# Patient Record
Sex: Female | Born: 1935
Health system: Southern US, Community
[De-identification: ages and names within clinical notes are randomized; demographics above are authoritative.]

## PROBLEM LIST (undated history)

## (undated) DIAGNOSIS — J189 Pneumonia, unspecified organism: Secondary | ICD-10-CM

## (undated) DIAGNOSIS — I4510 Unspecified right bundle-branch block: Secondary | ICD-10-CM

## (undated) DIAGNOSIS — Z9889 Other specified postprocedural states: Secondary | ICD-10-CM

## (undated) DIAGNOSIS — R112 Nausea with vomiting, unspecified: Secondary | ICD-10-CM

## (undated) DIAGNOSIS — M1990 Unspecified osteoarthritis, unspecified site: Secondary | ICD-10-CM

## (undated) DIAGNOSIS — Z923 Personal history of irradiation: Secondary | ICD-10-CM

## (undated) DIAGNOSIS — I4891 Unspecified atrial fibrillation: Secondary | ICD-10-CM

## (undated) DIAGNOSIS — I209 Angina pectoris, unspecified: Secondary | ICD-10-CM

## (undated) DIAGNOSIS — C801 Malignant (primary) neoplasm, unspecified: Secondary | ICD-10-CM

## (undated) DIAGNOSIS — Z9289 Personal history of other medical treatment: Secondary | ICD-10-CM

## (undated) DIAGNOSIS — T4145XA Adverse effect of unspecified anesthetic, initial encounter: Secondary | ICD-10-CM

## (undated) DIAGNOSIS — T148XXA Other injury of unspecified body region, initial encounter: Secondary | ICD-10-CM

## (undated) DIAGNOSIS — K660 Peritoneal adhesions (postprocedural) (postinfection): Secondary | ICD-10-CM

## (undated) DIAGNOSIS — E7800 Pure hypercholesterolemia, unspecified: Secondary | ICD-10-CM

## (undated) DIAGNOSIS — N63 Unspecified lump in unspecified breast: Secondary | ICD-10-CM

## (undated) DIAGNOSIS — C50919 Malignant neoplasm of unspecified site of unspecified female breast: Secondary | ICD-10-CM

## (undated) DIAGNOSIS — T8859XA Other complications of anesthesia, initial encounter: Secondary | ICD-10-CM

## (undated) DIAGNOSIS — R0600 Dyspnea, unspecified: Secondary | ICD-10-CM

## (undated) DIAGNOSIS — Z90710 Acquired absence of both cervix and uterus: Secondary | ICD-10-CM

## (undated) DIAGNOSIS — R141 Gas pain: Secondary | ICD-10-CM

## (undated) DIAGNOSIS — D649 Anemia, unspecified: Secondary | ICD-10-CM

## (undated) DIAGNOSIS — I340 Nonrheumatic mitral (valve) insufficiency: Secondary | ICD-10-CM

## (undated) DIAGNOSIS — E039 Hypothyroidism, unspecified: Secondary | ICD-10-CM

## (undated) DIAGNOSIS — M779 Enthesopathy, unspecified: Secondary | ICD-10-CM

## (undated) DIAGNOSIS — K219 Gastro-esophageal reflux disease without esophagitis: Secondary | ICD-10-CM

## (undated) DIAGNOSIS — E119 Type 2 diabetes mellitus without complications: Secondary | ICD-10-CM

## (undated) DIAGNOSIS — R1013 Epigastric pain: Secondary | ICD-10-CM

## (undated) HISTORY — PX: LIPOMA EXCISION: SHX5283

## (undated) HISTORY — DX: Hypothyroidism, unspecified: E03.9

## (undated) HISTORY — DX: Acquired absence of both cervix and uterus: Z90.710

## (undated) HISTORY — PX: BLADDER REPAIR: SHX76

## (undated) HISTORY — DX: Enthesopathy, unspecified: M77.9

## (undated) HISTORY — PX: ABDOMINAL HYSTERECTOMY: SHX81

## (undated) HISTORY — DX: Unspecified lump in unspecified breast: N63.0

## (undated) HISTORY — DX: Pure hypercholesterolemia, unspecified: E78.00

## (undated) HISTORY — PX: BREAST REDUCTION SURGERY: SHX8

## (undated) HISTORY — DX: Type 2 diabetes mellitus without complications: E11.9

## (undated) HISTORY — DX: Gastro-esophageal reflux disease without esophagitis: K21.9

## (undated) HISTORY — DX: Epigastric pain: R10.13

## (undated) HISTORY — PX: SALPINGOOPHORECTOMY: SHX82

## (undated) HISTORY — DX: Malignant (primary) neoplasm, unspecified: C80.1

## (undated) HISTORY — PX: RETINAL DETACHMENT SURGERY: SHX105

## (undated) HISTORY — DX: Gas pain: R14.1

---
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
## 1976-07-06 DIAGNOSIS — Z90710 Acquired absence of both cervix and uterus: Secondary | ICD-10-CM

## 1976-07-06 HISTORY — PX: APPENDECTOMY: SHX54

## 1976-07-06 HISTORY — DX: Acquired absence of both cervix and uterus: Z90.710

## 1996-07-06 HISTORY — PX: REDUCTION MAMMAPLASTY: SUR839

## 1997-08-30 ENCOUNTER — Ambulatory Visit (HOSPITAL_COMMUNITY): Admission: RE | Admit: 1997-08-30 | Discharge: 1997-08-30 | Payer: Self-pay | Admitting: Plastic Surgery

## 1997-10-12 ENCOUNTER — Emergency Department (HOSPITAL_COMMUNITY): Admission: EM | Admit: 1997-10-12 | Discharge: 1997-10-12 | Payer: Self-pay | Admitting: Emergency Medicine

## 1997-11-01 ENCOUNTER — Other Ambulatory Visit: Admission: RE | Admit: 1997-11-01 | Discharge: 1997-11-01 | Payer: Self-pay | Admitting: Cardiology

## 1998-01-25 ENCOUNTER — Other Ambulatory Visit: Admission: RE | Admit: 1998-01-25 | Discharge: 1998-01-25 | Payer: Self-pay | Admitting: Cardiology

## 1998-08-29 ENCOUNTER — Encounter: Payer: Self-pay | Admitting: Obstetrics & Gynecology

## 1998-08-29 ENCOUNTER — Ambulatory Visit (HOSPITAL_COMMUNITY): Admission: RE | Admit: 1998-08-29 | Discharge: 1998-08-29 | Payer: Self-pay | Admitting: Obstetrics & Gynecology

## 1999-04-07 ENCOUNTER — Other Ambulatory Visit: Admission: RE | Admit: 1999-04-07 | Discharge: 1999-04-07 | Payer: Self-pay | Admitting: Obstetrics & Gynecology

## 1999-09-02 ENCOUNTER — Encounter: Payer: Self-pay | Admitting: Obstetrics & Gynecology

## 1999-09-02 ENCOUNTER — Ambulatory Visit (HOSPITAL_COMMUNITY): Admission: RE | Admit: 1999-09-02 | Discharge: 1999-09-02 | Payer: Self-pay | Admitting: Obstetrics & Gynecology

## 2000-05-14 ENCOUNTER — Other Ambulatory Visit: Admission: RE | Admit: 2000-05-14 | Discharge: 2000-05-14 | Payer: Self-pay | Admitting: Obstetrics & Gynecology

## 2000-09-07 ENCOUNTER — Encounter: Payer: Self-pay | Admitting: Obstetrics & Gynecology

## 2000-09-07 ENCOUNTER — Ambulatory Visit (HOSPITAL_COMMUNITY): Admission: RE | Admit: 2000-09-07 | Discharge: 2000-09-07 | Payer: Self-pay | Admitting: Obstetrics & Gynecology

## 2001-04-07 ENCOUNTER — Ambulatory Visit (HOSPITAL_COMMUNITY): Admission: RE | Admit: 2001-04-07 | Discharge: 2001-04-07 | Payer: Self-pay | Admitting: *Deleted

## 2001-06-22 ENCOUNTER — Other Ambulatory Visit: Admission: RE | Admit: 2001-06-22 | Discharge: 2001-06-22 | Payer: Self-pay | Admitting: Obstetrics & Gynecology

## 2001-09-12 ENCOUNTER — Ambulatory Visit (HOSPITAL_COMMUNITY): Admission: RE | Admit: 2001-09-12 | Discharge: 2001-09-12 | Payer: Self-pay | Admitting: Obstetrics & Gynecology

## 2001-09-12 ENCOUNTER — Encounter: Payer: Self-pay | Admitting: Obstetrics & Gynecology

## 2003-07-30 ENCOUNTER — Other Ambulatory Visit: Admission: RE | Admit: 2003-07-30 | Discharge: 2003-07-30 | Payer: Self-pay | Admitting: Obstetrics & Gynecology

## 2003-08-29 ENCOUNTER — Encounter: Admission: RE | Admit: 2003-08-29 | Discharge: 2003-08-29 | Payer: Self-pay | Admitting: Neurology

## 2003-08-31 ENCOUNTER — Encounter: Admission: RE | Admit: 2003-08-31 | Discharge: 2003-08-31 | Payer: Self-pay | Admitting: Neurology

## 2004-07-31 ENCOUNTER — Encounter: Admission: RE | Admit: 2004-07-31 | Discharge: 2004-07-31 | Payer: Self-pay | Admitting: Obstetrics & Gynecology

## 2004-09-15 ENCOUNTER — Encounter: Admission: RE | Admit: 2004-09-15 | Discharge: 2004-09-15 | Payer: Self-pay | Admitting: Obstetrics & Gynecology

## 2005-03-18 ENCOUNTER — Encounter: Admission: RE | Admit: 2005-03-18 | Discharge: 2005-03-18 | Payer: Self-pay | Admitting: Obstetrics & Gynecology

## 2005-08-13 ENCOUNTER — Other Ambulatory Visit: Admission: RE | Admit: 2005-08-13 | Discharge: 2005-08-13 | Payer: Self-pay | Admitting: Obstetrics & Gynecology

## 2005-09-17 ENCOUNTER — Encounter: Admission: RE | Admit: 2005-09-17 | Discharge: 2005-09-17 | Payer: Self-pay | Admitting: Obstetrics & Gynecology

## 2006-06-03 ENCOUNTER — Encounter: Admission: RE | Admit: 2006-06-03 | Discharge: 2006-06-03 | Payer: Self-pay | Admitting: Cardiology

## 2006-07-13 ENCOUNTER — Ambulatory Visit (HOSPITAL_COMMUNITY): Admission: RE | Admit: 2006-07-13 | Discharge: 2006-07-13 | Payer: Self-pay | Admitting: Gastroenterology

## 2006-10-11 ENCOUNTER — Encounter: Admission: RE | Admit: 2006-10-11 | Discharge: 2006-10-11 | Payer: Self-pay | Admitting: Obstetrics & Gynecology

## 2006-10-19 ENCOUNTER — Encounter: Admission: RE | Admit: 2006-10-19 | Discharge: 2006-10-19 | Payer: Self-pay | Admitting: Obstetrics & Gynecology

## 2007-06-28 ENCOUNTER — Ambulatory Visit (HOSPITAL_COMMUNITY): Admission: RE | Admit: 2007-06-28 | Discharge: 2007-06-28 | Payer: Self-pay | Admitting: Obstetrics & Gynecology

## 2007-10-28 ENCOUNTER — Encounter: Admission: RE | Admit: 2007-10-28 | Discharge: 2007-10-28 | Payer: Self-pay | Admitting: Cardiology

## 2007-10-31 ENCOUNTER — Ambulatory Visit (HOSPITAL_COMMUNITY): Admission: RE | Admit: 2007-10-31 | Discharge: 2007-10-31 | Payer: Self-pay | Admitting: Obstetrics & Gynecology

## 2007-12-20 ENCOUNTER — Encounter: Admission: RE | Admit: 2007-12-20 | Discharge: 2007-12-20 | Payer: Self-pay | Admitting: Cardiology

## 2009-09-06 ENCOUNTER — Encounter: Admission: RE | Admit: 2009-09-06 | Discharge: 2009-09-06 | Payer: Self-pay | Admitting: Cardiology

## 2009-10-25 ENCOUNTER — Emergency Department (HOSPITAL_COMMUNITY): Admission: EM | Admit: 2009-10-25 | Discharge: 2009-10-25 | Payer: Self-pay | Admitting: Emergency Medicine

## 2010-05-09 ENCOUNTER — Ambulatory Visit: Payer: Self-pay | Admitting: Cardiology

## 2010-05-12 ENCOUNTER — Ambulatory Visit: Payer: Self-pay | Admitting: Cardiology

## 2010-07-26 ENCOUNTER — Encounter: Payer: Self-pay | Admitting: Neurology

## 2010-09-09 ENCOUNTER — Emergency Department (INDEPENDENT_AMBULATORY_CARE_PROVIDER_SITE_OTHER): Payer: MEDICARE

## 2010-09-09 ENCOUNTER — Emergency Department (HOSPITAL_BASED_OUTPATIENT_CLINIC_OR_DEPARTMENT_OTHER)
Admission: EM | Admit: 2010-09-09 | Discharge: 2010-09-09 | Disposition: A | Discharge status: Home / Self Care | Payer: MEDICARE | Attending: Emergency Medicine | Admitting: Emergency Medicine

## 2010-09-09 DIAGNOSIS — R109 Unspecified abdominal pain: Secondary | ICD-10-CM

## 2010-09-09 DIAGNOSIS — R1031 Right lower quadrant pain: Secondary | ICD-10-CM | POA: Insufficient documentation

## 2010-09-09 DIAGNOSIS — R319 Hematuria, unspecified: Secondary | ICD-10-CM | POA: Insufficient documentation

## 2010-09-09 DIAGNOSIS — K5730 Diverticulosis of large intestine without perforation or abscess without bleeding: Secondary | ICD-10-CM | POA: Insufficient documentation

## 2010-09-09 DIAGNOSIS — Z79899 Other long term (current) drug therapy: Secondary | ICD-10-CM | POA: Insufficient documentation

## 2010-09-09 DIAGNOSIS — I2510 Atherosclerotic heart disease of native coronary artery without angina pectoris: Secondary | ICD-10-CM | POA: Insufficient documentation

## 2010-09-09 DIAGNOSIS — E7800 Pure hypercholesterolemia, unspecified: Secondary | ICD-10-CM | POA: Insufficient documentation

## 2010-09-09 DIAGNOSIS — R112 Nausea with vomiting, unspecified: Secondary | ICD-10-CM

## 2010-09-09 DIAGNOSIS — N390 Urinary tract infection, site not specified: Secondary | ICD-10-CM | POA: Insufficient documentation

## 2010-09-09 DIAGNOSIS — K449 Diaphragmatic hernia without obstruction or gangrene: Secondary | ICD-10-CM | POA: Insufficient documentation

## 2010-09-09 LAB — CBC
HCT: 38.2 % (ref 36.0–46.0)
MCHC: 35.6 g/dL (ref 30.0–36.0)
Platelets: 133 10*3/uL — ABNORMAL LOW (ref 150–400)
RDW: 12.4 % (ref 11.5–15.5)

## 2010-09-09 LAB — URINE MICROSCOPIC-ADD ON

## 2010-09-09 LAB — COMPREHENSIVE METABOLIC PANEL
BUN: 13 mg/dL (ref 6–23)
Calcium: 9.2 mg/dL (ref 8.4–10.5)
Glucose, Bld: 116 mg/dL — ABNORMAL HIGH (ref 70–99)
Sodium: 143 mEq/L (ref 135–145)
Total Protein: 6.7 g/dL (ref 6.0–8.3)

## 2010-09-09 LAB — DIFFERENTIAL
Basophils Absolute: 0 10*3/uL (ref 0.0–0.1)
Eosinophils Absolute: 0.2 10*3/uL (ref 0.0–0.7)
Eosinophils Relative: 2 % (ref 0–5)
Lymphocytes Relative: 27 % (ref 12–46)
Monocytes Absolute: 0.6 10*3/uL (ref 0.1–1.0)

## 2010-09-09 LAB — URINALYSIS, ROUTINE W REFLEX MICROSCOPIC
Glucose, UA: NEGATIVE mg/dL
Ketones, ur: NEGATIVE mg/dL
Leukocytes, UA: NEGATIVE
Nitrite: NEGATIVE
Specific Gravity, Urine: 1.005 (ref 1.005–1.030)
pH: 6.5 (ref 5.0–8.0)

## 2010-09-09 LAB — LIPASE, BLOOD: Lipase: 96 U/L (ref 23–300)

## 2010-09-09 MED ORDER — IOHEXOL 300 MG/ML  SOLN
100.0000 mL | Freq: Once | INTRAMUSCULAR | Status: AC | PRN
  Administered 2010-09-09: 100 mL via INTRAVENOUS

## 2010-09-23 LAB — CBC
Hemoglobin: 14.4 g/dL (ref 12.0–15.0)
RBC: 4.42 MIL/uL (ref 3.87–5.11)
RDW: 12.7 % (ref 11.5–15.5)

## 2010-09-23 LAB — BASIC METABOLIC PANEL
Calcium: 9.5 mg/dL (ref 8.4–10.5)
GFR calc Af Amer: >60 mL/min (ref >60)
GFR calc non Af Amer: >60 mL/min (ref >60)
Glucose, Bld: 107 mg/dL — ABNORMAL HIGH (ref 70–99)
Sodium: 141 mEq/L (ref 135–145)

## 2010-09-23 LAB — DIFFERENTIAL
Basophils Absolute: 0.1 10*3/uL (ref 0.0–0.1)
Basophils Relative: 1 % (ref 0–1)
Eosinophils Relative: 1 % (ref 0–5)
Lymphocytes Relative: 18 % (ref 12–46)

## 2010-09-23 LAB — CK TOTAL AND CKMB (NOT AT ARMC)
CK, MB: 7 ng/mL (ref 0.3–4.0)
Relative Index: 2.6 — ABNORMAL HIGH (ref 0.0–2.5)

## 2010-09-23 LAB — TROPONIN I: Troponin I: 0.01 ng/mL (ref 0.00–0.06)

## 2010-11-04 ENCOUNTER — Other Ambulatory Visit (INDEPENDENT_AMBULATORY_CARE_PROVIDER_SITE_OTHER): Payer: MEDICARE | Admitting: *Deleted

## 2010-11-04 DIAGNOSIS — Z79899 Other long term (current) drug therapy: Secondary | ICD-10-CM

## 2010-11-04 DIAGNOSIS — E7800 Pure hypercholesterolemia, unspecified: Secondary | ICD-10-CM

## 2010-11-04 LAB — CBC WITH DIFFERENTIAL/PLATELET
Basophils Absolute: 0 10*3/uL (ref 0.0–0.1)
Hemoglobin: 14.1 g/dL (ref 12.0–15.0)
Lymphocytes Relative: 26.4 % (ref 12.0–46.0)
Monocytes Relative: 7.9 % (ref 3.0–12.0)
Neutro Abs: 4.2 10*3/uL (ref 1.4–7.7)
Neutrophils Relative %: 64.4 % (ref 43.0–77.0)
Platelets: 160 10*3/uL (ref 150.0–400.0)
RDW: 13.4 % (ref 11.5–14.6)

## 2010-11-04 LAB — HEPATIC FUNCTION PANEL
ALT: 14 U/L (ref 0–35)
AST: 19 U/L (ref 0–37)
Alkaline Phosphatase: 52 U/L (ref 39–117)
Bilirubin, Direct: 0.2 mg/dL (ref 0.0–0.3)
Total Bilirubin: 0.8 mg/dL (ref 0.3–1.2)
Total Protein: 6.3 g/dL (ref 6.0–8.3)

## 2010-11-04 LAB — BASIC METABOLIC PANEL
CO2: 27 mEq/L (ref 19–32)
Chloride: 109 mEq/L (ref 96–112)
Potassium: 4.8 mEq/L (ref 3.5–5.1)
Sodium: 142 mEq/L (ref 135–145)

## 2010-11-04 LAB — TSH: TSH: 0.05 u[IU]/mL — ABNORMAL LOW (ref 0.35–5.50)

## 2010-11-04 LAB — LIPID PANEL
LDL Cholesterol: 67 mg/dL (ref 0–99)
Total CHOL/HDL Ratio: 3

## 2010-11-05 ENCOUNTER — Other Ambulatory Visit: Payer: MEDICARE | Admitting: *Deleted

## 2010-11-07 ENCOUNTER — Ambulatory Visit: Payer: MEDICARE | Admitting: Cardiology

## 2010-11-27 ENCOUNTER — Encounter: Payer: Self-pay | Admitting: *Deleted

## 2010-11-28 ENCOUNTER — Ambulatory Visit (INDEPENDENT_AMBULATORY_CARE_PROVIDER_SITE_OTHER): Payer: Medicare Other | Admitting: Cardiology

## 2010-11-28 ENCOUNTER — Encounter: Payer: Self-pay | Admitting: Cardiology

## 2010-11-28 VITALS — BP 132/80 | HR 70 | Wt 162.0 lb

## 2010-11-28 DIAGNOSIS — Z780 Asymptomatic menopausal state: Secondary | ICD-10-CM

## 2010-11-28 DIAGNOSIS — E039 Hypothyroidism, unspecified: Secondary | ICD-10-CM | POA: Insufficient documentation

## 2010-11-28 DIAGNOSIS — E7800 Pure hypercholesterolemia, unspecified: Secondary | ICD-10-CM

## 2010-11-28 DIAGNOSIS — R1013 Epigastric pain: Secondary | ICD-10-CM

## 2010-11-28 DIAGNOSIS — M1990 Unspecified osteoarthritis, unspecified site: Secondary | ICD-10-CM | POA: Insufficient documentation

## 2010-11-28 DIAGNOSIS — Z87898 Personal history of other specified conditions: Secondary | ICD-10-CM

## 2010-11-28 NOTE — Progress Notes (Signed)
Julie Norton Date of Birth:  09-Nov-1929 [facility] [address]   [address] [place], [place]  [address] [phone]           Fax   [phone]  History of Present Illness: This pleasant 77 year old woman is seen for a scheduled six-month followup visit.  She has a past history of hypercholesterolemia hypothyroidism and dyspepsia.  She does not have any history of known ischemic heart disease.  She has had a history of atypical chest pain and she had a normal treadmill Cardiolite stress test on 05/06/06.  She had an echocardiogram 09/29/04 which showed normal left ventricular systolic function and normal diastolic function and mild aortic sclerosis with mild mitral regurgitation.  She had a CT scan of the abdomen 06/03/06 which showed a splenorenal shunt that study was done to rule out cause of right chest upper abdominal pain.  As we last saw her she went to the emergency room with right-sided pain and was told that she may have passed a kidney stone and was told that her gallbladder was negative.  Current Outpatient Prescriptions  Medication Sig Dispense Refill  . aspirin 81 MG tablet Take 81 mg by mouth daily.        Marland Kitchen atorvastatin (LIPITOR) 10 MG tablet Take 5 mg by mouth every other day.        . Calcium Carbonate-Vitamin D (CALCIUM 600+D) 600-200 MG-UNIT TABS Take 1 tablet by mouth daily.        . Estropipate (OGEN 0.625 PO) Take 0.625 mg by mouth daily.        Marland Kitchen ezetimibe (ZETIA) 10 MG tablet Take 10 mg by mouth daily.        Marland Kitchen glucosamine-chondroitin 500-400 MG tablet Take 1 tablet by mouth daily.        Marland Kitchen levothyroxine (SYNTHROID, LEVOTHROID) 100 MCG tablet Take 100 mcg by mouth daily.        . nitroGLYCERIN (NITROSTAT) 0.4 MG SL tablet Place 0.4 mg under the tongue every 5 (five) minutes as needed.        Marland Kitchen omeprazole (PRILOSEC) 20 MG capsule Take 20 mg by mouth 2 (two) times daily. Taking only as needed      . DISCONTD: furosemide (LASIX) 20 MG  tablet Take 20 mg by mouth as directed.          Allergies  Allergen Reactions  . Bactrim   . Macrodantin     Patient Active Problem List  Diagnoses  . Hypercholesterolemia  . Hypothyroidism  . Postmenopausal state  . Osteoarthritis  . Dyspepsia    History  Smoking status  . Never Smoker   Smokeless tobacco  . Never Used    History  Alcohol Use     Family History  Problem Relation Age of Onset  . Pneumonia Father   . Heart attack Mother   . Hypertension Mother   . Stroke Mother   . Diabetes Mother   . Aortic aneurysm Mother     abdominal aortic aneurysm  . Aortic aneurysm Sister     abdominal aortic aneurysm    Review of Systems: Constitutional: no fever chills diaphoresis or fatigue or change in weight.  Head and neck: no hearing loss, no epistaxis, no photophobia or visual disturbance. Respiratory: No cough, shortness of breath or wheezing. Cardiovascular: No chest pain peripheral edema, palpitations. Gastrointestinal: No abdominal distention, no abdominal pain, no change in bowel habits hematochezia or melena. Genitourinary: No dysuria, no frequency,  no urgency, no nocturia. Musculoskeletal:No arthralgias, no back pain, no gait disturbance or myalgias. Neurological: No dizziness, no headaches, no numbness, no seizures, no syncope, no weakness, no tremors. Hematologic: No lymphadenopathy, no easy bruising. Psychiatric: No confusion, no hallucinations, no sleep disturbance.    Physical Exam: Filed Vitals:   11/28/10 1405  BP: 132/80  Pulse: 70  The general appearance reveals a well-developed well-nourished woman in no distress.Pupils equal and reactive.   Extraocular Movements are full.  There is no scleral icterus.  The mouth and pharynx are normal.  The neck is supple.  The carotids reveal no bruits.  The jugular venous pressure is normal.  The thyroid is not enlarged.  There is no lymphadenopathy.The chest is clear to percussion and auscultation. There  are no rales or rhonchi. Expansion of the chest is symmetrical.The precordium is quiet.  The first heart sound is normal.  The second heart sound is physiologically split.  There is no murmur gallop rub or click.  There is no abnormal lift or heave.The abdomen is soft and nontender. Bowel sounds are normal. The liver and spleen are not enlarged. There Are no abdominal masses. There are no bruits.The pedal pulses are good.  There is no phlebitis or edema.  There is no cyanosis or clubbing.Strength is normal and symmetrical in all extremities.  There is no lateralizing weakness.  There are no sensory deficits.The skin is warm and dry.  There is no rash.  EKG shows normal sinus rhythm and is within normal limits.  Assessment / Plan: The patient is to continue same medication.  We reviewed her labs and EKG which are normal her return in 6 months for followup visit and fasting lab work

## 2010-11-28 NOTE — Assessment & Plan Note (Signed)
The patient has a history of hypothyroidism.  She presently takes 100 mg of Levothroid every day except one.  Clinically she has remained euthyroid .  Her weight is stable and she's not having any palpitations.

## 2010-11-28 NOTE — Assessment & Plan Note (Addendum)
The patient has a history of hypercholesterolemia.  She is unable to tolerate large doses of statins but he is getting by with a half of a 10 mg LipitorEvery other day and one Zetia every day.  Her blood work is satisfactory and was reviewed with her today.  The patient has not been experiencing any symptoms of ischemic heart disease such as angina pectoris or congestive heart failure.

## 2011-01-02 ENCOUNTER — Other Ambulatory Visit: Payer: Self-pay | Admitting: Obstetrics & Gynecology

## 2011-01-02 DIAGNOSIS — N63 Unspecified lump in unspecified breast: Secondary | ICD-10-CM

## 2011-01-06 ENCOUNTER — Ambulatory Visit
Admission: RE | Admit: 2011-01-06 | Discharge: 2011-01-06 | Disposition: A | Discharge status: Home / Self Care | Payer: Medicare Other | Source: Ambulatory Visit | Attending: Obstetrics & Gynecology | Admitting: Obstetrics & Gynecology

## 2011-01-06 DIAGNOSIS — N63 Unspecified lump in unspecified breast: Secondary | ICD-10-CM

## 2011-01-12 ENCOUNTER — Encounter (INDEPENDENT_AMBULATORY_CARE_PROVIDER_SITE_OTHER): Payer: Self-pay | Admitting: Surgery

## 2011-01-12 ENCOUNTER — Ambulatory Visit (INDEPENDENT_AMBULATORY_CARE_PROVIDER_SITE_OTHER): Payer: Medicare Other | Admitting: Surgery

## 2011-01-12 VITALS — BP 126/82 | HR 72 | Temp 97.5°F | Ht 62.0 in | Wt 161.8 lb

## 2011-01-12 DIAGNOSIS — D0512 Intraductal carcinoma in situ of left breast: Secondary | ICD-10-CM | POA: Insufficient documentation

## 2011-01-12 DIAGNOSIS — N63 Unspecified lump in unspecified breast: Secondary | ICD-10-CM

## 2011-01-12 NOTE — Patient Instructions (Signed)
We will arrange to schedule surgery. Call if any questions or concerns

## 2011-01-13 NOTE — Progress Notes (Signed)
Subjective:     Patient ID: Julie Norton, female   DOB: May 03, 1930, 77 y.o.   MRN: [id number]    BP 126/82  Pulse 72  Temp(Src) 97.5 F (36.4 C) (Temporal)  Ht 5\' 2"  (1.575 m)  Wt 161 lb 12.8 oz (73.392 kg)  BMI 29.59 kg/m2    HPIThe patient presents with a small left breast mass. She was able to feel it. It is not symptomatic. She had a mammogram and ultrasound done. It does not appear definitely malignant. It is too superficial for a needle core biopsy. She came here to discuss excisional biopsy. She has had a prior breast reduction surgery done many years ago.  Review of Systems  Constitutional: Negative.   HENT: Negative.   Respiratory: Negative.   Cardiovascular: Negative.   Gastrointestinal: Negative.   Genitourinary: Negative.   Musculoskeletal: Negative.   Skin: Negative.   Neurological: Negative.   Hematological: Negative.   Psychiatric/Behavioral: Negative.    Allergies  Allergen Reactions  . Bactrim   . Macrodantin    Past Medical History  Diagnosis Date  . H/O: hysterectomy 1978  . Hypercholesteremia   . Hypothyroidism   . Dyspepsia   . Chest pain     intermittent right upper quadrant discomfort radiating around her back      . Tendinitis     of the right arm and shoulder  . Abdominal gas pain     suspected -- may be related to intermittent right upper quadrant discomfort radiating around her back      . Breast mass     left breast   Past Surgical History  Procedure Date  . Tonsillectomy and adenoidectomy 1950    at 77 years of age  . Appendectomy 1978  . Breast reduction surgery   . Bladder repair   . Breast surgery   . Abdominal hysterectomy    History  Substance Use Topics  . Smoking status: Never Smoker   . Smokeless tobacco: Never Used  . Alcohol Use:    Current Outpatient Prescriptions  Medication Sig Dispense Refill  . aspirin 81 MG tablet Take 81 mg by mouth daily.        Marland Kitchen atorvastatin (LIPITOR) 10 MG tablet Take 5 mg by  mouth every other day.        . Calcium Carbonate-Vitamin D (CALCIUM 600+D) 600-200 MG-UNIT TABS Take 1 tablet by mouth daily.        Marland Kitchen ezetimibe (ZETIA) 10 MG tablet Take 10 mg by mouth daily.        Marland Kitchen glucosamine-chondroitin 500-400 MG tablet Take 1 tablet by mouth daily.        Marland Kitchen levothyroxine (SYNTHROID, LEVOTHROID) 100 MCG tablet Take 100 mcg by mouth daily.        Marland Kitchen omeprazole (PRILOSEC) 20 MG capsule Take 20 mg by mouth 2 (two) times daily. Taking only as needed      . pantoprazole (PROTONIX) 40 MG tablet Take 40 mg by mouth daily.        . simvastatin (ZOCOR) 5 MG tablet Take 5 mg by mouth every other day.        . estradiol (ESTRACE) 1 MG tablet       . Estropipate (OGEN 0.625 PO) Take 0.625 mg by mouth daily.        . nitroGLYCERIN (NITROSTAT) 0.4 MG SL tablet Place 0.4 mg under the tongue every 5 (five) minutes as needed.  Objective:   Physical Exam  Constitutional: She is oriented to person, place, and time. She appears well-developed and well-nourished.  HENT:  Head: Normocephalic.  Neck: Normal range of motion. Neck supple. No tracheal deviation present. No thyromegaly present.  Cardiovascular: Normal rate, regular rhythm and normal heart sounds.   Pulmonary/Chest: Effort normal and breath sounds normal. No respiratory distress. She has no wheezes. She has no rales.    Abdominal: Soft. She exhibits no distension and no mass. There is no tenderness. There is no rebound.  Musculoskeletal: Normal range of motion.  Neurological: She is alert and oriented to person, place, and time.  Skin: Skin is warm and dry.  Psychiatric: She has a normal mood and affect. Her behavior is normal. Judgment and thought content normal.       Assessment:     I think this is a likely benign mass. However I think excisional biopsy is appropriate.    Plan:     Will schedule excisional biopsy

## 2011-01-27 ENCOUNTER — Ambulatory Visit
Admission: RE | Admit: 2011-01-27 | Discharge: 2011-01-27 | Disposition: A | Discharge status: Home / Self Care | Payer: Medicare Other | Source: Ambulatory Visit | Attending: Surgery | Admitting: Surgery

## 2011-01-27 ENCOUNTER — Other Ambulatory Visit (INDEPENDENT_AMBULATORY_CARE_PROVIDER_SITE_OTHER): Payer: Self-pay | Admitting: Surgery

## 2011-01-27 ENCOUNTER — Encounter (HOSPITAL_BASED_OUTPATIENT_CLINIC_OR_DEPARTMENT_OTHER)
Admission: RE | Admit: 2011-01-27 | Discharge: 2011-01-27 | Disposition: A | Discharge status: Home / Self Care | Payer: Medicare Other | Source: Ambulatory Visit | Attending: Surgery | Admitting: Surgery

## 2011-01-27 DIAGNOSIS — Z01811 Encounter for preprocedural respiratory examination: Secondary | ICD-10-CM

## 2011-02-01 ENCOUNTER — Other Ambulatory Visit: Payer: Self-pay | Admitting: Cardiology

## 2011-02-02 ENCOUNTER — Ambulatory Visit (HOSPITAL_BASED_OUTPATIENT_CLINIC_OR_DEPARTMENT_OTHER)
Admission: RE | Admit: 2011-02-02 | Discharge: 2011-02-02 | Disposition: A | Discharge status: Home / Self Care | Payer: Medicare Other | Source: Ambulatory Visit | Attending: Surgery | Admitting: Surgery

## 2011-02-02 ENCOUNTER — Other Ambulatory Visit (INDEPENDENT_AMBULATORY_CARE_PROVIDER_SITE_OTHER): Payer: Self-pay | Admitting: Surgery

## 2011-02-02 DIAGNOSIS — K219 Gastro-esophageal reflux disease without esophagitis: Secondary | ICD-10-CM | POA: Insufficient documentation

## 2011-02-02 DIAGNOSIS — D0590 Unspecified type of carcinoma in situ of unspecified breast: Secondary | ICD-10-CM | POA: Insufficient documentation

## 2011-02-02 DIAGNOSIS — Z01812 Encounter for preprocedural laboratory examination: Secondary | ICD-10-CM | POA: Insufficient documentation

## 2011-02-02 LAB — POCT HEMOGLOBIN-HEMACUE: Hemoglobin: 14.9 g/dL (ref 12.0–15.0)

## 2011-02-02 NOTE — Telephone Encounter (Signed)
escribe medication per fax request  

## 2011-02-03 NOTE — Op Note (Signed)
  NAMEKAILIN, PRINCIPATO             ACCOUNT NO.:  [id number]  MEDICAL RECORD NO.:  [id number]  LOCATION:                                 FACILITY:  PHYSICIAN:  Currie Paris, M.D.DATE OF BIRTH:  1929/10/22  DATE OF PROCEDURE:  02/02/2011 DATE OF DISCHARGE:                              OPERATIVE REPORT   PREOPERATIVE DIAGNOSIS:  Left breast mass.  POSTOPERATIVE DIAGNOSIS:  Left breast mass.  PROCEDURE:  Excision of left breast mass.  SURGEON:  Currie Paris, MD  ANESTHESIA:  General (LMA).  CLINICAL HISTORY:  This is a 77 year old lady who presented with a small left breast mass that was subareolar, partially cystic and thought benign, but had some suspicious areas and excision was recommended.  It was too superficial for needle biopsy.  DESCRIPTION OF PROCEDURE:  I saw the patient in the holding area and confirmed the procedure as noted above and marked the left breast.  The patient was taken to the operating room and after satisfactory LMA anesthesia had been obtained the breast was prepped and draped.  The mass was readily palpable at about the 6-5:30 position on the left, about 1 cm from the nipple.  After the time-out was done, I made a curvilinear incision at the areolar margin, elevated the skin, and excised the mass.  It collapsed partially and it was what looked like bloody fluid in it.  I got it out completely intact with a little bit of fatty tissue around it, but it was very superficial and basically I think attached to the one of the ducts of the nipple, but was disconnected from that.  After this was removed, I made sure everything was dry.  I palpated it to make sure there were no additional abnormalities.  I put about 20 mL of 0.25% plain Marcaine in.  I closed it with 3-0 Vicryl, 4-0 Monocryl subcuticular, and Dermabond.  The patient tolerated the procedure well and there were no complications.  All counts were correct.     Currie Paris, M.D.     CJS/MEDQ  D:  02/02/2011  T:  02/02/2011  Job:  [id number]  cc:   Cassell Clement, M.D. [facility]  Electronically Signed by Cyndia Bent M.D. on 02/03/2011 30:86:57 PM

## 2011-02-04 ENCOUNTER — Telehealth (INDEPENDENT_AMBULATORY_CARE_PROVIDER_SITE_OTHER): Payer: Self-pay | Admitting: Surgery

## 2011-02-04 NOTE — Telephone Encounter (Signed)
I spoke with her today about her pathology report. Unfortunately she has DCIS arising in a small intraductal papilloma. The margin was involved.  I discussed the pathology and told her I thought she will need to have a little further excision which which I think is going to involve removing of the nipple-areolar complex.  I will get her in either next week or early the week after to discuss this in more detail.

## 2011-02-17 ENCOUNTER — Ambulatory Visit (INDEPENDENT_AMBULATORY_CARE_PROVIDER_SITE_OTHER): Payer: Medicare Other | Admitting: Surgery

## 2011-02-17 ENCOUNTER — Encounter (INDEPENDENT_AMBULATORY_CARE_PROVIDER_SITE_OTHER): Payer: Self-pay | Admitting: Surgery

## 2011-02-17 VITALS — BP 144/88 | HR 72 | Temp 97.3°F

## 2011-02-17 DIAGNOSIS — D0590 Unspecified type of carcinoma in situ of unspecified breast: Secondary | ICD-10-CM

## 2011-02-17 DIAGNOSIS — D0510 Intraductal carcinoma in situ of unspecified breast: Secondary | ICD-10-CM

## 2011-02-17 NOTE — Progress Notes (Signed)
The patient returns for her first postoperative visit. She is doing well with no particular problems. She's had minimal pain since surgery.  Exam: Breasts: The incision is healing nicely. There is no evidence of infection or problem. Data reviewed: Pathology report shows DCIS. The margin is positive then I think because this was barely subdermal.  Impression: Clinical stage 0 DCIS left breast central.  Plan: I had a discussion with the patient about her diagnosis. This is DCIS arising in a papillomatous lesion. She will need a central partial mastectomy including excision of the nipple-areolar complex.  I went over that with the patient and I discussed the pathology report. She understands that she may need radiation therapy afterwards. She also understands that she may need an estrogen therapy. I've told her to stop taking her estradiol

## 2011-02-17 NOTE — Patient Instructions (Addendum)
We will schedule your surgery a "lumpectomy" to be done as an outpatient - just as the previous surgery Stop taking your estradiol

## 2011-02-24 ENCOUNTER — Ambulatory Visit (HOSPITAL_BASED_OUTPATIENT_CLINIC_OR_DEPARTMENT_OTHER)
Admission: RE | Admit: 2011-02-24 | Discharge: 2011-02-24 | Disposition: A | Discharge status: Home / Self Care | Payer: Medicare Other | Source: Ambulatory Visit | Attending: Surgery | Admitting: Surgery

## 2011-02-24 ENCOUNTER — Other Ambulatory Visit (INDEPENDENT_AMBULATORY_CARE_PROVIDER_SITE_OTHER): Payer: Self-pay | Admitting: Surgery

## 2011-02-24 DIAGNOSIS — D0590 Unspecified type of carcinoma in situ of unspecified breast: Secondary | ICD-10-CM | POA: Insufficient documentation

## 2011-02-24 DIAGNOSIS — Z01812 Encounter for preprocedural laboratory examination: Secondary | ICD-10-CM | POA: Insufficient documentation

## 2011-02-24 DIAGNOSIS — K219 Gastro-esophageal reflux disease without esophagitis: Secondary | ICD-10-CM | POA: Insufficient documentation

## 2011-02-24 DIAGNOSIS — E039 Hypothyroidism, unspecified: Secondary | ICD-10-CM | POA: Insufficient documentation

## 2011-02-24 HISTORY — PX: BREAST LUMPECTOMY: SHX2

## 2011-02-24 LAB — POCT HEMOGLOBIN-HEMACUE: Hemoglobin: 14.5 g/dL (ref 12.0–15.0)

## 2011-02-25 ENCOUNTER — Encounter (INDEPENDENT_AMBULATORY_CARE_PROVIDER_SITE_OTHER): Payer: Medicare Other | Admitting: Surgery

## 2011-02-26 ENCOUNTER — Telehealth (INDEPENDENT_AMBULATORY_CARE_PROVIDER_SITE_OTHER): Payer: Self-pay | Admitting: General Surgery

## 2011-02-26 NOTE — Op Note (Signed)
  [name], Julie Norton             ACCOUNT NO.:  [id number]  MEDICAL RECORD NO.:  [id number]  LOCATION:                                 FACILITY:  PHYSICIAN:  Currie Paris, M.D.DATE OF BIRTH:  11-21-1929  DATE OF PROCEDURE:  02/24/2011 DATE OF DISCHARGE:                              OPERATIVE REPORT   PREOPERATIVE DIAGNOSIS:  Ductal carcinoma in situ, left breast subareolar.  POSTOPERATIVE DIAGNOSIS:  Ductal carcinoma in situ, left breast subareolar.  PROCEDURE:  Left central partial mastectomy.  SURGEON:  Currie Paris, MD  ANESTHESIA:  General.  CLINICAL HISTORY:  This is a 77 year old lady who had an excisional biopsy of a small subareolar mass that was not amenable to a needle biopsy.  It appeared to be a cystic lesion containing blood consistent with a papilloma, but pathology showed DCIS with margin involved since this was basically attached to the nipple.  After discussion with the patient, we elected to proceed to a central partial mastectomy.  DESCRIPTION OF PROCEDURE:  I saw the patient in the holding area and she had no further questions.  I marked the left breast as the operative side.  The patient was taken to the operating room and after satisfactory general endotracheal anesthesia had been obtained the breast was prepped and draped and the time-out was done.  I outlined an elliptical incision taking a minimal amount of skin and going several centimeters medial and lateral to the areolar margin.  I divided about 2.5-3 cm deep into the breast tissue along the entire incision length and then came underneath this.  I did not go all the way of the chest wall because this initial biopsy had been very superficial and really appeared to be DCIS arising in a papilloma that was confined to the subareolar area.  Once everything was out, I put the clips and marked the margins.  I put 20 mL of 0.25% Marcaine in to help with postop pain relief.  I  closed the incision in several layers with 3-0 Vicryl, 4-0 Monocryl subcuticular plus Dermabond.  The patient tolerated the procedure well.  There were no operative complications.  All counts were correct.     Currie Paris, M.D.     CJS/MEDQ  D:  02/24/2011  T:  02/24/2011  Job:  [id number]  Electronically Signed by Cyndia Bent M.D. on 02/26/2011 07:20:54 AM

## 2011-02-26 NOTE — Telephone Encounter (Signed)
Spoke with patient. Made her aware that her margins are clear. Patient will follow up on 03/13/11. I explained that 2 1/2 weeks is normal for a follow up, since I received a message from her daughter yesterday wanting to move sooner. She will call with any questions prior to follow up.

## 2011-02-26 NOTE — Telephone Encounter (Signed)
Message copied by Liliana Cline on Thu Feb 26, 2011  9:43 AM ------      Message from: Currie Paris      Created: Wed Feb 25, 2011  6:28 PM       Tell the patient that her margins are OK . I will discuss in detail in the office.

## 2011-03-12 ENCOUNTER — Encounter (INDEPENDENT_AMBULATORY_CARE_PROVIDER_SITE_OTHER): Payer: Self-pay | Admitting: General Surgery

## 2011-03-13 ENCOUNTER — Ambulatory Visit (INDEPENDENT_AMBULATORY_CARE_PROVIDER_SITE_OTHER): Payer: Medicare Other | Admitting: Surgery

## 2011-03-13 ENCOUNTER — Encounter (INDEPENDENT_AMBULATORY_CARE_PROVIDER_SITE_OTHER): Payer: Self-pay | Admitting: Surgery

## 2011-03-13 VITALS — BP 125/85 | HR 71

## 2011-03-13 DIAGNOSIS — C50919 Malignant neoplasm of unspecified site of unspecified female breast: Secondary | ICD-10-CM

## 2011-03-13 DIAGNOSIS — Z9889 Other specified postprocedural states: Secondary | ICD-10-CM

## 2011-03-13 NOTE — Progress Notes (Signed)
NAME: Julie Norton                                            DOB: 09/01/1929 DATE: 03/13/2011                                                  MRN: [id number]  CC: Post op lumpectomy  HPI: This patient comes in for post op follow-up.Sheunderwent Left central partial mastectomy on 8/21. She feels that she is doing well.  PE: General: The patient appears to be healthy, NAD  DATA REVIEWED: No new data   IMPRESSION: The patient is doing well S/P partial mastectomy for DCIS .    PLAN: I will see her in three months. For some reason oncology appointments have not been made so will do so now.

## 2011-03-13 NOTE — Progress Notes (Signed)
Addended by: Wilder Glade on: 03/13/2011 09:05 AM   Modules accepted: Orders

## 2011-03-24 ENCOUNTER — Encounter (HOSPITAL_BASED_OUTPATIENT_CLINIC_OR_DEPARTMENT_OTHER): Payer: Medicare Other | Admitting: Oncology

## 2011-03-24 ENCOUNTER — Other Ambulatory Visit: Payer: Self-pay | Admitting: Oncology

## 2011-03-24 DIAGNOSIS — D0590 Unspecified type of carcinoma in situ of unspecified breast: Secondary | ICD-10-CM

## 2011-03-24 DIAGNOSIS — Z170 Estrogen receptor positive status [ER+]: Secondary | ICD-10-CM

## 2011-03-24 LAB — CBC WITH DIFFERENTIAL/PLATELET
Basophils Absolute: 0 10*3/uL (ref 0.0–0.1)
EOS%: 2.4 % (ref 0.0–7.0)
HGB: 13.8 g/dL (ref 11.6–15.9)
LYMPH%: 28.8 % (ref 14.0–49.7)
MCH: 31.1 pg (ref 25.1–34.0)
MCV: 87.9 fL (ref 79.5–101.0)
MONO%: 8.4 % (ref 0.0–14.0)
Platelets: 149 10*3/uL (ref 145–400)
RDW: 13.3 % (ref 11.2–14.5)

## 2011-03-24 LAB — COMPREHENSIVE METABOLIC PANEL
AST: 17 U/L (ref 0–37)
Alkaline Phosphatase: 56 U/L (ref 39–117)
BUN: 13 mg/dL (ref 6–23)
Creatinine, Ser: 0.8 mg/dL (ref 0.50–1.10)
Glucose, Bld: 187 mg/dL — ABNORMAL HIGH (ref 70–99)
Potassium: 4.1 mEq/L (ref 3.5–5.3)
Total Bilirubin: 0.6 mg/dL (ref 0.3–1.2)

## 2011-03-30 ENCOUNTER — Ambulatory Visit
Admission: RE | Admit: 2011-03-30 | Discharge: 2011-03-30 | Disposition: A | Discharge status: Home / Self Care | Payer: Medicare Other | Source: Ambulatory Visit | Attending: Radiation Oncology | Admitting: Radiation Oncology

## 2011-03-30 DIAGNOSIS — Z510 Encounter for antineoplastic radiation therapy: Secondary | ICD-10-CM | POA: Insufficient documentation

## 2011-03-30 DIAGNOSIS — E039 Hypothyroidism, unspecified: Secondary | ICD-10-CM | POA: Insufficient documentation

## 2011-03-30 DIAGNOSIS — L539 Erythematous condition, unspecified: Secondary | ICD-10-CM | POA: Insufficient documentation

## 2011-03-30 DIAGNOSIS — Z7982 Long term (current) use of aspirin: Secondary | ICD-10-CM | POA: Insufficient documentation

## 2011-03-30 DIAGNOSIS — Z79899 Other long term (current) drug therapy: Secondary | ICD-10-CM | POA: Insufficient documentation

## 2011-03-30 DIAGNOSIS — D0590 Unspecified type of carcinoma in situ of unspecified breast: Secondary | ICD-10-CM | POA: Insufficient documentation

## 2011-03-30 DIAGNOSIS — K219 Gastro-esophageal reflux disease without esophagitis: Secondary | ICD-10-CM | POA: Insufficient documentation

## 2011-03-30 DIAGNOSIS — Z90710 Acquired absence of both cervix and uterus: Secondary | ICD-10-CM | POA: Insufficient documentation

## 2011-03-30 DIAGNOSIS — Z9079 Acquired absence of other genital organ(s): Secondary | ICD-10-CM | POA: Insufficient documentation

## 2011-03-30 DIAGNOSIS — E785 Hyperlipidemia, unspecified: Secondary | ICD-10-CM | POA: Insufficient documentation

## 2011-04-13 ENCOUNTER — Telehealth: Payer: Self-pay | Admitting: Cardiology

## 2011-04-13 DIAGNOSIS — E785 Hyperlipidemia, unspecified: Secondary | ICD-10-CM

## 2011-04-13 DIAGNOSIS — E039 Hypothyroidism, unspecified: Secondary | ICD-10-CM

## 2011-04-13 NOTE — Telephone Encounter (Signed)
Please call pt about appt she needs to make

## 2011-04-14 NOTE — Telephone Encounter (Signed)
Scheduled appointment with labs for patient

## 2011-04-24 ENCOUNTER — Encounter: Payer: Medicare Other | Admitting: Oncology

## 2011-04-24 ENCOUNTER — Encounter (HOSPITAL_BASED_OUTPATIENT_CLINIC_OR_DEPARTMENT_OTHER): Payer: Medicare Other | Admitting: Oncology

## 2011-04-24 DIAGNOSIS — C50119 Malignant neoplasm of central portion of unspecified female breast: Secondary | ICD-10-CM

## 2011-04-24 DIAGNOSIS — D0590 Unspecified type of carcinoma in situ of unspecified breast: Secondary | ICD-10-CM

## 2011-05-01 ENCOUNTER — Other Ambulatory Visit (INDEPENDENT_AMBULATORY_CARE_PROVIDER_SITE_OTHER): Payer: Medicare Other | Admitting: *Deleted

## 2011-05-01 DIAGNOSIS — E785 Hyperlipidemia, unspecified: Secondary | ICD-10-CM

## 2011-05-01 DIAGNOSIS — E039 Hypothyroidism, unspecified: Secondary | ICD-10-CM

## 2011-05-01 LAB — LIPID PANEL
Cholesterol: 144 mg/dL (ref 0–200)
HDL: 50.6 mg/dL
LDL Cholesterol: 73 mg/dL (ref 0–99)
Total CHOL/HDL Ratio: 3
Triglycerides: 104 mg/dL (ref 0.0–149.0)
VLDL: 20.8 mg/dL (ref 0.0–40.0)

## 2011-05-01 LAB — HEPATIC FUNCTION PANEL
ALT: 17 U/L (ref 0–35)
AST: 25 U/L (ref 0–37)
Alkaline Phosphatase: 57 U/L (ref 39–117)
Bilirubin, Direct: 0.2 mg/dL (ref 0.0–0.3)
Total Bilirubin: 1.1 mg/dL (ref 0.3–1.2)
Total Protein: 6.8 g/dL (ref 6.0–8.3)

## 2011-05-01 LAB — BASIC METABOLIC PANEL
CO2: 25 mEq/L (ref 19–32)
Chloride: 110 mEq/L (ref 96–112)
Potassium: 4.2 mEq/L (ref 3.5–5.1)

## 2011-05-01 LAB — T4, FREE: Free T4: 1.15 ng/dL (ref 0.60–1.60)

## 2011-05-01 LAB — TSH: TSH: 0.06 u[IU]/mL — ABNORMAL LOW (ref 0.35–5.50)

## 2011-05-05 ENCOUNTER — Encounter: Payer: Self-pay | Admitting: Cardiology

## 2011-05-05 ENCOUNTER — Ambulatory Visit (INDEPENDENT_AMBULATORY_CARE_PROVIDER_SITE_OTHER): Payer: Medicare Other | Admitting: Cardiology

## 2011-05-05 VITALS — BP 136/78 | HR 78 | Ht 62.0 in | Wt 160.1 lb

## 2011-05-05 DIAGNOSIS — K3189 Other diseases of stomach and duodenum: Secondary | ICD-10-CM

## 2011-05-05 DIAGNOSIS — R1013 Epigastric pain: Secondary | ICD-10-CM

## 2011-05-05 DIAGNOSIS — E7800 Pure hypercholesterolemia, unspecified: Secondary | ICD-10-CM

## 2011-05-05 DIAGNOSIS — C50919 Malignant neoplasm of unspecified site of unspecified female breast: Secondary | ICD-10-CM

## 2011-05-05 MED ORDER — ATORVASTATIN CALCIUM 10 MG PO TABS: 5.0000 mg | ORAL_TABLET | ORAL | Status: DC

## 2011-05-05 NOTE — Assessment & Plan Note (Signed)
The patient has not been expressing any recent symptoms of dyspepsia.

## 2011-05-05 NOTE — Patient Instructions (Signed)
Called [facility] and changed your Lipitor to generic  Your physician recommends that you continue on your current medications as directed. Please refer to the Current Medication list given to you today.  Your physician wants you to follow-up in: 6 months with fasting labs You will receive a reminder letter in the mail two months in advance. If you don't receive a letter, please call our office to schedule the follow-up appointment.

## 2011-05-05 NOTE — Assessment & Plan Note (Signed)
Patient has a history of hypercholesterolemia.  She is on atorvastatin and ezetimibe.  She's not had any side effects from the statin therapy.  Her recent lipids are satisfactory.  She has not been as careful a diet recently because of going through radiotherapy and has been advised by her physicians to ensure proper nutrition and adequate calories.

## 2011-05-05 NOTE — Progress Notes (Signed)
Julie Norton Date of Birth:  [birth year] [facility] [address]   [address] [place], [place]  [address] [phone]           Fax   [phone]  History of Present Illness: This pleasant 77 year old woman is seen for a scheduled six-month followup office visit.  Since we last saw her.  She was found to have breast cancer of the left breast.  She had actually felt something in her breast in October of 2011, but did not think it was significant.  When she saw her gynecologist in June 2012.  He sent her for a mammogram.  This led to a biopsy followed by a larger excisional biopsy and she is now completing a course of radiation therapy.  She has been advised to take antiestrogen therapy for the next 5 years.  The radiation therapy has caused her to feel fatigued.  She has not been experiencing any new cardiac symptoms.  She has a past history of hypercholesterolemia, hypothyroidism, and dyspepsia.  She does not have any history of known ischemic heart disease.  She had a episode of atypical chest pain, followed by a normal treadmill Cardiolite stress test in 2007.  She had an echocardiogram in 2006 showing normal systolic and normal diastolic function and mild aortic sclerosis and mild mitral regurgitation.  She had a CT scan of the abdomen in 2007, which showed a spleno renal shunt.  That study was done in an evaluation of right upper abdominal discomfort.  Current Outpatient Prescriptions  Medication Sig Dispense Refill  . atorvastatin (LIPITOR) 10 MG tablet Take 0.5 tablets (5 mg total) by mouth every other day.  90 tablet  3  . Calcium Carbonate-Vitamin D (CALCIUM 600+D) 600-200 MG-UNIT TABS Take 1 tablet by mouth daily.        . Cholecalciferol (VITAMIN D PO) Take by mouth.        . ezetimibe (ZETIA) 10 MG tablet Take 10 mg by mouth daily.        Marland Kitchen glucosamine-chondroitin 500-400 MG tablet Take 1 tablet by mouth daily.        Marland Kitchen levothyroxine (SYNTHROID,  LEVOTHROID) 100 MCG tablet TAKE 1 TABLET BY MOUTH EVERY DAY 6 OF 7 DAYS  30 tablet  11  . omeprazole (PRILOSEC) 20 MG capsule Take 20 mg by mouth 2 (two) times daily. Taking only as needed        Allergies  Allergen Reactions  . Bactrim   . Macrodantin     Patient Active Problem List  Diagnoses  . Hypercholesterolemia  . Hypothyroidism  . Postmenopausal state  . Osteoarthritis  . Dyspepsia  . DCIS, left, central, receptor +    History  Smoking status  . Never Smoker   Smokeless tobacco  . Never Used    History  Alcohol Use No    Family History  Problem Relation Age of Onset  . Pneumonia Father   . Heart attack Mother   . Hypertension Mother   . Stroke Mother   . Diabetes Mother   . Aortic aneurysm Mother     abdominal aortic aneurysm  . Aortic aneurysm Sister     abdominal aortic aneurysm    Review of Systems: Constitutional: no fever chills diaphoresis or fatigue or change in weight.  Head and neck: no hearing loss, no epistaxis, no photophobia or visual disturbance. Respiratory: No cough, shortness of breath or wheezing. Cardiovascular: No chest pain peripheral edema, palpitations. Gastrointestinal: No  abdominal distention, no abdominal pain, no change in bowel habits hematochezia or melena. Genitourinary: No dysuria, no frequency, no urgency, no nocturia. Musculoskeletal:No arthralgias, no back pain, no gait disturbance or myalgias. Neurological: No dizziness, no headaches, no numbness, no seizures, no syncope, no weakness, no tremors. Hematologic: No lymphadenopathy, no easy bruising. Psychiatric: No confusion, no hallucinations, no sleep disturbance.    Physical Exam: Filed Vitals:   05/05/11 0912  BP: 136/78  Pulse: 78   Gen. appearance reveals a well-developed, well-nourished woman in no distress.Pupils equal and reactive.   Extraocular Movements are full.  There is no scleral icterus.  The mouth and pharynx are normal.  The neck is supple.  The  carotids reveal no bruits.  The jugular venous pressure is normal.  The thyroid is not enlarged.  There is no lymphadenopathy.  The chest is clear to percussion and auscultation. There are no rales or rhonchi. Expansion of the chest is symmetrical.  The precordium is quiet.  The first heart sound is normal.  The second heart sound is physiologically split.  There is no murmur gallop rub or click.  There is no abnormal lift or heave.  The breasts were not examinedThe abdomen is soft and nontender. Bowel sounds are normal. The liver and spleen are not enlarged. There Are no abdominal masses. There are no bruits.  The pedal pulses are good.  There is no phlebitis or edema.  There is no cyanosis or clubbing. Strength is normal and symmetrical in all extremities.  There is no lateralizing weakness.  There are no sensory deficits.  The skin is warm and dry.  There is no rash.   Assessment / Plan:  Continue same medication.  We are changing Lipitor to generic.  She will be having cataract surgery by Dr. Vonna Kotyk in the near future.  We'll see her in 6 months for office visit and fasting lab.

## 2011-05-05 NOTE — Assessment & Plan Note (Signed)
The patient is under the care of Dr. Welton Flakes , for oncology.  Dr. Jamey Ripa was her surgeon.  So far, the patient has tolerated the radiotherapy with only mild degrees of malaise, and fatigue.

## 2011-06-10 ENCOUNTER — Encounter: Payer: Self-pay | Admitting: Radiation Oncology

## 2011-06-12 ENCOUNTER — Ambulatory Visit
Admission: RE | Admit: 2011-06-12 | Discharge: 2011-06-12 | Disposition: A | Discharge status: Home / Self Care | Payer: Medicare Other | Source: Ambulatory Visit | Attending: Radiation Oncology | Admitting: Radiation Oncology

## 2011-06-12 ENCOUNTER — Encounter: Payer: Self-pay | Admitting: Radiation Oncology

## 2011-06-12 DIAGNOSIS — C50919 Malignant neoplasm of unspecified site of unspecified female breast: Secondary | ICD-10-CM

## 2011-06-12 NOTE — Progress Notes (Signed)
Patient presents to the clinic today accompanied by her husband for a follow up appointment with Dr. Mitzi Hansen s/p left breast xrt. Patient is alert and oriented to person, place, and time. No distress noted. Steady gait noted. Pleasant affect noted. Patient denies pain at this time. Patient reports last Friday she had cataract surgery on her left and is scheduled to have her right eye done 12/17. Patient complains of feeling bloated and intermittent episodes of constipation. Patient had numerous questions related to late side effects of radiation. All questions answered. Reported all findings to Dr. Mitzi Hansen.

## 2011-06-12 NOTE — Progress Notes (Signed)
CC:   Julie Norton, M.D. Julie Norton, M.D.  DIAGNOSIS:  Ductal carcinoma in situ of the left breast.  INTERVAL HISTORY:  Julie Norton returns to clinic today for followup after having completed her course of adjuvant radiotherapy to the left breast on 05/08/2011.  The patient indicates that she has done quite well since she finished treatment.  She is pleased with how her skin has done and her energy level has been improving.  PHYSICAL EXAM:  The skin in the treatment area looks excellent without any significant erythema or ongoing desquamation.  Only slight hyperpigmentation present.  IMPRESSION AND PLAN:  Julie Norton is doing very well from her radiation treatment with no ongoing issues.  We will have her return to our clinic on a p.r.n. basis.    ______________________________ Radene Gunning, M.D., Ph.D. JSM/MEDQ  D:  06/12/2011  T:  06/12/2011  Job:  [id number]

## 2011-06-15 ENCOUNTER — Other Ambulatory Visit: Payer: Self-pay | Admitting: Oncology

## 2011-06-15 ENCOUNTER — Other Ambulatory Visit (HOSPITAL_BASED_OUTPATIENT_CLINIC_OR_DEPARTMENT_OTHER): Payer: Medicare Other | Admitting: Lab

## 2011-06-15 ENCOUNTER — Ambulatory Visit (HOSPITAL_BASED_OUTPATIENT_CLINIC_OR_DEPARTMENT_OTHER): Payer: Medicare Other | Admitting: Oncology

## 2011-06-15 ENCOUNTER — Encounter: Payer: Self-pay | Admitting: Oncology

## 2011-06-15 ENCOUNTER — Telehealth: Payer: Self-pay | Admitting: Oncology

## 2011-06-15 VITALS — BP 121/72 | HR 69 | Temp 97.9°F | Ht 61.5 in | Wt 160.9 lb

## 2011-06-15 DIAGNOSIS — D0510 Intraductal carcinoma in situ of unspecified breast: Secondary | ICD-10-CM

## 2011-06-15 DIAGNOSIS — C50119 Malignant neoplasm of central portion of unspecified female breast: Secondary | ICD-10-CM

## 2011-06-15 DIAGNOSIS — D0590 Unspecified type of carcinoma in situ of unspecified breast: Secondary | ICD-10-CM

## 2011-06-15 DIAGNOSIS — Z170 Estrogen receptor positive status [ER+]: Secondary | ICD-10-CM

## 2011-06-15 LAB — COMPREHENSIVE METABOLIC PANEL
ALT: 14 U/L (ref 0–35)
CO2: 27 mEq/L (ref 19–32)
Calcium: 9.4 mg/dL (ref 8.4–10.5)
Chloride: 108 mEq/L (ref 96–112)
Creatinine, Ser: 0.75 mg/dL (ref 0.50–1.10)
Glucose, Bld: 149 mg/dL — ABNORMAL HIGH (ref 70–99)
Total Bilirubin: 0.9 mg/dL (ref 0.3–1.2)
Total Protein: 5.9 g/dL — ABNORMAL LOW (ref 6.0–8.3)

## 2011-06-15 LAB — CBC WITH DIFFERENTIAL/PLATELET
BASO%: 0.4 % (ref 0.0–2.0)
Basophils Absolute: 0 10*3/uL (ref 0.0–0.1)
Eosinophils Absolute: 0.1 10*3/uL (ref 0.0–0.5)
HCT: 38.1 % (ref 34.8–46.6)
HGB: 13.6 g/dL (ref 11.6–15.9)
LYMPH%: 24.2 % (ref 14.0–49.7)
MCHC: 35.7 g/dL (ref 31.5–36.0)
MONO#: 0.5 10*3/uL (ref 0.1–0.9)
NEUT#: 3 10*3/uL (ref 1.5–6.5)
NEUT%: 62.7 % (ref 38.4–76.8)
Platelets: 145 10*3/uL (ref 145–400)
WBC: 4.8 10*3/uL (ref 3.9–10.3)
lymph#: 1.2 10*3/uL (ref 0.9–3.3)

## 2011-06-15 MED ORDER — EXEMESTANE 25 MG PO TABS: 25.0000 mg | ORAL_TABLET | Freq: Every day | ORAL | Status: AC

## 2011-06-15 NOTE — Telephone Encounter (Signed)
Gv pt appt for march2013 °

## 2011-06-15 NOTE — Patient Instructions (Signed)
Exemestane tablets What is this medicine? EXEMESTANE (ex e MES tane) blocks the production of the hormone estrogen. Some types of breast cancer depend on estrogen to grow, and this medicine can stop tumor growth by blocking estrogen production. This medicine is for the treatment of breast cancer in postmenopausal women only. This medicine may be used for other purposes; ask your health care provider or pharmacist if you have questions. What should I tell my health care provider before I take this medicine? They need to know if you have any of these conditions: -an unusual or allergic reaction to exemestane, other medicines, foods, dyes, or preservatives -pregnant or trying to get pregnant -breast-feeding How should I use this medicine? Take this medicine by mouth with a glass of water. Follow the directions on the prescription label. Take your doses at regular intervals after a meal. Do not take your medicine more often than directed. Do not stop taking except on the advice of your doctor or health care professional. Contact your pediatrician regarding the use of this medicine in children. Special care may be needed. Overdosage: If you think you have taken too much of this medicine contact a poison control center or emergency room at once. NOTE: This medicine is only for you. Do not share this medicine with others. What if I miss a dose? If you miss a dose, take the next dose as usual. Do not try to make up the missed dose. Do not take double or extra doses. What may interact with this medicine? Do not take this medicine with any of the following medications: -female hormones, like estrogens and birth control pills This medicine may also interact with the following medications: -androstenedione -phenytoin -rifabutin, rifampin, or rifapentine -St. John's Wort This list may not describe all possible interactions. Give your health care provider a list of all the medicines, herbs,  non-prescription drugs, or dietary supplements you use. Also tell them if you smoke, drink alcohol, or use illegal drugs. Some items may interact with your medicine. What should I watch for while using this medicine? Visit your doctor or health care professional for regular checks on your progress. If you experience hot flashes or sweating while taking this medicine, avoid alcohol, smoking and drinks with caffeine. This may help to decrease these side effects. What side effects may I notice from receiving this medicine? Side effects that you should report to your doctor or health care professional as soon as possible: -any new or unusual symptoms -changes in vision -fever -leg or arm swelling -pain in bones, joints, or muscles -pain in hips, back, ribs, arms, shoulders, or legs Side effects that usually do not require medical attention (report to your doctor or health care professional if they continue or are bothersome): -difficulty sleeping -headache -hot flashes -sweating -unusually weak or tired This list may not describe all possible side effects. Call your doctor for medical advice about side effects. You may report side effects to FDA at 1-800-FDA-1088. Where should I keep my medicine? Keep out of the reach of children. Store at room temperature between 15 and 30 degrees C (59 and 86 degrees F). Throw away any unused medicine after the expiration date. NOTE: This sheet is a summary. It may not cover all possible information. If you have questions about this medicine, talk to your doctor, pharmacist, or health care provider.  2012, Elsevier/Gold Standard. (10/25/2007 11:48:29 AM)  Lab Results  Component Value Date   WBC 4.8 06/15/2011   HGB 13.6 06/15/2011  HCT 38.1 06/15/2011   MCV 89.6 06/15/2011   PLT 145 06/15/2011

## 2011-06-15 NOTE — Progress Notes (Signed)
OFFICE PROGRESS NOTE  CC Dr. Cyndia Bent  Dr. Varney Baas  Cassell Clement, MD, MD [address] [place] [address]  DIAGNOSIS: 77 year old female with ductal carcinoma in situ of the left breast status post central lumpectomy on 02/24/2011  PRIOR THERAPY:  #1 patient underwent a central lumpectomy with removal of the left nipple areola complex on 02/24/2011. The final pathology revealed intermediate to high-grade ductal carcinoma in situ all final margins were negative. Tumor was as she receptor +99% progesterone receptor +100%.  #2 she was then seen by Dr. Dorothy Puffer and underwent radiation therapy on 04/14/2011 and completed on 05/08/2011.  #3 patient has had cataract extraction of the right eye and she will have her second eye done on June 22, 2011  CURRENT THERAPY: Patient to begin Aromasin 25 mg daily after she has her cataract extraction completed.  INTERVAL HISTORY: AINO HECKERT 77 y.o. female returns for followup visit today. Overall she is doing well she tolerated the cataract extraction very well without any significant complaints. She has healed well from the surgical scar on the left breast as well as the radiation therapy. She today he is denying any fevers chills night sweats headaches shortness of breath chest pains palpitations she has no myalgias or arthralgias she has no hot flashes she has no pain in her breasts. No hematuria hematochezia melena hemoptysis hematemesis. Remainder of the 10 point review of systems is negative  MEDICAL HISTORY: Past Medical History  Diagnosis Date  . H/O: hysterectomy 1978  . Hypercholesteremia   . Hypothyroidism   . Dyspepsia   . Chest pain     intermittent right upper quadrant discomfort radiating around her back      . Tendinitis     of the right arm and shoulder  . Abdominal gas pain     suspected -- may be related to intermittent right upper quadrant discomfort  radiating around her back      . Breast mass     left breast  . GERD (gastroesophageal reflux disease)     ALLERGIES:  is allergic to bactrim; hydrocodone; and macrodantin.  MEDICATIONS:  Current Outpatient Prescriptions  Medication Sig Dispense Refill  . atorvastatin (LIPITOR) 10 MG tablet Take 0.5 tablets (5 mg total) by mouth every other day.  90 tablet  3  . Calcium Carbonate-Vitamin D (CALCIUM 600+D) 600-200 MG-UNIT TABS Take 2 tablets by mouth daily.       . Cholecalciferol (VITAMIN D PO) Take by mouth.        Marland Kitchen exemestane (AROMASIN) 25 MG tablet Take 1 tablet (25 mg total) by mouth daily after breakfast.  30 tablet  12  . ezetimibe (ZETIA) 10 MG tablet Take 10 mg by mouth daily.        Marland Kitchen glucosamine-chondroitin 500-400 MG tablet Take 2 tablets by mouth daily.       Marland Kitchen levothyroxine (SYNTHROID, LEVOTHROID) 100 MCG tablet TAKE 1 TABLET BY MOUTH EVERY DAY 6 OF 7 DAYS  30 tablet  11  . omeprazole (PRILOSEC) 20 MG capsule Take 20 mg by mouth 2 (two) times daily. Taking only as needed        SURGICAL HISTORY:  Past Surgical History  Procedure Date  . Tonsillectomy and adenoidectomy 1950    at 77 years of age  . Appendectomy 1978  . Breast reduction surgery   . Bladder repair   . Abdominal hysterectomy   . Lipoma excision   .  Breast lumpectomy 02/24/2011    central partial mastectomy - dr Jamey Ripa    REVIEW OF SYSTEMS:  Pertinent items are noted in HPI.   PHYSICAL EXAMINATION: General appearance: alert, cooperative and appears stated age Neck: no adenopathy, no carotid bruit, no JVD, supple, symmetrical, trachea midline and thyroid not enlarged, symmetric, no tenderness/mass/nodules Lymph nodes: Cervical, supraclavicular, and axillary nodes normal. Resp: clear to auscultation bilaterally and normal percussion bilaterally Back: symmetric, no curvature. ROM normal. No CVA tenderness. Cardio: regular rate and rhythm, S1, S2 normal, no murmur, click, rub or gallop and normal apical  impulse GI: soft, non-tender; bowel sounds normal; no masses,  no organomegaly Extremities: extremities normal, atraumatic, no cyanosis or edema Neurologic: Grossly normal Bilateral breasts are examined right breast no masses nipple discharge no skin changes. Left breast reveals a central lumpectomy scar there is no masses no skin changes.  ECOG PERFORMANCE STATUS: 1 - Symptomatic but completely ambulatory  Blood pressure 121/72, pulse 69, temperature 97.9 F (36.6 C), temperature source Oral, height 5' 1.5" (1.562 m), weight 160 lb 14.4 oz (72.984 kg).  LABORATORY DATA: Lab Results  Component Value Date   WBC 4.8 06/15/2011   HGB 13.6 06/15/2011   HCT 38.1 06/15/2011   MCV 89.6 06/15/2011   PLT 145 06/15/2011      Chemistry      Component Value Date/Time   NA 143 05/01/2011 0935   K 4.2 05/01/2011 0935   CL 110 05/01/2011 0935   CO2 25 05/01/2011 0935   BUN 14 05/01/2011 0935   CREATININE 0.8 05/01/2011 0935      Component Value Date/Time   CALCIUM 9.1 05/01/2011 0935   ALKPHOS 57 05/01/2011 0935   AST 25 05/01/2011 0935   ALT 17 05/01/2011 0935   BILITOT 1.1 05/01/2011 0935       RADIOGRAPHIC STUDIES:  No results found.  ASSESSMENT: 77 year old female with ductal carcinoma in situ of the left breast status post central lumpectomy 02/24/2011 followed by radiation therapy which he completed on 05/08/2011. Overall she has tolerated her breast surgery and radiation very well   PLAN: Patient and I discussed antiestrogen therapy as a chemopreventive for development of another breast cancer. She realizes that she has a stage 0 malignancy currently. She had is free of disease. And all any strategies that we half from here on forward our prevention. Risks and benefits of aromasin are discussed with her literature was given to her. And she was sent a prescription to her pharmacy.  Patient will be seen back in 3 months time for followup however if she starts having trouble  from the aromasin then she is to call us sooner so we can get her in sooner.  All questions were answered. The patient knows to call the clinic with any problems, questions or concerns. We can certainly see the patient much sooner if necessary.  I spent 20 minutes counseling the patient face to face. The total time spent in the appointment was 30 minutes.    Drue Second, MD Medical/Oncology [facility] [phone] (beeper) [phone] (Office)  06/15/2011, 11:36 AM

## 2011-06-16 ENCOUNTER — Encounter (INDEPENDENT_AMBULATORY_CARE_PROVIDER_SITE_OTHER): Payer: Self-pay | Admitting: Surgery

## 2011-06-16 ENCOUNTER — Ambulatory Visit (INDEPENDENT_AMBULATORY_CARE_PROVIDER_SITE_OTHER): Payer: Medicare Other | Admitting: Surgery

## 2011-06-16 DIAGNOSIS — D0590 Unspecified type of carcinoma in situ of unspecified breast: Secondary | ICD-10-CM

## 2011-06-16 DIAGNOSIS — D0510 Intraductal carcinoma in situ of unspecified breast: Secondary | ICD-10-CM

## 2011-06-16 NOTE — Patient Instructions (Signed)
See me again after your mammogram in June.

## 2011-06-16 NOTE — Progress Notes (Signed)
NAME: Julie Norton       DOB: 10-Dec-1929           DATE: 06/16/2011       MRN: [id number]   Julie Norton is a 77 y.o.Marland Kitchenfemale who presents for routine followup of her DCIS left breast diagnosed in 2012 and treated with central partial mastectomy and radiation. She has no problems or concerns on either side.She does not want to do adjuvant hormonal therapy  PFSH: She has had no significant changes since the last visit here.  ROS: There have been no significant changes since the last visit here  EXAM: General: The patient is alert, oriented, generally healty appearing, NAD. Mood and affect are normal.  Breasts:  Right breast is normal. The left shows good healing of the incision with some ridge type effect along the lateral part  Lymphatics: She has no axillary or supraclavicular adenopathy on either side.  Extremities: Full ROM of the surgical side with no lymphedema noted.  Data Reviewed: No new  Impression: Doing well, with no evidence of recurrent cancer or new cancer  Plan: Will continue to follow up on an annual basis here, next visit in June or July after her mammogram.  Encouraged her to consider hormonal therapy

## 2011-06-24 ENCOUNTER — Other Ambulatory Visit: Payer: Self-pay | Admitting: *Deleted

## 2011-06-24 DIAGNOSIS — D0510 Intraductal carcinoma in situ of unspecified breast: Secondary | ICD-10-CM

## 2011-06-24 MED ORDER — TAMOXIFEN CITRATE 20 MG PO TABS: 20.0000 mg | ORAL_TABLET | Freq: Every day | ORAL | Status: DC

## 2011-07-13 ENCOUNTER — Telehealth: Payer: Self-pay | Admitting: *Deleted

## 2011-07-13 NOTE — Telephone Encounter (Signed)
Pt called stating she is scheduled for a massage today, 1 pm and needs medical release. Informed her Dr Mitzi Hansen is in [place] but will call him and request a release be faxed to this office for pt to pick up today. Pt states she does not want to reschedule if at all possible. Informed pt this RN will call her back @ [phone]. Spoke w/Karen RN, [facility] office who states she will inform Dr Mitzi Hansen and call back.  9am Dr Mitzi Hansen called stating he will write release and fax to this office.  Called pt and informed her release will be available for her to pick up in Rad Onc nursing dept.; verbalized understanding.

## 2011-07-27 ENCOUNTER — Encounter: Payer: Self-pay | Admitting: Family Medicine

## 2011-07-27 ENCOUNTER — Ambulatory Visit (INDEPENDENT_AMBULATORY_CARE_PROVIDER_SITE_OTHER): Payer: Medicare Other | Admitting: Family Medicine

## 2011-07-27 VITALS — BP 120/70 | HR 80 | Temp 98.1°F | Resp 12 | Ht 61.75 in | Wt 163.0 lb

## 2011-07-27 DIAGNOSIS — R1013 Epigastric pain: Secondary | ICD-10-CM

## 2011-07-27 DIAGNOSIS — R0989 Other specified symptoms and signs involving the circulatory and respiratory systems: Secondary | ICD-10-CM

## 2011-07-27 DIAGNOSIS — M1990 Unspecified osteoarthritis, unspecified site: Secondary | ICD-10-CM

## 2011-07-27 DIAGNOSIS — R0600 Dyspnea, unspecified: Secondary | ICD-10-CM

## 2011-07-27 DIAGNOSIS — K3189 Other diseases of stomach and duodenum: Secondary | ICD-10-CM

## 2011-07-27 DIAGNOSIS — E039 Hypothyroidism, unspecified: Secondary | ICD-10-CM

## 2011-07-27 DIAGNOSIS — Z23 Encounter for immunization: Secondary | ICD-10-CM

## 2011-07-27 DIAGNOSIS — E7800 Pure hypercholesterolemia, unspecified: Secondary | ICD-10-CM

## 2011-07-27 NOTE — Progress Notes (Signed)
Subjective:    Patient ID: Julie Norton, female    DOB: 1930-03-13, 78 y.o.   MRN: [id number]  HPI  New to establish care. Patient has history of hyperlipidemia, hypothyroidism, postmenopausal, osteoarthritis, and recent diagnosis of ductal carcinoma in situ left breast. Followed closely by oncology. She had lumpectomy followed by radiation and also partial mastectomy last year. She has not yet started tamoxifen but is considering. She had some reluctance to start this medication. Her medications are reviewed and she is otherwise compliant. He has been getting regular labs per cardiologist. No cardiac history.  Some mild dyspnea with exertion since last fall but she's been very inactive since her breast cancer treatment. She denies chest pain. No orthopnea. Some chronic mild lower extremity edema unchanged. No cough. No pleuritic pain. No hemoptysis.  Past Medical History  Diagnosis Date  . H/O: hysterectomy 1978  . Hypercholesteremia   . Hypothyroidism   . Dyspepsia   . Chest pain     intermittent right upper quadrant discomfort radiating around her back      . Tendinitis     of the right arm and shoulder  . Abdominal gas pain     suspected -- may be related to intermittent right upper quadrant discomfort radiating around her back      . Breast mass     left breast  . GERD (gastroesophageal reflux disease)    Past Surgical History  Procedure Date  . Tonsillectomy and adenoidectomy 1950    at 78 years of age  . Breast reduction surgery   . Bladder repair   . Lipoma excision   . Appendectomy 1978  . Breast lumpectomy 02/24/2011    central partial mastectomy - dr Jamey Ripa  . Abdominal hysterectomy     reports that she has never smoked. She has never used smokeless tobacco. She reports that she does not drink alcohol or use illicit drugs. family history includes Aortic aneurysm in her mother and sister; Cancer in her mother; Diabetes in her mother; Heart attack in her mother;  Hypertension in her mother; Pneumonia in her father; and Stroke in her mother. Allergies  Allergen Reactions  . Bactrim Nausea And Vomiting  . Hydrocodone Nausea And Vomiting  . Macrodantin Nausea And Vomiting      Review of Systems  Constitutional: Negative for fever, chills, appetite change, fatigue and unexpected weight change.  Respiratory: Positive for shortness of breath. Negative for cough and wheezing.   Cardiovascular: Positive for leg swelling. Negative for chest pain and palpitations.  Gastrointestinal: Negative for abdominal pain.  Genitourinary: Negative for dysuria.  Neurological: Negative for dizziness and syncope.  Hematological: Negative for adenopathy.  Psychiatric/Behavioral: Negative for confusion and dysphoric mood.       Objective:   Physical Exam  Constitutional: She is oriented to person, place, and time. She appears well-developed and well-nourished.  HENT:  Mouth/Throat: Oropharynx is clear and moist.  Neck: Neck supple. No thyromegaly present.  Cardiovascular: Normal rate and regular rhythm.   Pulmonary/Chest: Effort normal and breath sounds normal. No respiratory distress. She has no wheezes. She has no rales.  Musculoskeletal:       Trace edema lower legs bilaterally which she states is chronic  Neurological: She is alert and oriented to person, place, and time.          Assessment & Plan:  #1 history of left breast cancer- continue close followup with oncology #2 hypothyroidism continue current medication. #3 hyperlipidemia lipids already ordered per cardiology. #  4 osteoarthritis Avoid NSAIDS as much as possible. #5 Mild dyspnea.  Probably secondary to deconditioning. Follow up promptly with cardiology for any chest pain.

## 2011-09-01 ENCOUNTER — Other Ambulatory Visit: Payer: Self-pay | Admitting: *Deleted

## 2011-09-01 MED ORDER — EZETIMIBE 10 MG PO TABS: 10.0000 mg | ORAL_TABLET | Freq: Every day | ORAL | Status: DC

## 2011-09-07 ENCOUNTER — Ambulatory Visit (HOSPITAL_BASED_OUTPATIENT_CLINIC_OR_DEPARTMENT_OTHER): Payer: Medicare Other | Admitting: Family

## 2011-09-07 ENCOUNTER — Telehealth: Payer: Self-pay | Admitting: Oncology

## 2011-09-07 ENCOUNTER — Other Ambulatory Visit (HOSPITAL_BASED_OUTPATIENT_CLINIC_OR_DEPARTMENT_OTHER): Payer: Medicare Other | Admitting: Lab

## 2011-09-07 VITALS — BP 126/71 | HR 78 | Temp 97.9°F | Ht 61.5 in | Wt 164.8 lb

## 2011-09-07 DIAGNOSIS — D0590 Unspecified type of carcinoma in situ of unspecified breast: Secondary | ICD-10-CM

## 2011-09-07 DIAGNOSIS — D0510 Intraductal carcinoma in situ of unspecified breast: Secondary | ICD-10-CM

## 2011-09-07 LAB — CBC WITH DIFFERENTIAL/PLATELET
BASO%: 0.3 % (ref 0.0–2.0)
Eosinophils Absolute: 0.1 10*3/uL (ref 0.0–0.5)
HCT: 38 % (ref 34.8–46.6)
MCHC: 35.2 g/dL (ref 31.5–36.0)
MONO#: 0.5 10*3/uL (ref 0.1–0.9)
NEUT#: 3.3 10*3/uL (ref 1.5–6.5)
RBC: 4.25 10*6/uL (ref 3.70–5.45)
WBC: 5 10*3/uL (ref 3.9–10.3)
lymph#: 1 10*3/uL (ref 0.9–3.3)

## 2011-09-07 NOTE — Telephone Encounter (Signed)
lmonvm of the pt regarding her f/u appt in sept

## 2011-09-08 ENCOUNTER — Encounter: Payer: Self-pay | Admitting: Family

## 2011-09-08 LAB — COMPREHENSIVE METABOLIC PANEL WITH GFR
ALT: 15 U/L (ref 0–35)
AST: 20 U/L (ref 0–37)
Albumin: 3.7 g/dL (ref 3.5–5.2)
Alkaline Phosphatase: 65 U/L (ref 39–117)
BUN: 13 mg/dL (ref 6–23)
CO2: 23 meq/L (ref 19–32)
Calcium: 9.2 mg/dL (ref 8.4–10.5)
Chloride: 107 meq/L (ref 96–112)
Creatinine, Ser: 0.8 mg/dL (ref 0.50–1.10)
Glucose, Bld: 243 mg/dL — ABNORMAL HIGH (ref 70–99)
Potassium: 4.2 meq/L (ref 3.5–5.3)
Sodium: 140 meq/L (ref 135–145)
Total Bilirubin: 0.7 mg/dL (ref 0.3–1.2)
Total Protein: 6.1 g/dL (ref 6.0–8.3)

## 2011-09-08 NOTE — Patient Instructions (Signed)
Return in 6 months

## 2011-09-08 NOTE — Progress Notes (Signed)
OFFICE PROGRESS NOTE  CC Dr. Cyndia Bent  Dr. Varney Baas  Kristian Covey, MD, MD [address] [place] [address]  DIAGNOSIS: Ductal carcinoma in situ, left breast, August, 2012.  PRIOR THERAPY: 1. Central lumpectomy with removal of left nipple areola complex 02/24/2011. Final pathology revealed intermediate to high-grade ductal carcinoma in situ, margins negative. ER+ 99% PR+100%. 2.Radiation therapy 04/14/2011 - 05/08/2011.  CURRENT THERAPY: Surveillance.  INTERVAL HISTORY: Overall, doing well. Admits she filled the prescription for Tamoxifen, but never took it. States she reviewed side effects, spoke with the Pharmacist, and decided not to take it. Her husband is in agreement with the plan. She admits to being nervous about admitting she is not on antiestrogen therapy.   No fevers, chills, night sweats, headaches, blurred vision, shortness of breath, cough, chest pains, or palpitations. No myalgias, arthralgias, or hot flashes. No mastalgia or self-detected problems with the breasts. No hematuria, hematochezia, melena, hemoptysis, or hematemesis. Remainder of the 10 point review of systems is negative  MEDICAL HISTORY: Past Medical History  Diagnosis Date  . H/O: hysterectomy 1978  . Hypercholesteremia   . Hypothyroidism   . Dyspepsia   . Tendinitis     of the right arm and shoulder  . Abdominal gas pain     suspected -- may be related to intermittent right upper quadrant discomfort radiating around her back      . Breast mass     left breast  . GERD (gastroesophageal reflux disease)     ALLERGIES: bactrim; hydrocodone; and macrodantin.  MEDICATIONS:  Current Outpatient Prescriptions  Medication Sig Dispense Refill  . atorvastatin (LIPITOR) 10 MG tablet Take 0.5 tablets (5 mg total) by mouth every other day.  90 tablet  3  . Calcium Carbonate-Vitamin D (CALCIUM 600+D) 600-200 MG-UNIT TABS Take 2 tablets by mouth daily.       . Cholecalciferol  (VITAMIN D PO) Take by mouth.        . ezetimibe (ZETIA) 10 MG tablet Take 1 tablet (10 mg total) by mouth daily.  30 tablet  6  . glucosamine-chondroitin 500-400 MG tablet Take 2 tablets by mouth daily.       Marland Kitchen levothyroxine (SYNTHROID, LEVOTHROID) 100 MCG tablet TAKE 1 TABLET BY MOUTH EVERY DAY 6 OF 7 DAYS  30 tablet  11  . omeprazole (PRILOSEC) 20 MG capsule Take 20 mg by mouth 2 (two) times daily. Taking only as needed        SURGICAL HISTORY:  Past Surgical History  Procedure Date  . Tonsillectomy and adenoidectomy 1950    at 78 years of age  . Breast reduction surgery   . Bladder repair   . Lipoma excision   . Appendectomy 1978  . Breast lumpectomy 02/24/2011    central partial mastectomy - dr Jamey Ripa  . Abdominal hysterectomy     REVIEW OF SYSTEMS:  Pertinent items are noted in HPI.   PHYSICAL EXAM:  General: Well developed, well nourished, in no acute distress.  EENT: No ocular or oral lesions. No stomatitis.  Respiratory: Lungs are clear to auscultation bilaterally with normal respiratory movement and no accessory muscle use. Cardiac: No murmur, rub or tachycardia. No upper or lower extremity edema.  GI: Abdomen is soft, no palpable hepatosplenomegaly. No fluid wave. No tenderness. Musculoskeletal: No kyphosis, no tenderness over the spine, ribs or hips. Lymph: No cervical, infraclavicular, axillary or inguinal adenopathy. Neuro: No focal neurological deficits. Psych: Alert and oriented X 3, appropriate mood and affect.  BREAST EXAM: In the supine position, with the right arm over the head, the right nipple is everted. No periareolar edema or nipple discharge. No mass in any quadrant or subareolar region. No redness of the skin. No right axillary adenopathy. With the left arm over the head, evidence of central lumpectomy with removal of nipple and areola complex. Remote well healed incision with nodularity along the lateral aspect. No mass in any quadrant or subareolar region.  No redness of the skin. No left axillary adenopathy.  Blood pressure 126/71, pulse 78, temperature 97.9 F (36.6 C), temperature source Oral, height 5' 1.5" (1.562 m), weight 164 lb 12.8 oz (74.753 kg).  LABORATORY DATA: Lab Results  Component Value Date   WBC 5.0 09/07/2011   HGB 13.4 09/07/2011   HCT 38.0 09/07/2011   MCV 89.4 09/07/2011   PLT 145 09/07/2011      Chemistry      Component Value Date/Time   NA 140 09/07/2011 0913   K 4.2 09/07/2011 0913   CL 107 09/07/2011 0913   CO2 23 09/07/2011 0913   BUN 13 09/07/2011 0913   CREATININE 0.80 09/07/2011 0913      Component Value Date/Time   CALCIUM 9.2 09/07/2011 0913   ALKPHOS 65 09/07/2011 0913   AST 20 09/07/2011 0913   ALT 15 09/07/2011 0913   BILITOT 0.7 09/07/2011 0913      ASSESSMENT: 78 year old female with : 1. History ductal carcinoma in situ, left breast, no evidence of disease.   2. Declined antiestrogen therapy. Is well informed of the risks/benefits and is able to elucidate them to me today. She admits her age of 89 years entered into the decision-making. Her husband is in agreement.   PLAN:  1. Following NCCN guidelines for DCIS, we will see her back in 6 months. She continues to see surgery in follow-up, also.  2. Bilateral diagnostic mammogram in June, 2013.   All questions were answered. The patient knows to call the clinic with any problems, questions or concerns. We can certainly see the patient much sooner if necessary.  Marland Kitchen

## 2011-10-22 ENCOUNTER — Telehealth: Payer: Self-pay | Admitting: Cardiology

## 2011-10-22 DIAGNOSIS — E039 Hypothyroidism, unspecified: Secondary | ICD-10-CM

## 2011-10-22 NOTE — Telephone Encounter (Signed)
New msg Pt wants to talk about thyroid test she needs to get done when shehas appt in may

## 2011-10-29 ENCOUNTER — Telehealth (INDEPENDENT_AMBULATORY_CARE_PROVIDER_SITE_OTHER): Payer: Self-pay

## 2011-10-29 NOTE — Telephone Encounter (Signed)
LMOM returning pt's call about when she needed an appt with Dr Jamey Ripa either now or after her mgm. I looked at her last o.v. In December Dr Jamey Ripa advised pt to be seen after her mgm in July so I told her to call once she gets her mgm scheduled to call our office.

## 2011-11-04 ENCOUNTER — Other Ambulatory Visit (INDEPENDENT_AMBULATORY_CARE_PROVIDER_SITE_OTHER): Payer: Medicare Other

## 2011-11-04 DIAGNOSIS — E7800 Pure hypercholesterolemia, unspecified: Secondary | ICD-10-CM

## 2011-11-04 DIAGNOSIS — E039 Hypothyroidism, unspecified: Secondary | ICD-10-CM

## 2011-11-04 LAB — LIPID PANEL: VLDL: 19.8 mg/dL (ref 0.0–40.0)

## 2011-11-04 LAB — HEPATIC FUNCTION PANEL
AST: 20 U/L (ref 0–37)
Albumin: 4 g/dL (ref 3.5–5.2)
Alkaline Phosphatase: 71 U/L (ref 39–117)
Total Protein: 6.6 g/dL (ref 6.0–8.3)

## 2011-11-04 LAB — BASIC METABOLIC PANEL
CO2: 27 mEq/L (ref 19–32)
Chloride: 107 mEq/L (ref 96–112)
Glucose, Bld: 199 mg/dL — ABNORMAL HIGH (ref 70–99)
Potassium: 4.2 mEq/L (ref 3.5–5.1)
Sodium: 141 mEq/L (ref 135–145)

## 2011-11-04 NOTE — Progress Notes (Signed)
Quick Note:  Please make copy of labs for patient visit. ______ 

## 2011-11-11 ENCOUNTER — Encounter: Payer: Self-pay | Admitting: Cardiology

## 2011-11-11 ENCOUNTER — Ambulatory Visit (INDEPENDENT_AMBULATORY_CARE_PROVIDER_SITE_OTHER): Payer: Medicare Other | Admitting: Cardiology

## 2011-11-11 DIAGNOSIS — E7800 Pure hypercholesterolemia, unspecified: Secondary | ICD-10-CM

## 2011-11-11 DIAGNOSIS — E039 Hypothyroidism, unspecified: Secondary | ICD-10-CM

## 2011-11-11 DIAGNOSIS — E119 Type 2 diabetes mellitus without complications: Secondary | ICD-10-CM

## 2011-11-11 DIAGNOSIS — R109 Unspecified abdominal pain: Secondary | ICD-10-CM | POA: Insufficient documentation

## 2011-11-11 DIAGNOSIS — E1140 Type 2 diabetes mellitus with diabetic neuropathy, unspecified: Secondary | ICD-10-CM | POA: Insufficient documentation

## 2011-11-11 MED ORDER — METFORMIN HCL 500 MG PO TABS: 500.0000 mg | ORAL_TABLET | Freq: Every day | ORAL | Status: DC

## 2011-11-11 NOTE — Assessment & Plan Note (Signed)
Patient has a history of hypercholesterolemia and is on ezetimibe and atorvastatin.  Lipids are satisfactory on current therapy.  She has not been able to exercise because of fatigue and her weight is up 2 pounds since last visit

## 2011-11-11 NOTE — Assessment & Plan Note (Signed)
The patient had a moderately severe right upper quadrant tenderness and pain which radiated around to the back on the right side.  This lasted about 3 days.  It started after she ate in a hot dog.  We will get a abdominal ultrasound to look for gallstones.

## 2011-11-11 NOTE — Assessment & Plan Note (Signed)
The patient is not previously known to be a diabetic.  Her blood sugars are now running in the 200s.  Her mother and her sister are diabetic.  We will instruct her in a low carbohydrate diabetic diet and we are adding metformin 500 mg one daily to her regimen

## 2011-11-11 NOTE — Patient Instructions (Signed)
Add Metformin 500 mg daily  Your physician recommends that you schedule a follow-up appointment in: 2 months for fasting labs only  (bmet/a1c)  Your physician wants you to follow-up in: 6 months You will receive a reminder letter in the mail two months in advance. If you don't receive a letter, please call our office to schedule the follow-up appointment.   Call [facility] to schedule your ABDOMINAL ULTRASOUND  [phone] (there computers were down)  Diabetes Meal Planning Guide The diabetes meal planning guide is a tool to help you plan your meals and snacks. It is important for people with diabetes to manage their blood glucose (sugar) levels. Choosing the right foods and the right amounts throughout your day will help control your blood glucose. Eating right can even help you improve your blood pressure and reach or maintain a healthy weight. CARBOHYDRATE COUNTING MADE EASY When you eat carbohydrates, they turn to sugar. This raises your blood glucose level. Counting carbohydrates can help you control this level so you feel better. When you plan your meals by counting carbohydrates, you can have more flexibility in what you eat and balance your medicine with your food intake. Carbohydrate counting simply means adding up the total amount of carbohydrate grams in your meals and snacks. Try to eat about the same amount at each meal. Foods with carbohydrates are listed below. Each portion below is 1 carbohydrate serving or 15 grams of carbohydrates. Ask your dietician how many grams of carbohydrates you should eat at each meal or snack. Grains and Starches  1 slice bread.    English muffin or hotdog/hamburger bun.    cup cold cereal (unsweetened).   ? cup cooked pasta or rice.    cup starchy vegetables (corn, potatoes, peas, beans, winter squash).   1 tortilla (6 inches).    bagel.   1 waffle or pancake (size of a CD).    cup cooked cereal.   4 to 6 small crackers.  *Whole  grain is recommended. Fruit  1 cup fresh unsweetened berries, melon, papaya, pineapple.   1 small fresh fruit.    banana or mango.    cup fruit juice (4 oz unsweetened).    cup canned fruit in natural juice or water.   2 tbs dried fruit.   12 to 15 grapes or cherries.  Milk and Yogurt  1 cup fat-free or 1% milk.   1 cup soy milk.   6 oz light yogurt with sugar-free sweetener.   6 oz low-fat soy yogurt.   6 oz plain yogurt.  Vegetables  1 cup raw or  cup cooked is counted as 0 carbohydrates or a "free" food.   If you eat 3 or more servings at 1 meal, count them as 1 carbohydrate serving.  Other Carbohydrates   oz chips or pretzels.    cup ice cream or frozen yogurt.    cup sherbet or sorbet.   2 inch square cake, no frosting.   1 tbs honey, sugar, jam, jelly, or syrup.   2 small cookies.   3 squares of graham crackers.   3 cups popcorn.   6 crackers.   1 cup broth-based soup.   Count 1 cup casserole or other mixed foods as 2 carbohydrate servings.   Foods with less than 20 calories in a serving may be counted as 0 carbohydrates or a "free" food.  You may want to purchase a book or computer software that lists the carbohydrate gram counts of different  foods. In addition, the nutrition facts panel on the labels of the foods you eat are a good source of this information. The label will tell you how big the serving size is and the total number of carbohydrate grams you will be eating per serving. Divide this number by 15 to obtain the number of carbohydrate servings in a portion. Remember, 1 carbohydrate serving equals 15 grams of carbohydrate. SERVING SIZES Measuring foods and serving sizes helps you make sure you are getting the right amount of food. The list below tells how big or small some common serving sizes are.  1 oz.........4 stacked dice.   3 oz........Marland KitchenDeck of cards.   1 tsp.......Marland KitchenTip of little finger.   1 tbs......Marland KitchenMarland KitchenThumb.   2  tbs.......Marland KitchenGolf ball.    cup......Marland KitchenHalf of a fist.   1 cup.......Marland KitchenA fist.  SAMPLE DIABETES MEAL PLAN Below is a sample meal plan that includes foods from the grain and starches, dairy, vegetable, fruit, and meat groups. A dietician can individualize a meal plan to fit your calorie needs and tell you the number of servings needed from each food group. However, controlling the total amount of carbohydrates in your meal or snack is more important than making sure you include all of the food groups at every meal. You may interchange carbohydrate containing foods (dairy, starches, and fruits). The meal plan below is an example of a 2000 calorie diet using carbohydrate counting. This meal plan has 17 carbohydrate servings. Breakfast  1 cup oatmeal (2 carb servings).    cup light yogurt (1 carb serving).   1 cup blueberries (1 carb serving).    cup almonds.  Snack  1 large apple (2 carb servings).   1 low-fat string cheese stick.  Lunch  Chicken breast salad.   1 cup spinach.    cup chopped tomatoes.   2 oz chicken breast, sliced.   2 tbs low-fat [place] dressing.   12 whole-wheat crackers (2 carb servings).   12 to 15 grapes (1 carb serving).   1 cup low-fat milk (1 carb serving).  Snack  1 cup carrots.    cup hummus (1 carb serving).  Dinner  3 oz broiled salmon.   1 cup brown rice (3 carb servings).  Snack  1  cups steamed broccoli (1 carb serving) drizzled with 1 tsp olive oil and lemon juice.   1 cup light pudding (2 carb servings).  DIABETES MEAL PLANNING WORKSHEET Your dietician can use this worksheet to help you decide how many servings of foods and what types of foods are right for you.  BREAKFAST Food Group and Servings / Carb Servings Grain/Starches __________________________________ Dairy __________________________________________ Vegetable ______________________________________ Fruit ___________________________________________ Meat  __________________________________________ Fat ____________________________________________ LUNCH Food Group and Servings / Carb Servings Grain/Starches ___________________________________ Dairy ___________________________________________ Fruit ____________________________________________ Meat ___________________________________________ Fat _____________________________________________ Laural Golden Food Group and Servings / Carb Servings Grain/Starches ___________________________________ Dairy ___________________________________________ Fruit ____________________________________________ Meat ___________________________________________ Fat _____________________________________________ SNACKS Food Group and Servings / Carb Servings Grain/Starches ___________________________________ Dairy ___________________________________________ Vegetable _______________________________________ Fruit ____________________________________________ Meat ___________________________________________ Fat _____________________________________________ DAILY TOTALS Starches _________________________ Vegetable ________________________ Fruit ____________________________ Dairy ____________________________ Meat ____________________________ Fat ______________________________ Document Released: 03/19/2005 Document Revised: 06/11/2011 Document Reviewed: 01/28/2009 ExitCare Patient Information 2012 [place], [place].

## 2011-11-11 NOTE — Progress Notes (Signed)
Julie Norton Date of Birth:  1930/05/18 [facility] [address] [place], [place]  [address] [phone]         Fax   [phone]  History of Present Illness: This pleasant 78 year old woman is seen for a scheduled followup office visit.  She has a past history of hypercholesterolemia.  She also has a history of breast cancer.  He finished radiation therapy in November 2012.  Her surgeon is Dr. Jamey Ripa.  The patient elected not to go on tamoxifen. The patient has been complaining of malaise and fatigue.  She wonders if it is a residual side effects from the radiation therapy.  The patient has also had some right upper quadrant pain radiating through to the back.  She has a mother and a sister who have had cholelithiasis history.  Current Outpatient Prescriptions  Medication Sig Dispense Refill  . atorvastatin (LIPITOR) 10 MG tablet Take 0.5 tablets (5 mg total) by mouth every other day.  90 tablet  3  . Calcium Carbonate-Vitamin D (CALCIUM 600+D) 600-200 MG-UNIT TABS Take 2 tablets by mouth daily.       . Cholecalciferol (VITAMIN D PO) Take by mouth.        . ezetimibe (ZETIA) 10 MG tablet Take 1 tablet (10 mg total) by mouth daily.  30 tablet  6  . glucosamine-chondroitin 500-400 MG tablet Take 2 tablets by mouth daily.       Marland Kitchen levothyroxine (SYNTHROID, LEVOTHROID) 100 MCG tablet TAKE 1 TABLET BY MOUTH EVERY DAY 6 OF 7 DAYS  30 tablet  11  . omeprazole (PRILOSEC) 20 MG capsule Take 20 mg by mouth 2 (two) times daily. Taking only as needed      . metFORMIN (GLUCOPHAGE) 500 MG tablet Take 1 tablet (500 mg total) by mouth daily with breakfast.  30 tablet  5    Allergies  Allergen Reactions  . Bactrim Nausea And Vomiting  . Hydrocodone Nausea And Vomiting  . Macrodantin Nausea And Vomiting    Patient Active Problem List  Diagnoses  . Hypercholesterolemia  . Hypothyroidism  . Postmenopausal state  . Osteoarthritis  . Dyspepsia  . DCIS, left, central,  receptor +  . Abdominal pain  . Diabetes mellitus    History  Smoking status  . Never Smoker   Smokeless tobacco  . Never Used    History  Alcohol Use No    Family History  Problem Relation Age of Onset  . Pneumonia Father   . Heart attack Mother   . Hypertension Mother   . Stroke Mother   . Diabetes Mother   . Aortic aneurysm Mother     abdominal aortic aneurysm  . Cancer Mother     bladder  . Aortic aneurysm Sister     abdominal aortic aneurysm    Review of Systems: Constitutional: no fever chills diaphoresis or fatigue or change in weight.  Head and neck: no hearing loss, no epistaxis, no photophobia or visual disturbance. Respiratory: No cough, shortness of breath or wheezing. Cardiovascular: No chest pain peripheral edema, palpitations. Gastrointestinal: No abdominal distention, no abdominal pain, no change in bowel habits hematochezia or melena. Genitourinary: No dysuria, no frequency, no urgency, no nocturia. Musculoskeletal:No arthralgias, no back pain, no gait disturbance or myalgias. Neurological: No dizziness, no headaches, no numbness, no seizures, no syncope, no weakness, no tremors. Hematologic: No lymphadenopathy, no easy bruising. Psychiatric: No confusion, no hallucinations, no sleep disturbance.    Physical Exam: Filed Vitals:  11/11/11 1147  BP: 128/80  Pulse: 76   the general appearance reveals a well-developed well-nourished woman in no distress.Pupils equal and reactive.   Extraocular Movements are full.  There is no scleral icterus.  The mouth and pharynx are normal.  The neck is supple.  The carotids reveal no bruits.  The jugular venous pressure is normal.  The thyroid is not enlarged.  There is no lymphadenopathy.  The chest is clear to percussion and auscultation. There are no rales or rhonchi. Expansion of the chest is symmetrical.  The precordium is quiet.  The first heart sound is normal.  The second heart sound is physiologically  split.  There is no murmur gallop rub or click.  There is no abnormal lift or heave.  The abdomen is soft and nontender. Bowel sounds are normal. The liver and spleen are not enlarged. There Are no abdominal masses. There are no bruits.  The pedal pulses are good.  There is no phlebitis or edema.  There is no cyanosis or clubbing. Strength is normal and symmetrical in all extremities.  There is no lateralizing weakness.  There are no sensory deficits.     Assessment / Plan: Continue present medication and add metformin 500 mg one daily with breakfast.  We will obtain ultrasound of her gallbladder and pancreas.  She return in 2 months for a basal metabolic panel and a hemoglobin A1c.  Recheck in 6 months for followup office visit and fasting lab work and A1c

## 2011-11-11 NOTE — Assessment & Plan Note (Signed)
The patient wondered if her hypothyroidism was the cause of her malaise and fatigue.  However we checked her thyroid function studies and they are normal on her current replacement therapy with Synthroid.

## 2011-11-16 ENCOUNTER — Ambulatory Visit
Admission: RE | Admit: 2011-11-16 | Discharge: 2011-11-16 | Disposition: A | Discharge status: Home / Self Care | Payer: Medicare Other | Source: Ambulatory Visit | Attending: Cardiology | Admitting: Cardiology

## 2011-11-16 DIAGNOSIS — E119 Type 2 diabetes mellitus without complications: Secondary | ICD-10-CM

## 2011-11-19 ENCOUNTER — Telehealth: Payer: Self-pay | Admitting: Cardiology

## 2011-11-19 NOTE — Telephone Encounter (Signed)
Pt notified of abd [place] results.

## 2011-11-19 NOTE — Telephone Encounter (Signed)
New msg Pt wants to talk about test results

## 2011-11-23 ENCOUNTER — Telehealth: Payer: Self-pay | Admitting: *Deleted

## 2011-11-23 NOTE — Telephone Encounter (Signed)
Message copied by Burnell Blanks on Mon Nov 23, 2011  2:43 PM ------      Message from: Cassell Clement      Created: Mon Nov 16, 2011  8:39 PM       Please report. No gallstones. No cause for the pain found.

## 2011-11-23 NOTE — Telephone Encounter (Signed)
Advised of labs 

## 2011-11-27 ENCOUNTER — Ambulatory Visit (INDEPENDENT_AMBULATORY_CARE_PROVIDER_SITE_OTHER): Payer: Medicare Other | Admitting: Family Medicine

## 2011-11-27 ENCOUNTER — Telehealth: Payer: Self-pay | Admitting: Cardiology

## 2011-11-27 VITALS — BP 155/85 | HR 70 | Temp 97.7°F | Resp 18 | Ht 63.0 in | Wt 158.0 lb

## 2011-11-27 DIAGNOSIS — R109 Unspecified abdominal pain: Secondary | ICD-10-CM

## 2011-11-27 DIAGNOSIS — R1084 Generalized abdominal pain: Secondary | ICD-10-CM

## 2011-11-27 DIAGNOSIS — Z87898 Personal history of other specified conditions: Secondary | ICD-10-CM

## 2011-11-27 LAB — POCT URINALYSIS DIPSTICK
Bilirubin, UA: NEGATIVE
Glucose, UA: NEGATIVE
Ketones, UA: NEGATIVE
Leukocytes, UA: NEGATIVE
Nitrite, UA: NEGATIVE
Protein, UA: NEGATIVE
Spec Grav, UA: 1.025
Urobilinogen, UA: 1
pH, UA: 6.5

## 2011-11-27 LAB — POCT UA - MICROSCOPIC ONLY
Casts, Ur, LPF, POC: NEGATIVE
Crystals, Ur, HPF, POC: NEGATIVE
Mucus, UA: NEGATIVE
Yeast, UA: NEGATIVE

## 2011-11-27 LAB — POCT CBC
Granulocyte percent: 63.7 %G (ref 37–80)
HCT, POC: 43.1 % (ref 37.7–47.9)
Hemoglobin: 14.6 g/dL (ref 12.2–16.2)
Lymph, poc: 1.7 (ref 0.6–3.4)
MCH, POC: 30.3 pg (ref 27–31.2)
MCHC: 33.9 g/dL (ref 31.8–35.4)
MCV: 89.5 fL (ref 80–97)
MID (cbc): 0.5 (ref 0–0.9)
MPV: 8.8 fL (ref 0–99.8)
POC Granulocyte: 3.9 (ref 2–6.9)
POC LYMPH PERCENT: 28 %L (ref 10–50)
POC MID %: 8.3 %M (ref 0–12)
Platelet Count, POC: 171 10*3/uL (ref 142–424)
RBC: 4.82 M/uL (ref 4.04–5.48)
RDW, POC: 12.9 %
WBC: 6.2 10*3/uL (ref 4.6–10.2)

## 2011-11-27 MED ORDER — TRAMADOL HCL 50 MG PO TABS: 50.0000 mg | ORAL_TABLET | Freq: Three times a day (TID) | ORAL | Status: AC | PRN

## 2011-11-27 NOTE — Progress Notes (Signed)
This is a 78 year old woman with breast cancer history previously diagnosed July 2012; surgery and radiation treatments), history of adhesions, and history of diverticulitis who comes in this evening at the direction of her doctor's office (Dr. Patty Sermons). She developed right flank pain which was occurring intermittently for the last week. Today the pain became worse and radiates down into her right groin. The symptoms are associated with loose stools (no blood) and epigastric discomfort which radiates up substernally.  Last week she was evaluated for epigastric problems with an abdominal ultrasound which she reports was normal. She was found to have an elevated blood sugar last week and she was started on metformin about this time.  He has had no vomiting, chills, fever, dysuria, polyuria, rash shortness of breath, history of heart disease. Does have a family history of heart disease.   Objective: Elderly woman in no acute distress. She's holding her right flank. She is seen with her husband present.  HEENT: Unremarkable  Neck: Supple no adenopathy or thyromegaly  Chest: Clear to auscultation, nontender  Heart: Regular, no murmur or gallop  Abdomen: Diffusely tender left and right size, soft, without masses. She has no CVA tenderness or tenderness over her lumbar spine.  Skin: No rash, no ecchymosis.  extremities: Good pedal pulses, no edema.  Results for orders placed in visit on 11/27/11  POCT UA - MICROSCOPIC ONLY      Component Value Range   WBC, Ur, HPF, POC 0-2     RBC, urine, microscopic 4-5     Bacteria, U Microscopic moderate     Mucus, UA negative     Epithelial cells, urine per micros 0-3     Crystals, Ur, HPF, POC negative     Casts, Ur, LPF, POC negative     Yeast, UA negative    POCT URINALYSIS DIPSTICK      Component Value Range   Color, UA yellow     Clarity, UA slightly cloudy     Glucose, UA negative     Bilirubin, UA negative     Ketones, UA negative     Spec Grav, UA 1.025     Blood, UA trace     pH, UA 6.5     Protein, UA negative     Urobilinogen, UA 1.0     Nitrite, UA negative     Leukocytes, UA Negative       Assessment:  Patient seems to have 2 different problems that have been worsening over the last week: Flank pain with tenderness over the posterior superior iliac crest, and diffuse abdominal pain with epigastric discomfort as well. These new symptoms correspond to her starting the metformin and so this may be causing some of the epigastric discomfort. I suspect she does have an irritated nerve because of the radiating into her of the pain from her right back into her right hip and thigh. It's also possible that she has some degree of pancreatitis or dysfunctional bowel activity secondary to adhesions. At any rate, with the white count being below 10,000, I feel we can wait for the other tests to come back and treat symptomatically for now.  Plan: Ultram tonight, amylase pending, at best patient to stay on a liquid diet until he no further details are showing. Tomorrow if the symptoms are continuing we'll have to do further imaging.

## 2011-11-27 NOTE — Telephone Encounter (Signed)
Pt  Is having pain in her lower back and it goes down to her groin and she has had heartburn or angina and she wants to know what to do with the holiday weekend coming up

## 2011-11-27 NOTE — Telephone Encounter (Signed)
Spoke with patient and she states she has been in bed all day and just not feeling well.  After speaking with patient she had multiple issues and  Dr. Patty Sermons not in the office.   Did advise for her to go to [facility] that she needed to be seen.  Stated she would

## 2011-11-28 LAB — COMPREHENSIVE METABOLIC PANEL
ALT: 15 U/L (ref 0–35)
AST: 20 U/L (ref 0–37)
Albumin: 4.4 g/dL (ref 3.5–5.2)
Alkaline Phosphatase: 76 U/L (ref 39–117)
BUN: 14 mg/dL (ref 6–23)
CO2: 28 mEq/L (ref 19–32)
Calcium: 9.7 mg/dL (ref 8.4–10.5)
Chloride: 107 mEq/L (ref 96–112)
Creat: 0.68 mg/dL (ref 0.50–1.10)
Glucose, Bld: 94 mg/dL (ref 70–99)
Potassium: 4.4 mEq/L (ref 3.5–5.3)
Sodium: 143 mEq/L (ref 135–145)
Total Bilirubin: 0.7 mg/dL (ref 0.3–1.2)
Total Protein: 6.9 g/dL (ref 6.0–8.3)

## 2011-11-28 LAB — LIPASE: Lipase: 29 U/L (ref 0–75)

## 2011-11-28 LAB — AMYLASE: Amylase: 38 U/L (ref 0–105)

## 2011-11-29 ENCOUNTER — Telehealth: Payer: Self-pay

## 2011-11-29 NOTE — Telephone Encounter (Signed)
Pt states that she is returning our phone call.

## 2011-11-30 NOTE — Telephone Encounter (Signed)
Pt CB and I gave her normal results of labs. Pt wanted to ask Dr L if she should continue to hold the metformin or if she is supposed to restart it? She would like to stay off of it if possible. Also pt is still having intermittent stomach pain/discomfort (none on Sat, some Sun, none today). Pt asked if Dr L thinks she should have the test he was talking about to test for the stomach pain and, if so, would he order it?

## 2011-11-30 NOTE — Telephone Encounter (Signed)
Stop metformin.  Recheck sugar qam and return if BS over 150.  Recommend she get a H.Pylori test.  She can come here or go to her PCP

## 2011-12-01 NOTE — Telephone Encounter (Signed)
Gave pt info from Dr L. Pt agreed to check her BS Qam and wrote name of test down.

## 2011-12-02 ENCOUNTER — Encounter: Payer: Self-pay | Admitting: *Deleted

## 2011-12-02 ENCOUNTER — Telehealth: Payer: Self-pay | Admitting: Cardiology

## 2011-12-02 NOTE — Telephone Encounter (Signed)
Left message to call back  

## 2011-12-02 NOTE — Telephone Encounter (Signed)
Pt went to urgent care, pt wants to talk to [facility] re that visit, dr Milus Glazier wants her to check blood sugar each morning, but needs the stuff to do that,  pls call [phone]

## 2011-12-03 NOTE — Telephone Encounter (Signed)
Patient returning nurse call at [phone].

## 2011-12-04 ENCOUNTER — Other Ambulatory Visit: Payer: Self-pay | Admitting: Obstetrics & Gynecology

## 2011-12-04 DIAGNOSIS — Z9889 Other specified postprocedural states: Secondary | ICD-10-CM

## 2011-12-04 DIAGNOSIS — Z853 Personal history of malignant neoplasm of breast: Secondary | ICD-10-CM

## 2011-12-04 DIAGNOSIS — Z9010 Acquired absence of unspecified breast and nipple: Secondary | ICD-10-CM

## 2011-12-04 NOTE — Telephone Encounter (Signed)
Left message done.

## 2011-12-04 NOTE — Telephone Encounter (Signed)
Fu msg Pt called back about rx for needles and test strips, meter. [facility] uses [facility] [address]

## 2011-12-08 ENCOUNTER — Other Ambulatory Visit: Payer: Self-pay | Admitting: Family Medicine

## 2011-12-08 DIAGNOSIS — R109 Unspecified abdominal pain: Secondary | ICD-10-CM

## 2011-12-08 NOTE — Telephone Encounter (Signed)
Pt CB and stated that her PCP would rather have pt just come back here for H Pylori test. Dr L, do you want to order that lab as future order so that pt can come in for test w/out having to be seen again?

## 2011-12-09 ENCOUNTER — Ambulatory Visit (INDEPENDENT_AMBULATORY_CARE_PROVIDER_SITE_OTHER): Payer: Medicare Other

## 2011-12-09 DIAGNOSIS — R109 Unspecified abdominal pain: Secondary | ICD-10-CM

## 2011-12-09 NOTE — Telephone Encounter (Signed)
LMOM for pt that order has been put in for lab and she can come by any time for Lab Only

## 2011-12-10 LAB — H. PYLORI ANTIBODY, IGG: H Pylori IgG: 1.67 {ISR} — ABNORMAL HIGH

## 2011-12-10 NOTE — Progress Notes (Signed)
  Subjective:    Patient ID: Julie Norton, female    DOB: 1929-11-16, 78 y.o.   MRN: [id number]  HPI  Do not remember  Review of Systems     Objective:   Physical Exam   Do not remember     Assessment & Plan:  Do not remember

## 2011-12-11 ENCOUNTER — Other Ambulatory Visit: Payer: Self-pay | Admitting: Family Medicine

## 2011-12-13 ENCOUNTER — Other Ambulatory Visit: Payer: Self-pay | Admitting: Family Medicine

## 2011-12-13 MED ORDER — CLARITHROMYCIN ER 500 MG PO TB24: 1000.0000 mg | ORAL_TABLET | Freq: Two times a day (BID) | ORAL | Status: AC

## 2011-12-13 MED ORDER — OMEPRAZOLE 20 MG PO CPDR: 20.0000 mg | DELAYED_RELEASE_CAPSULE | Freq: Two times a day (BID) | ORAL | Status: DC

## 2011-12-13 MED ORDER — AMOXICILLIN 500 MG PO CAPS: 1000.0000 mg | ORAL_CAPSULE | Freq: Two times a day (BID) | ORAL | Status: AC

## 2011-12-14 ENCOUNTER — Encounter (INDEPENDENT_AMBULATORY_CARE_PROVIDER_SITE_OTHER): Payer: Medicare Other | Admitting: Surgery

## 2011-12-16 ENCOUNTER — Telehealth: Payer: Self-pay

## 2011-12-16 NOTE — Telephone Encounter (Signed)
[facility] (fax [phone]) faxed Prior Auth notice that ins will not cover a Prevpac. Can you Rx the medications separately - I believe that is what the insurance companies usually cover instead.

## 2011-12-17 ENCOUNTER — Telehealth: Payer: Self-pay | Admitting: Cardiology

## 2011-12-17 NOTE — Telephone Encounter (Signed)
Please return call to patient at [phone] regarding medication possibly making her sick.

## 2011-12-17 NOTE — Telephone Encounter (Signed)
Pt went to [facility] last week for the treatment of stomach pain.  She was started on amoxicillin 1,000 mg two tablets twice a day, clarithromycin 1,000 mg two tablets twice a day and omeprazole 20 mg twice a day for h. pylori.  This is causing her to have a decrease in energy, a metallic taste in her mouth, nausea and stomach pain.  She states it is so bad that she had to go home from work.  She would like Dr Yevonne Pax opinion on if she really needs this medication and if she does need an antibiotic is there something else she can take.  Pt aware Dr Patty Sermons and Juliette Alcide both are not in the office today.  Informed pt that I will forward information to them for review.  Pt is in agreement.

## 2011-12-17 NOTE — Telephone Encounter (Signed)
The clarithromycin is probably the culprit for her GI symptoms.  Would stop it and finish out the amoxicillin and the omeprazole.  If sx recur she would need to see a GI specialist to see what other options are (the urgent care is apparently treating her for H. Pylori

## 2011-12-18 NOTE — Telephone Encounter (Signed)
Advised patient

## 2011-12-30 NOTE — Telephone Encounter (Signed)
Pt is calling in and not sure if she was to call back about some medication she was taking, please contact her she would like for someone to call her cell number [phone]

## 2011-12-30 NOTE — Telephone Encounter (Signed)
D/W pt her OV w/ Dr Patty Sermons and his instr's for her to DC one of the Abxs d/t SEs (see OV notes in EPIC). instr'd pt to follow-up w/her PCP if any Sxs continue, or that we will be happy to see her again also. Pt agreed.

## 2012-01-11 ENCOUNTER — Ambulatory Visit
Admission: RE | Admit: 2012-01-11 | Discharge: 2012-01-11 | Disposition: A | Discharge status: Home / Self Care | Payer: Medicare Other | Source: Ambulatory Visit | Attending: Obstetrics & Gynecology | Admitting: Obstetrics & Gynecology

## 2012-01-11 DIAGNOSIS — Z853 Personal history of malignant neoplasm of breast: Secondary | ICD-10-CM

## 2012-01-11 DIAGNOSIS — Z9010 Acquired absence of unspecified breast and nipple: Secondary | ICD-10-CM

## 2012-01-11 DIAGNOSIS — Z9889 Other specified postprocedural states: Secondary | ICD-10-CM

## 2012-01-13 ENCOUNTER — Other Ambulatory Visit: Payer: Self-pay | Admitting: Cardiology

## 2012-01-13 ENCOUNTER — Other Ambulatory Visit (INDEPENDENT_AMBULATORY_CARE_PROVIDER_SITE_OTHER): Payer: Medicare Other

## 2012-01-13 DIAGNOSIS — R109 Unspecified abdominal pain: Secondary | ICD-10-CM

## 2012-01-13 DIAGNOSIS — E119 Type 2 diabetes mellitus without complications: Secondary | ICD-10-CM

## 2012-01-13 DIAGNOSIS — E7800 Pure hypercholesterolemia, unspecified: Secondary | ICD-10-CM

## 2012-01-13 LAB — BASIC METABOLIC PANEL
BUN: 14 mg/dL (ref 6–23)
CO2: 24 mEq/L (ref 19–32)
Chloride: 109 mEq/L (ref 96–112)
Glucose, Bld: 124 mg/dL — ABNORMAL HIGH (ref 70–99)
Potassium: 3.8 mEq/L (ref 3.5–5.1)
Sodium: 140 mEq/L (ref 135–145)

## 2012-01-13 NOTE — Telephone Encounter (Signed)
Refilled omeprazole and test strips

## 2012-01-14 ENCOUNTER — Encounter (INDEPENDENT_AMBULATORY_CARE_PROVIDER_SITE_OTHER): Payer: Self-pay | Admitting: Surgery

## 2012-01-14 ENCOUNTER — Ambulatory Visit (INDEPENDENT_AMBULATORY_CARE_PROVIDER_SITE_OTHER): Payer: Medicare Other | Admitting: Surgery

## 2012-01-14 VITALS — BP 126/68 | HR 76 | Temp 98.8°F | Resp 16 | Ht 62.0 in | Wt 157.2 lb

## 2012-01-14 DIAGNOSIS — Z853 Personal history of malignant neoplasm of breast: Secondary | ICD-10-CM

## 2012-01-14 NOTE — Patient Instructions (Signed)
Continue to have annual mammograms We will see you again on an as needed basis. Please call the office at [phone] if you have any questions or concerns. Thank you for allowing us to take care of you.  

## 2012-01-14 NOTE — Progress Notes (Signed)
NAME: Julie Norton       DOB: 1930-01-12           DATE: 01/14/2012       MRN: [id number]   Julie Norton is a 78 y.o.Marland Kitchenfemale who presents for routine followup of her DCIS left breast diagnosed in 2012 and treated with central partial mastectomy and radiation. She has no problems or concerns on either side.She decided not to do adjuvant hormonal therapy.  PFSH: She has had no significant changes since the last visit here.  ROS: There have been no significant changes since the last visit here Exception of the development of some fatigue issues which she relates to her radiation therapy.  EXAM: General: The patient is alert, oriented, generally healty appearing, NAD. Mood and affect are normal.  Breasts:  Right breast is normal, Status post reduction surgery.. The left shows good healing of the incision with some ridge type effect along the lateral part  Lymphatics: She has no axillary or supraclavicular adenopathy on either side.  Extremities: Full ROM of the surgical side with no lymphedema noted.  Data Reviewed: Mammogram recently was negative  Impression: Doing well, with no evidence of recurrent cancer or new cancer  Plan: She is well followed by her oncologist in OB/GYN and we will see her again here when necessary.

## 2012-01-15 ENCOUNTER — Other Ambulatory Visit: Payer: Self-pay | Admitting: Cardiology

## 2012-01-15 ENCOUNTER — Telehealth: Payer: Self-pay | Admitting: *Deleted

## 2012-01-15 NOTE — Telephone Encounter (Signed)
Advised patient of lab results  

## 2012-01-15 NOTE — Telephone Encounter (Signed)
Message copied by Burnell Blanks on Fri Jan 15, 2012  3:08 PM ------      Message from: Cassell Clement      Created: Thu Jan 14, 2012  1:11 PM       The hemoglobin A1c is elevated at 6.9 and the blood sugar is 124.  Continue same medication and be careful with carbohydrates and to try to get regular exercise.

## 2012-02-25 ENCOUNTER — Other Ambulatory Visit: Payer: Self-pay | Admitting: Cardiology

## 2012-03-10 ENCOUNTER — Telehealth: Payer: Self-pay | Admitting: *Deleted

## 2012-03-10 ENCOUNTER — Ambulatory Visit (HOSPITAL_BASED_OUTPATIENT_CLINIC_OR_DEPARTMENT_OTHER): Payer: Medicare Other | Admitting: Oncology

## 2012-03-10 ENCOUNTER — Encounter: Payer: Self-pay | Admitting: Oncology

## 2012-03-10 VITALS — BP 133/75 | HR 69 | Temp 98.4°F | Resp 20 | Ht 62.0 in | Wt 157.2 lb

## 2012-03-10 DIAGNOSIS — D0590 Unspecified type of carcinoma in situ of unspecified breast: Secondary | ICD-10-CM

## 2012-03-10 DIAGNOSIS — Z170 Estrogen receptor positive status [ER+]: Secondary | ICD-10-CM

## 2012-03-10 DIAGNOSIS — D0510 Intraductal carcinoma in situ of unspecified breast: Secondary | ICD-10-CM

## 2012-03-10 NOTE — Progress Notes (Signed)
OFFICE PROGRESS NOTE  CC  Julie Covey, MD [address] [place] [address] Dr. Varney Baas Dr. Cassell Clement  DIAGNOSIS: 78 year old female with ductal carcinoma in situ of the left breast status post central lumpectomy on 02/24/2011.  PRIOR THERAPY:  #1 patient underwent a central lumpectomy with removal of the left nipple area LOC complex on 02/24/2011. The final pathology revealed intermediate to high-grade ductal carcinoma in situ. Tumor was ER +99% PR +100%.  #2 patient then received radiation therapy from 04/14/2011 2 05/08/2011  #3 patient declined antiestrogen therapy..  CURRENT THERAPY:Observation  INTERVAL HISTORY: Julie Norton 78 y.o. female returns for Followup visit without any significant complaints except for a little bit of nausea and some tingling in her toes. She has no myalgias or arthralgias she has not noticed any problems with her breasts. Remainder of the review of systems is negative.  MEDICAL HISTORY: Past Medical History  Diagnosis Date  . H/O: hysterectomy 1978  . Hypercholesteremia   . Hypothyroidism   . Dyspepsia   . Chest pain     intermittent right upper quadrant discomfort radiating around her back      . Tendinitis     of the right arm and shoulder  . Abdominal gas pain     suspected -- may be related to intermittent right upper quadrant discomfort radiating around her back      . Breast mass     left breast  . GERD (gastroesophageal reflux disease)   . Cancer     left breast    ALLERGIES:  is allergic to bactrim; dilaudid; hydrocodone; and macrodantin.  MEDICATIONS:  Current Outpatient Prescriptions  Medication Sig Dispense Refill  . aspirin 325 MG EC tablet Take 325 mg by mouth daily.      Marland Kitchen atorvastatin (LIPITOR) 10 MG tablet Take 0.5 tablets (5 mg total) by mouth every other day.  90 tablet  3  . Blood Glucose Monitoring Suppl (ONE TOUCH ULTRA 2) W/DEVICE KIT       . Calcium Carbonate-Vitamin D  (CALCIUM 600+D) 600-200 MG-UNIT TABS Take 2 tablets by mouth daily.       . Cholecalciferol (VITAMIN D PO) Take by mouth.        . ezetimibe (ZETIA) 10 MG tablet Take 1 tablet (10 mg total) by mouth daily.  30 tablet  6  . glucosamine-chondroitin 500-400 MG tablet Take 2 tablets by mouth daily.       Marland Kitchen levothyroxine (SYNTHROID, LEVOTHROID) 100 MCG tablet TAKE 1 TABLET BY MOUTH EVERY DAY 6 OF 7 DAYS  30 tablet  10  . metFORMIN (GLUCOPHAGE) 500 MG tablet Take 1 tablet (500 mg total) by mouth daily with breakfast.  30 tablet  5  . omeprazole (PRILOSEC) 20 MG capsule Take 20 mg by mouth 2 (two) times daily. Taking only as needed      . omeprazole (PRILOSEC) 20 MG capsule Take 1 capsule (20 mg total) by mouth 2 (two) times daily.  28 capsule  0  . omeprazole (PRILOSEC) 20 MG capsule TAKE 1 CAPSULE TWICE DAILY  60 capsule  11  . ONE TOUCH ULTRA TEST test strip CHECK ONCE A DAY  50 each  11  . ONETOUCH DELICA LANCETS MISC         SURGICAL HISTORY:  Past Surgical History  Procedure Date  . Tonsillectomy and adenoidectomy 1950    at 78 years of age  . Breast reduction surgery   . Bladder repair   .  Lipoma excision   . Appendectomy 1978  . Breast lumpectomy 02/24/2011    central partial mastectomy - dr Jamey Ripa  . Abdominal hysterectomy     REVIEW OF SYSTEMS:  Pertinent items are noted in HPI.   PHYSICAL EXAMINATION: General appearance: alert, cooperative and appears stated age Resp: clear to auscultation bilaterally Cardio: regular rate and rhythm GI: soft, non-tender; bowel sounds normal; no masses,  no organomegaly Extremities: extremities normal, atraumatic, no cyanosis or edema Neurologic: Grossly normal Bilateral breast examination left breast reveals well-healed surgical lumpectomy scar without any nodularity or evidence of local recurrence. Right breast no masses or nipple discharge.  ECOG PERFORMANCE STATUS: 1 - Symptomatic but completely ambulatory  Blood pressure 133/75, pulse  69, temperature 98.4 F (36.9 C), temperature source Oral, resp. rate 20, height 5\' 2"  (1.575 m), weight 157 lb 3.2 oz (71.305 kg).  LABORATORY DATA: Lab Results  Component Value Date   WBC 6.2 11/27/2011   HGB 14.6 11/27/2011   HCT 43.1 11/27/2011   MCV 89.5 11/27/2011   PLT 145 09/07/2011      Chemistry      Component Value Date/Time   NA 140 01/13/2012 0829   K 3.8 01/13/2012 0829   CL 109 01/13/2012 0829   CO2 24 01/13/2012 0829   BUN 14 01/13/2012 0829   CREATININE 0.8 01/13/2012 0829   CREATININE 0.68 11/27/2011 1856      Component Value Date/Time   CALCIUM 9.3 01/13/2012 0829   ALKPHOS 76 11/27/2011 1856   AST 20 11/27/2011 1856   ALT 15 11/27/2011 1856   BILITOT 0.7 11/27/2011 1856       RADIOGRAPHIC STUDIES:  No results found.  ASSESSMENT: 78 year old female with  #1 DCIS of the left breast status post lumpectomy followed by radiation therapy all completed on 05/08/2011. Thereafter she declined any chemoprevention with tamoxifen. Overall she is doing well and has no evidence of recurrent disease.   PLAN: Patient will continue to see me on a yearly basis.   All questions were answered. The patient knows to call the clinic with any problems, questions or concerns. We can certainly see the patient much sooner if necessary.  I spent 15 minutes counseling the patient face to face. The total time spent in the appointment was 30 minutes.    Drue Second, MD Medical/Oncology [facility] [phone] (beeper) [phone] (Office)  03/10/2012, 11:56 AM

## 2012-03-10 NOTE — Patient Instructions (Addendum)
I will see you back in September 2014

## 2012-03-10 NOTE — Telephone Encounter (Signed)
Gave patient appointment 03-10-2013 starting at 11:00am

## 2012-04-02 ENCOUNTER — Other Ambulatory Visit: Payer: Self-pay | Admitting: Cardiology

## 2012-04-04 ENCOUNTER — Other Ambulatory Visit: Payer: Self-pay | Admitting: Cardiovascular Disease

## 2012-04-04 MED ORDER — GLUCOSE BLOOD VI STRP: ORAL_STRIP | Status: DC

## 2012-04-11 ENCOUNTER — Other Ambulatory Visit: Payer: Self-pay | Admitting: Cardiology

## 2012-05-19 ENCOUNTER — Other Ambulatory Visit (INDEPENDENT_AMBULATORY_CARE_PROVIDER_SITE_OTHER): Payer: Medicare Other

## 2012-05-19 DIAGNOSIS — E119 Type 2 diabetes mellitus without complications: Secondary | ICD-10-CM

## 2012-05-19 DIAGNOSIS — R109 Unspecified abdominal pain: Secondary | ICD-10-CM

## 2012-05-19 DIAGNOSIS — E7800 Pure hypercholesterolemia, unspecified: Secondary | ICD-10-CM

## 2012-05-19 LAB — HEPATIC FUNCTION PANEL
Alkaline Phosphatase: 65 U/L (ref 39–117)
Bilirubin, Direct: 0.2 mg/dL (ref 0.0–0.3)
Total Bilirubin: 0.8 mg/dL (ref 0.3–1.2)

## 2012-05-19 LAB — BASIC METABOLIC PANEL
BUN: 17 mg/dL (ref 6–23)
CO2: 27 mEq/L (ref 19–32)
Chloride: 106 mEq/L (ref 96–112)
Creatinine, Ser: 0.7 mg/dL (ref 0.4–1.2)
Glucose, Bld: 146 mg/dL — ABNORMAL HIGH (ref 70–99)

## 2012-05-19 LAB — LIPID PANEL
Cholesterol: 153 mg/dL (ref 0–200)
LDL Cholesterol: 83 mg/dL (ref 0–99)
Total CHOL/HDL Ratio: 3

## 2012-05-19 NOTE — Progress Notes (Signed)
Quick Note:  Please make copy of labs for patient visit. ______ 

## 2012-05-26 ENCOUNTER — Encounter: Payer: Self-pay | Admitting: Cardiology

## 2012-05-26 ENCOUNTER — Ambulatory Visit (INDEPENDENT_AMBULATORY_CARE_PROVIDER_SITE_OTHER): Payer: Medicare Other | Admitting: Cardiology

## 2012-05-26 VITALS — BP 130/70 | HR 68 | Resp 18 | Ht 62.0 in | Wt 156.0 lb

## 2012-05-26 DIAGNOSIS — E7800 Pure hypercholesterolemia, unspecified: Secondary | ICD-10-CM

## 2012-05-26 DIAGNOSIS — M1990 Unspecified osteoarthritis, unspecified site: Secondary | ICD-10-CM

## 2012-05-26 DIAGNOSIS — E119 Type 2 diabetes mellitus without complications: Secondary | ICD-10-CM

## 2012-05-26 NOTE — Assessment & Plan Note (Signed)
Patient continues to have widespread osteoarthritis.  She has a prominent cyst overlying the distal portion of her right index finger and she will call Dr. Teressa Senter about this.

## 2012-05-26 NOTE — Assessment & Plan Note (Signed)
Patient has a history of hypercholesterolemia.  Her blood work is not as good this time.  She has been taking her medication only intermittently.  Her weight however is down 6 pounds since last visit.  She has not had any side effects from the Lipitor or ezetimibe

## 2012-05-26 NOTE — Progress Notes (Signed)
Julie Norton Date of Birth:  1930-07-05 [place] [address] [place], [place]  [address] [phone]         Fax   [phone]  History of Present Illness: This pleasant 78 year old woman is seen for a scheduled followup office visit. She has a past history of hypercholesterolemia. She also has a history of breast cancer.  She finished radiation therapy in November 2012. Her surgeon is Dr. Jamey Ripa. The patient elected not to go on tamoxifen.  When we last saw her she was complaining of some right upper quadrant discomfort.  However we went ahead and did an gallbladder ultrasound on 11/16/11 which was negative for cholelithiasis.  Symptoms have improved.    Current Outpatient Prescriptions  Medication Sig Dispense Refill  . aspirin 325 MG EC tablet Take 325 mg by mouth daily.      Marland Kitchen atorvastatin (LIPITOR) 10 MG tablet Take 0.5 tablets (5 mg total) by mouth every other day.  90 tablet  3  . Blood Glucose Monitoring Suppl (ONE TOUCH ULTRA 2) W/DEVICE KIT       . Calcium Carbonate-Vitamin D (CALCIUM 600+D) 600-200 MG-UNIT TABS Take 2 tablets by mouth daily.       . Cholecalciferol (VITAMIN D PO) Take by mouth.        . ezetimibe (ZETIA) 10 MG tablet Take 1 tablet (10 mg total) by mouth daily.  30 tablet  6  . glucosamine-chondroitin 500-400 MG tablet Take 2 tablets by mouth daily.       Marland Kitchen glucose blood (ONE TOUCH ULTRA TEST) test strip Use as instructed  100 each  11  . levothyroxine (SYNTHROID, LEVOTHROID) 100 MCG tablet TAKE 1 TABLET BY MOUTH EVERY DAY 6 OF 7 DAYS  30 tablet  10  . metFORMIN (GLUCOPHAGE) 500 MG tablet Take 1 tablet (500 mg total) by mouth daily with breakfast.  30 tablet  5  . omeprazole (PRILOSEC) 20 MG capsule Take 20 mg by mouth 2 (two) times daily. Taking only as needed      . omeprazole (PRILOSEC) 20 MG capsule Take 1 capsule (20 mg total) by mouth 2 (two) times daily.  28 capsule  0  . omeprazole (PRILOSEC) 20 MG capsule TAKE 1 CAPSULE  TWICE DAILY  60 capsule  11  . ONETOUCH DELICA LANCETS MISC CHECK ONCE A DAY  100 each  0    Allergies  Allergen Reactions  . Bactrim Nausea And Vomiting  . Dilaudid (Hydromorphone Hcl)   . Hydrocodone Nausea And Vomiting  . Macrodantin Nausea And Vomiting    Patient Active Problem List  Diagnosis  . Hypercholesterolemia  . Hypothyroidism  . Postmenopausal state  . Osteoarthritis  . Dyspepsia  . DCIS, left, central, receptor +  . Abdominal pain  . Diabetes mellitus    History  Smoking status  . Never Smoker   Smokeless tobacco  . Never Used    History  Alcohol Use No    Family History  Problem Relation Age of Onset  . Pneumonia Father   . Heart attack Mother   . Hypertension Mother   . Stroke Mother   . Diabetes Mother   . Aortic aneurysm Mother     abdominal aortic aneurysm  . Cancer Mother     bladder  . Aortic aneurysm Sister     abdominal aortic aneurysm    Review of Systems: Constitutional: no fever chills diaphoresis or fatigue or change in weight.  Head and neck: no  hearing loss, no epistaxis, no photophobia or visual disturbance. Respiratory: No cough, shortness of breath or wheezing. Cardiovascular: No chest pain peripheral edema, palpitations. Gastrointestinal: No abdominal distention, no abdominal pain, no change in bowel habits hematochezia or melena. Genitourinary: No dysuria, no frequency, no urgency, no nocturia. Musculoskeletal:No arthralgias, no back pain, no gait disturbance or myalgias. Neurological: No dizziness, no headaches, no numbness, no seizures, no syncope, no weakness, no tremors. Hematologic: No lymphadenopathy, no easy bruising. Psychiatric: No confusion, no hallucinations, no sleep disturbance.    Physical Exam: Filed Vitals:   05/26/12 1533  BP: 130/70  Pulse: 68  Resp: 18   the general appearance reveals a well-developed well-nourished woman in no distress.The head and neck exam reveals pupils equal and reactive.   Extraocular movements are full.  There is no scleral icterus.  The mouth and pharynx are normal.  The neck is supple.  The carotids reveal no bruits.  The jugular venous pressure is normal.  The  thyroid is not enlarged.  There is no lymphadenopathy.  The chest is clear to percussion and auscultation.  There are no rales or rhonchi.  Expansion of the chest is symmetrical.  The precordium is quiet.  The first heart sound is normal.  The second heart sound is physiologically split.  There is no murmur gallop rub or click.  There is no abnormal lift or heave.  The abdomen is soft and nontender.  The bowel sounds are normal.  The liver and spleen are not enlarged.  There are no abdominal masses.  There are no abdominal bruits.  Extremities reveal good pedal pulses.  There is no phlebitis or edema.  There is no cyanosis or clubbing.  Strength is normal and symmetrical in all extremities.  There is no lateralizing weakness.  There are no sensory deficits.  The skin is warm and dry.  There is no rash.     Assessment / Plan: Continue same medication.  Recheck in 6 months for followup office visit EKG CBC TSH lipid panel hepatic function panel and basal metabolic panel.  Work harder on diet and weight loss in regard to her cholesterol and her blood sugar.

## 2012-05-26 NOTE — Patient Instructions (Addendum)
Your physician recommends that you continue on your current medications as directed. Please refer to the Current Medication list given to you today.  Your physician wants you to follow-up in: 6 months. You will receive a reminder letter in the mail two months in advance. If you don't receive a letter, please call our office to schedule the follow-up appointment.  

## 2012-05-26 NOTE — Assessment & Plan Note (Addendum)
Patient has adult onset diabetes mellitus.  She's not having any hypoglycemic episodes.  She is on low-dose metformin.  Her hemoglobin A1c is down to 6.7

## 2012-06-27 ENCOUNTER — Other Ambulatory Visit: Payer: Self-pay

## 2012-06-27 DIAGNOSIS — E119 Type 2 diabetes mellitus without complications: Secondary | ICD-10-CM

## 2012-06-27 MED ORDER — METFORMIN HCL 500 MG PO TABS: 500.0000 mg | ORAL_TABLET | Freq: Every day | ORAL | Status: DC

## 2012-07-01 ENCOUNTER — Ambulatory Visit (INDEPENDENT_AMBULATORY_CARE_PROVIDER_SITE_OTHER): Payer: Medicare Other | Admitting: Family Medicine

## 2012-07-01 VITALS — BP 120/80 | HR 72 | Temp 98.3°F | Resp 16 | Ht 62.0 in | Wt 161.0 lb

## 2012-07-01 DIAGNOSIS — B029 Zoster without complications: Secondary | ICD-10-CM

## 2012-07-01 MED ORDER — VALACYCLOVIR HCL 1 G PO TABS: 1000.0000 mg | ORAL_TABLET | Freq: Three times a day (TID) | ORAL | Status: DC

## 2012-07-01 NOTE — Progress Notes (Signed)
  Subjective:    Patient ID: Julie Norton, female    DOB: 03-23-1930, 78 y.o.   MRN: [id number]  HPI Patient seen with minimally painful rash about 3 days duration. Location is lower cervical region and radiating toward the neck. Vesicular. 3/10 intensity. Burning quality. She had shingles vaccine back in 2007. No fever or any other difficulties.   Review of Systems  Constitutional: Negative for fever and chills.  Neurological: Negative for headaches.       Objective:   Physical Exam  Constitutional: She appears well-developed and well-nourished.  Cardiovascular: Normal rate and regular rhythm.   Pulmonary/Chest: Effort normal and breath sounds normal. No respiratory distress. She has no wheezes. She has no rales.  Skin: Rash noted.       Patient has scattered areas of rash and patch distribution with erythematous base and vesicular surface with first patch being midline lower cervical region and she is a couple patches lower neck radiating around right side          Assessment & Plan:  Shingles. Valtrex 1 g 3 times a day for 7 days. At this point minimal pain so try over-the-counter analgesics as needed

## 2012-07-01 NOTE — Patient Instructions (Signed)

## 2012-08-20 ENCOUNTER — Other Ambulatory Visit: Payer: Self-pay

## 2012-09-02 ENCOUNTER — Telehealth: Payer: Self-pay | Admitting: *Deleted

## 2012-09-02 MED ORDER — EZETIMIBE 10 MG PO TABS: 10.0000 mg | ORAL_TABLET | Freq: Every day | ORAL | Status: DC

## 2012-09-02 NOTE — Telephone Encounter (Signed)
Refilled med

## 2012-09-07 ENCOUNTER — Other Ambulatory Visit: Payer: Self-pay | Admitting: *Deleted

## 2012-11-04 ENCOUNTER — Telehealth: Payer: Self-pay | Admitting: Cardiology

## 2012-11-04 NOTE — Telephone Encounter (Signed)
Bulging disk, may need surgery. Wanted to let us know, made  Dr. Patty Sermons aware

## 2012-11-04 NOTE — Telephone Encounter (Signed)
New Problem:    Patient called in wanting you to call her back.  Please call back.

## 2012-11-29 ENCOUNTER — Other Ambulatory Visit (INDEPENDENT_AMBULATORY_CARE_PROVIDER_SITE_OTHER): Payer: Medicare Other

## 2012-11-29 DIAGNOSIS — E7800 Pure hypercholesterolemia, unspecified: Secondary | ICD-10-CM

## 2012-11-29 LAB — HEPATIC FUNCTION PANEL
ALT: 19 U/L (ref 0–35)
AST: 19 U/L (ref 0–37)
Bilirubin, Direct: 0.1 mg/dL (ref 0.0–0.3)
Total Bilirubin: 1 mg/dL (ref 0.3–1.2)

## 2012-11-29 LAB — BASIC METABOLIC PANEL
BUN: 11 mg/dL (ref 6–23)
Calcium: 9.1 mg/dL (ref 8.4–10.5)
GFR: 100.14 mL/min (ref >60.00)
Glucose, Bld: 227 mg/dL — ABNORMAL HIGH (ref 70–99)

## 2012-11-29 LAB — LIPID PANEL
Cholesterol: 185 mg/dL (ref 0–200)
HDL: 53 mg/dL (ref >39.00)

## 2012-11-29 LAB — CBC WITH DIFFERENTIAL/PLATELET
Basophils Absolute: 0 10*3/uL (ref 0.0–0.1)
Hemoglobin: 14.1 g/dL (ref 12.0–15.0)
Lymphocytes Relative: 25.3 % (ref 12.0–46.0)
Monocytes Relative: 9.5 % (ref 3.0–12.0)
Platelets: 135 10*3/uL — ABNORMAL LOW (ref 150.0–400.0)
RDW: 14.3 % (ref 11.5–14.6)

## 2012-11-29 NOTE — Progress Notes (Signed)
Quick Note:  Please make copy of labs for patient visit. ______ 

## 2012-12-01 ENCOUNTER — Encounter: Payer: Self-pay | Admitting: Cardiology

## 2012-12-01 ENCOUNTER — Ambulatory Visit (INDEPENDENT_AMBULATORY_CARE_PROVIDER_SITE_OTHER): Payer: Medicare Other | Admitting: Cardiology

## 2012-12-01 VITALS — BP 126/78 | HR 69 | Ht 62.0 in | Wt 154.4 lb

## 2012-12-01 DIAGNOSIS — E7800 Pure hypercholesterolemia, unspecified: Secondary | ICD-10-CM

## 2012-12-01 DIAGNOSIS — E039 Hypothyroidism, unspecified: Secondary | ICD-10-CM

## 2012-12-01 DIAGNOSIS — E0590 Thyrotoxicosis, unspecified without thyrotoxic crisis or storm: Secondary | ICD-10-CM

## 2012-12-01 DIAGNOSIS — E119 Type 2 diabetes mellitus without complications: Secondary | ICD-10-CM

## 2012-12-01 NOTE — Progress Notes (Signed)
Julie Norton Date of Birth:  01-29-1930 [facility] [address] [place], [place]  [address] [phone]         Fax   [phone]  History of Present Illness: This pleasant 79 year old woman is seen for a scheduled followup office visit. She has a past history of hypercholesterolemia. She also has a history of breast cancer. She finished radiation therapy in November 2012. Her surgeon is Dr. Jamey Ripa. The patient elected not to go on tamoxifen. When we last saw her she was complaining of some right upper quadrant discomfort. However we went ahead and did an gallbladder ultrasound on 11/16/11 which was negative for cholelithiasis. Symptoms have improved.  Since last visit the patient has had more problems with leg and back pain.  She is seeing [facility] orth[facility] and has had several steroid shots which have partially helped her symptoms although they have caused her to have problems with hyperglycemia and sleep disturbance.  Current Outpatient Prescriptions  Medication Sig Dispense Refill  . aspirin 325 MG EC tablet Take 325 mg by mouth daily.      Marland Kitchen atorvastatin (LIPITOR) 10 MG tablet Take 0.5 tablets (5 mg total) by mouth every other day.  90 tablet  3  . Blood Glucose Monitoring Suppl (ONE TOUCH ULTRA 2) W/DEVICE KIT       . Calcium Carbonate-Vitamin D (CALCIUM 600+D) 600-200 MG-UNIT TABS Take 2 tablets by mouth daily.       Marland Kitchen ezetimibe (ZETIA) 10 MG tablet Take 1 tablet (10 mg total) by mouth daily.  30 tablet  3  . glucosamine-chondroitin 500-400 MG tablet Take 2 tablets by mouth daily.       Marland Kitchen glucose blood (ONE TOUCH ULTRA TEST) test strip Use as instructed  100 each  11  . levothyroxine (SYNTHROID, LEVOTHROID) 100 MCG tablet TAKE 1 TABLET BY MOUTH EVERY DAY 6 OF 7 DAYS  30 tablet  10  . metFORMIN (GLUCOPHAGE) 500 MG tablet Take 1 tablet (500 mg total) by mouth daily with breakfast.  30 tablet  6  . omeprazole (PRILOSEC) 20 MG capsule Take 20 mg by  mouth as needed.       Letta Pate DELICA LANCETS MISC CHECK ONCE A DAY  100 each  0  . traMADol (ULTRAM) 50 MG tablet Take 50 mg by mouth as needed for pain.      . Cholecalciferol (VITAMIN D PO) Take by mouth.         No current facility-administered medications for this visit.    Allergies  Allergen Reactions  . Bactrim Nausea And Vomiting  . Dilaudid (Hydromorphone Hcl)   . Hydrocodone Nausea And Vomiting  . Macrodantin Nausea And Vomiting    Patient Active Problem List   Diagnosis Date Noted  . Hypercholesterolemia 11/28/2010    Priority: High  . Hypothyroidism 11/28/2010    Priority: Medium  . Abdominal pain 11/11/2011  . Diabetes mellitus 11/11/2011  . DCIS, left, central, receptor + 01/12/2011  . Postmenopausal state 11/28/2010  . Osteoarthritis 11/28/2010  . Dyspepsia 11/28/2010    History  Smoking status  . Never Smoker   Smokeless tobacco  . Never Used    History  Alcohol Use No    Family History  Problem Relation Age of Onset  . Pneumonia Father   . Heart attack Mother   . Hypertension Mother   . Stroke Mother   . Diabetes Mother   . Aortic aneurysm Mother     abdominal  aortic aneurysm  . Cancer Mother     bladder  . Aortic aneurysm Sister     abdominal aortic aneurysm    Review of Systems: Constitutional: no fever chills diaphoresis or fatigue or change in weight.  Head and neck: no hearing loss, no epistaxis, no photophobia or visual disturbance. Respiratory: No cough, shortness of breath or wheezing. Cardiovascular: No chest pain peripheral edema, palpitations. Gastrointestinal: No abdominal distention, no abdominal pain, no change in bowel habits hematochezia or melena. Genitourinary: No dysuria, no frequency, no urgency, no nocturia. Musculoskeletal:No arthralgias, no back pain, no gait disturbance or myalgias. Neurological: No dizziness, no headaches, no numbness, no seizures, no syncope, no weakness, no tremors. Hematologic: No  lymphadenopathy, no easy bruising. Psychiatric: No confusion, no hallucinations, no sleep disturbance.    Physical Exam: Filed Vitals:   12/01/12 1519  BP: 126/78  Pulse: 69   the general appearance reveals a well-developed elderly middle-aged woman in no acute distress.The head and neck exam reveals pupils equal and reactive.  Extraocular movements are full.  There is no scleral icterus.  The mouth and pharynx are normal.  The neck is supple.  The carotids reveal no bruits.  The jugular venous pressure is normal.  The  thyroid is not enlarged.  There is no lymphadenopathy.  The chest is clear to percussion and auscultation.  There are no rales or rhonchi.  Expansion of the chest is symmetrical.  The precordium is quiet.  The first heart sound is normal.  The second heart sound is physiologically split.  There is no murmur gallop rub or click.  There is no abnormal lift or heave.  The abdomen is soft and nontender.  The bowel sounds are normal.  The liver and spleen are not enlarged.  There are no abdominal masses.  There are no abdominal bruits.  Extremities reveal good pedal pulses.  There is no phlebitis or edema.  There is no cyanosis or clubbing.  Strength is normal and symmetrical in all extremities.  There is no lateralizing weakness.  There are no sensory deficits.  The skin is warm and dry.  There is no rash.  EKG shows normal sinus rhythm with minor nonspecific ST abnormality   Assessment / Plan: Continue on same medication but work harder on diet and I encouraged her to get some regular exercise daily.  She has a home treadmill which she will try to use it again. Recheck in 6 months for office visit lipid panel hepatic function panel basal metabolic panel TSH and free T4.

## 2012-12-01 NOTE — Patient Instructions (Addendum)
Your physician recommends that you continue on your current medications as directed. Please refer to the Current Medication list given to you today.  Your physician wants you to follow-up in: 6 months with fasting labs (lp/bmet/hfp/tsh/ft4) You will receive a reminder letter in the mail two months in advance. If you don't receive a letter, please call our office to schedule the follow-up appointment.  

## 2012-12-01 NOTE — Assessment & Plan Note (Signed)
The patient has had problems with lack of energy.  We did check thyroid function today and she is chemically euthyroid.  She is also clinically euthyroid.

## 2012-12-01 NOTE — Assessment & Plan Note (Signed)
Her blood sugars have been running higher since being on the intermittent steroid injections

## 2012-12-01 NOTE — Assessment & Plan Note (Signed)
The patient remains on Lipitor and ezetimibe.  We reviewed the patient's results today.  Unfortunately despite weight loss her lipids have not improved.  She admits to not speaking with a careful diet because she has felt so bad in her back.

## 2012-12-12 ENCOUNTER — Other Ambulatory Visit (INDEPENDENT_AMBULATORY_CARE_PROVIDER_SITE_OTHER): Payer: Self-pay | Admitting: Surgery

## 2012-12-12 DIAGNOSIS — Z853 Personal history of malignant neoplasm of breast: Secondary | ICD-10-CM

## 2012-12-13 ENCOUNTER — Telehealth: Payer: Self-pay | Admitting: Oncology

## 2013-01-11 ENCOUNTER — Ambulatory Visit
Admission: RE | Admit: 2013-01-11 | Discharge: 2013-01-11 | Disposition: A | Discharge status: Home / Self Care | Payer: Medicare Other | Source: Ambulatory Visit | Attending: Surgery | Admitting: Surgery

## 2013-01-11 DIAGNOSIS — Z853 Personal history of malignant neoplasm of breast: Secondary | ICD-10-CM

## 2013-02-08 ENCOUNTER — Other Ambulatory Visit: Payer: Self-pay

## 2013-02-14 ENCOUNTER — Other Ambulatory Visit: Payer: Self-pay | Admitting: Physical Medicine and Rehabilitation

## 2013-02-14 DIAGNOSIS — R0989 Other specified symptoms and signs involving the circulatory and respiratory systems: Secondary | ICD-10-CM

## 2013-02-17 ENCOUNTER — Other Ambulatory Visit: Payer: Self-pay | Admitting: Physical Medicine and Rehabilitation

## 2013-02-17 ENCOUNTER — Ambulatory Visit
Admission: RE | Admit: 2013-02-17 | Discharge: 2013-02-17 | Disposition: A | Discharge status: Home / Self Care | Payer: Medicare Other | Source: Ambulatory Visit | Attending: Physical Medicine and Rehabilitation | Admitting: Physical Medicine and Rehabilitation

## 2013-02-17 DIAGNOSIS — R0989 Other specified symptoms and signs involving the circulatory and respiratory systems: Secondary | ICD-10-CM

## 2013-02-21 ENCOUNTER — Other Ambulatory Visit: Payer: Self-pay | Admitting: Cardiology

## 2013-03-04 ENCOUNTER — Other Ambulatory Visit: Payer: Self-pay | Admitting: Cardiology

## 2013-03-07 ENCOUNTER — Other Ambulatory Visit: Payer: Self-pay | Admitting: Family Medicine

## 2013-03-15 ENCOUNTER — Ambulatory Visit: Payer: Medicare Other | Admitting: Oncology

## 2013-03-15 ENCOUNTER — Other Ambulatory Visit: Payer: Medicare Other | Admitting: Lab

## 2013-03-16 ENCOUNTER — Other Ambulatory Visit: Payer: Medicare Other | Admitting: Lab

## 2013-03-17 ENCOUNTER — Telehealth: Payer: Self-pay | Admitting: Cardiology

## 2013-03-17 NOTE — Telephone Encounter (Signed)
New message:  [place] the beach and was having some problems.  Pt had lab work done this am and was told to call us for results.  Call this number to get the results after 1:00 to get results and call patient regarding restarting Metformin.  Lab number [phone]   Pt number [phone]

## 2013-03-17 NOTE — Telephone Encounter (Signed)
Renal function is normal so okay to restart metformin

## 2013-03-17 NOTE — Telephone Encounter (Signed)
Left message to call back  

## 2013-03-17 NOTE — Telephone Encounter (Signed)
Had to stop Metformin secondary to having CT scan, was having nausea. Labs received BUN 8 and Cr 0.7 advised ok to resume Metformin.

## 2013-03-20 ENCOUNTER — Telehealth: Payer: Self-pay | Admitting: Oncology

## 2013-03-20 ENCOUNTER — Ambulatory Visit (HOSPITAL_BASED_OUTPATIENT_CLINIC_OR_DEPARTMENT_OTHER): Payer: Medicare Other | Admitting: Oncology

## 2013-03-20 ENCOUNTER — Other Ambulatory Visit (HOSPITAL_BASED_OUTPATIENT_CLINIC_OR_DEPARTMENT_OTHER): Payer: Medicare Other | Admitting: Lab

## 2013-03-20 ENCOUNTER — Encounter: Payer: Self-pay | Admitting: Oncology

## 2013-03-20 ENCOUNTER — Other Ambulatory Visit: Payer: Self-pay | Admitting: Cardiology

## 2013-03-20 VITALS — BP 122/73 | HR 73 | Temp 97.7°F | Resp 20 | Ht 62.0 in | Wt 147.7 lb

## 2013-03-20 DIAGNOSIS — D0590 Unspecified type of carcinoma in situ of unspecified breast: Secondary | ICD-10-CM

## 2013-03-20 DIAGNOSIS — D0510 Intraductal carcinoma in situ of unspecified breast: Secondary | ICD-10-CM

## 2013-03-20 DIAGNOSIS — D0512 Intraductal carcinoma in situ of left breast: Secondary | ICD-10-CM

## 2013-03-20 LAB — COMPREHENSIVE METABOLIC PANEL (CC13)
AST: 22 U/L (ref 5–34)
Albumin: 3.5 g/dL (ref 3.5–5.0)
Alkaline Phosphatase: 71 U/L (ref 40–150)
Potassium: 4.6 mEq/L (ref 3.5–5.1)
Sodium: 140 mEq/L (ref 136–145)
Total Protein: 6.4 g/dL (ref 6.4–8.3)

## 2013-03-20 LAB — CBC WITH DIFFERENTIAL/PLATELET
EOS%: 2.3 % (ref 0.0–7.0)
MCH: 31 pg (ref 25.1–34.0)
MCHC: 35.7 g/dL (ref 31.5–36.0)
MCV: 86.8 fL (ref 79.5–101.0)
MONO%: 9.4 % (ref 0.0–14.0)
RBC: 4.58 10*6/uL (ref 3.70–5.45)
RDW: 14.2 % (ref 11.2–14.5)

## 2013-03-20 NOTE — Telephone Encounter (Signed)
, °

## 2013-03-20 NOTE — Patient Instructions (Addendum)
Doing well, no clinical evidence of cancer  Keep follow up mammogram in 6 months from July  I will see you back in 1 year

## 2013-03-24 ENCOUNTER — Other Ambulatory Visit: Payer: Self-pay | Admitting: Cardiology

## 2013-03-26 NOTE — Progress Notes (Signed)
OFFICE PROGRESS NOTE  CC  Julie Covey, MD [address] [place] [address] Dr. Varney Baas Dr. Cassell Clement  DIAGNOSIS: 79 year old female with ductal carcinoma in situ of the left breast status post central lumpectomy on 02/24/2011.  PRIOR THERAPY:  #1 patient underwent a central lumpectomy with removal of the left nipple area LOC complex on 02/24/2011. The final pathology revealed intermediate to high-grade ductal carcinoma in situ. Tumor was ER +99% PR +100%.  #2 patient then received radiation therapy from 04/14/2011 2 05/08/2011  #3 patient declined antiestrogen therapy..  CURRENT THERAPY:Observation  INTERVAL HISTORY: CHE BELOW 79 y.o. female returns for Followup visit without any significant complaints except for a little bit of nausea and some tingling in her toes. She has no myalgias or arthralgias she has not noticed any problems with her breasts. Remainder of the review of systems is negative.  MEDICAL HISTORY: Past Medical History  Diagnosis Date  . H/O: hysterectomy 1978  . Hypercholesteremia   . Hypothyroidism   . Dyspepsia   . Chest pain     intermittent right upper quadrant discomfort radiating around her back      . Tendinitis     of the right arm and shoulder  . Abdominal gas pain     suspected -- may be related to intermittent right upper quadrant discomfort radiating around her back      . Breast mass     left breast  . GERD (gastroesophageal reflux disease)   . Cancer     left breast    ALLERGIES:  is allergic to bactrim; dilaudid; hydrocodone; and macrodantin.  MEDICATIONS:  Current Outpatient Prescriptions  Medication Sig Dispense Refill  . aspirin 325 MG EC tablet Take 325 mg by mouth daily.      Marland Kitchen atorvastatin (LIPITOR) 10 MG tablet TAKE 1/2 TABLET EVERY OTHER DAY OR AS DIRECTED  90 tablet  1  . Blood Glucose Monitoring Suppl (ONE TOUCH ULTRA 2) W/DEVICE KIT       . Calcium Carbonate-Vitamin D (CALCIUM 600+D)  600-200 MG-UNIT TABS Take 2 tablets by mouth daily.       . Cholecalciferol (VITAMIN D PO) Take by mouth.        . ezetimibe (ZETIA) 10 MG tablet Take 1 tablet (10 mg total) by mouth daily.  30 tablet  3  . glucosamine-chondroitin 500-400 MG tablet Take 2 tablets by mouth daily.       Marland Kitchen glucose blood (ONE TOUCH ULTRA TEST) test strip Use as instructed  100 each  11  . metFORMIN (GLUCOPHAGE) 500 MG tablet TAKE 1 TABLET (500 MG TOTAL) BY MOUTH DAILY WITH BREAKFAST.  30 tablet  11  . omeprazole (PRILOSEC) 20 MG capsule Take 20 mg by mouth as needed.       . ONE TOUCH ULTRA TEST test strip CHECK ONCE A DAY  50 each  2  . ONETOUCH DELICA LANCETS MISC CHECK ONCE A DAY  100 each  0  . traMADol (ULTRAM) 50 MG tablet Take 50 mg by mouth as needed for pain.      Marland Kitchen levothyroxine (SYNTHROID, LEVOTHROID) 100 MCG tablet TAKE 1 TABLET BY MOUTH EVERY DAY 6 OF 7 DAYS  30 tablet  11   No current facility-administered medications for this visit.    SURGICAL HISTORY:  Past Surgical History  Procedure Laterality Date  . Tonsillectomy and adenoidectomy  1950    at 79 years of age  . Breast reduction surgery    .  Bladder repair    . Lipoma excision    . Appendectomy  1978  . Breast lumpectomy  02/24/2011    central partial mastectomy - dr Jamey Ripa  . Abdominal hysterectomy      REVIEW OF SYSTEMS:  Pertinent items are noted in HPI.   PHYSICAL EXAMINATION: General appearance: alert, cooperative and appears stated age Resp: clear to auscultation bilaterally Cardio: regular rate and rhythm GI: soft, non-tender; bowel sounds normal; no masses,  no organomegaly Extremities: extremities normal, atraumatic, no cyanosis or edema Neurologic: Grossly normal Bilateral breast examination left breast reveals well-healed surgical lumpectomy scar without any nodularity or evidence of local recurrence. Right breast no masses or nipple discharge.  ECOG PERFORMANCE STATUS: 1 - Symptomatic but completely  ambulatory  Blood pressure 122/73, pulse 73, temperature 97.7 F (36.5 C), temperature source Oral, resp. rate 20, height 5\' 2"  (1.575 m), weight 147 lb 11.2 oz (66.996 kg).  LABORATORY DATA: Lab Results  Component Value Date   WBC 6.3 03/20/2013   HGB 14.2 03/20/2013   HCT 39.8 03/20/2013   MCV 86.8 03/20/2013   PLT 143* 03/20/2013      Chemistry      Component Value Date/Time   NA 140 03/20/2013 0910   NA 137 11/29/2012 0808   K 4.6 03/20/2013 0910   K 3.9 11/29/2012 0808   CL 105 11/29/2012 0808   CO2 28 03/20/2013 0910   CO2 24 11/29/2012 0808   BUN 10.7 03/20/2013 0910   BUN 11 11/29/2012 0808   CREATININE 0.8 03/20/2013 0910   CREATININE 0.6 11/29/2012 0808   CREATININE 0.68 11/27/2011 1856      Component Value Date/Time   CALCIUM 9.7 03/20/2013 0910   CALCIUM 9.1 11/29/2012 0808   ALKPHOS 71 03/20/2013 0910   ALKPHOS 61 11/29/2012 0808   AST 22 03/20/2013 0910   AST 19 11/29/2012 0808   ALT 18 03/20/2013 0910   ALT 19 11/29/2012 0808   BILITOT 1.19 03/20/2013 0910   BILITOT 1.0 11/29/2012 0808       RADIOGRAPHIC STUDIES:  No results found.  ASSESSMENT: 79 year old female with  #1 DCIS of the left breast status post lumpectomy followed by radiation therapy all completed on 05/08/2011. Thereafter she declined any chemoprevention with tamoxifen. Overall she is doing well and has no evidence of recurrent disease.   PLAN: Patient will continue to see me on a yearly basis.   All questions were answered. The patient knows to call the clinic with any problems, questions or concerns. We can certainly see the patient much sooner if necessary.  I spent 15 minutes counseling the patient face to face. The total time spent in the appointment was 30 minutes.    Drue Second, MD Medical/Oncology [facility] [phone] (beeper) [phone] (Office)

## 2013-03-29 NOTE — Telephone Encounter (Signed)
New Problem  Pt states she wants to know if the appt from 12/01/2012 was a yearly physical wit Dr. Patty Sermons. She is not certain.Marland Kitchen Please call back and advise.

## 2013-04-03 ENCOUNTER — Encounter: Payer: Self-pay | Admitting: Cardiology

## 2013-04-04 ENCOUNTER — Telehealth: Payer: Self-pay | Admitting: Family Medicine

## 2013-04-04 NOTE — Telephone Encounter (Signed)
Done

## 2013-04-04 NOTE — Telephone Encounter (Signed)
[place] the beach and [place] UTI. They adviised pt to fu w/ pcp to make sure it was cleared up. Do you want a 30 min? If so, there are none except SD. pls advise.

## 2013-04-04 NOTE — Telephone Encounter (Signed)
This can a be acute visit

## 2013-04-06 ENCOUNTER — Ambulatory Visit (INDEPENDENT_AMBULATORY_CARE_PROVIDER_SITE_OTHER): Payer: Medicare Other | Admitting: Family Medicine

## 2013-04-06 ENCOUNTER — Encounter: Payer: Self-pay | Admitting: Family Medicine

## 2013-04-06 VITALS — BP 112/68 | HR 88 | Temp 98.1°F | Wt 149.0 lb

## 2013-04-06 DIAGNOSIS — E119 Type 2 diabetes mellitus without complications: Secondary | ICD-10-CM

## 2013-04-06 DIAGNOSIS — N390 Urinary tract infection, site not specified: Secondary | ICD-10-CM

## 2013-04-06 LAB — POCT URINALYSIS DIPSTICK
Bilirubin, UA: NEGATIVE
Ketones, UA: NEGATIVE
Leukocytes, UA: NEGATIVE
Nitrite, UA: NEGATIVE
pH, UA: 6

## 2013-04-06 NOTE — Progress Notes (Signed)
  Subjective:    Patient ID: Julie Norton, female    DOB: 05-10-1930, 79 y.o.   MRN: [id number]  HPI Patient seen for a recent ER followup. She was down at the beach  Around mid September 2014 developed some nausea and malaise. Apparently her workup indicated UTI. She was started on Omnicef 300 mg twice a day for 7 days. Symptoms have mostly resolved at this time. She still some very mild nausea but no vomiting. No diarrhea. No abdominal pain. No dysuria this time. No abdominal or flank pain.  Past Medical History  Diagnosis Date  . H/O: hysterectomy 1978  . Hypercholesteremia   . Hypothyroidism   . Dyspepsia   . Chest pain     intermittent right upper quadrant discomfort radiating around her back      . Tendinitis     of the right arm and shoulder  . Abdominal gas pain     suspected -- may be related to intermittent right upper quadrant discomfort radiating around her back      . Breast mass     left breast  . GERD (gastroesophageal reflux disease)   . Cancer     left breast   Past Surgical History  Procedure Laterality Date  . Tonsillectomy and adenoidectomy  1950    at 79 years of age  . Breast reduction surgery    . Bladder repair    . Lipoma excision    . Appendectomy  1978  . Breast lumpectomy  02/24/2011    central partial mastectomy - dr Jamey Ripa  . Abdominal hysterectomy      reports that she has never smoked. She has never used smokeless tobacco. She reports that she does not drink alcohol or use illicit drugs. family history includes Aortic aneurysm in her mother and sister; Cancer in her mother; Diabetes in her mother; Heart attack in her mother; Hypertension in her mother; Pneumonia in her father; Stroke in her mother. Allergies  Allergen Reactions  . Bactrim Nausea And Vomiting  . Dilaudid [Hydromorphone Hcl]   . Hydrocodone Nausea And Vomiting  . Macrodantin Nausea And Vomiting      Review of Systems  Constitutional: Negative for fever, chills and  appetite change.  Respiratory: Negative for cough and shortness of breath.   Cardiovascular: Negative for chest pain.       Objective:   Physical Exam  Constitutional: She appears well-developed and well-nourished.  HENT:  Mouth/Throat: Oropharynx is clear and moist.  Cardiovascular: Normal rate and regular rhythm.   Pulmonary/Chest: Effort normal and breath sounds normal. No respiratory distress. She has no wheezes. She has no rales.  Musculoskeletal: She exhibits no edema.          Assessment & Plan:  Followup recent acute illness. Presumably UTI. We have not had a chance to review the full records at this time-they are on a disc and unable to open files.. Repeat urinalysis today reveals no significant abnormalities. No evidence for persistent infection.

## 2013-04-07 ENCOUNTER — Other Ambulatory Visit (INDEPENDENT_AMBULATORY_CARE_PROVIDER_SITE_OTHER): Payer: Medicare Other

## 2013-04-07 DIAGNOSIS — E119 Type 2 diabetes mellitus without complications: Secondary | ICD-10-CM

## 2013-04-07 DIAGNOSIS — Z0000 Encounter for general adult medical examination without abnormal findings: Secondary | ICD-10-CM

## 2013-04-07 LAB — HEPATIC FUNCTION PANEL
ALT: 16 U/L (ref 0–35)
AST: 21 U/L (ref 0–37)
Albumin: 3.8 g/dL (ref 3.5–5.2)
Alkaline Phosphatase: 62 U/L (ref 39–117)
Bilirubin, Direct: 0.1 mg/dL (ref 0.0–0.3)
Total Bilirubin: 1.2 mg/dL (ref 0.3–1.2)
Total Protein: 6.4 g/dL (ref 6.0–8.3)

## 2013-04-07 LAB — CBC WITH DIFFERENTIAL/PLATELET
Basophils Absolute: 0 10*3/uL (ref 0.0–0.1)
Basophils Relative: 0.3 % (ref 0.0–3.0)
Eosinophils Absolute: 0.1 10*3/uL (ref 0.0–0.7)
Eosinophils Relative: 1.8 % (ref 0.0–5.0)
HCT: 41.1 % (ref 36.0–46.0)
Hemoglobin: 13.9 g/dL (ref 12.0–15.0)
Lymphocytes Relative: 22.2 % (ref 12.0–46.0)
Lymphs Abs: 1.3 10*3/uL (ref 0.7–4.0)
MCHC: 33.7 g/dL (ref 30.0–36.0)
MCV: 90.6 fl (ref 78.0–100.0)
Monocytes Absolute: 0.5 10*3/uL (ref 0.1–1.0)
Monocytes Relative: 8.1 % (ref 3.0–12.0)
Neutro Abs: 4 10*3/uL (ref 1.4–7.7)
Neutrophils Relative %: 67.6 % (ref 43.0–77.0)
Platelets: 150 10*3/uL (ref 150.0–400.0)
RBC: 4.54 Mil/uL (ref 3.87–5.11)
RDW: 14 % (ref 11.5–14.6)
WBC: 6 10*3/uL (ref 4.5–10.5)

## 2013-04-07 LAB — LIPID PANEL
Cholesterol: 165 mg/dL (ref 0–200)
HDL: 51 mg/dL (ref >39.00)
LDL Cholesterol: 89 mg/dL (ref 0–99)
Total CHOL/HDL Ratio: 3
Triglycerides: 127 mg/dL (ref 0.0–149.0)
VLDL: 25.4 mg/dL (ref 0.0–40.0)

## 2013-04-07 LAB — BASIC METABOLIC PANEL
BUN: 11 mg/dL (ref 6–23)
CO2: 28 mEq/L (ref 19–32)
Calcium: 9.4 mg/dL (ref 8.4–10.5)
Chloride: 110 mEq/L (ref 96–112)
Creatinine, Ser: 0.7 mg/dL (ref 0.4–1.2)
GFR: 92.98 mL/min (ref >60.00)
Glucose, Bld: 147 mg/dL — ABNORMAL HIGH (ref 70–99)
Potassium: 4.4 mEq/L (ref 3.5–5.1)
Sodium: 143 mEq/L (ref 135–145)

## 2013-04-07 LAB — HEMOGLOBIN A1C: Hgb A1c MFr Bld: 9.2 % — ABNORMAL HIGH (ref 4.6–6.5)

## 2013-04-07 LAB — POCT URINALYSIS DIPSTICK
Blood, UA: NEGATIVE
Ketones, UA: NEGATIVE
Protein, UA: NEGATIVE
Spec Grav, UA: 1.02
Urobilinogen, UA: 0.2
pH, UA: 6

## 2013-04-07 LAB — MICROALBUMIN / CREATININE URINE RATIO
Creatinine,U: 126.7 mg/dL
Microalb, Ur: 0.9 mg/dL (ref 0.0–1.9)

## 2013-04-07 LAB — TSH: TSH: 0.11 u[IU]/mL — ABNORMAL LOW (ref 0.35–5.50)

## 2013-04-21 ENCOUNTER — Ambulatory Visit (INDEPENDENT_AMBULATORY_CARE_PROVIDER_SITE_OTHER): Payer: Medicare Other | Admitting: Family Medicine

## 2013-04-21 ENCOUNTER — Encounter: Payer: Self-pay | Admitting: Family Medicine

## 2013-04-21 VITALS — BP 122/68 | HR 74 | Temp 98.0°F | Wt 147.0 lb

## 2013-04-21 DIAGNOSIS — Z23 Encounter for immunization: Secondary | ICD-10-CM

## 2013-04-21 DIAGNOSIS — E039 Hypothyroidism, unspecified: Secondary | ICD-10-CM

## 2013-04-21 DIAGNOSIS — E119 Type 2 diabetes mellitus without complications: Secondary | ICD-10-CM

## 2013-04-21 DIAGNOSIS — Z0000 Encounter for general adult medical examination without abnormal findings: Secondary | ICD-10-CM

## 2013-04-21 MED ORDER — LEVOTHYROXINE SODIUM 88 MCG PO TABS: 88.0000 ug | ORAL_TABLET | Freq: Every day | ORAL | Status: DC

## 2013-04-21 NOTE — Progress Notes (Signed)
Subjective:    Patient ID: Julie Norton, female    DOB: Apr 26, 1930, 79 y.o.   MRN: [id number]  HPI Patient seen for Medicare wellness exam and medical followup Medical problems include history of type 2 diabetes, history of breast cancer, hyperlipidemia, hypothyroidism, and osteoarthritis. She still sees gynecologist and has still been getting Pap smears but has had previous hysterectomy. She is followed by oncologist and had recent mammogram in June.  She's had some recent back difficulties and has had a couple of epidurals as well as some intramuscular steroids. For that reason, her blood sugars been somewhat elevated. Previous to this, she's had excellent control. No polydipsia or polyuria. Her lipids are treated with Zetia and Lipitor  Past Medical History  Diagnosis Date  . H/O: hysterectomy 1978  . Hypercholesteremia   . Hypothyroidism   . Dyspepsia   . Chest pain     intermittent right upper quadrant discomfort radiating around her back      . Tendinitis     of the right arm and shoulder  . Abdominal gas pain     suspected -- may be related to intermittent right upper quadrant discomfort radiating around her back      . Breast mass     left breast  . GERD (gastroesophageal reflux disease)   . Cancer     left breast   Past Surgical History  Procedure Laterality Date  . Tonsillectomy and adenoidectomy  1950    at 79 years of age  . Breast reduction surgery    . Bladder repair    . Lipoma excision    . Appendectomy  1978  . Breast lumpectomy  02/24/2011    central partial mastectomy - dr Jamey Ripa  . Abdominal hysterectomy      reports that she has never smoked. She has never used smokeless tobacco. She reports that she does not drink alcohol or use illicit drugs. family history includes Aortic aneurysm in her mother and sister; Cancer in her mother; Diabetes in her mother; Heart attack in her mother; Hypertension in her mother; Pneumonia in her father; Stroke in her  mother. Allergies  Allergen Reactions  . Bactrim Nausea And Vomiting  . Dilaudid [Hydromorphone Hcl]   . Hydrocodone Nausea And Vomiting  . Macrodantin Nausea And Vomiting   1.  Risk factors based on Past Medical , Social, and Family history reviewed and as indicated above. 2.  Limitations in physical activities stays very active. No recent falls 3.  Depression/mood no depression or anxiety issues 4.  Hearing no hearing deficits 5.  ADLs independent in all  6.  Cognitive function (orientation to time and place, language, writing, speech,memory) no memory deficits. Language and judgment intact 7.  Home Safety no issues 8.  Height, weight, and visual acuity. All stable 9.  Counseling discussed weightbearing exercise as well as adequate calcium and vitamin D consumption which she has not taken consistently 10. Recommendation of preventive services. Confirm date of last tetanus and date of last colonoscopy  11. Labs based on risk factors labs already obtained and reviewed 12. Care Plan as above    Review of Systems  Constitutional: Negative for fever, activity change, appetite change, fatigue and unexpected weight change.  HENT: Negative for ear pain, hearing loss, sore throat and trouble swallowing.   Eyes: Negative for visual disturbance.  Respiratory: Negative for cough and shortness of breath.   Cardiovascular: Negative for chest pain and palpitations.  Gastrointestinal: Negative for abdominal pain, diarrhea,  constipation and blood in stool.  Genitourinary: Negative for dysuria and hematuria.  Musculoskeletal: Negative for arthralgias, back pain and myalgias.  Skin: Negative for rash.  Neurological: Negative for dizziness, syncope and headaches.  Hematological: Negative for adenopathy.  Psychiatric/Behavioral: Negative for confusion and dysphoric mood.       Objective:   Physical Exam  Constitutional: She is oriented to person, place, and time. She appears well-developed and  well-nourished.  HENT:  Head: Normocephalic and atraumatic.  Right Ear: External ear normal.  Left Ear: External ear normal.  Eyes: EOM are normal. Pupils are equal, round, and reactive to light.  Neck: Normal range of motion. Neck supple. No thyromegaly present.  Cardiovascular: Normal rate, regular rhythm and normal heart sounds.   No murmur heard. Pulmonary/Chest: Breath sounds normal. No respiratory distress. She has no wheezes. She has no rales.  Abdominal: Soft. Bowel sounds are normal. She exhibits no distension and no mass. There is no tenderness. There is no rebound and no guarding.  Genitourinary:  Deferred per GYN  Musculoskeletal: Normal range of motion. She exhibits no edema.  Lymphadenopathy:    She has no cervical adenopathy.  Neurological: She is alert and oriented to person, place, and time. She displays normal reflexes. No cranial nerve deficit.  Skin: No rash noted.  Psychiatric: She has a normal mood and affect. Her behavior is normal. Judgment and thought content normal.          Assessment & Plan:  #1 health maintenance. Flu vaccine given. Confirm date of last tetanus and colonoscopy #2 hypothyroidism over replaced. Reduce Synthroid 88 mcg once daily and recheck TSH 3 months #3 type 2 diabetes with recent poor control A1c 9.2% probably exacerbated by steroids. Recheck in 3 months. Titrate medications then if not to goal #4 hyperlipidemia adequate control by recent labs

## 2013-04-24 ENCOUNTER — Telehealth: Payer: Self-pay | Admitting: Family Medicine

## 2013-04-24 NOTE — Telephone Encounter (Signed)
I would go ahead with new dose and repeat TSH in 3 months.

## 2013-04-24 NOTE — Telephone Encounter (Signed)
Caller: Julie Norton/Patient; Phone: [phone] Ext:[phone]; Reason for Call: Pt f/u regarding OV w/ Dr Caryl Never on 10-17 for Thyroid check up.  Pt failed to inform MD she was only taking Synthroid 6 out of 7 days.  MD changed her Synthroid to 7 days a week.  Pt would like to ask MD if RX change is still needed since she was only taking 6 days instead of 7.  Please review w/ MD and f/u w/ Pt.

## 2013-04-25 NOTE — Telephone Encounter (Signed)
Pt informed

## 2013-06-05 ENCOUNTER — Encounter: Payer: Self-pay | Admitting: Family Medicine

## 2013-06-06 ENCOUNTER — Other Ambulatory Visit (INDEPENDENT_AMBULATORY_CARE_PROVIDER_SITE_OTHER): Payer: Self-pay | Admitting: Surgery

## 2013-06-06 DIAGNOSIS — R921 Mammographic calcification found on diagnostic imaging of breast: Secondary | ICD-10-CM

## 2013-06-15 ENCOUNTER — Other Ambulatory Visit (INDEPENDENT_AMBULATORY_CARE_PROVIDER_SITE_OTHER): Payer: Medicare Other | Admitting: Family Medicine

## 2013-06-15 ENCOUNTER — Telehealth: Payer: Self-pay | Admitting: Family Medicine

## 2013-06-15 ENCOUNTER — Ambulatory Visit (INDEPENDENT_AMBULATORY_CARE_PROVIDER_SITE_OTHER): Payer: Medicare Other | Admitting: Family Medicine

## 2013-06-15 DIAGNOSIS — R300 Dysuria: Secondary | ICD-10-CM

## 2013-06-15 LAB — POCT URINALYSIS DIPSTICK
Ketones, UA: NEGATIVE
Spec Grav, UA: >=1.03
pH, UA: 6.5

## 2013-06-15 MED ORDER — CIPROFLOXACIN HCL 500 MG PO TABS: 500.0000 mg | ORAL_TABLET | Freq: Two times a day (BID) | ORAL | Status: DC

## 2013-06-15 NOTE — Telephone Encounter (Signed)
Left message on home and cell phones for patient to return call . Per dr.Burchette pt can come in for a urine test

## 2013-06-15 NOTE — Telephone Encounter (Signed)
Pt thinks she has another bladder infection and would like to have a urine test done today. Can I sch urine test only.

## 2013-06-15 NOTE — Telephone Encounter (Signed)
Pt came in the office gave a urine sample. Pt was giving Cipro per Dr. Caryl Never.

## 2013-06-16 NOTE — Progress Notes (Signed)
Pt presented with one day of urine frequency and mild burning. No fever, back pain, nausea, or fever. We instructed her to come in for urine only  Urine shows leukocytes and RBCs-on dip. Urine cx sent  A:  Probable UTI P:  Urine cx and start Cipro 500 mg po bid for 7 days

## 2013-06-19 LAB — URINE CULTURE: Colony Count: >=100000

## 2013-07-05 ENCOUNTER — Other Ambulatory Visit: Payer: Self-pay | Admitting: Cardiology

## 2013-07-05 ENCOUNTER — Telehealth: Payer: Self-pay | Admitting: Family Medicine

## 2013-07-05 ENCOUNTER — Other Ambulatory Visit: Payer: Self-pay

## 2013-07-05 MED ORDER — EZETIMIBE 10 MG PO TABS: 10.0000 mg | ORAL_TABLET | Freq: Every day | ORAL | Status: DC

## 2013-07-05 NOTE — Telephone Encounter (Signed)
RX sent to pharmacy  

## 2013-07-05 NOTE — Telephone Encounter (Signed)
Pt was instructed by [facility] to call and inform us that she really needs her ezetimibe (ZETIA) 10 MG tablet refilled today, it is being sent over by Dr. Patty Sermons.  If pt gets it refilled today it will cost $45, if not filled by today it will cost over $200.  Please advise.

## 2013-07-11 ENCOUNTER — Ambulatory Visit
Admission: RE | Admit: 2013-07-11 | Discharge: 2013-07-11 | Disposition: A | Discharge status: Home / Self Care | Payer: Medicare Other | Source: Ambulatory Visit | Attending: Surgery | Admitting: Surgery

## 2013-07-11 DIAGNOSIS — R921 Mammographic calcification found on diagnostic imaging of breast: Secondary | ICD-10-CM

## 2013-07-20 ENCOUNTER — Other Ambulatory Visit (INDEPENDENT_AMBULATORY_CARE_PROVIDER_SITE_OTHER): Payer: Medicare Other

## 2013-07-20 DIAGNOSIS — E039 Hypothyroidism, unspecified: Secondary | ICD-10-CM

## 2013-07-20 DIAGNOSIS — E119 Type 2 diabetes mellitus without complications: Secondary | ICD-10-CM

## 2013-07-20 LAB — HEMOGLOBIN A1C: Hgb A1c MFr Bld: 8.1 % — ABNORMAL HIGH (ref 4.6–6.5)

## 2013-07-20 LAB — TSH: TSH: 0.07 u[IU]/mL — ABNORMAL LOW (ref 0.35–5.50)

## 2013-07-21 ENCOUNTER — Other Ambulatory Visit: Payer: Self-pay

## 2013-07-21 ENCOUNTER — Other Ambulatory Visit: Payer: Self-pay | Admitting: Family Medicine

## 2013-07-21 DIAGNOSIS — E119 Type 2 diabetes mellitus without complications: Secondary | ICD-10-CM

## 2013-07-21 DIAGNOSIS — E039 Hypothyroidism, unspecified: Secondary | ICD-10-CM

## 2013-07-21 MED ORDER — LEVOTHYROXINE SODIUM 75 MCG PO TABS: 75.0000 ug | ORAL_TABLET | Freq: Every day | ORAL | Status: DC

## 2013-07-21 MED ORDER — METFORMIN HCL 500 MG PO TABS: 500.0000 mg | ORAL_TABLET | Freq: Two times a day (BID) | ORAL | Status: DC

## 2013-08-18 ENCOUNTER — Encounter: Payer: Self-pay | Admitting: Cardiology

## 2013-09-21 ENCOUNTER — Ambulatory Visit (HOSPITAL_COMMUNITY)
Admission: RE | Admit: 2013-09-21 | Discharge: 2013-09-21 | Disposition: A | Discharge status: Home / Self Care | Payer: Medicare Other | Source: Ambulatory Visit | Attending: Cardiovascular Disease | Admitting: Cardiovascular Disease

## 2013-09-21 ENCOUNTER — Other Ambulatory Visit (HOSPITAL_COMMUNITY): Payer: Self-pay | Admitting: Physical Medicine and Rehabilitation

## 2013-09-21 DIAGNOSIS — M7989 Other specified soft tissue disorders: Secondary | ICD-10-CM

## 2013-09-21 NOTE — Progress Notes (Signed)
Right Lower Extremity Venous Duplex Completed. Negative for DVT and SVT. °Brianna L Mazza,RVT °

## 2013-10-17 ENCOUNTER — Telehealth: Payer: Self-pay | Admitting: Cardiology

## 2013-10-17 NOTE — Telephone Encounter (Signed)
New message    Patient need to discuss future appointment for her & husband.

## 2013-10-17 NOTE — Telephone Encounter (Signed)
Spoke with patient and she and her husband have been seeing PCP regularly. They are both doing ok cardiac wise. She is going to check and see when her PCP wants to see her back and call back for an appointment with  Dr. Mare Ferrari so she does not have to have appointments close together.

## 2013-11-01 ENCOUNTER — Telehealth: Payer: Self-pay | Admitting: Family Medicine

## 2013-11-01 NOTE — Telephone Encounter (Signed)
Spoke with patient.

## 2013-11-01 NOTE — Telephone Encounter (Signed)
Pt is requesting to talk with montrice concerning another md ?pt would not elaborate

## 2013-11-16 ENCOUNTER — Encounter: Payer: Self-pay | Admitting: Family Medicine

## 2013-11-16 ENCOUNTER — Ambulatory Visit (INDEPENDENT_AMBULATORY_CARE_PROVIDER_SITE_OTHER): Payer: Medicare Other | Admitting: Family Medicine

## 2013-11-16 VITALS — BP 124/72 | HR 75 | Temp 97.9°F | Wt 152.0 lb

## 2013-11-16 DIAGNOSIS — M79609 Pain in unspecified limb: Secondary | ICD-10-CM

## 2013-11-16 DIAGNOSIS — M79604 Pain in right leg: Secondary | ICD-10-CM

## 2013-11-16 DIAGNOSIS — M79605 Pain in left leg: Principal | ICD-10-CM

## 2013-11-16 DIAGNOSIS — I809 Phlebitis and thrombophlebitis of unspecified site: Secondary | ICD-10-CM

## 2013-11-16 MED ORDER — PRAMIPEXOLE DIHYDROCHLORIDE 0.125 MG PO TABS: ORAL_TABLET | ORAL | Status: DC

## 2013-11-16 NOTE — Patient Instructions (Signed)
Restless Legs Syndrome Restless legs syndrome is a movement disorder. It may also be called a sensori-motor disorder.  CAUSES  No one knows what specifically causes restless legs syndrome, but it tends to run in families. It is also more common in people with low iron, in pregnancy, in people who need dialysis, and those with nerve damage (neuropathy).Some medications may make restless legs syndrome worse.Those medications include drugs to treat high blood pressure, some heart conditions, nausea, colds, allergies, and depression. SYMPTOMS Symptoms include uncomfortable sensations in the legs. These leg sensations are worse during periods of inactivity or rest. They are also worse while sitting or lying down. Individuals that have the disorder describe sensations in the legs that feel like:  Pulling.  Drawing.  Crawling.  Worming.  Boring.  Tingling.  Pins and needles.  Prickling.  Pain. The sensations are usually accompanied by an overwhelming urge to move the legs. Sudden muscle jerks may also occur. Movement provides temporary relief from the discomfort. In rare cases, the arms may also be affected. Symptoms may interfere with going to sleep (sleep onset insomnia). Restless legs syndrome may also be related to periodic limb movement disorder (PLMD). PLMD is another more common motor disorder. It also causes interrupted sleep. The symptoms from PLMD usually occur most often when you are awake. TREATMENT  Treatment for restless legs syndrome is symptomatic. This means that the symptoms are treated.   Massage and cold compresses may provide temporary relief.  Walk, stretch, or take a cold or hot bath.  Get regular exercise and a good night's sleep.  Avoid caffeine, alcohol, nicotine, and medications that can make it worse.  Do activities that provide mental stimulation like discussions, needlework, and video games. These may be helpful if you are not able to walk or  stretch. Some medications are effective in relieving the symptoms. However, many of these medications have side effects. Ask your caregiver about medications that may help your symptoms. Correcting iron deficiency may improve symptoms for some patients. Document Released: 06/12/2002 Document Revised: 09/14/2011 Document Reviewed: 09/18/2010 [facility] Patient Information 2014 [place].  Start with Mirapex 0.125 mg one at night and may increase to 2 at night after 3-4 days if no relief.

## 2013-11-16 NOTE — Progress Notes (Signed)
Subjective:    Patient ID: Julie Norton, female    DOB: 03-Jul-1930, 80 y.o.   MRN: [id number]  Leg Pain  Pertinent negatives include no numbness.   Patient seen with bilateral leg pain off and on for months. Her symptoms are almost exclusively at night. She states she has history of restless leg syndrome. She's not sure the symptoms are the same.  Does have aching pain and sensation of wanting to move them.  She does sometimes get relief with walking. She had venous Dopplers back in March of this year which showed no DVT. She had ankle-brachial index with exercise back in August of 2014 which was normal. She is not reporting any claudication-type symptoms. She denies any numbness. No weakness. She has some chronic back difficulties in the past but is not describing any lumbar stenosis issues. She takes tramadol without much relief. No loss of urine or stool incontinence  Past Medical History  Diagnosis Date  . H/O: hysterectomy 1978  . Hypercholesteremia   . Hypothyroidism   . Dyspepsia   . Chest pain     intermittent right upper quadrant discomfort radiating around her back      . Tendinitis     of the right arm and shoulder  . Abdominal gas pain     suspected -- may be related to intermittent right upper quadrant discomfort radiating around her back      . Breast mass     left breast  . GERD (gastroesophageal reflux disease)   . Cancer     left breast   Past Surgical History  Procedure Laterality Date  . Tonsillectomy and adenoidectomy  1950    at 80 years of age  . Breast reduction surgery    . Bladder repair    . Lipoma excision    . Appendectomy  1978  . Breast lumpectomy  02/24/2011    central partial mastectomy - dr Margot Chimes  . Abdominal hysterectomy      reports that she has never smoked. She has never used smokeless tobacco. She reports that she does not drink alcohol or use illicit drugs. family history includes Aortic aneurysm in her mother and sister; Cancer in  her mother; Diabetes in her mother; Heart attack in her mother; Hypertension in her mother; Pneumonia in her father; Stroke in her mother. Allergies  Allergen Reactions  . Bactrim Nausea And Vomiting  . Dilaudid [Hydromorphone Hcl]   . Hydrocodone Nausea And Vomiting  . Macrodantin Nausea And Vomiting      Review of Systems  Constitutional: Negative for fever, chills, appetite change and unexpected weight change.  Respiratory: Negative for shortness of breath.   Cardiovascular: Negative for chest pain.  Gastrointestinal: Negative for abdominal pain.  Musculoskeletal: Positive for back pain. Negative for gait problem and joint swelling.  Neurological: Negative for weakness and numbness.  Hematological: Negative for adenopathy.       Objective:   Physical Exam  Constitutional: She appears well-developed and well-nourished.  Cardiovascular: Normal rate and regular rhythm.   Pulmonary/Chest: Effort normal and breath sounds normal. No respiratory distress. She has no wheezes. She has no rales.  Musculoskeletal: She exhibits no edema.  2+ dorsalis pedis pulses bilaterally. Both feet are warm to touch with good capillary refill  Neurological:  Full-strength lower extremities. 2+ reflexes knee and ankle bilaterally. Normal sensory to touch.  Skin:  She has some very minimal erythema right anterior calf region with somewhat prominent indurated vein very superficial  Assessment & Plan:  #1 bilateral leg pain. Symptoms are worse at light. Normal ABIs recently. She's not describing claudication symptoms. Question restless leg syndrome vs neuropathic. Avoid caffeine at night. Trial of Mirapex 0.125 mg one to 2 at night and reassess 2 weeks.  Consider trial of Gabapentin if not responding to above. #2 probable superficial phlebitis right leg. Try heating pad and continue aspirin. Recent Doppler negative for DVT

## 2013-11-16 NOTE — Progress Notes (Signed)
Pre visit review using our clinic review tool, if applicable. No additional management support is needed unless otherwise documented below in the visit note. 

## 2013-11-16 NOTE — Progress Notes (Signed)
   Subjective:    Patient ID: Julie Norton, female    DOB: Oct 20, 1929, 80 y.o.   MRN: [id number]  HPI    Review of Systems     Objective:   Physical Exam        Assessment & Plan:

## 2013-11-30 ENCOUNTER — Ambulatory Visit: Payer: Medicare Other | Admitting: Family Medicine

## 2013-12-08 ENCOUNTER — Other Ambulatory Visit: Payer: Self-pay | Admitting: Adult Health

## 2013-12-08 DIAGNOSIS — R921 Mammographic calcification found on diagnostic imaging of breast: Secondary | ICD-10-CM

## 2013-12-13 ENCOUNTER — Telehealth: Payer: Self-pay | Admitting: Cardiology

## 2013-12-13 NOTE — Telephone Encounter (Signed)
New message     Returning Melinda's call from 3wks ago

## 2013-12-13 NOTE — Telephone Encounter (Signed)
Scheduled appointment for patient.

## 2013-12-19 ENCOUNTER — Ambulatory Visit (INDEPENDENT_AMBULATORY_CARE_PROVIDER_SITE_OTHER): Payer: Medicare Other | Admitting: Family Medicine

## 2013-12-19 ENCOUNTER — Encounter: Payer: Self-pay | Admitting: Family Medicine

## 2013-12-19 VITALS — BP 124/76 | HR 74 | Temp 98.0°F | Wt 152.0 lb

## 2013-12-19 DIAGNOSIS — E039 Hypothyroidism, unspecified: Secondary | ICD-10-CM

## 2013-12-19 DIAGNOSIS — E7800 Pure hypercholesterolemia, unspecified: Secondary | ICD-10-CM

## 2013-12-19 DIAGNOSIS — E119 Type 2 diabetes mellitus without complications: Secondary | ICD-10-CM

## 2013-12-19 DIAGNOSIS — R109 Unspecified abdominal pain: Secondary | ICD-10-CM

## 2013-12-19 LAB — CBC WITH DIFFERENTIAL/PLATELET
Basophils Absolute: 0 10*3/uL (ref 0.0–0.1)
Basophils Relative: 0.4 % (ref 0.0–3.0)
EOS ABS: 0.2 10*3/uL (ref 0.0–0.7)
Eosinophils Relative: 2.5 % (ref 0.0–5.0)
HCT: 39 % (ref 36.0–46.0)
HEMOGLOBIN: 13.2 g/dL (ref 12.0–15.0)
LYMPHS PCT: 25.5 % (ref 12.0–46.0)
Lymphs Abs: 1.6 10*3/uL (ref 0.7–4.0)
MCHC: 33.8 g/dL (ref 30.0–36.0)
MCV: 87.3 fl (ref 78.0–100.0)
Monocytes Absolute: 0.5 10*3/uL (ref 0.1–1.0)
Monocytes Relative: 8.5 % (ref 3.0–12.0)
NEUTROS ABS: 3.9 10*3/uL (ref 1.4–7.7)
NEUTROS PCT: 63.1 % (ref 43.0–77.0)
PLATELETS: 164 10*3/uL (ref 150.0–400.0)
RBC: 4.47 Mil/uL (ref 3.87–5.11)
RDW: 14.4 % (ref 11.5–15.5)
WBC: 6.2 10*3/uL (ref 4.0–10.5)

## 2013-12-19 LAB — BASIC METABOLIC PANEL
BUN: 12 mg/dL (ref 6–23)
CALCIUM: 10 mg/dL (ref 8.4–10.5)
CO2: 26 mEq/L (ref 19–32)
CREATININE: 0.6 mg/dL (ref 0.4–1.2)
Chloride: 106 mEq/L (ref 96–112)
GFR: 94.49 mL/min (ref >60.00)
GLUCOSE: 118 mg/dL — AB (ref 70–99)
Potassium: 3.9 mEq/L (ref 3.5–5.1)
Sodium: 139 mEq/L (ref 135–145)

## 2013-12-19 LAB — HEMOGLOBIN A1C: Hgb A1c MFr Bld: 8.3 % — ABNORMAL HIGH (ref 4.6–6.5)

## 2013-12-19 LAB — TSH: TSH: 0.08 u[IU]/mL — AB (ref 0.35–4.50)

## 2013-12-19 NOTE — Progress Notes (Signed)
Pre visit review using our clinic review tool, if applicable. No additional management support is needed unless otherwise documented below in the visit note. 

## 2013-12-19 NOTE — Progress Notes (Signed)
Subjective:    Patient ID: Julie Norton, female    DOB: 1929/11/13, 80 y.o.   MRN: [id number]  Abdominal Pain Associated symptoms include constipation and diarrhea. Pertinent negatives include no dysuria, fever, nausea or vomiting.   Patient seen with history of intermittent abdominal cramping. She's actually had these symptoms off and on for several years. She relates about once per week sometimes less she has episodes which is last about 10-15 minutes of crampy abdominal pain. Symptoms are usually bilateral. She's not any bloody stools. She frequently has some transient diarrhea following episodes. Occasional constipation. She's had some symptoms off and on for many years like this. Denies any recent appetite or weight changes. No associated nausea vomiting. No clear triggers. No alleviating factors. No history of lactose or gluten intolerance. She had colonoscopy age 80 which is normal.  Has never had associated fever or chills. She's had previous hysterectomy and bilateral oophorectomy. She relates prior history of possible adhesions but no history of known small bowel obstruction. Questionable history of diverticulitis but the symptoms were different. Symptoms are usually very transient only lasting 15 or 20 minutes. Patient states she was treated for Helicobacter pylori positive antibody years ago and did see some improvement.  Past Medical History  Diagnosis Date  . H/O: hysterectomy 1978  . Hypercholesteremia   . Hypothyroidism   . Dyspepsia   . Chest pain     intermittent right upper quadrant discomfort radiating around her back      . Tendinitis     of the right arm and shoulder  . Abdominal gas pain     suspected -- may be related to intermittent right upper quadrant discomfort radiating around her back      . Breast mass     left breast  . GERD (gastroesophageal reflux disease)   . Cancer     left breast   Past Surgical History  Procedure Laterality Date  .  Tonsillectomy and adenoidectomy  1950    at 80 years of age  . Breast reduction surgery    . Bladder repair    . Lipoma excision    . Appendectomy  1978  . Breast lumpectomy  02/24/2011    central partial mastectomy - dr Margot Chimes  . Abdominal hysterectomy      reports that she has never smoked. She has never used smokeless tobacco. She reports that she does not drink alcohol or use illicit drugs. family history includes Aortic aneurysm in her mother and sister; Cancer in her mother; Diabetes in her mother; Heart attack in her mother; Hypertension in her mother; Pneumonia in her father; Stroke in her mother. Allergies  Allergen Reactions  . Bactrim Nausea And Vomiting  . Dilaudid [Hydromorphone Hcl]   . Hydrocodone Nausea And Vomiting  . Macrodantin Nausea And Vomiting      Review of Systems  Constitutional: Negative for fever, chills, appetite change and unexpected weight change.  Respiratory: Negative for shortness of breath.   Cardiovascular: Negative for chest pain.  Gastrointestinal: Positive for abdominal pain, diarrhea and constipation. Negative for nausea, vomiting, blood in stool and abdominal distention.  Genitourinary: Negative for dysuria.       Objective:   Physical Exam  Constitutional: She appears well-developed and well-nourished.  Neck: Neck supple. No thyromegaly present.  Cardiovascular: Normal rate and regular rhythm.   Pulmonary/Chest: Effort normal and breath sounds normal. No respiratory distress. She has no wheezes. She has no rales.  Abdominal: Soft. Bowel sounds are  normal. She exhibits no distension and no mass. There is no tenderness. There is no rebound and no guarding.  Musculoskeletal: She exhibits no edema.          Assessment & Plan:  Transient and intermittent/chronic abdominal pain. She is describing bilateral abdominal cramps as above which are very intermittent and dull chronically for several years. She does not have any red flags such  as appetite or weight changes or any fever or bloody stools. Question functional or motility disorder. Question IBS. She's not describing symptoms likely to suggest small bowel obstruction or diverticulitis. Obtain labs with CBC, chemistries, tissue transglutaminase. Consider trial of Bentyl for symptomatic relief  Type 2 diabetes. History of suboptimal control. Recheck A1c  Hypothyroidism. Recently over replaced back in January. We modified her dosage of levothyroxine. Recheck TSH

## 2013-12-20 LAB — TISSUE TRANSGLUTAMINASE, IGA: Tissue Transglutaminase Ab, IgA: 4.3 U/mL (ref <20)

## 2013-12-22 ENCOUNTER — Telehealth: Payer: Self-pay | Admitting: Family Medicine

## 2013-12-22 MED ORDER — GLIMEPIRIDE 2 MG PO TABS: 2.0000 mg | ORAL_TABLET | Freq: Every day | ORAL | Status: DC

## 2013-12-22 MED ORDER — LEVOTHYROXINE SODIUM 50 MCG PO TABS: 50.0000 ug | ORAL_TABLET | Freq: Every day | ORAL | Status: DC

## 2013-12-22 MED ORDER — METFORMIN HCL ER 500 MG PO TB24: 500.0000 mg | ORAL_TABLET | Freq: Every day | ORAL | Status: DC

## 2013-12-22 NOTE — Addendum Note (Signed)
Addended by: Townsend Roger D on: 12/22/2013 10:06 AM   Modules accepted: Orders, Medications

## 2013-12-22 NOTE — Telephone Encounter (Signed)
See result note.  

## 2013-12-22 NOTE — Telephone Encounter (Signed)
Pt would like to know if her thyroid med is going to be changed based on her lab results

## 2014-01-12 ENCOUNTER — Ambulatory Visit
Admission: RE | Admit: 2014-01-12 | Discharge: 2014-01-12 | Disposition: A | Discharge status: Home / Self Care | Payer: Medicare Other | Source: Ambulatory Visit | Attending: Adult Health | Admitting: Adult Health

## 2014-01-12 DIAGNOSIS — R921 Mammographic calcification found on diagnostic imaging of breast: Secondary | ICD-10-CM

## 2014-01-17 ENCOUNTER — Telehealth: Payer: Self-pay | Admitting: Family Medicine

## 2014-01-17 ENCOUNTER — Telehealth: Payer: Self-pay | Admitting: Cardiology

## 2014-01-17 DIAGNOSIS — Z79899 Other long term (current) drug therapy: Secondary | ICD-10-CM

## 2014-01-17 DIAGNOSIS — E7800 Pure hypercholesterolemia, unspecified: Secondary | ICD-10-CM

## 2014-01-17 NOTE — Telephone Encounter (Signed)
Labs were order for Lipid panel and LFT were placed in [facility] for  Friday 01/19/14. I spoke with Landscape architect at [employer] office, she states she will ask Dr. Royetta Crochet  to see if he okay for pt to came for labs  there . Seth Bake will call pt to let her know.  Pt asked if her husband  can have blood work there also. Pt states that [facility] Dr. Mare Ferrari nurse may have order the labs already. Pt is aware that the message will be send to nurse for reviewing.

## 2014-01-17 NOTE — Telephone Encounter (Signed)
New message     Talk to a nurse regarding recent labs at her pcp's office (he is in epic).  Will Dr Mare Ferrari want to do blood work also?

## 2014-01-17 NOTE — Telephone Encounter (Signed)
OK 

## 2014-01-17 NOTE — Telephone Encounter (Signed)
Pt has an appt with Dr. Mare Ferrari on Monday and would like to have labwork done at our facility due to distance.  Please advise if ok.

## 2014-01-17 NOTE — Telephone Encounter (Signed)
Yes, she will need a fasting lipid panel and a hepatic function panel

## 2014-01-17 NOTE — Telephone Encounter (Signed)
Pt called she would like to know when she can go for blood work to [facility]. Pt states had blood work in her PCP's office on 12/19/13. Her AIC was high and TSH was low, so she got medications for that. Pt states she know she needs to be NPO for the Lipid panel, so she would like to be call early Thursday morning to her cell phone, she would like to go the same day for labs at [facility] office. Pt has an appointment with Dr. Gardiner Coins this coming Monday 01/22/14 . She would like for MD to have the lab results then.

## 2014-01-18 NOTE — Telephone Encounter (Signed)
Pt aware, appt scheduled. °

## 2014-01-19 ENCOUNTER — Other Ambulatory Visit (INDEPENDENT_AMBULATORY_CARE_PROVIDER_SITE_OTHER): Payer: Medicare Other

## 2014-01-19 DIAGNOSIS — E7800 Pure hypercholesterolemia, unspecified: Secondary | ICD-10-CM

## 2014-01-19 DIAGNOSIS — E119 Type 2 diabetes mellitus without complications: Secondary | ICD-10-CM

## 2014-01-19 DIAGNOSIS — E039 Hypothyroidism, unspecified: Secondary | ICD-10-CM

## 2014-01-19 DIAGNOSIS — R300 Dysuria: Secondary | ICD-10-CM

## 2014-01-19 DIAGNOSIS — Z79899 Other long term (current) drug therapy: Secondary | ICD-10-CM

## 2014-01-19 LAB — LIPID PANEL
Cholesterol: 169 mg/dL (ref 0–200)
HDL: 46.4 mg/dL (ref >39.00)
LDL Cholesterol: 102 mg/dL — ABNORMAL HIGH (ref 0–99)
NonHDL: 122.6
Total CHOL/HDL Ratio: 4
Triglycerides: 103 mg/dL (ref 0.0–149.0)
VLDL: 20.6 mg/dL (ref 0.0–40.0)

## 2014-01-19 LAB — HEPATIC FUNCTION PANEL
ALT: 15 U/L (ref 0–35)
AST: 21 U/L (ref 0–37)
Albumin: 3.9 g/dL (ref 3.5–5.2)
Alkaline Phosphatase: 62 U/L (ref 39–117)
BILIRUBIN DIRECT: 0.1 mg/dL (ref 0.0–0.3)
BILIRUBIN TOTAL: 0.8 mg/dL (ref 0.2–1.2)
Total Protein: 6.7 g/dL (ref 6.0–8.3)

## 2014-01-19 LAB — TSH: TSH: 1.74 u[IU]/mL (ref 0.35–4.50)

## 2014-01-19 LAB — HEMOGLOBIN A1C: Hgb A1c MFr Bld: 8.2 % — ABNORMAL HIGH (ref 4.6–6.5)

## 2014-01-19 NOTE — Progress Notes (Signed)
Quick Note:  Please make copy of labs for patient visit. ______ 

## 2014-01-22 ENCOUNTER — Other Ambulatory Visit: Payer: Self-pay | Admitting: Family Medicine

## 2014-01-22 ENCOUNTER — Encounter: Payer: Self-pay | Admitting: Cardiology

## 2014-01-22 ENCOUNTER — Ambulatory Visit (INDEPENDENT_AMBULATORY_CARE_PROVIDER_SITE_OTHER): Payer: Medicare Other | Admitting: Cardiology

## 2014-01-22 VITALS — BP 114/74 | HR 69 | Ht 62.0 in | Wt 153.1 lb

## 2014-01-22 DIAGNOSIS — E1165 Type 2 diabetes mellitus with hyperglycemia: Secondary | ICD-10-CM

## 2014-01-22 DIAGNOSIS — E038 Other specified hypothyroidism: Secondary | ICD-10-CM

## 2014-01-22 DIAGNOSIS — R011 Cardiac murmur, unspecified: Secondary | ICD-10-CM | POA: Insufficient documentation

## 2014-01-22 DIAGNOSIS — K3189 Other diseases of stomach and duodenum: Secondary | ICD-10-CM

## 2014-01-22 DIAGNOSIS — R1013 Epigastric pain: Secondary | ICD-10-CM

## 2014-01-22 DIAGNOSIS — E7800 Pure hypercholesterolemia, unspecified: Secondary | ICD-10-CM

## 2014-01-22 DIAGNOSIS — E138 Other specified diabetes mellitus with unspecified complications: Secondary | ICD-10-CM

## 2014-01-22 DIAGNOSIS — IMO0001 Reserved for inherently not codable concepts without codable children: Secondary | ICD-10-CM

## 2014-01-22 NOTE — Patient Instructions (Signed)
Your physician has requested that you have an echocardiogram. Echocardiography is a painless test that uses sound waves to create images of your heart. It provides your doctor with information about the size and shape of your heart and how well your heart's chambers and valves are working. This procedure takes approximately one hour. There are no restrictions for this procedure.  Your physician recommends that you continue on your current medications as directed. Please refer to the Current Medication list given to you today.  Your physician wants you to follow-up in: 8 months with fasting labs (lp/bmet/hfp)  You will receive a reminder letter in the mail two months in advance. If you don't receive a letter, please call our office to schedule the follow-up appointment.

## 2014-01-22 NOTE — Assessment & Plan Note (Signed)
The patient has a systolic ejection murmur at the base.  We will obtain an echocardiogram to evaluate further.  This may be related to her symptoms of lack of energy and malaise and fatigue.

## 2014-01-22 NOTE — Progress Notes (Signed)
Julie Norton Date of Birth:  June 04, 1930 [place] [address] [place], [place]  [address] [phone]        Fax   [phone]   History of Present Illness: This pleasant 80 year old woman is seen for a scheduled followup office visit. She has a past history of hypercholesterolemia. She also has a history of left breast cancer. She finished radiation therapy in November 2012. Her surgeon is Dr. Margot Chimes. The patient elected not to go on tamoxifen. When we last saw her she was complaining of some right upper quadrant discomfort. However we went ahead and did an gallbladder ultrasound on 11/16/11 which was negative for cholelithiasis. Symptoms have improved.  Since last visit the patient has had problems with leg pain and has been evaluated by orthopedics.  She has been complaining of lack of energy.  She has had some shortness of breath and easy fatigue.  She does have hypothyroidism managed by Dr. Elease Hashimoto.   Current Outpatient Prescriptions  Medication Sig Dispense Refill  . aspirin 325 MG EC tablet Take 325 mg by mouth daily.      Marland Kitchen atorvastatin (LIPITOR) 10 MG tablet TAKE 1/2 TABLET EVERY OTHER DAY OR AS DIRECTED  90 tablet  1  . Calcium Carbonate-Vitamin D (CALCIUM 600+D) 600-200 MG-UNIT TABS Take 2 tablets by mouth daily.       . Cholecalciferol (VITAMIN D PO) Take by mouth.        . ezetimibe (ZETIA) 10 MG tablet Take 1 tablet (10 mg total) by mouth daily.  30 tablet  5  . glimepiride (AMARYL) 2 MG tablet Take 1 tablet (2 mg total) by mouth daily before breakfast.  90 tablet  1  . glucosamine-chondroitin 500-400 MG tablet Take 2 tablets by mouth daily.       Marland Kitchen glucose blood (ONE TOUCH ULTRA TEST) test strip Use as instructed  100 each  11  . levothyroxine (SYNTHROID, LEVOTHROID) 50 MCG tablet Take 75 mcg by mouth daily.      . metFORMIN (GLUCOPHAGE-XR) 500 MG 24 hr tablet Take 1 tablet (500 mg total) by mouth daily with breakfast.  90 tablet  1  .  omeprazole (PRILOSEC) 20 MG capsule Take 20 mg by mouth as needed.       . pramipexole (MIRAPEX) 0.125 MG tablet 1-2 at night as needed  60 tablet  5  . traMADol (ULTRAM) 50 MG tablet Take 50 mg by mouth as needed for pain.       No current facility-administered medications for this visit.    Allergies  Allergen Reactions  . Bactrim Nausea And Vomiting  . Dilaudid [Hydromorphone Hcl]   . Hydrocodone Nausea And Vomiting  . Macrodantin Nausea And Vomiting    Patient Active Problem List   Diagnosis Date Noted  . Hypercholesterolemia 11/28/2010    Priority: High  . Hypothyroidism 11/28/2010    Priority: Medium  . Heart murmur previously undiagnosed 01/22/2014  . Abdominal pain 11/11/2011  . Diabetes mellitus 11/11/2011  . DCIS, left, central, receptor + 01/12/2011  . Postmenopausal state 11/28/2010  . Osteoarthritis 11/28/2010  . Dyspepsia 11/28/2010    History  Smoking status  . Never Smoker   Smokeless tobacco  . Never Used    History  Alcohol Use No    Family History  Problem Relation Age of Onset  . Pneumonia Father   . Heart attack Mother   . Hypertension Mother   . Stroke Mother   .  Diabetes Mother   . Aortic aneurysm Mother     abdominal aortic aneurysm  . Cancer Mother     bladder  . Aortic aneurysm Sister     abdominal aortic aneurysm    Review of Systems: Constitutional: no fever chills diaphoresis or fatigue or change in weight.  Head and neck: no hearing loss, no epistaxis, no photophobia or visual disturbance. Respiratory: No cough, shortness of breath or wheezing. Cardiovascular: No chest pain peripheral edema, palpitations. Gastrointestinal: No abdominal distention, no abdominal pain, no change in bowel habits hematochezia or melena. Genitourinary: No dysuria, no frequency, no urgency, no nocturia. Musculoskeletal:No arthralgias, no back pain, no gait disturbance or myalgias. Neurological: No dizziness, no headaches, no numbness, no seizures,  no syncope, no weakness, no tremors. Hematologic: No lymphadenopathy, no easy bruising. Psychiatric: No confusion, no hallucinations, no sleep disturbance.    Physical Exam: Filed Vitals:   01/22/14 1439  BP: 114/74  Pulse: 69   the general appearance reveals a well-developed well-nourished woman in no distress.The head and neck exam reveals pupils equal and reactive.  Extraocular movements are full.  There is no scleral icterus.  The mouth and pharynx are normal.  The neck is supple.  The carotids reveal no bruits.  The jugular venous pressure is normal.  The  thyroid is not enlarged.  There is no lymphadenopathy.  The chest is clear to percussion and auscultation.  There are no rales or rhonchi.  Expansion of the chest is symmetrical.  The precordium is quiet.  The first heart sound is normal.  The second heart sound is physiologically split.  There is grade 2/6 systolic ejection murmur at the base.  No diastolic murmur.  There is no abnormal lift or heave.  The abdomen is soft and nontender.  The bowel sounds are normal.  The liver and spleen are not enlarged.  There are no abdominal masses.  There are no abdominal bruits.  Extremities reveal good pedal pulses.  There is no phlebitis or edema.  There is no cyanosis or clubbing.  Strength is normal and symmetrical in all extremities.  There is no lateralizing weakness.  There are no sensory deficits.  The skin is warm and dry.  There is no rash.   EKG shows normal sinus rhythm with first degree AV block and nonspecific ST changes and is unchanged since 12/01/12  Assessment / Plan: 1. heart murmur, previously undiagnosed.  Possible aortic stenosis.  We will get echocardiogram. 2. Hypercholesterolemia 3. past history of breast cancer of left breast treated with lumpectomy and radiation therapy 4. diabetes mellitus 5. malaise and fatigue.  Problems with insomnia.Marland Kitchen she will discuss with her PCP.  Consider obstructive sleep apnea.  Her husband  states that she snores occasionally but not consistently.  Disposition continue same medication.  Return for echocardiogram.  Recheck in 8 months for office visit lipid panel hepatic function panel and basal metabolic panel.

## 2014-01-22 NOTE — Assessment & Plan Note (Signed)
Her dyspepsia has remained stable on current therapy

## 2014-01-22 NOTE — Assessment & Plan Note (Addendum)
The patient has a history of hypercholesterolemia.  We reviewed her lab work.  It is satisfactory.  She takes a small dose of Lipitor on alternate day and also takes ezetimibe but does not take either one on a consistent basis.  The ezetimibe has become much more expensive for her since last visit.

## 2014-01-23 LAB — URINE CULTURE: Colony Count: >=100000

## 2014-01-24 ENCOUNTER — Other Ambulatory Visit: Payer: Self-pay

## 2014-01-24 MED ORDER — CEPHALEXIN 500 MG PO CAPS: 500.0000 mg | ORAL_CAPSULE | Freq: Three times a day (TID) | ORAL | Status: DC

## 2014-02-01 ENCOUNTER — Ambulatory Visit (HOSPITAL_COMMUNITY): Payer: Medicare Other | Attending: Cardiology | Admitting: Radiology

## 2014-02-01 DIAGNOSIS — R011 Cardiac murmur, unspecified: Secondary | ICD-10-CM | POA: Insufficient documentation

## 2014-02-01 NOTE — Progress Notes (Signed)
Echocardiogram performed.  

## 2014-02-15 ENCOUNTER — Other Ambulatory Visit: Payer: Self-pay | Admitting: Cardiology

## 2014-02-16 NOTE — Telephone Encounter (Signed)
Is this ok to refill? If so, should it be bid as requested by the pharmacy, or prn like is on the chart? Please advise. Thanks, MI

## 2014-02-24 ENCOUNTER — Other Ambulatory Visit: Payer: Self-pay | Admitting: Family Medicine

## 2014-03-07 ENCOUNTER — Encounter: Payer: Self-pay | Admitting: Family Medicine

## 2014-03-07 ENCOUNTER — Ambulatory Visit (INDEPENDENT_AMBULATORY_CARE_PROVIDER_SITE_OTHER): Payer: Medicare Other | Admitting: Family Medicine

## 2014-03-07 VITALS — BP 124/68 | HR 79 | Temp 98.0°F | Wt 154.0 lb

## 2014-03-07 DIAGNOSIS — R300 Dysuria: Secondary | ICD-10-CM

## 2014-03-07 LAB — POCT URINALYSIS DIPSTICK
BILIRUBIN UA: NEGATIVE
GLUCOSE UA: NEGATIVE
Ketones, UA: NEGATIVE
LEUKOCYTES UA: NEGATIVE
NITRITE UA: NEGATIVE
Protein, UA: NEGATIVE
RBC UA: NEGATIVE
Spec Grav, UA: 1.015
UROBILINOGEN UA: 2
pH, UA: 6

## 2014-03-07 MED ORDER — CIPROFLOXACIN HCL 500 MG PO TABS
500.0000 mg | ORAL_TABLET | Freq: Two times a day (BID) | ORAL | Status: DC
Start: 2014-03-07 — End: 2014-04-03

## 2014-03-07 NOTE — Progress Notes (Signed)
   Subjective:    Patient ID: Julie Norton, female    DOB: Dec 24, 1929, 80 y.o.   MRN: [id number]  Back Pain Associated symptoms include dysuria. Pertinent negatives include no fever.   Patient complains of some vague right lumbar back pain and also some pressure with urinating and slight frequency past several days. She had some mild nausea earlier but no vomiting. Very similar symptoms with UTI the past. No gross hematuria.  Type 2 diabetes. We switched her from regular metformin to metformin extended release and added low-dose glyburide. Her abdominal cramping symptoms have essentially resolved with change of metformin. Her blood sugars have improved in she's had occasional hypoglycemic episode. She is to follow up in October or November for diabetes recheck.  Past Medical History  Diagnosis Date  . H/O: hysterectomy 1978  . Hypercholesteremia   . Hypothyroidism   . Dyspepsia   . Chest pain     intermittent right upper quadrant discomfort radiating around her back      . Tendinitis     of the right arm and shoulder  . Abdominal gas pain     suspected -- may be related to intermittent right upper quadrant discomfort radiating around her back      . Breast mass     left breast  . GERD (gastroesophageal reflux disease)   . Cancer     left breast   Past Surgical History  Procedure Laterality Date  . Tonsillectomy and adenoidectomy  1950    at 80 years of age  . Breast reduction surgery    . Bladder repair    . Lipoma excision    . Appendectomy  1978  . Breast lumpectomy  02/24/2011    central partial mastectomy - dr Margot Chimes  . Abdominal hysterectomy      reports that she has never smoked. She has never used smokeless tobacco. She reports that she does not drink alcohol or use illicit drugs. family history includes Aortic aneurysm in her mother and sister; Cancer in her mother; Diabetes in her mother; Heart attack in her mother; Hypertension in her mother; Pneumonia in her  father; Stroke in her mother. Allergies  Allergen Reactions  . Bactrim Nausea And Vomiting  . Dilaudid [Hydromorphone Hcl]   . Hydrocodone Nausea And Vomiting  . Macrodantin Nausea And Vomiting      Review of Systems  Constitutional: Negative for fever and chills.  Genitourinary: Positive for dysuria and frequency. Negative for hematuria and difficulty urinating.  Musculoskeletal: Positive for back pain.       Objective:   Physical Exam  Constitutional: She appears well-developed and well-nourished. No distress.  Cardiovascular: Normal rate and regular rhythm.   Pulmonary/Chest: Effort normal and breath sounds normal. No respiratory distress. She has no wheezes. She has no rales.          Assessment & Plan:  Dysuria. Urine dipstick is basically normal which would not suggest likely UTI. However, in the past, she's had a couple episodes of relatively normal urinalysis with dipstick but positive culture. Urine culture sent. Cipro 500 mg twice a day pending culture results- if she has any fever or worsening symptoms. She'll followup in 2 months regarding her diabetes

## 2014-03-07 NOTE — Progress Notes (Signed)
Pre visit review using our clinic review tool, if applicable. No additional management support is needed unless otherwise documented below in the visit note. 

## 2014-03-10 LAB — URINE CULTURE

## 2014-03-15 ENCOUNTER — Telehealth: Payer: Self-pay | Admitting: Hematology and Oncology

## 2014-03-19 ENCOUNTER — Ambulatory Visit: Payer: Medicare Other | Admitting: Oncology

## 2014-03-19 ENCOUNTER — Other Ambulatory Visit: Payer: Medicare Other

## 2014-04-02 ENCOUNTER — Other Ambulatory Visit: Payer: Self-pay

## 2014-04-02 DIAGNOSIS — D0510 Intraductal carcinoma in situ of unspecified breast: Secondary | ICD-10-CM

## 2014-04-03 ENCOUNTER — Encounter: Payer: Self-pay | Admitting: Hematology and Oncology

## 2014-04-03 ENCOUNTER — Ambulatory Visit (HOSPITAL_BASED_OUTPATIENT_CLINIC_OR_DEPARTMENT_OTHER): Payer: Medicare Other | Admitting: Hematology and Oncology

## 2014-04-03 ENCOUNTER — Other Ambulatory Visit (HOSPITAL_BASED_OUTPATIENT_CLINIC_OR_DEPARTMENT_OTHER): Payer: Medicare Other

## 2014-04-03 ENCOUNTER — Telehealth: Payer: Self-pay | Admitting: Hematology and Oncology

## 2014-04-03 VITALS — BP 154/81 | HR 74 | Temp 98.0°F | Resp 18 | Ht 62.0 in | Wt 155.2 lb

## 2014-04-03 DIAGNOSIS — Z853 Personal history of malignant neoplasm of breast: Secondary | ICD-10-CM

## 2014-04-03 DIAGNOSIS — D0590 Unspecified type of carcinoma in situ of unspecified breast: Secondary | ICD-10-CM

## 2014-04-03 DIAGNOSIS — D0510 Intraductal carcinoma in situ of unspecified breast: Secondary | ICD-10-CM

## 2014-04-03 DIAGNOSIS — D0512 Intraductal carcinoma in situ of left breast: Secondary | ICD-10-CM

## 2014-04-03 LAB — CBC WITH DIFFERENTIAL/PLATELET
BASO%: 0.2 % (ref 0.0–2.0)
BASOS ABS: 0 10*3/uL (ref 0.0–0.1)
EOS%: 3 % (ref 0.0–7.0)
Eosinophils Absolute: 0.2 10*3/uL (ref 0.0–0.5)
HEMATOCRIT: 37.4 % (ref 34.8–46.6)
HGB: 12.8 g/dL (ref 11.6–15.9)
LYMPH%: 28.5 % (ref 14.0–49.7)
MCH: 29.2 pg (ref 25.1–34.0)
MCHC: 34.2 g/dL (ref 31.5–36.0)
MCV: 85.2 fL (ref 79.5–101.0)
MONO#: 0.6 10*3/uL (ref 0.1–0.9)
MONO%: 10.1 % (ref 0.0–14.0)
NEUT#: 3.6 10*3/uL (ref 1.5–6.5)
NEUT%: 58.2 % (ref 38.4–76.8)
Platelets: 141 10*3/uL — ABNORMAL LOW (ref 145–400)
RBC: 4.39 10*6/uL (ref 3.70–5.45)
RDW: 13.2 % (ref 11.2–14.5)
WBC: 6.2 10*3/uL (ref 3.9–10.3)
lymph#: 1.8 10*3/uL (ref 0.9–3.3)

## 2014-04-03 LAB — COMPREHENSIVE METABOLIC PANEL (CC13)
ALK PHOS: 75 U/L (ref 40–150)
ALT: 15 U/L (ref 0–55)
AST: 20 U/L (ref 5–34)
Albumin: 3.7 g/dL (ref 3.5–5.0)
Anion Gap: 5 mEq/L (ref 3–11)
BILIRUBIN TOTAL: 0.76 mg/dL (ref 0.20–1.20)
BUN: 11.8 mg/dL (ref 7.0–26.0)
CALCIUM: 9.7 mg/dL (ref 8.4–10.4)
CO2: 27 mEq/L (ref 22–29)
Chloride: 111 mEq/L — ABNORMAL HIGH (ref 98–109)
Creatinine: 0.8 mg/dL (ref 0.6–1.1)
Glucose: 148 mg/dl — ABNORMAL HIGH (ref 70–140)
Potassium: 4.1 mEq/L (ref 3.5–5.1)
Sodium: 142 mEq/L (ref 136–145)
Total Protein: 6.7 g/dL (ref 6.4–8.3)

## 2014-04-03 NOTE — Telephone Encounter (Signed)
per pof to sch pt appt-gave pt copy of sch °

## 2014-04-03 NOTE — Progress Notes (Signed)
Patient Care Team: Eulas Post, MD as PCP - General (Family Medicine) Haywood Lasso, MD as Surgeon (General Surgery) Maisie Fus, MD (Obstetrics and Gynecology)  DIAGNOSIS: ductal carcinoma in situ of the left breast status post central lumpectomy on 02/24/2011.   PRIOR THERAPY:  #1 patient underwent a central lumpectomy with removal of the left nipple area LOC complex on 02/24/2011. The final pathology revealed intermediate to high-grade ductal carcinoma in situ. Tumor was ER +99% PR +100%.  #2 patient then received radiation therapy from 04/14/2011 2 05/08/2011  #3 patient declined antiestrogen therapy.  CURRENT THERAPY:Observation  CHIEF COMPLIANT: Followup of DCIS  INTERVAL HISTORY: Julie Norton is a 80 year old Caucasian with above-mentioned history of DCIS of the left breast treated with lumpectomy and radiation followed by observation. She was offered antiestrogen therapy but she declined. She reports that over the past several months she's noticed mild epigastric discomfort feels like a chest pressure and she is also having fatigue issues in the morning. She had seen a primary care physician who obtained an extensive workup including EKG and echocardiogram. She reports and all of those tests came back normal. She does not have any chest pain to exertion. She denies any diaphoresis. She denies any new lumps or nodules in the breast  REVIEW OF SYSTEMS:   Constitutional: Denies fevers, chills or abnormal weight loss Eyes: Denies blurriness of vision Ears, nose, mouth, throat, and face: Denies mucositis or sore throat Respiratory: Denies cough, dyspnea or wheezes Cardiovascular: Denies palpitation, chest discomfort or lower extremity swelling Gastrointestinal:  Acid reflux Skin: Denies abnormal skin rashes Lymphatics: Denies new lymphadenopathy or easy bruising Neurological:Denies numbness, tingling or new weaknesses Behavioral/Psych: Mood is stable, no new changes   Breast:  denies any pain or lumps or nodules in either breasts All other systems were reviewed with the patient and are negative.  I have reviewed the past medical history, past surgical history, social history and family history with the patient and they are unchanged from previous note.  ALLERGIES:  is allergic to bactrim; dilaudid; hydrocodone; and macrodantin.  MEDICATIONS:  Current Outpatient Prescriptions  Medication Sig Dispense Refill  . aspirin 325 MG EC tablet Take 325 mg by mouth daily.      Marland Kitchen atorvastatin (LIPITOR) 10 MG tablet TAKE 1/2 TABLET EVERY OTHER DAY OR AS DIRECTED  90 tablet  1  . Calcium Carbonate-Vitamin D (CALCIUM 600+D) 600-200 MG-UNIT TABS Take 2 tablets by mouth daily.       . Cholecalciferol (VITAMIN D PO) Take by mouth.        . ezetimibe (ZETIA) 10 MG tablet Take 1 tablet (10 mg total) by mouth daily.  30 tablet  5  . glimepiride (AMARYL) 2 MG tablet Take 1 tablet (2 mg total) by mouth daily before breakfast.  90 tablet  1  . glucosamine-chondroitin 500-400 MG tablet Take 2 tablets by mouth daily.       Marland Kitchen glucose blood (ONE TOUCH ULTRA TEST) test strip Use as instructed  100 each  11  . levothyroxine (SYNTHROID, LEVOTHROID) 50 MCG tablet Take 75 mcg by mouth daily.      . metFORMIN (GLUCOPHAGE-XR) 500 MG 24 hr tablet Take 1 tablet (500 mg total) by mouth daily with breakfast.  90 tablet  1  . omeprazole (PRILOSEC) 20 MG capsule TAKE 1 CAPSULE TWICE DAILY  60 capsule  3  . ONE TOUCH ULTRA TEST test strip CHECK ONCE A DAY  50 each  3  .  traMADol (ULTRAM) 50 MG tablet Take 50 mg by mouth as needed for pain.       No current facility-administered medications for this visit.    PHYSICAL EXAMINATION: ECOG PERFORMANCE STATUS: 1 - Symptomatic but completely ambulatory  Filed Vitals:   04/03/14 1103  BP: 154/81  Pulse: 74  Temp: 98 F (36.7 C)  Resp: 18   Filed Weights   04/03/14 1103  Weight: 155 lb 3.2 oz (70.398 kg)    GENERAL:alert, no distress  and comfortable SKIN: skin color, texture, turgor are normal, no rashes or significant lesions EYES: normal, Conjunctiva are pink and non-injected, sclera clear OROPHARYNX:no exudate, no erythema and lips, buccal mucosa, and tongue normal  NECK: supple, thyroid normal size, non-tender, without nodularity LYMPH:  no palpable lymphadenopathy in the cervical, axillary or inguinal LUNGS: clear to auscultation and percussion with normal breathing effort HEART: regular rate & rhythm and no murmurs and no lower extremity edema ABDOMEN:abdomen soft, non-tender and normal bowel sounds Musculoskeletal:no cyanosis of digits and no clubbing  NEURO: alert & oriented x 3 with fluent speech, no focal motor/sensory deficits BREAST: No palpable masses or nodules in either right or left breasts. No palpable axillary supraclavicular or infraclavicular adenopathy no breast tenderness or nipple discharge.   LABORATORY DATA:  I have reviewed the data as listed   Chemistry      Component Value Date/Time   NA 142 04/03/2014 1026   NA 139 12/19/2013 1248   K 4.1 04/03/2014 1026   K 3.9 12/19/2013 1248   CL 106 12/19/2013 1248   CO2 27 04/03/2014 1026   CO2 26 12/19/2013 1248   BUN 11.8 04/03/2014 1026   BUN 12 12/19/2013 1248   CREATININE 0.8 04/03/2014 1026   CREATININE 0.6 12/19/2013 1248   CREATININE 0.68 11/27/2011 1856      Component Value Date/Time   CALCIUM 9.7 04/03/2014 1026   CALCIUM 10.0 12/19/2013 1248   ALKPHOS 75 04/03/2014 1026   ALKPHOS 62 01/19/2014 0836   AST 20 04/03/2014 1026   AST 21 01/19/2014 0836   ALT 15 04/03/2014 1026   ALT 15 01/19/2014 0836   BILITOT 0.76 04/03/2014 1026   BILITOT 0.8 01/19/2014 0836       Lab Results  Component Value Date   WBC 6.2 04/03/2014   HGB 12.8 04/03/2014   HCT 37.4 04/03/2014   MCV 85.2 04/03/2014   PLT 141* 04/03/2014   NEUTROABS 3.6 04/03/2014     RADIOGRAPHIC STUDIES: I have personally reviewed the radiology reports and agreed with their findings. No  results found.   ASSESSMENT & PLAN:  DCIS, left, central, receptor + 1. High grade DCIS on February 02, 2011. She underwent left central partial mastectomy on February 24, 2011. She had intermediate to high grade DCIS with negative surgical margins. ER was positive at 99%, PR positive at 100%. Patient refused tamoxifen therapy. She is currently on surveillance. Mammograms in June 2015 the reviewed. Today's breast exam did not reveal any lumps or nodules. I recommended that we continue to watch and monitor her once a year with physical exams in the breast.  2. Intermittent epigastric discomfort/ tightness: She had cardiac workup including EKGs and echocardiograms which did not reveal any abnormalities with her primary care physician. I suspect it may be related to acid reflux versus radiation effect on the skin and subcutaneous tissues causing some tightening in that area. I discussed with her that if she develops left-sided chest pain radiating down the left  arm or if she gets diaphoretic she needs to go to the emergency room.    No orders of the defined types were placed in this encounter.   The patient has a good understanding of the overall plan. she agrees with it. She will call with any problems that may develop before her next visit here.  I spent 20 minutes counseling the patient face to face. The total time spent in the appointment was 25 minutes and more than 50% was on counseling and review of test results    Rulon Eisenmenger, MD 04/03/2014 11:25 AM

## 2014-04-03 NOTE — Assessment & Plan Note (Signed)
1. High grade DCIS on February 02, 2011. She underwent left central partial mastectomy on February 24, 2011. She had intermediate to high grade DCIS with negative surgical margins. ER was positive at 99%, PR positive at 100%. Patient refused tamoxifen therapy. She is currently on surveillance. Mammograms in June 2015 the reviewed. Today's breast exam did not reveal any lumps or nodules. I recommended that we continue to watch and monitor her once a year with physical exams in the breast.  2. Intermittent epigastric discomfort/ tightness: She had cardiac workup including EKGs and echocardiograms which did not reveal any abnormalities with her primary care physician. I suspect it may be related to acid reflux versus radiation effect on the skin and subcutaneous tissues causing some tightening in that area. I discussed with her that if she develops left-sided chest pain radiating down the left arm or if she gets diaphoretic she needs to go to the emergency room.

## 2014-04-05 NOTE — Addendum Note (Signed)
Addended by: Prentiss Bells on: 04/05/2014 02:01 PM   Modules accepted: Orders

## 2014-04-06 ENCOUNTER — Encounter: Payer: Self-pay | Admitting: Cardiology

## 2014-04-06 NOTE — Telephone Encounter (Signed)
New problem    Pt want you to call to about referring her to an audiologist. Please call pt.

## 2014-04-06 NOTE — Telephone Encounter (Signed)
This encounter was created in error - please disregard.

## 2014-04-20 ENCOUNTER — Other Ambulatory Visit: Payer: Self-pay

## 2014-04-22 ENCOUNTER — Other Ambulatory Visit: Payer: Self-pay | Admitting: Family Medicine

## 2014-04-24 ENCOUNTER — Telehealth: Payer: Self-pay | Admitting: Cardiology

## 2014-04-24 NOTE — Telephone Encounter (Signed)
New message      Pt has pain in armpit and down her rt arm.  It started 1wk ago.  Last night it hurt for 1-2hrs.  Last night the pain went all the way down to her elbow---it had been only hurting in her armpit area.  Pt is a diabetic.  No other symptoms.

## 2014-04-24 NOTE — Telephone Encounter (Signed)
Agree with advice given

## 2014-04-24 NOTE — Telephone Encounter (Signed)
Called stating she called the nurse line from insurance this AM because she had severe pain under (R) arm pit last night radiating to elbow.  States she had the pain about 1 week ago and again during the day last week but pain didn't radiate to elbow.  Pain lasted only few seconds until last pm which lasted about an hour off and on.  States she took an Aleve and Tramadol and pain was relieved.  Today she states that her arm feels a little weak but is able to move without problem. No weakness.  Denies SOB or CP. No pain at this time. States she has not lifted anything heavy that she can remember. She did reach for something in new cabinets about a week ago but didn't lift anything heavy.  Advised to call Dr. Erick Blinks office to see if they feel like could be muscle strain. She feels comfortable with calling Dr. Erick Blinks office.  Advised will forward to Dr. Mare Ferrari for any recommendations.

## 2014-05-07 ENCOUNTER — Other Ambulatory Visit (INDEPENDENT_AMBULATORY_CARE_PROVIDER_SITE_OTHER): Payer: Medicare Other

## 2014-05-07 ENCOUNTER — Encounter: Payer: Self-pay | Admitting: Family Medicine

## 2014-05-07 ENCOUNTER — Ambulatory Visit: Payer: Medicare Other | Admitting: Family Medicine

## 2014-05-07 ENCOUNTER — Ambulatory Visit (INDEPENDENT_AMBULATORY_CARE_PROVIDER_SITE_OTHER): Payer: Medicare Other

## 2014-05-07 ENCOUNTER — Ambulatory Visit (INDEPENDENT_AMBULATORY_CARE_PROVIDER_SITE_OTHER): Payer: Medicare Other | Admitting: Family Medicine

## 2014-05-07 DIAGNOSIS — R300 Dysuria: Secondary | ICD-10-CM

## 2014-05-07 DIAGNOSIS — Z23 Encounter for immunization: Secondary | ICD-10-CM

## 2014-05-07 LAB — POCT URINALYSIS DIPSTICK
BILIRUBIN UA: NEGATIVE
GLUCOSE UA: NEGATIVE
KETONES UA: NEGATIVE
Nitrite, UA: NEGATIVE
PROTEIN UA: NEGATIVE
SPEC GRAV UA: 1.01
Urobilinogen, UA: 1
pH, UA: 7.5

## 2014-05-07 MED ORDER — CEPHALEXIN 500 MG PO CAPS: 500.0000 mg | ORAL_CAPSULE | Freq: Three times a day (TID) | ORAL | Status: DC

## 2014-05-07 NOTE — Patient Instructions (Signed)

## 2014-05-07 NOTE — Progress Notes (Signed)
   Subjective:    Patient ID: Julie Norton, female    DOB: [birth year], 80 y.o.   MRN: [id number]  HPI   Patient seen as a work in with couple day history of urine frequency and burning. She grew out Proteus species but low colony count last month. She's not had any fever or chills. No flank pain. No nausea or vomiting.  Past Medical History  Diagnosis Date  . H/O: hysterectomy 1978  . Hypercholesteremia   . Hypothyroidism   . Dyspepsia   . Chest pain     intermittent right upper quadrant discomfort radiating around her back      . Tendinitis     of the right arm and shoulder  . Abdominal gas pain     suspected -- may be related to intermittent right upper quadrant discomfort radiating around her back      . Breast mass     left breast  . GERD (gastroesophageal reflux disease)   . Cancer     left breast   Past Surgical History  Procedure Laterality Date  . Tonsillectomy and adenoidectomy  1950    at 80 years of age  . Breast reduction surgery    . Bladder repair    . Lipoma excision    . Appendectomy  1978  . Breast lumpectomy  02/24/2011    central partial mastectomy - dr Margot Chimes  . Abdominal hysterectomy      reports that she has never smoked. She has never used smokeless tobacco. She reports that she does not drink alcohol or use illicit drugs. family history includes Aortic aneurysm in her mother and sister; Cancer in her mother; Diabetes in her mother; Heart attack in her mother; Hypertension in her mother; Pneumonia in her father; Stroke in her mother. Allergies  Allergen Reactions  . Bactrim Nausea And Vomiting  . Dilaudid [Hydromorphone Hcl]   . Hydrocodone Nausea And Vomiting  . Macrodantin Nausea And Vomiting      Review of Systems  Constitutional: Negative for fever, chills and appetite change.  Gastrointestinal: Negative for nausea, vomiting, abdominal pain, diarrhea and constipation.  Genitourinary: Positive for dysuria and frequency.    Musculoskeletal: Negative for back pain.  Neurological: Negative for dizziness.       Objective:   Physical Exam  Constitutional: She appears well-developed and well-nourished.  Cardiovascular: Normal rate and regular rhythm.   Pulmonary/Chest: Effort normal and breath sounds normal. No respiratory distress. She has no wheezes. She has no rales.          Assessment & Plan:  Dysuria. Probable uncomplicated cystitis. Urine culture sent. Cover with Keflex 500 mg 3 times a day for 7 days pending culture results.

## 2014-05-10 LAB — URINE CULTURE

## 2014-06-19 LAB — HM DIABETES EYE EXAM

## 2014-06-20 ENCOUNTER — Other Ambulatory Visit: Payer: Self-pay | Admitting: Family Medicine

## 2014-06-24 ENCOUNTER — Other Ambulatory Visit: Payer: Self-pay | Admitting: Family Medicine

## 2014-06-27 ENCOUNTER — Encounter: Payer: Self-pay | Admitting: Family Medicine

## 2014-08-17 ENCOUNTER — Telehealth: Payer: Self-pay | Admitting: Cardiology

## 2014-08-17 DIAGNOSIS — IMO0002 Reserved for concepts with insufficient information to code with codable children: Secondary | ICD-10-CM

## 2014-08-17 DIAGNOSIS — Z79899 Other long term (current) drug therapy: Secondary | ICD-10-CM

## 2014-08-17 DIAGNOSIS — E1165 Type 2 diabetes mellitus with hyperglycemia: Secondary | ICD-10-CM

## 2014-08-17 NOTE — Telephone Encounter (Signed)
Advised that Dr. Mare Ferrari and Rip Harbour are not in the office today, but that they will address this next week when they return. She is fine with this.

## 2014-08-17 NOTE — Telephone Encounter (Signed)
New message   Pt called request a1c . Please put in orders.. Would also like to have blood work for thyroid. Please call back to discuss

## 2014-08-17 NOTE — Telephone Encounter (Signed)
Agree with checking thyroid and A1C when she comes for lab work.

## 2014-08-20 ENCOUNTER — Encounter: Payer: Self-pay | Admitting: Family Medicine

## 2014-08-20 NOTE — Telephone Encounter (Signed)
Lab orders in Epic.

## 2014-08-20 NOTE — Telephone Encounter (Signed)
Patient aware.

## 2014-08-21 ENCOUNTER — Other Ambulatory Visit (INDEPENDENT_AMBULATORY_CARE_PROVIDER_SITE_OTHER): Payer: Medicare Other

## 2014-08-21 DIAGNOSIS — E138 Other specified diabetes mellitus with unspecified complications: Secondary | ICD-10-CM

## 2014-08-21 DIAGNOSIS — E7800 Pure hypercholesterolemia, unspecified: Secondary | ICD-10-CM

## 2014-08-21 DIAGNOSIS — Z79899 Other long term (current) drug therapy: Secondary | ICD-10-CM

## 2014-08-21 DIAGNOSIS — E1165 Type 2 diabetes mellitus with hyperglycemia: Secondary | ICD-10-CM

## 2014-08-21 DIAGNOSIS — IMO0002 Reserved for concepts with insufficient information to code with codable children: Secondary | ICD-10-CM

## 2014-08-21 LAB — BASIC METABOLIC PANEL
BUN: 16 mg/dL (ref 6–23)
CALCIUM: 9.6 mg/dL (ref 8.4–10.5)
CO2: 28 mEq/L (ref 19–32)
CREATININE: 0.78 mg/dL (ref 0.40–1.20)
Chloride: 107 mEq/L (ref 96–112)
GFR: 75.08 mL/min (ref >60.00)
Glucose, Bld: 182 mg/dL — ABNORMAL HIGH (ref 70–99)
Potassium: 4.8 mEq/L (ref 3.5–5.1)
Sodium: 140 mEq/L (ref 135–145)

## 2014-08-21 LAB — LIPID PANEL
CHOL/HDL RATIO: 4
Cholesterol: 176 mg/dL (ref 0–200)
HDL: 47.5 mg/dL (ref >39.00)
LDL Cholesterol: 93 mg/dL (ref 0–99)
NONHDL: 128.5
Triglycerides: 176 mg/dL — ABNORMAL HIGH (ref 0.0–149.0)
VLDL: 35.2 mg/dL (ref 0.0–40.0)

## 2014-08-21 LAB — HEPATIC FUNCTION PANEL
ALT: 14 U/L (ref 0–35)
AST: 17 U/L (ref 0–37)
Albumin: 4 g/dL (ref 3.5–5.2)
Alkaline Phosphatase: 69 U/L (ref 39–117)
BILIRUBIN DIRECT: 0.2 mg/dL (ref 0.0–0.3)
Total Bilirubin: 0.7 mg/dL (ref 0.2–1.2)
Total Protein: 6.9 g/dL (ref 6.0–8.3)

## 2014-08-21 LAB — HEMOGLOBIN A1C: HEMOGLOBIN A1C: 8.5 % — AB (ref 4.6–6.5)

## 2014-08-21 LAB — T4, FREE: FREE T4: 0.92 ng/dL (ref 0.60–1.60)

## 2014-08-21 LAB — TSH: TSH: 2.07 u[IU]/mL (ref 0.35–4.50)

## 2014-08-21 NOTE — Progress Notes (Signed)
Quick Note:  Please make copy of labs for patient visit. ______ 

## 2014-08-23 ENCOUNTER — Encounter: Payer: Self-pay | Admitting: Cardiology

## 2014-08-23 ENCOUNTER — Ambulatory Visit (INDEPENDENT_AMBULATORY_CARE_PROVIDER_SITE_OTHER): Payer: Medicare Other | Admitting: Cardiology

## 2014-08-23 ENCOUNTER — Telehealth: Payer: Self-pay

## 2014-08-23 VITALS — BP 140/84 | HR 72 | Ht 62.0 in | Wt 155.0 lb

## 2014-08-23 DIAGNOSIS — E1165 Type 2 diabetes mellitus with hyperglycemia: Secondary | ICD-10-CM

## 2014-08-23 DIAGNOSIS — E7800 Pure hypercholesterolemia, unspecified: Secondary | ICD-10-CM

## 2014-08-23 DIAGNOSIS — E039 Hypothyroidism, unspecified: Secondary | ICD-10-CM

## 2014-08-23 DIAGNOSIS — G629 Polyneuropathy, unspecified: Secondary | ICD-10-CM

## 2014-08-23 DIAGNOSIS — IMO0002 Reserved for concepts with insufficient information to code with codable children: Secondary | ICD-10-CM

## 2014-08-23 MED ORDER — GABAPENTIN 300 MG PO CAPS: 300.0000 mg | ORAL_CAPSULE | Freq: Every day | ORAL | Status: DC

## 2014-08-23 NOTE — Patient Instructions (Signed)
START GABAPENTIN 300 MG ONE AT BEDTIME FOR LEG PAIN   Your physician wants you to follow-up in: 6 months with fasting labs (lp/bmet/hfp/A1C)  You will receive a reminder letter in the mail two months in advance. If you don't receive a letter, please call our office to schedule the follow-up appointment.

## 2014-08-23 NOTE — Telephone Encounter (Signed)
Opened by mistake.

## 2014-08-23 NOTE — Progress Notes (Signed)
Cardiology Office Note   Date:  08/23/2014   ID:  Julie Norton, DOB 10-10-1929, MRN [id number]  PCP:  Julie Post, MD  Cardiologist:    Julie Coco, MD   No chief complaint on file.     History of Present Illness: Julie Norton is a 81 y.o. female who presents for scheduled follow-up office visit  This pleasant 81 year old woman is seen for a scheduled followup office visit. She has a past history of hypercholesterolemia. She also has a history of left breast cancer. She finished radiation therapy in November 2012. Her surgeon is Dr. Margot Norton. The patient elected not to go on tamoxifen. When we last saw her she was complaining of some right upper quadrant discomfort. However we went ahead and did an gallbladder ultrasound on 11/16/11 which was negative for cholelithiasis. Symptoms have improved. Since last visit the patient has had problems with leg pain and has been evaluated by orthopedics. She has been complaining of lack of energy. She has had some shortness of breath and easy fatigue.  She has a history of diabetes. She does have hypothyroidism managed by Dr. Elease Norton.  Past Medical History  Diagnosis Date  . H/O: hysterectomy 1978  . Hypercholesteremia   . Hypothyroidism   . Dyspepsia   . Chest pain     intermittent right upper quadrant discomfort radiating around her back      . Tendinitis     of the right arm and shoulder  . Abdominal gas pain     suspected -- may be related to intermittent right upper quadrant discomfort radiating around her back      . Breast mass     left breast  . GERD (gastroesophageal reflux disease)   . Cancer     left breast    Past Surgical History  Procedure Laterality Date  . Tonsillectomy and adenoidectomy  1950    at 81 years of age  . Breast reduction surgery    . Bladder repair    . Lipoma excision    . Appendectomy  1978  . Breast lumpectomy  02/24/2011    central partial mastectomy - dr Julie Norton  .  Abdominal hysterectomy       Current Outpatient Prescriptions  Medication Sig Dispense Refill  . aspirin 325 MG EC tablet Take 325 mg by mouth daily.    Marland Kitchen atorvastatin (LIPITOR) 10 MG tablet TAKE 1/2 TABLET EVERY OTHER DAY OR AS DIRECTED 90 tablet 1  . Calcium Carbonate-Vitamin D (CALCIUM 600+D) 600-200 MG-UNIT TABS Take 2 tablets by mouth daily.     . Cholecalciferol (VITAMIN D PO) Take by mouth daily.     Marland Kitchen ezetimibe (ZETIA) 10 MG tablet Take 1 tablet (10 mg total) by mouth daily. 30 tablet 5  . glimepiride (AMARYL) 2 MG tablet TAKE 1 TABLET (2 MG TOTAL) BY MOUTH DAILY BEFORE BREAKFAST. 90 tablet 1  . glucosamine-chondroitin 500-400 MG tablet Take 2 tablets by mouth daily.     Marland Kitchen glucose blood (ONE TOUCH ULTRA TEST) test strip Use as instructed 100 each 11  . levothyroxine (SYNTHROID, LEVOTHROID) 50 MCG tablet Take 50 mcg by mouth daily.     . metFORMIN (GLUCOPHAGE-XR) 500 MG 24 hr tablet Take 1 tablet (500 mg total) by mouth daily with breakfast. 90 tablet 1  . omeprazole (PRILOSEC) 20 MG capsule TAKE 1 CAPSULE TWICE DAILY 60 capsule 3  . ONE TOUCH ULTRA TEST test strip CHECK ONCE A DAY 50 each 3  .  traMADol (ULTRAM) 50 MG tablet Take 50 mg by mouth as needed for pain.    Marland Kitchen gabapentin (NEURONTIN) 300 MG capsule Take 1 capsule (300 mg total) by mouth at bedtime. 30 capsule 3   No current facility-administered medications for this visit.    Allergies:   Bactrim; Dilaudid; Hydrocodone; and Macrodantin    Social History:  The patient  reports that she has never smoked. She has never used smokeless tobacco. She reports that she does not drink alcohol or use illicit drugs.   Family History:  The patient's family history includes Aortic aneurysm in her mother and sister; Cancer in her mother; Diabetes in her mother; Heart attack in her mother; Hypertension in her mother; Pneumonia in her father; Stroke in her mother.    ROS:  Please see the history of present illness.   Otherwise, review  of systems are positive for aching pain in her legs which is worse at night.  They also to be worse before rain.   All other systems are reviewed and negative.    PHYSICAL EXAM: VS:  BP 140/84 mmHg  Pulse 72  Ht 5\' 2"  (1.575 m)  Wt 155 lb (70.308 kg)  BMI 28.34 kg/m2 , BMI Body mass index is 28.34 kg/(m^2). GEN: Well nourished, well developed, in no acute distress HEENT: normal Neck: no JVD, carotid bruits, or masses Cardiac: RRR; no murmurs, rubs, or gallops,no edema  Respiratory:  clear to auscultation bilaterally, normal work of breathing GI: soft, nontender, nondistended, + BS MS: no deformity or atrophy Skin: warm and dry, no rash Neuro:  Strength and sensation are intact Psych: euthymic mood, full affect   EKG:  EKG is not ordered today.    Recent Labs: 04/03/2014: Hemoglobin 12.8; Platelets 141* 08/21/2014: ALT 14; BUN 16; Creatinine 0.78; Potassium 4.8; Sodium 140; TSH 2.07    Lipid Panel    Component Value Date/Time   CHOL 176 08/21/2014 0812   TRIG 176.0* 08/21/2014 0812   HDL 47.50 08/21/2014 0812   CHOLHDL 4 08/21/2014 0812   VLDL 35.2 08/21/2014 0812   LDLCALC 93 08/21/2014 0812      Wt Readings from Last 3 Encounters:  08/23/14 155 lb (70.308 kg)  04/03/14 155 lb 3.2 oz (70.398 kg)  03/07/14 154 lb (69.854 kg)         ASSESSMENT AND PLAN:  1. heart murmur, with echocardiogram done in July 2015.The echo is satisfactory. The LV shows good function EF 60-65%. There is mild mitral regurgitation accounting for the murmur.  2. Hypercholesterolemia 3. past history of breast cancer of left breast treated with lumpectomy and radiation therapy 4. diabetes mellitus 5. malaise and fatigue. 6.  Diabetes mellitus.  Recent hemoglobin A1c 8.5.  Followed by Dr. Elease Norton 7.  Hypothyroidism, followed by Dr. Elease Norton  Current medicines are reviewed at length with the patient today.  The patient does not have concerns regarding medicines.  The following  changes have been made:  We are adding a trial of gabapentin 300 mg at bedtime to see if this will help her nocturnal leg pain.  She is diabetic and some of this discomfort could be diabetic neuropathy.  If it seems to help, the dose could be titrated upward in the future  Labs/ tests ordered today include:  No orders of the defined types were placed in this encounter.     Disposition:   FU with Dr. Mare Ferrari in 6 months for office visit, lipid panel, hepatic function panel, basal metabolic panel,  and A1c   Signed, Julie Coco, MD  08/23/2014 5:48 PM    [facility] [address], [place], [place]  [address] Phone: [phone]; Fax: [phone]

## 2014-08-25 ENCOUNTER — Other Ambulatory Visit: Payer: Self-pay | Admitting: Cardiology

## 2014-08-28 ENCOUNTER — Ambulatory Visit (INDEPENDENT_AMBULATORY_CARE_PROVIDER_SITE_OTHER): Payer: Medicare Other | Admitting: Family Medicine

## 2014-08-28 ENCOUNTER — Encounter: Payer: Self-pay | Admitting: Family Medicine

## 2014-08-28 VITALS — BP 126/70 | HR 76 | Temp 97.9°F | Ht 62.0 in | Wt 154.0 lb

## 2014-08-28 DIAGNOSIS — Z0000 Encounter for general adult medical examination without abnormal findings: Secondary | ICD-10-CM

## 2014-08-28 DIAGNOSIS — Z23 Encounter for immunization: Secondary | ICD-10-CM

## 2014-08-28 DIAGNOSIS — E1165 Type 2 diabetes mellitus with hyperglycemia: Secondary | ICD-10-CM

## 2014-08-28 DIAGNOSIS — E1140 Type 2 diabetes mellitus with diabetic neuropathy, unspecified: Secondary | ICD-10-CM

## 2014-08-28 LAB — HM DIABETES FOOT EXAM: HM Diabetic Foot Exam: NORMAL

## 2014-08-28 MED ORDER — METFORMIN HCL ER 750 MG PO TB24: 750.0000 mg | ORAL_TABLET | Freq: Every day | ORAL | Status: DC

## 2014-08-28 NOTE — Patient Instructions (Signed)
Take several blood sugar readings per week and alternate fastings with some pre-meal and occasional 2 hours post-meal.

## 2014-08-28 NOTE — Progress Notes (Signed)
Pre visit review using our clinic review tool, if applicable. No additional management support is needed unless otherwise documented below in the visit note. 

## 2014-08-28 NOTE — Progress Notes (Signed)
Subjective:    Patient ID: Julie Norton, female    DOB: [birth year], 81 y.o.   MRN: [id number]  HPI Patient seen for Medicare wellness exam and medical follow-up. Her chronic problems include history of idiopathic peripheral neuropathy, type 2 diabetes, remote history of breast cancer, hyperlipidemia, hypothyroidism. She had multiple recent lab studies reviewed with patient and are basically stable with exception that her A1c is poorly controlled -8.5%.  Takes metformin extended release 500 mgd once daily. We tried her on one thousand milligrams previously but she had GI symptoms. She is on Amaryl 2 mg daily. No hypoglycemia. Fasting blood sugars are generally over 130. No symptoms of polyuria or polydipsia.  Past Medical History  Diagnosis Date  . H/O: hysterectomy 1978  . Hypercholesteremia   . Hypothyroidism   . Dyspepsia   . Chest pain     intermittent right upper quadrant discomfort radiating around her back      . Tendinitis     of the right arm and shoulder  . Abdominal gas pain     suspected -- may be related to intermittent right upper quadrant discomfort radiating around her back      . Breast mass     left breast  . GERD (gastroesophageal reflux disease)   . Cancer     left breast   Past Surgical History  Procedure Laterality Date  . Tonsillectomy and adenoidectomy  1950    at 81 years of age  . Breast reduction surgery    . Bladder repair    . Lipoma excision    . Appendectomy  1978  . Breast lumpectomy  02/24/2011    central partial mastectomy - dr Margot Chimes  . Abdominal hysterectomy      reports that she has never smoked. She has never used smokeless tobacco. She reports that she does not drink alcohol or use illicit drugs. family history includes Aortic aneurysm in her mother and sister; Cancer in her mother; Diabetes in her mother; Heart attack in her mother; Hypertension in her mother; Pneumonia in her father; Stroke in her mother. Allergies  Allergen  Reactions  . Bactrim Nausea And Vomiting  . Dilaudid [Hydromorphone Hcl]   . Hydrocodone Nausea And Vomiting  . Macrodantin Nausea And Vomiting   1.  Risk factors based on Past Medical , Social, and Family history reviewed and as indicated above with no changes 2.  Limitations in physical activities None.  No recent falls. 3.  Depression/mood No active depression or anxiety issues 4.  Hearing No major deficits or recent changes. 5.  ADLs independent in all. 6.  Cognitive function (orientation to time and place, language, writing, speech,memory) no short or long term memory issues.  Language and judgement intact. 7.  Home Safety no issues 8.  Height, weight, and visual acuity.all stable. 9.  Counseling discussed cancer screenings.  She declines further colonoscopies. 10. Recommendation of preventive services. Tdap.  She wishes to return later for Prevnar 13. 11. Labs based on risk factors multiple recent labs are reviewed with pt. 12. Care Plan as above.   13. Other Providers Cardiology-Dr Mare Ferrari.  Oncology- Dr Lindi Adie. 14. Written schedule of screening/prevention services given to patient.    Review of Systems  Constitutional: Negative for fatigue and unexpected weight change.  Eyes: Negative for visual disturbance.  Respiratory: Negative for cough, chest tightness, shortness of breath and wheezing.   Cardiovascular: Negative for chest pain, palpitations and leg swelling.  Endocrine: Negative for polydipsia and  polyuria.  Musculoskeletal: Positive for arthralgias.  Neurological: Negative for dizziness, seizures, syncope, weakness, light-headedness and headaches.       Objective:   Physical Exam  Constitutional: She is oriented to person, place, and time. She appears well-developed and well-nourished.  Neck: Neck supple. No thyromegaly present.  Cardiovascular: Normal rate and regular rhythm.   Pulmonary/Chest: Effort normal and breath sounds normal. No respiratory distress.  She has no wheezes. She has no rales.  Abdominal: Soft. Bowel sounds are normal. She exhibits no distension. There is no tenderness. There is no rebound and no guarding.  Musculoskeletal: She exhibits no edema.  Neurological: She is alert and oriented to person, place, and time. No cranial nerve deficit.  Psychiatric: She has a normal mood and affect. Her behavior is normal.          Assessment & Plan:  #1 wellness visit. Patient needs tetanus booster. Continue yearly flu vaccine. She's had previous shingles vaccine. Needs Prevnar 13 and she declines today. She would like to return later for that #2 type 2 diabetes. Poorly controlled. We discussed options. She's had frequent UTIs in the past and is not a great candidate for SGLT-2 class. Her mother had bladder cancer and we do not recommend Actos. She's had lots of GI symptoms including frequent gas and dyspepsia and GLP-1 class may be difficult for her to tolerate. Cautious titration metformin extended release to 750 mg once daily. She is encouraged to get back to more diligent exercise and lose some weight and recheck in 3 months. Consider further titration of Amaryl at that time if not further to goal. We discussed also the fact that we might have to add some low dose long acting insulin at some point soon if not getting to goal.

## 2014-10-16 ENCOUNTER — Other Ambulatory Visit: Payer: Self-pay | Admitting: Family Medicine

## 2014-11-22 ENCOUNTER — Other Ambulatory Visit: Payer: Self-pay | Admitting: Family Medicine

## 2014-11-22 ENCOUNTER — Telehealth: Payer: Self-pay | Admitting: Family Medicine

## 2014-11-22 DIAGNOSIS — E038 Other specified hypothyroidism: Secondary | ICD-10-CM

## 2014-11-22 DIAGNOSIS — E1165 Type 2 diabetes mellitus with hyperglycemia: Principal | ICD-10-CM

## 2014-11-22 DIAGNOSIS — E1140 Type 2 diabetes mellitus with diabetic neuropathy, unspecified: Secondary | ICD-10-CM

## 2014-11-22 NOTE — Telephone Encounter (Signed)
Labs are ordered 

## 2014-11-22 NOTE — Telephone Encounter (Signed)
done

## 2014-11-22 NOTE — Telephone Encounter (Signed)
Yes, though she just had lipid in February.

## 2014-11-22 NOTE — Telephone Encounter (Signed)
Pt states her visit on Monday is to discuss a1c. Pt would like to have that done prior to her visit.  Also states dr Bobbye Riggs office wanted her to get cholesterol checked next time she had labs. Is it ok to schedule these labs?

## 2014-11-23 ENCOUNTER — Other Ambulatory Visit (INDEPENDENT_AMBULATORY_CARE_PROVIDER_SITE_OTHER): Payer: Medicare Other

## 2014-11-23 DIAGNOSIS — E038 Other specified hypothyroidism: Secondary | ICD-10-CM

## 2014-11-23 DIAGNOSIS — E1140 Type 2 diabetes mellitus with diabetic neuropathy, unspecified: Secondary | ICD-10-CM | POA: Diagnosis not present

## 2014-11-23 DIAGNOSIS — E1165 Type 2 diabetes mellitus with hyperglycemia: Secondary | ICD-10-CM

## 2014-11-23 LAB — LIPID PANEL
Cholesterol: 170 mg/dL (ref 0–200)
HDL: 43.6 mg/dL (ref >39.00)
LDL Cholesterol: 94 mg/dL (ref 0–99)
NONHDL: 126.4
TRIGLYCERIDES: 164 mg/dL — AB (ref 0.0–149.0)
Total CHOL/HDL Ratio: 4
VLDL: 32.8 mg/dL (ref 0.0–40.0)

## 2014-11-23 LAB — HEMOGLOBIN A1C: Hgb A1c MFr Bld: 7.7 % — ABNORMAL HIGH (ref 4.6–6.5)

## 2014-11-26 ENCOUNTER — Encounter: Payer: Self-pay | Admitting: Family Medicine

## 2014-11-26 ENCOUNTER — Ambulatory Visit (INDEPENDENT_AMBULATORY_CARE_PROVIDER_SITE_OTHER): Payer: Medicare Other | Admitting: Family Medicine

## 2014-11-26 ENCOUNTER — Ambulatory Visit (INDEPENDENT_AMBULATORY_CARE_PROVIDER_SITE_OTHER)
Admission: RE | Admit: 2014-11-26 | Discharge: 2014-11-26 | Disposition: A | Discharge status: Home / Self Care | Payer: Medicare Other | Source: Ambulatory Visit | Attending: Family Medicine | Admitting: Family Medicine

## 2014-11-26 VITALS — BP 124/70 | HR 74 | Temp 97.6°F | Wt 157.0 lb

## 2014-11-26 DIAGNOSIS — R059 Cough, unspecified: Secondary | ICD-10-CM

## 2014-11-26 DIAGNOSIS — E1140 Type 2 diabetes mellitus with diabetic neuropathy, unspecified: Secondary | ICD-10-CM

## 2014-11-26 DIAGNOSIS — E7800 Pure hypercholesterolemia, unspecified: Secondary | ICD-10-CM

## 2014-11-26 DIAGNOSIS — R05 Cough: Secondary | ICD-10-CM

## 2014-11-26 DIAGNOSIS — E1165 Type 2 diabetes mellitus with hyperglycemia: Principal | ICD-10-CM

## 2014-11-26 NOTE — Progress Notes (Signed)
Pre visit review using our clinic review tool, if applicable. No additional management support is needed unless otherwise documented below in the visit note. 

## 2014-11-26 NOTE — Progress Notes (Signed)
Subjective:    Patient ID: Julie Norton, female    DOB: 03/20/1930, 81 y.o.   MRN: [id number]  HPI Patient seen for medical follow-up  Type 2 diabetes. Recent A1c 7.7% which is improved. She remains on low-dose glimepiride and metformin. She's had tendency toward GI upset with higher doses of metformin. In consistent with exercise recently  Patient has hyperlipidemia. Takes low-dose Lipitor. Previously was on Zetia but took her self off. No history of CAD. No myalgias. Lipids reviewed. Minimally elevated triglycerides otherwise lipids at goal.  Patient has had occasional dry cough for the past few weeks. She is requesting chest x-ray. No hemoptysis. No dyspnea. Remote history of breast cancer. No recent appetite or weight changes.  Past Medical History  Diagnosis Date  . H/O: hysterectomy 1978  . Hypercholesteremia   . Hypothyroidism   . Dyspepsia   . Chest pain     intermittent right upper quadrant discomfort radiating around her back      . Tendinitis     of the right arm and shoulder  . Abdominal gas pain     suspected -- may be related to intermittent right upper quadrant discomfort radiating around her back      . Breast mass     left breast  . GERD (gastroesophageal reflux disease)   . Cancer     left breast   Past Surgical History  Procedure Laterality Date  . Tonsillectomy and adenoidectomy  1950    at 81 years of age  . Breast reduction surgery    . Bladder repair    . Lipoma excision    . Appendectomy  1978  . Breast lumpectomy  02/24/2011    central partial mastectomy - dr Margot Chimes  . Abdominal hysterectomy      reports that she has never smoked. She has never used smokeless tobacco. She reports that she does not drink alcohol or use illicit drugs. family history includes Aortic aneurysm in her mother and sister; Cancer in her mother; Diabetes in her mother; Heart attack in her mother; Hypertension in her mother; Pneumonia in her father; Stroke in her  mother. Allergies  Allergen Reactions  . Bactrim Nausea And Vomiting  . Dilaudid [Hydromorphone Hcl]   . Hydrocodone Nausea And Vomiting  . Macrodantin Nausea And Vomiting      Review of Systems  Constitutional: Negative for fatigue and unexpected weight change.  HENT: Negative for trouble swallowing.   Eyes: Negative for visual disturbance.  Respiratory: Positive for cough. Negative for chest tightness, shortness of breath and wheezing.   Cardiovascular: Negative for chest pain, palpitations and leg swelling.  Gastrointestinal: Negative for abdominal pain.  Endocrine: Negative for polydipsia and polyuria.  Neurological: Negative for dizziness, seizures, syncope, weakness, light-headedness and headaches.       Objective:   Physical Exam  Constitutional: She appears well-developed and well-nourished.  Neck: Neck supple. No JVD present.  Cardiovascular: Normal rate and regular rhythm.   Pulmonary/Chest: Effort normal and breath sounds normal. No respiratory distress. She has no wheezes. She has no rales.  Musculoskeletal: She exhibits no edema.  Skin:  Feet reveal dry skin but no lesions. Normal sensory function          Assessment & Plan:  #1 type 2 diabetes. Improving control. We elected no further medications but work on more consistent exercise and reduction sugars and starches and reassess 3 months. Consider further titration of metformin then if no further improvement  #2 dyslipidemia. Lipids  stable in recent labs reviewed with patient. #3 recent cough. No red flags such as appetite or weight changes. Lung exam unremarkable. Chest x-ray ordered

## 2014-12-11 ENCOUNTER — Other Ambulatory Visit: Payer: Self-pay | Admitting: Obstetrics & Gynecology

## 2014-12-11 ENCOUNTER — Other Ambulatory Visit: Payer: Self-pay

## 2014-12-11 DIAGNOSIS — Z0000 Encounter for general adult medical examination without abnormal findings: Secondary | ICD-10-CM

## 2014-12-12 ENCOUNTER — Other Ambulatory Visit: Payer: Self-pay | Admitting: Obstetrics & Gynecology

## 2014-12-12 DIAGNOSIS — Z853 Personal history of malignant neoplasm of breast: Secondary | ICD-10-CM

## 2014-12-17 ENCOUNTER — Other Ambulatory Visit: Payer: Self-pay | Admitting: Cardiology

## 2014-12-18 ENCOUNTER — Other Ambulatory Visit: Payer: Self-pay | Admitting: *Deleted

## 2014-12-18 ENCOUNTER — Other Ambulatory Visit: Payer: Self-pay | Admitting: Cardiology

## 2014-12-18 ENCOUNTER — Telehealth: Payer: Self-pay | Admitting: *Deleted

## 2014-12-18 MED ORDER — LEVOTHYROXINE SODIUM 50 MCG PO TABS: 50.0000 ug | ORAL_TABLET | Freq: Every day | ORAL | Status: DC

## 2014-12-18 MED ORDER — ATORVASTATIN CALCIUM 10 MG PO TABS: ORAL_TABLET | ORAL | Status: DC

## 2014-12-18 MED ORDER — METFORMIN HCL ER 750 MG PO TB24: 750.0000 mg | ORAL_TABLET | Freq: Every day | ORAL | Status: DC

## 2014-12-18 MED ORDER — GLIMEPIRIDE 2 MG PO TABS: ORAL_TABLET | ORAL | Status: DC

## 2014-12-18 MED ORDER — OMEPRAZOLE 20 MG PO CPDR: 20.0000 mg | DELAYED_RELEASE_CAPSULE | Freq: Two times a day (BID) | ORAL | Status: DC

## 2014-12-18 NOTE — Telephone Encounter (Signed)
Patient is requesting a refill of levothyroxine.  This prescription has not be filled in a while.  Okay to fill?

## 2014-12-18 NOTE — Telephone Encounter (Signed)
Refill for 6 months. 

## 2014-12-21 ENCOUNTER — Other Ambulatory Visit: Payer: Self-pay | Admitting: Cardiology

## 2014-12-21 ENCOUNTER — Other Ambulatory Visit: Payer: Self-pay | Admitting: *Deleted

## 2014-12-21 MED ORDER — ATORVASTATIN CALCIUM 10 MG PO TABS: ORAL_TABLET | ORAL | Status: DC

## 2014-12-24 ENCOUNTER — Other Ambulatory Visit: Payer: Self-pay

## 2014-12-24 ENCOUNTER — Telehealth: Payer: Self-pay | Admitting: Family Medicine

## 2014-12-24 NOTE — Telephone Encounter (Signed)
Pt is aware.  

## 2014-12-24 NOTE — Telephone Encounter (Signed)
Pt needs new rx levothyroxine  50 mcg once a day #90 w/refills call [facility] [phone].

## 2014-12-24 NOTE — Telephone Encounter (Signed)
Refill done on Lipitor. Pharmacist stated there was not a problem with the Levothyroxine medication .

## 2015-01-01 ENCOUNTER — Other Ambulatory Visit: Payer: Self-pay | Admitting: Family Medicine

## 2015-01-15 ENCOUNTER — Ambulatory Visit
Admission: RE | Admit: 2015-01-15 | Discharge: 2015-01-15 | Disposition: A | Discharge status: Home / Self Care | Payer: Medicare Other | Source: Ambulatory Visit | Attending: Obstetrics & Gynecology | Admitting: Obstetrics & Gynecology

## 2015-01-15 DIAGNOSIS — R922 Inconclusive mammogram: Secondary | ICD-10-CM | POA: Diagnosis not present

## 2015-01-15 DIAGNOSIS — Z853 Personal history of malignant neoplasm of breast: Secondary | ICD-10-CM

## 2015-01-31 DIAGNOSIS — Z01419 Encounter for gynecological examination (general) (routine) without abnormal findings: Secondary | ICD-10-CM | POA: Diagnosis not present

## 2015-01-31 DIAGNOSIS — M8588 Other specified disorders of bone density and structure, other site: Secondary | ICD-10-CM | POA: Diagnosis not present

## 2015-01-31 DIAGNOSIS — Z6829 Body mass index (BMI) 29.0-29.9, adult: Secondary | ICD-10-CM | POA: Diagnosis not present

## 2015-01-31 DIAGNOSIS — N958 Other specified menopausal and perimenopausal disorders: Secondary | ICD-10-CM | POA: Diagnosis not present

## 2015-02-19 ENCOUNTER — Other Ambulatory Visit: Payer: Self-pay | Admitting: Family Medicine

## 2015-02-19 ENCOUNTER — Telehealth: Payer: Self-pay | Admitting: Family Medicine

## 2015-02-19 DIAGNOSIS — E1165 Type 2 diabetes mellitus with hyperglycemia: Principal | ICD-10-CM

## 2015-02-19 DIAGNOSIS — E1140 Type 2 diabetes mellitus with diabetic neuropathy, unspecified: Secondary | ICD-10-CM

## 2015-02-19 DIAGNOSIS — K045 Chronic apical periodontitis: Secondary | ICD-10-CM | POA: Diagnosis not present

## 2015-02-19 NOTE — Telephone Encounter (Signed)
Pt would like to know if she need to have her A1C check before her visit on Friday or if the doctor will check it the same day

## 2015-02-19 NOTE — Telephone Encounter (Signed)
Pt is scheduled °

## 2015-02-19 NOTE — Telephone Encounter (Signed)
Can you please call and schedule pt appointment. Lab is ordered.

## 2015-02-19 NOTE — Telephone Encounter (Signed)
Okay to go ahead and get an A1c

## 2015-02-19 NOTE — Telephone Encounter (Signed)
lmovm to call and schedule lab °

## 2015-02-20 ENCOUNTER — Other Ambulatory Visit (INDEPENDENT_AMBULATORY_CARE_PROVIDER_SITE_OTHER): Payer: Medicare Other

## 2015-02-20 DIAGNOSIS — E1140 Type 2 diabetes mellitus with diabetic neuropathy, unspecified: Secondary | ICD-10-CM

## 2015-02-20 DIAGNOSIS — E1165 Type 2 diabetes mellitus with hyperglycemia: Principal | ICD-10-CM

## 2015-02-20 LAB — HEMOGLOBIN A1C: HEMOGLOBIN A1C: 8.4 % — AB (ref 4.6–6.5)

## 2015-02-22 ENCOUNTER — Ambulatory Visit (INDEPENDENT_AMBULATORY_CARE_PROVIDER_SITE_OTHER): Payer: Medicare Other | Admitting: Family Medicine

## 2015-02-22 ENCOUNTER — Encounter: Payer: Self-pay | Admitting: Family Medicine

## 2015-02-22 VITALS — BP 130/70 | HR 74 | Temp 98.0°F | Wt 153.0 lb

## 2015-02-22 DIAGNOSIS — E1140 Type 2 diabetes mellitus with diabetic neuropathy, unspecified: Secondary | ICD-10-CM

## 2015-02-22 DIAGNOSIS — E1165 Type 2 diabetes mellitus with hyperglycemia: Principal | ICD-10-CM

## 2015-02-22 MED ORDER — METFORMIN HCL ER 500 MG PO TB24: ORAL_TABLET | ORAL | Status: DC

## 2015-02-22 NOTE — Progress Notes (Signed)
Pre visit review using our clinic review tool, if applicable. No additional management support is needed unless otherwise documented below in the visit note. 

## 2015-02-22 NOTE — Progress Notes (Signed)
   Subjective:    Patient ID: Julie Norton, female    DOB: 04-Apr-1930, 81 y.o.   MRN: [id number]  HPI Patient seen for follow-up type 2 diabetes. Comp cases of neuropathy. A1c 8.4%. She remains on glimepiride 2 mg daily and metformin extended release 750 mg once daily. She has had difficulty tolerating higher doses of metformin in the past but has done better with extended release. Not been exercising much recently. Poor compliance with diet at times. No polyuria or polydipsia.  Past Medical History  Diagnosis Date  . H/O: hysterectomy 1978  . Hypercholesteremia   . Hypothyroidism   . Dyspepsia   . Chest pain     intermittent right upper quadrant discomfort radiating around her back      . Tendinitis     of the right arm and shoulder  . Abdominal gas pain     suspected -- may be related to intermittent right upper quadrant discomfort radiating around her back      . Breast mass     left breast  . GERD (gastroesophageal reflux disease)   . Cancer     left breast   Past Surgical History  Procedure Laterality Date  . Tonsillectomy and adenoidectomy  1950    at 81 years of age  . Breast reduction surgery    . Bladder repair    . Lipoma excision    . Appendectomy  1978  . Breast lumpectomy  02/24/2011    central partial mastectomy - dr Margot Chimes  . Abdominal hysterectomy      reports that she has never smoked. She has never used smokeless tobacco. She reports that she does not drink alcohol or use illicit drugs. family history includes Aortic aneurysm in her mother and sister; Cancer in her mother; Diabetes in her mother; Heart attack in her mother; Hypertension in her mother; Pneumonia in her father; Stroke in her mother. Allergies  Allergen Reactions  . Bactrim Nausea And Vomiting  . Dilaudid [Hydromorphone Hcl]   . Hydrocodone Nausea And Vomiting  . Macrodantin Nausea And Vomiting      Review of Systems  Constitutional: Negative for fatigue.  Eyes: Negative for visual  disturbance.  Respiratory: Negative for cough, chest tightness, shortness of breath and wheezing.   Cardiovascular: Negative for chest pain, palpitations and leg swelling.  Endocrine: Negative for polydipsia and polyuria.  Neurological: Negative for dizziness, seizures, syncope, weakness, light-headedness and headaches.       Objective:   Physical Exam  Constitutional: She appears well-developed and well-nourished.  Neck: Neck supple. No thyromegaly present.  Cardiovascular: Normal rate and regular rhythm.   Pulmonary/Chest: Effort normal and breath sounds normal. No respiratory distress. She has no wheezes. She has no rales.  Musculoskeletal: She exhibits no edema.          Assessment & Plan:  Type 2 diabetes. Sub-optimal control. Increase metformin extended release to 500 mg twice daily.  Increase exercise. Continue Amaryl. Repeat A1c in 3 months

## 2015-03-17 ENCOUNTER — Other Ambulatory Visit: Payer: Self-pay | Admitting: Family Medicine

## 2015-03-20 ENCOUNTER — Other Ambulatory Visit: Payer: Self-pay

## 2015-03-20 MED ORDER — GLUCOSE BLOOD VI STRP: ORAL_STRIP | Status: DC

## 2015-03-20 MED ORDER — GLUCOSE BLOOD VI STRP: ORAL_STRIP | Status: AC

## 2015-04-08 ENCOUNTER — Telehealth: Payer: Self-pay | Admitting: Hematology and Oncology

## 2015-04-08 ENCOUNTER — Encounter: Payer: Self-pay | Admitting: Hematology and Oncology

## 2015-04-08 ENCOUNTER — Ambulatory Visit (HOSPITAL_BASED_OUTPATIENT_CLINIC_OR_DEPARTMENT_OTHER): Payer: Medicare Other | Admitting: Hematology and Oncology

## 2015-04-08 VITALS — BP 153/67 | HR 70 | Temp 97.9°F | Resp 18 | Ht 62.0 in | Wt 154.6 lb

## 2015-04-08 DIAGNOSIS — Z853 Personal history of malignant neoplasm of breast: Secondary | ICD-10-CM

## 2015-04-08 DIAGNOSIS — D0512 Intraductal carcinoma in situ of left breast: Secondary | ICD-10-CM

## 2015-04-08 NOTE — Telephone Encounter (Signed)
Appointments made and avs printed for pateint °

## 2015-04-08 NOTE — Progress Notes (Signed)
Patient Care Team: Eulas Post, MD as PCP - General (Family Medicine) Neldon Mc, MD as Surgeon (General Surgery) Maisie Fus, MD (Obstetrics and Gynecology)  DIAGNOSIS: ductal carcinoma in situ of the left breast status post central lumpectomy on 02/24/2011.   PRIOR THERAPY:  #1 patient underwent a central lumpectomy with removal of the left nipple area LOC complex on 02/24/2011. The final pathology revealed intermediate to high-grade ductal carcinoma in situ. Tumor was ER +99% PR +100%.  #2 patient then received radiation therapy from 04/14/2011 2 05/08/2011  #3 patient declined antiestrogen therapy.  CURRENT THERAPY:Observation CHIEF COMPLIANT: Annual follow-up of left breast DCIS  INTERVAL HISTORY: Julie Norton is a 81 year old soon-to-be 81 year old lady with above-mentioned history of left breast DCIS treated with lumpectomy and radiation and declined antiestrogen therapy. She is doing extremely well without any major problems or concerns. She still stays extremely active and even works on a part-time basis. She denies any lumps or nodules in the breasts.  REVIEW OF SYSTEMS:   Constitutional: Denies fevers, chills or abnormal weight loss Eyes: Denies blurriness of vision Ears, nose, mouth, throat, and face: Denies mucositis or sore throat Respiratory: Denies cough, dyspnea or wheezes Cardiovascular: Denies palpitation, chest discomfort or lower extremity swelling Gastrointestinal:  Denies nausea, heartburn or change in bowel habits Skin: Denies abnormal skin rashes Lymphatics: Denies new lymphadenopathy or easy bruising Neurological:Denies numbness, tingling or new weaknesses Behavioral/Psych: Mood is stable, no new changes  Breast:  denies any pain or lumps or nodules in either breasts All other systems were reviewed with the patient and are negative.  I have reviewed the past medical history, past surgical history, social history and family history with  the patient and they are unchanged from previous note.  ALLERGIES:  is allergic to bactrim; dilaudid; hydrocodone; and macrodantin.  MEDICATIONS:  Current Outpatient Prescriptions  Medication Sig Dispense Refill  . aspirin 325 MG EC tablet Take 325 mg by mouth daily.    Marland Kitchen atorvastatin (LIPITOR) 10 MG tablet Take one-half tablet by  mouth every other day or as directed 45 tablet 3  . Calcium Carbonate-Vitamin D (CALCIUM 600+D) 600-200 MG-UNIT TABS Take 2 tablets by mouth daily.     . Cholecalciferol (VITAMIN D PO) Take by mouth daily.     Marland Kitchen glimepiride (AMARYL) 2 MG tablet Take 1 tablet by mouth  daily before breakfast 90 tablet 2  . glucosamine-chondroitin 500-400 MG tablet Take 2 tablets by mouth daily.     Marland Kitchen glucose blood (ONE TOUCH ULTRA TEST) test strip Check once daily. E11.40 100 each 11  . levothyroxine (SYNTHROID, LEVOTHROID) 50 MCG tablet Take 1 tablet (50 mcg total) by mouth daily. 90 tablet 1  . metFORMIN (GLUCOPHAGE-XR) 500 MG 24 hr tablet Take one po bid 180 tablet 3  . omeprazole (PRILOSEC) 20 MG capsule Take 1 capsule (20 mg total) by mouth 2 (two) times daily. 180 capsule 2  . ONE TOUCH ULTRA TEST test strip CHECK ONCE A DAY 50 each 3   No current facility-administered medications for this visit.    PHYSICAL EXAMINATION: ECOG PERFORMANCE STATUS: 0 - Asymptomatic  Filed Vitals:   04/08/15 1100  BP: 153/67  Pulse: 70  Temp: 97.9 F (36.6 C)  Resp: 18   Filed Weights   04/08/15 1100  Weight: 154 lb 9.6 oz (70.126 kg)    GENERAL:alert, no distress and comfortable SKIN: skin color, texture, turgor are normal, no rashes or significant lesions EYES: normal, Conjunctiva are pink and  non-injected, sclera clear OROPHARYNX:no exudate, no erythema and lips, buccal mucosa, and tongue normal  NECK: supple, thyroid normal size, non-tender, without nodularity LYMPH:  no palpable lymphadenopathy in the cervical, axillary or inguinal LUNGS: clear to auscultation and  percussion with normal breathing effort HEART: regular rate & rhythm and no murmurs and no lower extremity edema ABDOMEN:abdomen soft, non-tender and normal bowel sounds Musculoskeletal:no cyanosis of digits and no clubbing  NEURO: alert & oriented x 3 with fluent speech, no focal motor/sensory deficits BREAST: No palpable masses or nodules in either right or left breasts. Left nipple is absent. No palpable axillary supraclavicular or infraclavicular adenopathy no breast tenderness or nipple discharge. (exam performed in the presence of a chaperone)  LABORATORY DATA:  I have reviewed the data as listed   Chemistry      Component Value Date/Time   NA 140 08/21/2014 0812   NA 142 04/03/2014 1026   K 4.8 08/21/2014 0812   K 4.1 04/03/2014 1026   CL 107 08/21/2014 0812   CO2 28 08/21/2014 0812   CO2 27 04/03/2014 1026   BUN 16 08/21/2014 0812   BUN 11.8 04/03/2014 1026   CREATININE 0.78 08/21/2014 0812   CREATININE 0.8 04/03/2014 1026   CREATININE 0.68 11/27/2011 1856      Component Value Date/Time   CALCIUM 9.6 08/21/2014 0812   CALCIUM 9.7 04/03/2014 1026   ALKPHOS 69 08/21/2014 0812   ALKPHOS 75 04/03/2014 1026   AST 17 08/21/2014 0812   AST 20 04/03/2014 1026   ALT 14 08/21/2014 0812   ALT 15 04/03/2014 1026   BILITOT 0.7 08/21/2014 0812   BILITOT 0.76 04/03/2014 1026       Lab Results  Component Value Date   WBC 6.2 04/03/2014   HGB 12.8 04/03/2014   HCT 37.4 04/03/2014   MCV 85.2 04/03/2014   PLT 141* 04/03/2014   NEUTROABS 3.6 04/03/2014     RADIOGRAPHIC STUDIES: Mammogram July 2016 is normal  ASSESSMENT & PLAN:  Ductal carcinoma in situ (DCIS) of left breast High grade DCIS on February 02, 2011. She underwent left central partial mastectomy on February 24, 2011. She had intermediate to high grade DCIS with negative surgical margins. ER was positive at 99%, PR positive at 100%; Patient refused tamoxifen therapy. She is currently on surveillance.  Breast Cancer  Surveillance: 1. Breast exam 04/08/2015: Normal 2. Mammogram 01/15/2015 No abnormalities. Postsurgical changes. Breast Density Category C. I recommended that she get 3-D mammograms for surveillance. Discussed the differences between different breast density categories.   Return to clinic in 1 year for surveillance and follow-up. After next year which will be 5 years from diagnosis, she can choose to be seen at the survivorship.  The alternative would be to follow up with her primary care physician.  No orders of the defined types were placed in this encounter.   The patient has a good understanding of the overall plan. she agrees with it. she will call with any problems that may develop before the next visit here.   Rulon Eisenmenger, MD

## 2015-04-08 NOTE — Assessment & Plan Note (Signed)
High grade DCIS on February 02, 2011. She underwent left central partial mastectomy on February 24, 2011. She had intermediate to high grade DCIS with negative surgical margins. ER was positive at 99%, PR positive at 100%; Patient refused tamoxifen therapy. She is currently on surveillance.  Breast Cancer Surveillance: 1. Breast exam 04/08/2015: Normal 2. Mammogram 01/15/2015 No abnormalities. Postsurgical changes. Breast Density Category C. I recommended that she get 3-D mammograms for surveillance. Discussed the differences between different breast density categories.   Return to clinic in 1 year for surveillance and follow-up.

## 2015-04-10 ENCOUNTER — Telehealth: Payer: Self-pay | Admitting: Cardiology

## 2015-04-10 DIAGNOSIS — E7800 Pure hypercholesterolemia, unspecified: Secondary | ICD-10-CM

## 2015-04-10 NOTE — Telephone Encounter (Signed)
Left message to call back  

## 2015-04-10 NOTE — Telephone Encounter (Signed)
New Message  Pt called for lab appt for 11/10 please place orders if needed. Thanks

## 2015-04-11 NOTE — Telephone Encounter (Signed)
Spoke with wife and put orders in Epic for labs Patient going to PCP to get done

## 2015-04-11 NOTE — Telephone Encounter (Signed)
Follow up ° ° ° ° ° °Returning a call to the nurse °

## 2015-04-18 ENCOUNTER — Encounter: Payer: Self-pay | Admitting: Family Medicine

## 2015-04-29 ENCOUNTER — Telehealth: Payer: Self-pay | Admitting: Family Medicine

## 2015-04-29 NOTE — Telephone Encounter (Signed)
Pt is schedule  to have blood work here on 05-23-15 and would like to also have labs dr Mare Ferrari order as well. Can I schedule?

## 2015-04-29 NOTE — Telephone Encounter (Signed)
Left message on vm for pt to call back.  

## 2015-04-29 NOTE — Telephone Encounter (Signed)
Please advise 

## 2015-04-29 NOTE — Telephone Encounter (Signed)
yes

## 2015-05-01 ENCOUNTER — Ambulatory Visit: Payer: Medicare Other | Admitting: Cardiology

## 2015-05-01 ENCOUNTER — Ambulatory Visit (INDEPENDENT_AMBULATORY_CARE_PROVIDER_SITE_OTHER): Payer: Medicare Other | Admitting: Family Medicine

## 2015-05-01 DIAGNOSIS — Z23 Encounter for immunization: Secondary | ICD-10-CM

## 2015-05-02 NOTE — Telephone Encounter (Signed)
Left message on vm for pt to call back.  

## 2015-05-05 ENCOUNTER — Other Ambulatory Visit: Payer: Self-pay | Admitting: Family Medicine

## 2015-05-07 NOTE — Telephone Encounter (Signed)
Left message on vm for pt to call back.  

## 2015-05-08 NOTE — Telephone Encounter (Signed)
Pt has been sch

## 2015-05-14 ENCOUNTER — Other Ambulatory Visit: Payer: Medicare Other

## 2015-05-16 ENCOUNTER — Ambulatory Visit (INDEPENDENT_AMBULATORY_CARE_PROVIDER_SITE_OTHER): Payer: Medicare Other | Admitting: Cardiology

## 2015-05-16 ENCOUNTER — Encounter: Payer: Self-pay | Admitting: Cardiology

## 2015-05-16 VITALS — BP 136/80 | HR 68 | Ht 62.0 in | Wt 152.8 lb

## 2015-05-16 DIAGNOSIS — I4510 Unspecified right bundle-branch block: Secondary | ICD-10-CM

## 2015-05-16 DIAGNOSIS — R0789 Other chest pain: Secondary | ICD-10-CM

## 2015-05-16 DIAGNOSIS — R079 Chest pain, unspecified: Secondary | ICD-10-CM | POA: Insufficient documentation

## 2015-05-16 NOTE — Patient Instructions (Signed)
Medication Instructions:  Your physician recommends that you continue on your current medications as directed. Please refer to the Current Medication list given to you today.  Labwork: none  Testing/Procedures: Your physician has requested that you have a lexiscan myoview. For further information please visit [email or web address]. Please follow instruction sheet, as given.  Follow-Up: Your physician wants you to follow-up in: 6 month ov/ekg with Dr Martinique  You will receive a reminder letter in the mail two months in advance. If you don't receive a letter, please call our office to schedule the follow-up appointment.  If you need a refill on your cardiac medications before your next appointment, please call your pharmacy.

## 2015-05-16 NOTE — Progress Notes (Signed)
Cardiology Office Note   Date:  05/16/2015   ID:  Julie Norton, DOB 06-Apr-1930, MRN [id number]  PCP:  Eulas Post, MD  Cardiologist: Darlin Coco MD  Chief Complaint  Patient presents with  . Heart Murmur      History of Present Illness: Julie Norton is a 81 y.o. female who presents for  Six-month follow-up visit  This pleasant 81 year old woman is seen for a scheduled followup office visit. She has a past history of hypercholesterolemia. She also has a history of left breast cancer. She finished radiation therapy in November 2012. Her surgeon is Dr. Margot Chimes. The patient elected not to go on tamoxifen. When we last saw her she was complaining of some right upper quadrant discomfort. However we went ahead and did an gallbladder ultrasound on 11/16/11 which was negative for cholelithiasis. Symptoms have improved. Since last visit the patient has had problems with leg pain and has been evaluated by orthopedics. She has been complaining of lack of energy. She has had some shortness of breath and easy fatigue. She has a history of diabetes. She does have hypothyroidism managed by Dr. Elease Hashimoto.  she has had some mid sternal chest pain often occurring early in the morning. She has attributed it to gas. She takes  omeprazole  twice a day. Her blood pressure here in the office today was elevated but at home and has been normal.  Her last Myoview stress test was in 2007 and was normal at that time.  EKG today shows new right bundle branch block. Past Medical History  Diagnosis Date  . H/O: hysterectomy 1978  . Hypercholesteremia   . Hypothyroidism   . Dyspepsia   . Chest pain     intermittent right upper quadrant discomfort radiating around her back      . Tendinitis     of the right arm and shoulder  . Abdominal gas pain     suspected -- may be related to intermittent right upper quadrant discomfort radiating around her back      . Breast mass     left breast  .  GERD (gastroesophageal reflux disease)   . Cancer [facility])     left breast    Past Surgical History  Procedure Laterality Date  . Tonsillectomy and adenoidectomy  1950    at 81 years of age  . Breast reduction surgery    . Bladder repair    . Lipoma excision    . Appendectomy  1978  . Breast lumpectomy  02/24/2011    central partial mastectomy - dr Margot Chimes  . Abdominal hysterectomy       Current Outpatient Prescriptions  Medication Sig Dispense Refill  . aspirin 325 MG EC tablet Take 325 mg by mouth daily.    Marland Kitchen atorvastatin (LIPITOR) 10 MG tablet Take one-half tablet by  mouth every other day or as directed 45 tablet 3  . Calcium Carbonate-Vitamin D (CALCIUM 600+D) 600-200 MG-UNIT TABS Take 2 tablets by mouth daily.     . Cholecalciferol (VITAMIN D PO) Take 1 tablet by mouth daily.     Marland Kitchen glimepiride (AMARYL) 2 MG tablet Take 1 tablet by mouth  daily before breakfast 90 tablet 2  . glucosamine-chondroitin 500-400 MG tablet Take 2 tablets by mouth daily.     Marland Kitchen glucose blood (ONE TOUCH ULTRA TEST) test strip Check once daily. E11.40 100 each 11  . levothyroxine (SYNTHROID, LEVOTHROID) 50 MCG tablet Take 1 tablet by mouth  daily  90 tablet 3  . metFORMIN (GLUCOPHAGE) 500 MG tablet Take 500 mg by mouth 2 (two) times daily with a meal.    . omeprazole (PRILOSEC) 20 MG capsule Take 1 capsule (20 mg total) by mouth 2 (two) times daily. 180 capsule 2  . ONE TOUCH ULTRA TEST test strip CHECK ONCE A DAY 50 each 3   No current facility-administered medications for this visit.    Allergies:   Bactrim; Dilaudid; Hydrocodone; and Macrodantin    Social History:  The patient  reports that she has never smoked. She has never used smokeless tobacco. She reports that she does not drink alcohol or use illicit drugs.   Family History:  The patient's family history includes Aortic aneurysm in her mother and sister; Cancer in her mother; Diabetes in her mother; Heart attack in her mother; Hypertension in  her mother; Pneumonia in her father; Stroke in her mother.    ROS:  Please see the history of present illness.   Otherwise, review of systems are positive for none.   All other systems are reviewed and negative.    PHYSICAL EXAM: VS:  BP 136/80 mmHg  Pulse 68  Ht 5\' 2"  (1.575 m)  Wt 152 lb 12.8 oz (69.31 kg)  BMI 27.94 kg/m2 , BMI Body mass index is 27.94 kg/(m^2). GEN: Well nourished, well developed, in no acute distress HEENT: normal Neck: no JVD, carotid bruits, or masses Cardiac: RRR; no murmurs, rubs, or gallops,no edema  Respiratory:  clear to auscultation bilaterally, normal work of breathing GI: soft, nontender, nondistended, + BS MS: no deformity or atrophy Skin: warm and dry, no rash Neuro:  Strength and sensation are intact Psych: euthymic mood, full affect   EKG:  EKG is ordered today. The ekg ordered today demonstrates  Normal sinus rhythm at 68 bpm. Since previous tracing of 7/20/ 15, right bundle branch block is now present   Recent Labs: 08/21/2014: ALT 14; BUN 16; Creatinine, Ser 0.78; Potassium 4.8; Sodium 140; TSH 2.07    Lipid Panel    Component Value Date/Time   CHOL 170 11/23/2014 0825   TRIG 164.0* 11/23/2014 0825   HDL 43.60 11/23/2014 0825   CHOLHDL 4 11/23/2014 0825   VLDL 32.8 11/23/2014 0825   LDLCALC 94 11/23/2014 0825      Wt Readings from Last 3 Encounters:  05/16/15 152 lb 12.8 oz (69.31 kg)  04/08/15 154 lb 9.6 oz (70.126 kg)  02/22/15 153 lb (69.4 kg)         ASSESSMENT AND PLAN:  1. heart murmur, with echocardiogram done in July 2015.The echo is satisfactory. The LV shows good function EF 60-65%. There is mild mitral regurgitation accounting for the murmur.  2. Hypercholesterolemia 3. past history of breast cancer of left breast treated with lumpectomy and radiation therapy 4. diabetes mellitus 5. malaise and fatigue. 6. Diabetes mellitus. Recent hemoglobin A1c 8.5. Followed by Dr. Elease Hashimoto 7. Hypothyroidism,  followed by Dr. Elease Hashimoto   Current medicines are reviewed at length with the patient today.  The patient does not have concerns regarding medicines.  The following changes have been made:  no change  Labs/ tests ordered today include:   Orders Placed This Encounter  Procedures  . Myocardial Perfusion Imaging  . EKG 12-Lead      disposition: We will have her return for a Lexiscan Myoview stress test to evaluate her chest pain and new right bundle branch block. She'll return in 6 months for a follow-up office visit with  Dr. Peter Martinique.  Berna Spare MD 05/16/2015 6:36 PM    [address], [place], [place]  [address] Phone: [phone]; Fax: [phone]

## 2015-05-22 ENCOUNTER — Telehealth (HOSPITAL_COMMUNITY): Payer: Self-pay | Admitting: *Deleted

## 2015-05-22 NOTE — Telephone Encounter (Signed)
Patient given detailed instructions per Myocardial Perfusion Study Information Sheet for the test on 05/27/15 at 945. Patient notified to arrive 15 minutes early and that it is imperative to arrive on time for appointment to keep from having the test rescheduled.  If you need to cancel or reschedule your appointment, please call the office within 24 hours of your appointment. Failure to do so may result in a cancellation of your appointment, and a $50 no show fee. Patient verbalized understanding.Hubbard Robinson, RN

## 2015-05-23 ENCOUNTER — Other Ambulatory Visit (INDEPENDENT_AMBULATORY_CARE_PROVIDER_SITE_OTHER): Payer: Medicare Other

## 2015-05-23 DIAGNOSIS — E7800 Pure hypercholesterolemia, unspecified: Secondary | ICD-10-CM | POA: Diagnosis not present

## 2015-05-23 DIAGNOSIS — E1165 Type 2 diabetes mellitus with hyperglycemia: Secondary | ICD-10-CM

## 2015-05-23 DIAGNOSIS — E1140 Type 2 diabetes mellitus with diabetic neuropathy, unspecified: Secondary | ICD-10-CM | POA: Diagnosis not present

## 2015-05-23 LAB — BASIC METABOLIC PANEL
BUN: 14 mg/dL (ref 6–23)
CALCIUM: 9.5 mg/dL (ref 8.4–10.5)
CO2: 25 mEq/L (ref 19–32)
Chloride: 107 mEq/L (ref 96–112)
Creatinine, Ser: 0.71 mg/dL (ref 0.40–1.20)
GFR: 83.53 mL/min (ref >60.00)
GLUCOSE: 174 mg/dL — AB (ref 70–99)
Potassium: 4.2 mEq/L (ref 3.5–5.1)
Sodium: 141 mEq/L (ref 135–145)

## 2015-05-23 LAB — HEPATIC FUNCTION PANEL
ALT: 16 U/L (ref 0–35)
AST: 20 U/L (ref 0–37)
Albumin: 4 g/dL (ref 3.5–5.2)
Alkaline Phosphatase: 61 U/L (ref 39–117)
BILIRUBIN TOTAL: 0.7 mg/dL (ref 0.2–1.2)
Bilirubin, Direct: 0.2 mg/dL (ref 0.0–0.3)
Total Protein: 6.7 g/dL (ref 6.0–8.3)

## 2015-05-23 LAB — LIPID PANEL
Cholesterol: 146 mg/dL (ref 0–200)
HDL: 43.9 mg/dL (ref >39.00)
LDL Cholesterol: 75 mg/dL (ref 0–99)
NONHDL: 102.45
Total CHOL/HDL Ratio: 3
Triglycerides: 135 mg/dL (ref 0.0–149.0)
VLDL: 27 mg/dL (ref 0.0–40.0)

## 2015-05-23 LAB — HEMOGLOBIN A1C: Hgb A1c MFr Bld: 8 % — ABNORMAL HIGH (ref 4.6–6.5)

## 2015-05-23 NOTE — Progress Notes (Signed)
Quick Note:  Please report to patient. The recent labs are stable. Continue same medication and careful diet. BS is too high consistent with diabetes. Follow up regarding diabetes with her PCP. A1C is pending. ______

## 2015-05-27 ENCOUNTER — Ambulatory Visit: Payer: Medicare Other | Admitting: Family Medicine

## 2015-05-27 ENCOUNTER — Ambulatory Visit (HOSPITAL_COMMUNITY): Payer: Medicare Other | Attending: Cardiology

## 2015-05-27 DIAGNOSIS — R079 Chest pain, unspecified: Secondary | ICD-10-CM

## 2015-05-27 DIAGNOSIS — E119 Type 2 diabetes mellitus without complications: Secondary | ICD-10-CM | POA: Diagnosis not present

## 2015-05-27 DIAGNOSIS — I4510 Unspecified right bundle-branch block: Secondary | ICD-10-CM | POA: Diagnosis not present

## 2015-05-27 DIAGNOSIS — R0609 Other forms of dyspnea: Secondary | ICD-10-CM | POA: Diagnosis not present

## 2015-05-27 DIAGNOSIS — R0602 Shortness of breath: Secondary | ICD-10-CM | POA: Diagnosis not present

## 2015-05-27 DIAGNOSIS — R0789 Other chest pain: Secondary | ICD-10-CM

## 2015-05-27 DIAGNOSIS — R5383 Other fatigue: Secondary | ICD-10-CM | POA: Insufficient documentation

## 2015-05-27 LAB — MYOCARDIAL PERFUSION IMAGING
LV dias vol: 72 mL
LV sys vol: 20 mL
Peak HR: 85 {beats}/min
RATE: 0.3
Rest HR: 66 {beats}/min
SDS: 3
SRS: 1
SSS: 4
TID: 0.93

## 2015-05-27 MED ORDER — TECHNETIUM TC 99M SESTAMIBI GENERIC - CARDIOLITE
11.0000 | Freq: Once | INTRAVENOUS | Status: AC | PRN
  Administered 2015-05-27: 11 via INTRAVENOUS

## 2015-05-27 MED ORDER — TECHNETIUM TC 99M SESTAMIBI GENERIC - CARDIOLITE
32.8000 | Freq: Once | INTRAVENOUS | Status: AC | PRN
  Administered 2015-05-27: 32.8 via INTRAVENOUS

## 2015-05-27 MED ORDER — REGADENOSON 0.4 MG/5ML IV SOLN
0.4000 mg | Freq: Once | INTRAVENOUS | Status: AC
  Administered 2015-05-27: 0.4 mg via INTRAVENOUS

## 2015-05-28 ENCOUNTER — Ambulatory Visit (INDEPENDENT_AMBULATORY_CARE_PROVIDER_SITE_OTHER): Payer: Medicare Other | Admitting: Family Medicine

## 2015-05-28 ENCOUNTER — Telehealth: Payer: Self-pay | Admitting: Cardiology

## 2015-05-28 ENCOUNTER — Encounter: Payer: Self-pay | Admitting: Family Medicine

## 2015-05-28 ENCOUNTER — Encounter (HOSPITAL_COMMUNITY): Payer: Medicare Other

## 2015-05-28 VITALS — BP 124/80 | HR 75 | Temp 97.7°F | Resp 16 | Ht 62.0 in | Wt 153.5 lb

## 2015-05-28 DIAGNOSIS — E7800 Pure hypercholesterolemia, unspecified: Secondary | ICD-10-CM

## 2015-05-28 DIAGNOSIS — E038 Other specified hypothyroidism: Secondary | ICD-10-CM | POA: Diagnosis not present

## 2015-05-28 DIAGNOSIS — E1165 Type 2 diabetes mellitus with hyperglycemia: Secondary | ICD-10-CM

## 2015-05-28 DIAGNOSIS — E1140 Type 2 diabetes mellitus with diabetic neuropathy, unspecified: Secondary | ICD-10-CM

## 2015-05-28 MED ORDER — CANAGLIFLOZIN 100 MG PO TABS: 100.0000 mg | ORAL_TABLET | Freq: Every day | ORAL | Status: DC

## 2015-05-28 NOTE — Telephone Encounter (Signed)
-----   Message from Darlin Coco, MD sent at 05/28/2015 10:09 AM EST ----- Please report.  The stress test is normal. Good LV systolic function. No ischemia. CSD.

## 2015-05-28 NOTE — Progress Notes (Signed)
Pre visit review using our clinic review tool, if applicable. No additional management support is needed unless otherwise documented below in the visit note. 

## 2015-05-28 NOTE — Telephone Encounter (Signed)
Advised patient of lab results and myoview

## 2015-05-28 NOTE — Telephone Encounter (Signed)
-----   Message from Darlin Coco, MD sent at 05/23/2015  4:29 PM EST ----- Please report to patient.  The recent labs are stable. Continue same medication and careful diet. BS is too high consistent with diabetes. Follow up regarding diabetes with her PCP. A1C is pending.

## 2015-05-28 NOTE — Telephone Encounter (Signed)
New problem ° ° °Pt returning your call. °

## 2015-05-28 NOTE — Progress Notes (Signed)
Subjective:    Patient ID: Julie Norton, female    DOB: 05/16/1930, 81 y.o.   MRN: [id number]  HPI   Patient seen for medical follow-up. She has history of hypertension, dyslipidemia, type 2 diabetes with history of polyneuropathy. Diabetes has been poorly controlled. Last A1c was improved but still suboptimal 8.0%. She's been intolerant of high doses of metformin. She is tolerating metformin 500 milligrams twice daily and also takes Amaryl 2 mg once daily. We have not explored other classes of medication.  She has been frustrated with inability to lose weight. No polyuria or polydipsia.  Blood pressures been very well controlled. She had recent lipids which are very well controlled on atorvastatin. No recent chest pains. Immunizations up-to-date Hypothyroid on replacement and compliant with therapy.  Past Medical History  Diagnosis Date  . H/O: hysterectomy 1978  . Hypercholesteremia   . Hypothyroidism   . Dyspepsia   . Chest pain     intermittent right upper quadrant discomfort radiating around her back      . Tendinitis     of the right arm and shoulder  . Abdominal gas pain     suspected -- may be related to intermittent right upper quadrant discomfort radiating around her back      . Breast mass     left breast  . GERD (gastroesophageal reflux disease)   . Cancer [facility])     left breast   Past Surgical History  Procedure Laterality Date  . Tonsillectomy and adenoidectomy  1950    at 81 years of age  . Breast reduction surgery    . Bladder repair    . Lipoma excision    . Appendectomy  1978  . Breast lumpectomy  02/24/2011    central partial mastectomy - dr Margot Chimes  . Abdominal hysterectomy      reports that she has never smoked. She has never used smokeless tobacco. She reports that she does not drink alcohol or use illicit drugs. family history includes Aortic aneurysm in her mother and sister; Cancer in her mother; Diabetes in her mother; Heart attack in her  mother; Hypertension in her mother; Pneumonia in her father; Stroke in her mother. Allergies  Allergen Reactions  . Bactrim Nausea And Vomiting  . Dilaudid [Hydromorphone Hcl]   . Hydrocodone Nausea And Vomiting  . Macrodantin Nausea And Vomiting      Review of Systems  Constitutional: Negative for fatigue.  Eyes: Negative for visual disturbance.  Respiratory: Negative for cough, chest tightness, shortness of breath and wheezing.   Cardiovascular: Negative for chest pain, palpitations and leg swelling.  Endocrine: Negative for polydipsia and polyuria.  Neurological: Negative for dizziness, seizures, syncope, weakness, light-headedness and headaches.       Objective:   Physical Exam  Constitutional: She appears well-developed and well-nourished.  Eyes: Pupils are equal, round, and reactive to light.  Neck: Neck supple. No JVD present. No thyromegaly present.  Cardiovascular: Normal rate and regular rhythm.  Exam reveals no gallop.   Pulmonary/Chest: Effort normal and breath sounds normal. No respiratory distress. She has no wheezes. She has no rales.  Musculoskeletal: She exhibits no edema.  Neurological: She is alert.  Psychiatric: She has a normal mood and affect. Her behavior is normal.          Assessment & Plan:  #1 type 2 diabetes. History of suboptimal control. We decided against further titration of Amaryl because of concerns of weight gain and concern of hypoglycemia. We  discussed other classes such as SGLT 2 inhibitors and GLP-1 inhibitors. We'll try Invokana 100 mg once daily- if she has coverage. We went over potential side effects. We decided against further titration of metformin since she's had diarrhea and intolerance at higher doses #2 hypertension stable and at goal #3 dyslipidemia. Recent lipids at goal. Continue Lipitor #4 hypothyroid- normal TSH in February.  Recheck at follow up.

## 2015-06-06 DIAGNOSIS — L821 Other seborrheic keratosis: Secondary | ICD-10-CM | POA: Diagnosis not present

## 2015-06-06 DIAGNOSIS — L812 Freckles: Secondary | ICD-10-CM | POA: Diagnosis not present

## 2015-06-06 DIAGNOSIS — L308 Other specified dermatitis: Secondary | ICD-10-CM | POA: Diagnosis not present

## 2015-06-06 DIAGNOSIS — D225 Melanocytic nevi of trunk: Secondary | ICD-10-CM | POA: Diagnosis not present

## 2015-07-02 ENCOUNTER — Encounter: Payer: Self-pay | Admitting: Family Medicine

## 2015-07-22 ENCOUNTER — Encounter: Payer: Self-pay | Admitting: Family Medicine

## 2015-07-23 NOTE — Telephone Encounter (Signed)
OK to take Invokana at night but could cause some nighttime urination. Dr Caffie Pinto is a podiatrist that Dr. Elease Hashimoto recommends. Let us know if you have any questions.  Thanks, Julie Norton,cma

## 2015-07-25 ENCOUNTER — Encounter: Payer: Self-pay | Admitting: Cardiology

## 2015-07-31 DIAGNOSIS — M79674 Pain in right toe(s): Secondary | ICD-10-CM | POA: Diagnosis not present

## 2015-07-31 DIAGNOSIS — B351 Tinea unguium: Secondary | ICD-10-CM | POA: Diagnosis not present

## 2015-07-31 DIAGNOSIS — B353 Tinea pedis: Secondary | ICD-10-CM | POA: Diagnosis not present

## 2015-07-31 DIAGNOSIS — H40013 Open angle with borderline findings, low risk, bilateral: Secondary | ICD-10-CM | POA: Diagnosis not present

## 2015-07-31 DIAGNOSIS — H5203 Hypermetropia, bilateral: Secondary | ICD-10-CM | POA: Diagnosis not present

## 2015-07-31 DIAGNOSIS — E119 Type 2 diabetes mellitus without complications: Secondary | ICD-10-CM | POA: Diagnosis not present

## 2015-07-31 DIAGNOSIS — L602 Onychogryphosis: Secondary | ICD-10-CM | POA: Diagnosis not present

## 2015-08-01 LAB — HM DIABETES EYE EXAM

## 2015-08-05 ENCOUNTER — Encounter: Payer: Self-pay | Admitting: Family Medicine

## 2015-08-05 ENCOUNTER — Other Ambulatory Visit: Payer: Self-pay

## 2015-08-05 MED ORDER — FLUCONAZOLE 150 MG PO TABS: 150.0000 mg | ORAL_TABLET | Freq: Once | ORAL | Status: DC

## 2015-08-05 NOTE — Telephone Encounter (Signed)
Per Mychart message - Fluconazole was sent in for patient. Patient notified.

## 2015-08-05 NOTE — Telephone Encounter (Signed)
Rx sent in to [facility] per patient request - patient notified & verbalized understanding.

## 2015-08-12 ENCOUNTER — Encounter: Payer: Self-pay | Admitting: Family Medicine

## 2015-08-21 DIAGNOSIS — L602 Onychogryphosis: Secondary | ICD-10-CM | POA: Diagnosis not present

## 2015-08-21 DIAGNOSIS — B353 Tinea pedis: Secondary | ICD-10-CM | POA: Diagnosis not present

## 2015-08-21 DIAGNOSIS — M79674 Pain in right toe(s): Secondary | ICD-10-CM | POA: Diagnosis not present

## 2015-08-21 DIAGNOSIS — E119 Type 2 diabetes mellitus without complications: Secondary | ICD-10-CM | POA: Diagnosis not present

## 2015-08-30 ENCOUNTER — Other Ambulatory Visit: Payer: Self-pay | Admitting: Family Medicine

## 2015-09-03 ENCOUNTER — Other Ambulatory Visit: Payer: Self-pay | Admitting: Family Medicine

## 2015-09-03 ENCOUNTER — Telehealth: Payer: Self-pay | Admitting: Family Medicine

## 2015-09-03 MED ORDER — FLUCONAZOLE 150 MG PO TABS: 150.0000 mg | ORAL_TABLET | Freq: Once | ORAL | Status: DC

## 2015-09-03 NOTE — Telephone Encounter (Signed)
Pt request refill of the following: fluconazole (DIFLUCAN) 150 MG tablet   Phamacy: [facility]

## 2015-09-03 NOTE — Telephone Encounter (Signed)
Pending scheduled Refill sent

## 2015-09-03 NOTE — Telephone Encounter (Signed)
Last filled 08/05/2015 Has not been seen since 05/2015 Please advise if this is something that we RX for this pt on a regular basis

## 2015-09-03 NOTE — Telephone Encounter (Signed)
She does not take this regularly.  May refill once.  She needs follow up regarding diabetes within the next month.

## 2015-09-08 ENCOUNTER — Other Ambulatory Visit: Payer: Self-pay | Admitting: Family Medicine

## 2015-09-20 ENCOUNTER — Telehealth: Payer: Self-pay | Admitting: Family Medicine

## 2015-09-20 NOTE — Telephone Encounter (Signed)
Patient has appt scheduled 3/20 with Dr. Elease Hashimoto - [facility]

## 2015-09-20 NOTE — Telephone Encounter (Signed)
[facility] - Client [facility]  Patient Name: Julie Norton  DOB: April 04, 1930    Initial Comment Caller states, started new Rx for diabetics, she has rash on leg, from ankle to knee.    Nurse Assessment  Nurse: Wynetta Emery, RN, Baker Janus Date/Time Eilene Ghazi Time): 09/20/2015 10:22:07 AM  Confirm and document reason for call. If symptomatic, describe symptoms. You must click the next button to save text entered. ---Dalene Seltzer started a rash on leg from knee to ankle red and not sure if it is the medication diabetes (too expensive for her and has concerns about this) new medication prescribed can't remember name  Has the patient traveled out of the country within the last 30 days? ---No  Does the patient have any new or worsening symptoms? ---Yes  Will a triage be completed? ---Yes  Related visit to physician within the last 2 weeks? ---No  Does the PT have any chronic conditions? (i.e. diabetes, asthma, etc.) ---Yes  List chronic conditions. ---Diabetes  Is this a behavioral health or substance abuse call? ---No     Guidelines    Guideline Title Affirmed Question Affirmed Notes  Rash or Redness - Localized Mild localized rash (all triage questions negative)    Final Disposition User   See Physician within [place], RN, Aissatou Updike given an appt on Monday 09/23/2015 1030am arrival 1015am Dr. Elease Hashimoto   Referrals  REFERRED TO PCP OFFICE   Disagree/Comply: Comply

## 2015-09-23 ENCOUNTER — Ambulatory Visit (INDEPENDENT_AMBULATORY_CARE_PROVIDER_SITE_OTHER): Payer: Medicare Other | Admitting: Family Medicine

## 2015-09-23 VITALS — BP 112/70 | HR 78 | Temp 97.9°F | Ht 62.0 in | Wt 150.4 lb

## 2015-09-23 DIAGNOSIS — L309 Dermatitis, unspecified: Secondary | ICD-10-CM

## 2015-09-23 DIAGNOSIS — E1165 Type 2 diabetes mellitus with hyperglycemia: Secondary | ICD-10-CM

## 2015-09-23 DIAGNOSIS — E038 Other specified hypothyroidism: Secondary | ICD-10-CM

## 2015-09-23 DIAGNOSIS — E1140 Type 2 diabetes mellitus with diabetic neuropathy, unspecified: Secondary | ICD-10-CM

## 2015-09-23 MED ORDER — TRIAMCINOLONE ACETONIDE 0.1 % EX CREA
1.0000 | TOPICAL_CREAM | Freq: Two times a day (BID) | CUTANEOUS | Status: DC | PRN
Start: 2015-09-23 — End: 2019-03-21

## 2015-09-23 NOTE — Progress Notes (Signed)
Subjective:    Patient ID: Julie Norton, female    DOB: 03-02-1930, 82 y.o.   MRN: [id number]  HPI  patient here for several items as below   New issue of pruritic skin rash mostly right lower leg and lesser extent left lower leg. Present for several weeks. She tried moisturizers without improvement.  Moderate pruritus. No pain. No fevers or chills. No clear exacerbating factors. She thinks she had similar rash in the past treated by dermatology with some type of prescription cream and eventually this resolved.   Type 2 diabetes. History of poor control. We added invokana couple months ago and she is scheduled next week to get referral of A1c. Fasting blood sugar still ranging around 150. Occasional polyuria.  no polydipsia.  patient will like to see certified diabetes educator. She's not had any formal education in quite some time.   Hypothyroidism. On levothyroxine. Compliant with therapy. Overdue for recheck.  Past Medical History  Diagnosis Date  . H/O: hysterectomy 1978  . Hypercholesteremia   . Hypothyroidism   . Dyspepsia   . Chest pain     intermittent right upper quadrant discomfort radiating around her back      . Tendinitis     of the right arm and shoulder  . Abdominal gas pain     suspected -- may be related to intermittent right upper quadrant discomfort radiating around her back      . Breast mass     left breast  . GERD (gastroesophageal reflux disease)   . Cancer [facility])     left breast   Past Surgical History  Procedure Laterality Date  . Tonsillectomy and adenoidectomy  1950    at 82 years of age  . Breast reduction surgery    . Bladder repair    . Lipoma excision    . Appendectomy  1978  . Breast lumpectomy  02/24/2011    central partial mastectomy - dr Margot Chimes  . Abdominal hysterectomy      reports that she has never smoked. She has never used smokeless tobacco. She reports that she does not drink alcohol or use illicit drugs. family history  includes Aortic aneurysm in her mother and sister; Cancer in her mother; Diabetes in her mother; Heart attack in her mother; Hypertension in her mother; Pneumonia in her father; Stroke in her mother. Allergies  Allergen Reactions  . Bactrim Nausea And Vomiting  . Dilaudid [Hydromorphone Hcl]   . Hydrocodone Nausea And Vomiting  . Macrodantin Nausea And Vomiting      Review of Systems  Constitutional: Negative for fatigue.  Eyes: Negative for visual disturbance.  Respiratory: Negative for cough, chest tightness, shortness of breath and wheezing.   Cardiovascular: Negative for chest pain, palpitations and leg swelling.  Endocrine: Negative for polydipsia and polyuria.  Genitourinary: Negative for dysuria.  Skin: Positive for rash.  Neurological: Negative for dizziness, seizures, syncope, weakness, light-headedness and headaches.       Objective:   Physical Exam  Constitutional: She appears well-developed and well-nourished.  Neck: Neck supple. No thyromegaly present.  Cardiovascular: Normal rate and regular rhythm.   Pulmonary/Chest: Effort normal and breath sounds normal. No respiratory distress. She has no wheezes. She has no rales.  Neurological: She is alert.  Skin: Rash noted.  Patient has slightly erythematous scaly somewhat irregular rash on her right lower leg with lesser extent involvement of left lower leg. Rash is mostly anterior. No pustules. No vesicles. Nontender  Assessment & Plan:   #1 eczematous skin rash right lower leg. Triamcinolone 0.1% cream twice daily as needed. Avoid scratching as much as possible.   Reassess at follow-up in 2 weeks   #2 hypothyroidism. Ordered future lab for repeat TSH in one week   #3 type 2 diabetes with history of poor control neuropathy. Recheck A1c at follow-up next week. Set up further education with certified diabetes educator

## 2015-09-23 NOTE — Progress Notes (Signed)
Pre visit review using our clinic review tool, if applicable. No additional management support is needed unless otherwise documented below in the visit note. 

## 2015-09-30 ENCOUNTER — Other Ambulatory Visit (INDEPENDENT_AMBULATORY_CARE_PROVIDER_SITE_OTHER): Payer: Medicare Other

## 2015-09-30 DIAGNOSIS — E038 Other specified hypothyroidism: Secondary | ICD-10-CM | POA: Diagnosis not present

## 2015-09-30 DIAGNOSIS — E1140 Type 2 diabetes mellitus with diabetic neuropathy, unspecified: Secondary | ICD-10-CM | POA: Diagnosis not present

## 2015-09-30 DIAGNOSIS — E1165 Type 2 diabetes mellitus with hyperglycemia: Secondary | ICD-10-CM | POA: Diagnosis not present

## 2015-09-30 LAB — HEMOGLOBIN A1C: Hgb A1c MFr Bld: 7.6 % — ABNORMAL HIGH (ref 4.6–6.5)

## 2015-09-30 LAB — TSH: TSH: 2.1 u[IU]/mL (ref 0.35–4.50)

## 2015-10-07 ENCOUNTER — Encounter: Payer: Self-pay | Admitting: Family Medicine

## 2015-10-07 ENCOUNTER — Ambulatory Visit (INDEPENDENT_AMBULATORY_CARE_PROVIDER_SITE_OTHER): Payer: Medicare Other | Admitting: Family Medicine

## 2015-10-07 VITALS — BP 134/70 | HR 72 | Temp 97.5°F | Ht 62.0 in | Wt 149.7 lb

## 2015-10-07 DIAGNOSIS — E1165 Type 2 diabetes mellitus with hyperglycemia: Secondary | ICD-10-CM

## 2015-10-07 DIAGNOSIS — B373 Candidiasis of vulva and vagina: Secondary | ICD-10-CM | POA: Diagnosis not present

## 2015-10-07 DIAGNOSIS — E038 Other specified hypothyroidism: Secondary | ICD-10-CM | POA: Diagnosis not present

## 2015-10-07 DIAGNOSIS — E1140 Type 2 diabetes mellitus with diabetic neuropathy, unspecified: Secondary | ICD-10-CM | POA: Diagnosis not present

## 2015-10-07 DIAGNOSIS — L853 Xerosis cutis: Secondary | ICD-10-CM

## 2015-10-07 DIAGNOSIS — B3731 Acute candidiasis of vulva and vagina: Secondary | ICD-10-CM

## 2015-10-07 MED ORDER — FLUCONAZOLE 100 MG PO TABS: 100.0000 mg | ORAL_TABLET | Freq: Every day | ORAL | Status: DC

## 2015-10-07 NOTE — Patient Instructions (Signed)
Consider decrease Amaryl to one half tablet a day- IF low blood sugars continue to occur HOLD the Lipitor for one week while taking the Fluconazole.

## 2015-10-07 NOTE — Progress Notes (Signed)
Pre visit review using our clinic review tool, if applicable. No additional management support is needed unless otherwise documented below in the visit note. 

## 2015-10-07 NOTE — Progress Notes (Signed)
Subjective:    Patient ID: Julie Norton, female    DOB: [birth year], 82 y.o.   MRN: [id number]  HPI Patient seen for medical follow-up  Type 2 diabetes. Most recent A1c 7.6%. Currently takes regimen of metformin, Amaryl, and invokana. Unfortunately, she has had some recurrent yeast infections in terms of yeast vaginitis. Currently has same symptoms. Previously has cleared up with one tablet of fluconazole. She is aware this could be related to invokana.  Hypothyroidism. Recent TSH at goal. Compliant with therapy  She's had some dry skin rash involving her left upper back and left trunk region. Using over-the-counter moisturizer. Occasionally itches and occasionally painful. She was concerned because of prior history of shingles. No recent vesicular rash.  Past Medical History  Diagnosis Date  . H/O: hysterectomy 1978  . Hypercholesteremia   . Hypothyroidism   . Dyspepsia   . Chest pain     intermittent right upper quadrant discomfort radiating around her back      . Tendinitis     of the right arm and shoulder  . Abdominal gas pain     suspected -- may be related to intermittent right upper quadrant discomfort radiating around her back      . Breast mass     left breast  . GERD (gastroesophageal reflux disease)   . Cancer [facility])     left breast   Past Surgical History  Procedure Laterality Date  . Tonsillectomy and adenoidectomy  1950    at 82 years of age  . Breast reduction surgery    . Bladder repair    . Lipoma excision    . Appendectomy  1978  . Breast lumpectomy  02/24/2011    central partial mastectomy - dr Margot Chimes  . Abdominal hysterectomy      reports that she has never smoked. She has never used smokeless tobacco. She reports that she does not drink alcohol or use illicit drugs. family history includes Aortic aneurysm in her mother and sister; Cancer in her mother; Diabetes in her mother; Heart attack in her mother; Hypertension in her mother; Pneumonia in her  father; Stroke in her mother. Allergies  Allergen Reactions  . Bactrim Nausea And Vomiting  . Dilaudid [Hydromorphone Hcl]   . Hydrocodone Nausea And Vomiting  . Macrodantin Nausea And Vomiting      Review of Systems  Constitutional: Negative for appetite change, fatigue and unexpected weight change.  Eyes: Negative for visual disturbance.  Respiratory: Negative for cough, chest tightness, shortness of breath and wheezing.   Cardiovascular: Negative for chest pain, palpitations and leg swelling.  Endocrine: Negative for polydipsia and polyuria.  Skin: Positive for rash.       She has fairly nonspecific dry skin involving much of her trunk region. She has some scaling. No vesicles.  Neurological: Negative for dizziness, seizures, syncope, weakness, light-headedness and headaches.       Objective:   Physical Exam  Constitutional: She appears well-developed and well-nourished.  Neck: Neck supple. No thyromegaly present.  Cardiovascular: Normal rate and regular rhythm.   Pulmonary/Chest: Effort normal and breath sounds normal. No respiratory distress. She has no wheezes. She has no rales.  Musculoskeletal: She exhibits no edema.  Skin:  Generally dry skin. She has nonspecific scaling involving various areas of her trunk. No vesicles. No pustules.          Assessment & Plan:  #1 type 2 diabetes. Improving control. Continue current regimen. Reassess in 4 months  #2  history of recurrent yeast vaginitis. Possibly related to invokana. Elected to treat with one-week course of fluconazole 100 mg once daily. If she has short-term recurrence of yeast vaginitis following this we'll discontinue invokana and look at other options such as low-dose insulin once daily  #3 dry skin. We've recommended continued use some moisturizers, especially after bathing  #4 hypothyroidism. At goal. Continue current dose of levothyroxine

## 2015-10-17 ENCOUNTER — Encounter: Payer: Medicare Other | Attending: Family Medicine | Admitting: Dietician

## 2015-10-17 ENCOUNTER — Encounter: Payer: Self-pay | Admitting: Dietician

## 2015-10-17 ENCOUNTER — Other Ambulatory Visit: Payer: Self-pay | Admitting: Family Medicine

## 2015-10-17 VITALS — Ht 62.0 in | Wt 149.0 lb

## 2015-10-17 DIAGNOSIS — E1140 Type 2 diabetes mellitus with diabetic neuropathy, unspecified: Secondary | ICD-10-CM | POA: Insufficient documentation

## 2015-10-17 DIAGNOSIS — E1165 Type 2 diabetes mellitus with hyperglycemia: Secondary | ICD-10-CM | POA: Diagnosis not present

## 2015-10-17 NOTE — Patient Instructions (Signed)
Plan:  Aim for 2-3 Carb Choices per meal (30-45 grams) +/- 1 either way  Aim for 0-1 Carbs per snack if hungry  Include protein in moderation with your meals and snacks Consider reading food labels for Total Carbohydrate and Fat Grams of foods Consider  increasing your activity level by walking for 30 minutes daily as tolerated Consider checking BG at alternate times per day as directed by MD  Consider taking medication as directed by MD  Consider Calorie [employer] App to help make healthy decisions when eating out.

## 2015-10-17 NOTE — Progress Notes (Signed)
  Medical Nutrition Therapy:  Appt start time: 1400 end time:  [id number].   Assessment:  Primary concerns today: Patient is here with her husband to improve her blood sugar control along with issues of GERD.  Hx includes Type 2 Diabetes (2012), ductal breast cancer (5 years out), GERD, and hypothyroidism.  Her last HgbA1C was 7.6% 09/30/15 decreased from 8.0% 05/23/15.  Her blood sugar is highest in the amd -170 and less before lunch 75-114.  She reports feeling low at times but it is in the 70's and is near meal time.    Patient lives with her husband.  They share shopping and cooking.  They eat out less than they did in the past.   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  Preferred Learning Style:   No preference indicated   Learning Readiness:   Ready  Change in progress  MEDICATIONS: Invokana, glimepiride, metformin   DIETARY INTAKE:  24-hr recall:  B ( AM): 2 slices cranberry toast with sunflower seeds with whipped cream cheese OR [employer] OR [employer] Snk ( AM): egg with or without toast  L ( PM): raw veges with dip, chicken OR Jam's deli sandwich Snk ( PM): raw vegeges with dip OR trail mix D ( PM): grilled chicken or fish or pork, salad or broccoli salad, rinced canned vegetables and occasional pasta salad Snk ( PM): Hungry in the middle of the night while on Invokana Beverages: water, black coffee or with the sugar free irish creamer or half and half  Usual physical activity: (back and arm problems) Walks 3-4 days per week for 20-25 minutes each times.  Estimated energy needs: 1400 calories 158 g carbohydrates 105 g protein 39 g fat  Progress Towards Goal(s):  In progress.   Nutritional Diagnosis:  NB-1.1 Food and nutrition-related knowledge deficit As related to balance of carbohydrate, protein, and fat.  As evidenced by diet hx.    Intervention:  Nutrition counseling and diabetes education initiated. Discussed Carb Counting by food group as method of  portion control, reading food labels, and benefits of increased activity. Also discussed basic physiology of Diabetes, target BG ranges pre and post meals, and A1c.   Plan:  Aim for 2-3 Carb Choices per meal (30-45 grams) +/- 1 either way  Aim for 0-1 Carbs per snack if hungry  Include protein in moderation with your meals and snacks Consider reading food labels for Total Carbohydrate and Fat Grams of foods Consider  increasing your activity level by walking for 30 minutes daily as tolerated Consider checking BG at alternate times per day as directed by MD  Consider taking medication as directed by MD  Consider Calorie [employer] [facility] App to help make healthy decisions w  Teaching Method Utilized:  Visual Auditory Hands on  Handouts given during visit include:  Meal plan card  Label reading  Snack list  My plate  Barriers to learning/adherence to lifestyle change: none  Demonstrated degree of understanding via:  Teach Back   Monitoring/Evaluation:  Dietary intake, exercise, label reading, and body weight prn.

## 2015-10-21 ENCOUNTER — Other Ambulatory Visit: Payer: Self-pay

## 2015-10-30 ENCOUNTER — Other Ambulatory Visit: Payer: Self-pay | Admitting: Family Medicine

## 2015-11-26 DIAGNOSIS — M25511 Pain in right shoulder: Secondary | ICD-10-CM | POA: Diagnosis not present

## 2015-11-26 DIAGNOSIS — M7541 Impingement syndrome of right shoulder: Secondary | ICD-10-CM | POA: Diagnosis not present

## 2015-12-05 ENCOUNTER — Encounter: Payer: Self-pay | Admitting: Family Medicine

## 2015-12-05 NOTE — Telephone Encounter (Signed)
Please advise--recently seen on 10/07/2015

## 2015-12-06 ENCOUNTER — Other Ambulatory Visit: Payer: Self-pay | Admitting: Family Medicine

## 2015-12-06 ENCOUNTER — Other Ambulatory Visit (INDEPENDENT_AMBULATORY_CARE_PROVIDER_SITE_OTHER): Payer: Medicare Other

## 2015-12-06 DIAGNOSIS — R350 Frequency of micturition: Secondary | ICD-10-CM

## 2015-12-06 DIAGNOSIS — M545 Low back pain: Secondary | ICD-10-CM

## 2015-12-06 LAB — POC URINALSYSI DIPSTICK (AUTOMATED)
BILIRUBIN UA: NEGATIVE
Blood, UA: NEGATIVE
LEUKOCYTES UA: NEGATIVE
Nitrite, UA: POSITIVE
Spec Grav, UA: >=1.03
Urobilinogen, UA: 1
pH, UA: 5.5

## 2015-12-22 ENCOUNTER — Other Ambulatory Visit: Payer: Self-pay | Admitting: Family Medicine

## 2016-01-08 ENCOUNTER — Other Ambulatory Visit: Payer: Self-pay | Admitting: Obstetrics & Gynecology

## 2016-01-08 DIAGNOSIS — C50912 Malignant neoplasm of unspecified site of left female breast: Secondary | ICD-10-CM

## 2016-01-08 DIAGNOSIS — N644 Mastodynia: Secondary | ICD-10-CM

## 2016-01-28 ENCOUNTER — Telehealth: Payer: Self-pay | Admitting: Cardiology

## 2016-01-28 NOTE — Telephone Encounter (Signed)
Attempt to return call-no answer-LMTCB.

## 2016-01-28 NOTE — Telephone Encounter (Signed)
Spoke to pt-pt requesting to have lab work ordered prior to seeing MD Martinique on 8/1.  Former pt of MD [employer].  Will route to MD for orders.   Pt requesting to have lab work at [facility] at [place] if ordered.

## 2016-01-28 NOTE — Telephone Encounter (Signed)
New message   Pt requested an order for BP. Please call.

## 2016-01-28 NOTE — Telephone Encounter (Signed)
F/u Message ° °pt returning RN call. Please call back to discuss °

## 2016-02-02 NOTE — Telephone Encounter (Signed)
I would check CBC, CMET, lipids, and A1c  [place]ot[place] Dost Martinique MD, [facility]

## 2016-02-03 NOTE — Telephone Encounter (Signed)
Spoke to patient-pt has appt in the AM to see MD Martinique.  Pt advised to not eat or drink tomorrow morning and will be able to get lab work done tomorrow after appt.  Unable to get done today-not fasting.  Pt verbalized understanding.

## 2016-02-03 NOTE — Progress Notes (Signed)
Cardiology Office Note   Date:  02/04/2016   ID:  Julie Norton, DOB 1929-09-11, MRN [id number]  PCP:  Eulas Post, MD  Cardiologist: Peter Martinique MD  Chief Complaint  Patient presents with  . Hyperlipidemia      History of Present Illness: Julie Norton is a 82 y.o. female who presents for follow up HLD and RBBB. She is a former patient of Dr. Mare Ferrari.   She has a past history of hypercholesterolemia. She also has a history of left breast cancer. She finished radiation therapy in November 2012.  She has a history  of some right upper quadrant discomfort. A gallbladder ultrasound on 11/16/11  was negative for cholelithiasis.  She notes some fullness in the epigastric area when she lies down. History of hiatal hernia. CT in 2014 showed no aneurysm. She had a normal Myoview study in November 2017.   Past Medical History:  Diagnosis Date  . Abdominal gas pain    suspected -- may be related to intermittent right upper quadrant discomfort radiating around her back      . Breast mass    left breast  . Cancer ([place])    left breast  . Chest pain    intermittent right upper quadrant discomfort radiating around her back      . Diabetes mellitus without complication ([place])   . Dyspepsia   . GERD (gastroesophageal reflux disease)   . H/O: hysterectomy 1978  . Hypercholesteremia   . Hypothyroidism   . Tendinitis    of the right arm and shoulder    Past Surgical History:  Procedure Laterality Date  . ABDOMINAL HYSTERECTOMY    . APPENDECTOMY  1978  . BLADDER REPAIR    . BREAST LUMPECTOMY  02/24/2011   central partial mastectomy - dr Margot Chimes  . BREAST REDUCTION SURGERY    . LIPOMA EXCISION    . TONSILLECTOMY AND ADENOIDECTOMY  1950   at 82 years of age     Current Outpatient Prescriptions  Medication Sig Dispense Refill  . atorvastatin (LIPITOR) 10 MG tablet Take one-half tablet by  mouth every other day or as directed 45 tablet 3  . Calcium Carbonate-Vitamin D  (CALCIUM 600+D) 600-200 MG-UNIT TABS Take 2 tablets by mouth daily.     . Cholecalciferol (VITAMIN D PO) Take 1 tablet by mouth daily.     Marland Kitchen glimepiride (AMARYL) 2 MG tablet Take 1 tablet by mouth  daily before breakfast 90 tablet 1  . glucosamine-chondroitin 500-400 MG tablet Take 2 tablets by mouth daily.     Marland Kitchen glucose blood (ONE TOUCH ULTRA TEST) test strip Check once daily. E11.40 100 each 11  . levothyroxine (SYNTHROID, LEVOTHROID) 50 MCG tablet Take 1 tablet by mouth  daily 90 tablet 3  . metFORMIN (GLUCOPHAGE-XR) 500 MG 24 hr tablet Take 1 tablet by mouth  twice a day 180 tablet 1  . omeprazole (PRILOSEC) 20 MG capsule Take 1 capsule by mouth two times daily 180 capsule 1  . ONE TOUCH ULTRA TEST test strip CHECK ONCE A DAY 50 each 3  . triamcinolone cream (KENALOG) 0.1 % Apply 1 application topically 2 (two) times daily as needed. 30 g 2   Current Facility-Administered Medications  Medication Dose Route Frequency Provider Last Rate Last Dose  . aspirin chewable tablet 81 mg  81 mg Oral Once Peter M Martinique, MD        Allergies:   Bactrim; Dilaudid [hydromorphone hcl]; Hydrocodone; Invokana [canagliflozin]; and  Macrodantin    Social History:  The patient  reports that she has never smoked. She has never used smokeless tobacco. She reports that she does not drink alcohol or use drugs.   Family History:  The patient's family history includes Aortic aneurysm in her mother and sister; Cancer in her mother; Diabetes in her mother; Heart attack in her mother; Hypertension in her mother; Pneumonia in her father; Stroke in her mother.    ROS:  Please see the history of present illness.   Otherwise, review of systems are positive for none.   All other systems are reviewed and negative.    PHYSICAL EXAM: VS:  BP 126/78 (BP Location: Right Arm, Patient Position: Sitting, Cuff Size: Normal)   Pulse 80   Ht 5\' 2"  (1.575 m)   Wt 144 lb 3.2 oz (65.4 kg)   BMI 26.37 kg/m  , BMI Body mass index  is 26.37 kg/m. GEN: Well nourished, well developed, in no acute distress  HEENT: normal  Neck: no JVD, carotid bruits, or masses Cardiac: RRR; normal S1-2. Soft systolic murmur LSB.  Respiratory:  clear to auscultation bilaterally, normal work of breathing GI: soft, nontender, nondistended, + BS MS: no deformity or atrophy  Skin: warm and dry, no rash Neuro:  Strength and sensation are intact Psych: euthymic mood, full affect   EKG:  EKG is not ordered today.    Recent Labs: 05/23/2015: ALT 16; BUN 14; Creatinine, Ser 0.71; Potassium 4.2; Sodium 141 09/30/2015: TSH 2.10    Lipid Panel    Component Value Date/Time   CHOL 146 05/23/2015 0802   TRIG 135.0 05/23/2015 0802   HDL 43.90 05/23/2015 0802   CHOLHDL 3 05/23/2015 0802   VLDL 27.0 05/23/2015 0802   LDLCALC 75 05/23/2015 0802      Wt Readings from Last 3 Encounters:  02/04/16 144 lb 3.2 oz (65.4 kg)  10/17/15 149 lb (67.6 kg)  10/07/15 149 lb 11.2 oz (67.9 kg)      Lexiscan myoview 05/26/16:Study Highlights    Nuclear stress EF: 72%.  There was no ST segment deviation noted during stress.  This is a low risk study.  The left ventricular ejection fraction is hyperdynamic (>65%).   1. Small, mild mid-anteroseptal perfusion defect.  This defect was actually more intense at rest than with stress.  No ischemia.  Suspect attenuation.  2. EF 72% with normal wall motion.  3. Overall low risk study.        ASSESSMENT AND PLAN:  1. Heart murmur, with echocardiogram done in July 2015.The echo is satisfactory. The LV shows good function EF 60-65%. There is mild mitral regurgitation accounting for the murmur.  2. Hypercholesterolemia- will check fasting lab work today. 3. History of breast cancer of left breast treated with lumpectomy and radiation therapy 4. Diabetes mellitus- per primary care.  5. Hypothyroidism, followed by Dr. Elease Hashimoto   Current medicines are reviewed at length with the patient  today.  The patient does not have concerns regarding medicines.  The following changes have been made:  Reduce ASA to 81 mg daily.   Labs/ tests ordered today include:   CMET, Lipids, A1c  Follow up in 6 months.  Signed, Peter Martinique MD, [facility]   02/04/2016 8:53 AM    [place]

## 2016-02-04 ENCOUNTER — Ambulatory Visit (INDEPENDENT_AMBULATORY_CARE_PROVIDER_SITE_OTHER): Payer: Medicare Other | Admitting: Cardiology

## 2016-02-04 ENCOUNTER — Encounter: Payer: Self-pay | Admitting: Cardiology

## 2016-02-04 ENCOUNTER — Other Ambulatory Visit: Payer: Self-pay

## 2016-02-04 VITALS — BP 126/78 | HR 80 | Ht 62.0 in | Wt 144.2 lb

## 2016-02-04 DIAGNOSIS — E039 Hypothyroidism, unspecified: Secondary | ICD-10-CM

## 2016-02-04 DIAGNOSIS — I4510 Unspecified right bundle-branch block: Secondary | ICD-10-CM | POA: Diagnosis not present

## 2016-02-04 DIAGNOSIS — E7800 Pure hypercholesterolemia, unspecified: Secondary | ICD-10-CM

## 2016-02-04 DIAGNOSIS — E119 Type 2 diabetes mellitus without complications: Secondary | ICD-10-CM

## 2016-02-04 DIAGNOSIS — I340 Nonrheumatic mitral (valve) insufficiency: Secondary | ICD-10-CM

## 2016-02-04 LAB — HEMOGLOBIN A1C
Hgb A1c MFr Bld: 7.3 % — ABNORMAL HIGH (ref <5.7)
Mean Plasma Glucose: 163 mg/dL

## 2016-02-04 LAB — HEPATIC FUNCTION PANEL
ALK PHOS: 59 U/L (ref 33–130)
ALT: 12 U/L (ref 6–29)
AST: 21 U/L (ref 10–35)
Albumin: 4.3 g/dL (ref 3.6–5.1)
BILIRUBIN DIRECT: 0.1 mg/dL (ref <=0.2)
BILIRUBIN TOTAL: 0.8 mg/dL (ref 0.2–1.2)
Indirect Bilirubin: 0.7 mg/dL (ref 0.2–1.2)
Total Protein: 6.7 g/dL (ref 6.1–8.1)

## 2016-02-04 LAB — LIPID PANEL
Cholesterol: 162 mg/dL (ref 125–200)
HDL: 58 mg/dL (ref >=46)
LDL Cholesterol: 73 mg/dL (ref <130)
Total CHOL/HDL Ratio: 2.8 Ratio (ref <=5.0)
Triglycerides: 153 mg/dL — ABNORMAL HIGH (ref <150)
VLDL: 31 mg/dL — ABNORMAL HIGH (ref <30)

## 2016-02-04 LAB — BASIC METABOLIC PANEL
BUN: 8 mg/dL (ref 7–25)
CALCIUM: 9.6 mg/dL (ref 8.6–10.4)
CO2: 25 mmol/L (ref 20–31)
CREATININE: 0.8 mg/dL (ref 0.60–0.88)
Chloride: 106 mmol/L (ref 98–110)
GLUCOSE: 119 mg/dL — AB (ref 65–99)
Potassium: 4.4 mmol/L (ref 3.5–5.3)
Sodium: 141 mmol/L (ref 135–146)

## 2016-02-04 LAB — TSH: TSH: 1.62 m[IU]/L

## 2016-02-04 MED ORDER — ASPIRIN 81 MG PO CHEW: 81.0000 mg | CHEWABLE_TABLET | Freq: Once | ORAL | Status: DC

## 2016-02-04 NOTE — Patient Instructions (Signed)
Reduce ASA to 81 mg daily  We will check blood work today  I will see you in 6 months.

## 2016-02-04 NOTE — Addendum Note (Signed)
Addended by: Kathyrn Lass on: 02/04/2016 09:13 AM   Modules accepted: Orders

## 2016-02-05 ENCOUNTER — Encounter: Payer: Self-pay | Admitting: Family Medicine

## 2016-02-10 DIAGNOSIS — Z01419 Encounter for gynecological examination (general) (routine) without abnormal findings: Secondary | ICD-10-CM | POA: Diagnosis not present

## 2016-02-10 DIAGNOSIS — Z1272 Encounter for screening for malignant neoplasm of vagina: Secondary | ICD-10-CM | POA: Diagnosis not present

## 2016-02-10 DIAGNOSIS — Z6827 Body mass index (BMI) 27.0-27.9, adult: Secondary | ICD-10-CM | POA: Diagnosis not present

## 2016-02-14 ENCOUNTER — Ambulatory Visit
Admission: RE | Admit: 2016-02-14 | Discharge: 2016-02-14 | Disposition: A | Discharge status: Home / Self Care | Payer: Medicare Other | Source: Ambulatory Visit | Attending: Obstetrics & Gynecology | Admitting: Obstetrics & Gynecology

## 2016-02-14 DIAGNOSIS — N644 Mastodynia: Secondary | ICD-10-CM

## 2016-02-14 DIAGNOSIS — C50912 Malignant neoplasm of unspecified site of left female breast: Secondary | ICD-10-CM

## 2016-02-14 DIAGNOSIS — R921 Mammographic calcification found on diagnostic imaging of breast: Secondary | ICD-10-CM | POA: Diagnosis not present

## 2016-03-03 ENCOUNTER — Encounter: Payer: Self-pay | Admitting: Family Medicine

## 2016-03-03 ENCOUNTER — Ambulatory Visit (INDEPENDENT_AMBULATORY_CARE_PROVIDER_SITE_OTHER): Payer: Medicare Other | Admitting: Family Medicine

## 2016-03-03 VITALS — BP 100/80 | HR 75 | Temp 97.7°F | Ht 62.0 in | Wt 145.7 lb

## 2016-03-03 DIAGNOSIS — Z23 Encounter for immunization: Secondary | ICD-10-CM | POA: Diagnosis not present

## 2016-03-03 DIAGNOSIS — Z0000 Encounter for general adult medical examination without abnormal findings: Secondary | ICD-10-CM

## 2016-03-03 NOTE — Patient Instructions (Signed)
Try reducing the Omeprazole to once daily and use Zantac or Pepcid OTC in place of the Omeprazole. Remember flu vaccine later this Fall. You received Prevnar 13 today.

## 2016-03-03 NOTE — Progress Notes (Signed)
Pre visit review using our clinic review tool, if applicable. No additional management support is needed unless otherwise documented below in the visit note. 

## 2016-03-03 NOTE — Progress Notes (Signed)
Subjective:     Patient ID: Julie Norton, female   DOB: 1930-04-21, 82 y.o.   MRN: [id number]  HPI Patient here for physical exam. She is followed still by GYN and also closely by cardiology and oncology. History of breast cancer. Other chronic problems include history of hypothyroidism, osteoarthritis, hyperlipidemia  Her diabetes has been somewhat poorly controlled for some time but is gradually improving. Recent A1c 7.3%. No hypoglycemia. Feels well overall.  Recent labs with cardiology reviewed. Lipids stable. Thyroid normal range  Long history of GERD. Currently on omeprazole 20 mg twice a day and symptoms stable. She's not tried substitution with H2 blocker.  Immunizations reviewed. Needs Prevnar 13. She plans to get flu vaccine later this year. She has already had mammogram this year. She's gotten bone density scans through her GYN  Past Medical History:  Diagnosis Date  . Abdominal gas pain    suspected -- may be related to intermittent right upper quadrant discomfort radiating around her back      . Breast mass    left breast  . Cancer ([place])    left breast  . Chest pain    intermittent right upper quadrant discomfort radiating around her back      . Diabetes mellitus without complication ([place])   . Dyspepsia   . GERD (gastroesophageal reflux disease)   . H/O: hysterectomy 1978  . Hypercholesteremia   . Hypothyroidism   . Tendinitis    of the right arm and shoulder   Past Surgical History:  Procedure Laterality Date  . ABDOMINAL HYSTERECTOMY    . APPENDECTOMY  1978  . BLADDER REPAIR    . BREAST LUMPECTOMY  02/24/2011   central partial mastectomy - dr Margot Chimes  . BREAST REDUCTION SURGERY    . LIPOMA EXCISION    . TONSILLECTOMY AND ADENOIDECTOMY  1950   at 82 years of age    reports that she has never smoked. She has never used smokeless tobacco. She reports that she does not drink alcohol or use drugs. family history includes Aortic aneurysm in her mother and  sister; Cancer in her mother; Diabetes in her mother; Heart attack in her mother; Hypertension in her mother; Pneumonia in her father; Stroke in her mother. Allergies  Allergen Reactions  . Bactrim Nausea And Vomiting  . Dilaudid [Hydromorphone Hcl]   . Hydrocodone Nausea And Vomiting  . Invokana [Canagliflozin] Itching and Other (See Comments)    Yeast Infections   . Macrodantin Nausea And Vomiting     Review of Systems  Constitutional: Negative for activity change, appetite change, fatigue, fever and unexpected weight change.  HENT: Negative for ear pain, hearing loss, sore throat and trouble swallowing.   Eyes: Negative for visual disturbance.  Respiratory: Negative for cough and shortness of breath.   Cardiovascular: Negative for chest pain and palpitations.  Gastrointestinal: Negative for abdominal pain, blood in stool, constipation and diarrhea.  Endocrine: Negative for polydipsia and polyuria.  Genitourinary: Negative for dysuria and hematuria.  Musculoskeletal: Positive for arthralgias. Negative for back pain and myalgias.  Skin: Negative for rash.  Neurological: Negative for dizziness, syncope and headaches.  Hematological: Negative for adenopathy.  Psychiatric/Behavioral: Negative for confusion and dysphoric mood.       Objective:   Physical Exam  Constitutional: She is oriented to person, place, and time. She appears well-developed and well-nourished. No distress.  HENT:  Right Ear: External ear normal.  Left Ear: External ear normal.  Mouth/Throat: Oropharynx is clear and moist.  Neck: Neck supple. No thyromegaly present.  Cardiovascular: Normal rate and regular rhythm.   Pulmonary/Chest: Effort normal and breath sounds normal. No respiratory distress. She has no wheezes. She has no rales.  Abdominal: Soft. She exhibits no mass. There is no tenderness. There is no rebound and no guarding.  Musculoskeletal: She exhibits no edema.  Lymphadenopathy:    She has no  cervical adenopathy.  Neurological: She is alert and oriented to person, place, and time. No cranial nerve deficit.  Skin:  Patient has some dryness of both feet. She has a very small linear fissure posterior right heel. No signs of secondary infection  Psychiatric: She has a normal mood and affect. Her behavior is normal. Judgment and thought content normal.       Assessment:     Physical exam. Patient needs Prevnar 13.  Plans to get flu vaccine later this year  GERD. Symptomatically stable on omeprazole. Currently taking twice daily  Type 2 diabetes-improving control by recent A1c    Plan:     -Prevnar 13 given -Provider for flu vaccine later this year -Try reducing omeprazole to once daily and supplement with Zantac or Pepcid over-the-counter as needed. -If stable with that for several weeks try transitioning completely off omeprazole if possible -Routine follow-up 6 months and recheck A1c then  Eulas Post MD [facility]

## 2016-03-21 ENCOUNTER — Other Ambulatory Visit: Payer: Self-pay | Admitting: Family Medicine

## 2016-04-06 NOTE — Assessment & Plan Note (Signed)
Ductal carcinoma in situ (DCIS) of left breast High grade DCIS on February 02, 2011. She underwent left central partial mastectomy on February 24, 2011. She had intermediate to high grade DCIS with negative surgical margins. ER was positive at 99%, PR positive at 100%; Patient refused tamoxifen therapy. She is currently on surveillance.  Breast Cancer Surveillance: 1. Breast exam done 3 2017: Normal 2. Mammogram 02/14/2016 No abnormalities. Postsurgical changes. Breast Density Category C. I recommended that she get 3-D mammograms for surveillance. Discussed the differences between different breast density categories.   Return to clinic in 1 year for surveillance and follow-up in survivorship clinic.

## 2016-04-07 ENCOUNTER — Ambulatory Visit (HOSPITAL_BASED_OUTPATIENT_CLINIC_OR_DEPARTMENT_OTHER): Payer: Medicare Other | Admitting: Hematology and Oncology

## 2016-04-07 ENCOUNTER — Encounter: Payer: Self-pay | Admitting: Hematology and Oncology

## 2016-04-07 DIAGNOSIS — D0512 Intraductal carcinoma in situ of left breast: Secondary | ICD-10-CM

## 2016-04-07 DIAGNOSIS — Z853 Personal history of malignant neoplasm of breast: Secondary | ICD-10-CM

## 2016-04-07 NOTE — Progress Notes (Signed)
Patient Care Team: Eulas Post, MD as PCP - General (Family Medicine) Neldon Mc, MD as Surgeon (General Surgery) Maisie Fus, MD (Obstetrics and Gynecology)  DIAGNOSIS: ductal carcinoma in situ of the left breast status post central lumpectomy on 02/24/2011.   PRIOR THERAPY:  #1 patient underwent a central lumpectomy with removal of the left nipple area LOC complex on 02/24/2011. The final pathology revealed intermediate to high-grade ductal carcinoma in situ. Tumor was ER +99% PR +100%.  #2 patient then received radiation therapy from 04/14/2011 2 05/08/2011  #3 patient declined antiestrogen therapy.  CURRENT THERAPY:Observation CHIEF COMPLIANT: Annual follow-up of left breast DCIS  INTERVAL HISTORY: Julie Norton is a 82 year old lady with above-mentioned history of left breast DCIS treated with lumpectomy and radiation and declined antiestrogen therapy. She is doing extremely well without any major problems or concerns. She still stays extremely active and even works on a part-time basis. She denies any lumps or nodules in the breasts.  REVIEW OF SYSTEMS:   Constitutional: Denies fevers, chills or abnormal weight loss Eyes: Denies blurriness of vision Ears, nose, mouth, throat, and face: Denies mucositis or sore throat Respiratory: Denies cough, dyspnea or wheezes Cardiovascular: Denies palpitation, chest discomfort Gastrointestinal:  Denies nausea, heartburn or change in bowel habits Skin: Denies abnormal skin rashes Lymphatics: Denies new lymphadenopathy or easy bruising Neurological:Denies numbness, tingling or new weaknesses Behavioral/Psych: Mood is stable, no new changes  Extremities: No lower extremity edema Breast:  denies any pain or lumps or nodules in either breasts All other systems were reviewed with the patient and are negative.  I have reviewed the past medical history, past surgical history, social history and family history with the  patient and they are unchanged from previous note.  ALLERGIES:  is allergic to bactrim; dilaudid [hydromorphone hcl]; hydrocodone; invokana [canagliflozin]; and macrodantin.  MEDICATIONS:  Current Outpatient Prescriptions  Medication Sig Dispense Refill  . atorvastatin (LIPITOR) 10 MG tablet Take one-half tablet by  mouth every other day or as directed 45 tablet 3  . Calcium Carbonate-Vitamin D (CALCIUM 600+D) 600-200 MG-UNIT TABS Take 2 tablets by mouth daily.     . Cholecalciferol (VITAMIN D PO) Take 1 tablet by mouth daily.     Marland Kitchen glimepiride (AMARYL) 2 MG tablet Take 1 tablet by mouth  daily before breakfast 90 tablet 1  . glucosamine-chondroitin 500-400 MG tablet Take 2 tablets by mouth daily.     Marland Kitchen glucose blood (ONE TOUCH ULTRA TEST) test strip Check once daily. E11.40 100 each 11  . levothyroxine (SYNTHROID, LEVOTHROID) 50 MCG tablet Take 1 tablet by mouth  daily 90 tablet 0  . metFORMIN (GLUCOPHAGE-XR) 500 MG 24 hr tablet Take 1 tablet by mouth  twice a day 180 tablet 1  . omeprazole (PRILOSEC) 20 MG capsule Take 1 capsule by mouth two times daily 180 capsule 1  . ONE TOUCH ULTRA TEST test strip CHECK ONCE A DAY 50 each 3  . triamcinolone cream (KENALOG) 0.1 % Apply 1 application topically 2 (two) times daily as needed. 30 g 2   Current Facility-Administered Medications  Medication Dose Route Frequency Provider Last Rate Last Dose  . aspirin chewable tablet 81 mg  81 mg Oral Once Peter M Martinique, MD        PHYSICAL EXAMINATION: ECOG PERFORMANCE STATUS: 1 - Symptomatic but completely ambulatory  Vitals:   04/07/16 1100  BP: 132/66  Pulse: 70  Resp: 18  Temp: 97.5 F (36.4 C)   Filed Weights  04/07/16 1100  Weight: 147 lb 1.6 oz (66.7 kg)    GENERAL:alert, no distress and comfortable SKIN: skin color, texture, turgor are normal, no rashes or significant lesions EYES: normal, Conjunctiva are pink and non-injected, sclera clear OROPHARYNX:no exudate, no erythema and  lips, buccal mucosa, and tongue normal  NECK: supple, thyroid normal size, non-tender, without nodularity LYMPH:  no palpable lymphadenopathy in the cervical, axillary or inguinal LUNGS: clear to auscultation and percussion with normal breathing effort HEART: regular rate & rhythm and no murmurs and no lower extremity edema ABDOMEN:abdomen soft, non-tender and normal bowel sounds MUSCULOSKELETAL:no cyanosis of digits and no clubbing  NEURO: alert & oriented x 3 with fluent speech, no focal motor/sensory deficits EXTREMITIES: No lower extremity edema BREAST: No palpable masses or nodules in either right or left breasts. No palpable axillary supraclavicular or infraclavicular adenopathy no breast tenderness or nipple discharge. (exam performed in the presence of a chaperone)  LABORATORY DATA:  I have reviewed the data as listed   Chemistry      Component Value Date/Time   NA 141 02/04/2016 0958   NA 142 04/03/2014 1026   K 4.4 02/04/2016 0958   K 4.1 04/03/2014 1026   CL 106 02/04/2016 0958   CO2 25 02/04/2016 0958   CO2 27 04/03/2014 1026   BUN 8 02/04/2016 0958   BUN 11.8 04/03/2014 1026   CREATININE 0.80 02/04/2016 0958   CREATININE 0.8 04/03/2014 1026      Component Value Date/Time   CALCIUM 9.6 02/04/2016 0958   CALCIUM 9.7 04/03/2014 1026   ALKPHOS 59 02/04/2016 0958   ALKPHOS 75 04/03/2014 1026   AST 21 02/04/2016 0958   AST 20 04/03/2014 1026   ALT 12 02/04/2016 0958   ALT 15 04/03/2014 1026   BILITOT 0.8 02/04/2016 0958   BILITOT 0.76 04/03/2014 1026       Lab Results  Component Value Date   WBC 6.2 04/03/2014   HGB 12.8 04/03/2014   HCT 37.4 04/03/2014   MCV 85.2 04/03/2014   PLT 141 (L) 04/03/2014   NEUTROABS 3.6 04/03/2014     ASSESSMENT & PLAN:  Ductal carcinoma in situ (DCIS) of left breast Ductal carcinoma in situ (DCIS) of left breast High grade DCIS on February 02, 2011. She underwent left central partial mastectomy on February 24, 2011. She had  intermediate to high grade DCIS with negative surgical margins. ER was positive at 99%, PR positive at 100%; Patient refused tamoxifen therapy. She is currently on surveillance.  Breast Cancer Surveillance: 1. Breast exam done 3 2017: Normal 2. Mammogram 02/14/2016 No abnormalities. Postsurgical changes. Breast Density Category C. I recommended that she get 3-D mammograms for surveillance. Discussed the differences between different breast density categories.   Return to clinic in 1 year for surveillance and follow-up in survivorship clinic.   No orders of the defined types were placed in this encounter.  The patient has a good understanding of the overall plan. she agrees with it. she will call with any problems that may develop before the next visit here.   Rulon Eisenmenger, MD 04/07/16

## 2016-04-14 DIAGNOSIS — M79601 Pain in right arm: Secondary | ICD-10-CM | POA: Diagnosis not present

## 2016-04-14 DIAGNOSIS — M7712 Lateral epicondylitis, left elbow: Secondary | ICD-10-CM | POA: Diagnosis not present

## 2016-04-14 DIAGNOSIS — M7541 Impingement syndrome of right shoulder: Secondary | ICD-10-CM | POA: Diagnosis not present

## 2016-04-22 ENCOUNTER — Ambulatory Visit (INDEPENDENT_AMBULATORY_CARE_PROVIDER_SITE_OTHER): Payer: Medicare Other

## 2016-04-22 DIAGNOSIS — Z23 Encounter for immunization: Secondary | ICD-10-CM | POA: Diagnosis not present

## 2016-04-27 ENCOUNTER — Other Ambulatory Visit: Payer: Self-pay | Admitting: Family Medicine

## 2016-06-17 DIAGNOSIS — M7541 Impingement syndrome of right shoulder: Secondary | ICD-10-CM | POA: Diagnosis not present

## 2016-07-05 ENCOUNTER — Other Ambulatory Visit: Payer: Self-pay | Admitting: Family Medicine

## 2016-07-14 DIAGNOSIS — Z7984 Long term (current) use of oral hypoglycemic drugs: Secondary | ICD-10-CM | POA: Diagnosis not present

## 2016-07-14 DIAGNOSIS — E119 Type 2 diabetes mellitus without complications: Secondary | ICD-10-CM | POA: Diagnosis not present

## 2016-07-14 DIAGNOSIS — H5203 Hypermetropia, bilateral: Secondary | ICD-10-CM | POA: Diagnosis not present

## 2016-07-14 LAB — HM DIABETES EYE EXAM

## 2016-07-16 DIAGNOSIS — B353 Tinea pedis: Secondary | ICD-10-CM | POA: Diagnosis not present

## 2016-07-16 DIAGNOSIS — L821 Other seborrheic keratosis: Secondary | ICD-10-CM | POA: Diagnosis not present

## 2016-07-16 DIAGNOSIS — D225 Melanocytic nevi of trunk: Secondary | ICD-10-CM | POA: Diagnosis not present

## 2016-07-16 DIAGNOSIS — I8310 Varicose veins of unspecified lower extremity with inflammation: Secondary | ICD-10-CM | POA: Diagnosis not present

## 2016-07-21 ENCOUNTER — Encounter: Payer: Self-pay | Admitting: Family Medicine

## 2016-07-30 DIAGNOSIS — E7800 Pure hypercholesterolemia, unspecified: Secondary | ICD-10-CM | POA: Diagnosis not present

## 2016-07-30 DIAGNOSIS — I340 Nonrheumatic mitral (valve) insufficiency: Secondary | ICD-10-CM | POA: Diagnosis not present

## 2016-07-30 DIAGNOSIS — E119 Type 2 diabetes mellitus without complications: Secondary | ICD-10-CM | POA: Diagnosis not present

## 2016-07-30 DIAGNOSIS — E039 Hypothyroidism, unspecified: Secondary | ICD-10-CM | POA: Diagnosis not present

## 2016-07-30 DIAGNOSIS — I4510 Unspecified right bundle-branch block: Secondary | ICD-10-CM | POA: Diagnosis not present

## 2016-07-30 LAB — BASIC METABOLIC PANEL
BUN: 11 mg/dL (ref 7–25)
CALCIUM: 9.2 mg/dL (ref 8.6–10.4)
CO2: 25 mmol/L (ref 20–31)
Chloride: 106 mmol/L (ref 98–110)
Creat: 0.69 mg/dL (ref 0.60–0.88)
Glucose, Bld: 236 mg/dL — ABNORMAL HIGH (ref 65–99)
Potassium: 4.3 mmol/L (ref 3.5–5.3)
Sodium: 138 mmol/L (ref 135–146)

## 2016-07-30 LAB — HEPATIC FUNCTION PANEL
ALBUMIN: 3.9 g/dL (ref 3.6–5.1)
ALK PHOS: 65 U/L (ref 33–130)
ALT: 14 U/L (ref 6–29)
AST: 18 U/L (ref 10–35)
BILIRUBIN DIRECT: 0.1 mg/dL (ref <=0.2)
BILIRUBIN TOTAL: 0.7 mg/dL (ref 0.2–1.2)
Indirect Bilirubin: 0.6 mg/dL (ref 0.2–1.2)
Total Protein: 6.5 g/dL (ref 6.1–8.1)

## 2016-07-30 LAB — LIPID PANEL
Cholesterol: 208 mg/dL — ABNORMAL HIGH (ref <200)
HDL: 42 mg/dL — AB (ref >50)
LDL Cholesterol: 123 mg/dL — ABNORMAL HIGH (ref <100)
Total CHOL/HDL Ratio: 5 Ratio — ABNORMAL HIGH (ref <5.0)
Triglycerides: 213 mg/dL — ABNORMAL HIGH (ref <150)
VLDL: 43 mg/dL — ABNORMAL HIGH (ref <30)

## 2016-07-30 LAB — TSH: TSH: 1.68 mIU/L

## 2016-07-31 LAB — HEMOGLOBIN A1C
Hgb A1c MFr Bld: 10.2 % — ABNORMAL HIGH (ref <5.7)
Mean Plasma Glucose: 246 mg/dL

## 2016-08-02 NOTE — Progress Notes (Signed)
Cardiology Office Note   Date:  08/03/2016   ID:  Julie Norton, DOB 07-17-1929, MRN [id number]  PCP:  Eulas Post, MD  Cardiologist: Peter Martinique MD  Chief Complaint  Patient presents with  . Hyperlipidemia      History of Present Illness: Julie Norton is a 83 y.o. female who presents for follow up HLD and RBBB.    She has a past history of hypercholesterolemia. She also has a history of left breast cancer. She finished radiation therapy in November 2012.  She also has a history of DM managed with oral agents. Echo in 2015 showed mild MR with normal LV function. She had a normal Myoview study in November 2017.  On follow up today she notes her blood sugars have been running higher. She attributed this to cortisone shots she got in her shoulder in October and December. She is taking low dose lipitor 5 mg qod. History of intolerance to higher dose statins. She denies any chest pain, dyspnea, or edema.   Past Medical History:  Diagnosis Date  . Abdominal gas pain    suspected -- may be related to intermittent right upper quadrant discomfort radiating around her back      . Breast mass    left breast  . Cancer ([place])    left breast  . Chest pain    intermittent right upper quadrant discomfort radiating around her back      . Diabetes mellitus without complication ([place])   . Dyspepsia   . GERD (gastroesophageal reflux disease)   . H/O: hysterectomy 1978  . Hypercholesteremia   . Hypothyroidism   . Tendinitis    of the right arm and shoulder    Past Surgical History:  Procedure Laterality Date  . ABDOMINAL HYSTERECTOMY    . APPENDECTOMY  1978  . BLADDER REPAIR    . BREAST LUMPECTOMY  02/24/2011   central partial mastectomy - dr Margot Chimes  . BREAST REDUCTION SURGERY    . LIPOMA EXCISION    . TONSILLECTOMY AND ADENOIDECTOMY  1950   at 83 years of age     Current Outpatient Prescriptions  Medication Sig Dispense Refill  . atorvastatin (LIPITOR) 10 MG  tablet TAKE ONE-HALF TABLET BY  MOUTH EVERY OTHER DAY OR AS DIRECTED 45 tablet 3  . Calcium Carbonate-Vitamin D (CALCIUM 600+D) 600-200 MG-UNIT TABS Take 2 tablets by mouth daily.     . Cholecalciferol (VITAMIN D PO) Take 1 tablet by mouth daily.     . DENTAGEL 1.1 % GEL dental gel Apply 1 application topically See admin instructions.  3  . glimepiride (AMARYL) 2 MG tablet TAKE 1 TABLET BY MOUTH  DAILY BEFORE BREAKFAST 90 tablet 1  . glucosamine-chondroitin 500-400 MG tablet Take 2 tablets by mouth daily.     Marland Kitchen glucose blood (ONE TOUCH ULTRA TEST) test strip Check once daily. E11.40 100 each 11  . levothyroxine (SYNTHROID, LEVOTHROID) 50 MCG tablet TAKE 1 TABLET BY MOUTH  DAILY 90 tablet 1  . metFORMIN (GLUCOPHAGE-XR) 500 MG 24 hr tablet TAKE 1 TABLET BY MOUTH  TWICE A DAY 180 tablet 1  . omeprazole (PRILOSEC) 20 MG capsule TAKE 1 CAPSULE BY MOUTH TWO TIMES DAILY 180 capsule 1  . ONE TOUCH ULTRA TEST test strip CHECK ONCE A DAY 50 each 3  . triamcinolone cream (KENALOG) 0.1 % Apply 1 application topically 2 (two) times daily as needed. 30 g 2   Current Facility-Administered Medications  Medication Dose Route  Frequency Provider Last Rate Last Dose  . aspirin chewable tablet 81 mg  81 mg Oral Once Peter M Martinique, MD        Allergies:   Bactrim; Dilaudid [hydromorphone hcl]; Hydrocodone; Invokana [canagliflozin]; and Macrodantin    Social History:  The patient  reports that she has never smoked. She has never used smokeless tobacco. She reports that she does not drink alcohol or use drugs.   Family History:  The patient's family history includes Aortic aneurysm in her mother and sister; Cancer in her mother; Diabetes in her mother; Heart attack in her mother; Hypertension in her mother; Pneumonia in her father; Stroke in her mother.    ROS:  Please see the history of present illness.   Otherwise, review of systems are positive for none.   All other systems are reviewed and negative.     PHYSICAL EXAM: VS:  BP 120/62   Pulse 77   Ht 5\' 2"  (1.575 m)   Wt 151 lb 6.4 oz (68.7 kg)   BMI 27.69 kg/m  , BMI Body mass index is 27.69 kg/m. GEN: Well nourished, well developed, in no acute distress  HEENT: normal  Neck: no JVD, carotid bruits, or masses Cardiac: RRR; normal S1-2. Soft systolic murmur LSB.  Respiratory:  clear to auscultation bilaterally, normal work of breathing GI: soft, nontender, nondistended, + BS MS: no deformity or atrophy  Skin: warm and dry, no rash Neuro:  Strength and sensation are intact Psych: euthymic mood, full affect   EKG:  EKG is ordered today. It shows NSR with rate 77. RBBB. No acute change. I have personally reviewed and interpreted this study.   Recent Labs: 07/30/2016: ALT 14; BUN 11; Creat 0.69; Potassium 4.3; Sodium 138; TSH 1.68    Lipid Panel    Component Value Date/Time   CHOL 208 (H) 07/30/2016 0808   TRIG 213 (H) 07/30/2016 0808   HDL 42 (L) 07/30/2016 0808   CHOLHDL 5.0 (H) 07/30/2016 0808   VLDL 43 (H) 07/30/2016 0808   LDLCALC 123 (H) 07/30/2016 0808      Wt Readings from Last 3 Encounters:  08/03/16 151 lb 6.4 oz (68.7 kg)  04/07/16 147 lb 1.6 oz (66.7 kg)  03/03/16 145 lb 11.2 oz (66.1 kg)      Lexiscan myoview 05/26/16:Study Highlights    Nuclear stress EF: 72%.  There was no ST segment deviation noted during stress.  This is a low risk study.  The left ventricular ejection fraction is hyperdynamic (>65%).   1. Small, mild mid-anteroseptal perfusion defect.  This defect was actually more intense at rest than with stress.  No ischemia.  Suspect attenuation.  2. EF 72% with normal wall motion.  3. Overall low risk study.        ASSESSMENT AND PLAN:  1. Heart murmur, with echocardiogram done in July 2015.The echo is satisfactory. The LV shows good function EF 60-65%. There is mild mitral regurgitation accounting for the murmur.   2. Hypercholesterolemia- Lipids are significantly higher  than before despite the same dose of statins. I attribute this to her poor glycemic control. Recommend she follow up with Dr. Elease Hashimoto for management of her diabetes. Repeat lipids once BS under better control. If need be could increase lipitor to daily or add Zetia.   3. History of breast cancer of left breast treated with lumpectomy and radiation therapy  4. Diabetes mellitus- A1c significantly higher 10.2%. Some symptoms of increased fatigue and more frequent nocturia. Follow  up with primary care.  5. Hypothyroidism   Current medicines are reviewed at length with the patient today.  The patient does not have concerns regarding medicines.  The following changes have been made:  none  Labs/ tests ordered today include: none  Follow up in 6 months.  Signed, Peter Martinique MD, [facility]   08/03/2016 5:50 PM    [place]

## 2016-08-03 ENCOUNTER — Encounter: Payer: Self-pay | Admitting: Cardiology

## 2016-08-03 ENCOUNTER — Other Ambulatory Visit: Payer: Self-pay | Admitting: Family Medicine

## 2016-08-03 ENCOUNTER — Ambulatory Visit (INDEPENDENT_AMBULATORY_CARE_PROVIDER_SITE_OTHER): Payer: Medicare Other | Admitting: Cardiology

## 2016-08-03 VITALS — BP 120/62 | HR 77 | Ht 62.0 in | Wt 151.4 lb

## 2016-08-03 DIAGNOSIS — E7800 Pure hypercholesterolemia, unspecified: Secondary | ICD-10-CM | POA: Diagnosis not present

## 2016-08-03 DIAGNOSIS — I4510 Unspecified right bundle-branch block: Secondary | ICD-10-CM

## 2016-08-03 DIAGNOSIS — E119 Type 2 diabetes mellitus without complications: Secondary | ICD-10-CM | POA: Diagnosis not present

## 2016-08-03 NOTE — Patient Instructions (Addendum)
Work with Dr. Elease Hashimoto on controlling your blood sugar. Then we can reassess your lipids then.  I will see you in 6 months.

## 2016-09-02 DIAGNOSIS — M7541 Impingement syndrome of right shoulder: Secondary | ICD-10-CM | POA: Diagnosis not present

## 2016-09-07 DIAGNOSIS — M75101 Unspecified rotator cuff tear or rupture of right shoulder, not specified as traumatic: Secondary | ICD-10-CM | POA: Diagnosis not present

## 2016-09-07 DIAGNOSIS — M7541 Impingement syndrome of right shoulder: Secondary | ICD-10-CM | POA: Diagnosis not present

## 2016-09-15 DIAGNOSIS — H4312 Vitreous hemorrhage, left eye: Secondary | ICD-10-CM | POA: Diagnosis not present

## 2016-09-15 DIAGNOSIS — H43392 Other vitreous opacities, left eye: Secondary | ICD-10-CM | POA: Diagnosis not present

## 2016-09-15 DIAGNOSIS — H35373 Puckering of macula, bilateral: Secondary | ICD-10-CM | POA: Diagnosis not present

## 2016-09-15 DIAGNOSIS — H33002 Unspecified retinal detachment with retinal break, left eye: Secondary | ICD-10-CM | POA: Diagnosis not present

## 2016-09-15 DIAGNOSIS — H43813 Vitreous degeneration, bilateral: Secondary | ICD-10-CM | POA: Diagnosis not present

## 2016-09-17 DIAGNOSIS — H33012 Retinal detachment with single break, left eye: Secondary | ICD-10-CM | POA: Diagnosis not present

## 2016-09-17 DIAGNOSIS — H33022 Retinal detachment with multiple breaks, left eye: Secondary | ICD-10-CM | POA: Diagnosis not present

## 2016-09-18 DIAGNOSIS — H33012 Retinal detachment with single break, left eye: Secondary | ICD-10-CM | POA: Diagnosis not present

## 2016-09-18 DIAGNOSIS — H4312 Vitreous hemorrhage, left eye: Secondary | ICD-10-CM | POA: Diagnosis not present

## 2016-10-14 ENCOUNTER — Telehealth: Payer: Self-pay | Admitting: Family Medicine

## 2016-10-14 MED ORDER — FLUCONAZOLE 150 MG PO TABS: 150.0000 mg | ORAL_TABLET | Freq: Once | ORAL | 0 refills | Status: DC

## 2016-10-14 NOTE — Telephone Encounter (Signed)
Pt has yeast infection and had detach retina  And can not come in unable to drive. Pt husband in hospital. Pt would like med send to [facility] [address]

## 2016-10-14 NOTE — Telephone Encounter (Signed)
Fluconazole 150mg #1 dose

## 2016-10-14 NOTE — Telephone Encounter (Signed)
Rx sent 

## 2016-11-10 ENCOUNTER — Other Ambulatory Visit: Payer: Self-pay | Admitting: Family Medicine

## 2016-11-23 ENCOUNTER — Other Ambulatory Visit: Payer: Self-pay | Admitting: Family Medicine

## 2016-11-23 DIAGNOSIS — N760 Acute vaginitis: Secondary | ICD-10-CM | POA: Diagnosis not present

## 2016-11-23 NOTE — Telephone Encounter (Signed)
Refill once.  She needs office follow up regarding her diabetes.

## 2016-12-22 DIAGNOSIS — M7541 Impingement syndrome of right shoulder: Secondary | ICD-10-CM | POA: Diagnosis not present

## 2017-01-07 ENCOUNTER — Other Ambulatory Visit: Payer: Self-pay | Admitting: Obstetrics & Gynecology

## 2017-01-07 DIAGNOSIS — Z853 Personal history of malignant neoplasm of breast: Secondary | ICD-10-CM

## 2017-01-23 ENCOUNTER — Other Ambulatory Visit: Payer: Self-pay | Admitting: Family Medicine

## 2017-02-11 DIAGNOSIS — R8299 Other abnormal findings in urine: Secondary | ICD-10-CM | POA: Diagnosis not present

## 2017-02-11 DIAGNOSIS — Z01419 Encounter for gynecological examination (general) (routine) without abnormal findings: Secondary | ICD-10-CM | POA: Diagnosis not present

## 2017-02-15 ENCOUNTER — Other Ambulatory Visit: Payer: Self-pay | Admitting: Obstetrics & Gynecology

## 2017-02-15 ENCOUNTER — Ambulatory Visit
Admission: RE | Admit: 2017-02-15 | Discharge: 2017-02-15 | Disposition: A | Discharge status: Home / Self Care | Payer: Medicare Other | Source: Ambulatory Visit | Attending: Obstetrics & Gynecology | Admitting: Obstetrics & Gynecology

## 2017-02-15 DIAGNOSIS — Z853 Personal history of malignant neoplasm of breast: Secondary | ICD-10-CM

## 2017-02-15 DIAGNOSIS — N6310 Unspecified lump in the right breast, unspecified quadrant: Secondary | ICD-10-CM

## 2017-02-15 DIAGNOSIS — R922 Inconclusive mammogram: Secondary | ICD-10-CM | POA: Diagnosis not present

## 2017-02-15 DIAGNOSIS — N6489 Other specified disorders of breast: Secondary | ICD-10-CM | POA: Diagnosis not present

## 2017-02-15 HISTORY — DX: Personal history of irradiation: Z92.3

## 2017-02-17 ENCOUNTER — Ambulatory Visit
Admission: RE | Admit: 2017-02-17 | Discharge: 2017-02-17 | Disposition: A | Discharge status: Home / Self Care | Payer: Medicare Other | Source: Ambulatory Visit | Attending: Obstetrics & Gynecology | Admitting: Obstetrics & Gynecology

## 2017-02-17 ENCOUNTER — Other Ambulatory Visit: Payer: Self-pay | Admitting: Obstetrics & Gynecology

## 2017-02-17 DIAGNOSIS — Z853 Personal history of malignant neoplasm of breast: Secondary | ICD-10-CM

## 2017-02-17 DIAGNOSIS — N6310 Unspecified lump in the right breast, unspecified quadrant: Secondary | ICD-10-CM

## 2017-02-17 DIAGNOSIS — N6312 Unspecified lump in the right breast, upper inner quadrant: Secondary | ICD-10-CM | POA: Diagnosis not present

## 2017-02-22 ENCOUNTER — Encounter (HOSPITAL_BASED_OUTPATIENT_CLINIC_OR_DEPARTMENT_OTHER): Payer: Self-pay

## 2017-02-22 ENCOUNTER — Emergency Department (HOSPITAL_BASED_OUTPATIENT_CLINIC_OR_DEPARTMENT_OTHER)
Admission: EM | Admit: 2017-02-22 | Discharge: 2017-02-22 | Disposition: A | Discharge status: Home / Self Care | Payer: Medicare Other | Attending: Emergency Medicine | Admitting: Emergency Medicine

## 2017-02-22 ENCOUNTER — Emergency Department (HOSPITAL_BASED_OUTPATIENT_CLINIC_OR_DEPARTMENT_OTHER): Payer: Medicare Other

## 2017-02-22 DIAGNOSIS — Z7902 Long term (current) use of antithrombotics/antiplatelets: Secondary | ICD-10-CM | POA: Insufficient documentation

## 2017-02-22 DIAGNOSIS — R109 Unspecified abdominal pain: Secondary | ICD-10-CM | POA: Diagnosis not present

## 2017-02-22 DIAGNOSIS — Z853 Personal history of malignant neoplasm of breast: Secondary | ICD-10-CM | POA: Diagnosis not present

## 2017-02-22 DIAGNOSIS — E119 Type 2 diabetes mellitus without complications: Secondary | ICD-10-CM | POA: Diagnosis not present

## 2017-02-22 DIAGNOSIS — Z79899 Other long term (current) drug therapy: Secondary | ICD-10-CM | POA: Diagnosis not present

## 2017-02-22 DIAGNOSIS — R1031 Right lower quadrant pain: Secondary | ICD-10-CM | POA: Insufficient documentation

## 2017-02-22 DIAGNOSIS — Z7984 Long term (current) use of oral hypoglycemic drugs: Secondary | ICD-10-CM | POA: Diagnosis not present

## 2017-02-22 DIAGNOSIS — E039 Hypothyroidism, unspecified: Secondary | ICD-10-CM | POA: Diagnosis not present

## 2017-02-22 LAB — CBC
HEMATOCRIT: 34.4 % — AB (ref 36.0–46.0)
HEMOGLOBIN: 11.3 g/dL — AB (ref 12.0–15.0)
MCH: 25.4 pg — ABNORMAL LOW (ref 26.0–34.0)
MCHC: 32.8 g/dL (ref 30.0–36.0)
MCV: 77.3 fL — AB (ref 78.0–100.0)
Platelets: 150 10*3/uL (ref 150–400)
RBC: 4.45 MIL/uL (ref 3.87–5.11)
RDW: 15 % (ref 11.5–15.5)
WBC: 5.4 10*3/uL (ref 4.0–10.5)

## 2017-02-22 LAB — LIPASE, BLOOD: LIPASE: 37 U/L (ref 11–51)

## 2017-02-22 LAB — URINALYSIS, ROUTINE W REFLEX MICROSCOPIC
Bilirubin Urine: NEGATIVE
Hgb urine dipstick: NEGATIVE
KETONES UR: 15 mg/dL — AB
LEUKOCYTES UA: NEGATIVE
NITRITE: NEGATIVE
PH: 5.5 (ref 5.0–8.0)
Protein, ur: NEGATIVE mg/dL
Specific Gravity, Urine: 1.031 — ABNORMAL HIGH (ref 1.005–1.030)

## 2017-02-22 LAB — COMPREHENSIVE METABOLIC PANEL
ALT: 16 U/L (ref 14–54)
ANION GAP: 12 (ref 5–15)
AST: 25 U/L (ref 15–41)
Albumin: 4.2 g/dL (ref 3.5–5.0)
Alkaline Phosphatase: 62 U/L (ref 38–126)
BILIRUBIN TOTAL: 0.9 mg/dL (ref 0.3–1.2)
BUN: 14 mg/dL (ref 6–20)
CHLORIDE: 102 mmol/L (ref 101–111)
CO2: 23 mmol/L (ref 22–32)
Calcium: 8.9 mg/dL (ref 8.9–10.3)
Creatinine, Ser: 0.59 mg/dL (ref 0.44–1.00)
Glucose, Bld: 302 mg/dL — ABNORMAL HIGH (ref 65–99)
POTASSIUM: 4 mmol/L (ref 3.5–5.1)
Sodium: 137 mmol/L (ref 135–145)
TOTAL PROTEIN: 6.7 g/dL (ref 6.5–8.1)

## 2017-02-22 LAB — URINALYSIS, MICROSCOPIC (REFLEX)

## 2017-02-22 MED ORDER — TRAMADOL HCL 50 MG PO TABS: 50.0000 mg | ORAL_TABLET | Freq: Four times a day (QID) | ORAL | 0 refills | Status: DC | PRN

## 2017-02-22 MED ORDER — KETOROLAC TROMETHAMINE 30 MG/ML IJ SOLN
15.0000 mg | Freq: Once | INTRAMUSCULAR | Status: AC
  Administered 2017-02-22: 15 mg via INTRAVENOUS
  Filled 2017-02-22: qty 1

## 2017-02-22 MED ORDER — ONDANSETRON HCL 4 MG/2ML IJ SOLN
4.0000 mg | Freq: Once | INTRAMUSCULAR | Status: AC
  Administered 2017-02-22: 4 mg via INTRAVENOUS
  Filled 2017-02-22: qty 2

## 2017-02-22 MED FILL — traMADol HCL 50 MG TABS: 50 | 3 days supply | Qty: 15 | Fill #0

## 2017-02-22 NOTE — ED Notes (Signed)
ED Provider at bedside. 

## 2017-02-22 NOTE — ED Notes (Signed)
Pt transported to CT at this time.

## 2017-02-22 NOTE — ED Triage Notes (Signed)
C/o right side abd and flank pain x 1 week-denies n/v/d-had pain approx 1 month ago that went away/no medical tx-NAD-steady gait

## 2017-02-22 NOTE — ED Provider Notes (Signed)
[place] DEPT [facility] Provider Note   CSN: [id number] Arrival date & time: 02/22/17  1107     History   Chief Complaint Chief Complaint  Patient presents with  . Abdominal Pain    HPI Julie Norton is a 83 y.o. female.  Patient is a 83 year old female with a history of diabetes, hyperlipidemia and prior diverticulitis who presents with right abdominal pain. She states that about a month ago she had the same pain but it resolved spontaneously. Over the last week she's had some pain in her right flank and mid abdomen which is been intermittent, mostly at night. Today it's been more constant and worse. She denies any associated nausea or vomiting. She states the pain starts in her right midabdomen and goes to the lower abdomen and around to her lower back. She denies any urinary symptoms. No fevers. No change in bowel habits. She took some Aleve earlier today which seemed to improve the pain but she still having it.the pain doesn't seem to be related to eating. It does seem to get worse with movement. It's not worse with breathing.      Past Medical History:  Diagnosis Date  . Abdominal gas pain    suspected -- may be related to intermittent right upper quadrant discomfort radiating around her back      . Breast mass    left breast  . Cancer ([place])    left breast  . Chest pain    intermittent right upper quadrant discomfort radiating around her back      . Diabetes mellitus without complication ([place])   . Dyspepsia   . GERD (gastroesophageal reflux disease)   . H/O: hysterectomy 1978  . Hypercholesteremia   . Hypothyroidism   . Personal history of radiation therapy   . Tendinitis    of the right arm and shoulder    Patient Active Problem List   Diagnosis Date Noted  . Mitral insufficiency 02/04/2016  . Right bundle branch block 05/16/2015  . Chest pain of unknown etiology 05/16/2015  . Peripheral neuropathy 08/23/2014  . Heart murmur previously undiagnosed  01/22/2014  . Abdominal pain 11/11/2011  . Ductal carcinoma in situ (DCIS) of left breast 01/12/2011  . Hypercholesterolemia 11/28/2010  . Hypothyroidism 11/28/2010  . Postmenopausal state 11/28/2010  . Osteoarthritis 11/28/2010  . Dyspepsia 11/28/2010    Past Surgical History:  Procedure Laterality Date  . ABDOMINAL HYSTERECTOMY    . APPENDECTOMY  1978  . BLADDER REPAIR    . BREAST LUMPECTOMY Left 02/24/2011   central partial mastectomy - dr Margot Chimes  . BREAST REDUCTION SURGERY    . LIPOMA EXCISION    . REDUCTION MAMMAPLASTY Bilateral 1998  . RETINAL DETACHMENT SURGERY    . TONSILLECTOMY AND ADENOIDECTOMY  1950   at 83 years of age    OB History    No data available       Home Medications    Prior to Admission medications   Medication Sig Start Date End Date Taking? Authorizing Provider  atorvastatin (LIPITOR) 10 MG tablet TAKE ONE-HALF TABLET BY  MOUTH EVERY OTHER DAY OR AS DIRECTED 07/07/16   Burchette, Alinda Sierras, MD  Calcium Carbonate-Vitamin D (CALCIUM 600+D) 600-200 MG-UNIT TABS Take 2 tablets by mouth daily.     [provider]  Cholecalciferol (VITAMIN D PO) Take 1 tablet by mouth daily.     [provider]  DENTAGEL 1.1 % GEL dental gel Apply 1 application topically See admin instructions. 07/16/16  [provider]  glimepiride (AMARYL) 2 MG tablet TAKE 1 TABLET BY MOUTH  DAILY BEFORE BREAKFAST 11/10/16   Burchette, Alinda Sierras, MD  glucosamine-chondroitin 500-400 MG tablet Take 2 tablets by mouth daily.     [provider]  glucose blood (ONE TOUCH ULTRA TEST) test strip Check once daily. E11.40 03/20/15   Burchette, Alinda Sierras, MD  levothyroxine (SYNTHROID, LEVOTHROID) 50 MCG tablet TAKE 1 TABLET BY MOUTH  DAILY 01/25/17   Burchette, Alinda Sierras, MD  metFORMIN (GLUCOPHAGE-XR) 500 MG 24 hr tablet TAKE 1 TABLET BY MOUTH  TWICE A DAY 01/25/17   Burchette, Alinda Sierras, MD  omeprazole (PRILOSEC) 20 MG capsule TAKE 1 CAPSULE BY MOUTH TWO TIMES DAILY 11/10/16    Burchette, Alinda Sierras, MD  ONE TOUCH ULTRA TEST test strip CHECK ONCE A DAY 02/26/14   Burchette, Alinda Sierras, MD  traMADol (ULTRAM) 50 MG tablet Take 1 tablet (50 mg total) by mouth every 6 (six) hours as needed. 02/22/17   Malvin Johns, MD  triamcinolone cream (KENALOG) 0.1 % Apply 1 application topically 2 (two) times daily as needed. 09/23/15   Burchette, Alinda Sierras, MD    Family History Family History  Problem Relation Age of Onset  . Pneumonia Father   . Heart attack Mother   . Hypertension Mother   . Stroke Mother   . Diabetes Mother   . Aortic aneurysm Mother        abdominal aortic aneurysm  . Cancer Mother        bladder  . Aortic aneurysm Sister        abdominal aortic aneurysm    Social History Social History  Substance Use Topics  . Smoking status: Never Smoker  . Smokeless tobacco: Never Used  . Alcohol use No     Allergies   Bactrim; Dilaudid [hydromorphone hcl]; Hydrocodone; Invokana [canagliflozin]; and Macrodantin   Review of Systems Review of Systems  Constitutional: Negative for chills, diaphoresis, fatigue and fever.  HENT: Negative for congestion, rhinorrhea and sneezing.   Eyes: Negative.   Respiratory: Negative for cough, chest tightness and shortness of breath.   Cardiovascular: Negative for chest pain and leg swelling.  Gastrointestinal: Positive for abdominal pain. Negative for blood in stool, diarrhea, nausea and vomiting.  Genitourinary: Negative for difficulty urinating, flank pain, frequency and hematuria.  Musculoskeletal: Positive for back pain. Negative for arthralgias.  Skin: Negative for rash.  Neurological: Negative for dizziness, speech difficulty, weakness, numbness and headaches.     Physical Exam Updated Vital Signs BP (!) 148/70 (BP Location: Right Arm)   Pulse 70   Temp 98 F (36.7 C) (Oral)   Resp 16   Ht 5' 1.75" (1.568 m)   Wt 67.2 kg (148 lb 4 oz)   SpO2 99%   BMI 27.34 kg/m   Physical Exam  Constitutional: She is  oriented to person, place, and time. She appears well-developed and well-nourished.  HENT:  Head: Normocephalic and atraumatic.  Eyes: Pupils are equal, round, and reactive to light.  Neck: Normal range of motion. Neck supple.  Cardiovascular: Normal rate, regular rhythm and normal heart sounds.   Pulmonary/Chest: Effort normal and breath sounds normal. No respiratory distress. She has no wheezes. She has no rales. She exhibits no tenderness.  Abdominal: Soft. Bowel sounds are normal. There is tenderness. There is no rebound and no guarding.  Positive tenderness in the right mid and lower abdomen and right CVA area.  Musculoskeletal: Normal range of motion. She exhibits no  edema.  Lymphadenopathy:    She has no cervical adenopathy.  Neurological: She is alert and oriented to person, place, and time.  Skin: Skin is warm and dry. No rash noted.  Psychiatric: She has a normal mood and affect.     ED Treatments / Results  Labs (all labs ordered are listed, but only abnormal results are displayed) Labs Reviewed  COMPREHENSIVE METABOLIC PANEL - Abnormal; Notable for the following:       Result Value   Glucose, Bld 302 (*)    All other components within normal limits  CBC - Abnormal; Notable for the following:    Hemoglobin 11.3 (*)    HCT 34.4 (*)    MCV 77.3 (*)    MCH 25.4 (*)    All other components within normal limits  URINALYSIS, ROUTINE W REFLEX MICROSCOPIC - Abnormal; Notable for the following:    Specific Gravity, Urine 1.031 (*)    Glucose, UA >=500 (*)    Ketones, ur 15 (*)    All other components within normal limits  URINALYSIS, MICROSCOPIC (REFLEX) - Abnormal; Notable for the following:    Bacteria, UA FEW (*)    Squamous Epithelial / LPF 6-30 (*)    All other components within normal limits  LIPASE, BLOOD    EKG  EKG Interpretation None       Radiology Ct Renal Stone Study  Result Date: 02/22/2017 CLINICAL DATA:  Right flank pain.  Suspect stone disease  peer EXAM: CT ABDOMEN AND PELVIS WITHOUT CONTRAST TECHNIQUE: Multidetector CT imaging of the abdomen and pelvis was performed following the standard protocol without IV contrast. COMPARISON:  09/09/2010 FINDINGS: Lower chest: Ground-glass nodule in the right lower lobe is identified measuring 5 mm, image 6 of series 6. No pleural effusion Hepatobiliary: No focal liver abnormality is seen. No gallstones, gallbladder wall thickening, or biliary dilatation. Pancreas: Unremarkable. No pancreatic ductal dilatation or surrounding inflammatory changes. Spleen: Normal in size without focal abnormality. Adrenals/Urinary Tract: The adrenal glands are normal. Unremarkable appearance of the kidneys. No urinary tract calculi identified. Stomach/Bowel: Stomach is within normal limits. No evidence of bowel wall thickening, distention, or inflammatory changes. Numerous distal colonic diverticula noted without evidence for diverticulitis. Vascular/Lymphatic: The aortic atherosclerosis. No aneurysm. No upper abdominal or pelvic adenopathy. No inguinal adenopathy. Reproductive: Status post hysterectomy. No adnexal masses. Other: No abdominal wall hernia or abnormality. No abdominopelvic ascites. Musculoskeletal: Scoliosis and degenerative disc disease is identified within the lumbar spine. IMPRESSION: 1. No evidence for urinary tract calculi. 2. No acute findings within the abdomen or pelvis to explain patient's right flank pain. 3. Ground-glass attenuating nodule in the right lower lobe is identified measuring 5 mm. No follow-up recommended. This recommendation follows the consensus statement: Guidelines for Management of Incidental Pulmonary Nodules Detected on CT Images: From the Fleischner Society 2017; Radiology 2017; 284:228-243. 4. Scoliosis and degenerative disc disease. Electronically Signed   By: Kerby Moors M.D.   On: 02/22/2017 12:16    Procedures Procedures (including critical care time)  Medications Ordered in  ED Medications  ketorolac (TORADOL) 30 MG/ML injection 15 mg (15 mg Intravenous Given 02/22/17 1326)  ondansetron (ZOFRAN) injection 4 mg (4 mg Intravenous Given 02/22/17 1326)     Initial Impression / Assessment and Plan / ED Course  I have reviewed the triage vital signs and the nursing notes.  Pertinent labs & imaging results that were available during my care of the patient were reviewed by me and considered in my medical  decision making (see chart for details).     Patient presents with right flank pain. CT scan does not show any evidence of acute abnormality. Her labs are non-concerning. Her urine does not appear to be infected. She was given dose of Toradol in the ED and her pain is completely controlled. I suspect this may be musculoskeletal. She doesn't have any radicular symptoms or signs of cauda equina. She was discharged from good condition. She was given a prescription for tramadol. She was encouraged to have follow-up with her PCP for recheck. Return precautions were given.  Final Clinical Impressions(s) / ED Diagnoses   Final diagnoses:  Acute right flank pain    New Prescriptions New Prescriptions   TRAMADOL (ULTRAM) 50 MG TABLET    Take 1 tablet (50 mg total) by mouth every 6 (six) hours as needed.     Malvin Johns, MD 02/22/17 1409

## 2017-02-22 NOTE — ED Notes (Signed)
Pt requesting some nausea medication at this time.

## 2017-02-23 ENCOUNTER — Encounter: Payer: Self-pay | Admitting: Family Medicine

## 2017-02-23 ENCOUNTER — Ambulatory Visit (INDEPENDENT_AMBULATORY_CARE_PROVIDER_SITE_OTHER): Payer: Medicare Other | Admitting: Family Medicine

## 2017-02-23 VITALS — BP 140/80 | HR 88 | Temp 98.8°F | Wt 149.2 lb

## 2017-02-23 DIAGNOSIS — D649 Anemia, unspecified: Secondary | ICD-10-CM

## 2017-02-23 DIAGNOSIS — R1031 Right lower quadrant pain: Secondary | ICD-10-CM | POA: Diagnosis not present

## 2017-02-23 DIAGNOSIS — E1165 Type 2 diabetes mellitus with hyperglycemia: Secondary | ICD-10-CM

## 2017-02-23 DIAGNOSIS — D509 Iron deficiency anemia, unspecified: Secondary | ICD-10-CM | POA: Diagnosis not present

## 2017-02-23 NOTE — Patient Instructions (Signed)
Set up one week follow up Complete hemoccult cards Follow up immediately for any fever or increased pain.

## 2017-02-23 NOTE — Progress Notes (Signed)
Subjective:     Patient ID: Julie Norton, female   DOB: 07/10/1929, 83 y.o.   MRN: [id number]  HPI Patient seen for ER follow-up. She has chronic problems of osteoarthritis, hyperlipidemia, type 2 diabetes, remote history of breast cancer and reported history of diverticulitis. She presented to the ER yesterday with right lower quadrant abdominal pain. She states she's had previous hysterectomy and both ovaries have been removed. She had reported similar episode about a month ago but then pain resolved spontaneously. Her pain was mid abdomen and radiated laterally toward the back and worse at night. She had no urinary symptoms no fever. Labs were significant for hemoglobin 11.3 with MCV 77.3. Most recent hemoglobin around a year ago 12.8. White count normal. Urinalysis unremarkable. She had lipase and chemistries that were normal with exception of elevated glucose. CT renal stone study no kidney stones and no acute findings  Patient was given Toradol in the ER and pain almost completely resolved afterwards. They thought most likely musculoskeletal pain. She's not had any severe low back pain.  Type 2 diabetes with history of poor control. She currently takes metformin and low-dose Amaryl. Previous intolerance with invokana.  Past Medical History:  Diagnosis Date  . Abdominal gas pain    suspected -- may be related to intermittent right upper quadrant discomfort radiating around her back      . Breast mass    left breast  . Cancer ([place])    left breast  . Chest pain    intermittent right upper quadrant discomfort radiating around her back      . Diabetes mellitus without complication ([place])   . Dyspepsia   . GERD (gastroesophageal reflux disease)   . H/O: hysterectomy 1978  . Hypercholesteremia   . Hypothyroidism   . Personal history of radiation therapy   . Tendinitis    of the right arm and shoulder   Past Surgical History:  Procedure Laterality Date  . ABDOMINAL HYSTERECTOMY    .  APPENDECTOMY  1978  . BLADDER REPAIR    . BREAST LUMPECTOMY Left 02/24/2011   central partial mastectomy - dr Margot Chimes  . BREAST REDUCTION SURGERY    . LIPOMA EXCISION    . REDUCTION MAMMAPLASTY Bilateral 1998  . RETINAL DETACHMENT SURGERY    . TONSILLECTOMY AND ADENOIDECTOMY  1950   at 83 years of age    reports that she has never smoked. She has never used smokeless tobacco. She reports that she does not drink alcohol or use drugs. family history includes Aortic aneurysm in her mother and sister; Cancer in her mother; Diabetes in her mother; Heart attack in her mother; Hypertension in her mother; Pneumonia in her father; Stroke in her mother. Allergies  Allergen Reactions  . Bactrim Nausea And Vomiting  . Dilaudid [Hydromorphone Hcl]   . Hydrocodone Nausea And Vomiting  . Invokana [Canagliflozin] Itching and Other (See Comments)    Yeast Infections   . Macrodantin Nausea And Vomiting     Review of Systems  Constitutional: Negative for fatigue.  Eyes: Negative for visual disturbance.  Respiratory: Negative for cough, chest tightness, shortness of breath and wheezing.   Cardiovascular: Negative for chest pain, palpitations and leg swelling.  Gastrointestinal: Positive for abdominal pain. Negative for blood in stool, nausea and vomiting.  Genitourinary: Negative for dysuria and hematuria.  Neurological: Negative for dizziness, seizures, syncope, weakness, light-headedness and headaches.       Objective:   Physical Exam  Constitutional: She appears well-developed  and well-nourished.  Eyes: Pupils are equal, round, and reactive to light.  Neck: Neck supple. No JVD present. No thyromegaly present.  Cardiovascular: Normal rate and regular rhythm.  Exam reveals no gallop.   Pulmonary/Chest: Effort normal and breath sounds normal. No respiratory distress. She has no wheezes. She has no rales.  Abdominal: Soft. Bowel sounds are normal. She exhibits no distension and no mass. There is  tenderness. There is no rebound and no guarding.  Mildly tender to palpation right lower quadrant. No guarding or rebound.  Musculoskeletal: She exhibits no edema.  Neurological: She is alert.       Assessment:     #1 Abdominal pain right lower quadrant with ER evaluation as above. Etiology is unclear but improved at this time.  No evidence for kidney stone or acute diverticulitis. Question musculoskeletal  #2 Microcytic anemia-mild by recent labs in ER-  This is a new finding  #3 Type 2 diabetes poorly controlled-poor compliance with follow-up    Plan:     -Complete Hemoccult cards -Check further labs with TIBC, serum iron, ferritin -Recheck hemoglobin A1c -Bring back in one week to reassess and consider addition of long-acting insulin then (and discontinuation of Amaryl) -Follow-up immediately for any recurrent abdominal pain, fever, or other concerns  Eulas Post MD [facility]

## 2017-02-24 LAB — FERRITIN: Ferritin: 9.2 ng/mL — ABNORMAL LOW (ref 10.0–291.0)

## 2017-02-24 LAB — IRON AND TIBC
%SAT: 11 % (ref 11–50)
Iron: 51 ug/dL (ref 45–160)
TIBC: 480 ug/dL — AB (ref 250–450)
UIBC: 429 ug/dL

## 2017-02-24 LAB — HEMOGLOBIN A1C: HEMOGLOBIN A1C: 9.8 % — AB (ref 4.6–6.5)

## 2017-03-02 ENCOUNTER — Ambulatory Visit (INDEPENDENT_AMBULATORY_CARE_PROVIDER_SITE_OTHER): Payer: Medicare Other | Admitting: Family Medicine

## 2017-03-02 ENCOUNTER — Encounter: Payer: Self-pay | Admitting: Family Medicine

## 2017-03-02 VITALS — BP 118/72 | HR 74 | Temp 98.0°F | Ht 61.75 in | Wt 149.0 lb

## 2017-03-02 DIAGNOSIS — D509 Iron deficiency anemia, unspecified: Secondary | ICD-10-CM | POA: Diagnosis not present

## 2017-03-02 DIAGNOSIS — E1165 Type 2 diabetes mellitus with hyperglycemia: Secondary | ICD-10-CM | POA: Diagnosis not present

## 2017-03-02 LAB — POC HEMOCCULT BLD/STL (HOME/3-CARD/SCREEN)
Card #2 Date: 8232018
Card #2 Fecal Occult Blod, POC: NEGATIVE
Card #3 Date: 8242018
Card #3 Fecal Occult Blood, POC: NEGATIVE
Fecal Occult Blood, POC: NEGATIVE
OCCULT BLOOD DATE: 8222018

## 2017-03-02 NOTE — Patient Instructions (Signed)
Start the [place] 10 units subcutaneous once daily. Check fasting sugars Titrate up Tresiba 2 units every 2-3 days until fasting sugars around 130-140 range. Stay on the Metformin Bring log of sugars at follow up I will set up referral to Dr Michail Sermon.

## 2017-03-02 NOTE — Progress Notes (Signed)
Subjective:     Patient ID: Julie Norton, female   DOB: 1930-03-28, 83 y.o.   MRN: [id number]  HPI Patient seen for medical follow-up. Recent episode right lower quadrant abdominal pain which is fully resolved. She had hemoglobin in the 11 range which was new with microcytosis. We obtain additional labs. Ferritin level low 9.2 with slightly low iron saturation (11%). Her TIBC was elevated. Hemoccult cards negative. No gross hematochezia. No appetite or weight changes. Last colonoscopy estimated 10 years ago-or longer.  Type 2 diabetes. Poor control. Last A1c 9.8%. Currently on metformin and glimepiride. Previous intolerance with SGLT 2 class. She has some chronic low-grade nausea and we felt GLP-1 class would not be appropriate for her.  Past Medical History:  Diagnosis Date  . Abdominal gas pain    suspected -- may be related to intermittent right upper quadrant discomfort radiating around her back      . Breast mass    left breast  . Cancer ([place])    left breast  . Chest pain    intermittent right upper quadrant discomfort radiating around her back      . Diabetes mellitus without complication ([place])   . Dyspepsia   . GERD (gastroesophageal reflux disease)   . H/O: hysterectomy 1978  . Hypercholesteremia   . Hypothyroidism   . Personal history of radiation therapy   . Tendinitis    of the right arm and shoulder   Past Surgical History:  Procedure Laterality Date  . ABDOMINAL HYSTERECTOMY    . APPENDECTOMY  1978  . BLADDER REPAIR    . BREAST LUMPECTOMY Left 02/24/2011   central partial mastectomy - dr Margot Chimes  . BREAST REDUCTION SURGERY    . LIPOMA EXCISION    . REDUCTION MAMMAPLASTY Bilateral 1998  . RETINAL DETACHMENT SURGERY    . TONSILLECTOMY AND ADENOIDECTOMY  1950   at 83 years of age    reports that she has never smoked. She has never used smokeless tobacco. She reports that she does not drink alcohol or use drugs. family history includes Aortic aneurysm in her  mother and sister; Cancer in her mother; Diabetes in her mother; Heart attack in her mother; Hypertension in her mother; Pneumonia in her father; Stroke in her mother. Allergies  Allergen Reactions  . Bactrim Nausea And Vomiting  . Dilaudid [Hydromorphone Hcl]   . Hydrocodone Nausea And Vomiting  . Invokana [Canagliflozin] Itching and Other (See Comments)    Yeast Infections   . Macrodantin Nausea And Vomiting     Review of Systems  Constitutional: Negative for fatigue.  Eyes: Negative for visual disturbance.  Respiratory: Negative for cough, chest tightness, shortness of breath and wheezing.   Cardiovascular: Negative for chest pain, palpitations and leg swelling.  Gastrointestinal: Positive for nausea. Negative for abdominal pain, blood in stool and vomiting.  Neurological: Negative for dizziness, seizures, syncope, weakness, light-headedness and headaches.       Objective:   Physical Exam  Constitutional: She appears well-developed and well-nourished.  Eyes: Pupils are equal, round, and reactive to light.  Neck: Neck supple. No JVD present. No thyromegaly present.  Cardiovascular: Normal rate and regular rhythm.  Exam reveals no gallop.   Pulmonary/Chest: Effort normal and breath sounds normal. No respiratory distress. She has no wheezes. She has no rales.  Musculoskeletal: She exhibits no edema.  Neurological: She is alert.       Assessment:     #1 type 2 diabetes poorly controlled with recent A1c  9.8%. Previous intolerance with SGLT 2 class.  She is not a good candidate probably for GLP-1 class with her tendency toward nausea in the past  #2 iron deficiency anemia. Hemoccult cards negative    Plan:     -Set up GI referral for further evaluation -Start long-acting insulin with Tresiba 10 units once daily.  -Monitor daily fasting blood sugars and titrate up 2 units every couple days until fasting blood sugars are 130 -Patient instructed in use -Continue with  metformin. We'll try to get off Amaryl soon -Follow-up in 3 weeks to reassess and see her as needed  Eulas Post MD [facility]

## 2017-03-15 ENCOUNTER — Other Ambulatory Visit: Payer: Self-pay | Admitting: Gastroenterology

## 2017-03-15 DIAGNOSIS — D509 Iron deficiency anemia, unspecified: Secondary | ICD-10-CM | POA: Diagnosis not present

## 2017-03-16 ENCOUNTER — Other Ambulatory Visit: Payer: Self-pay | Admitting: Gastroenterology

## 2017-03-18 ENCOUNTER — Encounter: Payer: Self-pay | Admitting: Family Medicine

## 2017-03-19 ENCOUNTER — Encounter: Payer: Self-pay | Admitting: Cardiology

## 2017-03-23 ENCOUNTER — Encounter (HOSPITAL_COMMUNITY): Payer: Self-pay | Admitting: *Deleted

## 2017-03-23 ENCOUNTER — Encounter: Payer: Self-pay | Admitting: Family Medicine

## 2017-03-23 ENCOUNTER — Ambulatory Visit (INDEPENDENT_AMBULATORY_CARE_PROVIDER_SITE_OTHER): Payer: Medicare Other | Admitting: Family Medicine

## 2017-03-23 VITALS — BP 110/78 | HR 81 | Temp 98.4°F | Wt 145.8 lb

## 2017-03-23 DIAGNOSIS — E1165 Type 2 diabetes mellitus with hyperglycemia: Secondary | ICD-10-CM | POA: Diagnosis not present

## 2017-03-23 NOTE — Progress Notes (Signed)
Julie Norton reports that CBG's run high, today it was 219.  I instructed patient to take 1/2 of Tresiba tonight, to NOT take Metformin in am.  I instructed patient to check CBG after awaking and every 2 hours until arrival  to the hospital.  I Instructed patient if CBG is less than 70 to  1/2 cup of apple juice. Recheck CBG in 15 minutes then call pre- op desk at [phone] for further instructions.

## 2017-03-23 NOTE — Progress Notes (Signed)
Subjective:     Patient ID: Julie Norton, female   DOB: May 09, 1930, 83 y.o.   MRN: [id number]  HPI Patient for follow-up type 2 diabetes. History of poor control. Last A1c 9.8%. We added insulin last visit with Tresiba-initially 10 units once daily. She has titrated this up currently to 14 units daily. Blood sugar still ranging up between 150 generally in 200. She remains on metformin. She's had a tendency toward chronic nausea in the past and is not a good candidate for GLP-1 class. She's had history of frequent UTIs in the past. Not a candidate for SGLT 2 class (previous intolerance). Overall feels well. She is getting ready to get EGD and colonoscopy tomorrow. She does have history of some recent mild iron deficiency anemia.  Past Medical History:  Diagnosis Date  . Abdominal gas pain    suspected -- may be related to intermittent right upper quadrant discomfort radiating around her back      . Breast mass    left breast  . Cancer ([place])    left breast  . Chest pain    intermittent right upper quadrant discomfort radiating around her back      . Diabetes mellitus without complication ([place])   . Dyspepsia   . GERD (gastroesophageal reflux disease)   . H/O: hysterectomy 1978  . Hypercholesteremia   . Hypothyroidism   . Personal history of radiation therapy   . Tendinitis    of the right arm and shoulder   Past Surgical History:  Procedure Laterality Date  . ABDOMINAL HYSTERECTOMY    . APPENDECTOMY  1978  . BLADDER REPAIR    . BREAST LUMPECTOMY Left 02/24/2011   central partial mastectomy - dr Margot Chimes  . BREAST REDUCTION SURGERY    . LIPOMA EXCISION    . REDUCTION MAMMAPLASTY Bilateral 1998  . RETINAL DETACHMENT SURGERY    . TONSILLECTOMY AND ADENOIDECTOMY  1950   at 83 years of age    reports that she has never smoked. She has never used smokeless tobacco. She reports that she does not drink alcohol or use drugs. family history includes Aortic aneurysm in her mother and  sister; Cancer in her mother; Diabetes in her mother; Heart attack in her mother; Hypertension in her mother; Pneumonia in her father; Stroke in her mother. Allergies  Allergen Reactions  . Bactrim Nausea And Vomiting  . Dilaudid [Hydromorphone Hcl]   . Hydrocodone Nausea And Vomiting  . Invokana [Canagliflozin] Itching and Other (See Comments)    Yeast Infections   . Macrodantin Nausea And Vomiting     Review of Systems  Constitutional: Negative for fatigue.  Eyes: Negative for visual disturbance.  Respiratory: Negative for cough, chest tightness, shortness of breath and wheezing.   Cardiovascular: Negative for chest pain, palpitations and leg swelling.  Neurological: Negative for dizziness, seizures, syncope, weakness, light-headedness and headaches.       Objective:   Physical Exam  Constitutional: She appears well-developed and well-nourished.  Eyes: Pupils are equal, round, and reactive to light.  Neck: Neck supple. No JVD present. No thyromegaly present.  Cardiovascular: Normal rate and regular rhythm.  Exam reveals no gallop.   Pulmonary/Chest: Effort normal and breath sounds normal. No respiratory distress. She has no wheezes. She has no rales.  Musculoskeletal: She exhibits no edema.  Neurological: She is alert.       Assessment:     Type 2 diabetes. History of poor control with recent initiation of long term insulin  Plan:     -continue slow titration of insulin up 2 units every few days until fasting blood sugars around 130 or less -Continue metformin -3 month follow-up and repeat A1c then -Would continue to hold sulfonylurea because of increasing risk of hypoglycemia at her age and with recent initiation of insulin  Eulas Post MD [facility]

## 2017-03-23 NOTE — Patient Instructions (Signed)
Continue to titrate up the Tresiba 2 units every few days until fasting sugars around 130 or less.

## 2017-03-24 ENCOUNTER — Ambulatory Visit (HOSPITAL_COMMUNITY): Payer: Medicare Other | Admitting: Anesthesiology

## 2017-03-24 ENCOUNTER — Ambulatory Visit (HOSPITAL_COMMUNITY)
Admission: RE | Admit: 2017-03-24 | Discharge: 2017-03-24 | Disposition: A | Discharge status: Home / Self Care | Payer: Medicare Other | Source: Ambulatory Visit | Attending: Gastroenterology | Admitting: Gastroenterology

## 2017-03-24 ENCOUNTER — Encounter (HOSPITAL_COMMUNITY)
Admission: RE | Disposition: A | Discharge status: Home / Self Care | Payer: Self-pay | Source: Ambulatory Visit | Attending: Gastroenterology

## 2017-03-24 ENCOUNTER — Telehealth: Payer: Self-pay | Admitting: Cardiology

## 2017-03-24 ENCOUNTER — Encounter (HOSPITAL_COMMUNITY): Payer: Self-pay | Admitting: *Deleted

## 2017-03-24 DIAGNOSIS — Z7984 Long term (current) use of oral hypoglycemic drugs: Secondary | ICD-10-CM | POA: Diagnosis not present

## 2017-03-24 DIAGNOSIS — K5730 Diverticulosis of large intestine without perforation or abscess without bleeding: Secondary | ICD-10-CM | POA: Diagnosis not present

## 2017-03-24 DIAGNOSIS — E785 Hyperlipidemia, unspecified: Secondary | ICD-10-CM | POA: Diagnosis not present

## 2017-03-24 DIAGNOSIS — Z823 Family history of stroke: Secondary | ICD-10-CM | POA: Insufficient documentation

## 2017-03-24 DIAGNOSIS — I4510 Unspecified right bundle-branch block: Secondary | ICD-10-CM | POA: Diagnosis not present

## 2017-03-24 DIAGNOSIS — K621 Rectal polyp: Secondary | ICD-10-CM | POA: Diagnosis not present

## 2017-03-24 DIAGNOSIS — K449 Diaphragmatic hernia without obstruction or gangrene: Secondary | ICD-10-CM | POA: Insufficient documentation

## 2017-03-24 DIAGNOSIS — Z885 Allergy status to narcotic agent status: Secondary | ICD-10-CM | POA: Insufficient documentation

## 2017-03-24 DIAGNOSIS — M1990 Unspecified osteoarthritis, unspecified site: Secondary | ICD-10-CM | POA: Diagnosis not present

## 2017-03-24 DIAGNOSIS — K640 First degree hemorrhoids: Secondary | ICD-10-CM | POA: Diagnosis not present

## 2017-03-24 DIAGNOSIS — I340 Nonrheumatic mitral (valve) insufficiency: Secondary | ICD-10-CM | POA: Diagnosis not present

## 2017-03-24 DIAGNOSIS — R011 Cardiac murmur, unspecified: Secondary | ICD-10-CM | POA: Insufficient documentation

## 2017-03-24 DIAGNOSIS — Z91048 Other nonmedicinal substance allergy status: Secondary | ICD-10-CM | POA: Insufficient documentation

## 2017-03-24 DIAGNOSIS — Z8052 Family history of malignant neoplasm of bladder: Secondary | ICD-10-CM | POA: Insufficient documentation

## 2017-03-24 DIAGNOSIS — G629 Polyneuropathy, unspecified: Secondary | ICD-10-CM | POA: Diagnosis not present

## 2017-03-24 DIAGNOSIS — Z90710 Acquired absence of both cervix and uterus: Secondary | ICD-10-CM | POA: Diagnosis not present

## 2017-03-24 DIAGNOSIS — Z881 Allergy status to other antibiotic agents status: Secondary | ICD-10-CM | POA: Insufficient documentation

## 2017-03-24 DIAGNOSIS — D509 Iron deficiency anemia, unspecified: Secondary | ICD-10-CM | POA: Diagnosis not present

## 2017-03-24 DIAGNOSIS — E7800 Pure hypercholesterolemia, unspecified: Secondary | ICD-10-CM | POA: Insufficient documentation

## 2017-03-24 DIAGNOSIS — Z923 Personal history of irradiation: Secondary | ICD-10-CM | POA: Insufficient documentation

## 2017-03-24 DIAGNOSIS — Z79899 Other long term (current) drug therapy: Secondary | ICD-10-CM | POA: Diagnosis not present

## 2017-03-24 DIAGNOSIS — K219 Gastro-esophageal reflux disease without esophagitis: Secondary | ICD-10-CM | POA: Diagnosis not present

## 2017-03-24 DIAGNOSIS — Z833 Family history of diabetes mellitus: Secondary | ICD-10-CM | POA: Insufficient documentation

## 2017-03-24 DIAGNOSIS — E119 Type 2 diabetes mellitus without complications: Secondary | ICD-10-CM | POA: Insufficient documentation

## 2017-03-24 DIAGNOSIS — E039 Hypothyroidism, unspecified: Secondary | ICD-10-CM | POA: Insufficient documentation

## 2017-03-24 DIAGNOSIS — Z853 Personal history of malignant neoplasm of breast: Secondary | ICD-10-CM | POA: Diagnosis not present

## 2017-03-24 DIAGNOSIS — Z8249 Family history of ischemic heart disease and other diseases of the circulatory system: Secondary | ICD-10-CM | POA: Insufficient documentation

## 2017-03-24 HISTORY — DX: Other injury of unspecified body region, initial encounter: T14.8XXA

## 2017-03-24 HISTORY — PX: COLONOSCOPY WITH PROPOFOL: SHX5780

## 2017-03-24 HISTORY — DX: Pneumonia, unspecified organism: J18.9

## 2017-03-24 HISTORY — DX: Adverse effect of unspecified anesthetic, initial encounter: T41.45XA

## 2017-03-24 HISTORY — PX: ESOPHAGOGASTRODUODENOSCOPY (EGD) WITH PROPOFOL: SHX5813

## 2017-03-24 HISTORY — DX: Nausea with vomiting, unspecified: R11.2

## 2017-03-24 HISTORY — DX: Other complications of anesthesia, initial encounter: T88.59XA

## 2017-03-24 HISTORY — DX: Other specified postprocedural states: Z98.890

## 2017-03-24 HISTORY — DX: Peritoneal adhesions (postprocedural) (postinfection): K66.0

## 2017-03-24 LAB — GLUCOSE, CAPILLARY: Glucose-Capillary: 151 mg/dL — ABNORMAL HIGH (ref 65–99)

## 2017-03-24 SURGERY — COLONOSCOPY WITH PROPOFOL
Anesthesia: Monitor Anesthesia Care

## 2017-03-24 MED ORDER — ONDANSETRON HCL 4 MG/2ML IJ SOLN
INTRAMUSCULAR | Status: DC | PRN
  Administered 2017-03-24: 4 mg via INTRAVENOUS

## 2017-03-24 MED ORDER — LIDOCAINE 2% (20 MG/ML) 5 ML SYRINGE
INTRAMUSCULAR | Status: DC | PRN
  Administered 2017-03-24: 40 mg via INTRAVENOUS

## 2017-03-24 MED ORDER — SODIUM CHLORIDE 0.9 % IV SOLN: INTRAVENOUS | Status: DC

## 2017-03-24 MED ORDER — PROPOFOL 10 MG/ML IV BOLUS
INTRAVENOUS | Status: DC | PRN
  Administered 2017-03-24: 10 mg via INTRAVENOUS
  Administered 2017-03-24: 20 mg via INTRAVENOUS

## 2017-03-24 MED ORDER — PROPOFOL 500 MG/50ML IV EMUL
INTRAVENOUS | Status: DC | PRN
  Administered 2017-03-24: 100 ug/kg/min via INTRAVENOUS

## 2017-03-24 MED ORDER — LACTATED RINGERS IV SOLN
INTRAVENOUS | Status: DC
  Administered 2017-03-24: 09:00:00 via INTRAVENOUS

## 2017-03-24 SURGICAL SUPPLY — 25 items

## 2017-03-24 NOTE — H&P (Signed)
Date of Initial H&P: 03/15/17  History reviewed, patient examined, no change in status, stable for surgery.

## 2017-03-24 NOTE — Transfer of Care (Signed)
Immediate Anesthesia Transfer of Care Note  Patient: Julie Norton  Procedure(s) Performed: Procedure(s): COLONOSCOPY WITH PROPOFOL (N/A) ESOPHAGOGASTRODUODENOSCOPY (EGD) WITH PROPOFOL (N/A)  Patient Location: Endoscopy Unit  Anesthesia Type:MAC  Level of Consciousness: drowsy  Airway & Oxygen Therapy: Patient Spontanous Breathing  Post-op Assessment: Report given to RN and Post -op Vital signs reviewed and stable  Post vital signs: Reviewed and stable  Last Vitals:  Vitals:   03/24/17 0830  BP: (!) 151/66  Pulse: 73  Resp: (!) 23  Temp: 36.5 C  SpO2: 99%    Last Pain:  Vitals:   03/24/17 0830  TempSrc: Oral         Complications: No apparent anesthesia complications

## 2017-03-24 NOTE — Anesthesia Postprocedure Evaluation (Signed)
Anesthesia Post Note  Patient: Julie Norton  Procedure(s) Performed: Procedure(s) (LRB): COLONOSCOPY WITH PROPOFOL (N/A) ESOPHAGOGASTRODUODENOSCOPY (EGD) WITH PROPOFOL (N/A)     Patient location during evaluation: PACU Anesthesia Type: MAC Level of consciousness: awake and alert Pain management: pain level controlled Vital Signs Assessment: post-procedure vital signs reviewed and stable Respiratory status: spontaneous breathing, nonlabored ventilation, respiratory function stable and patient connected to nasal cannula oxygen Cardiovascular status: stable and blood pressure returned to baseline Postop Assessment: no apparent nausea or vomiting Anesthetic complications: no    Last Vitals:  Vitals:   03/24/17 1005 03/24/17 1015  BP: (!) 141/62 (!) 139/58  Pulse: (!) 58 63  Resp: 13 19  Temp:    SpO2: 99% 99%    Last Pain:  Vitals:   03/24/17 0945  TempSrc: Oral                 Tiajuana Amass

## 2017-03-24 NOTE — Op Note (Signed)
[facility] Patient Name: Julie Norton Procedure Date : 03/24/2017 MRN: [id number] Attending MD: Lear Ng , MD Date of Birth: Aug 01, 1929 CSN: [id number] Age: 83 Admit Type: Outpatient Procedure:                Upper GI endoscopy Indications:              Iron deficiency anemia Providers:                Lear Ng, MD, Burtis Junes, RN, Marcene Duos, Technician Referring MD:             Eulas Post Medicines:                Propofol per Anesthesia, Monitored Anesthesia Care Complications:            No immediate complications. Estimated Blood Loss:     Estimated blood loss was minimal. Procedure:                Pre-Anesthesia Assessment:                           - Prior to the procedure, a History and Physical                            was performed, and patient medications and                            allergies were reviewed. The patient's tolerance of                            previous anesthesia was also reviewed. The risks                            and benefits of the procedure and the sedation                            options and risks were discussed with the patient.                            All questions were answered, and informed consent                            was obtained. Prior Anticoagulants: The patient has                            taken no previous anticoagulant or antiplatelet                            agents. ASA Grade Assessment: II - A patient with                            mild systemic disease. After reviewing the risks  and benefits, the patient was deemed in                            satisfactory condition to undergo the procedure.                           After obtaining informed consent, the endoscope was                            passed under direct vision. Throughout the                            procedure, the patient's blood pressure, pulse,  and                            oxygen saturations were monitored continuously. The                            [id number] ([id number]) scope was introduced through the                            mouth, and advanced to the second part of duodenum.                            The upper GI endoscopy was accomplished without                            difficulty. The patient tolerated the procedure                            well. Scope In: Scope Out: Findings:      The examined esophagus was normal.      The Z-line was regular and was found 35 cm from the incisors.      A small hiatal hernia was present.      The exam of the stomach was otherwise normal.      The examined duodenum was normal. Biopsies for histology were taken with       a cold forceps for evaluation of celiac disease. Estimated blood loss       was minimal. Impression:               - Normal esophagus.                           - Z-line regular, 35 cm from the incisors.                           - Small hiatal hernia.                           - Normal examined duodenum. Biopsied. Moderate Sedation:      N/A - MAC procedure Recommendation:           - Await pathology results.                           -  Resume previous diet.                           - Continue present medications.                           - Patient has a contact number available for                            emergencies. The signs and symptoms of potential                            delayed complications were discussed with the                            patient. Return to normal activities tomorrow.                            Written discharge instructions were provided to the                            patient. Procedure Code(s):        --- Professional ---                           [phone], Esophagogastroduodenoscopy, flexible,                            transoral; with biopsy, single or multiple Diagnosis Code(s):        --- Professional ---                            D50.9, Iron deficiency anemia, unspecified                           K44.9, Diaphragmatic hernia without obstruction or                            gangrene CPT copyright 2016 [employer]. All rights reserved. The codes documented in this report are preliminary and upon coder review may  be revised to meet current compliance requirements. Lear Ng, MD 03/24/2017 9:43:53 AM This report has been signed electronically. Number of Addenda: 0

## 2017-03-24 NOTE — Discharge Instructions (Signed)

## 2017-03-24 NOTE — Op Note (Signed)
[facility] Patient Name: Julie Norton Procedure Date : 03/24/2017 MRN: [id number] Attending MD: Lear Ng , MD Date of Birth: August 08, 1929 CSN: [id number] Age: 83 Admit Type: Outpatient Procedure:                Colonoscopy Indications:              Last colonoscopy: December 2008, Iron deficiency                            anemia Providers:                Lear Ng, MD, Burtis Junes, RN, Marcene Duos, Technician Referring MD:              Medicines:                Propofol per Anesthesia, Monitored Anesthesia Care Complications:            No immediate complications. Estimated Blood Loss:     Estimated blood loss was minimal. Procedure:                Pre-Anesthesia Assessment:                           - Prior to the procedure, a History and Physical                            was performed, and patient medications and                            allergies were reviewed. The patient's tolerance of                            previous anesthesia was also reviewed. The risks                            and benefits of the procedure and the sedation                            options and risks were discussed with the patient.                            All questions were answered, and informed consent                            was obtained. Prior Anticoagulants: The patient has                            taken no previous anticoagulant or antiplatelet                            agents. ASA Grade Assessment: II - A patient with  mild systemic disease. After reviewing the risks                            and benefits, the patient was deemed in                            satisfactory condition to undergo the procedure.                           - Prior to the procedure, a History and Physical                            was performed, and patient medications and                            allergies were  reviewed. The patient's tolerance of                            previous anesthesia was also reviewed. The risks                            and benefits of the procedure and the sedation                            options and risks were discussed with the patient.                            All questions were answered, and informed consent                            was obtained. Prior Anticoagulants: The patient has                            taken no previous anticoagulant or antiplatelet                            agents. ASA Grade Assessment: II - A patient with                            mild systemic disease. After reviewing the risks                            and benefits, the patient was deemed in                            satisfactory condition to undergo the procedure.                           After obtaining informed consent, the colonoscope                            was passed under direct vision. Throughout the  procedure, the patient's blood pressure, pulse, and                            oxygen saturations were monitored continuously. The                            [id number] ([id number]) scope was introduced through                            the anus and advanced to the the cecum, identified                            by appendiceal orifice and ileocecal valve. The                            colonoscopy was performed without difficulty. The                            patient tolerated the procedure well. The quality                            of the bowel preparation was fair and fair but                            repeated irrigation led to a good and adequate                            prep. The terminal ileum, ileocecal valve,                            appendiceal orifice, and rectum were photographed. Scope In: 9:22:59 AM Scope Out: 9:33:11 AM Scope Withdrawal Time: 0 hours 6 minutes 4 seconds  Total Procedure Duration: 0 hours 10 minutes 12  seconds  Findings:      The perianal and digital rectal examinations were normal.      Multiple small and large-mouthed diverticula were found in the entire       colon.      Internal hemorrhoids were found during retroflexion. The hemorrhoids       were small and Grade I (internal hemorrhoids that do not prolapse).      The terminal ileum appeared normal.      A 2 mm polyp was found in the rectum. The polyp was flat. The polyp was       removed with a cold biopsy forceps. Resection and retrieval were       complete. Estimated blood loss was minimal. Impression:               - Preparation of the colon was fair.                           - Diverticulosis in the entire examined colon.                           - Internal hemorrhoids.                           -  The examined portion of the ileum was normal.                           - One 2 mm polyp in the rectum, removed with a cold                            biopsy forceps. Resected and retrieved. Moderate Sedation:      N/A - MAC procedure Recommendation:           - Await pathology results.                           - Repeat colonoscopy is not recommended for                            surveillance.                           - High fiber diet.                           - Patient has a contact number available for                            emergencies. The signs and symptoms of potential                            delayed complications were discussed with the                            patient. Return to normal activities tomorrow.                            Written discharge instructions were provided to the                            patient.                           - Continue present medications. Procedure Code(s):        --- Professional ---                           [phone], Colonoscopy, flexible; with biopsy, single                            or multiple Diagnosis Code(s):        --- Professional ---                            D50.9, Iron deficiency anemia, unspecified                           K62.1, Rectal polyp                           K64.0, First degree hemorrhoids  K57.30, Diverticulosis of large intestine without                            perforation or abscess without bleeding CPT copyright 2016 American Medical Association. All rights reserved. The codes documented in this report are preliminary and upon coder review may  be revised to meet current compliance requirements. Lear Ng, MD 03/24/2017 9:48:52 AM This report has been signed electronically. Number of Addenda: 0

## 2017-03-24 NOTE — Anesthesia Preprocedure Evaluation (Addendum)
Anesthesia Evaluation  Patient identified by MRN, date of birth, ID band Patient awake    Reviewed: Allergy & Precautions, NPO status , Patient's Chart, lab work & pertinent test results  History of Anesthesia Complications (+) PONV  Airway Mallampati: II  TM Distance: >3 FB Neck ROM: Full    Dental  (+) Dental Advisory Given, Upper Dentures, Partial Lower   Pulmonary neg pulmonary ROS,    breath sounds clear to auscultation       Cardiovascular negative cardio ROS   Rhythm:Regular Rate:Normal     Neuro/Psych negative neurological ROS     GI/Hepatic Neg liver ROS, GERD  ,  Endo/Other  diabetes, Type 2, Oral Hypoglycemic Agents, Insulin DependentHypothyroidism   Renal/GU negative Renal ROS     Musculoskeletal  (+) Arthritis ,   Abdominal   Peds  Hematology  (+) anemia ,   Anesthesia Other Findings   Reproductive/Obstetrics                            Lab Results  Component Value Date   WBC 5.4 02/22/2017   HGB 11.3 (L) 02/22/2017   HCT 34.4 (L) 02/22/2017   MCV 77.3 (L) 02/22/2017   PLT 150 02/22/2017   Lab Results  Component Value Date   CREATININE 0.59 02/22/2017   BUN 14 02/22/2017   NA 137 02/22/2017   K 4.0 02/22/2017   CL 102 02/22/2017   CO2 23 02/22/2017    Anesthesia Physical Anesthesia Plan  ASA: II  Anesthesia Plan: MAC   Post-op Pain Management:    Induction: Intravenous  PONV Risk Score and Plan: 3 and Ondansetron, Propofol infusion and Treatment may vary due to age or medical condition  Airway Management Planned: Natural Airway and Simple Face Mask  Additional Equipment:   Intra-op Plan:   Post-operative Plan:   Informed Consent: I have reviewed the patients History and Physical, chart, labs and discussed the procedure including the risks, benefits and alternatives for the proposed anesthesia with the patient or authorized representative who has  indicated his/her understanding and acceptance.     Plan Discussed with: CRNA  Anesthesia Plan Comments:        Anesthesia Quick Evaluation

## 2017-03-24 NOTE — Anesthesia Procedure Notes (Signed)
Procedure Name: MAC Performed by: Valda Favia Pre-anesthesia Checklist: Patient identified, Emergency Drugs available, Suction available, Patient being monitored and Timeout performed Patient Re-evaluated:Patient Re-evaluated prior to induction Oxygen Delivery Method: Nasal cannula Placement Confirmation: positive ETCO2 Dental Injury: Teeth and Oropharynx as per pre-operative assessment

## 2017-03-24 NOTE — Telephone Encounter (Signed)
Received incoming records from [facility] for upcoming appointment on 04/29/17 @ 3:40pm with Dr. [place]. Records given to [facility] in Medical Records. 03/24/17 ab

## 2017-03-24 NOTE — Interval H&P Note (Signed)
History and Physical Interval Note:  03/24/2017 8:53 AM  Julie Norton  has presented today for surgery, with the diagnosis of Anemia  The various methods of treatment have been discussed with the patient and family. After consideration of risks, benefits and other options for treatment, the patient has consented to  Procedure(s): COLONOSCOPY WITH PROPOFOL (N/A) ESOPHAGOGASTRODUODENOSCOPY (EGD) WITH PROPOFOL (N/A) as a surgical intervention .  The patient's history has been reviewed, patient examined, no change in status, stable for surgery.  I have reviewed the patient's chart and labs.  Questions were answered to the patient's satisfaction.     [place] C.

## 2017-03-25 ENCOUNTER — Encounter: Payer: Self-pay | Admitting: Family Medicine

## 2017-03-25 ENCOUNTER — Encounter (HOSPITAL_COMMUNITY): Payer: Self-pay | Admitting: Gastroenterology

## 2017-04-01 ENCOUNTER — Ambulatory Visit: Payer: Medicare Other | Admitting: Cardiology

## 2017-04-05 ENCOUNTER — Telehealth: Payer: Self-pay | Admitting: Family Medicine

## 2017-04-05 MED ORDER — INSULIN DEGLUDEC 100 UNIT/ML ~~LOC~~ SOPN: 14.0000 [IU] | PEN_INJECTOR | Freq: Every day | SUBCUTANEOUS | 3 refills | Status: DC

## 2017-04-05 NOTE — Telephone Encounter (Signed)
Pt will go ahead and get 90 day supply from pharm.

## 2017-04-05 NOTE — Telephone Encounter (Signed)
Pt need new Rx for Tresiba    Pharm:  [facility]

## 2017-04-05 NOTE — Telephone Encounter (Signed)
Pt would like only one month supply not 3 month supply of tresiba

## 2017-04-07 ENCOUNTER — Telehealth: Payer: Self-pay | Admitting: Cardiology

## 2017-04-07 ENCOUNTER — Other Ambulatory Visit: Payer: Self-pay | Admitting: *Deleted

## 2017-04-07 MED ORDER — INSULIN PEN NEEDLE 29G X 5MM MISC: 3 refills | Status: AC

## 2017-04-07 NOTE — Telephone Encounter (Signed)
New message      Calling to see if she is due to have labs drawn.  Please call.

## 2017-04-07 NOTE — Telephone Encounter (Signed)
Spoke w patient. She has been scheduled for a 10/25 OV (overdue 6 mo). Has labwork on file from last OV needed to complete, was entered for [facility]. I advised pt that she may come into our office now to use [facility] lab draw station ([place] affiliate). Instructions for fasting test relayed. Pt voiced understanding and thanks for the call. She knows she may call back if further questions, needs, or concerns.

## 2017-04-08 ENCOUNTER — Ambulatory Visit: Payer: Medicare Other | Admitting: Cardiology

## 2017-04-09 ENCOUNTER — Encounter: Payer: Medicare Other | Admitting: Adult Health

## 2017-04-14 ENCOUNTER — Telehealth: Payer: Self-pay | Admitting: Family Medicine

## 2017-04-14 NOTE — Telephone Encounter (Signed)
Please advise. Thanks.  

## 2017-04-14 NOTE — Telephone Encounter (Signed)
Pt is calling stating that the insulin she thinks if making her feet and ankles swelling in the afternoon and has put on 3-4 pounds.  She would like to know if she should stop taking  the medication (TRESIBA) for a few day or should she just wait and talk with her cardiologist.

## 2017-04-15 ENCOUNTER — Encounter: Payer: Medicare Other | Admitting: Adult Health

## 2017-04-15 NOTE — Telephone Encounter (Signed)
I called the pt and informed her of the message below and she agreed to call back next week.

## 2017-04-15 NOTE — Telephone Encounter (Signed)
I think that would be reasonable.  Julie Norton does not usually cause significant weight gain- but can cause some peripheral edema.  Hold for next 5-7 days to see if edema improves and then let me know.

## 2017-04-16 ENCOUNTER — Encounter: Payer: Self-pay | Admitting: Adult Health

## 2017-04-16 ENCOUNTER — Ambulatory Visit (HOSPITAL_BASED_OUTPATIENT_CLINIC_OR_DEPARTMENT_OTHER): Payer: Medicare Other | Admitting: Adult Health

## 2017-04-16 VITALS — BP 123/61 | HR 71 | Temp 97.7°F | Resp 18 | Ht 61.0 in | Wt 148.5 lb

## 2017-04-16 DIAGNOSIS — Z853 Personal history of malignant neoplasm of breast: Secondary | ICD-10-CM

## 2017-04-16 DIAGNOSIS — D0512 Intraductal carcinoma in situ of left breast: Secondary | ICD-10-CM

## 2017-04-16 NOTE — Progress Notes (Signed)
CLINIC:  Survivorship   REASON FOR VISIT:  Routine follow-up for history of breast cancer.   BRIEF ONCOLOGIC HISTORY:  Per Dr. Geralyn Flash last note in 04/2016 #1 patient underwent a central lumpectomy with removal of the left nipple area LOC complex on 02/24/2011. The final pathology revealed intermediate to high-grade ductal carcinoma in situ. Tumor was ER +99% PR +100%.   #2 patient then received radiation therapy from 04/14/2011 to 05/08/2011   #3 patient declined antiestrogen therapy.   INTERVAL HISTORY:  Julie Norton presents to the Survivorship Clinic today for routine follow-up for her history of breast cancer.  Overall, she reports feeling quite well.  She did have to have a biopsy this year, but it was negative.  She has kept up with her mammograms.  She sees her PCP regularly.  She is not exercising currently.  She is having shoulder problems.      REVIEW OF SYSTEMS:  Review of Systems  Constitutional: Negative for appetite change, chills, fatigue, fever and unexpected weight change.  HENT:   Negative for hearing loss and lump/mass.   Eyes: Negative for eye problems and icterus.  Respiratory: Negative for chest tightness, cough and shortness of breath.   Cardiovascular: Negative for chest pain, leg swelling and palpitations.  Gastrointestinal: Negative for abdominal distention, abdominal pain, constipation, diarrhea, nausea and vomiting.  Endocrine: Negative for hot flashes.  Genitourinary: Negative for difficulty urinating.   Musculoskeletal: Negative for arthralgias.  Skin: Negative for itching and rash.  Neurological: Negative for dizziness and headaches.  Hematological: Negative for adenopathy. Does not bruise/bleed easily.  Psychiatric/Behavioral: Negative for depression. The patient is not nervous/anxious.   Breast: Denies any new nodularity, masses, tenderness, nipple changes, or nipple discharge.       PAST MEDICAL/SURGICAL HISTORY:  Past Medical  History:  Diagnosis Date  . Abdominal adhesions   . Abdominal gas pain    suspected -- may be related to intermittent right upper quadrant discomfort radiating around her back      . Breast mass    left breast  . Cancer ([place])    left breast  . Chest pain    intermittent right upper quadrant discomfort radiating around her back      . Complication of anesthesia   . Diabetes mellitus without complication ([place])    Type II  . Dyspepsia   . GERD (gastroesophageal reflux disease)   . H/O: hysterectomy 1978  . Hypercholesteremia   . Hypothyroidism   . Personal history of radiation therapy   . Pneumonia   . PONV (postoperative nausea and vomiting)   . Tendinitis    of the right arm and shoulder  . Torn tendon    right shoulder   Past Surgical History:  Procedure Laterality Date  . ABDOMINAL HYSTERECTOMY    . APPENDECTOMY  1978  . BLADDER REPAIR    . BREAST LUMPECTOMY Left 02/24/2011   central partial mastectomy - dr Margot Chimes  . BREAST REDUCTION SURGERY    . COLONOSCOPY WITH PROPOFOL N/A 03/24/2017   Procedure: COLONOSCOPY WITH PROPOFOL;  Surgeon: Wilford Corner, MD;  Location: [facility];  Service: Endoscopy;  Laterality: N/A;  . ESOPHAGOGASTRODUODENOSCOPY (EGD) WITH PROPOFOL N/A 03/24/2017   Procedure: ESOPHAGOGASTRODUODENOSCOPY (EGD) WITH PROPOFOL;  Surgeon: Wilford Corner, MD;  Location: [facility];  Service: Endoscopy;  Laterality: N/A;  . LIPOMA EXCISION Right   . REDUCTION MAMMAPLASTY Bilateral 1998  . RETINAL DETACHMENT SURGERY Left   . SALPINGOOPHORECTOMY    . TONSILLECTOMY AND ADENOIDECTOMY  1950   at 83 years of age     ALLERGIES:  Allergies  Allergen Reactions  . Bactrim Nausea And Vomiting  . Dilaudid [Hydromorphone Hcl]   . Hydrocodone Nausea And Vomiting  . Invokana [Canagliflozin] Itching and Other (See Comments)    Yeast Infections   . Macrodantin Nausea And Vomiting     CURRENT MEDICATIONS:  Outpatient Encounter Prescriptions as of  04/16/2017  Medication Sig  . atorvastatin (LIPITOR) 10 MG tablet TAKE ONE-HALF TABLET BY  MOUTH EVERY OTHER DAY OR AS DIRECTED  . Calcium Carbonate-Vitamin D (CALCIUM 600+D) 600-200 MG-UNIT TABS Take 2 tablets by mouth daily.   . Cholecalciferol (VITAMIN D PO) Take 1 tablet by mouth daily.   . DENTAGEL 1.1 % GEL dental gel Apply 1 application topically See admin instructions.  Marland Kitchen glucosamine-chondroitin 500-400 MG tablet Take 2 tablets by mouth daily.   Marland Kitchen glucose blood (ONE TOUCH ULTRA TEST) test strip Check once daily. E11.40  . insulin degludec (TRESIBA FLEXTOUCH) 100 UNIT/ML SOPN FlexTouch Pen Inject 0.14 mLs (14 Units total) into the skin daily at 10 pm.  . Insulin Pen Needle 29G X MISC Use once daily  . levothyroxine (SYNTHROID, LEVOTHROID) 50 MCG tablet TAKE 1 TABLET BY MOUTH  DAILY  . metFORMIN (GLUCOPHAGE-XR) 500 MG 24 hr tablet TAKE 1 TABLET BY MOUTH  TWICE A DAY  . omeprazole (PRILOSEC) 20 MG capsule TAKE 1 CAPSULE BY MOUTH TWO TIMES DAILY  . ONE TOUCH ULTRA TEST test strip CHECK ONCE A DAY  . triamcinolone cream (KENALOG) 0.1 % Apply 1 application topically 2 (two) times daily as needed.   Facility-Administered Encounter Medications as of 04/16/2017  Medication  . aspirin chewable tablet 81 mg     ONCOLOGIC FAMILY HISTORY:  Family History  Problem Relation Age of Onset  . Pneumonia Father   . Heart attack Mother   . Hypertension Mother   . Stroke Mother   . Diabetes Mother   . Aortic aneurysm Mother        abdominal aortic aneurysm  . Cancer Mother        bladder  . Aortic aneurysm Sister        abdominal aortic aneurysm     SOCIAL HISTORY:  Julie Norton is married and lives [place] her husband in [place].  She has 2 children and they live in [place] and [place].  Julie Norton is currently working part time at [employer].  She denies any current or history of tobacco, alcohol, or illicit drug use.     PHYSICAL EXAMINATION:    Vital Signs: Vitals:   04/16/17 1502  BP: 123/61  Pulse: 71  Resp: 18  Temp: 97.7 F (36.5 C)  SpO2: 98%   Filed Weights   04/16/17 1502  Weight: 148 lb 8 oz (67.4 kg)   General: Well-nourished, well-appearing female in no acute distress.  Accompanied by her husband today.   HEENT: Head is normocephalic.  Pupils equal and reactive to light. Conjunctivae clear without exudate.  Sclerae anicteric. Oral mucosa is pink, moist.  Oropharynx is pink without lesions or erythema.  Lymph: No cervical, supraclavicular, or infraclavicular lymphadenopathy noted on palpation.  Cardiovascular: Regular rate and rhythm.Marland Kitchen Respiratory: Clear to auscultation bilaterally. Chest expansion symmetric; breathing non-labored.  Breast Exam:  -Left breast: No appreciable masses on palpation. No skin redness, thickening, or peau d'orange appearance; nipple is surgically absent; mild distortion in symmetry at previous lumpectomy site well healed scar without erythema or  nodularity.  -Right breast: No appreciable masses on palpation. No skin redness, thickening, or peau d'orange appearance; no nipple retraction or nipple discharge;. -Axilla: No axillary adenopathy bilaterally.  GI: Abdomen soft and round; non-tender, non-distended. Bowel sounds normoactive. No hepatosplenomegaly.   GU: Deferred.  Neuro: No focal deficits. Steady gait.  Psych: Mood and affect normal and appropriate for situation.  MSK: No focal spinal tenderness to palpation, full range of motion in bilateral upper extremities Extremities: No edema. Skin: Warm and dry.  LABORATORY DATA:  None for this visit   DIAGNOSTIC IMAGING:  Most recent mammogram:       ASSESSMENT AND PLAN:  Ms.. Norton is a pleasant 83 y.o. female with history of Stage 0 left breast invasive ductal carcinoma, ER+/PR+/HER2-, diagnosed in 01/2011, treated with lumpectomy,  And adjuvant radiation therapy (declined anti estrogen therapy).  She presents to the  Survivorship Clinic for surveillance and routine follow-up.   1. History of breast cancer:  Julie Norton is currently clinically and radiographically without evidence of disease or recurrence of breast cancer. She will be due for mammogram in 02/2018; orders placed today.  We discussed that since she is this far out, if she is comfortable having her breast exams annually with her PCP and continuing with mammograms that would be fine from a surveillance standpoint.  She is thinking about this and will let me know.    2. Bone health:  Given Julie Norton's age, history of breast cancer, she is at risk for bone demineralization. I will defer to her PCP regarding bone density testing and management.  She was given education on specific food and activities to promote bone health.  3. Cancer screening:  Due to Julie Norton's history and her age, she should receive screening for skin cancers, colon cancer. She was encouraged to follow-up with her PCP for appropriate cancer screenings.   4. Health maintenance and wellness promotion: Julie Norton was encouraged to consume 5-7 servings of fruits and vegetables per day. She was also encouraged to engage in moderate to vigorous exercise for 30 minutes per day most days of the week. She was instructed to limit her alcohol consumption and continue to abstain from tobacco use.     Dispo:  -Return to cancer center one year for LTS follow up (if she decides to) -Mammogram in 02/2018   A total of (20) minutes of face-to-face time was spent with this patient with greater than 50% of that time in counseling and care-coordination.   Gardenia Phlegm, NP Survivorship Program [facility] [phone]   Note: PRIMARY CARE PROVIDER Eulas Post, [place] [phone]

## 2017-04-19 ENCOUNTER — Telehealth: Payer: Self-pay

## 2017-04-19 NOTE — Telephone Encounter (Signed)
No los. Per 10/12 los

## 2017-04-21 ENCOUNTER — Telehealth: Payer: Self-pay | Admitting: Family Medicine

## 2017-04-21 NOTE — Telephone Encounter (Signed)
Spoke with patient and she will follow up as needed.

## 2017-04-21 NOTE — Telephone Encounter (Signed)
Unfortunately, peripheral edema can be seen with any insulin. I recommend 1 more trial of starting the Tresiba. If she has peripheral edema with that let us know we will have to discontinue

## 2017-04-21 NOTE — Telephone Encounter (Signed)
Pt is calling to let md know she stopped the tresiba and the swelling has improved. Pt would like to know should she restart the med or what.please advise

## 2017-04-22 ENCOUNTER — Ambulatory Visit (INDEPENDENT_AMBULATORY_CARE_PROVIDER_SITE_OTHER): Payer: Medicare Other | Admitting: *Deleted

## 2017-04-22 DIAGNOSIS — Z23 Encounter for immunization: Secondary | ICD-10-CM

## 2017-04-24 NOTE — Progress Notes (Signed)
Cardiology Office Note   Date:  04/29/2017   ID:  Julie Norton, DOB 02-15-1930, MRN [id number]  PCP:  Eulas Post, MD  Cardiologist: Edy Belt Martinique MD  Chief Complaint  Patient presents with  . Follow-up  . Edema      History of Present Illness: Julie Norton is a 83 y.o. female who presents for follow up HLD and RBBB.    She has a past history of hypercholesterolemia. She also has a history of DM managed with oral agents but is now on insulin. Echo in 2015 showed mild MR with normal LV function. She had a normal Myoview study in November 2017.   She did undergo GI evaluation in September for iron deficiency anemia. She had a small hiatal hernia and diverticulosis. One small rectal polyp.   On follow up today she notes increased LE edema. She noticed this after starting on insulin. She held insuliin for a couple of weeks and edema did improve. She is now back on insulin. She denies any dyspnea or chest pain. No orthopnea or PND.   History of intolerance to higher dose statins.   Past Medical History:  Diagnosis Date  . Abdominal adhesions   . Abdominal gas pain    suspected -- may be related to intermittent right upper quadrant discomfort radiating around her back      . Breast mass    left breast  . Cancer ([place])    left breast  . Chest pain    intermittent right upper quadrant discomfort radiating around her back      . Complication of anesthesia   . Diabetes mellitus without complication ([place])    Type II  . Dyspepsia   . GERD (gastroesophageal reflux disease)   . H/O: hysterectomy 1978  . Hypercholesteremia   . Hypothyroidism   . Personal history of radiation therapy   . Pneumonia   . PONV (postoperative nausea and vomiting)   . Tendinitis    of the right arm and shoulder  . Torn tendon    right shoulder    Past Surgical History:  Procedure Laterality Date  . ABDOMINAL HYSTERECTOMY    . APPENDECTOMY  1978  . BLADDER REPAIR    . BREAST  LUMPECTOMY Left 02/24/2011   central partial mastectomy - dr Margot Chimes  . BREAST REDUCTION SURGERY    . COLONOSCOPY WITH PROPOFOL N/A 03/24/2017   Procedure: COLONOSCOPY WITH PROPOFOL;  Surgeon: Wilford Corner, MD;  Location: [facility];  Service: Endoscopy;  Laterality: N/A;  . ESOPHAGOGASTRODUODENOSCOPY (EGD) WITH PROPOFOL N/A 03/24/2017   Procedure: ESOPHAGOGASTRODUODENOSCOPY (EGD) WITH PROPOFOL;  Surgeon: Wilford Corner, MD;  Location: [facility];  Service: Endoscopy;  Laterality: N/A;  . LIPOMA EXCISION Right   . REDUCTION MAMMAPLASTY Bilateral 1998  . RETINAL DETACHMENT SURGERY Left   . SALPINGOOPHORECTOMY    . TONSILLECTOMY AND ADENOIDECTOMY  1950   at 83 years of age     Current Outpatient Prescriptions  Medication Sig Dispense Refill  . atorvastatin (LIPITOR) 10 MG tablet TAKE ONE-HALF TABLET BY  MOUTH EVERY OTHER DAY OR AS DIRECTED 45 tablet 3  . Calcium Carbonate-Vitamin D (CALCIUM 600+D) 600-200 MG-UNIT TABS Take 2 tablets by mouth daily.     . Cholecalciferol (VITAMIN D PO) Take 1 tablet by mouth daily.     . DENTAGEL 1.1 % GEL dental gel Apply 1 application topically See admin instructions.  3  . glucosamine-chondroitin 500-400 MG tablet Take 2 tablets by mouth daily.     Marland Kitchen  glucose blood (ONE TOUCH ULTRA TEST) test strip Check once daily. E11.40 100 each 11  . insulin degludec (TRESIBA FLEXTOUCH) 100 UNIT/ML SOPN FlexTouch Pen Inject 0.14 mLs (14 Units total) into the skin daily at 10 pm. 5 pen 3  . Insulin Pen Needle 29G X 5MM MISC Use once daily 100 each 3  . levothyroxine (SYNTHROID, LEVOTHROID) 50 MCG tablet TAKE 1 TABLET BY MOUTH  DAILY 90 tablet 0  . metFORMIN (GLUCOPHAGE-XR) 500 MG 24 hr tablet TAKE 1 TABLET BY MOUTH  TWICE A DAY 180 tablet 0  . omeprazole (PRILOSEC) 20 MG capsule TAKE 1 CAPSULE BY MOUTH TWO TIMES DAILY 180 capsule 1  . ONE TOUCH ULTRA TEST test strip CHECK ONCE A DAY 50 each 3  . triamcinolone cream (KENALOG) 0.1 % Apply 1 application topically  2 (two) times daily as needed. 30 g 2   Current Facility-Administered Medications  Medication Dose Route Frequency Provider Last Rate Last Dose  . aspirin chewable tablet 81 mg  81 mg Oral Once Martinique, Bearett Porcaro M, MD        Allergies:   Bactrim; Dilaudid [hydromorphone hcl]; Hydrocodone; Invokana [canagliflozin]; and Macrodantin    Social History:  The patient  reports that she has never smoked. She has never used smokeless tobacco. She reports that she does not drink alcohol or use drugs.   Family History:  The patient's family history includes Aortic aneurysm in her mother and sister; Cancer in her mother; Diabetes in her mother; Heart attack in her mother; Hypertension in her mother; Pneumonia in her father; Stroke in her mother.    ROS:  Please see the history of present illness.   Otherwise, review of systems are positive for none.   All other systems are reviewed and negative.    PHYSICAL EXAM: VS:  BP 118/70   Pulse 76   Ht 5\' 1"  (1.549 m)   Wt 148 lb (67.1 kg)   BMI 27.96 kg/m  , BMI Body mass index is 27.96 kg/m. GENERAL:  Well appearing WF in NAD HEENT:  PERRL, EOMI, sclera are clear. Oropharynx is clear. NECK:  No jugular venous distention, carotid upstroke brisk and symmetric, no bruits, no thyromegaly or adenopathy LUNGS:  Clear to auscultation bilaterally CHEST:  Unremarkable HEART:  RRR,  PMI not displaced or sustained,S1 and S2 within normal limits, no S3, no S4: no clicks, no rubs, no murmurs ABD:  Soft, nontender. BS +, no masses or bruits. No hepatomegaly, no splenomegaly EXT:  2 + pulses throughout, 1+ edema, no cyanosis no clubbing SKIN:  Warm and dry.  No rashes NEURO:  Alert and oriented x 3. Cranial nerves II through XII intact. PSYCH:  Cognitively intact     EKG:  EKG is not ordered today.    Recent Labs: 07/30/2016: TSH 1.68 02/22/2017: Hemoglobin 11.3; Platelets 150 04/26/2017: ALT 12; BUN 12; Creatinine, Ser 0.72; Potassium 4.1; Sodium 144     Lipid Panel    Component Value Date/Time   CHOL 135 04/26/2017 0840   TRIG 100 04/26/2017 0840   HDL 45 04/26/2017 0840   CHOLHDL 3.0 04/26/2017 0840   CHOLHDL 5.0 (H) 07/30/2016 0808   VLDL 43 (H) 07/30/2016 0808   LDLCALC 70 04/26/2017 0840      Wt Readings from Last 3 Encounters:  04/29/17 148 lb (67.1 kg)  04/16/17 148 lb 8 oz (67.4 kg)  03/24/17 145 lb (65.8 kg)      Lexiscan myoview 05/26/16:Study Highlights    Nuclear  stress EF: 72%.  There was no ST segment deviation noted during stress.  This is a low risk study.  The left ventricular ejection fraction is hyperdynamic (>65%).   1. Small, mild mid-anteroseptal perfusion defect.  This defect was actually more intense at rest than with stress.  No ischemia.  Suspect attenuation.  2. EF 72% with normal wall motion.  3. Overall low risk study.        ASSESSMENT AND PLAN:  1.  Hypercholesterolemia- Lipids are significantly better. This may be attributed with some improvement in glycemic control. She is at goal. Continue low dose lipitor.   2. History of breast cancer of left breast treated with lumpectomy and radiation therapy  3. Diabetes mellitus- now on insulin. Suspect some of her edema is related to this. Encouraged sodium restriction.    Current medicines are reviewed at length with the patient today.  The patient does not have concerns regarding medicines.  The following changes have been made:  none  Labs/ tests ordered today include: none  Follow up in 6 months.  Signed, Miaa Latterell Martin[place] MD, [facility]   04/29/2017 8:10 PM    [place]

## 2017-04-26 DIAGNOSIS — I4510 Unspecified right bundle-branch block: Secondary | ICD-10-CM | POA: Diagnosis not present

## 2017-04-26 DIAGNOSIS — E7800 Pure hypercholesterolemia, unspecified: Secondary | ICD-10-CM | POA: Diagnosis not present

## 2017-04-26 DIAGNOSIS — E119 Type 2 diabetes mellitus without complications: Secondary | ICD-10-CM | POA: Diagnosis not present

## 2017-04-26 LAB — BASIC METABOLIC PANEL
BUN / CREAT RATIO: 17 (ref 12–28)
BUN: 12 mg/dL (ref 8–27)
CHLORIDE: 106 mmol/L (ref 96–106)
CO2: 21 mmol/L (ref 20–29)
CREATININE: 0.72 mg/dL (ref 0.57–1.00)
Calcium: 9.1 mg/dL (ref 8.7–10.3)
GFR calc Af Amer: 88 mL/min/{1.73_m2} (ref >59)
GFR calc non Af Amer: 77 mL/min/{1.73_m2} (ref >59)
GLUCOSE: 141 mg/dL — AB (ref 65–99)
Potassium: 4.1 mmol/L (ref 3.5–5.2)
SODIUM: 144 mmol/L (ref 134–144)

## 2017-04-26 LAB — LIPID PANEL
CHOL/HDL RATIO: 3 ratio (ref 0.0–4.4)
CHOLESTEROL TOTAL: 135 mg/dL (ref 100–199)
HDL: 45 mg/dL (ref >39)
LDL CALC: 70 mg/dL (ref 0–99)
TRIGLYCERIDES: 100 mg/dL (ref 0–149)
VLDL CHOLESTEROL CAL: 20 mg/dL (ref 5–40)

## 2017-04-26 LAB — HEPATIC FUNCTION PANEL
ALBUMIN: 4.2 g/dL (ref 3.5–4.7)
ALT: 12 IU/L (ref 0–32)
AST: 21 IU/L (ref 0–40)
Alkaline Phosphatase: 64 IU/L (ref 39–117)
BILIRUBIN TOTAL: 0.6 mg/dL (ref 0.0–1.2)
BILIRUBIN, DIRECT: 0.18 mg/dL (ref 0.00–0.40)
TOTAL PROTEIN: 6.2 g/dL (ref 6.0–8.5)

## 2017-04-29 ENCOUNTER — Ambulatory Visit: Payer: Medicare Other | Admitting: Cardiology

## 2017-04-29 ENCOUNTER — Encounter: Payer: Self-pay | Admitting: Cardiology

## 2017-04-29 ENCOUNTER — Ambulatory Visit (INDEPENDENT_AMBULATORY_CARE_PROVIDER_SITE_OTHER): Payer: Medicare Other | Admitting: Cardiology

## 2017-04-29 VITALS — BP 118/70 | HR 76 | Ht 61.0 in | Wt 148.0 lb

## 2017-04-29 DIAGNOSIS — I4510 Unspecified right bundle-branch block: Secondary | ICD-10-CM

## 2017-04-29 DIAGNOSIS — E7800 Pure hypercholesterolemia, unspecified: Secondary | ICD-10-CM

## 2017-04-29 DIAGNOSIS — E119 Type 2 diabetes mellitus without complications: Secondary | ICD-10-CM | POA: Diagnosis not present

## 2017-04-29 NOTE — Patient Instructions (Signed)
Continue your current therapy  I will see you in 6 months.   

## 2017-05-04 ENCOUNTER — Other Ambulatory Visit: Payer: Self-pay | Admitting: Family Medicine

## 2017-05-19 DIAGNOSIS — D509 Iron deficiency anemia, unspecified: Secondary | ICD-10-CM | POA: Diagnosis not present

## 2017-06-16 ENCOUNTER — Encounter: Payer: Self-pay | Admitting: Family Medicine

## 2017-06-17 DIAGNOSIS — H43813 Vitreous degeneration, bilateral: Secondary | ICD-10-CM | POA: Diagnosis not present

## 2017-06-22 ENCOUNTER — Encounter: Payer: Self-pay | Admitting: Family Medicine

## 2017-06-22 ENCOUNTER — Ambulatory Visit (INDEPENDENT_AMBULATORY_CARE_PROVIDER_SITE_OTHER): Payer: Medicare Other | Admitting: Family Medicine

## 2017-06-22 VITALS — BP 140/90 | HR 70 | Temp 97.8°F | Wt 149.0 lb

## 2017-06-22 DIAGNOSIS — E1165 Type 2 diabetes mellitus with hyperglycemia: Secondary | ICD-10-CM

## 2017-06-22 LAB — POCT GLYCOSYLATED HEMOGLOBIN (HGB A1C): HEMOGLOBIN A1C: 7.7

## 2017-06-22 NOTE — Progress Notes (Signed)
Subjective:     Patient ID: Julie Norton, female   DOB: 09-01-1929, 83 y.o.   MRN: [id number]  HPI Patient seen for follow-up type 2 diabetes. History of poor control. Started long-acting insulin last visit and her blood sugars been much improved. She's had some occasional fastings below 100. No hypoglycemia. Also remains on extended release metformin. Currently takes 16 units of Tresiba-once daily. Other medications reviewed.  Past Medical History:  Diagnosis Date  . Abdominal adhesions   . Abdominal gas pain    suspected -- may be related to intermittent right upper quadrant discomfort radiating around her back      . Breast mass    left breast  . Cancer ([place])    left breast  . Chest pain    intermittent right upper quadrant discomfort radiating around her back      . Complication of anesthesia   . Diabetes mellitus without complication ([place])    Type II  . Dyspepsia   . GERD (gastroesophageal reflux disease)   . H/O: hysterectomy 1978  . Hypercholesteremia   . Hypothyroidism   . Personal history of radiation therapy   . Pneumonia   . PONV (postoperative nausea and vomiting)   . Tendinitis    of the right arm and shoulder  . Torn tendon    right shoulder   Past Surgical History:  Procedure Laterality Date  . ABDOMINAL HYSTERECTOMY    . APPENDECTOMY  1978  . BLADDER REPAIR    . BREAST LUMPECTOMY Left 02/24/2011   central partial mastectomy - dr Margot Chimes  . BREAST REDUCTION SURGERY    . COLONOSCOPY WITH PROPOFOL N/A 03/24/2017   Procedure: COLONOSCOPY WITH PROPOFOL;  Surgeon: Wilford Corner, MD;  Location: [facility];  Service: Endoscopy;  Laterality: N/A;  . ESOPHAGOGASTRODUODENOSCOPY (EGD) WITH PROPOFOL N/A 03/24/2017   Procedure: ESOPHAGOGASTRODUODENOSCOPY (EGD) WITH PROPOFOL;  Surgeon: Wilford Corner, MD;  Location: [facility];  Service: Endoscopy;  Laterality: N/A;  . LIPOMA EXCISION Right   . REDUCTION MAMMAPLASTY Bilateral 1998  . RETINAL DETACHMENT  SURGERY Left   . SALPINGOOPHORECTOMY    . TONSILLECTOMY AND ADENOIDECTOMY  1950   at 83 years of age    reports that  has never smoked. she has never used smokeless tobacco. She reports that she does not drink alcohol or use drugs. family history includes Aortic aneurysm in her mother and sister; Cancer in her mother; Diabetes in her mother; Heart attack in her mother; Hypertension in her mother; Pneumonia in her father; Stroke in her mother. Allergies  Allergen Reactions  . Bactrim Nausea And Vomiting  . Dilaudid [Hydromorphone Hcl]   . Hydrocodone Nausea And Vomiting  . Invokana [Canagliflozin] Itching and Other (See Comments)    Yeast Infections   . Macrodantin Nausea And Vomiting     Review of Systems  Constitutional: Negative for fatigue.  Eyes: Negative for visual disturbance.  Respiratory: Negative for cough, chest tightness, shortness of breath and wheezing.   Cardiovascular: Negative for chest pain, palpitations and leg swelling.  Endocrine: Negative for polydipsia and polyuria.  Neurological: Negative for dizziness, seizures, syncope, weakness, light-headedness and headaches.       Objective:   Physical Exam  Constitutional: She appears well-developed and well-nourished.  Eyes: Pupils are equal, round, and reactive to light.  Neck: Neck supple. No JVD present. No thyromegaly present.  Cardiovascular: Normal rate and regular rhythm. Exam reveals no gallop.  Pulmonary/Chest: Effort normal and breath sounds normal. No respiratory distress. She has  no wheezes. She has no rales.  Musculoskeletal: She exhibits no edema.  Neurological: She is alert.  Skin:  Feet reveal no skin lesions. Good distal foot pulses. Good capillary refill. No calluses. Normal sensation with monofilament testing        Assessment:     Type 2 diabetes improved with A1c 7.7% which is down from 9.8% prior to initiating insulin    Plan:     -Continue current regimen. We feel reasonable goal  for her age is less than 8. Further samples of insulin were given today -Routine follow-up in 3 months and sooner as needed  Eulas Post MD [facility] at [facility]

## 2017-06-26 ENCOUNTER — Other Ambulatory Visit: Payer: Self-pay | Admitting: Family Medicine

## 2017-07-10 ENCOUNTER — Other Ambulatory Visit: Payer: Self-pay | Admitting: Family Medicine

## 2017-07-10 DIAGNOSIS — E0590 Thyrotoxicosis, unspecified without thyrotoxic crisis or storm: Secondary | ICD-10-CM

## 2017-07-22 DIAGNOSIS — E119 Type 2 diabetes mellitus without complications: Secondary | ICD-10-CM | POA: Diagnosis not present

## 2017-07-22 DIAGNOSIS — Z961 Presence of intraocular lens: Secondary | ICD-10-CM | POA: Diagnosis not present

## 2017-07-23 LAB — HM DIABETES EYE EXAM

## 2017-07-28 ENCOUNTER — Encounter: Payer: Self-pay | Admitting: Family Medicine

## 2017-08-09 ENCOUNTER — Telehealth: Payer: Self-pay | Admitting: Cardiology

## 2017-08-09 NOTE — Telephone Encounter (Signed)
Pt wants to know should she come in fastening for her appt on 11-01-17?

## 2017-08-09 NOTE — Telephone Encounter (Signed)
Returned the call to the patient. She was calling to see if she needed fasting labs for her appointment on 4/29. Message routed to the provider's nurse for her recommendation. Last labs were in 10/18.

## 2017-08-11 NOTE — Telephone Encounter (Signed)
Returned call to patient advised I will ask Dr.Jordan next week if she needs labs before April appointment.

## 2017-08-17 ENCOUNTER — Other Ambulatory Visit: Payer: Self-pay

## 2017-08-17 DIAGNOSIS — L821 Other seborrheic keratosis: Secondary | ICD-10-CM | POA: Diagnosis not present

## 2017-08-17 DIAGNOSIS — D2262 Melanocytic nevi of left upper limb, including shoulder: Secondary | ICD-10-CM | POA: Diagnosis not present

## 2017-08-17 DIAGNOSIS — D229 Melanocytic nevi, unspecified: Secondary | ICD-10-CM | POA: Diagnosis not present

## 2017-08-17 DIAGNOSIS — L814 Other melanin hyperpigmentation: Secondary | ICD-10-CM | POA: Diagnosis not present

## 2017-08-17 DIAGNOSIS — D485 Neoplasm of uncertain behavior of skin: Secondary | ICD-10-CM | POA: Diagnosis not present

## 2017-08-17 DIAGNOSIS — L819 Disorder of pigmentation, unspecified: Secondary | ICD-10-CM | POA: Diagnosis not present

## 2017-08-20 NOTE — Telephone Encounter (Signed)
Returned call to patient advised she does not need lab work before her next appointment with Dr.Jordan.

## 2017-08-26 ENCOUNTER — Other Ambulatory Visit: Payer: Self-pay

## 2017-08-26 DIAGNOSIS — D485 Neoplasm of uncertain behavior of skin: Secondary | ICD-10-CM | POA: Diagnosis not present

## 2017-08-26 DIAGNOSIS — L988 Other specified disorders of the skin and subcutaneous tissue: Secondary | ICD-10-CM | POA: Diagnosis not present

## 2017-09-20 ENCOUNTER — Encounter: Payer: Self-pay | Admitting: Family Medicine

## 2017-09-20 ENCOUNTER — Ambulatory Visit (INDEPENDENT_AMBULATORY_CARE_PROVIDER_SITE_OTHER): Payer: Medicare Other | Admitting: Family Medicine

## 2017-09-20 VITALS — BP 110/80 | HR 96 | Temp 98.3°F | Wt 152.0 lb

## 2017-09-20 DIAGNOSIS — E1165 Type 2 diabetes mellitus with hyperglycemia: Secondary | ICD-10-CM

## 2017-09-20 DIAGNOSIS — I4891 Unspecified atrial fibrillation: Secondary | ICD-10-CM

## 2017-09-20 DIAGNOSIS — I499 Cardiac arrhythmia, unspecified: Secondary | ICD-10-CM | POA: Diagnosis not present

## 2017-09-20 LAB — CBC WITH DIFFERENTIAL/PLATELET
BASOS ABS: 0 10*3/uL (ref 0.0–0.1)
Basophils Relative: 0.3 % (ref 0.0–3.0)
EOS PCT: 2.4 % (ref 0.0–5.0)
Eosinophils Absolute: 0.1 10*3/uL (ref 0.0–0.7)
HEMATOCRIT: 34.3 % — AB (ref 36.0–46.0)
HEMOGLOBIN: 11.5 g/dL — AB (ref 12.0–15.0)
LYMPHS PCT: 27 % (ref 12.0–46.0)
Lymphs Abs: 1.4 10*3/uL (ref 0.7–4.0)
MCHC: 33.5 g/dL (ref 30.0–36.0)
MCV: 77 fl — AB (ref 78.0–100.0)
MONOS PCT: 8.9 % (ref 3.0–12.0)
Monocytes Absolute: 0.5 10*3/uL (ref 0.1–1.0)
Neutro Abs: 3.2 10*3/uL (ref 1.4–7.7)
Neutrophils Relative %: 61.4 % (ref 43.0–77.0)
Platelets: 165 10*3/uL (ref 150.0–400.0)
RBC: 4.45 Mil/uL (ref 3.87–5.11)
RDW: 15.2 % (ref 11.5–15.5)
WBC: 5.1 10*3/uL (ref 4.0–10.5)

## 2017-09-20 LAB — POCT GLYCOSYLATED HEMOGLOBIN (HGB A1C): Hemoglobin A1C: 7.8

## 2017-09-20 LAB — BASIC METABOLIC PANEL
BUN: 14 mg/dL (ref 6–23)
CALCIUM: 9.3 mg/dL (ref 8.4–10.5)
CO2: 23 meq/L (ref 19–32)
Chloride: 108 mEq/L (ref 96–112)
Creatinine, Ser: 0.67 mg/dL (ref 0.40–1.20)
GFR: 88.82 mL/min (ref >60.00)
GLUCOSE: 237 mg/dL — AB (ref 70–99)
POTASSIUM: 4.1 meq/L (ref 3.5–5.1)
SODIUM: 139 meq/L (ref 135–145)

## 2017-09-20 LAB — TSH: TSH: 1.61 u[IU]/mL (ref 0.35–4.50)

## 2017-09-20 MED ORDER — APIXABAN 5 MG PO TABS: 5.0000 mg | ORAL_TABLET | Freq: Two times a day (BID) | ORAL | 2 refills | Status: DC

## 2017-09-20 NOTE — Progress Notes (Signed)
Subjective:     Patient ID: Julie Norton, female   DOB: July 10, 1929, 84 y.o.   MRN: [id number]  HPI Patient here for medical follow-up. She has type 2 diabetes. We added long-acting insulin several months ago. Last A1c 7.7%. She is concerned her blood sugars may be much higher today. Not monitoring regularly. She's had increased stress with family issues over the past year and also less exercise. Poor compliance with diet.  No hypoglycemic symptoms.  Her lipids and chemistries were checked in the fall and stable.  She has had some recent fatigue issues. Occasional fleeting atypical chest pain. No dizziness. No dyspnea. No syncope. Patient had Lexiscan Myoview 05/26/16 with nuclear stress EF 72% and low risk study.  Past Medical History:  Diagnosis Date  . Abdominal adhesions   . Abdominal gas pain    suspected -- may be related to intermittent right upper quadrant discomfort radiating around her back      . Breast mass    left breast  . Cancer ([place])    left breast  . Chest pain    intermittent right upper quadrant discomfort radiating around her back      . Complication of anesthesia   . Diabetes mellitus without complication ([place])    Type II  . Dyspepsia   . GERD (gastroesophageal reflux disease)   . H/O: hysterectomy 1978  . Hypercholesteremia   . Hypothyroidism   . Personal history of radiation therapy   . Pneumonia   . PONV (postoperative nausea and vomiting)   . Tendinitis    of the right arm and shoulder  . Torn tendon    right shoulder   Past Surgical History:  Procedure Laterality Date  . ABDOMINAL HYSTERECTOMY    . APPENDECTOMY  1978  . BLADDER REPAIR    . BREAST LUMPECTOMY Left 02/24/2011   central partial mastectomy - dr Margot Chimes  . BREAST REDUCTION SURGERY    . COLONOSCOPY WITH PROPOFOL N/A 03/24/2017   Procedure: COLONOSCOPY WITH PROPOFOL;  Surgeon: Wilford Corner, MD;  Location: [facility];  Service: Endoscopy;  Laterality: N/A;  .  ESOPHAGOGASTRODUODENOSCOPY (EGD) WITH PROPOFOL N/A 03/24/2017   Procedure: ESOPHAGOGASTRODUODENOSCOPY (EGD) WITH PROPOFOL;  Surgeon: Wilford Corner, MD;  Location: [facility];  Service: Endoscopy;  Laterality: N/A;  . LIPOMA EXCISION Right   . REDUCTION MAMMAPLASTY Bilateral 1998  . RETINAL DETACHMENT SURGERY Left   . SALPINGOOPHORECTOMY    . TONSILLECTOMY AND ADENOIDECTOMY  1950   at 84 years of age    reports that  has never smoked. she has never used smokeless tobacco. She reports that she does not drink alcohol or use drugs. family history includes Aortic aneurysm in her mother and sister; Cancer in her mother; Diabetes in her mother; Heart attack in her mother; Hypertension in her mother; Pneumonia in her father; Stroke in her mother. Allergies  Allergen Reactions  . Bactrim Nausea And Vomiting  . Dilaudid [Hydromorphone Hcl]   . Hydrocodone Nausea And Vomiting  . Invokana [Canagliflozin] Itching and Other (See Comments)    Yeast Infections   . Macrodantin Nausea And Vomiting     Review of Systems  Constitutional: Positive for fatigue. Negative for chills and fever.  Eyes: Negative for visual disturbance.  Respiratory: Negative for cough, chest tightness, shortness of breath and wheezing.   Cardiovascular: Negative for palpitations.  Gastrointestinal: Negative for abdominal pain.  Endocrine: Negative for polydipsia and polyuria.  Genitourinary: Negative for dysuria.  Neurological: Negative for dizziness, seizures, syncope, weakness,  light-headedness and headaches.       Objective:   Physical Exam  Constitutional: She appears well-developed and well-nourished.  Eyes: Pupils are equal, round, and reactive to light.  Neck: Neck supple. No JVD present. No thyromegaly present.  Cardiovascular: Normal rate. Exam reveals no gallop.  Irregular rhythm  Pulmonary/Chest: Effort normal and breath sounds normal. No respiratory distress. She has no wheezes. She has no rales.   Musculoskeletal: She exhibits no edema.  Neurological: She is alert.       Assessment:     #1 type 2 diabetes. History of poor control. A1c today 7.8%  #2 irregular heart rhythm. Rule out atrial fibrillation.  EKG shows right bundle branch block (which she's had in the past) and probable atrial fibrillation with rate 95.  Her CHA2DS2-VAS score would be 4    Plan:     -Tighten up diet and increase exercise and reassess A1c in 3 months -Check EKG-as above -Repeat labs with TSH, CBC, basic metabolic panel -We discussed initiating eliquis 5 mg twice a day. She is over 80 but has cr < 1.5 and also over 60 kg body weight -Will get her back into see cardiology soon for their input -We decided not to add beta blocker anything for rate control today since her rate is fairly stable but will defer to cardiology  Eulas Post MD [facility] at [facility]

## 2017-09-20 NOTE — Patient Instructions (Signed)
Atrial Fibrillation Atrial fibrillation is a type of irregular or rapid heartbeat (arrhythmia). In atrial fibrillation, the heart quivers continuously in a chaotic pattern. This occurs when parts of the heart receive disorganized signals that make the heart unable to pump blood normally. This can increase the risk for stroke, heart failure, and other heart-related conditions. There are different types of atrial fibrillation, including:  Paroxysmal atrial fibrillation. This type starts suddenly, and it usually stops on its own shortly after it starts.  Persistent atrial fibrillation. This type often lasts longer than a week. It may stop on its own or with treatment.  Long-lasting persistent atrial fibrillation. This type lasts longer than 12 months.  Permanent atrial fibrillation. This type does not go away.  Talk with your health care provider to learn about the type of atrial fibrillation that you have. What are the causes? This condition is caused by some heart-related conditions or procedures, including:  A heart attack.  Coronary artery disease.  Heart failure.  Heart valve conditions.  High blood pressure.  Inflammation of the sac that surrounds the heart (pericarditis).  Heart surgery.  Certain heart rhythm disorders, such as Wolf-Parkinson-White syndrome.  Other causes include:  Pneumonia.  Obstructive sleep apnea.  Blockage of an artery in the lungs (pulmonary embolism, or PE).  Lung cancer.  Chronic lung disease.  Thyroid problems, especially if the thyroid is overactive (hyperthyroidism).  Caffeine.  Excessive alcohol use or illegal drug use.  Use of some medicines, including certain decongestants and diet pills.  Sometimes, the cause cannot be found. What increases the risk? This condition is more likely to develop in:  People who are older in age.  People who smoke.  People who have diabetes mellitus.  People who are overweight  (obese).  Athletes who exercise vigorously.  What are the signs or symptoms? Symptoms of this condition include:  A feeling that your heart is beating rapidly or irregularly.  A feeling of discomfort or pain in your chest.  Shortness of breath.  Sudden light-headedness or weakness.  Getting tired easily during exercise.  In some cases, there are no symptoms. How is this diagnosed? Your health care provider may be able to detect atrial fibrillation when taking your pulse. If detected, this condition may be diagnosed with:  An electrocardiogram (ECG).  A Holter monitor test that records your heartbeat patterns over a 24-hour period.  Transthoracic echocardiogram (TTE) to evaluate how blood flows through your heart.  Transesophageal echocardiogram (TEE) to view more detailed images of your heart.  A stress test.  Imaging tests, such as a CT scan or chest X-ray.  Blood tests.  How is this treated? The main goals of treatment are to prevent blood clots from forming and to keep your heart beating at a normal rate and rhythm. The type of treatment that you receive depends on many factors, such as your underlying medical conditions and how you feel when you are experiencing atrial fibrillation. This condition may be treated with:  Medicine to slow down the heart rate, bring the heart's rhythm back to normal, or prevent clots from forming.  Electrical cardioversion. This is a procedure that resets your heart's rhythm by delivering a controlled, low-energy shock to the heart through your skin.  Different types of ablation, such as catheter ablation, catheter ablation with pacemaker, or surgical ablation. These procedures destroy the heart tissues that send abnormal signals. When the pacemaker is used, it is placed under your skin to help your heart beat in   a regular rhythm.  Follow these instructions at home:  Take over-the counter and prescription medicines only as told by your  health care provider.  If your health care provider prescribed a blood-thinning medicine (anticoagulant), take it exactly as told. Taking too much blood-thinning medicine can cause bleeding. If you do not take enough blood-thinning medicine, you will not have the protection that you need against stroke and other problems.  Do not use tobacco products, including cigarettes, chewing tobacco, and e-cigarettes. If you need help quitting, ask your health care provider.  If you have obstructive sleep apnea, manage your condition as told by your health care provider.  Do not drink alcohol.  Do not drink beverages that contain caffeine, such as coffee, soda, and tea.  Maintain a healthy weight. Do not use diet pills unless your health care provider approves. Diet pills may make heart problems worse.  Follow diet instructions as told by your health care provider.  Exercise regularly as told by your health care provider.  Keep all follow-up visits as told by your health care provider. This is important. How is this prevented?  Avoid drinking beverages that contain caffeine or alcohol.  Avoid certain medicines, especially medicines that are used for breathing problems.  Avoid certain herbs and herbal medicines, such as those that contain ephedra or ginseng.  Do not use illegal drugs, such as cocaine and amphetamines.  Do not smoke.  Manage your high blood pressure. Contact a health care provider if:  You notice a change in the rate, rhythm, or strength of your heartbeat.  You are taking an anticoagulant and you notice increased bruising.  You tire more easily when you exercise or exert yourself. Get help right away if:  You have chest pain, abdominal pain, sweating, or weakness.  You feel nauseous.  You notice blood in your vomit, bowel movement, or urine.  You have shortness of breath.  You suddenly have swollen feet and ankles.  You feel dizzy.  You have sudden weakness or  numbness of the face, arm, or leg, especially on one side of the body.  You have trouble speaking, trouble understanding, or both (aphasia).  Your face or your eyelid droops on one side. These symptoms may represent a serious problem that is an emergency. Do not wait to see if the symptoms will go away. Get medical help right away. Call your local emergency services (911 in the [place] Do not drive yourself to the hospital. This information is not intended to replace advice given to you by your health care provider. Make sure you discuss any questions you have with your health care provider. Document Released: 06/22/2005 Document Revised: 10/30/2015 Document Reviewed: 10/17/2014 Elsevier Interactive Patient Education  2018 [employer]  

## 2017-09-21 ENCOUNTER — Telehealth: Payer: Self-pay

## 2017-09-21 DIAGNOSIS — I4891 Unspecified atrial fibrillation: Secondary | ICD-10-CM

## 2017-09-21 NOTE — Telephone Encounter (Signed)
Spoke to patient about message I received from [place].He received message from [place] yesterday about being in afib.He advised you need a echo and follow up visit with him or a extender.Scheduler will call back with appointments.

## 2017-09-24 ENCOUNTER — Other Ambulatory Visit: Payer: Self-pay

## 2017-09-24 ENCOUNTER — Ambulatory Visit (HOSPITAL_COMMUNITY): Payer: Medicare Other | Attending: Cardiovascular Disease

## 2017-09-24 DIAGNOSIS — I4510 Unspecified right bundle-branch block: Secondary | ICD-10-CM | POA: Diagnosis not present

## 2017-09-24 DIAGNOSIS — Z9013 Acquired absence of bilateral breasts and nipples: Secondary | ICD-10-CM | POA: Diagnosis not present

## 2017-09-24 DIAGNOSIS — I517 Cardiomegaly: Secondary | ICD-10-CM | POA: Diagnosis not present

## 2017-09-24 DIAGNOSIS — I340 Nonrheumatic mitral (valve) insufficiency: Secondary | ICD-10-CM | POA: Diagnosis not present

## 2017-09-24 DIAGNOSIS — Z923 Personal history of irradiation: Secondary | ICD-10-CM | POA: Insufficient documentation

## 2017-09-24 DIAGNOSIS — Z853 Personal history of malignant neoplasm of breast: Secondary | ICD-10-CM | POA: Insufficient documentation

## 2017-09-24 DIAGNOSIS — E785 Hyperlipidemia, unspecified: Secondary | ICD-10-CM | POA: Diagnosis not present

## 2017-09-24 DIAGNOSIS — I4891 Unspecified atrial fibrillation: Secondary | ICD-10-CM | POA: Insufficient documentation

## 2017-09-24 DIAGNOSIS — E119 Type 2 diabetes mellitus without complications: Secondary | ICD-10-CM | POA: Diagnosis not present

## 2017-09-24 DIAGNOSIS — Z8249 Family history of ischemic heart disease and other diseases of the circulatory system: Secondary | ICD-10-CM | POA: Diagnosis not present

## 2017-09-27 ENCOUNTER — Other Ambulatory Visit: Payer: Self-pay | Admitting: Family Medicine

## 2017-09-27 ENCOUNTER — Other Ambulatory Visit: Payer: Self-pay

## 2017-09-27 DIAGNOSIS — E0590 Thyrotoxicosis, unspecified without thyrotoxic crisis or storm: Secondary | ICD-10-CM

## 2017-09-27 MED ORDER — FUROSEMIDE 20 MG PO TABS: 20.0000 mg | ORAL_TABLET | Freq: Every day | ORAL | 6 refills | Status: DC

## 2017-09-29 NOTE — H&P (View-Only) (Signed)
Cardiology Office Note   Date:  09/30/2017   ID:  Julie Norton, DOB February 19, 1930, MRN [id number]  PCP:  Eulas Post, MD  Cardiologist: Wednesday Ericsson Martinique MD  Chief Complaint  Patient presents with  . Follow-up  . Atrial Fibrillation      History of Present Illness: Julie Norton is a 84 y.o. female who presents for evaluation of Afib- new onset.   She has a past history of hypercholesterolemia. She also has a history of DM managed with oral agents but is now on insulin. Echo in 2015 showed mild MR with normal LV function. She had a normal Myoview study in November 2017.   She did undergo GI evaluation in September 2018  for iron deficiency anemia. She had a small hiatal hernia and diverticulosis. One small rectal polyp.   She was seen by Dr. Elease Hashimoto on 09/20/17. Found to be in Afib with rate 95. Started on Eliquis. Follow up Echo performed and she is seen today for evaluation of AFib. Since then she does note some fatigue, decreased energy and leg swelling. Some dyspnea and chest tightness. Was started on lasix with improvement of these symptoms. Still doesn't feel normal. Cannot really tell when Afib started.   Past Medical History:  Diagnosis Date  . Abdominal adhesions   . Abdominal gas pain    suspected -- may be related to intermittent right upper quadrant discomfort radiating around her back      . Breast mass    left breast  . Cancer ([place])    left breast  . Chest pain    intermittent right upper quadrant discomfort radiating around her back      . Complication of anesthesia   . Diabetes mellitus without complication ([place])    Type II  . Dyspepsia   . GERD (gastroesophageal reflux disease)   . H/O: hysterectomy 1978  . Hypercholesteremia   . Hypothyroidism   . Personal history of radiation therapy   . Pneumonia   . PONV (postoperative nausea and vomiting)   . Tendinitis    of the right arm and shoulder  . Torn tendon    right shoulder    Past  Surgical History:  Procedure Laterality Date  . ABDOMINAL HYSTERECTOMY    . APPENDECTOMY  1978  . BLADDER REPAIR    . BREAST LUMPECTOMY Left 02/24/2011   central partial mastectomy - dr Margot Chimes  . BREAST REDUCTION SURGERY    . COLONOSCOPY WITH PROPOFOL N/A 03/24/2017   Procedure: COLONOSCOPY WITH PROPOFOL;  Surgeon: Wilford Corner, MD;  Location: [facility];  Service: Endoscopy;  Laterality: N/A;  . ESOPHAGOGASTRODUODENOSCOPY (EGD) WITH PROPOFOL N/A 03/24/2017   Procedure: ESOPHAGOGASTRODUODENOSCOPY (EGD) WITH PROPOFOL;  Surgeon: Wilford Corner, MD;  Location: [facility];  Service: Endoscopy;  Laterality: N/A;  . LIPOMA EXCISION Right   . REDUCTION MAMMAPLASTY Bilateral 1998  . RETINAL DETACHMENT SURGERY Left   . SALPINGOOPHORECTOMY    . TONSILLECTOMY AND ADENOIDECTOMY  1950   at 84 years of age     Current Outpatient Medications  Medication Sig Dispense Refill  . apixaban (ELIQUIS) 5 MG TABS tablet Take 1 tablet (5 mg total) by mouth 2 (two) times daily. 60 tablet 2  . atorvastatin (LIPITOR) 10 MG tablet TAKE ONE-HALF TABLET BY  MOUTH EVERY OTHER DAY OR AS DIRECTED 45 tablet 3  . Calcium Carbonate-Vitamin D (CALCIUM 600+D) 600-200 MG-UNIT TABS Take 2 tablets by mouth daily.     . Cholecalciferol (VITAMIN D  PO) Take 1 tablet by mouth daily.     . DENTAGEL 1.1 % GEL dental gel Apply 1 application topically See admin instructions.  3  . furosemide (LASIX) 20 MG tablet Take 1 tablet (20 mg total) by mouth daily. 30 tablet 6  . glucosamine-chondroitin 500-400 MG tablet Take 2 tablets by mouth daily.     Marland Kitchen glucose blood (ONE TOUCH ULTRA TEST) test strip Check once daily. E11.40 100 each 11  . insulin degludec (TRESIBA FLEXTOUCH) 100 UNIT/ML SOPN FlexTouch Pen Inject 0.14 mLs (14 Units total) into the skin daily at 10 pm. (Patient taking differently: Inject 16 Units into the skin daily at 10 pm. ) 5 pen 3  . Insulin Pen Needle 29G X 5MM MISC Use once daily 100 each 3  . levothyroxine  (SYNTHROID, LEVOTHROID) 50 MCG tablet TAKE 1 TABLET BY MOUTH  DAILY 90 tablet 0  . metFORMIN (GLUCOPHAGE-XR) 500 MG 24 hr tablet TAKE 1 TABLET BY MOUTH  TWICE A DAY 180 tablet 1  . omeprazole (PRILOSEC) 20 MG capsule TAKE 1 CAPSULE BY MOUTH TWO TIMES DAILY 180 capsule 1  . ONE TOUCH ULTRA TEST test strip CHECK ONCE A DAY 50 each 3  . triamcinolone cream (KENALOG) 0.1 % Apply 1 application topically 2 (two) times daily as needed. 30 g 2   Current Facility-Administered Medications  Medication Dose Route Frequency Provider Last Rate Last Dose  . aspirin chewable tablet 81 mg  81 mg Oral Once Martinique, Holly Iannaccone M, MD        Allergies:   Bactrim; Dilaudid [hydromorphone hcl]; Hydrocodone; Invokana [canagliflozin]; and Macrodantin    Social History:  The patient  reports that she has never smoked. She has never used smokeless tobacco. She reports that she does not drink alcohol or use drugs.   Family History:  The patient's family history includes Aortic aneurysm in her mother and sister; Cancer in her mother; Diabetes in her mother; Heart attack in her mother; Hypertension in her mother; Pneumonia in her father; Stroke in her mother.    ROS:  Please see the history of present illness.   Otherwise, review of systems are positive for none.   All other systems are reviewed and negative.    PHYSICAL EXAM: VS:  BP 110/62   Pulse 92   Ht 5\' 1"  (1.549 m)   Wt 151 lb (68.5 kg)   BMI 28.53 kg/m  , BMI Body mass index is 28.53 kg/m. GENERAL:  Well appearing WF in NAD HEENT:  PERRL, EOMI, sclera are clear. Oropharynx is clear. NECK:  No jugular venous distention, carotid upstroke brisk and symmetric, no bruits, no thyromegaly or adenopathy LUNGS:  Clear to auscultation bilaterally CHEST:  Unremarkable HEART:  IRRR,  PMI not displaced or sustained,S1 and S2 within normal limits, no S3, no S4: no clicks, no rubs, no murmurs ABD:  Soft, nontender. BS +, no masses or bruits. No hepatomegaly, no  splenomegaly EXT:  2 + pulses throughout, 1+ edema, no cyanosis no clubbing SKIN:  Warm and dry.  No rashes NEURO:  Alert and oriented x 3. Cranial nerves II through XII intact. PSYCH:  Cognitively intact     EKG:  EKG is  ordered today. Afib with rate 92. RBBB.  I have personally reviewed and interpreted this study.    Recent Labs: 04/26/2017: ALT 12 09/20/2017: BUN 14; Creatinine, Ser 0.67; Hemoglobin 11.5; Platelets 165.0; Potassium 4.1; Sodium 139; TSH 1.61    Lipid Panel    Component  Value Date/Time   CHOL 135 04/26/2017 0840   TRIG 100 04/26/2017 0840   HDL 45 04/26/2017 0840   CHOLHDL 3.0 04/26/2017 0840   CHOLHDL 5.0 (H) 07/30/2016 0808   VLDL 43 (H) 07/30/2016 0808   LDLCALC 70 04/26/2017 0840      Wt Readings from Last 3 Encounters:  09/30/17 151 lb (68.5 kg)  09/20/17 152 lb (68.9 kg)  06/22/17 149 lb (67.6 kg)      Lexiscan myoview 05/26/16:Study Highlights    Nuclear stress EF: 72%.  There was no ST segment deviation noted during stress.  This is a low risk study.  The left ventricular ejection fraction is hyperdynamic (>65%).   1. Small, mild mid-anteroseptal perfusion defect.  This defect was actually more intense at rest than with stress.  No ischemia.  Suspect attenuation.  2. EF 72% with normal wall motion.  3. Overall low risk study.     Echo 09/24/17: Study Conclusions  - Left ventricle: The cavity size was normal. Systolic function was   normal. The estimated ejection fraction was in the range of 55%   to 60%. Wall motion was normal; there were no regional wall   motion abnormalities. - Mitral valve: There was mild regurgitation. - Left atrium: The atrium was mildly dilated. - Right atrium: The atrium was mildly dilated. - Atrial septum: No defect or patent foramen ovale was identified. - Pulmonary arteries: Systolic pressure was mildly increased. PA   peak pressure: 37 mm Hg (S).    ASSESSMENT AND PLAN:  1. Atrial  fibrillation. New onset. Persistent. [place] vasc score of 4. Now on Eliquis at appropriate dose of 5 mg bid. Will need anticoagulation chronically. Rate is controlled on no AV nodal blocking drugs. She is symptomatic but symptoms improved on lasix. Echo looks good. Recommend DCCV in 2.5 weeks (total of 4 weeks on anticoagulation). Procedure and risks explained in detail.   2.  Hypercholesterolemia-  She is at goal. Continue low dose lipitor.   3. History of breast cancer of left breast treated with lumpectomy and radiation therapy  4. Diabetes mellitus- now on insulin.  Encouraged sodium restriction.    Follow up in 4 weeks  Signed, Chelcie Estorga Martinique MD, [facility]   09/30/2017 3:22 PM    [place]

## 2017-09-29 NOTE — Progress Notes (Signed)
Cardiology Office Note   Date:  09/30/2017   ID:  Julie Norton, DOB 1929-07-17, MRN [id number]  PCP:  Eulas Post, MD  Cardiologist: Peter Martinique MD  Chief Complaint  Patient presents with  . Follow-up  . Atrial Fibrillation      History of Present Illness: Julie Norton is a 84 y.o. female who presents for evaluation of Afib- new onset.   She has a past history of hypercholesterolemia. She also has a history of DM managed with oral agents but is now on insulin. Echo in 2015 showed mild MR with normal LV function. She had a normal Myoview study in November 2017.   She did undergo GI evaluation in September 2018  for iron deficiency anemia. She had a small hiatal hernia and diverticulosis. One small rectal polyp.   She was seen by Dr. Elease Norton on 09/20/17. Found to be in Afib with rate 95. Started on Eliquis. Follow up Echo performed and she is seen today for evaluation of AFib. Since then she does note some fatigue, decreased energy and leg swelling. Some dyspnea and chest tightness. Was started on lasix with improvement of these symptoms. Still doesn't feel normal. Cannot really tell when Afib started.   Past Medical History:  Diagnosis Date  . Abdominal adhesions   . Abdominal gas pain    suspected -- may be related to intermittent right upper quadrant discomfort radiating around her back      . Breast mass    left breast  . Cancer ([place])    left breast  . Chest pain    intermittent right upper quadrant discomfort radiating around her back      . Complication of anesthesia   . Diabetes mellitus without complication ([place])    Type II  . Dyspepsia   . GERD (gastroesophageal reflux disease)   . H/O: hysterectomy 1978  . Hypercholesteremia   . Hypothyroidism   . Personal history of radiation therapy   . Pneumonia   . PONV (postoperative nausea and vomiting)   . Tendinitis    of the right arm and shoulder  . Torn tendon    right shoulder    Past  Surgical History:  Procedure Laterality Date  . ABDOMINAL HYSTERECTOMY    . APPENDECTOMY  1978  . BLADDER REPAIR    . BREAST LUMPECTOMY Left 02/24/2011   central partial mastectomy - dr Margot Chimes  . BREAST REDUCTION SURGERY    . COLONOSCOPY WITH PROPOFOL N/A 03/24/2017   Procedure: COLONOSCOPY WITH PROPOFOL;  Surgeon: Wilford Corner, MD;  Location: [facility];  Service: Endoscopy;  Laterality: N/A;  . ESOPHAGOGASTRODUODENOSCOPY (EGD) WITH PROPOFOL N/A 03/24/2017   Procedure: ESOPHAGOGASTRODUODENOSCOPY (EGD) WITH PROPOFOL;  Surgeon: Wilford Corner, MD;  Location: [facility];  Service: Endoscopy;  Laterality: N/A;  . LIPOMA EXCISION Right   . REDUCTION MAMMAPLASTY Bilateral 1998  . RETINAL DETACHMENT SURGERY Left   . SALPINGOOPHORECTOMY    . TONSILLECTOMY AND ADENOIDECTOMY  1950   at 84 years of age     Current Outpatient Medications  Medication Sig Dispense Refill  . apixaban (ELIQUIS) 5 MG TABS tablet Take 1 tablet (5 mg total) by mouth 2 (two) times daily. 60 tablet 2  . atorvastatin (LIPITOR) 10 MG tablet TAKE ONE-HALF TABLET BY  MOUTH EVERY OTHER DAY OR AS DIRECTED 45 tablet 3  . Calcium Carbonate-Vitamin D (CALCIUM 600+D) 600-200 MG-UNIT TABS Take 2 tablets by mouth daily.     . Cholecalciferol (VITAMIN D  PO) Take 1 tablet by mouth daily.     . DENTAGEL 1.1 % GEL dental gel Apply 1 application topically See admin instructions.  3  . furosemide (LASIX) 20 MG tablet Take 1 tablet (20 mg total) by mouth daily. 30 tablet 6  . glucosamine-chondroitin 500-400 MG tablet Take 2 tablets by mouth daily.     Marland Kitchen glucose blood (ONE TOUCH ULTRA TEST) test strip Check once daily. E11.40 100 each 11  . insulin degludec (TRESIBA FLEXTOUCH) 100 UNIT/ML SOPN FlexTouch Pen Inject 0.14 mLs (14 Units total) into the skin daily at 10 pm. (Patient taking differently: Inject 16 Units into the skin daily at 10 pm. ) 5 pen 3  . Insulin Pen Needle 29G X 5MM MISC Use once daily 100 each 3  . levothyroxine  (SYNTHROID, LEVOTHROID) 50 MCG tablet TAKE 1 TABLET BY MOUTH  DAILY 90 tablet 0  . metFORMIN (GLUCOPHAGE-XR) 500 MG 24 hr tablet TAKE 1 TABLET BY MOUTH  TWICE A DAY 180 tablet 1  . omeprazole (PRILOSEC) 20 MG capsule TAKE 1 CAPSULE BY MOUTH TWO TIMES DAILY 180 capsule 1  . ONE TOUCH ULTRA TEST test strip CHECK ONCE A DAY 50 each 3  . triamcinolone cream (KENALOG) 0.1 % Apply 1 application topically 2 (two) times daily as needed. 30 g 2   Current Facility-Administered Medications  Medication Dose Route Frequency Provider Last Rate Last Dose  . aspirin chewable tablet 81 mg  81 mg Oral Once Martinique, Peter M, MD        Allergies:   Bactrim; Dilaudid [hydromorphone hcl]; Hydrocodone; Invokana [canagliflozin]; and Macrodantin    Social History:  The patient  reports that she has never smoked. She has never used smokeless tobacco. She reports that she does not drink alcohol or use drugs.   Family History:  The patient's family history includes Aortic aneurysm in her mother and sister; Cancer in her mother; Diabetes in her mother; Heart attack in her mother; Hypertension in her mother; Pneumonia in her father; Stroke in her mother.    ROS:  Please see the history of present illness.   Otherwise, review of systems are positive for none.   All other systems are reviewed and negative.    PHYSICAL EXAM: VS:  BP 110/62   Pulse 92   Ht 5\' 1"  (1.549 m)   Wt 151 lb (68.5 kg)   BMI 28.53 kg/m  , BMI Body mass index is 28.53 kg/m. GENERAL:  Well appearing WF in NAD HEENT:  PERRL, EOMI, sclera are clear. Oropharynx is clear. NECK:  No jugular venous distention, carotid upstroke brisk and symmetric, no bruits, no thyromegaly or adenopathy LUNGS:  Clear to auscultation bilaterally CHEST:  Unremarkable HEART:  IRRR,  PMI not displaced or sustained,S1 and S2 within normal limits, no S3, no S4: no clicks, no rubs, no murmurs ABD:  Soft, nontender. BS +, no masses or bruits. No hepatomegaly, no  splenomegaly EXT:  2 + pulses throughout, 1+ edema, no cyanosis no clubbing SKIN:  Warm and dry.  No rashes NEURO:  Alert and oriented x 3. Cranial nerves II through XII intact. PSYCH:  Cognitively intact     EKG:  EKG is  ordered today. Afib with rate 92. RBBB.  I have personally reviewed and interpreted this study.    Recent Labs: 04/26/2017: ALT 12 09/20/2017: BUN 14; Creatinine, Ser 0.67; Hemoglobin 11.5; Platelets 165.0; Potassium 4.1; Sodium 139; TSH 1.61    Lipid Panel    Component  Value Date/Time   CHOL 135 04/26/2017 0840   TRIG 100 04/26/2017 0840   HDL 45 04/26/2017 0840   CHOLHDL 3.0 04/26/2017 0840   CHOLHDL 5.0 (H) 07/30/2016 0808   VLDL 43 (H) 07/30/2016 0808   LDLCALC 70 04/26/2017 0840      Wt Readings from Last 3 Encounters:  09/30/17 151 lb (68.5 kg)  09/20/17 152 lb (68.9 kg)  06/22/17 149 lb (67.6 kg)      Lexiscan myoview 05/26/16:Study Highlights    Nuclear stress EF: 72%.  There was no ST segment deviation noted during stress.  This is a low risk study.  The left ventricular ejection fraction is hyperdynamic (>65%).   1. Small, mild mid-anteroseptal perfusion defect.  This defect was actually more intense at rest than with stress.  No ischemia.  Suspect attenuation.  2. EF 72% with normal wall motion.  3. Overall low risk study.     Echo 09/24/17: Study Conclusions  - Left ventricle: The cavity size was normal. Systolic function was   normal. The estimated ejection fraction was in the range of 55%   to 60%. Wall motion was normal; there were no regional wall   motion abnormalities. - Mitral valve: There was mild regurgitation. - Left atrium: The atrium was mildly dilated. - Right atrium: The atrium was mildly dilated. - Atrial septum: No defect or patent foramen ovale was identified. - Pulmonary arteries: Systolic pressure was mildly increased. PA   peak pressure: 37 mm Hg (S).    ASSESSMENT AND PLAN:  1. Atrial  fibrillation. New onset. Persistent. [place] vasc score of 4. Now on Eliquis at appropriate dose of 5 mg bid. Will need anticoagulation chronically. Rate is controlled on no AV nodal blocking drugs. She is symptomatic but symptoms improved on lasix. Echo looks good. Recommend DCCV in 2.5 weeks (total of 4 weeks on anticoagulation). Procedure and risks explained in detail.   2.  Hypercholesterolemia-  She is at goal. Continue low dose lipitor.   3. History of breast cancer of left breast treated with lumpectomy and radiation therapy  4. Diabetes mellitus- now on insulin.  Encouraged sodium restriction.    Follow up in 4 weeks  Signed, Peter Martinique MD, [facility]   09/30/2017 3:22 PM    [place]

## 2017-09-30 ENCOUNTER — Ambulatory Visit: Payer: Medicare Other | Admitting: Cardiology

## 2017-09-30 ENCOUNTER — Other Ambulatory Visit: Payer: Self-pay | Admitting: Cardiology

## 2017-09-30 ENCOUNTER — Encounter: Payer: Self-pay | Admitting: Cardiology

## 2017-09-30 VITALS — BP 110/62 | HR 92 | Ht 61.0 in | Wt 151.0 lb

## 2017-09-30 DIAGNOSIS — I4510 Unspecified right bundle-branch block: Secondary | ICD-10-CM | POA: Diagnosis not present

## 2017-09-30 DIAGNOSIS — I4819 Other persistent atrial fibrillation: Secondary | ICD-10-CM

## 2017-09-30 DIAGNOSIS — I4891 Unspecified atrial fibrillation: Secondary | ICD-10-CM | POA: Diagnosis not present

## 2017-09-30 DIAGNOSIS — E7800 Pure hypercholesterolemia, unspecified: Secondary | ICD-10-CM

## 2017-09-30 DIAGNOSIS — E119 Type 2 diabetes mellitus without complications: Secondary | ICD-10-CM | POA: Diagnosis not present

## 2017-09-30 NOTE — Patient Instructions (Signed)
  You are scheduled for a Cardioversion on Wednesday 10/20/17 with Dr.Nahser. Please arrive at the [facility] (Main Entrance A) at [facility]: [address] [place], [place] [address] at 12:30 pm.   DIET: Nothing to eat or drink after midnight except a sip of water with medications (see medication instructions below)  Medication Instructions:  Continue your ELIQUIS You will need to continue your anticoagulant after your procedure until you  are told by your  Provider that it is safe to stop   Labs: TO BE DONE Wednesday 10/13/17 [facility] ( [facility] )            Lab opens 8:00 to 12:00 noon you may eat.   You must have a responsible person to drive you home and stay in the waiting area during your procedure. Failure to do so could result in cancellation.  Bring your insurance cards.  *Special Note: Every effort is made to have your procedure done on time. Occasionally there are emergencies that occur at the hospital that may cause delays. Please be patient if a delay does occur.

## 2017-09-30 NOTE — Addendum Note (Signed)
Addended by: Kathyrn Lass on: 09/30/2017 03:40 PM   Modules accepted: Orders

## 2017-10-01 ENCOUNTER — Telehealth: Payer: Self-pay

## 2017-10-01 NOTE — Telephone Encounter (Signed)
Spoke to patient Julie Norton 09/27/17 she stated she has gained 5 lbs within 2 weeks, she is sob.Swelling in lower legs and feet.Dr.Jordan advised start Lasix 20 mg daily.

## 2017-10-07 ENCOUNTER — Telehealth: Payer: Self-pay | Admitting: Cardiology

## 2017-10-07 NOTE — Telephone Encounter (Signed)
Returned call to patient Dr.Jordan's recommendation given. 

## 2017-10-07 NOTE — Telephone Encounter (Signed)
Received a call from patient she stated she did not sleep last night due to severe pain in right shoulder.Stated she wanted to ask Dr.Jordan if ok to get a steroid injection.Message sent to [place] for advice.

## 2017-10-07 NOTE — Telephone Encounter (Signed)
She can have an injection as long as they are willing to do it on anticoagulation. She cannot interrupt anticoagulation now and for at least 4 weeks post cardioversion.  Babette Stum Martinique MD, [facility]

## 2017-10-11 ENCOUNTER — Telehealth: Payer: Self-pay | Admitting: Cardiology

## 2017-10-11 NOTE — Telephone Encounter (Signed)
New message    Patient wants to know if she can come an day earlier for her lab work.

## 2017-10-11 NOTE — Telephone Encounter (Signed)
Patient is unable to come to get labs on Wednesday. She will come tomorrow instead for her pre cardioversion lab work.

## 2017-10-12 DIAGNOSIS — E119 Type 2 diabetes mellitus without complications: Secondary | ICD-10-CM | POA: Diagnosis not present

## 2017-10-12 DIAGNOSIS — S46001D Unspecified injury of muscle(s) and tendon(s) of the rotator cuff of right shoulder, subsequent encounter: Secondary | ICD-10-CM | POA: Diagnosis not present

## 2017-10-12 DIAGNOSIS — I4510 Unspecified right bundle-branch block: Secondary | ICD-10-CM | POA: Diagnosis not present

## 2017-10-12 DIAGNOSIS — M25511 Pain in right shoulder: Secondary | ICD-10-CM | POA: Diagnosis not present

## 2017-10-12 DIAGNOSIS — E109 Type 1 diabetes mellitus without complications: Secondary | ICD-10-CM | POA: Diagnosis not present

## 2017-10-12 DIAGNOSIS — E7800 Pure hypercholesterolemia, unspecified: Secondary | ICD-10-CM | POA: Diagnosis not present

## 2017-10-12 DIAGNOSIS — S46009A Unspecified injury of muscle(s) and tendon(s) of the rotator cuff of unspecified shoulder, initial encounter: Secondary | ICD-10-CM | POA: Insufficient documentation

## 2017-10-12 DIAGNOSIS — I4891 Unspecified atrial fibrillation: Secondary | ICD-10-CM | POA: Diagnosis not present

## 2017-10-12 LAB — CBC WITH DIFFERENTIAL/PLATELET
BASOS ABS: 0 10*3/uL (ref 0.0–0.2)
Basos: 0 %
EOS (ABSOLUTE): 0.1 10*3/uL (ref 0.0–0.4)
EOS: 2 %
HEMATOCRIT: 35 % (ref 34.0–46.6)
HEMOGLOBIN: 11.2 g/dL (ref 11.1–15.9)
IMMATURE GRANS (ABS): 0 10*3/uL (ref 0.0–0.1)
Immature Granulocytes: 0 %
LYMPHS ABS: 1.6 10*3/uL (ref 0.7–3.1)
LYMPHS: 30 %
MCH: 24.9 pg — AB (ref 26.6–33.0)
MCHC: 32 g/dL (ref 31.5–35.7)
MCV: 78 fL — ABNORMAL LOW (ref 79–97)
Monocytes Absolute: 0.5 10*3/uL (ref 0.1–0.9)
Monocytes: 10 %
NEUTROS ABS: 3.1 10*3/uL (ref 1.4–7.0)
Neutrophils: 58 %
Platelets: 189 10*3/uL (ref 150–379)
RBC: 4.5 x10E6/uL (ref 3.77–5.28)
RDW: 14.5 % (ref 12.3–15.4)
WBC: 5.3 10*3/uL (ref 3.4–10.8)

## 2017-10-12 LAB — PT AND PTT
INR: 1.2 (ref 0.8–1.2)
Prothrombin Time: 12 s (ref 9.1–12.0)
aPTT: 32 s (ref 24–33)

## 2017-10-12 LAB — BASIC METABOLIC PANEL
BUN / CREAT RATIO: 15 (ref 12–28)
BUN: 12 mg/dL (ref 8–27)
CALCIUM: 9.3 mg/dL (ref 8.7–10.3)
CHLORIDE: 102 mmol/L (ref 96–106)
CO2: 24 mmol/L (ref 20–29)
CREATININE: 0.78 mg/dL (ref 0.57–1.00)
GFR, EST AFRICAN AMERICAN: 80 mL/min/{1.73_m2} (ref >59)
GFR, EST NON AFRICAN AMERICAN: 70 mL/min/{1.73_m2} (ref >59)
Glucose: 233 mg/dL — ABNORMAL HIGH (ref 65–99)
Potassium: 4.4 mmol/L (ref 3.5–5.2)
Sodium: 141 mmol/L (ref 134–144)

## 2017-10-20 ENCOUNTER — Ambulatory Visit (HOSPITAL_COMMUNITY)
Admission: RE | Admit: 2017-10-20 | Discharge: 2017-10-20 | Disposition: A | Discharge status: Home / Self Care | Payer: Medicare Other | Source: Ambulatory Visit | Attending: Cardiovascular Disease | Admitting: Cardiovascular Disease

## 2017-10-20 ENCOUNTER — Other Ambulatory Visit: Payer: Self-pay

## 2017-10-20 ENCOUNTER — Encounter (HOSPITAL_COMMUNITY): Payer: Self-pay | Admitting: Certified Registered Nurse Anesthetist

## 2017-10-20 ENCOUNTER — Encounter (HOSPITAL_COMMUNITY)
Admission: RE | Disposition: A | Discharge status: Home / Self Care | Payer: Self-pay | Source: Ambulatory Visit | Attending: Cardiovascular Disease

## 2017-10-20 ENCOUNTER — Encounter (HOSPITAL_COMMUNITY): Payer: Self-pay

## 2017-10-20 DIAGNOSIS — K219 Gastro-esophageal reflux disease without esophagitis: Secondary | ICD-10-CM | POA: Insufficient documentation

## 2017-10-20 DIAGNOSIS — E119 Type 2 diabetes mellitus without complications: Secondary | ICD-10-CM | POA: Diagnosis not present

## 2017-10-20 DIAGNOSIS — I4891 Unspecified atrial fibrillation: Secondary | ICD-10-CM | POA: Diagnosis present

## 2017-10-20 DIAGNOSIS — E039 Hypothyroidism, unspecified: Secondary | ICD-10-CM | POA: Diagnosis not present

## 2017-10-20 DIAGNOSIS — E7800 Pure hypercholesterolemia, unspecified: Secondary | ICD-10-CM | POA: Insufficient documentation

## 2017-10-20 DIAGNOSIS — Z794 Long term (current) use of insulin: Secondary | ICD-10-CM | POA: Insufficient documentation

## 2017-10-20 DIAGNOSIS — I481 Persistent atrial fibrillation: Secondary | ICD-10-CM | POA: Insufficient documentation

## 2017-10-20 DIAGNOSIS — I4819 Other persistent atrial fibrillation: Secondary | ICD-10-CM

## 2017-10-20 DIAGNOSIS — Z885 Allergy status to narcotic agent status: Secondary | ICD-10-CM | POA: Insufficient documentation

## 2017-10-20 DIAGNOSIS — Z5309 Procedure and treatment not carried out because of other contraindication: Secondary | ICD-10-CM | POA: Diagnosis not present

## 2017-10-20 DIAGNOSIS — Z7901 Long term (current) use of anticoagulants: Secondary | ICD-10-CM | POA: Diagnosis not present

## 2017-10-20 DIAGNOSIS — Z8249 Family history of ischemic heart disease and other diseases of the circulatory system: Secondary | ICD-10-CM | POA: Diagnosis not present

## 2017-10-20 LAB — GLUCOSE, CAPILLARY: Glucose-Capillary: 116 mg/dL — ABNORMAL HIGH (ref 65–99)

## 2017-10-20 SURGERY — CANCELLED PROCEDURE

## 2017-10-20 MED ORDER — SODIUM CHLORIDE 0.9 % IV SOLN
INTRAVENOUS | Status: DC
  Administered 2017-10-20: 13:00:00 via INTRAVENOUS

## 2017-10-20 NOTE — Progress Notes (Signed)
Patient missed her morning dose of Eliquis. Per Dr. Acie Fredrickson she was cancelled for today.

## 2017-10-20 NOTE — Anesthesia Preprocedure Evaluation (Deleted)
Anesthesia Evaluation  Patient identified by MRN, date of birth, ID band Patient awake    Reviewed: Allergy & Precautions, NPO status , Patient's Chart, lab work & pertinent test results  History of Anesthesia Complications (+) PONV  Airway Mallampati: II  TM Distance: >3 FB Neck ROM: Full    Dental no notable dental hx.    Pulmonary neg pulmonary ROS,    Pulmonary exam normal breath sounds clear to auscultation       Cardiovascular negative cardio ROS Normal cardiovascular exam+ dysrhythmias Atrial Fibrillation  Rhythm:Regular Rate:Normal     Neuro/Psych negative neurological ROS     GI/Hepatic GERD  ,  Endo/Other  diabetes  Renal/GU      Musculoskeletal   Abdominal   Peds  Hematology   Anesthesia Other Findings   Reproductive/Obstetrics                             Anesthesia Physical Anesthesia Plan  ASA: III  Anesthesia Plan: General   Post-op Pain Management:    Induction: Intravenous  PONV Risk Score and Plan: Treatment may vary due to age or medical condition  Airway Management Planned: Mask, Natural Airway and Nasal Cannula  Additional Equipment:   Intra-op Plan:   Post-operative Plan:   Informed Consent: I have reviewed the patients History and Physical, chart, labs and discussed the procedure including the risks, benefits and alternatives for the proposed anesthesia with the patient or authorized representative who has indicated his/her understanding and acceptance.     Plan Discussed with:   Anesthesia Plan Comments:         Anesthesia Quick Evaluation

## 2017-10-20 NOTE — Interval H&P Note (Signed)
History and Physical Interval Note:  10/20/2017 1:53 PM  Julie Norton  has presented today for surgery, with the diagnosis of AFIB  The various methods of treatment have been discussed with the patient and family. After consideration of risks, benefits and other options for treatment, the patient has consented to  Procedure(s): CARDIOVERSION (N/A) as a surgical intervention .  The patient's history has been reviewed, patient examined, no change in status, stable for surgery.  I have reviewed the patient's chart and labs.  Questions were answered to the patient's satisfaction.     Mertie Moores

## 2017-10-22 ENCOUNTER — Other Ambulatory Visit: Payer: Self-pay | Admitting: Cardiology

## 2017-10-22 DIAGNOSIS — I4819 Other persistent atrial fibrillation: Secondary | ICD-10-CM

## 2017-10-28 ENCOUNTER — Ambulatory Visit (HOSPITAL_COMMUNITY)
Admission: RE | Admit: 2017-10-28 | Discharge: 2017-10-28 | Disposition: A | Discharge status: Home / Self Care | Payer: Medicare Other | Source: Ambulatory Visit | Attending: Cardiology | Admitting: Cardiology

## 2017-10-28 ENCOUNTER — Encounter (HOSPITAL_COMMUNITY)
Admission: RE | Disposition: A | Discharge status: Home / Self Care | Payer: Self-pay | Source: Ambulatory Visit | Attending: Cardiology

## 2017-10-28 ENCOUNTER — Ambulatory Visit (HOSPITAL_BASED_OUTPATIENT_CLINIC_OR_DEPARTMENT_OTHER): Payer: Medicare Other

## 2017-10-28 ENCOUNTER — Ambulatory Visit (HOSPITAL_COMMUNITY): Payer: Medicare Other | Admitting: Certified Registered"

## 2017-10-28 ENCOUNTER — Encounter (HOSPITAL_COMMUNITY): Payer: Self-pay | Admitting: *Deleted

## 2017-10-28 DIAGNOSIS — Z794 Long term (current) use of insulin: Secondary | ICD-10-CM | POA: Insufficient documentation

## 2017-10-28 DIAGNOSIS — M1990 Unspecified osteoarthritis, unspecified site: Secondary | ICD-10-CM | POA: Insufficient documentation

## 2017-10-28 DIAGNOSIS — E039 Hypothyroidism, unspecified: Secondary | ICD-10-CM | POA: Diagnosis not present

## 2017-10-28 DIAGNOSIS — E119 Type 2 diabetes mellitus without complications: Secondary | ICD-10-CM | POA: Diagnosis not present

## 2017-10-28 DIAGNOSIS — I340 Nonrheumatic mitral (valve) insufficiency: Secondary | ICD-10-CM

## 2017-10-28 DIAGNOSIS — E7800 Pure hypercholesterolemia, unspecified: Secondary | ICD-10-CM | POA: Diagnosis not present

## 2017-10-28 DIAGNOSIS — Z7901 Long term (current) use of anticoagulants: Secondary | ICD-10-CM | POA: Insufficient documentation

## 2017-10-28 DIAGNOSIS — Z923 Personal history of irradiation: Secondary | ICD-10-CM | POA: Diagnosis not present

## 2017-10-28 DIAGNOSIS — I481 Persistent atrial fibrillation: Secondary | ICD-10-CM

## 2017-10-28 DIAGNOSIS — I4819 Other persistent atrial fibrillation: Secondary | ICD-10-CM

## 2017-10-28 DIAGNOSIS — K219 Gastro-esophageal reflux disease without esophagitis: Secondary | ICD-10-CM | POA: Insufficient documentation

## 2017-10-28 DIAGNOSIS — I4510 Unspecified right bundle-branch block: Secondary | ICD-10-CM | POA: Diagnosis not present

## 2017-10-28 DIAGNOSIS — I4891 Unspecified atrial fibrillation: Secondary | ICD-10-CM | POA: Diagnosis not present

## 2017-10-28 HISTORY — PX: CARDIOVERSION: SHX1299

## 2017-10-28 HISTORY — PX: TEE WITHOUT CARDIOVERSION: SHX5443

## 2017-10-28 SURGERY — ECHOCARDIOGRAM, TRANSESOPHAGEAL
Anesthesia: Monitor Anesthesia Care

## 2017-10-28 MED ORDER — PROPOFOL 500 MG/50ML IV EMUL
INTRAVENOUS | Status: DC | PRN
  Administered 2017-10-28: 50 ug/kg/min via INTRAVENOUS

## 2017-10-28 MED ORDER — LIDOCAINE HCL (CARDIAC) PF 100 MG/5ML IV SOSY
PREFILLED_SYRINGE | INTRAVENOUS | Status: DC | PRN
  Administered 2017-10-28: 60 mg via INTRAVENOUS

## 2017-10-28 MED ORDER — BUTAMBEN-TETRACAINE-BENZOCAINE 2-2-14 % EX AERO
INHALATION_SPRAY | CUTANEOUS | Status: DC | PRN
  Administered 2017-10-28: 2 via TOPICAL

## 2017-10-28 MED ORDER — PROPOFOL 10 MG/ML IV BOLUS
INTRAVENOUS | Status: DC | PRN
  Administered 2017-10-28: 35 mg via INTRAVENOUS
  Administered 2017-10-28: 10 mg via INTRAVENOUS

## 2017-10-28 MED ORDER — SODIUM CHLORIDE 0.9 % IV SOLN: INTRAVENOUS | Status: DC

## 2017-10-28 MED ORDER — LACTATED RINGERS IV SOLN
INTRAVENOUS | Status: DC | PRN
  Administered 2017-10-28: 09:00:00 via INTRAVENOUS

## 2017-10-28 NOTE — H&P (Signed)
Office Visit   09/30/2017 [facility]    Martinique, Peter M, MD  Cardiology   New onset a-fib [facility]) +3 more  Dx   Follow-up , Atrial Fibrillation ; Referred by Eulas Post, MD  Reason for Visit   Additional Documentation   Vitals:   BP 110/62   Pulse 92   Ht 5\' 1"  (1.549 m)   Wt 151 lb (68.5 kg)   BMI 28.53 kg/m   BSA 1.72 m      More Vitals   Flowsheets:   MEWS Score,   Anthropometrics     Encounter Info:   Billing Info,   History,   Allergies,   Detailed Report     All Notes   Procedures by Martinique, Peter M, MD at 09/30/2017 2:40 PM  Author: Martinique, Peter M, MD Author Type: Physician Filed: 10/01/2017 4:31 PM  Note Status: Signed Cosign: Cosign Not Required Encounter Date: 09/30/2017  Editor: Thayer Headings  Procedure Orders:  1. EKG 12-Lead [[id number]] ordered by Martinique, Peter M, MD at 09/30/17 1527         Scan on 10/01/2017 4:31 PM by Thayer Headings: [facility]  Addendum Note by Luanna Salk, LPN at 7/32/2025 4:27 PM  Author: Luanna Salk, LPN Author Type: Licensed Practical Nurse Filed: 09/30/2017 3:40 PM  Note Status: Signed Cosign: Cosign Not Required Encounter Date: 09/30/2017  Editor: Luanna Salk, LPN (Licensed Practical Nurse)    Addended by: Kathyrn Lass on: 09/30/2017 03:40 PM   Modules accepted: Orders     Patient Instructions by Luanna Salk, LPN at 0/62/3762 8:31 PM  Author: Luanna Salk, LPN Author Type: Licensed Practical Nurse Filed: 09/30/2017 3:39 PM  Note Status: Signed Cosign: Cosign Not Required Encounter Date: 09/30/2017  Editor: Luanna Salk, LPN (Licensed Practical Nurse)     You are scheduled for a Cardioversion on Wednesday 10/20/17 with Dr.Nahser. Please arrive at the [facility] (Main Entrance A) at [facility]: [address] [place], [place] [address] at 12:30 pm.   DIET: Nothing to eat or drink after midnight except a sip  of water with medications (see medication instructions below)  Medication Instructions:  Continue your ELIQUIS You will need to continue your anticoagulant after your procedure until you            are told by your  Provider that it is safe to stop   Labs: TO BE DONE Wednesday 10/13/17 [facility] ( [facility] )            Lab opens 8:00 to 12:00 noon you may eat.   You must have a responsible person to drive you home and stay in the waiting area during your procedure. Failure to do so could result in cancellation.  Bring your insurance cards.  *Special Note: Every effort is made to have your procedure done on time. Occasionally there are emergencies that occur at the hospital that may cause delays. Please be patient if a delay does occur.      Progress Notes by Martinique, Peter M, MD at 09/30/2017 2:40 PM  Author: Martinique, Peter M, MD Author Type: Physician Filed: 09/30/2017 3:27 PM  Note Status: Signed Cosign: Cosign Not Required Encounter Date: 09/30/2017  Editor: Martinique, Peter M, MD (Physician)  Expand All Collapse All       Cardiology Office Note   Date:  09/30/2017   ID:  [name],  DOB December 22, 1929, MRN [id number]  PCP:  Eulas Post, MD           Cardiologist: Peter Martinique MD     Chief Complaint  Patient presents with  . Follow-up  . Atrial Fibrillation      History of Present Illness: YISELL SPRUNGER is a 84 y.o. female who presents for evaluation of Afib- new onset.   She has a past history of hypercholesterolemia. She also has a history of DM managed with oral agents but is now on insulin. Echo in 2015 showed mild MR with normal LV function. She had a normal Myoview study in November 2017.   She did undergo GI evaluation in September 2018  for iron deficiency anemia. She had a small hiatal hernia and diverticulosis. One small rectal polyp.   She was seen by Dr. Elease Hashimoto on 09/20/17. Found to be in Afib with rate 95.  Started on Eliquis. Follow up Echo performed and she is seen today for evaluation of AFib. Since then she does note some fatigue, decreased energy and leg swelling. Some dyspnea and chest tightness. Was started on lasix with improvement of these symptoms. Still doesn't feel normal. Cannot really tell when Afib started.       Past Medical History:  Diagnosis Date  . Abdominal adhesions   . Abdominal gas pain    suspected -- may be related to intermittent right upper quadrant discomfort radiating around her back      . Breast mass    left breast  . Cancer ([place])    left breast  . Chest pain    intermittent right upper quadrant discomfort radiating around her back      . Complication of anesthesia   . Diabetes mellitus without complication ([place])    Type II  . Dyspepsia   . GERD (gastroesophageal reflux disease)   . H/O: hysterectomy 1978  . Hypercholesteremia   . Hypothyroidism   . Personal history of radiation therapy   . Pneumonia   . PONV (postoperative nausea and vomiting)   . Tendinitis    of the right arm and shoulder  . Torn tendon    right shoulder         Past Surgical History:  Procedure Laterality Date  . ABDOMINAL HYSTERECTOMY    . APPENDECTOMY  1978  . BLADDER REPAIR    . BREAST LUMPECTOMY Left 02/24/2011   central partial mastectomy - dr Margot Chimes  . BREAST REDUCTION SURGERY    . COLONOSCOPY WITH PROPOFOL N/A 03/24/2017   Procedure: COLONOSCOPY WITH PROPOFOL;  Surgeon: Wilford Corner, MD;  Location: [facility];  Service: Endoscopy;  Laterality: N/A;  . ESOPHAGOGASTRODUODENOSCOPY (EGD) WITH PROPOFOL N/A 03/24/2017   Procedure: ESOPHAGOGASTRODUODENOSCOPY (EGD) WITH PROPOFOL;  Surgeon: Wilford Corner, MD;  Location: [place];  Service: Endoscopy;  Laterality: N/A;  . LIPOMA EXCISION Right   . REDUCTION MAMMAPLASTY Bilateral 1998  . RETINAL DETACHMENT SURGERY Left   . SALPINGOOPHORECTOMY    . TONSILLECTOMY AND  ADENOIDECTOMY  1950   at 84 years of age           Current Outpatient Medications  Medication Sig Dispense Refill  . apixaban (ELIQUIS) 5 MG TABS tablet Take 1 tablet (5 mg total) by mouth 2 (two) times daily. 60 tablet 2  . atorvastatin (LIPITOR) 10 MG tablet TAKE ONE-HALF TABLET BY  MOUTH EVERY OTHER DAY OR AS DIRECTED 45 tablet 3  . Calcium Carbonate-Vitamin D (CALCIUM 600+D) 600-200 MG-UNIT TABS Take 2 tablets  by mouth daily.     . Cholecalciferol (VITAMIN D PO) Take 1 tablet by mouth daily.     . DENTAGEL 1.1 % GEL dental gel Apply 1 application topically See admin instructions.  3  . furosemide (LASIX) 20 MG tablet Take 1 tablet (20 mg total) by mouth daily. 30 tablet 6  . glucosamine-chondroitin 500-400 MG tablet Take 2 tablets by mouth daily.     Marland Kitchen glucose blood (ONE TOUCH ULTRA TEST) test strip Check once daily. E11.40 100 each 11  . insulin degludec (TRESIBA FLEXTOUCH) 100 UNIT/ML SOPN FlexTouch Pen Inject 0.14 mLs (14 Units total) into the skin daily at 10 pm. (Patient taking differently: Inject 16 Units into the skin daily at 10 pm. ) 5 pen 3  . Insulin Pen Needle 29G X 5MM MISC Use once daily 100 each 3  . levothyroxine (SYNTHROID, LEVOTHROID) 50 MCG tablet TAKE 1 TABLET BY MOUTH  DAILY 90 tablet 0  . metFORMIN (GLUCOPHAGE-XR) 500 MG 24 hr tablet TAKE 1 TABLET BY MOUTH  TWICE A DAY 180 tablet 1  . omeprazole (PRILOSEC) 20 MG capsule TAKE 1 CAPSULE BY MOUTH TWO TIMES DAILY 180 capsule 1  . ONE TOUCH ULTRA TEST test strip CHECK ONCE A DAY 50 each 3  . triamcinolone cream (KENALOG) 0.1 % Apply 1 application topically 2 (two) times daily as needed. 30 g 2            Current Facility-Administered Medications  Medication Dose Route Frequency Provider Last Rate Last Dose  . aspirin chewable tablet 81 mg  81 mg Oral Once Martinique, Peter M, MD        Allergies:   Bactrim; Dilaudid [hydromorphone hcl]; Hydrocodone; Invokana [canagliflozin]; and Macrodantin    Social  History:  The patient  reports that she has never smoked. She has never used smokeless tobacco. She reports that she does not drink alcohol or use drugs.   Family History:  The patient's family history includes Aortic aneurysm in her mother and sister; Cancer in her mother; Diabetes in her mother; Heart attack in her mother; Hypertension in her mother; Pneumonia in her father; Stroke in her mother.    ROS:  Please see the history of present illness.   Otherwise, review of systems are positive for none.   All other systems are reviewed and negative.    PHYSICAL EXAM: VS:  BP 110/62   Pulse 92   Ht 5\' 1"  (1.549 m)   Wt 151 lb (68.5 kg)   BMI 28.53 kg/m  , BMI Body mass index is 28.53 kg/m. GENERAL:  Well appearing WF in NAD HEENT:  PERRL, EOMI, sclera are clear. Oropharynx is clear. NECK:  No jugular venous distention, carotid upstroke brisk and symmetric, no bruits, no thyromegaly or adenopathy LUNGS:  Clear to auscultation bilaterally CHEST:  Unremarkable HEART:  IRRR,  PMI not displaced or sustained,S1 and S2 within normal limits, no S3, no S4: no clicks, no rubs, no murmurs ABD:  Soft, nontender. BS +, no masses or bruits. No hepatomegaly, no splenomegaly EXT:  2 + pulses throughout, 1+ edema, no cyanosis no clubbing SKIN:  Warm and dry.  No rashes NEURO:  Alert and oriented x 3. Cranial nerves II through XII intact. PSYCH:  Cognitively intact     EKG:  EKG is  ordered today. Afib with rate 92. RBBB.  I have personally reviewed and interpreted this study.    Recent Labs: 04/26/2017: ALT 12 09/20/2017: BUN 14; Creatinine, Ser 0.67;  Hemoglobin 11.5; Platelets 165.0; Potassium 4.1; Sodium 139; TSH 1.61    Lipid Panel Labs (Brief)          Component Value Date/Time   CHOL 135 04/26/2017 0840   TRIG 100 04/26/2017 0840   HDL 45 04/26/2017 0840   CHOLHDL 3.0 04/26/2017 0840   CHOLHDL 5.0 (H) 07/30/2016 0808   VLDL 43 (H) 07/30/2016 0808   LDLCALC 70  04/26/2017 0840           Wt Readings from Last 3 Encounters:  09/30/17 151 lb (68.5 kg)  09/20/17 152 lb (68.9 kg)  06/22/17 149 lb (67.6 kg)      Lexiscan myoview 05/26/16:Study Highlights    Nuclear stress EF: 72%.  There was no ST segment deviation noted during stress.  This is a low risk study.  The left ventricular ejection fraction is hyperdynamic (>65%).  1. Small, mild mid-anteroseptal perfusion defect. This defect was actually more intense at rest than with stress. No ischemia. Suspect attenuation.  2. EF 72% with normal wall motion.  3. Overall low risk study.     Echo 09/24/17: Study Conclusions  - Left ventricle: The cavity size was normal. Systolic function was normal. The estimated ejection fraction was in the range of 55% to 60%. Wall motion was normal; there were no regional wall motion abnormalities. - Mitral valve: There was mild regurgitation. - Left atrium: The atrium was mildly dilated. - Right atrium: The atrium was mildly dilated. - Atrial septum: No defect or patent foramen ovale was identified. - Pulmonary arteries: Systolic pressure was mildly increased. PA peak pressure: 37 mm Hg (S).    ASSESSMENT AND PLAN:  1. Atrial fibrillation. New onset. Persistent. [place] vasc score of 4. Now on Eliquis at appropriate dose of 5 mg bid. Will need anticoagulation chronically. Rate is controlled on no AV nodal blocking drugs. She is symptomatic but symptoms improved on lasix. Echo looks good. Recommend DCCV in 2.5 weeks (total of 4 weeks on anticoagulation). Procedure and risks explained in detail.   2.  Hypercholesterolemia-  She is at goal. Continue low dose lipitor.   3. History of breast cancer of left breast treated with lumpectomy and radiation therapy  4. Diabetes mellitus- now on insulin.  Encouraged sodium restriction.    Follow up in 4 weeks  Signed, Peter Martinique MD, [facility]   09/30/2017 3:22 PM    [facility]     For TEE/DCCV; compliant with apixaban; no changes [name]

## 2017-10-28 NOTE — Progress Notes (Signed)
  Echocardiogram 2D Echocardiogram has been performed.  Jennette Dubin 10/28/2017, 9:24 AM

## 2017-10-28 NOTE — Discharge Instructions (Signed)
TEE ° °YOU HAD AN CARDIAC PROCEDURE TODAY: Refer to the procedure report and other information in the discharge instructions given to you for any specific questions about what was found during the examination. If this information does not answer your questions, please call [facility] office at [phone] to clarify.  ° °DIET: Your first meal following the procedure should be a light meal and then it is ok to progress to your normal diet. A half-sandwich or bowl of soup is an example of a good first meal. Heavy or fried foods are harder to digest and may make you feel nauseous or bloated. Drink plenty of fluids but you should avoid alcoholic beverages for 24 hours. If you had a esophageal dilation, please see attached instructions for diet.  ° °ACTIVITY: Your care partner should take you home directly after the procedure. You should plan to take it easy, moving slowly for the rest of the day. You can resume normal activity the day after the procedure however YOU SHOULD NOT DRIVE, use power tools, machinery or perform tasks that involve climbing or major physical exertion for 24 hours (because of the sedation medicines used during the test).  ° °SYMPTOMS TO REPORT IMMEDIATELY: °A cardiologist can be reached at any hour. Please call [phone] for any of the following symptoms:  °Vomiting of blood or coffee ground material  °New, significant abdominal pain  °New, significant chest pain or pain under the shoulder blades  °Painful or persistently difficult swallowing  °New shortness of breath  °Black, tarry-looking or red, bloody stools ° °FOLLOW UP:  °Please also call with any specific questions about appointments or follow up tests. ° °Electrical Cardioversion, Care After °This sheet gives you information about how to care for yourself after your procedure. Your health care provider may also give you more specific instructions. If you have problems or questions, contact your health care provider. °What can I  expect after the procedure? °After the procedure, it is common to have: °· Some redness on the skin where the shocks were given. ° °Follow these instructions at home: °· Do not drive for 24 hours if you were given a medicine to help you relax (sedative). °· Take over-the-counter and prescription medicines only as told by your health care provider. °· Ask your health care provider how to check your pulse. Check it often. °· Rest for 48 hours after the procedure or as told by your health care provider. °· Avoid or limit your caffeine use as told by your health care provider. °Contact a health care provider if: °· You feel like your heart is beating too quickly or your pulse is not regular. °· You have a serious muscle cramp that does not go away. °Get help right away if: °· You have discomfort in your chest. °· You are dizzy or you feel faint. °· You have trouble breathing or you are short of breath. °· Your speech is slurred. °· You have trouble moving an arm or leg on one side of your body. °· Your fingers or toes turn cold or blue. °This information is not intended to replace advice given to you by your health care provider. Make sure you discuss any questions you have with your health care provider. °Document Released: 04/12/2013 Document Revised: 01/24/2016 Document Reviewed: 12/27/2015 °Elsevier Interactive Patient Education © 2018 [employer] ° °

## 2017-10-28 NOTE — H&P (View-Only) (Signed)
    Transesophageal Echocardiogram Note  Julie Norton [id number] May 14, 1930  Procedure: Transesophageal Echocardiogram Indications: Atrial fibrillation   Procedure Details Consent: Obtained Time Out: Verified patient identification, verified procedure, site/side was marked, verified correct patient position, special equipment/implants available, Radiology Safety Procedures followed,  medications/allergies/relevent history reviewed, required imaging and test results available.  Performed  Medications:  Pt sedated by anesthesia with diprovan 80 mg IV total.  Low normal LV function; no LAA thrombus; pt subsequently underwent DCCV with 120J to sinus rhythm succesfully; no immediate complications; continue apixaban and fu with Dr Martinique.   Complications: No apparent complications Patient did tolerate procedure well.  [name], MD

## 2017-10-28 NOTE — Anesthesia Preprocedure Evaluation (Addendum)
Anesthesia Evaluation  Patient identified by MRN, date of birth, ID band Patient awake    Reviewed: Allergy & Precautions, NPO status , Patient's Chart, lab work & pertinent test results  History of Anesthesia Complications (+) PONV and history of anesthetic complications  Airway Mallampati: II  TM Distance: >3 FB Neck ROM: Full    Dental  (+) Dental Advisory Given, Partial Lower, Upper Dentures   Pulmonary neg pulmonary ROS,    Pulmonary exam normal breath sounds clear to auscultation       Cardiovascular negative cardio ROS  + dysrhythmias Atrial Fibrillation + Valvular Problems/Murmurs MR  Rhythm:Irregular Rate:Normal     Neuro/Psych negative neurological ROS  negative psych ROS   GI/Hepatic Neg liver ROS, GERD  Medicated,  Endo/Other  diabetes, Type 2, Oral Hypoglycemic Agents, Insulin DependentHypothyroidism   Renal/GU negative Renal ROS     Musculoskeletal  (+) Arthritis ,   Abdominal   Peds  Hematology  (+) Blood dyscrasia (Eliquis), anemia ,   Anesthesia Other Findings Day of surgery medications reviewed with the patient.  Reproductive/Obstetrics                            Anesthesia Physical Anesthesia Plan  ASA: III  Anesthesia Plan: MAC   Post-op Pain Management:    Induction: Intravenous  PONV Risk Score and Plan: 3 and Propofol infusion and Treatment may vary due to age or medical condition  Airway Management Planned: Nasal Cannula and Simple Face Mask  Additional Equipment:   Intra-op Plan:   Post-operative Plan:   Informed Consent: I have reviewed the patients History and Physical, chart, labs and discussed the procedure including the risks, benefits and alternatives for the proposed anesthesia with the patient or authorized representative who has indicated his/her understanding and acceptance.   Dental advisory given  Plan Discussed with: CRNA and  Anesthesiologist  Anesthesia Plan Comments: (Discussed risks/benefits/alternatives to MAC sedation including need for ventilatory support, hypotension, need for conversion to general anesthesia.  All patient questions answered.  Patient/guardian wishes to proceed.)        Anesthesia Quick Evaluation

## 2017-10-28 NOTE — Transfer of Care (Signed)
Immediate Anesthesia Transfer of Care Note  Patient: Julie Norton  Procedure(s) Performed: TRANSESOPHAGEAL ECHOCARDIOGRAM (TEE) (N/A ) CARDIOVERSION (N/A )  Patient Location: PACU  Anesthesia Type:MAC  Level of Consciousness: drowsy  Airway & Oxygen Therapy: Patient Spontanous Breathing and Patient connected to nasal cannula oxygen  Post-op Assessment: Report given to RN and Post -op Vital signs reviewed and stable  Post vital signs: Reviewed and stable  Last Vitals:  Vitals Value Taken Time  BP 91/47 10/28/2017  9:25 AM  Temp    Pulse 74 10/28/2017  9:26 AM  Resp 34 10/28/2017  9:26 AM  SpO2 100 % 10/28/2017  9:26 AM  Vitals shown include unvalidated device data.  Last Pain:  Vitals:   10/28/17 0925  TempSrc: Oral  PainSc:          Complications: No apparent anesthesia complications

## 2017-10-28 NOTE — Progress Notes (Signed)
    Transesophageal Echocardiogram Note  Julie Norton [id number] 1929/10/02  Procedure: Transesophageal Echocardiogram Indications: Atrial fibrillation   Procedure Details Consent: Obtained Time Out: Verified patient identification, verified procedure, site/side was marked, verified correct patient position, special equipment/implants available, Radiology Safety Procedures followed,  medications/allergies/relevent history reviewed, required imaging and test results available.  Performed  Medications:  Pt sedated by anesthesia with diprovan 80 mg IV total.  Low normal LV function; no LAA thrombus; pt subsequently underwent DCCV with 120J to sinus rhythm succesfully; no immediate complications; continue apixaban and fu with Dr Martinique.   Complications: No apparent complications Patient did tolerate procedure well.  [name], MD

## 2017-10-28 NOTE — Interval H&P Note (Signed)
History and Physical Interval Note:  10/28/2017 9:19 AM  Julie Norton  has presented today for surgery, with the diagnosis of AFIB  The various methods of treatment have been discussed with the patient and family. After consideration of risks, benefits and other options for treatment, the patient has consented to  Procedure(s): TRANSESOPHAGEAL ECHOCARDIOGRAM (TEE) (N/A) CARDIOVERSION (N/A) as a surgical intervention .  The patient's history has been reviewed, patient examined, no change in status, stable for surgery.  I have reviewed the patient's chart and labs.  Questions were answered to the patient's satisfaction.     [name]

## 2017-10-29 ENCOUNTER — Encounter (HOSPITAL_COMMUNITY): Payer: Self-pay | Admitting: Cardiology

## 2017-10-29 NOTE — Anesthesia Postprocedure Evaluation (Signed)
Anesthesia Post Note  Patient: Julie Norton  Procedure(s) Performed: TRANSESOPHAGEAL ECHOCARDIOGRAM (TEE) (N/A ) CARDIOVERSION (N/A )     Patient location during evaluation: Endoscopy Anesthesia Type: MAC Level of consciousness: awake and alert Pain management: pain level controlled Vital Signs Assessment: post-procedure vital signs reviewed and stable Respiratory status: spontaneous breathing, nonlabored ventilation, respiratory function stable and patient connected to nasal cannula oxygen Cardiovascular status: stable and blood pressure returned to baseline Postop Assessment: no apparent nausea or vomiting Anesthetic complications: no    Last Vitals:  Vitals:   10/28/17 0940 10/28/17 0950  BP: 106/70 115/63  Pulse: 71 72  Resp: 14 16  Temp:    SpO2: 100% 100%    Last Pain:  Vitals:   10/28/17 0950  TempSrc:   PainSc: 0-No pain                 Catalina Gravel

## 2017-11-01 ENCOUNTER — Ambulatory Visit: Payer: Medicare Other | Admitting: Cardiology

## 2017-11-09 NOTE — Progress Notes (Signed)
Cardiology Office Note   Date:  11/11/2017   ID:  Julie Norton, DOB 1930/06/03, MRN [id number]  PCP:  Eulas Post, MD  Cardiologist: Arriyanna Mersch Martin[place] MD  Chief Complaint  Patient presents with  . Atrial Fibrillation      History of Present Illness: Julie Norton is a 84 y.o. female who presents for follow up of Atrial fibrillation.   She has a past history of hypercholesterolemia. She also has a history of DM managed with oral agents but is now on insulin. Echo in 2015 showed mild MR with normal LV function. She had a normal Myoview study in November 2017.   She did undergo GI evaluation in September 2018  for iron deficiency anemia. She had a small hiatal hernia and diverticulosis. One small rectal polyp.   She was seen by Dr. Elease Hashimoto on 09/20/17. Found to be in Afib with rate 95. Started on Eliquis. Follow up Echo showed mild biatrial enlargement and normal LV function. She  noted some fatigue, decreased energy and leg swelling. Some dyspnea and chest tightness. Was started on lasix with improvement of these symptoms. She subsequently underwent TEE guided DCCV on 10/28/17 with return to NSR.   On follow up today she is feeling much better. Her energy and dyspnea are improved. Still has some swelling in legs. Notes some difficulty sleeping at night and has some discomfort in her feet that keeps her up at night.   Past Medical History:  Diagnosis Date  . Abdominal adhesions   . Abdominal gas pain    suspected -- may be related to intermittent right upper quadrant discomfort radiating around her back      . Breast mass    left breast  . Cancer ([place])    left breast  . Chest pain    intermittent right upper quadrant discomfort radiating around her back      . Complication of anesthesia   . Diabetes mellitus without complication ([place])    Type II  . Dyspepsia   . GERD (gastroesophageal reflux disease)   . H/O: hysterectomy 1978  . Hypercholesteremia   .  Hypothyroidism   . Personal history of radiation therapy   . Pneumonia   . PONV (postoperative nausea and vomiting)   . Tendinitis    of the right arm and shoulder  . Torn tendon    right shoulder    Past Surgical History:  Procedure Laterality Date  . ABDOMINAL HYSTERECTOMY    . APPENDECTOMY  1978  . BLADDER REPAIR    . BREAST LUMPECTOMY Left 02/24/2011   central partial mastectomy - dr Margot Chimes  . BREAST REDUCTION SURGERY    . CARDIOVERSION N/A 10/28/2017   Procedure: CARDIOVERSION;  Surgeon: Lelon Perla, MD;  Location: [facility] ENDOSCOPY;  Service: Cardiovascular;  Laterality: N/A;  . COLONOSCOPY WITH PROPOFOL N/A 03/24/2017   Procedure: COLONOSCOPY WITH PROPOFOL;  Surgeon: Wilford Corner, MD;  Location: [place];  Service: Endoscopy;  Laterality: N/A;  . ESOPHAGOGASTRODUODENOSCOPY (EGD) WITH PROPOFOL N/A 03/24/2017   Procedure: ESOPHAGOGASTRODUODENOSCOPY (EGD) WITH PROPOFOL;  Surgeon: Wilford Corner, MD;  Location: [place];  Service: Endoscopy;  Laterality: N/A;  . LIPOMA EXCISION Right   . REDUCTION MAMMAPLASTY Bilateral 1998  . RETINAL DETACHMENT SURGERY Left   . SALPINGOOPHORECTOMY    . TEE WITHOUT CARDIOVERSION N/A 10/28/2017   Procedure: TRANSESOPHAGEAL ECHOCARDIOGRAM (TEE);  Surgeon: Lelon Perla, MD;  Location: [facility] ENDOSCOPY;  Service: Cardiovascular;  Laterality: N/A;  . TONSILLECTOMY AND  ADENOIDECTOMY  1950   at 84 years of age     Current Outpatient Medications  Medication Sig Dispense Refill  . apixaban (ELIQUIS) 5 MG TABS tablet Take 1 tablet (5 mg total) by mouth 2 (two) times daily. 60 tablet 2  . atorvastatin (LIPITOR) 10 MG tablet TAKE ONE-HALF TABLET BY  MOUTH EVERY OTHER DAY OR AS DIRECTED 45 tablet 3  . Calcium Carbonate-Vitamin D (CALCIUM 600+D) 600-200 MG-UNIT TABS Take 1 tablet by mouth 3 (three) times a week.     . Cholecalciferol (VITAMIN D PO) Take 1 tablet by mouth 3 (three) times a week.     . furosemide (LASIX) 20 MG tablet Take 1  tablet (20 mg total) by mouth daily. 30 tablet 6  . glucosamine-chondroitin 500-400 MG tablet Take 1 tablet by mouth 3 (three) times a week.     Marland Kitchen glucose blood (ONE TOUCH ULTRA TEST) test strip Check once daily. E11.40 100 each 11  . insulin degludec (TRESIBA FLEXTOUCH) 100 UNIT/ML SOPN FlexTouch Pen Inject 0.14 mLs (14 Units total) into the skin daily at 10 pm. (Patient taking differently: Inject 18 Units into the skin daily at 10 pm. ) 5 pen 3  . Insulin Pen Needle 29G X 5MM MISC Use once daily 100 each 3  . levothyroxine (SYNTHROID, LEVOTHROID) 50 MCG tablet TAKE 1 TABLET BY MOUTH  DAILY (Patient taking differently: TAKE 50 MCG BY MOUTH DAILY) 90 tablet 0  . metFORMIN (GLUCOPHAGE-XR) 500 MG 24 hr tablet TAKE 1 TABLET BY MOUTH  TWICE A DAY (Patient taking differently: TAKE 500 MG BY MOUTH TWICE A DAY) 180 tablet 1  . omeprazole (PRILOSEC) 20 MG capsule TAKE 1 CAPSULE BY MOUTH TWO TIMES DAILY (Patient taking differently: TAKE 20 MG BY MOUTH TWO TIMES DAILY) 180 capsule 1  . ONE TOUCH ULTRA TEST test strip CHECK ONCE A DAY 50 each 3  . triamcinolone cream (KENALOG) 0.1 % Apply 1 application topically 2 (two) times daily as needed. (Patient taking differently: Apply 1 application topically 2 (two) times daily as needed (for itching). ) 30 g 2   Current Facility-Administered Medications  Medication Dose Route Frequency Provider Last Rate Last Dose  . aspirin chewable tablet 81 mg  81 mg Oral Once Martin[place], Heiley Shaikh M, MD        Allergies:   Bactrim; Dilaudid [hydromorphone hcl]; Hydrocodone; Invokana [canagliflozin]; and Macrodantin    Social History:  The patient  reports that she has never smoked. She has never used smokeless tobacco. She reports that she does not drink alcohol or use drugs.   Family History:  The patient's family history includes Aortic aneurysm in her mother and sister; Cancer in her mother; Diabetes in her mother; Heart attack in her mother; Hypertension in her mother; Pneumonia  in her father; Stroke in her mother.    ROS:  Please see the history of present illness.   Otherwise, review of systems are positive for none.   All other systems are reviewed and negative.    PHYSICAL EXAM: VS:  BP 120/64   Pulse 71   Ht 5' 1.5" (1.562 m)   Wt 148 lb 3.2 oz (67.2 kg)   BMI 27.55 kg/m  , BMI Body mass index is 27.55 kg/m. GENERAL:  Well appearing WF in NAD HEENT:  PERRL, EOMI, sclera are clear. Oropharynx is clear. NECK:  No jugular venous distention, carotid upstroke brisk and symmetric, no bruits, no thyromegaly or adenopathy LUNGS:  Clear to auscultation bilaterally CHEST:  Unremarkable HEART:  RRR,  PMI not displaced or sustained,S1 and S2 within normal limits, no S3, no S4: no clicks, no rubs, no murmurs ABD:  Soft, nontender. BS +, no masses or bruits. No hepatomegaly, no splenomegaly EXT:  2 + pulses throughout, tr edema, no cyanosis no clubbing SKIN:  Warm and dry.  No rashes NEURO:  Alert and oriented x 3. Cranial nerves II through XII intact. PSYCH:  Cognitively intact     EKG:  EKG is  ordered today. NSR rate 71. RBBB.  I have personally reviewed and interpreted this study.    Recent Labs: 04/26/2017: ALT 12 09/20/2017: TSH 1.61 10/12/2017: BUN 12; Creatinine, Ser 0.78; Hemoglobin 11.2; Platelets 189; Potassium 4.4; Sodium 141    Lipid Panel    Component Value Date/Time   CHOL 135 04/26/2017 0840   TRIG 100 04/26/2017 0840   HDL 45 04/26/2017 0840   CHOLHDL 3.0 04/26/2017 0840   CHOLHDL 5.0 (H) 07/30/2016 0808   VLDL 43 (H) 07/30/2016 0808   LDLCALC 70 04/26/2017 0840      Wt Readings from Last 3 Encounters:  11/11/17 148 lb 3.2 oz (67.2 kg)  10/20/17 151 lb (68.5 kg)  09/30/17 151 lb (68.5 kg)      Lexiscan myoview 05/26/16:Study Highlights    Nuclear stress EF: 72%.  There was no ST segment deviation noted during stress.  This is a low risk study.  The left ventricular ejection fraction is hyperdynamic (>65%).   1.  Small, mild mid-anteroseptal perfusion defect.  This defect was actually more intense at rest than with stress.  No ischemia.  Suspect attenuation.  2. EF 72% with normal wall motion.  3. Overall low risk study.     Echo 09/24/17: Study Conclusions  - Left ventricle: The cavity size was normal. Systolic function was   normal. The estimated ejection fraction was in the range of 55%   to 60%. Wall motion was normal; there were no regional wall   motion abnormalities. - Mitral valve: There was mild regurgitation. - Left atrium: The atrium was mildly dilated. - Right atrium: The atrium was mildly dilated. - Atrial septum: No defect or patent foramen ovale was identified. - Pulmonary arteries: Systolic pressure was mildly increased. PA   peak pressure: 37 mm Hg (S).    ASSESSMENT AND PLAN:  1. Atrial fibrillation. New onset. Persistent. [place] vasc score of 4. Now on Eliquis at appropriate dose of 5 mg bid.   Now s/p TEE guided DCCV. Maintaining NSR so far and symptoms improved. Continue Eliquis indefinitely. I will follow up in 4 months. If Afib recurs may need to consider anti-arrhythmic drug therapy.  2.  Hypercholesterolemia-  She is at goal. Continue low dose lipitor.   3. History of breast cancer of left breast treated with lumpectomy and radiation therapy  4. Diabetes mellitus- now on insulin.  Encouraged sodium restriction.   5. Foot discomfort. Normal pulse on exam. Suspect she may have some neuropathy related to DM.    Follow up in 4 months  Signed, Drina Jobst Martin[place] MD, [facility]   11/11/2017 1:45 PM    [facility]

## 2017-11-11 ENCOUNTER — Encounter: Payer: Self-pay | Admitting: Cardiology

## 2017-11-11 ENCOUNTER — Ambulatory Visit: Payer: Medicare Other | Admitting: Cardiology

## 2017-11-11 VITALS — BP 120/64 | HR 71 | Ht 61.5 in | Wt 148.2 lb

## 2017-11-11 DIAGNOSIS — E7800 Pure hypercholesterolemia, unspecified: Secondary | ICD-10-CM

## 2017-11-11 DIAGNOSIS — I4510 Unspecified right bundle-branch block: Secondary | ICD-10-CM | POA: Diagnosis not present

## 2017-11-11 DIAGNOSIS — I481 Persistent atrial fibrillation: Secondary | ICD-10-CM | POA: Diagnosis not present

## 2017-11-11 DIAGNOSIS — I4819 Other persistent atrial fibrillation: Secondary | ICD-10-CM

## 2017-11-11 NOTE — Patient Instructions (Signed)
Continue your current therapy  I will see you in 4 months  

## 2017-11-16 DIAGNOSIS — D509 Iron deficiency anemia, unspecified: Secondary | ICD-10-CM | POA: Diagnosis not present

## 2017-11-30 ENCOUNTER — Ambulatory Visit (INDEPENDENT_AMBULATORY_CARE_PROVIDER_SITE_OTHER): Payer: Medicare Other | Admitting: Family Medicine

## 2017-11-30 ENCOUNTER — Encounter: Payer: Self-pay | Admitting: Family Medicine

## 2017-11-30 VITALS — BP 110/70 | HR 76 | Temp 98.3°F | Wt 149.3 lb

## 2017-11-30 DIAGNOSIS — E11628 Type 2 diabetes mellitus with other skin complications: Secondary | ICD-10-CM | POA: Diagnosis not present

## 2017-11-30 DIAGNOSIS — I4819 Other persistent atrial fibrillation: Secondary | ICD-10-CM

## 2017-11-30 DIAGNOSIS — L84 Corns and callosities: Secondary | ICD-10-CM | POA: Diagnosis not present

## 2017-11-30 DIAGNOSIS — I481 Persistent atrial fibrillation: Secondary | ICD-10-CM | POA: Diagnosis not present

## 2017-11-30 DIAGNOSIS — E1165 Type 2 diabetes mellitus with hyperglycemia: Secondary | ICD-10-CM | POA: Diagnosis not present

## 2017-11-30 DIAGNOSIS — K219 Gastro-esophageal reflux disease without esophagitis: Secondary | ICD-10-CM | POA: Diagnosis not present

## 2017-11-30 NOTE — Patient Instructions (Addendum)
Reduce the Omeprazole to every other day for 2 weeks and take Pepcid 20 mg twice daily on the non-Omeprazole days and then after 2 weeks can go to the Pepcid 20 mg twice daily.

## 2017-11-30 NOTE — Progress Notes (Signed)
Subjective:     Patient ID: Julie Norton, female   DOB: May 16, 1930, 84 y.o.   MRN: [id number]  HPI Patient here to discuss several items as follows  Patient was concerned about a sore area on her left great toe which she first noted Friday. She denies any change of shoe wear. No injury. Location is dorsum of the left great toe about midway down. No ulceration.. She generally has dry skin and uses Vaseline and different moisturizers regularly  Type 2 diabetes. History of poor control but slowly improving. Last A1c 2 months ago 7.8%. Recent fastings have been somewhat elevated and she had steroid injection in her left shoulder poor orthopedics back in April. She is on combination therapy with metformin and long-acting insulin  History of reflux. She's been on omeprazole for years. She had question tapering off. We had suggested consider taper to H2 blocker such as Zantac or Pepcid. She will like to try that at this time  Recent diagnosis of atrial fibrillation. She had recent cardioversion procedure and has done well since then  Past Medical History:  Diagnosis Date  . Abdominal adhesions   . Abdominal gas pain    suspected -- may be related to intermittent right upper quadrant discomfort radiating around her back      . Breast mass    left breast  . Cancer ([place])    left breast  . Chest pain    intermittent right upper quadrant discomfort radiating around her back      . Complication of anesthesia   . Diabetes mellitus without complication ([place])    Type II  . Dyspepsia   . GERD (gastroesophageal reflux disease)   . H/O: hysterectomy 1978  . Hypercholesteremia   . Hypothyroidism   . Personal history of radiation therapy   . Pneumonia   . PONV (postoperative nausea and vomiting)   . Tendinitis    of the right arm and shoulder  . Torn tendon    right shoulder   Past Surgical History:  Procedure Laterality Date  . ABDOMINAL HYSTERECTOMY    . APPENDECTOMY  1978  . BLADDER  REPAIR    . BREAST LUMPECTOMY Left 02/24/2011   central partial mastectomy - dr Margot Chimes  . BREAST REDUCTION SURGERY    . CARDIOVERSION N/A 10/28/2017   Procedure: CARDIOVERSION;  Surgeon: Lelon Perla, MD;  Location: [facility] ENDOSCOPY;  Service: Cardiovascular;  Laterality: N/A;  . COLONOSCOPY WITH PROPOFOL N/A 03/24/2017   Procedure: COLONOSCOPY WITH PROPOFOL;  Surgeon: Wilford Corner, MD;  Location: [place];  Service: Endoscopy;  Laterality: N/A;  . ESOPHAGOGASTRODUODENOSCOPY (EGD) WITH PROPOFOL N/A 03/24/2017   Procedure: ESOPHAGOGASTRODUODENOSCOPY (EGD) WITH PROPOFOL;  Surgeon: Wilford Corner, MD;  Location: [place];  Service: Endoscopy;  Laterality: N/A;  . LIPOMA EXCISION Right   . REDUCTION MAMMAPLASTY Bilateral 1998  . RETINAL DETACHMENT SURGERY Left   . SALPINGOOPHORECTOMY    . TEE WITHOUT CARDIOVERSION N/A 10/28/2017   Procedure: TRANSESOPHAGEAL ECHOCARDIOGRAM (TEE);  Surgeon: Lelon Perla, MD;  Location: [facility] ENDOSCOPY;  Service: Cardiovascular;  Laterality: N/A;  . [place]   at 84 years of age    reports that she has never smoked. She has never used smokeless tobacco. She reports that she does not drink alcohol or use drugs. family history includes Aortic aneurysm in her mother and sister; Cancer in her mother; Diabetes in her mother; Heart attack in her mother; Hypertension in her mother; Pneumonia in her father; Stroke  in her mother. Allergies  Allergen Reactions  . Bactrim Nausea And Vomiting  . Dilaudid [Hydromorphone Hcl] Other (See Comments)    Unknown  . Hydrocodone Nausea And Vomiting  . Invokana [Canagliflozin] Itching and Other (See Comments)    Yeast Infections   . Macrodantin Nausea And Vomiting     Review of Systems  Constitutional: Negative for fatigue and fever.  Eyes: Negative for visual disturbance.  Respiratory: Negative for cough, chest tightness, shortness of breath and wheezing.   Cardiovascular:  Negative for chest pain, palpitations and leg swelling.  Gastrointestinal: Negative for abdominal pain.  Endocrine: Negative for polydipsia and polyuria.  Neurological: Negative for dizziness, seizures, syncope, weakness, light-headedness and headaches.       Objective:   Physical Exam  Constitutional: She appears well-developed and well-nourished.  Eyes: Pupils are equal, round, and reactive to light.  Neck: Neck supple. No JVD present. No thyromegaly present.  Cardiovascular: Normal rate and regular rhythm. Exam reveals no gallop.  Pulmonary/Chest: Effort normal and breath sounds normal. No respiratory distress. She has no wheezes. She has no rales.  Musculoskeletal: She exhibits no edema.  Neurological: She is alert.  Skin:  Feet are examined. She has generally dry scan with some diffuse scaling on both feet. Left great toe reveals no ulceration. Good capillary refill. She has slightly thickened/callused area of skin dorsum of the left toe about midway down with no breaks in the skin noted. No cellulitis changes. Nontender.       Assessment:     #1 type 2 diabetes. Stable by recent A1c 7.8%. Possible mild recent exacerbation from steroid injections  #2 history of GERD. Currently stable on omeprazole  #3 small callused area left great toe. No ulceration  #4 atrial fibrillation. Currently appears to be in sinus rhythm following cardioversion     Plan:     -We discussed good foot care. We recommend she continue with good moisturizing lotion for both feet and avoid any tight fitting shoes or any shoes that would create any friction with the skin of her left great toe. Follow-up immediately for any also ration or changes -Continue close follow-up with cardiology regarding her atrial fibrillation -Consider reducing omeprazole to every other day for couple weeks and taking Pepcid 20 mg twice daily and off days and after couple weeks convert to Pepcid 20 mg twice daily -Patient has  follow-up in June and will keep that appointment  Eulas Post MD [facility]

## 2017-12-11 ENCOUNTER — Other Ambulatory Visit: Payer: Self-pay | Admitting: Family Medicine

## 2017-12-18 ENCOUNTER — Other Ambulatory Visit: Payer: Self-pay | Admitting: Family Medicine

## 2017-12-21 ENCOUNTER — Ambulatory Visit (INDEPENDENT_AMBULATORY_CARE_PROVIDER_SITE_OTHER): Payer: Medicare Other | Admitting: Family Medicine

## 2017-12-21 ENCOUNTER — Encounter: Payer: Self-pay | Admitting: Family Medicine

## 2017-12-21 VITALS — BP 120/70 | HR 75 | Temp 98.1°F | Wt 146.6 lb

## 2017-12-21 DIAGNOSIS — F5104 Psychophysiologic insomnia: Secondary | ICD-10-CM | POA: Diagnosis not present

## 2017-12-21 DIAGNOSIS — E11628 Type 2 diabetes mellitus with other skin complications: Secondary | ICD-10-CM | POA: Diagnosis not present

## 2017-12-21 DIAGNOSIS — L84 Corns and callosities: Secondary | ICD-10-CM | POA: Diagnosis not present

## 2017-12-21 LAB — POCT GLYCOSYLATED HEMOGLOBIN (HGB A1C): Hemoglobin A1C: 9.3 % — AB (ref 4.0–5.6)

## 2017-12-21 MED ORDER — TRAZODONE HCL 50 MG PO TABS: 25.0000 mg | ORAL_TABLET | Freq: Every evening | ORAL | 3 refills | Status: DC | PRN

## 2017-12-21 NOTE — Patient Instructions (Addendum)
Keep checking fasting blood sugars Titrate Tresiba up 2 units every 3 days until fasting sugars consistently < 130    Insomnia Insomnia is a sleep disorder that makes it difficult to fall asleep or to stay asleep. Insomnia can cause tiredness (fatigue), low energy, difficulty concentrating, mood swings, and poor performance at work or school. There are three different ways to classify insomnia:  Difficulty falling asleep.  Difficulty staying asleep.  Waking up too early in the morning.  Any type of insomnia can be long-term (chronic) or short-term (acute). Both are common. Short-term insomnia usually lasts for three months or less. Chronic insomnia occurs at least three times a week for longer than three months. What are the causes? Insomnia may be caused by another condition, situation, or substance, such as:  Anxiety.  Certain medicines.  Gastroesophageal reflux disease (GERD) or other gastrointestinal conditions.  Asthma or other breathing conditions.  Restless legs syndrome, sleep apnea, or other sleep disorders.  Chronic pain.  Menopause. This may include hot flashes.  Stroke.  Abuse of alcohol, tobacco, or illegal drugs.  Depression.  Caffeine.  Neurological disorders, such as Alzheimer disease.  An overactive thyroid (hyperthyroidism).  The cause of insomnia may not be known. What increases the risk? Risk factors for insomnia include:  Gender. Women are more commonly affected than men.  Age. Insomnia is more common as you get older.  Stress. This may involve your professional or personal life.  Income. Insomnia is more common in people with lower income.  Lack of exercise.  Irregular work schedule or night shifts.  Traveling between different time zones.  What are the signs or symptoms? If you have insomnia, trouble falling asleep or trouble staying asleep is the main symptom. This may lead to other symptoms, such as:  Feeling  fatigued.  Feeling nervous about going to sleep.  Not feeling rested in the morning.  Having trouble concentrating.  Feeling irritable, anxious, or depressed.  How is this treated? Treatment for insomnia depends on the cause. If your insomnia is caused by an underlying condition, treatment will focus on addressing the condition. Treatment may also include:  Medicines to help you sleep.  Counseling or therapy.  Lifestyle adjustments.  Follow these instructions at home:  Take medicines only as directed by your health care provider.  Keep regular sleeping and waking hours. Avoid naps.  Keep a sleep diary to help you and your health care provider figure out what could be causing your insomnia. Include: ? When you sleep. ? When you wake up during the night. ? How well you sleep. ? How rested you feel the next day. ? Any side effects of medicines you are taking. ? What you eat and drink.  Make your bedroom a comfortable place where it is easy to fall asleep: ? Put up shades or special blackout curtains to block light from outside. ? Use a white noise machine to block noise. ? Keep the temperature cool.  Exercise regularly as directed by your health care provider. Avoid exercising right before bedtime.  Use relaxation techniques to manage stress. Ask your health care provider to suggest some techniques that may work well for you. These may include: ? Breathing exercises. ? Routines to release muscle tension. ? Visualizing peaceful scenes.  Cut back on alcohol, caffeinated beverages, and cigarettes, especially close to bedtime. These can disrupt your sleep.  Do not overeat or eat spicy foods right before bedtime. This can lead to digestive discomfort that can make it  hard for you to sleep.  Limit screen use before bedtime. This includes: ? Watching TV. ? Using your smartphone, tablet, and computer.  Stick to a routine. This can help you fall asleep faster. Try to do a  quiet activity, brush your teeth, and go to bed at the same time each night.  Get out of bed if you are still awake after 15 minutes of trying to sleep. Keep the lights down, but try reading or doing a quiet activity. When you feel sleepy, go back to bed.  Make sure that you drive carefully. Avoid driving if you feel very sleepy.  Keep all follow-up appointments as directed by your health care provider. This is important. Contact a health care provider if:  You are tired throughout the day or have trouble in your daily routine due to sleepiness.  You continue to have sleep problems or your sleep problems get worse. Get help right away if:  You have serious thoughts about hurting yourself or someone else. This information is not intended to replace advice given to you by your health care provider. Make sure you discuss any questions you have with your health care provider. Document Released: 06/19/2000 Document Revised: 11/22/2015 Document Reviewed: 03/23/2014 Elsevier Interactive Patient Education  [employer].

## 2017-12-21 NOTE — Progress Notes (Signed)
Subjective:     Patient ID: Julie Norton, female   DOB: 03/20/1930, 84 y.o.   MRN: [id number]  HPI  Type 2 diabetes. Last A1c 7.8%. She's had some poor compliance with diet since then. She remains on metformin and long-acting insulin [name]) 18 units daily. Recent fasting blood sugars been somewhat elevated. Denies any polyuria or polydipsia. Her exercise has been limited recently.  Increased fatigue issues. She's not felt much like exercising recently. Not walking much. She's had some issues with insomnia with difficulty following asleep and staying asleep. Denies depression symptoms. No consistent alcohol use at night. No late day use of caffeine. Occasional naps.  Patient tried over-the-counter melatonin up to 5 mg but had hangover drowsiness. This also did not work well for her.  Past Medical History:  Diagnosis Date  . Abdominal adhesions   . Abdominal gas pain    suspected -- may be related to intermittent right upper quadrant discomfort radiating around her back      . Breast mass    left breast  . Cancer ([place])    left breast  . Chest pain    intermittent right upper quadrant discomfort radiating around her back      . Complication of anesthesia   . Diabetes mellitus without complication ([place])    Type II  . Dyspepsia   . GERD (gastroesophageal reflux disease)   . H/O: hysterectomy 1978  . Hypercholesteremia   . Hypothyroidism   . Personal history of radiation therapy   . Pneumonia   . PONV (postoperative nausea and vomiting)   . Tendinitis    of the right arm and shoulder  . Torn tendon    right shoulder   Past Surgical History:  Procedure Laterality Date  . ABDOMINAL HYSTERECTOMY    . APPENDECTOMY  1978  . BLADDER REPAIR    . BREAST LUMPECTOMY Left 02/24/2011   central partial mastectomy - dr Margot Chimes  . BREAST REDUCTION SURGERY    . CARDIOVERSION N/A 10/28/2017   Procedure: CARDIOVERSION;  Surgeon: Lelon Perla, MD;  Location: [facility] ENDOSCOPY;  Service:  Cardiovascular;  Laterality: N/A;  . COLONOSCOPY WITH PROPOFOL N/A 03/24/2017   Procedure: COLONOSCOPY WITH PROPOFOL;  Surgeon: Wilford Corner, MD;  Location: [place];  Service: Endoscopy;  Laterality: N/A;  . ESOPHAGOGASTRODUODENOSCOPY (EGD) WITH PROPOFOL N/A 03/24/2017   Procedure: ESOPHAGOGASTRODUODENOSCOPY (EGD) WITH PROPOFOL;  Surgeon: Wilford Corner, MD;  Location: [place];  Service: Endoscopy;  Laterality: N/A;  . LIPOMA EXCISION Right   . REDUCTION MAMMAPLASTY Bilateral 1998  . RETINAL DETACHMENT SURGERY Left   . SALPINGOOPHORECTOMY    . TEE WITHOUT CARDIOVERSION N/A 10/28/2017   Procedure: TRANSESOPHAGEAL ECHOCARDIOGRAM (TEE);  Surgeon: Lelon Perla, MD;  Location: [facility] ENDOSCOPY;  Service: Cardiovascular;  Laterality: N/A;  . [place]   at 84 years of age    reports that she has never smoked. She has never used smokeless tobacco. She reports that she does not drink alcohol or use drugs. family history includes Aortic aneurysm in her mother and sister; Cancer in her mother; Diabetes in her mother; Heart attack in her mother; Hypertension in her mother; Pneumonia in her father; Stroke in her mother. Allergies  Allergen Reactions  . Bactrim Nausea And Vomiting  . Dilaudid [Hydromorphone Hcl] Other (See Comments)    Unknown  . Hydrocodone Nausea And Vomiting  . Invokana [Canagliflozin] Itching and Other (See Comments)    Yeast Infections   . Macrodantin Nausea And  Vomiting    Review of Systems  Constitutional: Positive for fatigue.  Eyes: Negative for visual disturbance.  Respiratory: Negative for cough, chest tightness, shortness of breath and wheezing.   Cardiovascular: Negative for chest pain, palpitations and leg swelling.  Endocrine: Negative for polydipsia and polyuria.  Neurological: Negative for dizziness, seizures, syncope, weakness, light-headedness and headaches.  Psychiatric/Behavioral: Positive for sleep disturbance.  Negative for dysphoric mood.       Objective:   Physical Exam  Constitutional: She appears well-developed and well-nourished.  Eyes: Pupils are equal, round, and reactive to light.  Neck: Neck supple. No JVD present. No thyromegaly present.  Cardiovascular: Normal rate and regular rhythm. Exam reveals no gallop.  Pulmonary/Chest: Effort normal and breath sounds normal. No respiratory distress. She has no wheezes. She has no rales.  Musculoskeletal: She exhibits no edema.  Neurological: She is alert.       Assessment:     #1 type 2 diabetes poorly controlled A1c 9.3%. This is surprising since her A1c was 7.8% just a few months ago. Possibly related to poor compliance with diet and exercise  #2 chronic insomnia    Plan:     -Titrate. Tresiba up 2 units every 3 days until fasting blood sugars consistently around 130 or less -Handout given on sleep hygiene -Patient feels her sleep disturbance is causing increased fatigue and inability to exercise. Would avoid benzodiazepines given her age and also try to avoid Ambien. We agreed to short-term trial of trazodone 50 mg daily at bedtime as needed for severe insomnia -Routine follow-up in 3 months to reassess  Eulas Post MD [facility]

## 2017-12-24 ENCOUNTER — Telehealth: Payer: Self-pay | Admitting: Family Medicine

## 2017-12-24 DIAGNOSIS — E0590 Thyrotoxicosis, unspecified without thyrotoxic crisis or storm: Secondary | ICD-10-CM

## 2017-12-24 MED ORDER — LEVOTHYROXINE SODIUM 50 MCG PO TABS: ORAL_TABLET | ORAL | 1 refills | Status: DC

## 2017-12-24 MED ORDER — LEVOTHYROXINE SODIUM 50 MCG PO TABS: ORAL_TABLET | ORAL | 0 refills | Status: DC

## 2017-12-24 NOTE — Telephone Encounter (Signed)
Copied from [place] [id number]. Topic: General - Other >> Dec 24, 2017  8:45 AM Julie Norton R wrote: Reason for CRM: Pt is wanting an emergency refill amount of  levothyroxine (SYNTHROID, LEVOTHROID) 50 MCG tablet [id number] [address], [place] (Phone) [phone] (Fax)    She also would like someone to call her because the pharmacy would disclose med due to saying she has a health problem Callback # [phone]

## 2017-12-24 NOTE — Telephone Encounter (Signed)
Where are they getting dx of "thyrotoxicosis" ?  She does not have thyrotoxicosis and needs to remain on Levothyroxine.

## 2017-12-24 NOTE — Telephone Encounter (Signed)
Chor (Pharmacist with [facility]) states that:  According to fax sent yesterday,   Levothyroxine is contraindicated for thyrotoxicosis.   Please advise:  Please reference: Order#: [id number]  Call Back: [phone] Can speak to any pharmacist

## 2017-12-24 NOTE — Telephone Encounter (Signed)
Copied from [place] [id number]. Topic: Quick Communication - Rx Refill/Question >> Dec 24, 2017  8:43 AM Alfredia Ferguson R wrote: Medication:levothyroxine (SYNTHROID, LEVOTHROID) 50 MCG tablet Has the patient contacted their pharmacy? Yes (Agent: If no, request that the patient contact the pharmacy for the refill.) (Agent: If yes, when and what did the pharmacy advise?)  Preferred Pharmacy (with phone number or street name): [place], [place] [phone] (Phone) [phone] (Fax)      Agent: Please be advised that RX refills may take up to 3 business days. We ask that you follow-up with your pharmacy.

## 2017-12-24 NOTE — Telephone Encounter (Signed)
Spoke with pharmacist and patient.

## 2017-12-24 NOTE — Telephone Encounter (Signed)
[facility] per patient request

## 2017-12-24 NOTE — Telephone Encounter (Signed)
Copied from [place] [id number]. Topic: Quick Communication - See Telephone Encounter >> Dec 24, 2017 10:54 AM Bea Graff, NT wrote: CRM for notification. See Telephone encounter for: 12/24/17. Russell with [facility] calling in regards to levothyroxine and a recent diagnosis for thyrotoxicosis. He states he needs to verify that Dr. Elease Hashimoto is still ok to continue the medication regimen with that new diagnosis for this pt. CB#: [phone] Ref#: [id number]

## 2017-12-24 NOTE — Telephone Encounter (Signed)
°  [facility] [employer] [facility]) states that:  According to fax sent yesterday,   Levothyrosine is contraindicated for thyrotoxicosis.   She would like to ask if Dr Elease Hashimoto is aware and should pharmacy continue with the treatment levothyroxine (SYNTHROID, LEVOTHROID)  as prescribed in pt's Hx?  Please reference: Order#: [id number]  Call Back: [phone] Can speak to any pharmacist

## 2017-12-27 ENCOUNTER — Telehealth: Payer: Self-pay | Admitting: Cardiology

## 2017-12-27 ENCOUNTER — Encounter: Payer: Self-pay | Admitting: Cardiology

## 2017-12-27 NOTE — Telephone Encounter (Signed)
New Message:       1. What dental office are you calling from? Dr. Madilyn Fireman Saraf  2. What is your office phone number? [phone]  3. What is your fax number? [phone]  4. What type of procedure is the patient having performed? Removal of two teeth  5. What date is procedure scheduled or is the patient there now? TBD  6. What is your question (ex. Antibiotics prior to procedure, holding medication-we need to know how long dentist wants pt to hold med)? ELIQUIS 5 MG TABS tablet and 2 or 3 days prior to procedure

## 2017-12-28 ENCOUNTER — Telehealth: Payer: Self-pay

## 2017-12-28 NOTE — Telephone Encounter (Signed)
She can take an extra lasix as needed for swelling. She is more than 4 weeks out from her cardioversion so she can stop Eliquis for 2 days prior to dental procedure.  Elohim Brune Martinique MD, [facility]

## 2017-12-28 NOTE — Telephone Encounter (Signed)
Spoke with pt and advised of Dr. Jordan's recommendation. Pt verbalized understanding.  

## 2017-12-28 NOTE — Telephone Encounter (Signed)
   Primary Cardiologist:Peter Martinique, MD  Chart reviewed as part of pre-operative protocol coverage. It appears this has already been addressed in phone note from today as well.  Per Dr. Doug Sou message today,"She is more than 4 weeks out from her cardioversion so she can stop Eliquis for 2 days prior to dental procedure." Dr. Doug Sou nurse already relayed recommendation to patient.   Will route this bundled recommendation to requesting provider via Epic fax function. Please call with questions.  Charlie Pitter, PA-C 12/28/2017, 3:47 PM

## 2017-12-28 NOTE — Telephone Encounter (Signed)
Have Dr. Imagene Riches office fax over a clearance form stating what time of procedure you will be having, how long the eliquis will need to be held, and any type of anesthesia that will be used. Our fax number is [phone]. I will also forward conversation to Dr. Martinique for recommendation as far as increasing your lasix.   ===View-only below this line===   ----- Message -----    From: Julie Norton    Sent: 12/27/2017  8:50 PM EDT      To: Julie Martinique, MD Subject: Non-Urgent Medical Question  Martin Majestic to dentist today and I have 2 abscessed teeth which have to be abstracted and Dr. Imagene Riches  wanted me to contact you to see if I can not take the Eliquis for awhile.  My big concern is that one of the teeth really bad & may begin unbearable pain at any time.  I don't have appointment until 7/29 unless the pain begins.  Left message for a call today with more details but haven't been contacted.  When they call I do have more questions.  Also, my feet & ankles are still swollen after taking 1 Furosemide 20mg .  Is it ok to take 2 tablets?  Thanks!  Julie Norton

## 2018-01-04 ENCOUNTER — Other Ambulatory Visit: Payer: Self-pay | Admitting: Adult Health

## 2018-01-04 DIAGNOSIS — Z1231 Encounter for screening mammogram for malignant neoplasm of breast: Secondary | ICD-10-CM

## 2018-02-14 DIAGNOSIS — Z01419 Encounter for gynecological examination (general) (routine) without abnormal findings: Secondary | ICD-10-CM | POA: Diagnosis not present

## 2018-02-14 DIAGNOSIS — Z124 Encounter for screening for malignant neoplasm of cervix: Secondary | ICD-10-CM | POA: Diagnosis not present

## 2018-02-14 DIAGNOSIS — N958 Other specified menopausal and perimenopausal disorders: Secondary | ICD-10-CM | POA: Diagnosis not present

## 2018-02-14 DIAGNOSIS — M8588 Other specified disorders of bone density and structure, other site: Secondary | ICD-10-CM | POA: Diagnosis not present

## 2018-02-15 ENCOUNTER — Other Ambulatory Visit: Payer: Self-pay | Admitting: Obstetrics & Gynecology

## 2018-02-15 DIAGNOSIS — Z853 Personal history of malignant neoplasm of breast: Secondary | ICD-10-CM

## 2018-02-16 ENCOUNTER — Ambulatory Visit: Payer: Medicare Other

## 2018-02-22 ENCOUNTER — Ambulatory Visit: Payer: Medicare Other

## 2018-02-22 ENCOUNTER — Ambulatory Visit
Admission: RE | Admit: 2018-02-22 | Discharge: 2018-02-22 | Disposition: A | Discharge status: Home / Self Care | Payer: Medicare Other | Source: Ambulatory Visit | Attending: Obstetrics & Gynecology | Admitting: Obstetrics & Gynecology

## 2018-02-22 DIAGNOSIS — Z853 Personal history of malignant neoplasm of breast: Secondary | ICD-10-CM

## 2018-02-22 DIAGNOSIS — R922 Inconclusive mammogram: Secondary | ICD-10-CM | POA: Diagnosis not present

## 2018-02-22 HISTORY — DX: Malignant neoplasm of unspecified site of unspecified female breast: C50.919

## 2018-03-01 ENCOUNTER — Other Ambulatory Visit: Payer: Self-pay | Admitting: Family Medicine

## 2018-03-12 NOTE — Progress Notes (Signed)
Cardiology Office Note   Date:  03/16/2018   ID:  Julie Norton, DOB 03-19-1930, MRN [id number]  PCP:  Julie Post, Norton  Cardiologist: Julie Martinique Norton  Chief Complaint  Patient presents with  . Follow-up    4 months  . Atrial Fibrillation      History of Present Illness: Julie Norton is a 84 y.o. female who presents for follow up of Atrial fibrillation.   She has a past history of hypercholesterolemia. She also has a history of DM managed with oral agents but is now on insulin. Echo in 2015 showed mild MR with normal LV function. She had a normal Myoview study in November 2017.   She did undergo GI evaluation in September 2018  for iron deficiency anemia. She had a small hiatal hernia and diverticulosis. One small rectal polyp.   She was seen by Julie Norton on 09/20/17. Found to be in Afib with rate 95. Started on Eliquis. Follow up Echo showed mild biatrial enlargement and normal LV function. She  noted some fatigue, decreased energy and leg swelling. Some dyspnea and chest tightness. Was started on lasix with improvement of these symptoms. She subsequently underwent TEE guided DCCV on 10/28/17 with return to NSR.   On follow up today she is doing well. Still feels tired but no Afib. Denies chest pain or dyspnea.  Still has some swelling in legs. Has occ. Abdominal cramps.   Past Medical History:  Diagnosis Date  . Abdominal adhesions   . Abdominal gas pain    suspected -- may be related to intermittent right upper quadrant discomfort radiating around her back      . Breast cancer ([place])   . Breast mass    left breast  . Cancer ([place])    left breast  . Chest pain    intermittent right upper quadrant discomfort radiating around her back      . Complication of anesthesia   . Diabetes mellitus without complication ([place])    Type II  . Dyspepsia   . GERD (gastroesophageal reflux disease)   . H/O: hysterectomy 1978  . Hypercholesteremia   . Hypothyroidism     . Personal history of radiation therapy   . Pneumonia   . PONV (postoperative nausea and vomiting)   . Tendinitis    of the right arm and shoulder  . Torn tendon    right shoulder    Past Surgical History:  Procedure Laterality Date  . ABDOMINAL HYSTERECTOMY    . APPENDECTOMY  1978  . BLADDER REPAIR    . BREAST LUMPECTOMY Left 02/24/2011   central partial mastectomy - dr Julie Norton  . BREAST REDUCTION SURGERY    . CARDIOVERSION N/A 10/28/2017   Procedure: CARDIOVERSION;  Surgeon: Julie Norton;  [facility] ENDOSCOPY;  Service: Cardiovascular;  Laterality: N/A;  . COLONOSCOPY WITH PROPOFOL N/A 03/24/2017   Procedure: COLONOSCOPY WITH PROPOFOL;  Surgeon: Julie Corner, Norton;  Location: [place];  Service: Endoscopy;  Laterality: N/A;  . ESOPHAGOGASTRODUODENOSCOPY (EGD) WITH PROPOFOL N/A 03/24/2017   Procedure: ESOPHAGOGASTRODUODENOSCOPY (EGD) WITH PROPOFOL;  Surgeon: Julie Corner, Norton;  Location: [place];  Service: Endoscopy;  Laterality: N/A;  . LIPOMA EXCISION Right   . REDUCTION MAMMAPLASTY Bilateral 1998  . RETINAL DETACHMENT SURGERY Left   . SALPINGOOPHORECTOMY    . TEE WITHOUT CARDIOVERSION N/A 10/28/2017   Procedure: TRANSESOPHAGEAL ECHOCARDIOGRAM (TEE);  Surgeon: Julie Norton;  Location: [place];  Service: Cardiovascular;  Laterality:  N/A;  . TONSILLECTOMY AND ADENOIDECTOMY  1950   at 84 years of age     Current Outpatient Medications  Medication Sig Dispense Refill  . atorvastatin (LIPITOR) 10 MG tablet TAKE ONE-HALF TABLET BY  MOUTH EVERY OTHER DAY OR AS DIRECTED 45 tablet 1  . Calcium Carbonate-Vitamin D (CALCIUM 600+D) 600-200 MG-UNIT TABS Take 1 tablet by mouth 3 (three) times a week.     . Cholecalciferol (VITAMIN D PO) Take 1 tablet by mouth daily.     Marland Kitchen ELIQUIS 5 MG TABS tablet TAKE 1 TABLET BY MOUTH TWICE A DAY 180 tablet 1  . famotidine (PEPCID) 20 MG tablet Take 20 mg by mouth 2 (two) times daily.    Marland Kitchen glucosamine-chondroitin  500-400 MG tablet Take 1 tablet by mouth 3 (three) times a week.     Marland Kitchen glucose blood (ONE TOUCH ULTRA TEST) test strip Check once daily. E11.40 100 each 11  . insulin degludec (TRESIBA FLEXTOUCH) 100 UNIT/ML SOPN FlexTouch Pen Inject 0.14 mLs (14 Units total) into the skin daily at 10 pm. (Patient taking differently: Inject 24 Units into the skin daily at 10 pm. ) 5 pen 3  . Insulin Pen Needle 29G X 5MM MISC Use once daily 100 each 3  . levothyroxine (SYNTHROID, LEVOTHROID) 50 MCG tablet TAKE 50 MCG BY MOUTH DAILY 30 tablet 0  . metFORMIN (GLUCOPHAGE-XR) 500 MG 24 hr tablet TAKE 1 TABLET BY MOUTH  TWICE A DAY 180 tablet 1  . ONE TOUCH ULTRA TEST test strip CHECK ONCE A DAY 50 each 3  . traZODone (DESYREL) 50 MG tablet Take 0.5-1 tablets (25-50 mg total) by mouth at bedtime as needed for sleep. 30 tablet 3  . triamcinolone cream (KENALOG) 0.1 % Apply 1 application topically 2 (two) times daily as needed. (Patient taking differently: Apply 1 application topically 2 (two) times daily as needed (for itching). ) 30 g 2  . furosemide (LASIX) 20 MG tablet Take 1 tablet (20 mg total) by mouth daily. (Patient taking differently: Take 20 mg by mouth as needed. ) 30 tablet 6   Current Facility-Administered Medications  Medication Dose Route Frequency Provider Last Rate Last Dose  . aspirin chewable tablet 81 mg  81 mg Oral Once Norton, Julie Norton, Norton        Allergies:   Bactrim; Dilaudid [hydromorphone hcl]; Hydrocodone; Invokana [canagliflozin]; and Macrodantin    Social History:  The patient  reports that she has never smoked. She has never used smokeless tobacco. She reports that she does not drink alcohol or use drugs.   Family History:  The patient's family history includes Aortic aneurysm in her mother and sister; Cancer in her mother; Diabetes in her mother; Heart attack in her mother; Hypertension in her mother; Pneumonia in her father; Stroke in her mother.    ROS:  Please see the history of  present illness.   Otherwise, review of systems are positive for none.   All other systems are reviewed and negative.    PHYSICAL EXAM: VS:  BP 120/68 (BP Location: Left Arm, Patient Position: Sitting, Cuff Size: Normal)   Pulse 79   Ht 5' 1.5" (1.562 Norton)   Wt 149 lb (67.6 kg)   BMI 27.70 kg/Norton  , BMI Body mass index is 27.7 kg/Norton. GENERAL:  Well appearing WF in NAD HEENT:  PERRL, EOMI, sclera are clear. Oropharynx is clear. NECK:  No jugular venous distention, carotid upstroke brisk and symmetric, no bruits, no thyromegaly or adenopathy  LUNGS:  Clear to auscultation bilaterally CHEST:  Unremarkable HEART:  RRR,  PMI not displaced or sustained,S1 and S2 within normal limits, no S3, no S4: no clicks, no rubs, no murmurs ABD:  Soft, nontender. BS +, no masses or bruits. No hepatomegaly, no splenomegaly EXT:  2 + pulses throughout, tr edema, no cyanosis no clubbing SKIN:  Warm and dry.  No rashes NEURO:  Alert and oriented x 3. Cranial nerves II through XII intact. PSYCH:  Cognitively intact     EKG:  EKG is  Not ordered today.    Recent Labs: 04/26/2017: ALT 12 09/20/2017: TSH 1.61 10/12/2017: BUN 12; Creatinine, Ser 0.78; Hemoglobin 11.2; Platelets 189; Potassium 4.4; Sodium 141    Lipid Panel    Component Value Date/Time   CHOL 135 04/26/2017 0840   TRIG 100 04/26/2017 0840   HDL 45 04/26/2017 0840   CHOLHDL 3.0 04/26/2017 0840   CHOLHDL 5.0 (H) 07/30/2016 0808   VLDL 43 (H) 07/30/2016 0808   LDLCALC 70 04/26/2017 0840      Wt Readings from Last 3 Encounters:  03/16/18 149 lb (67.6 kg)  12/21/17 146 lb 9.6 oz (66.5 kg)  11/30/17 149 lb 4.8 oz (67.7 kg)      Lexiscan myoview 05/26/16:Study Highlights    Nuclear stress EF: 72%.  There was no ST segment deviation noted during stress.  This is a low risk study.  The left ventricular ejection fraction is hyperdynamic (>65%).   1. Small, mild mid-anteroseptal perfusion defect.  This defect was actually more  intense at rest than with stress.  No ischemia.  Suspect attenuation.  2. EF 72% with normal wall motion.  3. Overall low risk study.     Echo 09/24/17: Study Conclusions  - Left ventricle: The cavity size was normal. Systolic function was   normal. The estimated ejection fraction was in the range of 55%   to 60%. Wall motion was normal; there were no regional wall   motion abnormalities. - Mitral valve: There was mild regurgitation. - Left atrium: The atrium was mildly dilated. - Right atrium: The atrium was mildly dilated. - Atrial septum: No defect or patent foramen ovale was identified. - Pulmonary arteries: Systolic pressure was mildly increased. PA   peak pressure: 37 mm Hg (S).    ASSESSMENT AND PLAN:  1. Atrial fibrillation. [place] vasc score of 4. Now on Eliquis at appropriate dose of 5 mg bid.   S/p TEE guided DCCV. Maintaining NSR so far. Continue Eliquis indefinitely. Is due for follow up  CBC and CMET  2.  Hypercholesterolemia-  She is at goal. Continue low dose lipitor. Arrange fasting lipid panel.  3. History of breast cancer of left breast treated with lumpectomy and radiation therapy  4. Diabetes mellitus- now on insulin.  Encouraged sodium restriction.     Follow up in 6 months  Signed, Julie Martinique Norton, [facility]   03/16/2018 2:53 PM    [place]

## 2018-03-13 DIAGNOSIS — Z794 Long term (current) use of insulin: Secondary | ICD-10-CM | POA: Diagnosis not present

## 2018-03-13 DIAGNOSIS — E119 Type 2 diabetes mellitus without complications: Secondary | ICD-10-CM | POA: Diagnosis not present

## 2018-03-16 ENCOUNTER — Ambulatory Visit: Payer: Medicare Other | Admitting: Cardiology

## 2018-03-16 ENCOUNTER — Encounter: Payer: Self-pay | Admitting: Cardiology

## 2018-03-16 ENCOUNTER — Telehealth: Payer: Self-pay | Admitting: Family Medicine

## 2018-03-16 VITALS — BP 120/68 | HR 79 | Ht 61.5 in | Wt 149.0 lb

## 2018-03-16 DIAGNOSIS — E7800 Pure hypercholesterolemia, unspecified: Secondary | ICD-10-CM | POA: Diagnosis not present

## 2018-03-16 DIAGNOSIS — I481 Persistent atrial fibrillation: Secondary | ICD-10-CM

## 2018-03-16 DIAGNOSIS — E119 Type 2 diabetes mellitus without complications: Secondary | ICD-10-CM | POA: Diagnosis not present

## 2018-03-16 DIAGNOSIS — Z794 Long term (current) use of insulin: Secondary | ICD-10-CM | POA: Diagnosis not present

## 2018-03-16 DIAGNOSIS — I4819 Other persistent atrial fibrillation: Secondary | ICD-10-CM

## 2018-03-16 NOTE — Telephone Encounter (Signed)
Patient needs a call- she saw her cardiologist and is instructed her to get more tests run from Dr Elease Hashimoto. Patient left orders for Dr Elease Hashimoto to look at.  She wanted him to have them.

## 2018-03-16 NOTE — Patient Instructions (Signed)
Continue your current therapy  I will see you in 6 months.   

## 2018-03-16 NOTE — Telephone Encounter (Signed)
Spoke with patient and she declines labs at this time.  Orders are available if patient would like to have the labs at a future date.

## 2018-03-28 ENCOUNTER — Encounter: Payer: Self-pay | Admitting: Family Medicine

## 2018-03-28 ENCOUNTER — Other Ambulatory Visit: Payer: Self-pay

## 2018-03-28 ENCOUNTER — Ambulatory Visit (INDEPENDENT_AMBULATORY_CARE_PROVIDER_SITE_OTHER): Payer: Medicare Other | Admitting: Family Medicine

## 2018-03-28 VITALS — BP 136/78 | HR 67 | Temp 97.7°F | Ht 61.5 in | Wt 147.7 lb

## 2018-03-28 DIAGNOSIS — E11628 Type 2 diabetes mellitus with other skin complications: Secondary | ICD-10-CM | POA: Diagnosis not present

## 2018-03-28 DIAGNOSIS — Z23 Encounter for immunization: Secondary | ICD-10-CM | POA: Diagnosis not present

## 2018-03-28 DIAGNOSIS — E7800 Pure hypercholesterolemia, unspecified: Secondary | ICD-10-CM | POA: Diagnosis not present

## 2018-03-28 DIAGNOSIS — L84 Corns and callosities: Secondary | ICD-10-CM | POA: Diagnosis not present

## 2018-03-28 DIAGNOSIS — K219 Gastro-esophageal reflux disease without esophagitis: Secondary | ICD-10-CM

## 2018-03-28 LAB — COMPREHENSIVE METABOLIC PANEL
ALT: 13 U/L (ref 0–35)
AST: 19 U/L (ref 0–37)
Albumin: 4.3 g/dL (ref 3.5–5.2)
Alkaline Phosphatase: 58 U/L (ref 39–117)
BUN: 11 mg/dL (ref 6–23)
CHLORIDE: 105 meq/L (ref 96–112)
CO2: 27 meq/L (ref 19–32)
Calcium: 9.8 mg/dL (ref 8.4–10.5)
Creatinine, Ser: 0.73 mg/dL (ref 0.40–1.20)
GFR: 80.35 mL/min (ref >60.00)
GLUCOSE: 106 mg/dL — AB (ref 70–99)
POTASSIUM: 4.3 meq/L (ref 3.5–5.1)
SODIUM: 140 meq/L (ref 135–145)
Total Bilirubin: 0.8 mg/dL (ref 0.2–1.2)
Total Protein: 6.9 g/dL (ref 6.0–8.3)

## 2018-03-28 LAB — LIPID PANEL
CHOL/HDL RATIO: 3
CHOLESTEROL: 152 mg/dL (ref 0–200)
HDL: 45.1 mg/dL (ref >39.00)
LDL CALC: 82 mg/dL (ref 0–99)
NonHDL: 106.87
Triglycerides: 123 mg/dL (ref 0.0–149.0)
VLDL: 24.6 mg/dL (ref 0.0–40.0)

## 2018-03-28 LAB — POCT GLYCOSYLATED HEMOGLOBIN (HGB A1C): HEMOGLOBIN A1C: 7.9 % — AB (ref 4.0–5.6)

## 2018-03-28 LAB — CBC WITH DIFFERENTIAL/PLATELET
Basophils Absolute: 0 10*3/uL (ref 0.0–0.1)
Basophils Relative: 0.5 % (ref 0.0–3.0)
Eosinophils Absolute: 0.1 10*3/uL (ref 0.0–0.7)
Eosinophils Relative: 2 % (ref 0.0–5.0)
HCT: 34.3 % — ABNORMAL LOW (ref 36.0–46.0)
Hemoglobin: 11.3 g/dL — ABNORMAL LOW (ref 12.0–15.0)
Lymphocytes Relative: 29.6 % (ref 12.0–46.0)
Lymphs Abs: 1.6 10*3/uL (ref 0.7–4.0)
MCHC: 32.9 g/dL (ref 30.0–36.0)
MCV: 75.6 fl — ABNORMAL LOW (ref 78.0–100.0)
Monocytes Absolute: 0.5 10*3/uL (ref 0.1–1.0)
Monocytes Relative: 9.7 % (ref 3.0–12.0)
Neutro Abs: 3.1 10*3/uL (ref 1.4–7.7)
Neutrophils Relative %: 58.2 % (ref 43.0–77.0)
Platelets: 186 10*3/uL (ref 150.0–400.0)
RBC: 4.54 Mil/uL (ref 3.87–5.11)
RDW: 15.3 % (ref 11.5–15.5)
WBC: 5.3 10*3/uL (ref 4.0–10.5)

## 2018-03-28 NOTE — Progress Notes (Signed)
Subjective:     Patient ID: Julie Norton, female   DOB: [birth year], 84 y.o.   MRN: [id number]  HPI  Type 2 diabetes. Recent poor controlled A1c 9.3%. We had her titrate up her Tresiba insulin- currently 24 units. Fasting blood sugar this morning 122. A1c down to 7.9%. No recent hypoglycemia. Still has occasional loose stool with metformin but infrequently.  She has history of GERD. Had been on Pepcid but has had some recent mild epigastric burning. Started back omeprazole couple days ago which she thinks is helping.  Recent insomnia issues. She has seen some benefit with trazodone. Currently taking one half tablet at night  She has chronic atrial fibrillation and remains on eliquis. No recent dyspnea. No chest pains. No dizziness  Past Medical History:  Diagnosis Date  . Abdominal adhesions   . Abdominal gas pain    suspected -- may be related to intermittent right upper quadrant discomfort radiating around her back      . Breast cancer ([place])   . Breast mass    left breast  . Cancer ([place])    left breast  . Chest pain    intermittent right upper quadrant discomfort radiating around her back      . Complication of anesthesia   . Diabetes mellitus without complication ([place])    Type II  . Dyspepsia   . GERD (gastroesophageal reflux disease)   . H/O: hysterectomy 1978  . Hypercholesteremia   . Hypothyroidism   . Personal history of radiation therapy   . Pneumonia   . PONV (postoperative nausea and vomiting)   . Tendinitis    of the right arm and shoulder  . Torn tendon    right shoulder   Past Surgical History:  Procedure Laterality Date  . ABDOMINAL HYSTERECTOMY    . APPENDECTOMY  1978  . BLADDER REPAIR    . BREAST LUMPECTOMY Left 02/24/2011   central partial mastectomy - dr Margot Chimes  . BREAST REDUCTION SURGERY    . CARDIOVERSION N/A 10/28/2017   Procedure: CARDIOVERSION;  Surgeon: Lelon Perla, MD;  Location: [facility] ENDOSCOPY;  Service: Cardiovascular;  Laterality:  N/A;  . COLONOSCOPY WITH PROPOFOL N/A 03/24/2017   Procedure: COLONOSCOPY WITH PROPOFOL;  Surgeon: Wilford Corner, MD;  Location: [place];  Service: Endoscopy;  Laterality: N/A;  . ESOPHAGOGASTRODUODENOSCOPY (EGD) WITH PROPOFOL N/A 03/24/2017   Procedure: ESOPHAGOGASTRODUODENOSCOPY (EGD) WITH PROPOFOL;  Surgeon: Wilford Corner, MD;  Location: [place];  Service: Endoscopy;  Laterality: N/A;  . LIPOMA EXCISION Right   . REDUCTION MAMMAPLASTY Bilateral 1998  . RETINAL DETACHMENT SURGERY Left   . SALPINGOOPHORECTOMY    . TEE WITHOUT CARDIOVERSION N/A 10/28/2017   Procedure: TRANSESOPHAGEAL ECHOCARDIOGRAM (TEE);  Surgeon: Lelon Perla, MD;  Location: [facility] ENDOSCOPY;  Service: Cardiovascular;  Laterality: N/A;  . [place]   at 84 years of age    reports that she has never smoked. She has never used smokeless tobacco. She reports that she does not drink alcohol or use drugs. family history includes Aortic aneurysm in her mother and sister; Cancer in her mother; Diabetes in her mother; Heart attack in her mother; Hypertension in her mother; Pneumonia in her father; Stroke in her mother. Allergies  Allergen Reactions  . Bactrim Nausea And Vomiting  . Dilaudid [Hydromorphone Hcl] Other (See Comments)    Unknown  . Hydrocodone Nausea And Vomiting  . Invokana [Canagliflozin] Itching and Other (See Comments)    Yeast Infections   .  Macrodantin Nausea And Vomiting     Review of Systems  Constitutional: Negative for fatigue.  HENT: Negative for trouble swallowing.   Eyes: Negative for visual disturbance.  Respiratory: Negative for cough, chest tightness, shortness of breath and wheezing.   Cardiovascular: Negative for chest pain, palpitations and leg swelling.  Gastrointestinal: Positive for abdominal pain. Negative for abdominal distention and vomiting.  Endocrine: Negative for polydipsia and polyuria.  Neurological: Negative for dizziness,  seizures, syncope, weakness, light-headedness and headaches.       Objective:   Physical Exam  Constitutional: She appears well-developed.  HENT:  Mouth/Throat: Oropharynx is clear and moist.  Cardiovascular:  Irregular rhythm rate controlled  Pulmonary/Chest: Effort normal and breath sounds normal.  Musculoskeletal: She exhibits no edema.       Assessment:     #1 type 2 diabetes improved with A1c 7.9%  #2 chronic atrial fibrillation on eliquis. Rate controlled  #3 chronic insomnia improved with trazodone  #4 hyperlipidemia    Plan:     -flu vaccine given -Check labs today with comprehensive metabolic panel, CBC, lipid panel -continue current dose of Tresiba insulin -We'll plan routine follow-up in 3 months and sooner as needed  Eulas Post MD [facility] at [facility]

## 2018-04-15 ENCOUNTER — Other Ambulatory Visit: Payer: Self-pay | Admitting: Family Medicine

## 2018-04-15 DIAGNOSIS — E0590 Thyrotoxicosis, unspecified without thyrotoxic crisis or storm: Secondary | ICD-10-CM

## 2018-05-25 ENCOUNTER — Ambulatory Visit (INDEPENDENT_AMBULATORY_CARE_PROVIDER_SITE_OTHER): Payer: Medicare Other | Admitting: Family Medicine

## 2018-05-25 ENCOUNTER — Other Ambulatory Visit: Payer: Self-pay

## 2018-05-25 ENCOUNTER — Encounter: Payer: Self-pay | Admitting: Family Medicine

## 2018-05-25 VITALS — BP 128/72 | HR 81 | Temp 98.3°F | Ht 61.5 in | Wt 150.6 lb

## 2018-05-25 DIAGNOSIS — J209 Acute bronchitis, unspecified: Secondary | ICD-10-CM | POA: Diagnosis not present

## 2018-05-25 MED ORDER — DOXYCYCLINE HYCLATE 100 MG PO CAPS: 100.0000 mg | ORAL_CAPSULE | Freq: Two times a day (BID) | ORAL | 0 refills | Status: DC

## 2018-05-25 NOTE — Progress Notes (Signed)
Subjective:     Patient ID: Julie Norton, female   DOB: 1930-03-19, 84 y.o.   MRN: [id number]  HPI Patient is seen with cough.  Onset this past Saturday.  Husband had similar illness which started couple weeks ago.  She does have cough productive of thick green sputum.  No fevers or chills.  Using over-the-counter Mucinex.  No dyspnea.  No pleuritic pain.  Denies any nausea or vomiting.  Cough has been moderate to severe at times  Past Medical History:  Diagnosis Date  . Abdominal adhesions   . Abdominal gas pain    suspected -- may be related to intermittent right upper quadrant discomfort radiating around her back      . Breast cancer ([place])   . Breast mass    left breast  . Cancer ([place])    left breast  . Chest pain    intermittent right upper quadrant discomfort radiating around her back      . Complication of anesthesia   . Diabetes mellitus without complication ([place])    Type II  . Dyspepsia   . GERD (gastroesophageal reflux disease)   . H/O: hysterectomy 1978  . Hypercholesteremia   . Hypothyroidism   . Personal history of radiation therapy   . Pneumonia   . PONV (postoperative nausea and vomiting)   . Tendinitis    of the right arm and shoulder  . Torn tendon    right shoulder   Past Surgical History:  Procedure Laterality Date  . ABDOMINAL HYSTERECTOMY    . APPENDECTOMY  1978  . BLADDER REPAIR    . BREAST LUMPECTOMY Left 02/24/2011   central partial mastectomy - dr Margot Chimes  . BREAST REDUCTION SURGERY    . CARDIOVERSION N/A 10/28/2017   Procedure: CARDIOVERSION;  Surgeon: Lelon Perla, MD;  Location: [facility] ENDOSCOPY;  Service: Cardiovascular;  Laterality: N/A;  . COLONOSCOPY WITH PROPOFOL N/A 03/24/2017   Procedure: COLONOSCOPY WITH PROPOFOL;  Surgeon: Wilford Corner, MD;  Location: [place];  Service: Endoscopy;  Laterality: N/A;  . ESOPHAGOGASTRODUODENOSCOPY (EGD) WITH PROPOFOL N/A 03/24/2017   Procedure: ESOPHAGOGASTRODUODENOSCOPY (EGD) WITH PROPOFOL;   Surgeon: Wilford Corner, MD;  Location: [place];  Service: Endoscopy;  Laterality: N/A;  . LIPOMA EXCISION Right   . REDUCTION MAMMAPLASTY Bilateral 1998  . RETINAL DETACHMENT SURGERY Left   . SALPINGOOPHORECTOMY    . TEE WITHOUT CARDIOVERSION N/A 10/28/2017   Procedure: TRANSESOPHAGEAL ECHOCARDIOGRAM (TEE);  Surgeon: Lelon Perla, MD;  Location: [facility] ENDOSCOPY;  Service: Cardiovascular;  Laterality: N/A;  . [place]   at 84 years of age    reports that she has never smoked. She has never used smokeless tobacco. She reports that she does not drink alcohol or use drugs. family history includes Aortic aneurysm in her mother and sister; Cancer in her mother; Diabetes in her mother; Heart attack in her mother; Hypertension in her mother; Pneumonia in her father; Stroke in her mother. Allergies  Allergen Reactions  . Bactrim Nausea And Vomiting  . Dilaudid [Hydromorphone Hcl] Other (See Comments)    Unknown  . Hydrocodone Nausea And Vomiting  . Invokana [Canagliflozin] Itching and Other (See Comments)    Yeast Infections   . Macrodantin Nausea And Vomiting     Review of Systems  Constitutional: Positive for fatigue. Negative for chills and fever.  Respiratory: Positive for cough. Negative for shortness of breath and wheezing.   Cardiovascular: Negative for chest pain.  Objective:   Physical Exam  Constitutional: She appears well-developed and well-nourished.  HENT:  Right Ear: External ear normal.  Left Ear: External ear normal.  Mouth/Throat: Oropharynx is clear and moist.  Neck: Neck supple.  Cardiovascular: Normal rate and regular rhythm.  Pulmonary/Chest: Effort normal and breath sounds normal. She has no wheezes. She has no rales.  Lymphadenopathy:    She has no cervical adenopathy.       Assessment:     Productive cough.  Suspect acute bronchitis.  No respiratory distress.       Plan:     -Continue over-the-counter  Mucinex as needed -Doxycycline 100 mg twice daily for 7 days -Follow-up immediately for any fever or increasing shortness of breath  Eulas Post MD [facility] at [facility]

## 2018-05-25 NOTE — Patient Instructions (Signed)
Follow up immediately for and fever or increased shortness of breath.

## 2018-06-10 ENCOUNTER — Other Ambulatory Visit: Payer: Self-pay | Admitting: Family Medicine

## 2018-06-21 ENCOUNTER — Ambulatory Visit (INDEPENDENT_AMBULATORY_CARE_PROVIDER_SITE_OTHER): Payer: Medicare Other | Admitting: Family Medicine

## 2018-06-21 ENCOUNTER — Other Ambulatory Visit: Payer: Self-pay

## 2018-06-21 ENCOUNTER — Encounter: Payer: Self-pay | Admitting: Family Medicine

## 2018-06-21 VITALS — BP 110/66 | HR 65 | Temp 98.4°F | Ht 61.5 in | Wt 148.2 lb

## 2018-06-21 DIAGNOSIS — E119 Type 2 diabetes mellitus without complications: Secondary | ICD-10-CM | POA: Diagnosis not present

## 2018-06-21 DIAGNOSIS — E039 Hypothyroidism, unspecified: Secondary | ICD-10-CM

## 2018-06-21 DIAGNOSIS — L84 Corns and callosities: Secondary | ICD-10-CM

## 2018-06-21 DIAGNOSIS — E11628 Type 2 diabetes mellitus with other skin complications: Secondary | ICD-10-CM | POA: Diagnosis not present

## 2018-06-21 DIAGNOSIS — Z794 Long term (current) use of insulin: Secondary | ICD-10-CM | POA: Diagnosis not present

## 2018-06-21 DIAGNOSIS — I4819 Other persistent atrial fibrillation: Secondary | ICD-10-CM

## 2018-06-21 DIAGNOSIS — E7800 Pure hypercholesterolemia, unspecified: Secondary | ICD-10-CM

## 2018-06-21 LAB — POCT GLYCOSYLATED HEMOGLOBIN (HGB A1C): HEMOGLOBIN A1C: 7 % — AB (ref 4.0–5.6)

## 2018-06-21 NOTE — Progress Notes (Signed)
Subjective:     Patient ID: Julie Norton, female   DOB: 09-28-1929, 84 y.o.   MRN: [id number]  HPI Patient seen for follow-up regarding type 2 diabetes.  She had poorly controlled diabetes last year and we added insulin.  She currently takes [place] and is actually been able to decrease her dose recently.  She had gotten up to a high of 28 units and is now on 18 units and fasting blood sugars are mostly below 130.  A1c today 7.0%.  She also remains on metformin.  She is asking for samples as she is currently in the "donut hole ".  Her other medical problems include hypothyroidism, history of atrial fibrillation, osteoarthritis, hyperlipidemia.  Her other medications are reviewed.  Compliant with all.  She remains on Eliquis and has had no recent bleeding complications.  No chest pains.  Past Medical History:  Diagnosis Date  . Abdominal adhesions   . Abdominal gas pain    suspected -- may be related to intermittent right upper quadrant discomfort radiating around her back      . Breast cancer ([place])   . Breast mass    left breast  . Cancer ([place])    left breast  . Chest pain    intermittent right upper quadrant discomfort radiating around her back      . Complication of anesthesia   . Diabetes mellitus without complication ([place])    Type II  . Dyspepsia   . GERD (gastroesophageal reflux disease)   . H/O: hysterectomy 1978  . Hypercholesteremia   . Hypothyroidism   . Personal history of radiation therapy   . Pneumonia   . PONV (postoperative nausea and vomiting)   . Tendinitis    of the right arm and shoulder  . Torn tendon    right shoulder   Past Surgical History:  Procedure Laterality Date  . ABDOMINAL HYSTERECTOMY    . APPENDECTOMY  1978  . BLADDER REPAIR    . BREAST LUMPECTOMY Left 02/24/2011   central partial mastectomy - dr Margot Chimes  . BREAST REDUCTION SURGERY    . CARDIOVERSION N/A 10/28/2017   Procedure: CARDIOVERSION;  Surgeon: Lelon Perla, MD;  Location[facility]  ENDOSCOPY;  Service: Cardiovascular;  Laterality: N/A;  . COLONOSCOPY WITH PROPOFOL N/A 03/24/2017   Procedure: COLONOSCOPY WITH PROPOFOL;  Surgeon: Wilford Corner, MD;  Location: [place];  Service: Endoscopy;  Laterality: N/A;  . ESOPHAGOGASTRODUODENOSCOPY (EGD) WITH PROPOFOL N/A 03/24/2017   Procedure: ESOPHAGOGASTRODUODENOSCOPY (EGD) WITH PROPOFOL;  Surgeon: Wilford Corner, MD;  Location: [place];  Service: Endoscopy;  Laterality: N/A;  . LIPOMA EXCISION Right   . REDUCTION MAMMAPLASTY Bilateral 1998  . RETINAL DETACHMENT SURGERY Left   . SALPINGOOPHORECTOMY    . TEE WITHOUT CARDIOVERSION N/A 10/28/2017   Procedure: TRANSESOPHAGEAL ECHOCARDIOGRAM (TEE);  Surgeon: Lelon Perla, MD;  Location: [facility] ENDOSCOPY;  Service: Cardiovascular;  Laterality: N/A;  . [place]   at 84 years of age    reports that she has never smoked. She has never used smokeless tobacco. She reports that she does not drink alcohol or use drugs. family history includes Aortic aneurysm in her mother and sister; Cancer in her mother; Diabetes in her mother; Heart attack in her mother; Hypertension in her mother; Pneumonia in her father; Stroke in her mother. Allergies  Allergen Reactions  . Bactrim Nausea And Vomiting  . Dilaudid [Hydromorphone Hcl] Other (See Comments)    Unknown  . Hydrocodone Nausea And Vomiting  .  Invokana [Canagliflozin] Itching and Other (See Comments)    Yeast Infections   . Macrodantin Nausea And Vomiting       Review of Systems  Constitutional: Negative for fatigue and unexpected weight change.  Eyes: Negative for visual disturbance.  Respiratory: Negative for cough, chest tightness, shortness of breath and wheezing.   Cardiovascular: Negative for chest pain, palpitations and leg swelling.  Gastrointestinal: Negative for abdominal pain.  Endocrine: Negative for polydipsia and polyuria.  Neurological: Negative for dizziness, seizures,  syncope, weakness, light-headedness and headaches.       Objective:   Physical Exam Constitutional:      Appearance: She is well-developed.  Eyes:     Pupils: Pupils are equal, round, and reactive to light.  Neck:     Musculoskeletal: Neck supple.     Thyroid: No thyromegaly.     Vascular: No JVD.  Cardiovascular:     Rate and Rhythm: Normal rate.     Heart sounds: No gallop.   Pulmonary:     Effort: Pulmonary effort is normal. No respiratory distress.     Breath sounds: Normal breath sounds. No wheezing or rales.  Neurological:     Mental Status: She is alert.        Assessment:     #1 type 2 diabetes.  Improved control with A1c 7.0%  #2 hypothyroidism on replacement  #3 history of atrial fibrillation currently on Eliquis  #4 dyslipidemia treated with Lipitor    Plan:     -Provided samples of Tresiba and continue 18 units once daily -We will need to check TSH at follow-up and we will plan routine follow-up in 6 months -Continue other regular medications. -We reviewed her labs from September which include chemistries and lipid panel these were stable -Also consider CBC at follow-up  Eulas Post MD [facility] at [facility]

## 2018-07-08 ENCOUNTER — Ambulatory Visit: Payer: Self-pay

## 2018-07-08 NOTE — Telephone Encounter (Signed)
FYI

## 2018-07-08 NOTE — Telephone Encounter (Signed)
Agree with advice

## 2018-07-08 NOTE — Telephone Encounter (Signed)
Pt. Reports she has had back pain a while -"I have a slipped disc." C/o pain on right side of back that started getting worse last night. No numbness or weakness. No radiation of pain. Appointment made for next week. Instructed to go to ED if symptoms worsen. Verbalizes understanding.  Reason for Disposition . [1] MODERATE back pain (e.g., interferes with normal activities) AND [2] present > 3 days  Answer Assessment - Initial Assessment Questions 1. ONSET: "When did the pain begin?"      Started last night 2. LOCATION: "Where does it hurt?" (upper, mid or lower back)     Right side of back 3. SEVERITY: "How bad is the pain?"  (e.g., Scale 1-10; mild, moderate, or severe)   - MILD (1-3): doesn't interfere with normal activities    - MODERATE (4-7): interferes with normal activities or awakens from sleep    - SEVERE (8-10): excruciating pain, unable to do any normal activities      3-4 4. PATTERN: "Is the pain constant?" (e.g., yes, no; constant, intermittent)   Constant 5. RADIATION: "Does the pain shoot into your legs or elsewhere?"     Maybe a little 6. CAUSE:  "What do you think is causing the back pain?"      I have a disc 7. BACK OVERUSE:  "Any recent lifting of heavy objects, strenuous work or exercise?"     No 8. MEDICATIONS: "What have you taken so far for the pain?" (e.g., nothing, acetaminophen, NSAIDS)     Aleve 9. NEUROLOGIC SYMPTOMS: "Do you have any weakness, numbness, or problems with bowel/bladder control?"     No 10. OTHER SYMPTOMS: "Do you have any other symptoms?" (e.g., fever, abdominal pain, burning with urination, blood in urine)       No 11. PREGNANCY: "Is there any chance you are pregnant?" (e.g., yes, no; LMP)       No  Protocols used: BACK PAIN-A-AH

## 2018-07-12 ENCOUNTER — Other Ambulatory Visit: Payer: Self-pay | Admitting: Family Medicine

## 2018-07-12 ENCOUNTER — Ambulatory Visit (INDEPENDENT_AMBULATORY_CARE_PROVIDER_SITE_OTHER): Payer: Medicare Other | Admitting: Family Medicine

## 2018-07-12 ENCOUNTER — Other Ambulatory Visit: Payer: Self-pay

## 2018-07-12 ENCOUNTER — Encounter: Payer: Self-pay | Admitting: Family Medicine

## 2018-07-12 VITALS — BP 136/88 | HR 64 | Temp 98.1°F | Ht 61.5 in | Wt 151.3 lb

## 2018-07-12 DIAGNOSIS — R1031 Right lower quadrant pain: Secondary | ICD-10-CM

## 2018-07-12 LAB — POCT URINALYSIS DIPSTICK
Bilirubin, UA: NEGATIVE
Glucose, UA: NEGATIVE
Ketones, UA: NEGATIVE
LEUKOCYTES UA: NEGATIVE
NITRITE UA: POSITIVE
PH UA: 6 (ref 5.0–8.0)
PROTEIN UA: NEGATIVE
RBC UA: NEGATIVE
SPEC GRAV UA: 1.025 (ref 1.010–1.025)
UROBILINOGEN UA: 0.2 U/dL

## 2018-07-12 MED ORDER — METRONIDAZOLE 500 MG PO TABS: 500.0000 mg | ORAL_TABLET | Freq: Three times a day (TID) | ORAL | 0 refills | Status: DC

## 2018-07-12 MED ORDER — CIPROFLOXACIN HCL 500 MG PO TABS: 500.0000 mg | ORAL_TABLET | Freq: Two times a day (BID) | ORAL | 0 refills | Status: DC

## 2018-07-12 NOTE — Telephone Encounter (Signed)
Last OV 06/21/18, Next OV 12/21/2018  Last filled 12/21/17, # 30 with 2 refills  OK to continue this medication? Not continuous on past med list

## 2018-07-12 NOTE — Progress Notes (Signed)
Subjective:     Patient ID: Julie Norton, female   DOB: July 28, 1929, 85 y.o.   MRN: [id number]  HPI Patient is seen with 3-week history of relatively constant right lower quadrant abdominal pain.  She does have some radiation toward the back.  She does have some intermittent chronic low back pain.  She also has history of diverticulitis.  She denies any fever.  Symptoms wax and wane somewhat but her pain has not been completely relieved during the past few weeks.  Denies any burning with urination.  She has some chronic intermittent diarrhea which is unchanged.  She has had previous hysterectomy and ovariectomy.  She has had previous appendectomy.  Still able to eat.  No gross hematuria.  She had colonoscopy just last year which showed diverticulosis changes throughout her entire colon.  Past Medical History:  Diagnosis Date  . Abdominal adhesions   . Abdominal gas pain    suspected -- may be related to intermittent right upper quadrant discomfort radiating around her back      . Breast cancer ([place])   . Breast mass    left breast  . Cancer ([place])    left breast  . Chest pain    intermittent right upper quadrant discomfort radiating around her back      . Complication of anesthesia   . Diabetes mellitus without complication ([place])    Type II  . Dyspepsia   . GERD (gastroesophageal reflux disease)   . H/O: hysterectomy 1978  . Hypercholesteremia   . Hypothyroidism   . Personal history of radiation therapy   . Pneumonia   . PONV (postoperative nausea and vomiting)   . Tendinitis    of the right arm and shoulder  . Torn tendon    right shoulder   Past Surgical History:  Procedure Laterality Date  . ABDOMINAL HYSTERECTOMY    . APPENDECTOMY  1978  . BLADDER REPAIR    . BREAST LUMPECTOMY Left 02/24/2011   central partial mastectomy - dr Margot Chimes  . BREAST REDUCTION SURGERY    . CARDIOVERSION N/A 10/28/2017   Procedure: CARDIOVERSION;  Surgeon: Lelon Perla, MD;  Location: [facility]  ENDOSCOPY;  Service: Cardiovascular;  Laterality: N/A;  . COLONOSCOPY WITH PROPOFOL N/A 03/24/2017   Procedure: COLONOSCOPY WITH PROPOFOL;  Surgeon: Wilford Corner, MD;  Location: [place];  Service: Endoscopy;  Laterality: N/A;  . ESOPHAGOGASTRODUODENOSCOPY (EGD) WITH PROPOFOL N/A 03/24/2017   Procedure: ESOPHAGOGASTRODUODENOSCOPY (EGD) WITH PROPOFOL;  Surgeon: Wilford Corner, MD;  Location: [place];  Service: Endoscopy;  Laterality: N/A;  . LIPOMA EXCISION Right   . REDUCTION MAMMAPLASTY Bilateral 1998  . RETINAL DETACHMENT SURGERY Left   . SALPINGOOPHORECTOMY    . TEE WITHOUT CARDIOVERSION N/A 10/28/2017   Procedure: TRANSESOPHAGEAL ECHOCARDIOGRAM (TEE);  Surgeon: Lelon Perla, MD;  Location: [facility] ENDOSCOPY;  Service: Cardiovascular;  Laterality: N/A;  . [place]   at 85 years of age    reports that she has never smoked. She has never used smokeless tobacco. She reports that she does not drink alcohol or use drugs. family history includes Aortic aneurysm in her mother and sister; Cancer in her mother; Diabetes in her mother; Heart attack in her mother; Hypertension in her mother; Pneumonia in her father; Stroke in her mother. Allergies  Allergen Reactions  . Bactrim Nausea And Vomiting  . Dilaudid [Hydromorphone Hcl] Other (See Comments)    Unknown  . Hydrocodone Nausea And Vomiting  . Invokana [Canagliflozin] Itching and  Other (See Comments)    Yeast Infections   . Macrodantin Nausea And Vomiting     Review of Systems  Constitutional: Negative for chills and fever.  Cardiovascular: Negative for chest pain.  Gastrointestinal: Positive for abdominal pain. Negative for blood in stool, nausea and vomiting.  Genitourinary: Negative for difficulty urinating, dysuria and hematuria.       Objective:   Physical Exam Constitutional:      Appearance: Normal appearance.  Cardiovascular:     Rate and Rhythm: Normal rate and regular rhythm.   Pulmonary:     Effort: Pulmonary effort is normal.     Breath sounds: Normal breath sounds.  Abdominal:     General: Bowel sounds are normal.     Palpations: Abdomen is soft.     Comments: She has some mild tenderness right lower quadrant to deep palpation.  No guarding or rebound.  No masses palpated.  She does not have any right upper quadrant or left-sided tenderness  Neurological:     Mental Status: She is alert.        Assessment:     3-week history of abdominal pain right lower quadrant.  Doubt kidney stone.  Doubt UTI.  Question diverticulitis.  Nonacute abdomen    Plan:     -Check urinalysis and CBC with differential -urine dip shows pos nitrites. -urine cx sent.  -start Cipro 500 mg po bid for 10 days and Flagyl 500 mg po tid -follow up immediately for any fever or worsening pain.  Eulas Post MD [place] [facility] at [facility]

## 2018-07-12 NOTE — Patient Instructions (Signed)
Abdominal Pain, Adult  Abdominal pain can be caused by many things. Often, abdominal pain is not serious and it gets better with no treatment or by being treated at home. However, sometimes abdominal pain is serious. Your health care provider will do a medical history and a physical exam to try to determine the cause of your abdominal pain.  Follow these instructions at home:   Take over-the-counter and prescription medicines only as told by your health care provider. Do not take a laxative unless told by your health care provider.   Drink enough fluid to keep your urine clear or pale yellow.   Watch your condition for any changes.   Keep all follow-up visits as told by your health care provider. This is important.  Contact a health care provider if:   Your abdominal pain changes or gets worse.   You are not hungry or you lose weight without trying.   You are constipated or have diarrhea for more than 2-3 days.   You have pain when you urinate or have a bowel movement.   Your abdominal pain wakes you up at night.   Your pain gets worse with meals, after eating, or with certain foods.   You are throwing up and cannot keep anything down.   You have a fever.  Get help right away if:   Your pain does not go away as soon as your health care provider told you to expect.   You cannot stop throwing up.   Your pain is only in areas of the abdomen, such as the right side or the left lower portion of the abdomen.   You have bloody or black stools, or stools that look like tar.   You have severe pain, cramping, or bloating in your abdomen.   You have signs of dehydration, such as:  ? Dark urine, very little urine, or no urine.  ? Cracked lips.  ? Dry mouth.  ? Sunken eyes.  ? Sleepiness.  ? Weakness.  This information is not intended to replace advice given to you by your health care provider. Make sure you discuss any questions you have with your health care provider.  Document Released: 04/01/2005 Document  Revised: 01/10/2016 Document Reviewed: 12/04/2015  Elsevier Interactive Patient Education  2019 [employer]

## 2018-07-12 NOTE — Telephone Encounter (Signed)
Refill for 6 months. 

## 2018-07-13 LAB — CBC WITH DIFFERENTIAL/PLATELET
BASOS ABS: 0 10*3/uL (ref 0.0–0.1)
Basophils Relative: 0.1 % (ref 0.0–3.0)
EOS ABS: 0.2 10*3/uL (ref 0.0–0.7)
Eosinophils Relative: 3.7 % (ref 0.0–5.0)
HCT: 33.8 % — ABNORMAL LOW (ref 36.0–46.0)
HEMOGLOBIN: 11.2 g/dL — AB (ref 12.0–15.0)
Lymphocytes Relative: 31.6 % (ref 12.0–46.0)
Lymphs Abs: 2.1 10*3/uL (ref 0.7–4.0)
MCHC: 33.2 g/dL (ref 30.0–36.0)
MCV: 75.6 fl — ABNORMAL LOW (ref 78.0–100.0)
MONO ABS: 0.7 10*3/uL (ref 0.1–1.0)
Monocytes Relative: 10 % (ref 3.0–12.0)
Neutro Abs: 3.7 10*3/uL (ref 1.4–7.7)
Neutrophils Relative %: 54.6 % (ref 43.0–77.0)
Platelets: 186 10*3/uL (ref 150.0–400.0)
RBC: 4.47 Mil/uL (ref 3.87–5.11)
RDW: 15.9 % — ABNORMAL HIGH (ref 11.5–15.5)
WBC: 6.7 10*3/uL (ref 4.0–10.5)

## 2018-07-14 LAB — URINE CULTURE
MICRO NUMBER:: 21940
SPECIMEN QUALITY:: ADEQUATE

## 2018-07-16 ENCOUNTER — Encounter: Payer: Self-pay | Admitting: Family Medicine

## 2018-07-19 ENCOUNTER — Encounter: Payer: Self-pay | Admitting: Family Medicine

## 2018-07-19 ENCOUNTER — Ambulatory Visit (INDEPENDENT_AMBULATORY_CARE_PROVIDER_SITE_OTHER): Payer: Medicare Other | Admitting: Family Medicine

## 2018-07-19 ENCOUNTER — Other Ambulatory Visit: Payer: Self-pay

## 2018-07-19 VITALS — BP 108/70 | HR 68 | Temp 97.6°F | Ht 61.5 in | Wt 146.7 lb

## 2018-07-19 DIAGNOSIS — K921 Melena: Secondary | ICD-10-CM | POA: Diagnosis not present

## 2018-07-19 DIAGNOSIS — R195 Other fecal abnormalities: Secondary | ICD-10-CM

## 2018-07-19 DIAGNOSIS — R1013 Epigastric pain: Secondary | ICD-10-CM

## 2018-07-19 NOTE — Patient Instructions (Signed)
Stay well hydrated.  I would hold any further antibiotic doses at this time  Avoid high fat and high sugar foods until diarrhea/loose stools improved  Follow up for any increased abdominal pain, fever, or other concerns.

## 2018-07-19 NOTE — Progress Notes (Signed)
Subjective:     Patient ID: Julie Norton, female   DOB: 1929-10-02, 85 y.o.   MRN: [id number]  HPI She was seen a week ago with some fairly vague lower abdominal pain.  We question acute diverticulitis.  Her CBC came back with stable hemoglobin 11.2 with normal white count.  Urinalysis was sent and cultured and grew out E. coli.  She was treated with Cipro and Flagyl.  Her lower abdominal pain is improved but she had called in yesterday stating over the weekend she developed some black stool on Saturday.  Denied any Pepto-Bismol use.  Also developed some loose stools and more frequent stools.  No vomiting.  Occasional nausea.  No aspirin use.  Does take Eliquis.  Denies any dizziness, weakness, or dyspnea.  She had EGD September 2018 with no evidence for peptic ulcer disease. She is having some mild epigastric pains intermittently.  No recent nonsteroidal use.  Past Medical History:  Diagnosis Date  . Abdominal adhesions   . Abdominal gas pain    suspected -- may be related to intermittent right upper quadrant discomfort radiating around her back      . Breast cancer ([place])   . Breast mass    left breast  . Cancer ([place])    left breast  . Chest pain    intermittent right upper quadrant discomfort radiating around her back      . Complication of anesthesia   . Diabetes mellitus without complication ([place])    Type II  . Dyspepsia   . GERD (gastroesophageal reflux disease)   . H/O: hysterectomy 1978  . Hypercholesteremia   . Hypothyroidism   . Personal history of radiation therapy   . Pneumonia   . PONV (postoperative nausea and vomiting)   . Tendinitis    of the right arm and shoulder  . Torn tendon    right shoulder   Past Surgical History:  Procedure Laterality Date  . ABDOMINAL HYSTERECTOMY    . APPENDECTOMY  1978  . BLADDER REPAIR    . BREAST LUMPECTOMY Left 02/24/2011   central partial mastectomy - dr Margot Chimes  . BREAST REDUCTION SURGERY    . CARDIOVERSION N/A  10/28/2017   Procedure: CARDIOVERSION;  Surgeon: Lelon Perla, MD;  Location: [facility] ENDOSCOPY;  Service: Cardiovascular;  Laterality: N/A;  . COLONOSCOPY WITH PROPOFOL N/A 03/24/2017   Procedure: COLONOSCOPY WITH PROPOFOL;  Surgeon: Wilford Corner, MD;  Location: [place];  Service: Endoscopy;  Laterality: N/A;  . ESOPHAGOGASTRODUODENOSCOPY (EGD) WITH PROPOFOL N/A 03/24/2017   Procedure: ESOPHAGOGASTRODUODENOSCOPY (EGD) WITH PROPOFOL;  Surgeon: Wilford Corner, MD;  Location: [place];  Service: Endoscopy;  Laterality: N/A;  . LIPOMA EXCISION Right   . REDUCTION MAMMAPLASTY Bilateral 1998  . RETINAL DETACHMENT SURGERY Left   . SALPINGOOPHORECTOMY    . TEE WITHOUT CARDIOVERSION N/A 10/28/2017   Procedure: TRANSESOPHAGEAL ECHOCARDIOGRAM (TEE);  Surgeon: Lelon Perla, MD;  Location: [facility] ENDOSCOPY;  Service: Cardiovascular;  Laterality: N/A;  . [place]   at 85 years of age    reports that she has never smoked. She has never used smokeless tobacco. She reports that she does not drink alcohol or use drugs. family history includes Aortic aneurysm in her mother and sister; Cancer in her mother; Diabetes in her mother; Heart attack in her mother; Hypertension in her mother; Pneumonia in her father; Stroke in her mother. Allergies  Allergen Reactions  . Bactrim Nausea And Vomiting  . Dilaudid [Hydromorphone Hcl] Other (  See Comments)    Unknown  . Hydrocodone Nausea And Vomiting  . Invokana [Canagliflozin] Itching and Other (See Comments)    Yeast Infections   . Macrodantin Nausea And Vomiting     Review of Systems  Constitutional: Negative for appetite change, chills and fever.  Respiratory: Negative for shortness of breath.   Cardiovascular: Negative for chest pain.  Gastrointestinal: Positive for abdominal pain, diarrhea and nausea. Negative for abdominal distention, blood in stool and vomiting.  Genitourinary: Negative for dysuria.   Neurological: Negative for dizziness and syncope.       Objective:   Physical Exam Constitutional:      Appearance: Normal appearance.  Cardiovascular:     Rate and Rhythm: Normal rate and regular rhythm.  Pulmonary:     Effort: Pulmonary effort is normal.     Breath sounds: Normal breath sounds.  Abdominal:     Palpations: Abdomen is soft.     Comments: Patient has only minimal tenderness epigastric area and left lower quadrant.  No guarding or rebound.  Genitourinary:    Comments: Digital exam reveals no rectal masses.  She has some very dark brown stool in rectal vault which came back Hemoccult negative Neurological:     Mental Status: She is alert.        Assessment:     #1 patient complaining of "black" stools.  Hemoccult negative on exam today.  No Pepto-Bismol use.  No iron use.  #2 recent lower abdominal pain.  Urine culture grew out E. coli.  There is clinical question of acute diverticulitis.  That pain is improved  #3 frequent loose stools.  She has had history of somewhat chronic frequent loose stools in the past    Plan:     -Recheck CBC-if hemoglobin dropping refer back to GI.  She does not have clinical evidence for acute bleeding at this time -Continue Eliquis for now -Discontinue metronidazole and Cipro -Follow-up immediately for any dizziness, progressive abdominal pain, or other concerns  Eulas Post MD [facility] at [facility]  -

## 2018-07-20 ENCOUNTER — Other Ambulatory Visit: Payer: Self-pay | Admitting: Family Medicine

## 2018-07-20 LAB — CBC WITH DIFFERENTIAL/PLATELET
BASOS ABS: 0.1 10*3/uL (ref 0.0–0.1)
BASOS PCT: 1.2 % (ref 0.0–3.0)
EOS ABS: 0.2 10*3/uL (ref 0.0–0.7)
Eosinophils Relative: 2.7 % (ref 0.0–5.0)
HEMATOCRIT: 33.3 % — AB (ref 36.0–46.0)
Hemoglobin: 11.1 g/dL — ABNORMAL LOW (ref 12.0–15.0)
Lymphocytes Relative: 26.3 % (ref 12.0–46.0)
Lymphs Abs: 1.5 10*3/uL (ref 0.7–4.0)
MCHC: 33.2 g/dL (ref 30.0–36.0)
MCV: 75.7 fl — AB (ref 78.0–100.0)
MONO ABS: 0.7 10*3/uL (ref 0.1–1.0)
Monocytes Relative: 11.2 % (ref 3.0–12.0)
Neutro Abs: 3.4 10*3/uL (ref 1.4–7.7)
Neutrophils Relative %: 58.6 % (ref 43.0–77.0)
PLATELETS: 184 10*3/uL (ref 150.0–400.0)
RBC: 4.4 Mil/uL (ref 3.87–5.11)
RDW: 15.8 % — ABNORMAL HIGH (ref 11.5–15.5)
WBC: 5.9 10*3/uL (ref 4.0–10.5)

## 2018-07-20 NOTE — Telephone Encounter (Signed)
Omeprazole is not on current med list. It looks like she has been taking Pepcid.  Is this correct?

## 2018-07-21 NOTE — Telephone Encounter (Signed)
Refill Metformin for one year.  She should be taking the Pepcid.  Not sure why Omeprazole requested.

## 2018-07-28 DIAGNOSIS — E119 Type 2 diabetes mellitus without complications: Secondary | ICD-10-CM | POA: Diagnosis not present

## 2018-07-28 DIAGNOSIS — Z961 Presence of intraocular lens: Secondary | ICD-10-CM | POA: Diagnosis not present

## 2018-07-29 LAB — HM DIABETES EYE EXAM

## 2018-08-05 ENCOUNTER — Other Ambulatory Visit: Payer: Self-pay | Admitting: Family Medicine

## 2018-08-09 ENCOUNTER — Other Ambulatory Visit: Payer: Self-pay

## 2018-08-09 MED ORDER — ATORVASTATIN CALCIUM 10 MG PO TABS: ORAL_TABLET | ORAL | 1 refills | Status: DC

## 2018-08-23 DIAGNOSIS — D229 Melanocytic nevi, unspecified: Secondary | ICD-10-CM | POA: Diagnosis not present

## 2018-08-23 DIAGNOSIS — D485 Neoplasm of uncertain behavior of skin: Secondary | ICD-10-CM | POA: Diagnosis not present

## 2018-08-23 DIAGNOSIS — L853 Xerosis cutis: Secondary | ICD-10-CM | POA: Diagnosis not present

## 2018-08-23 DIAGNOSIS — L821 Other seborrheic keratosis: Secondary | ICD-10-CM | POA: Diagnosis not present

## 2018-08-23 DIAGNOSIS — L819 Disorder of pigmentation, unspecified: Secondary | ICD-10-CM | POA: Diagnosis not present

## 2018-08-24 ENCOUNTER — Ambulatory Visit (INDEPENDENT_AMBULATORY_CARE_PROVIDER_SITE_OTHER): Payer: Medicare Other | Admitting: Family Medicine

## 2018-08-24 ENCOUNTER — Other Ambulatory Visit: Payer: Self-pay

## 2018-08-24 ENCOUNTER — Encounter: Payer: Self-pay | Admitting: Family Medicine

## 2018-08-24 VITALS — BP 110/70 | HR 70 | Temp 98.0°F | Ht 61.5 in | Wt 149.9 lb

## 2018-08-24 DIAGNOSIS — R197 Diarrhea, unspecified: Secondary | ICD-10-CM

## 2018-08-24 DIAGNOSIS — E1165 Type 2 diabetes mellitus with hyperglycemia: Secondary | ICD-10-CM | POA: Diagnosis not present

## 2018-08-24 DIAGNOSIS — M542 Cervicalgia: Secondary | ICD-10-CM | POA: Diagnosis not present

## 2018-08-24 NOTE — Patient Instructions (Signed)
Consider dropping the Metformin back to one daily  Consider topical heat to neck and muscle massage with topical sports cream such as Biofreeze or [employer].    Consider probiotic such as Activia (yogurt) or Align.

## 2018-08-24 NOTE — Progress Notes (Signed)
Subjective:     Patient ID: Julie Norton, female   DOB: Feb 04, 1930, 85 y.o.   MRN: [id number]  HPI Patient is seen with complaints of some pain mostly in her left occipital area.  This radiates somewhat down to the neck.  No radiculitis symptoms.  No numbness or weakness.  She initially thought she may have some type of ear infection.  She has not had any hearing changes.  No ear drainage.  No fevers or chills.  No vertigo.  She has type 2 diabetes.  She has chronic history of intermittent loose stools and frequent abdominal cramps.  May have some IBS issues.  She is currently on extended release metformin and does better with extended release than immediate release but still has frequent loose stools.  A1c finally improving to 7.0 at last check  Past Medical History:  Diagnosis Date  . Abdominal adhesions   . Abdominal gas pain    suspected -- may be related to intermittent right upper quadrant discomfort radiating around her back      . Breast cancer ([place])   . Breast mass    left breast  . Cancer ([place])    left breast  . Chest pain    intermittent right upper quadrant discomfort radiating around her back      . Complication of anesthesia   . Diabetes mellitus without complication ([place])    Type II  . Dyspepsia   . GERD (gastroesophageal reflux disease)   . H/O: hysterectomy 1978  . Hypercholesteremia   . Hypothyroidism   . Personal history of radiation therapy   . Pneumonia   . PONV (postoperative nausea and vomiting)   . Tendinitis    of the right arm and shoulder  . Torn tendon    right shoulder   Past Surgical History:  Procedure Laterality Date  . ABDOMINAL HYSTERECTOMY    . APPENDECTOMY  1978  . BLADDER REPAIR    . BREAST LUMPECTOMY Left 02/24/2011   central partial mastectomy - dr Margot Chimes  . BREAST REDUCTION SURGERY    . CARDIOVERSION N/A 10/28/2017   Procedure: CARDIOVERSION;  Surgeon: Lelon Perla, MD;  Location: [facility] ENDOSCOPY;  Service: Cardiovascular;   Laterality: N/A;  . COLONOSCOPY WITH PROPOFOL N/A 03/24/2017   Procedure: COLONOSCOPY WITH PROPOFOL;  Surgeon: Wilford Corner, MD;  Location: [place];  Service: Endoscopy;  Laterality: N/A;  . ESOPHAGOGASTRODUODENOSCOPY (EGD) WITH PROPOFOL N/A 03/24/2017   Procedure: ESOPHAGOGASTRODUODENOSCOPY (EGD) WITH PROPOFOL;  Surgeon: Wilford Corner, MD;  Location: [place];  Service: Endoscopy;  Laterality: N/A;  . LIPOMA EXCISION Right   . REDUCTION MAMMAPLASTY Bilateral 1998  . RETINAL DETACHMENT SURGERY Left   . SALPINGOOPHORECTOMY    . TEE WITHOUT CARDIOVERSION N/A 10/28/2017   Procedure: TRANSESOPHAGEAL ECHOCARDIOGRAM (TEE);  Surgeon: Lelon Perla, MD;  Location[facility] ENDOSCOPY;  Service: Cardiovascular;  Laterality: N/A;  . [place]   at 85 years of age    reports that she has never smoked. She has never used smokeless tobacco. She reports that she does not drink alcohol or use drugs. family history includes Aortic aneurysm in her mother and sister; Cancer in her mother; Diabetes in her mother; Heart attack in her mother; Hypertension in her mother; Pneumonia in her father; Stroke in her mother. Allergies  Allergen Reactions  . Bactrim Nausea And Vomiting  . Dilaudid [Hydromorphone Hcl] Other (See Comments)    Unknown  . Hydrocodone Nausea And Vomiting  . Invokana [Canagliflozin]  Itching and Other (See Comments)    Yeast Infections   . Macrodantin Nausea And Vomiting     Review of Systems  Constitutional: Negative for chills and fever.  HENT: Positive for ear pain. Negative for hearing loss, sinus pressure, sinus pain and sore throat.   Respiratory: Negative for cough and shortness of breath.   Cardiovascular: Negative for chest pain, palpitations and leg swelling.  Gastrointestinal: Positive for diarrhea. Negative for abdominal pain and blood in stool.       Objective:   Physical Exam Constitutional:      Appearance: Normal appearance.   HENT:     Right Ear: Tympanic membrane, ear canal and external ear normal. There is no impacted cerumen.     Left Ear: Tympanic membrane, ear canal and external ear normal. There is no impacted cerumen.     Mouth/Throat:     Pharynx: Oropharynx is clear.  Neck:     Musculoskeletal: Neck supple.  Cardiovascular:     Rate and Rhythm: Normal rate and regular rhythm.  Pulmonary:     Effort: Pulmonary effort is normal.     Breath sounds: Normal breath sounds.  Musculoskeletal:     Comments: She has good range of motion but does have some left-sided neck pain when rotating to the right side.  She does have some paracervical muscular tenderness on the left side.  No visible swelling.  No ecchymosis.  No erythema.  Lymphadenopathy:     Cervical: No cervical adenopathy.  Neurological:     Mental Status: She is alert.        Assessment:     #1 left-sided neck pain.  Suspect musculoskeletal.  No evidence for otitis media  #2 frequent loose stools.  Possibly exacerbated by metformin  #3 type 2 diabetes with recent A1c 7.0%    Plan:     -Try reducing her metformin to once daily -Avoid muscle relaxers or nonsteroidals with her age and also on Eliquis therapy -Recommend conservative measures with topical heat, muscle massage with topical sports cream and consider physical therapy if not improving over the next week  Eulas Post MD [facility] at [facility]

## 2018-08-29 IMAGING — MG 2D DIGITAL DIAGNOSTIC BILATERAL MAMMOGRAM WITH CAD AND ADJUNCT T
8 of 13 series · 8 of 29 positions shown · non-contrast
Comparison: Previous exam(s).

CLINICAL DATA: 86-year-old female with history of left lumpectomy
in 0980. The patient also has a history of bilateral reduction
mammoplasty in 7229.

EXAM:
2D DIGITAL DIAGNOSTIC BILATERAL MAMMOGRAM WITH CAD AND ADJUNCT TOMO
ULTRASOUND RIGHT BREAST

[L MLO (1 of 2)]
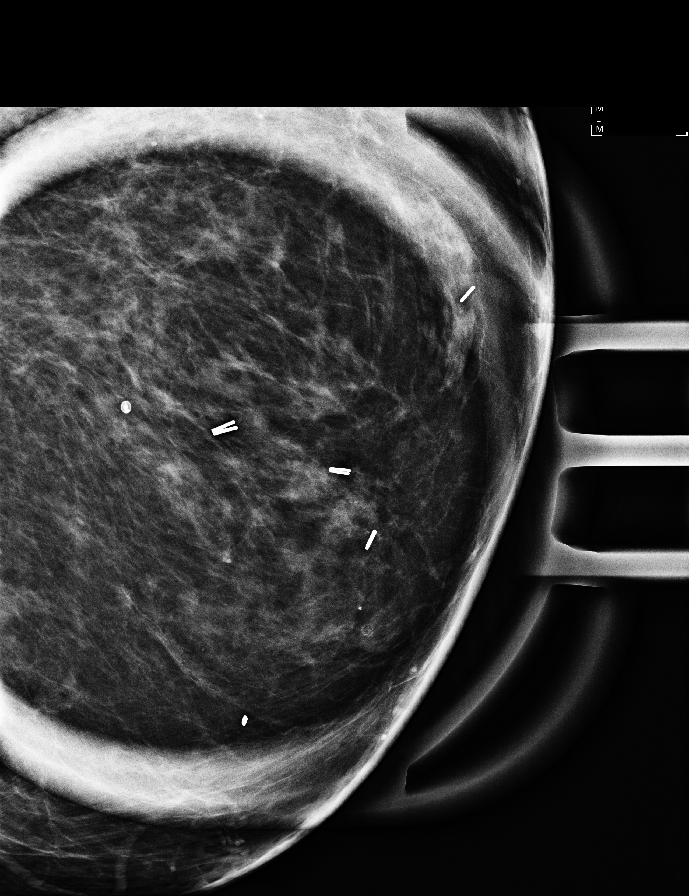

[L MLO (2 of 2)]
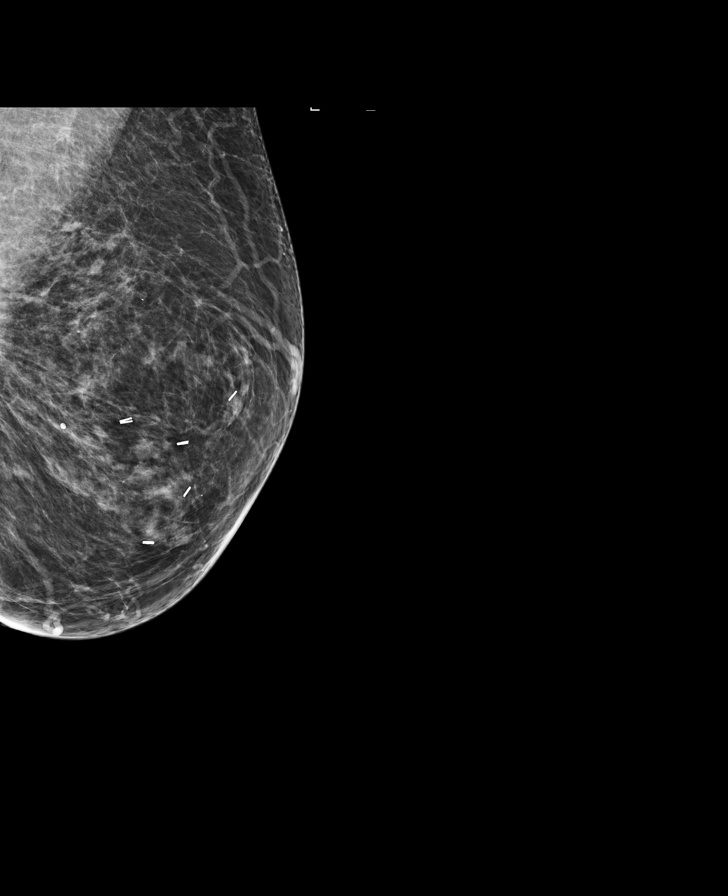

[R CC synth-2D]
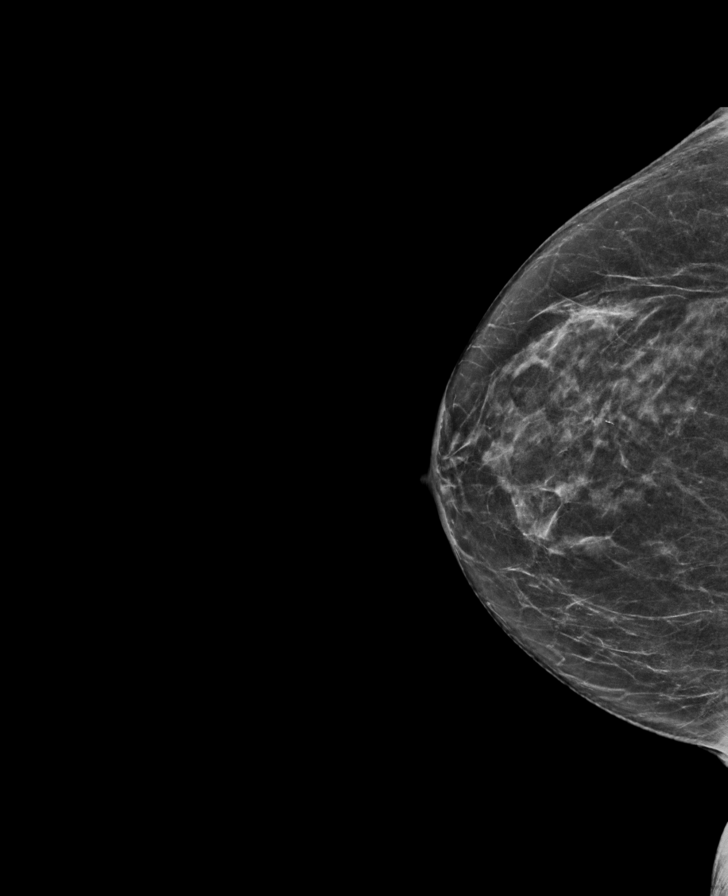

[L CC]
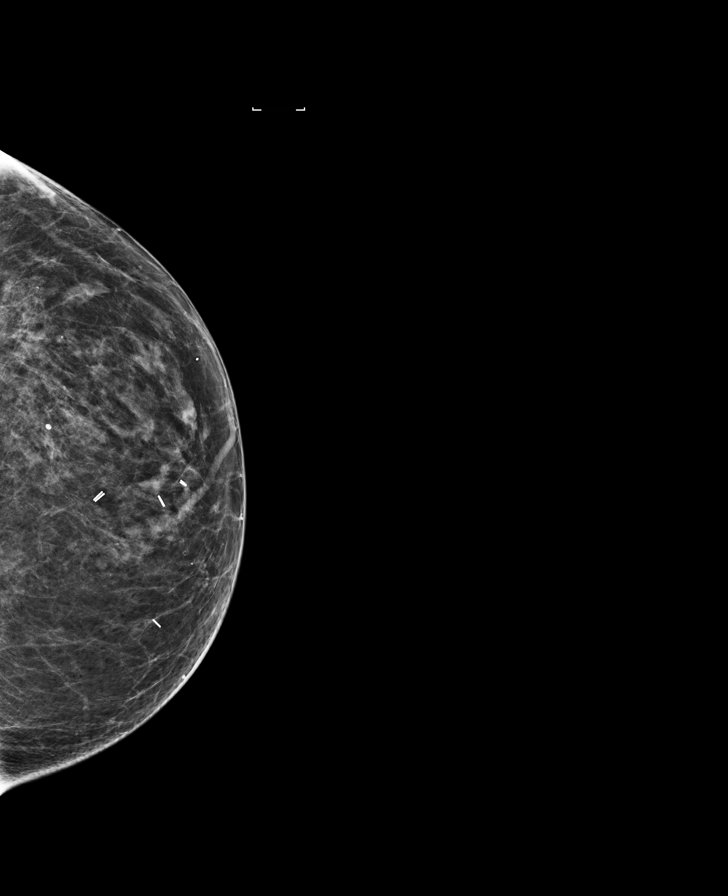

[L MLO synth-2D]
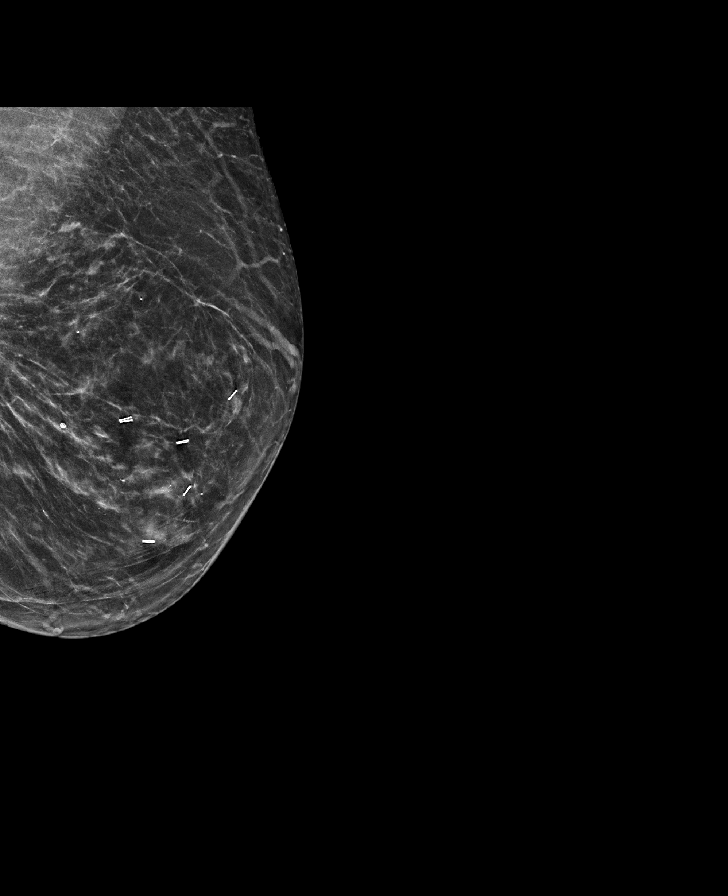

[L CC synth-2D]
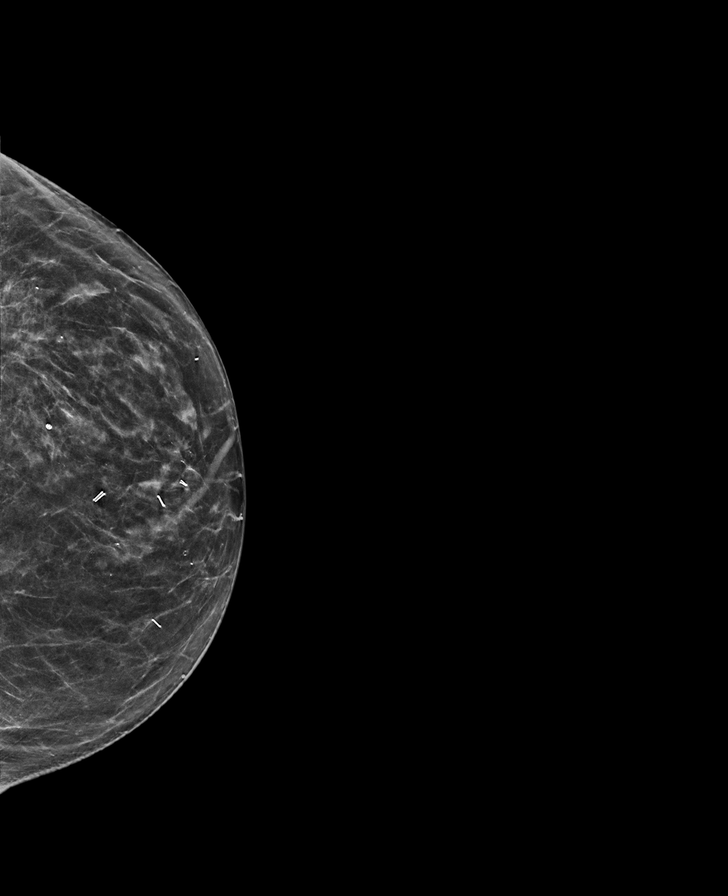

[R MLO]
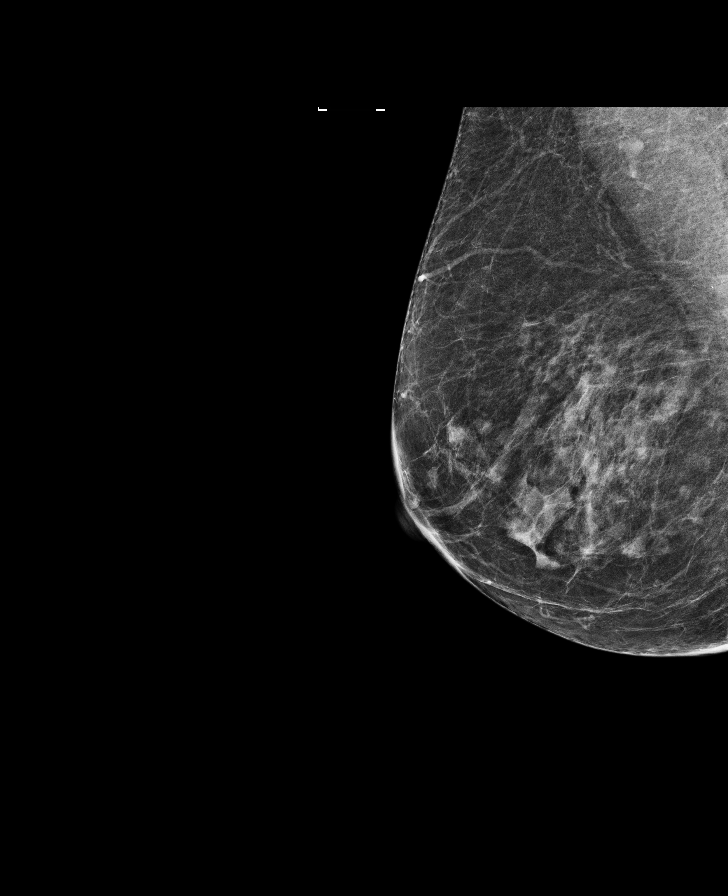

[R MLO synth-2D]
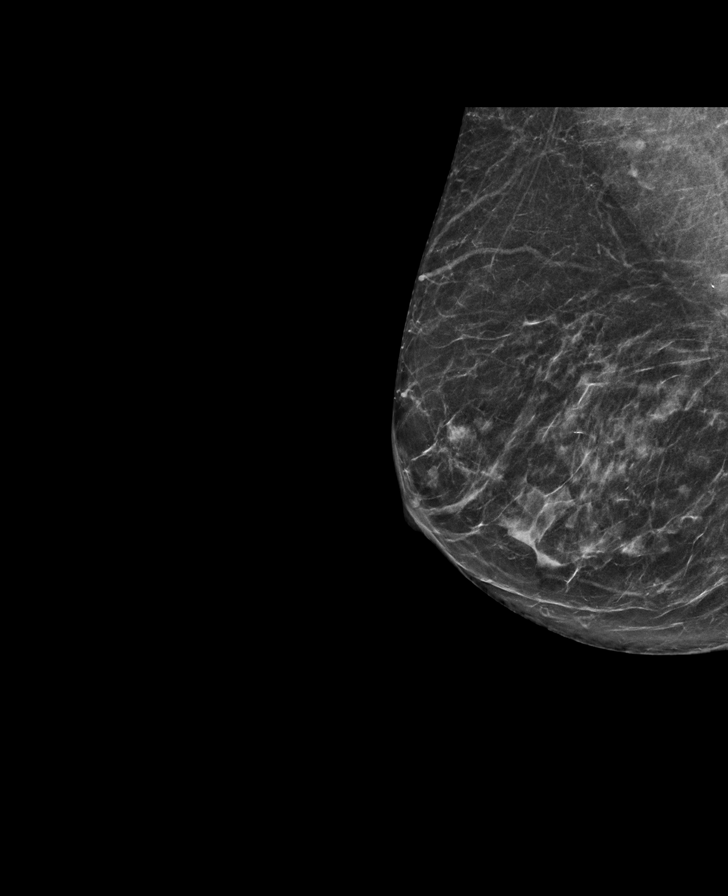

[8 of 29 positions shown; findings below may reference images not displayed]

ACR Breast Density Category c: The breast tissue is heterogeneously
dense, which may obscure small masses.
FINDINGS: There is a 6 mm oval, circumscribed mass within the upper, slightly
inner right breast, anterior to middle depth. Posttreatment changes
are seen of the left breast. No suspicious mass, calcifications, or
other abnormality is identified within the left breast.

Mammographic images were processed with CAD.

Targeted ultrasound of the superior right breast demonstrates an
oval, hypoechoic mass at [DATE], 1 cm from the nipple with indistinct
margins measuring 5 x 3 x 4 mm. Ultrasound of the right axilla
demonstrates no suspicious appearing axillary lymph nodes.
IMPRESSION: Indeterminate right breast mass.

RECOMMENDATION:
Ultrasound-guided aspiration of the right breast mass at [DATE], 1 cm
from the nipple. If unable to aspirate,ultrasound-guided core biopsy
is recommended.

I have discussed the findings and recommendations with the patient
and her husband. Results were also provided in writing at the
conclusion of the visit. If applicable, a reminder letter will be
sent to the patient regarding the next appointment.

BI-RADS CATEGORY  4: Suspicious.

## 2018-09-01 ENCOUNTER — Other Ambulatory Visit: Payer: Self-pay | Admitting: Family Medicine

## 2018-09-12 ENCOUNTER — Encounter: Payer: Self-pay | Admitting: Internal Medicine

## 2018-09-12 ENCOUNTER — Ambulatory Visit (INDEPENDENT_AMBULATORY_CARE_PROVIDER_SITE_OTHER): Payer: Medicare Other

## 2018-09-12 ENCOUNTER — Ambulatory Visit (INDEPENDENT_AMBULATORY_CARE_PROVIDER_SITE_OTHER): Payer: Medicare Other | Admitting: Internal Medicine

## 2018-09-12 ENCOUNTER — Ambulatory Visit: Payer: Self-pay | Admitting: *Deleted

## 2018-09-12 VITALS — BP 120/62 | HR 92 | Temp 98.6°F | Wt 148.9 lb

## 2018-09-12 DIAGNOSIS — R509 Fever, unspecified: Secondary | ICD-10-CM

## 2018-09-12 DIAGNOSIS — J988 Other specified respiratory disorders: Secondary | ICD-10-CM

## 2018-09-12 DIAGNOSIS — R05 Cough: Secondary | ICD-10-CM

## 2018-09-12 DIAGNOSIS — R062 Wheezing: Secondary | ICD-10-CM

## 2018-09-12 DIAGNOSIS — R059 Cough, unspecified: Secondary | ICD-10-CM

## 2018-09-12 DIAGNOSIS — J989 Respiratory disorder, unspecified: Secondary | ICD-10-CM | POA: Diagnosis not present

## 2018-09-12 LAB — POCT INFLUENZA A/B
Influenza A, POC: NEGATIVE
Influenza B, POC: NEGATIVE

## 2018-09-12 MED ORDER — IPRATROPIUM-ALBUTEROL 0.5-2.5 (3) MG/3ML IN SOLN
3.0000 mL | Freq: Once | RESPIRATORY_TRACT | Status: AC
  Administered 2018-09-12: 3 mL via RESPIRATORY_TRACT

## 2018-09-12 MED ORDER — ALBUTEROL SULFATE 108 (90 BASE) MCG/ACT IN AEPB: 2.0000 | INHALATION_SPRAY | Freq: Four times a day (QID) | RESPIRATORY_TRACT | 1 refills | Status: DC | PRN

## 2018-09-12 NOTE — Patient Instructions (Addendum)
You have wheezing triggered by acute respiratory infection .    Chest x ray  No pneumonia    This is most likely a viral illness  triggering the wheezing   Try  Inhaler  Medicine for the wheezing   Cough can last another week but fever should be gone in another day  If   Mucous becomes more  infected looking for more than a day   We can add an antibiotic  But today can hold off .

## 2018-09-12 NOTE — Telephone Encounter (Signed)
Noted  

## 2018-09-12 NOTE — Progress Notes (Signed)
Chief Complaint  Patient presents with  . Cough    x3days. fever yesterday, bodyaches, sore from cough, productive cough with "yellowish , greenish" mucus     HPI: Julie Norton 85 y.o. come in for [facility] PCP appt NA  Sent in by nurse triage  Here with husband of 60+ years.   Onset   Cough a little  And scrathcy throat and then     Evening 7th and  8th felt bad  Temp 100.6  Yesterday  Not since then .   Rattle when lays down  Hurts  so much to cough   No hx asthma wheezing   Mucous is usually clear   No blood  Husband  2  Weeks ago . Had cough but no fever and is better   Hx  bronchitis  in  Remote past .  No travel.  dodnt go to church this weekend   Below is nurse triage    09/12/18 12:11 PM  Note    Pt called with complaints of chest congestion, cough, temp 100.6 on 09/11/2018; she says that her chest is rattling; her symptoms on 09/09/2018; she denies shortness of breath; her cough is productive and she has clear secretions; she complains of chest soreness due to coughing; cough drops has not helped;  Recommendations made per nurse triage protocol; the pt would like to be seen today; she normally sees Dr Elease Hashimoto but has no availability; pt offered and accepted appointment with Dr Regis Bill, [facility], 09/12/2018 at 1500; she verbalized understanding; will route to office for notification.        ROS: See pertinent positives and negatives per HPI.  Past Medical History:  Diagnosis Date  . Abdominal adhesions   . Abdominal gas pain    suspected -- may be related to intermittent right upper quadrant discomfort radiating around her back      . Breast cancer ([place])   . Breast mass    left breast  . Cancer ([place])    left breast  . Chest pain    intermittent right upper quadrant discomfort radiating around her back      . Complication of anesthesia   . Diabetes mellitus without complication ([place])    Type II  . Dyspepsia   . GERD (gastroesophageal reflux disease)   . H/O:  hysterectomy 1978  . Hypercholesteremia   . Hypothyroidism   . Personal history of radiation therapy   . Pneumonia   . PONV (postoperative nausea and vomiting)   . Tendinitis    of the right arm and shoulder  . Torn tendon    right shoulder    Family History  Problem Relation Age of Onset  . Pneumonia Father   . Heart attack Mother   . Hypertension Mother   . Stroke Mother   . Diabetes Mother   . Aortic aneurysm Mother        abdominal aortic aneurysm  . Cancer Mother        bladder  . Aortic aneurysm Sister        abdominal aortic aneurysm    Social History   Socioeconomic History  . Marital status: Married    Spouse name: Not on file  . Number of children: Not on file  . Years of education: Not on file  . Highest education level: Not on file  Occupational History  . Not on file  Social Needs  . Financial resource strain: Not on file  . Food  insecurity:    Worry: Not on file    Inability: Not on file  . Transportation needs:    Medical: Not on file    Non-medical: Not on file  Tobacco Use  . Smoking status: Never Smoker  . Smokeless tobacco: Never Used  Substance and Sexual Activity  . Alcohol use: No  . Drug use: No  . Sexual activity: Not on file  Lifestyle  . Physical activity:    Days per week: Not on file    Minutes per session: Not on file  . Stress: Not on file  Relationships  . Social connections:    Talks on phone: Not on file    Gets together: Not on file    Attends religious service: Not on file    Active member of club or organization: Not on file    Attends meetings of clubs or organizations: Not on file    Relationship status: Not on file  Other Topics Concern  . Not on file  Social History Narrative  . Not on file    Outpatient Medications Prior to Visit  Medication Sig Dispense Refill  . atorvastatin (LIPITOR) 10 MG tablet TAKE ONE-HALF TABLET BY  MOUTH EVERY OTHER DAY OR AS DIRECTED 45 tablet 1  . Calcium Carbonate-Vitamin D  (CALCIUM 600+D) 600-200 MG-UNIT TABS Take 1 tablet by mouth 3 (three) times a week.     . Cholecalciferol (VITAMIN D PO) Take 1 tablet by mouth daily.     Marland Kitchen ELIQUIS 5 MG TABS tablet TAKE 1 TABLET BY MOUTH TWICE A DAY 60 tablet 5  . famotidine (PEPCID) 20 MG tablet Take 20 mg by mouth 2 (two) times daily.    Marland Kitchen glucosamine-chondroitin 500-400 MG tablet Take 1 tablet by mouth 3 (three) times a week.     Marland Kitchen glucose blood (ONE TOUCH ULTRA TEST) test strip Check once daily. E11.40 100 each 11  . Insulin Pen Needle 29G X 5MM MISC Use once daily 100 each 3  . levothyroxine (SYNTHROID, LEVOTHROID) 50 MCG tablet TAKE 1 TABLET BY MOUTH  DAILY 90 tablet 1  . metFORMIN (GLUCOPHAGE-XR) 500 MG 24 hr tablet TAKE 1 TABLET BY MOUTH  TWICE A DAY 180 tablet 1  . metroNIDAZOLE (FLAGYL) 500 MG tablet Take 1 tablet (500 mg total) by mouth 3 (three) times daily. 30 tablet 0  . ONE TOUCH ULTRA TEST test strip CHECK ONCE A DAY 50 each 3  . traZODone (DESYREL) 50 MG tablet TAKE 1/2 TO 1 TABLET BY MOUTH AT BEDTIME AS NEEDED FOR SLEEP 30 tablet 5  . TRESIBA FLEXTOUCH 100 UNIT/ML SOPN FlexTouch Pen INJECT 0.24MLS (24 UNITS TOTAL) INTO THE SKIN DAILY AT 10PM 15 pen 1  . triamcinolone cream (KENALOG) 0.1 % Apply 1 application topically 2 (two) times daily as needed. (Patient taking differently: Apply 1 application topically 2 (two) times daily as needed (for itching). ) 30 g 2  . furosemide (LASIX) 20 MG tablet Take 1 tablet (20 mg total) by mouth daily. (Patient taking differently: Take 20 mg by mouth as needed. ) 30 tablet 6  . omeprazole (PRILOSEC) 20 MG capsule      Facility-Administered Medications Prior to Visit  Medication Dose Route Frequency Provider Last Rate Last Dose  . aspirin chewable tablet 81 mg  81 mg Oral Once [place], Peter M, MD         EXAM:  BP 120/62 (BP Location: Right Arm, Patient Position: Sitting, Cuff Size: Normal)   Pulse 92  Temp 98.6 F (37 C) (Oral)   Wt 148 lb 14.4 oz (67.5 kg)   BMI  27.68 kg/m   Body mass index is 27.68 kg/m. WDWN in NAD  quiet respirations; mildly congested  somewhat hoarse. Non toxic . HEENT: Normocephalic ;atraumatic , Eyes;  PERRL, EOMs  Full, lids and conjunctiva clear,,Ears: no deformities, canals nl, TM landmarks normal, Nose: no deformity or discharge but congested;face non  tender Mouth : OP clear without lesion or edema . Neck: Supple without adenopathy or masses or bruits Chest:  Bilateral  Exp wheezing with reasonable air movement   No rales ?   CV:  S1-S2 no gallops or murmurs peripheral perfusion is normal Skin :nl perfusion and no acute rashes  Ambulatory .   After  duoneg  Still some exp wheeze but much better and  Good  Air movements   BP Readings from Last 3 Encounters:  09/12/18 120/62  08/24/18 110/70  07/19/18 108/70  flu screen negative  c x ray NAD  ASSESSMENT AND PLAN:  Discussed the following assessment and plan:  Febrile respiratory illness - Plan: DG Chest 2 View  Cough - Plan: POC Influenza A/B, DG Chest 2 View  Wheezing - Plan: DG Chest 2 View, ipratropium-albuterol (DUONEB) 0.5-2.5 (3) MG/3ML nebulizer solution 3 mL  Wheezing-associated respiratory infection (WARI)  -Patient advised to return or notify health care team  if  new concerns arise.  Patient Instructions  You have wheezing triggered by acute respiratory infection .    Chest x ray  No pneumonia    This is most likely a viral illness  triggering the wheezing   Try  Inhaler  Medicine for the wheezing   Cough can last another week but fever should be gone in another day  If   Mucous becomes more  infected looking for more than a day   We can add an antibiotic  But today can hold off .    Standley Brooking. Kailen Name M.D.

## 2018-09-12 NOTE — Telephone Encounter (Signed)
Pt called with complaints of chest congestion, cough, temp 100.6 on 09/11/2018; she says that her chest is rattling; her symptoms on 09/09/2018; she denies shortness of breath; her cough is productive and she has clear secretions; she complains of chest soreness due to coughing; cough drops has not helped;  Recommendations made per nurse triage protocol; the pt would like to be seen today; she normally sees Dr Elease Hashimoto but has no availability; pt offered and accepted appointment with Dr Regis Bill, [facility], 09/12/2018 at 1500; she verbalized understanding; will route to office for notification.   Reason for Disposition . [1] Continuous (nonstop) coughing interferes with work or school AND [2] no improvement using cough treatment per Care Advice  Answer Assessment - Initial Assessment Questions 1. ONSET: "When did the cough begin?"      09/09/2018 mild cough 2. SEVERITY: "How bad is the cough today?"    Moderate to severe 3. RESPIRATORY DISTRESS: "Describe your breathing."      Breathing ok; no shortness of breath 4. FEVER: "Do you have a fever?" If so, ask: "What is your temperature, how was it measured, and when did it start?"     100.6 on 09/11/2018 5. SPUTUM: "Describe the color of your sputum" (clear, white, yellow, green)     clear 6. HEMOPTYSIS: "Are you coughing up any blood?" If so ask: "How much?" (flecks, streaks, tablespoons, etc.)     no 7. CARDIAC HISTORY: "Do you have any history of heart disease?" (e.g., heart attack, congestive heart failure)      afib 8. LUNG HISTORY: "Do you have any history of lung disease?"  (e.g., pulmonary embolus, asthma, emphysema)     Hx bronchitis 9. PE RISK FACTORS: "Do you have a history of blood clots?" (or: recent major surgery, recent prolonged travel, bedridden)     eliquis for afib 10. OTHER SYMPTOMS: "Do you have any other symptoms?" (e.g., runny nose, wheezing, chest pain)       Chest congestion , runny nose  11. PREGNANCY: "Is there any chance  you are pregnant?" "When was your last menstrual period?"       no 12. TRAVEL: "Have you traveled out of the country in the last month?" (e.g., travel history, exposures)       No no  Protocols used: COUGH - ACUTE PRODUCTIVE-A-AH

## 2018-09-14 ENCOUNTER — Other Ambulatory Visit: Payer: Self-pay

## 2018-09-14 ENCOUNTER — Ambulatory Visit: Payer: Self-pay | Admitting: *Deleted

## 2018-09-14 MED ORDER — AZITHROMYCIN 250 MG PO TABS: ORAL_TABLET | ORAL | 0 refills | Status: DC

## 2018-09-14 NOTE — Telephone Encounter (Addendum)
Message from [place] sent at 09/14/2018 10:40 AM EDT   Summary: continued symptoms after OV this week- congestion    Patient calling with continued symptoms of congestion and feeling overall worse than she did when she was seen in [place] on 3/9. Patient inquired if she may need antibiotic. Please advise.           Called patient regarding her message above. She is wanting to know if she needs an antibiotic. She was seen in the office on 03/09. She had a chest xray, test for flu and prescribed albuterol inhaler. Pt stated she felt better yesterday but worst today. Feeling worst because she thinks it is congestion in her lungs. Wheezing is better.  She is coughing up somewhat yellowish sputum.  She denies fever.  Explain to patient that a cough could go on for a week sometimes more. Advised to increase her fluids, hot tea, lemon and honey and take mucinex for the congestion. Pt voiced understanding. Will route to flow at [facility] at [facility] for recommendations regarding antibiotic. Told patient to call back if she feels like the home care is not helping, she voiced understanding.

## 2018-09-14 NOTE — Telephone Encounter (Signed)
Called patient and let her know that I have sent in a Z pack for her and gave her the message from Dr. Elease Hashimoto. Patient verbalized an understanding.

## 2018-09-14 NOTE — Telephone Encounter (Signed)
Please advise 

## 2018-09-14 NOTE — Telephone Encounter (Signed)
Lets go ahead and start Zithromax for 5 days.  Needs follow-up if not improved after that

## 2018-09-17 NOTE — Progress Notes (Signed)
Cardiology Office Note   Date:  09/19/2018   ID:  Julie Norton, DOB [birth year], MRN [id number]  PCP:  Eulas Post, MD  Cardiologist: Espen Bethel Martinique MD  Chief Complaint  Patient presents with  . Follow-up    6 months.  . Chest Pain  . Atrial Fibrillation      History of Present Illness: Julie Norton is a 85 y.o. female who presents for follow up of Atrial fibrillation.   She has a past history of hypercholesterolemia. She also has a history of DM on  insulin. Echo in 2015 showed mild MR with normal LV function. She had a normal Myoview study in November 2017.   She did undergo GI evaluation in September 2018  for iron deficiency anemia. She had a small hiatal hernia and diverticulosis. One small rectal polyp.   She was seen by Dr. Elease Hashimoto on 09/20/17. Found to be in Afib with rate 95. Started on Eliquis. Follow up Echo showed mild biatrial enlargement and normal LV function. She  noted some fatigue, decreased energy and leg swelling. Some dyspnea and chest tightness. Was started on lasix with improvement of these symptoms. She subsequently underwent TEE guided DCCV on 10/28/17 with return to NSR.   On follow up today she reports a recent URI. Tested negative for the flu and PNA. Just finished a Zpak. Still has some cough, periods of fatigue. No Afib. Denies chest pain or dyspnea.    Past Medical History:  Diagnosis Date  . Abdominal adhesions   . Abdominal gas pain    suspected -- may be related to intermittent right upper quadrant discomfort radiating around her back      . Breast cancer ([place])   . Breast mass    left breast  . Cancer ([place])    left breast  . Chest pain    intermittent right upper quadrant discomfort radiating around her back      . Complication of anesthesia   . Diabetes mellitus without complication ([place])    Type II  . Dyspepsia   . GERD (gastroesophageal reflux disease)   . H/O: hysterectomy 1978  . Hypercholesteremia   .  Hypothyroidism   . Personal history of radiation therapy   . Pneumonia   . PONV (postoperative nausea and vomiting)   . Tendinitis    of the right arm and shoulder  . Torn tendon    right shoulder    Past Surgical History:  Procedure Laterality Date  . ABDOMINAL HYSTERECTOMY    . APPENDECTOMY  1978  . BLADDER REPAIR    . BREAST LUMPECTOMY Left 02/24/2011   central partial mastectomy - dr Margot Chimes  . BREAST REDUCTION SURGERY    . CARDIOVERSION N/A 10/28/2017   Procedure: CARDIOVERSION;  Surgeon: Lelon Perla, MD;  Location: [facility] ENDOSCOPY;  Service: Cardiovascular;  Laterality: N/A;  . COLONOSCOPY WITH PROPOFOL N/A 03/24/2017   Procedure: COLONOSCOPY WITH PROPOFOL;  Surgeon: Wilford Corner, MD;  Location: [place];  Service: Endoscopy;  Laterality: N/A;  . ESOPHAGOGASTRODUODENOSCOPY (EGD) WITH PROPOFOL N/A 03/24/2017   Procedure: ESOPHAGOGASTRODUODENOSCOPY (EGD) WITH PROPOFOL;  Surgeon: Wilford Corner, MD;  Location: [place];  Service: Endoscopy;  Laterality: N/A;  . LIPOMA EXCISION Right   . REDUCTION MAMMAPLASTY Bilateral 1998  . RETINAL DETACHMENT SURGERY Left   . SALPINGOOPHORECTOMY    . TEE WITHOUT CARDIOVERSION N/A 10/28/2017   Procedure: TRANSESOPHAGEAL ECHOCARDIOGRAM (TEE);  Surgeon: Lelon Perla, MD;  Location: [place];  Service: Cardiovascular;  Laterality: N/A;  . TONSILLECTOMY AND ADENOIDECTOMY  1950   at 85 years of age     Current Outpatient Medications  Medication Sig Dispense Refill  . Albuterol Sulfate (PROAIR RESPICLICK) 175 (90 Base) MCG/ACT AEPB Inhale 2 Act into the lungs every 6 (six) hours as needed. For wheezing 1 each 1  . atorvastatin (LIPITOR) 10 MG tablet TAKE ONE-HALF TABLET BY  MOUTH EVERY OTHER DAY OR AS DIRECTED 45 tablet 1  . azithromycin (ZITHROMAX) 250 MG tablet Take as directed on the package. 6 tablet 0  . Calcium Carbonate-Vitamin D (CALCIUM 600+D) 600-200 MG-UNIT TABS Take 1 tablet by mouth 3 (three) times a week.      . Cholecalciferol (VITAMIN D PO) Take 1 tablet by mouth daily.     Marland Kitchen ELIQUIS 5 MG TABS tablet TAKE 1 TABLET BY MOUTH TWICE A DAY 60 tablet 5  . famotidine (PEPCID) 20 MG tablet Take 20 mg by mouth 2 (two) times daily.    Marland Kitchen glucosamine-chondroitin 500-400 MG tablet Take 1 tablet by mouth 3 (three) times a week.     Marland Kitchen glucose blood (ONE TOUCH ULTRA TEST) test strip Check once daily. E11.40 100 each 11  . Insulin Pen Needle 29G X 5MM MISC Use once daily 100 each 3  . levothyroxine (SYNTHROID, LEVOTHROID) 50 MCG tablet TAKE 1 TABLET BY MOUTH  DAILY 90 tablet 1  . metFORMIN (GLUCOPHAGE-XR) 500 MG 24 hr tablet TAKE 1 TABLET BY MOUTH  TWICE A DAY 180 tablet 1  . metroNIDAZOLE (FLAGYL) 500 MG tablet Take 1 tablet (500 mg total) by mouth 3 (three) times daily. 30 tablet 0  . omeprazole (PRILOSEC) 20 MG capsule     . ONE TOUCH ULTRA TEST test strip CHECK ONCE A DAY 50 each 3  . traZODone (DESYREL) 50 MG tablet TAKE 1/2 TO 1 TABLET BY MOUTH AT BEDTIME AS NEEDED FOR SLEEP 30 tablet 5  . TRESIBA FLEXTOUCH 100 UNIT/ML SOPN FlexTouch Pen INJECT 0.24MLS (24 UNITS TOTAL) INTO THE SKIN DAILY AT 10PM 15 pen 1  . triamcinolone cream (KENALOG) 0.1 % Apply 1 application topically 2 (two) times daily as needed. (Patient taking differently: Apply 1 application topically 2 (two) times daily as needed (for itching). ) 30 g 2  . furosemide (LASIX) 20 MG tablet Take 1 tablet (20 mg total) by mouth daily. (Patient taking differently: Take 20 mg by mouth as needed. ) 30 tablet 6   Current Facility-Administered Medications  Medication Dose Route Frequency Provider Last Rate Last Dose  . aspirin chewable tablet 81 mg  81 mg Oral Once Martinique, Lisia [place] M, MD        Allergies:   Bactrim; Dilaudid [hydromorphone hcl]; Hydrocodone; Invokana [canagliflozin]; and Macrodantin    Social History:  The patient  reports that she has never smoked. She has never used smokeless tobacco. She reports that she does not drink alcohol or use  drugs.   Family History:  The patient's family history includes Aortic aneurysm in her mother and sister; Cancer in her mother; Diabetes in her mother; Heart attack in her mother; Hypertension in her mother; Pneumonia in her father; Stroke in her mother.    ROS:  Please see the history of present illness.   Otherwise, review of systems are positive for none.   All other systems are reviewed and negative.    PHYSICAL EXAM: VS:  BP 134/66 (BP Location: Left Arm, Patient Position: Sitting, Cuff Size: Normal)   Pulse 70   Ht  5' 1.5" (1.562 m)   Wt 150 lb (68 kg)   BMI 27.88 kg/m  , BMI Body mass index is 27.88 kg/m. GENERAL:  Well appearing WF  In NAD HEENT:  PERRL, EOMI, sclera are clear. Oropharynx is clear. NECK:  No jugular venous distention, carotid upstroke brisk and symmetric, no bruits, no thyromegaly or adenopathy LUNGS:  Clear to auscultation bilaterally CHEST:  Unremarkable HEART:  RRR,  PMI not displaced or sustained,S1 and S2 within normal limits, no S3, no S4: no clicks, no rubs, no murmurs ABD:  Soft, nontender. BS +, no masses or bruits. No hepatomegaly, no splenomegaly EXT:  2 + pulses throughout, no edema, no cyanosis no clubbing SKIN:  Warm and dry.  No rashes NEURO:  Alert and oriented x 3. Cranial nerves II through XII intact. PSYCH:  Cognitively intact  EKG:  EKG is ordered today. NSR with RBBB. I have personally reviewed and interpreted this study.  Recent Labs: 09/20/2017: TSH 1.61 03/28/2018: ALT 13; BUN 11; Creatinine, Ser 0.73; Potassium 4.3; Sodium 140 07/19/2018: Hemoglobin 11.1; Platelets 184.0    Lipid Panel    Component Value Date/Time   CHOL 152 03/28/2018 1015   CHOL 135 04/26/2017 0840   TRIG 123.0 03/28/2018 1015   HDL 45.10 03/28/2018 1015   HDL 45 04/26/2017 0840   CHOLHDL 3 03/28/2018 1015   VLDL 24.6 03/28/2018 1015   LDLCALC 82 03/28/2018 1015   LDLCALC 70 04/26/2017 0840      Wt Readings from Last 3 Encounters:  09/19/18 150 lb  (68 kg)  09/12/18 148 lb 14.4 oz (67.5 kg)  08/24/18 149 lb 14.4 oz (68 kg)      Lexiscan myoview 05/26/16:Study Highlights    Nuclear stress EF: 72%.  There was no ST segment deviation noted during stress.  This is a low risk study.  The left ventricular ejection fraction is hyperdynamic (>65%).   1. Small, mild mid-anteroseptal perfusion defect.  This defect was actually more intense at rest than with stress.  No ischemia.  Suspect attenuation.  2. EF 72% with normal wall motion.  3. Overall low risk study.     Echo 09/24/17: Study Conclusions  - Left ventricle: The cavity size was normal. Systolic function was   normal. The estimated ejection fraction was in the range of 55%   to 60%. Wall motion was normal; there were no regional wall   motion abnormalities. - Mitral valve: There was mild regurgitation. - Left atrium: The atrium was mildly dilated. - Right atrium: The atrium was mildly dilated. - Atrial septum: No defect or patent foramen ovale was identified. - Pulmonary arteries: Systolic pressure was mildly increased. PA   peak pressure: 37 mm Hg (S).    ASSESSMENT AND PLAN:  1. Atrial fibrillation. [place] vasc score of 4. On Eliquis.  S/p TEE guided DCCV in April 2019. Maintaining NSR so far.   2.  Hypercholesterolemia-  She is at goal. Continue low dose lipitor.   3. History of breast cancer of left breast treated with lumpectomy and radiation therapy  4. Diabetes mellitus- now on insulin.    Follow up in 6 months  Signed, Kemyra August Martinique MD, [facility]   09/19/2018 3:53 PM    [place]

## 2018-09-19 ENCOUNTER — Encounter: Payer: Self-pay | Admitting: Cardiology

## 2018-09-19 ENCOUNTER — Ambulatory Visit: Payer: Medicare Other | Admitting: Cardiology

## 2018-09-19 ENCOUNTER — Other Ambulatory Visit: Payer: Self-pay

## 2018-09-19 VITALS — BP 134/66 | HR 70 | Ht 61.5 in | Wt 150.0 lb

## 2018-09-19 DIAGNOSIS — I4510 Unspecified right bundle-branch block: Secondary | ICD-10-CM

## 2018-09-19 DIAGNOSIS — I4819 Other persistent atrial fibrillation: Secondary | ICD-10-CM | POA: Diagnosis not present

## 2018-09-19 DIAGNOSIS — E7800 Pure hypercholesterolemia, unspecified: Secondary | ICD-10-CM

## 2018-09-22 ENCOUNTER — Other Ambulatory Visit: Payer: Self-pay | Admitting: Family Medicine

## 2018-09-22 DIAGNOSIS — E0590 Thyrotoxicosis, unspecified without thyrotoxic crisis or storm: Secondary | ICD-10-CM

## 2018-09-22 NOTE — Telephone Encounter (Signed)
Refill for one year 

## 2018-09-22 NOTE — Telephone Encounter (Signed)
Rx done. 

## 2018-09-28 DIAGNOSIS — Z794 Long term (current) use of insulin: Secondary | ICD-10-CM | POA: Diagnosis not present

## 2018-09-28 DIAGNOSIS — E119 Type 2 diabetes mellitus without complications: Secondary | ICD-10-CM | POA: Diagnosis not present

## 2018-11-09 ENCOUNTER — Other Ambulatory Visit: Payer: Self-pay

## 2018-11-09 ENCOUNTER — Ambulatory Visit (INDEPENDENT_AMBULATORY_CARE_PROVIDER_SITE_OTHER): Payer: Medicare Other | Admitting: Family Medicine

## 2018-11-09 DIAGNOSIS — E1165 Type 2 diabetes mellitus with hyperglycemia: Secondary | ICD-10-CM | POA: Diagnosis not present

## 2018-11-09 DIAGNOSIS — I4819 Other persistent atrial fibrillation: Secondary | ICD-10-CM | POA: Diagnosis not present

## 2018-11-09 DIAGNOSIS — R600 Localized edema: Secondary | ICD-10-CM | POA: Diagnosis not present

## 2018-11-09 NOTE — Progress Notes (Signed)
Patient ID: Julie Norton, female   DOB: Oct 30, 1929, 85 y.o.   MRN: [id number]  This visit type was conducted due to national recommendations for restrictions regarding the COVID-19 pandemic in an effort to limit this patient's exposure and mitigate transmission in our community.   Virtual Visit via Video Note  I connected with Glenda Chroman on 11/09/18 at  4:00 PM EDT by a video enabled telemedicine application and verified that I am speaking with the correct person using two identifiers.  Location patient: home Location provider:work or home office Persons participating in the virtual visit: patient, provider  I discussed the limitations of evaluation and management by telemedicine and the availability of in person appointments. The patient expressed understanding and agreed to proceed.   HPI: Patient has had some increased bilateral leg and foot edema past few days.  She states she has had some dietary changes in terms of doing more takeout's but is not sure that her sodium intake is much different than usual.  She states her baseline weight is around 149 pounds.  3 days ago this was up to 155 pounds.  She takes Lasix as needed and started back on that and has had some improvement.  Weight today was 152 pounds.  She denies any dyspnea.  No orthopnea.  Swelling does go down slightly at night but not completely.  She states her blood pressures been very stable.  Blood sugars have been much improved with fastings usually around 100.  She had echocardiogram 4/19 with ejection fraction 50 to 55%.  She has history of chronic A. fib.  No recent tachycardia.   ROS: See pertinent positives and negatives per HPI.  Past Medical History:  Diagnosis Date  . Abdominal adhesions   . Abdominal gas pain    suspected -- may be related to intermittent right upper quadrant discomfort radiating around her back      . Breast cancer ([place])   . Breast mass    left breast  . Cancer ([place])    left breast   . Chest pain    intermittent right upper quadrant discomfort radiating around her back      . Complication of anesthesia   . Diabetes mellitus without complication ([place])    Type II  . Dyspepsia   . GERD (gastroesophageal reflux disease)   . H/O: hysterectomy 1978  . Hypercholesteremia   . Hypothyroidism   . Personal history of radiation therapy   . Pneumonia   . PONV (postoperative nausea and vomiting)   . Tendinitis    of the right arm and shoulder  . Torn tendon    right shoulder    Past Surgical History:  Procedure Laterality Date  . ABDOMINAL HYSTERECTOMY    . APPENDECTOMY  1978  . BLADDER REPAIR    . BREAST LUMPECTOMY Left 02/24/2011   central partial mastectomy - dr Margot Chimes  . BREAST REDUCTION SURGERY    . CARDIOVERSION N/A 10/28/2017   Procedure: CARDIOVERSION;  Surgeon: Lelon Perla, MD;  Location: [facility] ENDOSCOPY;  Service: Cardiovascular;  Laterality: N/A;  . COLONOSCOPY WITH PROPOFOL N/A 03/24/2017   Procedure: COLONOSCOPY WITH PROPOFOL;  Surgeon: Wilford Corner, MD;  Location: [place];  Service: Endoscopy;  Laterality: N/A;  . ESOPHAGOGASTRODUODENOSCOPY (EGD) WITH PROPOFOL N/A 03/24/2017   Procedure: ESOPHAGOGASTRODUODENOSCOPY (EGD) WITH PROPOFOL;  Surgeon: Wilford Corner, MD;  Location: [place];  Service: Endoscopy;  Laterality: N/A;  . LIPOMA EXCISION Right   . REDUCTION MAMMAPLASTY Bilateral 1998  . RETINAL  DETACHMENT SURGERY Left   . SALPINGOOPHORECTOMY    . TEE WITHOUT CARDIOVERSION N/A 10/28/2017   Procedure: TRANSESOPHAGEAL ECHOCARDIOGRAM (TEE);  Surgeon: Lelon Perla, MD;  Location: [facility] ENDOSCOPY;  Service: Cardiovascular;  Laterality: N/A;  . [place]   at 85 years of age    Family History  Problem Relation Age of Onset  . Pneumonia Father   . Heart attack Mother   . Hypertension Mother   . Stroke Mother   . Diabetes Mother   . Aortic aneurysm Mother        abdominal aortic aneurysm  . Cancer  Mother        bladder  . Aortic aneurysm Sister        abdominal aortic aneurysm    SOCIAL HX: Lives with her husband who has some dementia issue.  Just had a great grandchild which was delivered last week.  Non-smoker.   Current Outpatient Medications:  .  Albuterol Sulfate (PROAIR RESPICLICK) 409 (90 Base) MCG/ACT AEPB, Inhale 2 Act into the lungs every 6 (six) hours as needed. For wheezing, Disp: 1 each, Rfl: 1 .  atorvastatin (LIPITOR) 10 MG tablet, TAKE ONE-HALF TABLET BY  MOUTH EVERY OTHER DAY OR AS DIRECTED, Disp: 45 tablet, Rfl: 1 .  Calcium Carbonate-Vitamin D (CALCIUM 600+D) 600-200 MG-UNIT TABS, Take 1 tablet by mouth 3 (three) times a week. , Disp: , Rfl:  .  Cholecalciferol (VITAMIN D PO), Take 1 tablet by mouth daily. , Disp: , Rfl:  .  ELIQUIS 5 MG TABS tablet, TAKE 1 TABLET BY MOUTH TWICE A DAY, Disp: 60 tablet, Rfl: 5 .  famotidine (PEPCID) 20 MG tablet, Take 20 mg by mouth 2 (two) times daily., Disp: , Rfl:  .  furosemide (LASIX) 20 MG tablet, Take 1 tablet (20 mg total) by mouth daily. (Patient taking differently: Take 20 mg by mouth as needed. ), Disp: 30 tablet, Rfl: 6 .  glucosamine-chondroitin 500-400 MG tablet, Take 1 tablet by mouth 3 (three) times a week. , Disp: , Rfl:  .  glucose blood (ONE TOUCH ULTRA TEST) test strip, Check once daily. E11.40, Disp: 100 each, Rfl: 11 .  Insulin Pen Needle 29G X 5MM MISC, Use once daily, Disp: 100 each, Rfl: 3 .  levothyroxine (SYNTHROID, LEVOTHROID) 50 MCG tablet, TAKE 1 TABLET BY MOUTH  DAILY, Disp: 90 tablet, Rfl: 1 .  metFORMIN (GLUCOPHAGE-XR) 500 MG 24 hr tablet, TAKE 1 TABLET BY MOUTH  TWICE A DAY, Disp: 180 tablet, Rfl: 1 .  omeprazole (PRILOSEC) 20 MG capsule, TAKE 1 CAPSULE BY MOUTH TWO TIMES DAILY, Disp: 180 capsule, Rfl: 3 .  ONE TOUCH ULTRA TEST test strip, CHECK ONCE A DAY, Disp: 50 each, Rfl: 3 .  traZODone (DESYREL) 50 MG tablet, TAKE 1/2 TO 1 TABLET BY MOUTH AT BEDTIME AS NEEDED FOR SLEEP, Disp: 30 tablet, Rfl:  5 .  TRESIBA FLEXTOUCH 100 UNIT/ML SOPN FlexTouch Pen, INJECT 0.24MLS (24 UNITS TOTAL) INTO THE SKIN DAILY AT 10PM, Disp: 15 pen, Rfl: 1 .  triamcinolone cream (KENALOG) 0.1 %, Apply 1 application topically 2 (two) times daily as needed. (Patient taking differently: Apply 1 application topically 2 (two) times daily as needed (for itching). ), Disp: 30 g, Rfl: 2  Current Facility-Administered Medications:  .  aspirin chewable tablet 81 mg, 81 mg, Oral, Once, Martinique, Ander Slade, MD  EXAMTonette Bihari per patient if applicable:  GENERAL: alert, oriented, appears well and in no acute distress  HEENT:  atraumatic, conjunttiva clear, no obvious abnormalities on inspection of external nose and ears  NECK: normal movements of the head and neck  LUNGS: on inspection no signs of respiratory distress, breathing rate appears normal, no obvious gross SOB, gasping or wheezing  CV: no obvious cyanosis  MS: moves all visible extremities without noticeable abnormality  PSYCH/NEURO: pleasant and cooperative, no obvious depression or anxiety, speech and thought processing grossly intact  ASSESSMENT AND PLAN:  Discussed the following assessment and plan:  #1 bilateral leg edema.  No history of systolic failure.  Possibly related to increased sodium intake recently from dietary change.  Probably has some diastolic dysfunction -Frequent leg elevation -Watch sodium intake and try to keep less than 2000 mg daily -Continue Lasix 20 mg daily for the next few days -Daily weights and be in touch if weight not back to baseline over the next few days -Consider further labs and further evaluation if not improving further over the next 2 to 3 days  #2 chronic atrial fibrillation.  Patient remains on Eliquis.  Rate has been stable  #3 type 2 diabetes which has been improving.  She has been able to reduce her insulin from 26 units to 14 units daily     I discussed the assessment and treatment plan with the patient.  The patient was provided an opportunity to ask questions and all were answered. The patient agreed with the plan and demonstrated an understanding of the instructions.   The patient was advised to call back or seek an in-person evaluation if the symptoms worsen or if the condition fails to improve as anticipated.   [name], MD

## 2018-11-15 ENCOUNTER — Other Ambulatory Visit: Payer: Self-pay | Admitting: Family Medicine

## 2018-11-23 ENCOUNTER — Other Ambulatory Visit: Payer: Self-pay | Admitting: Family Medicine

## 2018-12-21 ENCOUNTER — Other Ambulatory Visit: Payer: Self-pay

## 2018-12-21 ENCOUNTER — Encounter: Payer: Self-pay | Admitting: Family Medicine

## 2018-12-21 ENCOUNTER — Ambulatory Visit (INDEPENDENT_AMBULATORY_CARE_PROVIDER_SITE_OTHER): Payer: Medicare Other | Admitting: Family Medicine

## 2018-12-21 ENCOUNTER — Ambulatory Visit: Payer: Medicare Other | Admitting: Family Medicine

## 2018-12-21 VITALS — BP 134/76 | HR 105 | Temp 97.9°F | Ht 61.5 in | Wt 150.0 lb

## 2018-12-21 DIAGNOSIS — E1165 Type 2 diabetes mellitus with hyperglycemia: Secondary | ICD-10-CM | POA: Diagnosis not present

## 2018-12-21 DIAGNOSIS — E039 Hypothyroidism, unspecified: Secondary | ICD-10-CM | POA: Diagnosis not present

## 2018-12-21 DIAGNOSIS — R1031 Right lower quadrant pain: Secondary | ICD-10-CM

## 2018-12-21 DIAGNOSIS — R5383 Other fatigue: Secondary | ICD-10-CM | POA: Diagnosis not present

## 2018-12-21 DIAGNOSIS — D649 Anemia, unspecified: Secondary | ICD-10-CM | POA: Diagnosis not present

## 2018-12-21 DIAGNOSIS — I4819 Other persistent atrial fibrillation: Secondary | ICD-10-CM

## 2018-12-21 LAB — POCT GLYCOSYLATED HEMOGLOBIN (HGB A1C): Hemoglobin A1C: 7.3 % — AB (ref 4.0–5.6)

## 2018-12-21 LAB — CBC WITH DIFFERENTIAL/PLATELET
Basophils Absolute: 0 10*3/uL (ref 0.0–0.1)
Basophils Relative: 0.3 % (ref 0.0–3.0)
Eosinophils Absolute: 0.1 10*3/uL (ref 0.0–0.7)
Eosinophils Relative: 2 % (ref 0.0–5.0)
HCT: 32.8 % — ABNORMAL LOW (ref 36.0–46.0)
Hemoglobin: 10.3 g/dL — ABNORMAL LOW (ref 12.0–15.0)
Lymphocytes Relative: 25.8 % (ref 12.0–46.0)
Lymphs Abs: 1.3 10*3/uL (ref 0.7–4.0)
MCHC: 31.4 g/dL (ref 30.0–36.0)
MCV: 74.8 fl — ABNORMAL LOW (ref 78.0–100.0)
Monocytes Absolute: 0.5 10*3/uL (ref 0.1–1.0)
Monocytes Relative: 10.5 % (ref 3.0–12.0)
Neutro Abs: 3 10*3/uL (ref 1.4–7.7)
Neutrophils Relative %: 61.4 % (ref 43.0–77.0)
Platelets: 170 10*3/uL (ref 150.0–400.0)
RBC: 4.39 Mil/uL (ref 3.87–5.11)
RDW: 15.9 % — ABNORMAL HIGH (ref 11.5–15.5)
WBC: 5 10*3/uL (ref 4.0–10.5)

## 2018-12-21 LAB — BASIC METABOLIC PANEL
BUN: 16 mg/dL (ref 6–23)
CO2: 26 mEq/L (ref 19–32)
Calcium: 9.2 mg/dL (ref 8.4–10.5)
Chloride: 106 mEq/L (ref 96–112)
Creatinine, Ser: 0.74 mg/dL (ref 0.40–1.20)
GFR: 74.29 mL/min (ref >60.00)
Glucose, Bld: 104 mg/dL — ABNORMAL HIGH (ref 70–99)
Potassium: 4 mEq/L (ref 3.5–5.1)
Sodium: 142 mEq/L (ref 135–145)

## 2018-12-21 LAB — TSH: TSH: 3.03 u[IU]/mL (ref 0.35–4.50)

## 2018-12-21 LAB — VITAMIN B12: Vitamin B-12: 163 pg/mL — ABNORMAL LOW (ref 211–911)

## 2018-12-21 NOTE — Progress Notes (Signed)
Subjective:     Patient ID: Julie Norton, female   DOB: Feb 24, 1930, 85 y.o.   MRN: [id number]  HPI Patient is seen for medical follow-up.  She has had some general fatigue issues recently has had ongoing problems with scoliosis and back pain especially the right lower lumbar area.  Minimal relief with Tylenol.  She wishes to try to avoid other pain medicines.  She is taken tramadol in the past which did not help much.  She takes Eliquis and cannot take nonsteroidals.  She has been very busy take care of her husband who has had some progressive cognitive deficits  Type 2 diabetes.  Her recent blood sugars have been relatively stable.  She takes [place] and is reduced this to 18 units daily and fasting sugars usually less than 130.  No polyuria or polydipsia.  Appetite and weight are stable.  Hypothyroidism.  On replacement with levothyroxine 50 mcg daily.  Has some fatigue issues as above  History of chronic normocytic anemia with hemoglobin 11 range.  She is wondering about B12 levels.  She does take omeprazole and has some increased risk of poor absorption from that.  Past Medical History:  Diagnosis Date  . Abdominal adhesions   . Abdominal gas pain    suspected -- may be related to intermittent right upper quadrant discomfort radiating around her back      . Breast cancer ([place])   . Breast mass    left breast  . Cancer ([place])    left breast  . Chest pain    intermittent right upper quadrant discomfort radiating around her back      . Complication of anesthesia   . Diabetes mellitus without complication ([place])    Type II  . Dyspepsia   . GERD (gastroesophageal reflux disease)   . H/O: hysterectomy 1978  . Hypercholesteremia   . Hypothyroidism   . Personal history of radiation therapy   . Pneumonia   . PONV (postoperative nausea and vomiting)   . Tendinitis    of the right arm and shoulder  . Torn tendon    right shoulder   Past Surgical History:  Procedure Laterality  Date  . ABDOMINAL HYSTERECTOMY    . APPENDECTOMY  1978  . BLADDER REPAIR    . BREAST LUMPECTOMY Left 02/24/2011   central partial mastectomy - dr Margot Chimes  . BREAST REDUCTION SURGERY    . CARDIOVERSION N/A 10/28/2017   Procedure: CARDIOVERSION;  Surgeon: Lelon Perla, MD;  Location: [facility] ENDOSCOPY;  Service: Cardiovascular;  Laterality: N/A;  . COLONOSCOPY WITH PROPOFOL N/A 03/24/2017   Procedure: COLONOSCOPY WITH PROPOFOL;  Surgeon: Wilford Corner, MD;  Location: [place];  Service: Endoscopy;  Laterality: N/A;  . ESOPHAGOGASTRODUODENOSCOPY (EGD) WITH PROPOFOL N/A 03/24/2017   Procedure: ESOPHAGOGASTRODUODENOSCOPY (EGD) WITH PROPOFOL;  Surgeon: Wilford Corner, MD;  Location: [place];  Service: Endoscopy;  Laterality: N/A;  . LIPOMA EXCISION Right   . REDUCTION MAMMAPLASTY Bilateral 1998  . RETINAL DETACHMENT SURGERY Left   . SALPINGOOPHORECTOMY    . TEE WITHOUT CARDIOVERSION N/A 10/28/2017   Procedure: TRANSESOPHAGEAL ECHOCARDIOGRAM (TEE);  Surgeon: Lelon Perla, MD;  Location: [facility] ENDOSCOPY;  Service: Cardiovascular;  Laterality: N/A;  . [place]   at 85 years of age    reports that she has never smoked. She has never used smokeless tobacco. She reports that she does not drink alcohol or use drugs. family history includes Aortic aneurysm in her mother and sister; Cancer  in her mother; Diabetes in her mother; Heart attack in her mother; Hypertension in her mother; Pneumonia in her father; Stroke in her mother. Allergies  Allergen Reactions  . Bactrim Nausea And Vomiting  . Dilaudid [Hydromorphone Hcl] Other (See Comments)    Unknown  . Hydrocodone Nausea And Vomiting  . Invokana [Canagliflozin] Itching and Other (See Comments)    Yeast Infections   . Macrodantin Nausea And Vomiting       Review of Systems  Constitutional: Negative for appetite change, chills, fever and unexpected weight change.  Respiratory: Negative for cough and  shortness of breath.   Cardiovascular: Negative for chest pain.  Gastrointestinal: Positive for abdominal pain. Negative for nausea and vomiting.  Genitourinary: Negative for dysuria.  Musculoskeletal: Positive for back pain.  Neurological: Negative for dizziness.  Psychiatric/Behavioral: Negative for confusion.       Objective:   Physical Exam Constitutional:      Appearance: Normal appearance.  Neck:     Musculoskeletal: Neck supple.  Cardiovascular:     Comments: irregular rhythm consistent with her atrial fibrillation.  Rate is 80 Pulmonary:     Effort: Pulmonary effort is normal.     Breath sounds: Normal breath sounds. No wheezing or rales.  Musculoskeletal:     Right lower leg: No edema.     Left lower leg: No edema.  Neurological:     Mental Status: She is alert.        Assessment:     #1 type 2 diabetes.  A1c today stable at 7.3%.  #2 hypothyroidism.  Overdue for TSH  #3 chronic back pain  #4 normocytic anemia  #5 chronic atrial fibrillation which is currently rate controlled.  She remains on Eliquis    Plan:     -Check further labs today with TSH, basic metabolic panel, CBC, V37 level -Discussed pain management options for her back pain and she is not interested in further medication at this point.  She has seen orthopedist in the past and will consider follow-up with them -Recommend routine follow-up in 6 months and sooner as needed  Eulas Post MD [facility] at [facility]

## 2018-12-23 ENCOUNTER — Other Ambulatory Visit: Payer: Self-pay | Admitting: Family Medicine

## 2018-12-23 ENCOUNTER — Telehealth: Payer: Self-pay

## 2018-12-23 ENCOUNTER — Other Ambulatory Visit: Payer: Self-pay

## 2018-12-23 ENCOUNTER — Other Ambulatory Visit: Payer: Medicare Other

## 2018-12-23 DIAGNOSIS — R109 Unspecified abdominal pain: Secondary | ICD-10-CM | POA: Diagnosis not present

## 2018-12-23 DIAGNOSIS — E538 Deficiency of other specified B group vitamins: Secondary | ICD-10-CM

## 2018-12-23 DIAGNOSIS — R300 Dysuria: Secondary | ICD-10-CM

## 2018-12-23 LAB — POCT URINALYSIS DIPSTICK
Bilirubin, UA: NEGATIVE
Blood, UA: NEGATIVE
Glucose, UA: NEGATIVE
Ketones, UA: NEGATIVE
Leukocytes, UA: NEGATIVE
Nitrite, UA: POSITIVE
Protein, UA: NEGATIVE
Spec Grav, UA: 1.015 (ref 1.010–1.025)
Urobilinogen, UA: 0.2 E.U./dL
pH, UA: 6 (ref 5.0–8.0)

## 2018-12-23 MED ORDER — CEPHALEXIN 500 MG PO CAPS: 500.0000 mg | ORAL_CAPSULE | Freq: Three times a day (TID) | ORAL | 0 refills | Status: DC

## 2018-12-23 NOTE — Telephone Encounter (Signed)
OK to get UA

## 2018-12-23 NOTE — Telephone Encounter (Signed)
OK to order POCT UA? Please advise.  Copied from [place] [id number]. Topic: General - Inquiry >> Dec 23, 2018 10:13 AM Scherrie Gerlach wrote: Reason for CRM: pt just seen Wed 6/17 and states last night she was having pain at the vagina area, and some side pain.  Pt thinks now her issue may be a UTI. Pt requesting to come by and give a UA sample. Pt states since she was just seen, dr Elease Hashimoto would understand not having to make another visit. Pt does not think she should need OV, just lab. Please advise.

## 2018-12-25 LAB — URINE CULTURE
MICRO NUMBER:: 588648
SPECIMEN QUALITY:: ADEQUATE

## 2018-12-26 ENCOUNTER — Ambulatory Visit (INDEPENDENT_AMBULATORY_CARE_PROVIDER_SITE_OTHER): Payer: Medicare Other

## 2018-12-26 ENCOUNTER — Other Ambulatory Visit: Payer: Self-pay

## 2018-12-26 DIAGNOSIS — E538 Deficiency of other specified B group vitamins: Secondary | ICD-10-CM | POA: Diagnosis not present

## 2018-12-26 MED ORDER — CYANOCOBALAMIN 1000 MCG/ML IJ SOLN
1000.0000 ug | Freq: Once | INTRAMUSCULAR | Status: AC
  Administered 2018-12-26: 1000 ug via INTRAMUSCULAR

## 2018-12-26 NOTE — Progress Notes (Signed)
Per orders of Dr. Elease Hashimoto, injection of Cyanocobalammin 1000 ml given by Wyvonne Lenz. Patient tolerated injection well.

## 2018-12-29 DIAGNOSIS — Z794 Long term (current) use of insulin: Secondary | ICD-10-CM | POA: Diagnosis not present

## 2018-12-29 DIAGNOSIS — E119 Type 2 diabetes mellitus without complications: Secondary | ICD-10-CM | POA: Diagnosis not present

## 2019-01-02 ENCOUNTER — Other Ambulatory Visit: Payer: Self-pay

## 2019-01-02 ENCOUNTER — Ambulatory Visit (INDEPENDENT_AMBULATORY_CARE_PROVIDER_SITE_OTHER): Payer: Medicare Other | Admitting: *Deleted

## 2019-01-02 DIAGNOSIS — E538 Deficiency of other specified B group vitamins: Secondary | ICD-10-CM | POA: Diagnosis not present

## 2019-01-02 MED ORDER — CYANOCOBALAMIN 1000 MCG/ML IJ SOLN
1000.0000 ug | Freq: Once | INTRAMUSCULAR | Status: AC
  Administered 2019-01-02: 1000 ug via INTRAMUSCULAR

## 2019-01-03 NOTE — Progress Notes (Signed)
Agree with injection of B12 as given.  We are giving her 1,000 mcg weekly for 4 weeks and then will transition over to oral B12 1,000 mcg daily.  Repeat B12 level in about 3 months.

## 2019-01-09 ENCOUNTER — Other Ambulatory Visit: Payer: Self-pay | Admitting: Obstetrics & Gynecology

## 2019-01-09 DIAGNOSIS — Z1231 Encounter for screening mammogram for malignant neoplasm of breast: Secondary | ICD-10-CM

## 2019-01-11 ENCOUNTER — Other Ambulatory Visit: Payer: Self-pay

## 2019-01-11 ENCOUNTER — Ambulatory Visit (INDEPENDENT_AMBULATORY_CARE_PROVIDER_SITE_OTHER): Payer: Medicare Other | Admitting: *Deleted

## 2019-01-11 DIAGNOSIS — E538 Deficiency of other specified B group vitamins: Secondary | ICD-10-CM

## 2019-01-11 MED ORDER — CYANOCOBALAMIN 1000 MCG/ML IJ SOLN
1000.0000 ug | Freq: Once | INTRAMUSCULAR | Status: AC
  Administered 2019-01-11: 12:00:00 1000 ug via INTRAMUSCULAR

## 2019-01-11 NOTE — Progress Notes (Addendum)
Per orders of Dr. Elease Hashimoto, injection of B12 given by Westley Hummer. Patient tolerated injection well.  Agree with injection as above  Eulas Post MD [facility] at [facility]

## 2019-01-18 ENCOUNTER — Ambulatory Visit (INDEPENDENT_AMBULATORY_CARE_PROVIDER_SITE_OTHER): Payer: Medicare Other | Admitting: *Deleted

## 2019-01-18 ENCOUNTER — Other Ambulatory Visit: Payer: Self-pay

## 2019-01-18 DIAGNOSIS — E538 Deficiency of other specified B group vitamins: Secondary | ICD-10-CM | POA: Diagnosis not present

## 2019-01-18 MED ORDER — CYANOCOBALAMIN 1000 MCG/ML IJ SOLN
1000.0000 ug | Freq: Once | INTRAMUSCULAR | Status: AC
  Administered 2019-01-18: 1000 ug via INTRAMUSCULAR

## 2019-01-20 DIAGNOSIS — M545 Low back pain: Secondary | ICD-10-CM | POA: Diagnosis not present

## 2019-01-20 DIAGNOSIS — M5136 Other intervertebral disc degeneration, lumbar region: Secondary | ICD-10-CM | POA: Diagnosis not present

## 2019-01-20 DIAGNOSIS — I4891 Unspecified atrial fibrillation: Secondary | ICD-10-CM | POA: Diagnosis not present

## 2019-01-20 DIAGNOSIS — E109 Type 1 diabetes mellitus without complications: Secondary | ICD-10-CM | POA: Diagnosis not present

## 2019-01-25 NOTE — Progress Notes (Signed)
Agree with B12 injection as given.  Eulas Post MD [place] [facility] at [facility]

## 2019-01-26 DIAGNOSIS — H40003 Preglaucoma, unspecified, bilateral: Secondary | ICD-10-CM | POA: Diagnosis not present

## 2019-01-30 DIAGNOSIS — E119 Type 2 diabetes mellitus without complications: Secondary | ICD-10-CM | POA: Diagnosis not present

## 2019-01-30 DIAGNOSIS — Z794 Long term (current) use of insulin: Secondary | ICD-10-CM | POA: Diagnosis not present

## 2019-02-03 DIAGNOSIS — M5136 Other intervertebral disc degeneration, lumbar region: Secondary | ICD-10-CM | POA: Diagnosis not present

## 2019-02-03 DIAGNOSIS — M545 Low back pain: Secondary | ICD-10-CM | POA: Diagnosis not present

## 2019-02-09 DIAGNOSIS — M545 Low back pain: Secondary | ICD-10-CM | POA: Diagnosis not present

## 2019-02-09 DIAGNOSIS — M5136 Other intervertebral disc degeneration, lumbar region: Secondary | ICD-10-CM | POA: Diagnosis not present

## 2019-02-13 ENCOUNTER — Ambulatory Visit: Payer: Self-pay | Admitting: *Deleted

## 2019-02-13 ENCOUNTER — Telehealth: Payer: Self-pay

## 2019-02-13 NOTE — Telephone Encounter (Signed)
Called patient and she stated the area is in her cheek bone area and is squishy to touch and puffiness has been on both sides. Patient is waking up with pain in both areas and moves into top of her head. Patient is having headaches. Patient is also having vision changes.  I have made her an in office visit tomorrow at 4pm.  Copied from [place] [phone]. Topic: General - Other >> Feb 13, 2019  4:10 PM Leward Quan A wrote: Reason for CRM: Patient called to speak to [facility] about some sinus issues that she think she may have. She states that she have what looks like fluid under her eyes in her cheek bone area. Asking for a call back because she is concerned due to the pain that wakes her out of her sleep sometimes. Ph# [phone]

## 2019-02-13 NOTE — Telephone Encounter (Signed)
Message from Jodie Echevaria sent at 02/13/2019 4:14 PM EDT  Patient called to speak to a nurse about some sinus issues that she think she may have. She states that she have what looks like fluid under her eyes in her cheek bone area. Asking for a call back because she is concerned due to the pain that wakes her out of her sleep sometimes. Ph# [phone]    Returned call to patient regarding sinus issues that developed over the last few days. She has facial swelling that started on the light side about 2 to 3 weeks ago and now in the last few days she has swelling under her right eye. That is discolored (not red) and like fluid is in there. She has some soreness on the left side of her neck when she turns her head.  She also c/o that she will wake up in the middle of the night with a headache, on the top of her head.  She denies fever, cough, sore throat, earache or nasal congestion. She has a runny nose at times but it is clear. Advised that the practice will call her back in the morning for an appointment. Advised to monitor her temp and any new symptoms to discuss with the practice when they return her call. She voiced understanding. And also to use saline wash for nasal congestion and Tylenol for the pain.  Routing to LB at [place] for review and an appointment.   Reason for Disposition . [1] Sinus congestion (pressure, fullness) AND [2] present > 10 days  Answer Assessment - Initial Assessment Questions 1. LOCATION: "Where does it hurt?"      Right side near the eye 2. ONSET: "When did the sinus pain start?"  (e.g., hours, days)      With in the last few days 3. SEVERITY: "How bad is the pain?"   (Scale 1-10; mild, moderate or severe)   - MILD (1-3): doesn't interfere with normal activities    - MODERATE (4-7): interferes with normal activities (e.g., work or school) or awakens from sleep   - SEVERE (8-10): excruciating pain and patient unable to do any normal activities         mild 4. RECURRENT SYMPTOM: "Have you ever had sinus problems before?" If so, ask: "When was the last time?" and "What happened that time?"      Not like this 5. NASAL CONGESTION: "Is the nose blocked?" If so, ask, "Can you open it or must you breathe through the mouth?"     no 6. NASAL DISCHARGE: "Do you have discharge from your nose?" If so ask, "What color?"     A little and it is clear 7. FEVER: "Do you have a fever?" If so, ask: "What is it, how was it measured, and when did it start?"      No fever 8. OTHER SYMPTOMS: "Do you have any other symptoms?" (e.g., sore throat, cough, earache, difficulty breathing)     Headache, left side of neck hurts at times 9. PREGNANCY: "Is there any chance you are pregnant?" "When was your last menstrual period?"     n/a  Protocols used: SINUS PAIN OR CONGESTION-A-AH

## 2019-02-13 NOTE — Telephone Encounter (Signed)
Please call and schedule appointment.

## 2019-02-14 ENCOUNTER — Encounter: Payer: Self-pay | Admitting: Family Medicine

## 2019-02-14 ENCOUNTER — Other Ambulatory Visit: Payer: Self-pay

## 2019-02-14 ENCOUNTER — Ambulatory Visit (INDEPENDENT_AMBULATORY_CARE_PROVIDER_SITE_OTHER): Payer: Medicare Other | Admitting: Family Medicine

## 2019-02-14 VITALS — BP 136/88 | HR 108 | Temp 98.3°F | Ht 61.5 in | Wt 152.9 lb

## 2019-02-14 DIAGNOSIS — R51 Headache: Secondary | ICD-10-CM

## 2019-02-14 DIAGNOSIS — R519 Headache, unspecified: Secondary | ICD-10-CM

## 2019-02-14 DIAGNOSIS — R0981 Nasal congestion: Secondary | ICD-10-CM | POA: Diagnosis not present

## 2019-02-14 DIAGNOSIS — R220 Localized swelling, mass and lump, head: Secondary | ICD-10-CM

## 2019-02-14 MED ORDER — AMOXICILLIN-POT CLAVULANATE 875-125 MG PO TABS: 1.0000 | ORAL_TABLET | Freq: Two times a day (BID) | ORAL | 0 refills | Status: DC

## 2019-02-14 NOTE — Patient Instructions (Signed)
Follow up immediately for any acute vision changes such as blurred vision  Go ahead and start the Augmentin and take with food  Let me know in 10-14 days if not better.

## 2019-02-14 NOTE — Progress Notes (Signed)
Subjective:     Patient ID: Julie Norton, female   DOB: 1930/05/01, 85 y.o.   MRN: [id number]  HPI Patient called earlier with couple week history of some swelling around her maxillary region bilaterally.  She has noticed some soft tissue swelling under both eyes.  She initially reported some vision changes but actually had full ophthalmology assessment 2 weeks ago and has not had any blurred vision.  She has had some puffiness of the eyelids.  She is having some intermittent headaches which are somewhat frontal bilaterally and parietal.  No exertional headache.  No vomiting.  She has taken some Tylenol intermittently for headaches without much improvement.  She has had some minimal nasal congestion.  No bloody nasal discharge.  No purulent discharge.  No significant cough.  Past Medical History:  Diagnosis Date  . Abdominal adhesions   . Abdominal gas pain    suspected -- may be related to intermittent right upper quadrant discomfort radiating around her back      . Breast cancer ([place])   . Breast mass    left breast  . Cancer ([place])    left breast  . Chest pain    intermittent right upper quadrant discomfort radiating around her back      . Complication of anesthesia   . Diabetes mellitus without complication ([place])    Type II  . Dyspepsia   . GERD (gastroesophageal reflux disease)   . H/O: hysterectomy 1978  . Hypercholesteremia   . Hypothyroidism   . Personal history of radiation therapy   . Pneumonia   . PONV (postoperative nausea and vomiting)   . Tendinitis    of the right arm and shoulder  . Torn tendon    right shoulder   Past Surgical History:  Procedure Laterality Date  . ABDOMINAL HYSTERECTOMY    . APPENDECTOMY  1978  . BLADDER REPAIR    . BREAST LUMPECTOMY Left 02/24/2011   central partial mastectomy - dr Margot Chimes  . BREAST REDUCTION SURGERY    . CARDIOVERSION N/A 10/28/2017   Procedure: CARDIOVERSION;  Surgeon: Lelon Perla, MD;  Location: [facility] ENDOSCOPY;   Service: Cardiovascular;  Laterality: N/A;  . COLONOSCOPY WITH PROPOFOL N/A 03/24/2017   Procedure: COLONOSCOPY WITH PROPOFOL;  Surgeon: Wilford Corner, MD;  Location: [place];  Service: Endoscopy;  Laterality: N/A;  . ESOPHAGOGASTRODUODENOSCOPY (EGD) WITH PROPOFOL N/A 03/24/2017   Procedure: ESOPHAGOGASTRODUODENOSCOPY (EGD) WITH PROPOFOL;  Surgeon: Wilford Corner, MD;  Location: [place];  Service: Endoscopy;  Laterality: N/A;  . LIPOMA EXCISION Right   . REDUCTION MAMMAPLASTY Bilateral 1998  . RETINAL DETACHMENT SURGERY Left   . SALPINGOOPHORECTOMY    . TEE WITHOUT CARDIOVERSION N/A 10/28/2017   Procedure: TRANSESOPHAGEAL ECHOCARDIOGRAM (TEE);  Surgeon: Lelon Perla, MD;  Location: [facility] ENDOSCOPY;  Service: Cardiovascular;  Laterality: N/A;  . Start   at 85 years of age    reports that she has never smoked. She has never used smokeless tobacco. She reports that she does not drink alcohol or use drugs. family history includes Aortic aneurysm in her mother and sister; Cancer in her mother; Diabetes in her mother; Heart attack in her mother; Hypertension in her mother; Pneumonia in her father; Stroke in her mother. Allergies  Allergen Reactions  . Bactrim Nausea And Vomiting  . Dilaudid [Hydromorphone Hcl] Other (See Comments)    Unknown  . Hydrocodone Nausea And Vomiting  . Invokana [Canagliflozin] Itching and Other (See Comments)  Yeast Infections   . Macrodantin Nausea And Vomiting     Review of Systems  Constitutional: Negative for chills, fever and unexpected weight change.  HENT: Positive for congestion, facial swelling and sinus pressure. Negative for ear discharge, ear pain and sore throat.   Respiratory: Negative for cough and shortness of breath.   Cardiovascular: Negative for chest pain.  Neurological: Positive for headaches. Negative for dizziness, seizures and syncope.       Objective:   Physical Exam Constitutional:       General: She is not in acute distress.    Appearance: She is well-developed. She is not ill-appearing.  HENT:     Head:     Comments: She does have some nonspecific puffiness just below both lower lids.  No visible rash. Neck:     Musculoskeletal: Neck supple.  Cardiovascular:     Comments: Irregular rhythm consistent with atrial fibrillation Pulmonary:     Effort: Pulmonary effort is normal.     Breath sounds: Normal breath sounds.  Lymphadenopathy:     Cervical: No cervical adenopathy.  Neurological:     Mental Status: She is alert.     Cranial Nerves: No cranial nerve deficit or facial asymmetry.     Motor: No weakness.        Assessment:     Patient presents with a couple week history of some facial swelling and some sinus pressure maxillary and frontal.  No fever.  She is also describing headache which is not clear whether this is related.  Her headache is very intermittent.  Nonfocal exam neurologically.  We recommended the following    Plan:     -Treat empirically for possible sinusitis with Augmentin 875 mg twice daily with food.  Reviewed potential side effects. -Check sed rate given new onset headache in patient over 50 -Follow-up immediately for any blurred vision, fever, stiff neck, or any other changes -Consider CT head if not improving with the above  Eulas Post MD [facility] at [facility]

## 2019-02-14 NOTE — Telephone Encounter (Signed)
I have already scheduled her to come in at 4pm today.

## 2019-02-15 LAB — SEDIMENTATION RATE: Sed Rate: 11 mm/hr (ref 0–30)

## 2019-02-23 ENCOUNTER — Other Ambulatory Visit: Payer: Self-pay | Admitting: Family Medicine

## 2019-02-24 ENCOUNTER — Ambulatory Visit
Admission: RE | Admit: 2019-02-24 | Discharge: 2019-02-24 | Disposition: A | Discharge status: Home / Self Care | Payer: Medicare Other | Source: Ambulatory Visit | Attending: Obstetrics & Gynecology | Admitting: Obstetrics & Gynecology

## 2019-02-24 ENCOUNTER — Other Ambulatory Visit: Payer: Self-pay

## 2019-02-24 DIAGNOSIS — Z1231 Encounter for screening mammogram for malignant neoplasm of breast: Secondary | ICD-10-CM

## 2019-02-25 ENCOUNTER — Other Ambulatory Visit: Payer: Self-pay | Admitting: Family Medicine

## 2019-03-15 ENCOUNTER — Telehealth: Payer: Self-pay | Admitting: Cardiology

## 2019-03-15 NOTE — Telephone Encounter (Signed)
Patient advised to decrease salt.Wear compression stockings,elevate feet as much as she can.

## 2019-03-15 NOTE — Telephone Encounter (Signed)
I will forward to Dr.Jordans nurse for review

## 2019-03-15 NOTE — Telephone Encounter (Signed)
° ° °  Pt c/o swelling: STAT is pt has developed SOB within 24 hours  1) How much weight have you gained and in what time span?   2) If swelling, where is the swelling located? LEGS, ANKLES, FEET  3) Are you currently taking a fluid pill? YES  4) Are you currently SOB? NO  5) Do you have a log of your daily weights (if so, list)? 151, 156  6) Have you gained 3 pounds in a day or 5 pounds in a week?   7) Have you traveled recently? NO

## 2019-03-15 NOTE — Telephone Encounter (Signed)
Spoke to patient.She stated for the past 2 weeks she is having swelling in both lower legs and feet.Stated left foot worse red and painful.Stated both lower legs feel tight.Stated she has gained appox 3 to 4 lbs in 2 weeks.No sob.Advised ok to double lasix dose for 3 days only then return to normal dose.Appointment scheduled with Dr.Jordan 03/21/19 at 8:00 am.

## 2019-03-19 NOTE — Progress Notes (Signed)
Cardiology Office Note   Date:  03/21/2019   ID:  Julie Norton, DOB 09-13-1929, MRN [id number]  PCP:  Eulas Post, MD  Cardiologist:  Martinique MD  Chief Complaint  Patient presents with  . Leg Swelling  . Atrial Fibrillation      History of Present Illness: Julie Norton is a 85 y.o. female who presents for follow up of Atrial fibrillation.   She has a past history of hypercholesterolemia. She also has a history of DM on  insulin. Echo in 2015 showed mild MR with normal LV function. She had a normal Myoview study in November 2017.   She did undergo GI evaluation in September 2018  for iron deficiency anemia. She had a small hiatal hernia and diverticulosis. One small rectal polyp.   She was seen by Dr. Elease Hashimoto on 09/20/17. Found to be in Afib with rate 95. Started on Eliquis. Follow up Echo showed mild biatrial enlargement and normal LV function. She  noted some fatigue, decreased energy and leg swelling. Some dyspnea and chest tightness. Was started on lasix with improvement of these symptoms. She subsequently underwent TEE guided DCCV on 10/28/17 with return to NSR.   On follow up today she notes increased leg swelling for 2 months. Over the last 4 days it has been much worse. She double up on her lasix for 3 days and this helped but still swelling. She is a little SOB going up steps. No pain. Thinks she has had some AFib off and on over the summer but it would go away.     Past Medical History:  Diagnosis Date  . Abdominal adhesions   . Abdominal gas pain    suspected -- may be related to intermittent right upper quadrant discomfort radiating around her back      . Breast cancer ([place])   . Breast mass    left breast  . Cancer ([place])    left breast  . Chest pain    intermittent right upper quadrant discomfort radiating around her back      . Complication of anesthesia   . Diabetes mellitus without complication ([place])    Type II  . Dyspepsia   . GERD  (gastroesophageal reflux disease)   . H/O: hysterectomy 1978  . Hypercholesteremia   . Hypothyroidism   . Personal history of radiation therapy   . Pneumonia   . PONV (postoperative nausea and vomiting)   . Tendinitis    of the right arm and shoulder  . Torn tendon    right shoulder    Past Surgical History:  Procedure Laterality Date  . ABDOMINAL HYSTERECTOMY    . APPENDECTOMY  1978  . BLADDER REPAIR    . BREAST LUMPECTOMY Left 02/24/2011   central partial mastectomy - dr Margot Chimes  . BREAST REDUCTION SURGERY    . CARDIOVERSION N/A 10/28/2017   Procedure: CARDIOVERSION;  Surgeon: Lelon Perla, MD;  Location: [facility] ENDOSCOPY;  Service: Cardiovascular;  Laterality: N/A;  . COLONOSCOPY WITH PROPOFOL N/A 03/24/2017   Procedure: COLONOSCOPY WITH PROPOFOL;  Surgeon: Wilford Corner, MD;  Location: [place];  Service: Endoscopy;  Laterality: N/A;  . ESOPHAGOGASTRODUODENOSCOPY (EGD) WITH PROPOFOL N/A 03/24/2017   Procedure: ESOPHAGOGASTRODUODENOSCOPY (EGD) WITH PROPOFOL;  Surgeon: Wilford Corner, MD;  Location: [place];  Service: Endoscopy;  Laterality: N/A;  . LIPOMA EXCISION Right   . REDUCTION MAMMAPLASTY Bilateral 1998  . RETINAL DETACHMENT SURGERY Left   . SALPINGOOPHORECTOMY    . TEE  WITHOUT CARDIOVERSION N/A 10/28/2017   Procedure: TRANSESOPHAGEAL ECHOCARDIOGRAM (TEE);  Surgeon: Lelon Perla, MD;  Location: [facility] ENDOSCOPY;  Service: Cardiovascular;  Laterality: N/A;  . TONSILLECTOMY AND ADENOIDECTOMY  1950   at 85 years of age     Current Outpatient Medications  Medication Sig Dispense Refill  . atorvastatin (LIPITOR) 10 MG tablet TAKE ONE-HALF TABLET BY  MOUTH EVERY OTHER DAY OR AS DIRECTED 45 tablet 1  . Calcium Carbonate-Vitamin D (CALCIUM 600+D) 600-200 MG-UNIT TABS Take 1 tablet by mouth 3 (three) times a week.     . Cholecalciferol (VITAMIN D PO) Take 1 tablet by mouth daily.     Marland Kitchen ELIQUIS 5 MG TABS tablet TAKE 1 TABLET BY MOUTH TWICE A DAY 180 tablet 1  .  furosemide (LASIX) 20 MG tablet Take 1 tablet (20 mg total) by mouth daily. (Patient taking differently: Take 20 mg by mouth as needed. ) 30 tablet 6  . glucosamine-chondroitin 500-400 MG tablet Take 1 tablet by mouth 3 (three) times a week.     Marland Kitchen glucose blood (ONE TOUCH ULTRA TEST) test strip Check once daily. E11.40 100 each 11  . Insulin Pen Needle 29G X 5MM MISC Use once daily 100 each 3  . levothyroxine (SYNTHROID, LEVOTHROID) 50 MCG tablet TAKE 1 TABLET BY MOUTH  DAILY 90 tablet 1  . metFORMIN (GLUCOPHAGE-XR) 500 MG 24 hr tablet TAKE 1 TABLET BY MOUTH  TWICE A DAY 180 tablet 1  . omeprazole (PRILOSEC) 20 MG capsule TAKE 1 CAPSULE BY MOUTH TWO TIMES DAILY 180 capsule 3  . ONE TOUCH ULTRA TEST test strip CHECK ONCE A DAY 50 each 3  . traZODone (DESYREL) 50 MG tablet     . TRESIBA FLEXTOUCH 100 UNIT/ML SOPN FlexTouch Pen INJECT 24 UNITS TOTAL INTO THE SKIN DAILY AT 10PM 15 pen 1   No current facility-administered medications for this visit.     Allergies:   Bactrim, Dilaudid [hydromorphone hcl], Hydrocodone, Invokana [canagliflozin], and Macrodantin    Social History:  The patient  reports that she has never smoked. She has never used smokeless tobacco. She reports that she does not drink alcohol or use drugs.   Family History:  The patient's family history includes Aortic aneurysm in her mother and sister; Cancer in her mother; Diabetes in her mother; Heart attack in her mother; Hypertension in her mother; Pneumonia in her father; Stroke in her mother.    ROS:  Please see the history of present illness.   Otherwise, review of systems are positive for none.   All other systems are reviewed and negative.    PHYSICAL EXAM: VS:  BP (!) 144/86   Pulse (!) 110   Ht 5' 1.5" (1.562 m)   Wt 149 lb 9.6 oz (67.9 kg)   SpO2 97%   BMI 27.81 kg/m  , BMI Body mass index is 27.81 kg/m. GENERAL:  Well appearing WF  In NAD HEENT:  PERRL, EOMI, sclera are clear. Oropharynx is clear. NECK:  No  jugular venous distention, carotid upstroke brisk and symmetric, no bruits, no thyromegaly or adenopathy LUNGS:  Clear to auscultation bilaterally CHEST:  Unremarkable HEART:  IRRR,  PMI not displaced or sustained,S1 and S2 within normal limits, no S3, no S4: no clicks, no rubs, no murmurs ABD:  Soft, nontender. BS +, no masses or bruits. No hepatomegaly, no splenomegaly EXT:  2 + pulses throughout, 1-2+ edema, no cyanosis no clubbing SKIN:  Warm and dry.  No rashes NEURO:  Alert and oriented x 3. Cranial nerves II through XII intact. PSYCH:  Cognitively intact  EKG:  EKG is ordered today. Afib with rate 105.   RBBB. I have personally reviewed and interpreted this study.  Recent Labs: 03/28/2018: ALT 13 12/21/2018: BUN 16; Creatinine, Ser 0.74; Hemoglobin 10.3; Platelets 170.0; Potassium 4.0; Sodium 142; TSH 3.03    Lipid Panel    Component Value Date/Time   CHOL 152 03/28/2018 1015   CHOL 135 04/26/2017 0840   TRIG 123.0 03/28/2018 1015   HDL 45.10 03/28/2018 1015   HDL 45 04/26/2017 0840   CHOLHDL 3 03/28/2018 1015   VLDL 24.6 03/28/2018 1015   LDLCALC 82 03/28/2018 1015   LDLCALC 70 04/26/2017 0840      Wt Readings from Last 3 Encounters:  03/21/19 149 lb 9.6 oz (67.9 kg)  02/14/19 152 lb 14.4 oz (69.4 kg)  12/21/18 150 lb (68 kg)      Lexiscan myoview 05/26/16:Study Highlights    Nuclear stress EF: 72%.  There was no ST segment deviation noted during stress.  This is a low risk study.  The left ventricular ejection fraction is hyperdynamic (>65%).   1. Small, mild mid-anteroseptal perfusion defect.  This defect was actually more intense at rest than with stress.  No ischemia.  Suspect attenuation.  2. EF 72% with normal wall motion.  3. Overall low risk study.     Echo 09/24/17: Study Conclusions  - Left ventricle: The cavity size was normal. Systolic function was   normal. The estimated ejection fraction was in the range of 55%   to 60%. Wall motion was  normal; there were no regional wall   motion abnormalities. - Mitral valve: There was mild regurgitation. - Left atrium: The atrium was mildly dilated. - Right atrium: The atrium was mildly dilated. - Atrial septum: No defect or patent foramen ovale was identified. - Pulmonary arteries: Systolic pressure was mildly increased. PA   peak pressure: 37 mm Hg (S).    ASSESSMENT AND PLAN:  1. Atrial fibrillation. Recurrent and persistent. [place] vasc score of 4. On Eliquis. On no rate slowing medication. Thinks she may have missed one or two doses of Eliquis but otherwise compliant.  S/p TEE guided DCCV in April 2019. I have recommended adding Toprol XL 25 mg daily for rate control. Reinforced importance of taking Eliquis without missing doses. Will start Flecainide 50 mg daily. Follow up in 2 weeks. If still in Afib will arrange DCCV.   2.  Hypercholesterolemia-  She is at goal. Continue low dose lipitor.   3. Acute diastolic CHF. More edema since in Afib. Will increase lasix for 3 days then 20 mg daily. Salt restriction.   4. Diabetes mellitus- now on insulin.    5. History of Breast CA.   Follow up in 6 months  Signed,  Martinique MD, [facility]   03/21/2019 8:15 AM    [place]

## 2019-03-21 ENCOUNTER — Other Ambulatory Visit: Payer: Self-pay

## 2019-03-21 ENCOUNTER — Ambulatory Visit (INDEPENDENT_AMBULATORY_CARE_PROVIDER_SITE_OTHER): Payer: Medicare Other | Admitting: Cardiology

## 2019-03-21 ENCOUNTER — Encounter: Payer: Self-pay | Admitting: Cardiology

## 2019-03-21 VITALS — BP 144/86 | HR 110 | Ht 61.5 in | Wt 149.6 lb

## 2019-03-21 DIAGNOSIS — I4510 Unspecified right bundle-branch block: Secondary | ICD-10-CM

## 2019-03-21 DIAGNOSIS — I5031 Acute diastolic (congestive) heart failure: Secondary | ICD-10-CM | POA: Diagnosis not present

## 2019-03-21 DIAGNOSIS — E7800 Pure hypercholesterolemia, unspecified: Secondary | ICD-10-CM | POA: Diagnosis not present

## 2019-03-21 DIAGNOSIS — I4819 Other persistent atrial fibrillation: Secondary | ICD-10-CM | POA: Diagnosis not present

## 2019-03-21 MED ORDER — METOPROLOL SUCCINATE ER 25 MG PO TB24: 25.0000 mg | ORAL_TABLET | Freq: Every day | ORAL | 3 refills | Status: DC

## 2019-03-21 MED ORDER — FLECAINIDE ACETATE 50 MG PO TABS: 50.0000 mg | ORAL_TABLET | Freq: Two times a day (BID) | ORAL | 3 refills | Status: DC

## 2019-03-21 NOTE — Addendum Note (Signed)
Addended by: Kathyrn Lass on: 03/21/2019 08:41 AM   Modules accepted: Orders

## 2019-03-21 NOTE — Patient Instructions (Signed)
Make sure you take your Eliquis twice a day. Don't miss any doses  Take lasix 40 mg daily for 3 days then 20 mg daily  We will add Toprol XL 25 mg daily to slow your heart rate  Add Flecainide 50 mg twice a day for your rhythm  We will arrange follow up in 3 weeks to see if you are still out of rhythm

## 2019-03-24 ENCOUNTER — Telehealth: Payer: Self-pay | Admitting: Cardiology

## 2019-03-24 NOTE — Telephone Encounter (Signed)
Spoke to patient she stated since she started taking metoprolol and flecainide she has been tired,nausea, diarrhea.Stated she has felt so bad she has to lay down in bed for a few hours each day.Today her B/P 124/83 pulse 65.Spoke to Dr.Jordan he advised to stop taking flecainide.Advised to keep appointment already scheduled 04/03/19 at 9:00 am.Advised to call sooner if needed.

## 2019-03-24 NOTE — Telephone Encounter (Signed)
Pt c/o medication issue:  1. Name of Medication:   Metoprolol and Flecainide  2. How are you currently taking this medication (dosage and times per day)?  Metoprolol 1 time a day- Flecainide 2 times a day   3. Are you having a reaction (difficulty breathing--STAT)?  no  4. What is your medication issue? Nauseated, real weak

## 2019-03-27 ENCOUNTER — Emergency Department (HOSPITAL_COMMUNITY)
Admission: EM | Admit: 2019-03-27 | Discharge: 2019-03-28 | Disposition: A | Discharge status: Home / Self Care | Payer: Medicare Other | Attending: Emergency Medicine | Admitting: Emergency Medicine

## 2019-03-27 ENCOUNTER — Ambulatory Visit: Payer: Self-pay | Admitting: *Deleted

## 2019-03-27 ENCOUNTER — Encounter (HOSPITAL_COMMUNITY): Payer: Self-pay

## 2019-03-27 ENCOUNTER — Other Ambulatory Visit: Payer: Self-pay

## 2019-03-27 ENCOUNTER — Emergency Department (HOSPITAL_COMMUNITY): Payer: Medicare Other

## 2019-03-27 DIAGNOSIS — R112 Nausea with vomiting, unspecified: Secondary | ICD-10-CM | POA: Diagnosis not present

## 2019-03-27 DIAGNOSIS — E119 Type 2 diabetes mellitus without complications: Secondary | ICD-10-CM | POA: Insufficient documentation

## 2019-03-27 DIAGNOSIS — Z853 Personal history of malignant neoplasm of breast: Secondary | ICD-10-CM | POA: Diagnosis not present

## 2019-03-27 DIAGNOSIS — E039 Hypothyroidism, unspecified: Secondary | ICD-10-CM | POA: Diagnosis not present

## 2019-03-27 DIAGNOSIS — R109 Unspecified abdominal pain: Secondary | ICD-10-CM | POA: Diagnosis present

## 2019-03-27 DIAGNOSIS — Z87891 Personal history of nicotine dependence: Secondary | ICD-10-CM | POA: Insufficient documentation

## 2019-03-27 DIAGNOSIS — R1013 Epigastric pain: Secondary | ICD-10-CM | POA: Diagnosis not present

## 2019-03-27 DIAGNOSIS — R079 Chest pain, unspecified: Secondary | ICD-10-CM | POA: Insufficient documentation

## 2019-03-27 DIAGNOSIS — Z79899 Other long term (current) drug therapy: Secondary | ICD-10-CM | POA: Diagnosis not present

## 2019-03-27 DIAGNOSIS — Z7901 Long term (current) use of anticoagulants: Secondary | ICD-10-CM | POA: Diagnosis not present

## 2019-03-27 DIAGNOSIS — Z7984 Long term (current) use of oral hypoglycemic drugs: Secondary | ICD-10-CM | POA: Diagnosis not present

## 2019-03-27 DIAGNOSIS — R1110 Vomiting, unspecified: Secondary | ICD-10-CM | POA: Diagnosis not present

## 2019-03-27 LAB — LIPASE, BLOOD: Lipase: 85 U/L — ABNORMAL HIGH (ref 11–51)

## 2019-03-27 LAB — COMPREHENSIVE METABOLIC PANEL
ALT: 18 U/L (ref 0–44)
AST: 23 U/L (ref 15–41)
Albumin: 3.8 g/dL (ref 3.5–5.0)
Alkaline Phosphatase: 61 U/L (ref 38–126)
Anion gap: 10 (ref 5–15)
BUN: 11 mg/dL (ref 8–23)
CO2: 21 mmol/L — ABNORMAL LOW (ref 22–32)
Calcium: 9.3 mg/dL (ref 8.9–10.3)
Chloride: 106 mmol/L (ref 98–111)
Creatinine, Ser: 0.97 mg/dL (ref 0.44–1.00)
GFR calc Af Amer: >60 mL/min (ref >60)
GFR calc non Af Amer: 53 mL/min — ABNORMAL LOW (ref >60)
Glucose, Bld: 183 mg/dL — ABNORMAL HIGH (ref 70–99)
Potassium: 4.3 mmol/L (ref 3.5–5.1)
Sodium: 137 mmol/L (ref 135–145)
Total Bilirubin: 0.7 mg/dL (ref 0.3–1.2)
Total Protein: 6.8 g/dL (ref 6.5–8.1)

## 2019-03-27 LAB — CBC
HCT: 33.1 % — ABNORMAL LOW (ref 36.0–46.0)
Hemoglobin: 9.9 g/dL — ABNORMAL LOW (ref 12.0–15.0)
MCH: 23.1 pg — ABNORMAL LOW (ref 26.0–34.0)
MCHC: 29.9 g/dL — ABNORMAL LOW (ref 30.0–36.0)
MCV: 77.3 fL — ABNORMAL LOW (ref 80.0–100.0)
Platelets: 181 10*3/uL (ref 150–400)
RBC: 4.28 MIL/uL (ref 3.87–5.11)
RDW: 15.9 % — ABNORMAL HIGH (ref 11.5–15.5)
WBC: 7.6 10*3/uL (ref 4.0–10.5)
nRBC: 0 % (ref 0.0–0.2)

## 2019-03-27 LAB — TROPONIN I (HIGH SENSITIVITY): Troponin I (High Sensitivity): 15 ng/L (ref <18)

## 2019-03-27 MED ORDER — SODIUM CHLORIDE 0.9% FLUSH
3.0000 mL | Freq: Once | INTRAVENOUS | Status: AC
  Administered 2019-03-28: 05:00:00 3 mL via INTRAVENOUS

## 2019-03-27 NOTE — Telephone Encounter (Signed)
Returned call to patient of Dr. Martinique.  Patient reports she is burping a lot, was up a lot last night, side is hurting her. "Everything in the chest area is kind of hurting". She reports gallbladder issues run in her family. Patient report no improvement in her symptoms apart from decreasing nausea and diarrhea has stopped since stopping flecainide last week. She is to have labs at PCP tomorrow - advised she call them and make them aware of symptoms, could possibly have additional labs (LFT, CMET, CBC)

## 2019-03-27 NOTE — ED Triage Notes (Signed)
Pt reports right sided abd pain since last night that has now radiated to her mid chest. Pt seen by her cardiologist who made some medication changes for her a fib, pt also taking eliquis. Nausea but no vomiting. Pt a.o, nad noted.

## 2019-03-27 NOTE — Telephone Encounter (Signed)
  Reason for Disposition . [1] Chest pain (or "angina") comes and goes AND [2] is happening more often (increasing in frequency) or getting worse (increasing in severity)  Answer Assessment - Initial Assessment Questions 1. LOCATION: "Where does it hurt?"       Center of chest right below breasts 2. RADIATION: "Does the pain go anywhere else?" (e.g., into neck, jaw, arms, back)     Upper back, right shoulder blade last night 3. ONSET: "When did the chest pain begin?" (Minutes, hours or days)      Started yesterday morning after eating, got worse last night around 7pm 4. PATTERN "Does the pain come and go, or has it been constant since it started?"  "Does it get worse with exertion?"      Gets worse with eating  5. DURATION: "How long does it last" (e.g., seconds, minutes, hours)     Intermittent since last night  6. SEVERITY: "How bad is the pain?"  (e.g., Scale 1-10; mild, moderate, or severe)    - MILD (1-3): doesn't interfere with normal activities     - MODERATE (4-7): interferes with normal activities or awakens from sleep    - SEVERE (8-10): excruciating pain, unable to do any normal activities      6-7/10, feels like soreness now 7. CARDIAC RISK FACTORS: "Do you have any history of heart problems or risk factors for heart disease?" (e.g., angina, prior heart attack; diabetes, high blood pressure, high cholesterol, smoker, or strong family history of heart disease)     Atrial fibrilation, diabetes  8. PULMONARY RISK FACTORS: "Do you have any history of lung disease?"  (e.g., blood clots in lung, asthma, emphysema, birth control pills)     No history of breathing problems  9. CAUSE: "What do you think is causing the chest pain?"     Unsure  10. OTHER SYMPTOMS: "Do you have any other symptoms?" (e.g., dizziness, nausea, vomiting, sweating, fever, difficulty breathing, cough)       Nausea  11. PREGNANCY: "Is there any chance you are pregnant?" "When was your last menstrual period?"   N/A  Protocols used: CHEST PAIN-A-AH   Patient states she started having discomfort in the center of her chest yesterday morning after eating breakfast.  The pain has been intermittent since then, but only completely subsiding for periods of several minutes before returning.  She states pain is located in the center of her chest right below her breasts.  She also states that she had upper back and pain under her right shoulder blade, and in right rib cage area last night.  Pain in these areas has improved.  She also notes that she has had indigestion with belching.  She notes that pain has been made worse by eating, and she has experienced some nausea as well.  She denies shortness of breath.  Given that patient's pain is intermittent, she rates as moderate discomfort and is persisting, and her history of atrial fibrillation and diabetes recommended patient have her husband take her to the ER at [facility] or [facility] now.  Advised if he is unavailable to take her she should call 911.  Patient expressed understanding and is agreeable with plan.

## 2019-03-27 NOTE — Telephone Encounter (Signed)
Will monitor for ED arrival.  

## 2019-03-27 NOTE — Telephone Encounter (Addendum)
° °  Patient states she still  feels bad. Patient states she is going to see PCP on 9/22. Gallbladder concerns.  BP 117/6? HR 60's Requesting call from nurse

## 2019-03-28 ENCOUNTER — Emergency Department (HOSPITAL_COMMUNITY): Payer: Medicare Other

## 2019-03-28 ENCOUNTER — Other Ambulatory Visit: Payer: Medicare Other

## 2019-03-28 DIAGNOSIS — R1110 Vomiting, unspecified: Secondary | ICD-10-CM | POA: Diagnosis not present

## 2019-03-28 DIAGNOSIS — R112 Nausea with vomiting, unspecified: Secondary | ICD-10-CM | POA: Diagnosis not present

## 2019-03-28 LAB — URINALYSIS, ROUTINE W REFLEX MICROSCOPIC
Bilirubin Urine: NEGATIVE
Glucose, UA: NEGATIVE mg/dL
Hgb urine dipstick: NEGATIVE
Ketones, ur: NEGATIVE mg/dL
Leukocytes,Ua: NEGATIVE
Nitrite: NEGATIVE
Protein, ur: NEGATIVE mg/dL
Specific Gravity, Urine: >1.046 — ABNORMAL HIGH (ref 1.005–1.030)
pH: 6 (ref 5.0–8.0)

## 2019-03-28 LAB — CBG MONITORING, ED: Glucose-Capillary: 121 mg/dL — ABNORMAL HIGH (ref 70–99)

## 2019-03-28 MED ORDER — IOHEXOL 300 MG/ML  SOLN
100.0000 mL | Freq: Once | INTRAMUSCULAR | Status: AC | PRN
  Administered 2019-03-28: 06:00:00 100 mL via INTRAVENOUS

## 2019-03-28 MED ORDER — ALUM & MAG HYDROXIDE-SIMETH 200-200-20 MG/5ML PO SUSP
30.0000 mL | Freq: Once | ORAL | Status: AC
  Administered 2019-03-28: 05:00:00 30 mL via ORAL
  Filled 2019-03-28: qty 30

## 2019-03-28 NOTE — ED Notes (Signed)
Patient transported to CT 

## 2019-03-28 NOTE — Discharge Instructions (Addendum)

## 2019-03-28 NOTE — ED Notes (Signed)
Pt verbalized understanding of d/c instructions and has no further questions, VSS, NAD.  

## 2019-03-28 NOTE — ED Provider Notes (Signed)
[facility] EMERGENCY DEPARTMENT Provider Note  CSN: [id number] Arrival date & time: 03/27/19 1856  Chief Complaint(s) Abdominal Pain and Chest Pain  HPI Julie Norton is a 85 y.o. female   The history is provided by the patient.  Abdominal Pain Pain location:  Epigastric Pain quality: aching   Pain radiates to:  Chest and LUQ Pain severity:  Moderate Onset quality:  Gradual Duration:  2 days Timing:  Constant Progression:  Improving Chronicity:  Recurrent Context: not alcohol use, not eating, not previous surgeries, not sick contacts and not suspicious food intake   Relieved by:  Nothing Worsened by:  Nothing Ineffective treatments:  None tried Associated symptoms: chest pain and nausea   Associated symptoms: no chills, no constipation, no diarrhea, no fatigue, no fever, no shortness of breath and no vomiting   Chest Pain Associated symptoms: abdominal pain and nausea   Associated symptoms: no fatigue, no fever, no shortness of breath and no vomiting     Past Medical History Past Medical History:  Diagnosis Date   Abdominal adhesions    Abdominal gas pain    suspected -- may be related to intermittent right upper quadrant discomfort radiating around her back       Breast cancer (HCC)    Breast mass    left breast   Cancer ([place])    left breast   Chest pain    intermittent right upper quadrant discomfort radiating around her back       Complication of anesthesia    Diabetes mellitus without complication (HCC)    Type II   Dyspepsia    GERD (gastroesophageal reflux disease)    H/O: hysterectomy 1978   Hypercholesteremia    Hypothyroidism    Personal history of radiation therapy    Pneumonia    PONV (postoperative nausea and vomiting)    Tendinitis    of the right arm and shoulder   Torn tendon    right shoulder   Patient Active Problem List   Diagnosis Date Noted   GERD (gastroesophageal reflux disease) 11/30/2017     Persistent atrial fibrillation    Injury of muscle of rotator cuff 10/12/2017   Iron deficiency anemia, unspecified 03/24/2017   Poorly controlled type 2 diabetes mellitus ([place]) 02/23/2017   Mitral insufficiency 02/04/2016   Right bundle branch block 05/16/2015   Chest pain of unknown etiology 05/16/2015   Peripheral neuropathy 08/23/2014   Heart murmur previously undiagnosed 01/22/2014   Abdominal pain 11/11/2011   Ductal carcinoma in situ (DCIS) of left breast 01/12/2011   Hypercholesterolemia 11/28/2010   Hypothyroidism 11/28/2010   Postmenopausal state 11/28/2010   Osteoarthritis 11/28/2010   Dyspepsia 11/28/2010   Home Medication(s) Prior to Admission medications   Medication Sig Start Date End Date Taking? Authorizing Provider  atorvastatin (LIPITOR) 10 MG tablet TAKE ONE-HALF TABLET BY  MOUTH EVERY OTHER DAY OR AS DIRECTED Patient taking differently: Take 5 mg by mouth every other day.  08/09/18  Yes Burchette, Alinda Sierras, MD  ELIQUIS 5 MG TABS tablet TAKE 1 TABLET BY MOUTH TWICE A DAY Patient taking differently: Take 5 mg by mouth 2 (two) times daily.  02/27/19  Yes Burchette, Alinda Sierras, MD  furosemide (LASIX) 20 MG tablet Take 1 tablet (20 mg total) by mouth daily. Patient taking differently: Take 20 mg by mouth daily as needed for fluid.  09/27/17 03/27/28 Yes Martinique, Peter M, MD  levothyroxine (SYNTHROID, LEVOTHROID) 50 MCG tablet TAKE 1 TABLET BY MOUTH  DAILY Patient taking differently: Take 50 mcg by mouth daily before breakfast.  09/22/18  Yes Burchette, Alinda Sierras, MD  metFORMIN (GLUCOPHAGE-XR) 500 MG 24 hr tablet TAKE 1 TABLET BY MOUTH  TWICE A DAY Patient taking differently: Take 500 mg by mouth 2 (two) times daily.  11/25/18  Yes Burchette, Alinda Sierras, MD  metoprolol succinate (TOPROL XL) 25 MG 24 hr tablet Take 1 tablet (25 mg total) by mouth daily. 03/21/19  Yes Martinique, Peter M, MD  omeprazole (PRILOSEC) 20 MG capsule TAKE 1 CAPSULE BY MOUTH TWO TIMES DAILY Patient  taking differently: Take 20 mg by mouth 2 (two) times daily before a meal.  09/22/18  Yes Burchette, Alinda Sierras, MD  TRESIBA FLEXTOUCH 100 UNIT/ML SOPN FlexTouch Pen INJECT 24 UNITS TOTAL INTO THE SKIN DAILY AT 10PM Patient taking differently: Inject 20 Units into the skin at bedtime.  11/15/18  Yes Burchette, Alinda Sierras, MD  vitamin B-12 (CYANOCOBALAMIN) 100 MCG tablet Take 100 mcg by mouth daily.   Yes [provider]  glucose blood (ONE TOUCH ULTRA TEST) test strip Check once daily. E11.40 03/20/15   Burchette, Alinda Sierras, MD  Insulin Pen Needle 29G X 5MM MISC Use once daily 04/07/17   Burchette, Alinda Sierras, MD  ONE TOUCH ULTRA TEST test strip CHECK ONCE A DAY 02/26/14   Eulas Post, MD                                                                                                                                    Past Surgical History Past Surgical History:  Procedure Laterality Date   ABDOMINAL HYSTERECTOMY     APPENDECTOMY  1978   BLADDER REPAIR     BREAST LUMPECTOMY Left 02/24/2011   central partial mastectomy - dr Margot Chimes   BREAST REDUCTION SURGERY     CARDIOVERSION N/A 10/28/2017   Procedure: CARDIOVERSION;  Surgeon: Lelon Perla, MD;  Location: [facility];  Service: Cardiovascular;  Laterality: N/A;   COLONOSCOPY WITH PROPOFOL N/A 03/24/2017   Procedure: COLONOSCOPY WITH PROPOFOL;  Surgeon: Wilford Corner, MD;  Location: [facility] ENDOSCOPY;  Service: Endoscopy;  Laterality: N/A;   ESOPHAGOGASTRODUODENOSCOPY (EGD) WITH PROPOFOL N/A 03/24/2017   Procedure: ESOPHAGOGASTRODUODENOSCOPY (EGD) WITH PROPOFOL;  Surgeon: Wilford Corner, MD;  Location: [facility];  Service: Endoscopy;  Laterality: N/A;   LIPOMA EXCISION Right    REDUCTION MAMMAPLASTY Bilateral 1998   RETINAL DETACHMENT SURGERY Left    SALPINGOOPHORECTOMY     TEE WITHOUT CARDIOVERSION N/A 10/28/2017   Procedure: TRANSESOPHAGEAL ECHOCARDIOGRAM (TEE);  Surgeon: Lelon Perla, MD;  Location: [facility] ENDOSCOPY;   Service: Cardiovascular;  Laterality: N/A;   [place]   at 85 years of age   Family History Family History  Problem Relation Age of Onset   Pneumonia Father    Heart attack Mother    Hypertension Mother    Stroke Mother    Diabetes Mother  Aortic aneurysm Mother        abdominal aortic aneurysm   Cancer Mother        bladder   Aortic aneurysm Sister        abdominal aortic aneurysm    Social History Social History   Tobacco Use   Smoking status: Never Smoker   Smokeless tobacco: Never Used  Substance Use Topics   Alcohol use: No   Drug use: No   Allergies Bactrim, Dilaudid [hydromorphone hcl], Hydrocodone, Invokana [canagliflozin], and Macrodantin  Review of Systems Review of Systems  Constitutional: Negative for chills, fatigue and fever.  Respiratory: Negative for shortness of breath.   Cardiovascular: Positive for chest pain.  Gastrointestinal: Positive for abdominal pain and nausea. Negative for constipation, diarrhea and vomiting.   All other systems are reviewed and are negative for acute change except as noted in the HPI  Physical Exam Vital Signs  I have reviewed the triage vital signs BP (!) 134/99    Pulse 89    Temp (!) 97.3 F (36.3 C) (Oral)    Resp 14    Ht 5' 1.5" (1.562 m)    Wt 68 kg    SpO2 93%    BMI 27.88 kg/m   Physical Exam Vitals signs reviewed.  Constitutional:      General: She is not in acute distress.    Appearance: She is well-developed. She is not diaphoretic.  HENT:     Head: Normocephalic and atraumatic.     Right Ear: External ear normal.     Left Ear: External ear normal.     Nose: Nose normal.  Eyes:     General: No scleral icterus.    Conjunctiva/sclera: Conjunctivae normal.  Neck:     Musculoskeletal: Normal range of motion.     Trachea: Phonation normal.  Cardiovascular:     Rate and Rhythm: Normal rate and regular rhythm.  Pulmonary:     Effort: Pulmonary effort is  normal. No respiratory distress.     Breath sounds: No stridor.  Abdominal:     General: There is no distension.     Tenderness: There is abdominal tenderness (mild discomfort) in the epigastric area and left upper quadrant. There is no guarding or rebound. Negative signs include Murphy's sign.  Musculoskeletal: Normal range of motion.  Neurological:     Mental Status: She is alert and oriented to person, place, and time.  Psychiatric:        Behavior: Behavior normal.     ED Results and Treatments Labs (all labs ordered are listed, but only abnormal results are displayed) Labs Reviewed  CBC - Abnormal; Notable for the following components:      Result Value   Hemoglobin 9.9 (*)    HCT 33.1 (*)    MCV 77.3 (*)    MCH 23.1 (*)    MCHC 29.9 (*)    RDW 15.9 (*)    All other components within normal limits  COMPREHENSIVE METABOLIC PANEL - Abnormal; Notable for the following components:   CO2 21 (*)    Glucose, Bld 183 (*)    GFR calc non Af Amer 53 (*)    All other components within normal limits  URINALYSIS, ROUTINE W REFLEX MICROSCOPIC - Abnormal; Notable for the following components:   Specific Gravity, Urine >1.046 (*)    All other components within normal limits  LIPASE, BLOOD - Abnormal; Notable for the following components:   Lipase 85 (*)    All other  components within normal limits  TROPONIN I (HIGH SENSITIVITY)  TROPONIN I (HIGH SENSITIVITY)                                                                                                                         EKG  EKG Interpretation  Date/Time:  Monday March 27 2019 19:03:28 EDT Ventricular Rate:  80 PR Interval:    QRS Duration: 124 QT Interval:  406 QTC Calculation: 468 R Axis:   59 Text Interpretation:  Atrial fibrillation Right bundle branch block T wave abnormality, consider inferior ischemia or digitalis effect Abnormal ECG Confirmed by Addison Lank [phone]) on 03/28/2019 4:17:50 AM       Radiology Dg Chest 2 View  Result Date: 03/27/2019 CLINICAL DATA:  85 year old female with chest pain. EXAM: CHEST - 2 VIEW COMPARISON:  Chest radiograph dated 09/12/2018 FINDINGS: The lungs are clear. There is no pleural effusion or pneumothorax. Stable mild cardiomegaly. Atherosclerotic calcification of the aorta. No acute osseous pathology. IMPRESSION: No active cardiopulmonary disease. Electronically Signed   By: Anner Crete M.D.   On: 03/27/2019 20:49   Ct Abdomen Pelvis W Contrast  Result Date: 03/28/2019 CLINICAL DATA:  Pancreatitis suspected. Atypical labs/presentation. Right-sided abdominal pain since last night radiating to the mid chest with nausea and vomiting. EXAM: CT ABDOMEN AND PELVIS WITH CONTRAST TECHNIQUE: Multidetector CT imaging of the abdomen and pelvis was performed using the standard protocol following bolus administration of intravenous contrast. CONTRAST:  14mL OMNIPAQUE IOHEXOL 300 MG/ML  SOLN COMPARISON:  02/22/2017 FINDINGS: Lower chest:  No contributory findings. Hepatobiliary: No focal liver abnormality.No evidence of biliary obstruction or stone. Pancreas: Unremarkable. Spleen: Unremarkable. Adrenals/Urinary Tract: Negative adrenals. No hydronephrosis or stone. Unremarkable bladder. Stomach/Bowel:  No obstruction. No appendicitis. Vascular/Lymphatic: No acute vascular abnormality. Splenorenal shunt also seen on prior. No evident underlying cause. Atherosclerosis is mild, especially for age. No mass or adenopathy. Reproductive:No acute finding.  Hysterectomy. Other: No ascites or pneumoperitoneum. Musculoskeletal: No acute abnormalities. Levoscoliosis with generalized spinal degeneration. IMPRESSION: 1. No acute finding, including evidence of pancreatitis. 2. Chronic findings are stable from 2018, as described above. Electronically Signed   By: Monte Fantasia M.D.   On: 03/28/2019 06:38    Pertinent labs & imaging results that were available during my care of the  patient were reviewed by me and considered in my medical decision making (see chart for details).  Medications Ordered in ED Medications  sodium chloride flush (NS) 0.9 % injection 3 mL (3 mLs Intravenous Given 03/28/19 0525)  alum & mag hydroxide-simeth (MAALOX/MYLANTA) 200-200-20 MG/5ML suspension 30 mL (30 mLs Oral Given 03/28/19 0500)  iohexol (OMNIPAQUE) 300 MG/ML solution 100 mL (100 mLs Intravenous Contrast Given 03/28/19 0602)  Procedures Procedures  (including critical care time)  Medical Decision Making / ED Course I have reviewed the nursing notes for this encounter and the patient's prior records (if available in EHR or on provided paperwork).   Julie Norton was evaluated in Emergency Department on 03/28/2019 for the symptoms described in the history of present illness. She was evaluated in the context of the global COVID-19 pandemic, which necessitated consideration that the patient might be at risk for infection with the SARS-CoV-2 virus that causes COVID-19. Institutional protocols and algorithms that pertain to the evaluation of patients at risk for COVID-19 are in a state of rapid change based on information released by regulatory bodies including the CDC and federal and state organizations. These policies and algorithms were followed during the patient's care in the ED.  Epigastric abdominal discomfort. Mild tenderness to palpation without peritonitis Labs without leukocytosis.  Close to baseline hemoglobin.  No evidence of bili obstruction.  Mildly elevated lipase.  No prior history of pancreatitis.  Denies alcohol use. On record review prior CT without evidence of gallstones. CT without evidence of pancreatitis or other serious intra-inflammatory/infectious process, or bowel obstruction.  EKG with rate controlled A. fib.  Initial troponin  negative.  Doubt cardiac etiology, such as ACS.   Tolerating oral intake.  The patient appears reasonably screened and/or stabilized for discharge and I doubt any other medical condition or other [facility] requiring further screening, evaluation, or treatment in the ED at this time prior to discharge.  The patient is safe for discharge with strict return precautions.       Final Clinical Impression(s) / ED Diagnoses Final diagnoses:  Epigastric discomfort    The patient appears reasonably screened and/or stabilized for discharge and I doubt any other medical condition or other [facility] requiring further screening, evaluation, or treatment in the ED at this time prior to discharge.  Disposition: Discharge  Condition: Good  I have discussed the results, Dx and Tx plan with the patient who expressed understanding and agree(s) with the plan. Discharge instructions discussed at great length. The patient was given strict return precautions who verbalized understanding of the instructions. No further questions at time of discharge.    ED Discharge Orders    None       Follow Up: Eulas Post, MD [place] [address] [phone]  Schedule an appointment as soon as possible for a visit  As needed      This chart was dictated using voice recognition software.  Despite best efforts to proofread,  errors can occur which can change the documentation meaning.   Fatima Blank, MD 03/28/19 [phone]

## 2019-03-28 NOTE — Telephone Encounter (Signed)
Pt evaluated and discharged [facility] ED.

## 2019-03-29 NOTE — Progress Notes (Signed)
Cardiology Office Note   Date:  04/03/2019   ID:  Julie Norton, DOB 02-Feb-1930, MRN [id number]  PCP:  Eulas Post, MD  Cardiologist: Dody Smartt Martin[place] MD  No chief complaint on file.     History of Present Illness: Julie Norton is a 85 y.o. female who presents for follow up of Atrial fibrillation.   She has a past history of hypercholesterolemia. She also has a history of DM on  insulin. Echo in 2015 showed mild MR with normal LV function. She had a normal Myoview study in November 2017.   She did undergo GI evaluation in September 2018  for iron deficiency anemia. She had a small hiatal hernia and diverticulosis. One small rectal polyp.   She was seen by Dr. Elease Hashimoto on 09/20/17. Found to be in Afib with rate 95. Started on Eliquis. Follow up Echo showed mild biatrial enlargement and normal LV function. She  noted some fatigue, decreased energy and leg swelling. Some dyspnea and chest tightness. Was started on lasix with improvement of these symptoms. She subsequently underwent TEE guided DCCV on 10/28/17 with return to NSR.   Was seen in early September with  increased leg swelling for 2 months. Noted SOB going up stairs. Was found to be in Afib with rate 105. Toprol XL was added for rate control. Lasix was increased. Since she had missed a couple of Eliquis doses she DCCV was deferred until seen back today. We did try her on Flecainide but this resulted in severe nausea and diarrhea and was discontinued.   She was seen in the ED on 03/27/19 with complaints of epigastric pain/abdominal pain. Mildly elevated lipase. Other labs OK. Ecg in Afib with rate 80. CXR and CT abdomen were normal.  She has been consistent with her Eliquis dosing. Still notes some dyspnea going up incline and generally just doesn't feel well. Does have some intermittent abdominal pain.    Past Medical History:  Diagnosis Date   Abdominal adhesions    Abdominal gas pain    suspected -- may be  related to intermittent right upper quadrant discomfort radiating around her back       Breast cancer (HCC)    Breast mass    left breast   Cancer (HCC)    left breast   Chest pain    intermittent right upper quadrant discomfort radiating around her back       Complication of anesthesia    Diabetes mellitus without complication (HCC)    Type II   Dyspepsia    GERD (gastroesophageal reflux disease)    H/O: hysterectomy 1978   Hypercholesteremia    Hypothyroidism    Personal history of radiation therapy    Pneumonia    PONV (postoperative nausea and vomiting)    Tendinitis    of the right arm and shoulder   Torn tendon    right shoulder    Past Surgical History:  Procedure Laterality Date   ABDOMINAL HYSTERECTOMY     APPENDECTOMY  1978   BLADDER REPAIR     BREAST LUMPECTOMY Left 02/24/2011   central partial mastectomy - dr Margot Chimes   BREAST REDUCTION SURGERY     CARDIOVERSION N/A 10/28/2017   Procedure: CARDIOVERSION;  Surgeon: Lelon Perla, MD;  Location: [place];  Service: Cardiovascular;  Laterality: N/A;   COLONOSCOPY WITH PROPOFOL N/A 03/24/2017   Procedure: COLONOSCOPY WITH PROPOFOL;  Surgeon: Wilford Corner, MD;  Location: [place];  Service: Endoscopy;  Laterality:  N/A;   ESOPHAGOGASTRODUODENOSCOPY (EGD) WITH PROPOFOL N/A 03/24/2017   Procedure: ESOPHAGOGASTRODUODENOSCOPY (EGD) WITH PROPOFOL;  Surgeon: Wilford Corner, MD;  Location: [place];  Service: Endoscopy;  Laterality: N/A;   LIPOMA EXCISION Right    REDUCTION MAMMAPLASTY Bilateral 1998   RETINAL DETACHMENT SURGERY Left    SALPINGOOPHORECTOMY     TEE WITHOUT CARDIOVERSION N/A 10/28/2017   Procedure: TRANSESOPHAGEAL ECHOCARDIOGRAM (TEE);  Surgeon: Lelon Perla, MD;  [facility] ENDOSCOPY;  Service: Cardiovascular;  Laterality: N/A;   [place]   at 85 years of age     Current Outpatient Medications  Medication Sig Dispense  Refill   atorvastatin (LIPITOR) 10 MG tablet TAKE ONE-HALF TABLET BY  MOUTH EVERY OTHER DAY OR AS DIRECTED (Patient taking differently: Take 5 mg by mouth every other day. ) 45 tablet 1   ELIQUIS 5 MG TABS tablet TAKE 1 TABLET BY MOUTH TWICE A DAY (Patient taking differently: Take 5 mg by mouth 2 (two) times daily. ) 180 tablet 1   furosemide (LASIX) 20 MG tablet Take 1 tablet (20 mg total) by mouth daily. (Patient taking differently: Take 20 mg by mouth daily as needed for fluid. ) 30 tablet 6   glucose blood (ONE TOUCH ULTRA TEST) test strip Check once daily. E11.40 100 each 11   Insulin Pen Needle 29G X 5MM MISC Use once daily 100 each 3   levothyroxine (SYNTHROID, LEVOTHROID) 50 MCG tablet TAKE 1 TABLET BY MOUTH  DAILY (Patient taking differently: Take 50 mcg by mouth daily before breakfast. ) 90 tablet 1   metFORMIN (GLUCOPHAGE-XR) 500 MG 24 hr tablet TAKE 1 TABLET BY MOUTH  TWICE A DAY (Patient taking differently: Take 500 mg by mouth 2 (two) times daily. ) 180 tablet 1   metoprolol succinate (TOPROL XL) 25 MG 24 hr tablet Take 1 tablet (25 mg total) by mouth daily. 90 tablet 3   omeprazole (PRILOSEC) 20 MG capsule TAKE 1 CAPSULE BY MOUTH TWO TIMES DAILY (Patient taking differently: Take 20 mg by mouth 2 (two) times daily before a meal. ) 180 capsule 3   ONE TOUCH ULTRA TEST test strip CHECK ONCE A DAY 50 each 3   TRESIBA FLEXTOUCH 100 UNIT/ML SOPN FlexTouch Pen INJECT 24 UNITS TOTAL INTO THE SKIN DAILY AT 10PM (Patient taking differently: Inject 20 Units into the skin at bedtime. ) 15 pen 1   vitamin B-12 (CYANOCOBALAMIN) 100 MCG tablet Take 100 mcg by mouth daily.     No current facility-administered medications for this visit.     Allergies:   Bactrim, Dilaudid [hydromorphone hcl], Hydrocodone, Invokana [canagliflozin], and Macrodantin    Social History:  The patient  reports that she has never smoked. She has never used smokeless tobacco. She reports that she does not drink  alcohol or use drugs.   Family History:  The patient's family history includes Aortic aneurysm in her mother and sister; Cancer in her mother; Diabetes in her mother; Heart attack in her mother; Hypertension in her mother; Pneumonia in her father; Stroke in her mother.    ROS:  Please see the history of present illness.   Otherwise, review of systems are positive for none.   All other systems are reviewed and negative.    PHYSICAL EXAM: VS:  BP (!) 140/93    Pulse 80    Temp (!) 96.8 F (36 C)    Ht 5' 1.5" (1.562 m)    Wt 153 lb (69.4 kg)  SpO2 100%    BMI 28.44 kg/m  , BMI Body mass index is 28.44 kg/m. GENERAL:  Well appearing WF  In NAD HEENT:  PERRL, EOMI, sclera are clear. Oropharynx is clear. NECK:  No jugular venous distention, carotid upstroke brisk and symmetric, no bruits, no thyromegaly or adenopathy LUNGS:  Clear to auscultation bilaterally CHEST:  Unremarkable HEART:  IRRR,  PMI not displaced or sustained,S1 and S2 within normal limits, no S3, no S4: no clicks, no rubs, no murmurs ABD:  Soft, nontender. BS +, no masses or bruits. No hepatomegaly, no splenomegaly EXT:  2 + pulses throughout, 1-2+ edema, no cyanosis no clubbing SKIN:  Warm and dry.  No rashes NEURO:  Alert and oriented x 3. Cranial nerves II through XII intact. PSYCH:  Cognitively intact  EKG:  EKG is ordered today. Afib with rate 79.   RBBB. I have personally reviewed and interpreted this study.  Recent Labs: 12/21/2018: TSH 3.03 03/27/2019: ALT 18; BUN 11; Creatinine, Ser 0.97; Hemoglobin 9.9; Platelets 181; Potassium 4.3; Sodium 137    Lipid Panel    Component Value Date/Time   CHOL 152 03/28/2018 1015   CHOL 135 04/26/2017 0840   TRIG 123.0 03/28/2018 1015   HDL 45.10 03/28/2018 1015   HDL 45 04/26/2017 0840   CHOLHDL 3 03/28/2018 1015   VLDL 24.6 03/28/2018 1015   LDLCALC 82 03/28/2018 1015   LDLCALC 70 04/26/2017 0840      Wt Readings from Last 3 Encounters:  04/03/19 153 lb (69.4 kg)   03/28/19 150 lb (68 kg)  03/21/19 149 lb 9.6 oz (67.9 kg)      Lexiscan myoview 05/26/16:Study Highlights    Nuclear stress EF: 72%.  There was no ST segment deviation noted during stress.  This is a low risk study.  The left ventricular ejection fraction is hyperdynamic (>65%).   1. Small, mild mid-anteroseptal perfusion defect.  This defect was actually more intense at rest than with stress.  No ischemia.  Suspect attenuation.  2. EF 72% with normal wall motion.  3. Overall low risk study.     Echo 09/24/17: Study Conclusions  - Left ventricle: The cavity size was normal. Systolic function was   normal. The estimated ejection fraction was in the range of 55%   to 60%. Wall motion was normal; there were no regional wall   motion abnormalities. - Mitral valve: There was mild regurgitation. - Left atrium: The atrium was mildly dilated. - Right atrium: The atrium was mildly dilated. - Atrial septum: No defect or patent foramen ovale was identified. - Pulmonary arteries: Systolic pressure was mildly increased. PA   peak pressure: 37 mm Hg (S).    ASSESSMENT AND PLAN:  1. Atrial fibrillation. Recurrent and persistent. [place] vasc score of 4. On Eliquis. Rate controlled on Toprol XL.  S/p TEE guided DCCV in April 2019. I have recommended repeat DCCV. She is intolerant of Flecainide. If she should have early recurrence of Afib I would refer her to the Afib clinic to discuss other options. Risks and benefits of DCCV reviewed in detail.   2.  Hypercholesterolemia-  She is at goal. Continue low dose lipitor.   3. Acute diastolic CHF. More edema since in Afib. On lasix 20 mg daily. Salt restriction.   4. Diabetes mellitus- now on insulin.    5. History of Breast CA.   Follow up in 6 months  Signed, Derreck Wiltsey Martin[place] MD, [facility]   04/03/2019 9:24 AM  [facility]

## 2019-03-29 NOTE — H&P (View-Only) (Signed)
Cardiology Office Note   Date:  04/03/2019   ID:  Julie Norton, DOB 06/25/1930, MRN [id number]  PCP:  Julie Post, Norton  Cardiologist: Julie Norton  No chief complaint on file.     History of Present Illness: Julie Norton is a 85 y.o. female who presents for follow up of Atrial fibrillation.   She has a past history of hypercholesterolemia. She also has a history of DM on  insulin. Echo in 2015 showed mild MR with normal LV function. She had a normal Myoview study in November 2017.   She did undergo GI evaluation in September 2018  for iron deficiency anemia. She had a small hiatal hernia and diverticulosis. One small rectal polyp.   She was seen by Dr. Elease Norton on 09/20/17. Found to be in Afib with rate 95. Started on Eliquis. Follow up Echo showed mild biatrial enlargement and normal LV function. She  noted some fatigue, decreased energy and leg swelling. Some dyspnea and chest tightness. Was started on lasix with improvement of these symptoms. She subsequently underwent TEE guided DCCV on 10/28/17 with return to NSR.   Was seen in early September with  increased leg swelling for 2 months. Noted SOB going up stairs. Was found to be in Afib with rate 105. Toprol XL was added for rate control. Lasix was increased. Since she had missed a couple of Eliquis doses she DCCV was deferred until seen back today. We did try her on Flecainide but this resulted in severe nausea and diarrhea and was discontinued.   She was seen in the ED on 03/27/19 with complaints of epigastric pain/abdominal pain. Mildly elevated lipase. Other labs OK. Ecg in Afib with rate 80. CXR and CT abdomen were normal.  She has been consistent with her Eliquis dosing. Still notes some dyspnea going up incline and generally just doesn't feel well. Does have some intermittent abdominal pain.    Past Medical History:  Diagnosis Date   Abdominal adhesions    Abdominal gas pain    suspected -- may be  related to intermittent right upper quadrant discomfort radiating around her back       Breast cancer (HCC)    Breast mass    left breast   Cancer (HCC)    left breast   Chest pain    intermittent right upper quadrant discomfort radiating around her back       Complication of anesthesia    Diabetes mellitus without complication (HCC)    Type II   Dyspepsia    GERD (gastroesophageal reflux disease)    H/O: hysterectomy 1978   Hypercholesteremia    Hypothyroidism    Personal history of radiation therapy    Pneumonia    PONV (postoperative nausea and vomiting)    Tendinitis    of the right arm and shoulder   Torn tendon    right shoulder    Past Surgical History:  Procedure Laterality Date   ABDOMINAL HYSTERECTOMY     APPENDECTOMY  1978   BLADDER REPAIR     BREAST LUMPECTOMY Left 02/24/2011   central partial mastectomy - dr Margot Chimes   BREAST REDUCTION SURGERY     CARDIOVERSION N/A 10/28/2017   Procedure: CARDIOVERSION;  Surgeon: Lelon Perla, Norton;  Location: [place];  Service: Cardiovascular;  Laterality: N/A;   COLONOSCOPY WITH PROPOFOL N/A 03/24/2017   Procedure: COLONOSCOPY WITH PROPOFOL;  Surgeon: Wilford Corner, Norton;  Location: [place];  Service: Endoscopy;  Laterality:  N/A;   ESOPHAGOGASTRODUODENOSCOPY (EGD) WITH PROPOFOL N/A 03/24/2017   Procedure: ESOPHAGOGASTRODUODENOSCOPY (EGD) WITH PROPOFOL;  Surgeon: Wilford Corner, Norton;  Location: [place];  Service: Endoscopy;  Laterality: N/A;   LIPOMA EXCISION Right    REDUCTION MAMMAPLASTY Bilateral 1998   RETINAL DETACHMENT SURGERY Left    SALPINGOOPHORECTOMY     TEE WITHOUT CARDIOVERSION N/A 10/28/2017   Procedure: TRANSESOPHAGEAL ECHOCARDIOGRAM (TEE);  Surgeon: Lelon Perla, Norton;  Location: [facility] ENDOSCOPY;  Service: Cardiovascular;  Laterality: N/A;   [place]   at 85 years of age     Current Outpatient Medications  Medication Sig Dispense  Refill   atorvastatin (LIPITOR) 10 MG tablet TAKE ONE-HALF TABLET BY  MOUTH EVERY OTHER DAY OR AS DIRECTED (Patient taking differently: Take 5 mg by mouth every other day. ) 45 tablet 1   ELIQUIS 5 MG TABS tablet TAKE 1 TABLET BY MOUTH TWICE A DAY (Patient taking differently: Take 5 mg by mouth 2 (two) times daily. ) 180 tablet 1   furosemide (LASIX) 20 MG tablet Take 1 tablet (20 mg total) by mouth daily. (Patient taking differently: Take 20 mg by mouth daily as needed for fluid. ) 30 tablet 6   glucose blood (ONE TOUCH ULTRA TEST) test strip Check once daily. E11.40 100 each 11   Insulin Pen Needle 29G X 5MM MISC Use once daily 100 each 3   levothyroxine (SYNTHROID, LEVOTHROID) 50 MCG tablet TAKE 1 TABLET BY MOUTH  DAILY (Patient taking differently: Take 50 mcg by mouth daily before breakfast. ) 90 tablet 1   metFORMIN (GLUCOPHAGE-XR) 500 MG 24 hr tablet TAKE 1 TABLET BY MOUTH  TWICE A DAY (Patient taking differently: Take 500 mg by mouth 2 (two) times daily. ) 180 tablet 1   metoprolol succinate (TOPROL XL) 25 MG 24 hr tablet Take 1 tablet (25 mg total) by mouth daily. 90 tablet 3   omeprazole (PRILOSEC) 20 MG capsule TAKE 1 CAPSULE BY MOUTH TWO TIMES DAILY (Patient taking differently: Take 20 mg by mouth 2 (two) times daily before a meal. ) 180 capsule 3   ONE TOUCH ULTRA TEST test strip CHECK ONCE A DAY 50 each 3   TRESIBA FLEXTOUCH 100 UNIT/ML SOPN FlexTouch Pen INJECT 24 UNITS TOTAL INTO THE SKIN DAILY AT 10PM (Patient taking differently: Inject 20 Units into the skin at bedtime. ) 15 pen 1   vitamin B-12 (CYANOCOBALAMIN) 100 MCG tablet Take 100 mcg by mouth daily.     No current facility-administered medications for this visit.     Allergies:   Bactrim, Dilaudid [hydromorphone hcl], Hydrocodone, Invokana [canagliflozin], and Macrodantin    Social History:  The patient  reports that she has never smoked. She has never used smokeless tobacco. She reports that she does not drink  alcohol or use drugs.   Family History:  The patient's family history includes Aortic aneurysm in her mother and sister; Cancer in her mother; Diabetes in her mother; Heart attack in her mother; Hypertension in her mother; Pneumonia in her father; Stroke in her mother.    ROS:  Please see the history of present illness.   Otherwise, review of systems are positive for none.   All other systems are reviewed and negative.    PHYSICAL EXAM: VS:  BP (!) 140/93    Pulse 80    Temp (!) 96.8 F (36 C)    Ht 5' 1.5" (1.562 m)    Wt 153 lb (69.4 kg)  SpO2 100%    BMI 28.44 kg/m  , BMI Body mass index is 28.44 kg/m. GENERAL:  Well appearing WF  In NAD HEENT:  PERRL, EOMI, sclera are clear. Oropharynx is clear. NECK:  No jugular venous distention, carotid upstroke brisk and symmetric, no bruits, no thyromegaly or adenopathy LUNGS:  Clear to auscultation bilaterally CHEST:  Unremarkable HEART:  IRRR,  PMI not displaced or sustained,S1 and S2 within normal limits, no S3, no S4: no clicks, no rubs, no murmurs ABD:  Soft, nontender. BS +, no masses or bruits. No hepatomegaly, no splenomegaly EXT:  2 + pulses throughout, 1-2+ edema, no cyanosis no clubbing SKIN:  Warm and dry.  No rashes NEURO:  Alert and oriented x 3. Cranial nerves II through XII intact. PSYCH:  Cognitively intact  EKG:  EKG is ordered today. Afib with rate 79.   RBBB. I have personally reviewed and interpreted this study.  Recent Labs: 12/21/2018: TSH 3.03 03/27/2019: ALT 18; BUN 11; Creatinine, Ser 0.97; Hemoglobin 9.9; Platelets 181; Potassium 4.3; Sodium 137    Lipid Panel    Component Value Date/Time   CHOL 152 03/28/2018 1015   CHOL 135 04/26/2017 0840   TRIG 123.0 03/28/2018 1015   HDL 45.10 03/28/2018 1015   HDL 45 04/26/2017 0840   CHOLHDL 3 03/28/2018 1015   VLDL 24.6 03/28/2018 1015   LDLCALC 82 03/28/2018 1015   LDLCALC 70 04/26/2017 0840      Wt Readings from Last 3 Encounters:  04/03/19 153 lb (69.4 kg)   03/28/19 150 lb (68 kg)  03/21/19 149 lb 9.6 oz (67.9 kg)      Lexiscan myoview 05/26/16:Study Highlights    Nuclear stress EF: 72%.  There was no ST segment deviation noted during stress.  This is a low risk study.  The left ventricular ejection fraction is hyperdynamic (>65%).   1. Small, mild mid-anteroseptal perfusion defect.  This defect was actually more intense at rest than with stress.  No ischemia.  Suspect attenuation.  2. EF 72% with normal wall motion.  3. Overall low risk study.     Echo 09/24/17: Study Conclusions  - Left ventricle: The cavity size was normal. Systolic function was   normal. The estimated ejection fraction was in the range of 55%   to 60%. Wall motion was normal; there were no regional wall   motion abnormalities. - Mitral valve: There was mild regurgitation. - Left atrium: The atrium was mildly dilated. - Right atrium: The atrium was mildly dilated. - Atrial septum: No defect or patent foramen ovale was identified. - Pulmonary arteries: Systolic pressure was mildly increased. PA   peak pressure: 37 mm Hg (S).    ASSESSMENT AND PLAN:  1. Atrial fibrillation. Recurrent and persistent. [place] vasc score of 4. On Eliquis. Rate controlled on Toprol XL.  S/p TEE guided DCCV in April 2019. I have recommended repeat DCCV. She is intolerant of Flecainide. If she should have early recurrence of Afib I would refer her to the Afib clinic to discuss other options. Risks and benefits of DCCV reviewed in detail.   2.  Hypercholesterolemia-  She is at goal. Continue low dose lipitor.   3. Acute diastolic CHF. More edema since in Afib. On lasix 20 mg daily. Salt restriction.   4. Diabetes mellitus- now on insulin.    5. History of Breast CA.   Follow up in 6 months  Signed, Hortensia Duffin Martinique Norton, [facility]   04/03/2019 9:24 AM  [facility]

## 2019-03-31 DIAGNOSIS — E119 Type 2 diabetes mellitus without complications: Secondary | ICD-10-CM | POA: Diagnosis not present

## 2019-03-31 DIAGNOSIS — Z794 Long term (current) use of insulin: Secondary | ICD-10-CM | POA: Diagnosis not present

## 2019-04-03 ENCOUNTER — Ambulatory Visit (INDEPENDENT_AMBULATORY_CARE_PROVIDER_SITE_OTHER): Payer: Medicare Other | Admitting: Cardiology

## 2019-04-03 ENCOUNTER — Encounter: Payer: Self-pay | Admitting: Cardiology

## 2019-04-03 ENCOUNTER — Other Ambulatory Visit: Payer: Self-pay

## 2019-04-03 ENCOUNTER — Other Ambulatory Visit: Payer: Medicare Other

## 2019-04-03 ENCOUNTER — Other Ambulatory Visit (HOSPITAL_COMMUNITY)
Admission: RE | Admit: 2019-04-03 | Discharge: 2019-04-03 | Disposition: A | Discharge status: Home / Self Care | Payer: Medicare Other | Source: Ambulatory Visit | Attending: Cardiology | Admitting: Cardiology

## 2019-04-03 ENCOUNTER — Other Ambulatory Visit: Payer: Self-pay | Admitting: Cardiology

## 2019-04-03 VITALS — BP 140/93 | HR 80 | Temp 96.8°F | Ht 61.5 in | Wt 153.0 lb

## 2019-04-03 DIAGNOSIS — I4510 Unspecified right bundle-branch block: Secondary | ICD-10-CM | POA: Diagnosis not present

## 2019-04-03 DIAGNOSIS — E7800 Pure hypercholesterolemia, unspecified: Secondary | ICD-10-CM | POA: Diagnosis not present

## 2019-04-03 DIAGNOSIS — I5031 Acute diastolic (congestive) heart failure: Secondary | ICD-10-CM

## 2019-04-03 DIAGNOSIS — Z20828 Contact with and (suspected) exposure to other viral communicable diseases: Secondary | ICD-10-CM | POA: Diagnosis present

## 2019-04-03 DIAGNOSIS — I4819 Other persistent atrial fibrillation: Secondary | ICD-10-CM | POA: Diagnosis not present

## 2019-04-03 DIAGNOSIS — Z01812 Encounter for preprocedural laboratory examination: Secondary | ICD-10-CM

## 2019-04-03 NOTE — Patient Instructions (Signed)
  You are scheduled for a Cardioversion on Thursday 04/06/19 with Dr. Harrell Gave.  Please arrive at the [facility] (Main Entrance A) at [facility]: [address] [place], [place] [address] at 1:00 pm.  DIET: Nothing to eat or drink after midnight except a sip of water with medications (see medication instructions below)  Medication Instructions: Hold Furosemide morning of Cardioversion  Continue your anticoagulant: Eliquis You will need to continue your anticoagulant after your procedure until you  are told by your  Provider that it is safe to stop   Labs:  Covid Test to be done today 9/28 at [facility] Thru Site    You must have a responsible person to drive you home and stay in the waiting area during your procedure. Failure to do so could result in cancellation.  Bring your insurance cards.  *Special Note: Every effort is made to have your procedure done on time. Occasionally there are emergencies that occur at the hospital that may cause delays. Please be patient if a delay does occur.

## 2019-04-03 NOTE — Addendum Note (Signed)
Addended by: Kathyrn Lass on: 04/03/2019 09:36 AM   Modules accepted: Orders

## 2019-04-04 ENCOUNTER — Other Ambulatory Visit: Payer: Self-pay

## 2019-04-04 DIAGNOSIS — E119 Type 2 diabetes mellitus without complications: Secondary | ICD-10-CM | POA: Diagnosis not present

## 2019-04-04 DIAGNOSIS — Z794 Long term (current) use of insulin: Secondary | ICD-10-CM | POA: Diagnosis not present

## 2019-04-04 LAB — NOVEL CORONAVIRUS, NAA (HOSP ORDER, SEND-OUT TO REF LAB; TAT 18-24 HRS): SARS-CoV-2, NAA: NOT DETECTED

## 2019-04-04 MED ORDER — FUROSEMIDE 20 MG PO TABS: 20.0000 mg | ORAL_TABLET | Freq: Every day | ORAL | 6 refills | Status: DC

## 2019-04-05 ENCOUNTER — Ambulatory Visit: Payer: Medicare Other | Admitting: Family Medicine

## 2019-04-06 ENCOUNTER — Ambulatory Visit (HOSPITAL_COMMUNITY): Payer: Medicare Other | Admitting: Anesthesiology

## 2019-04-06 ENCOUNTER — Encounter (HOSPITAL_COMMUNITY)
Admission: RE | Disposition: A | Discharge status: Home / Self Care | Payer: Medicare Other | Source: Ambulatory Visit | Attending: Cardiology

## 2019-04-06 ENCOUNTER — Ambulatory Visit (HOSPITAL_COMMUNITY)
Admission: RE | Admit: 2019-04-06 | Discharge: 2019-04-06 | Disposition: A | Discharge status: Home / Self Care | Payer: Medicare Other | Source: Ambulatory Visit | Attending: Cardiology | Admitting: Cardiology

## 2019-04-06 ENCOUNTER — Other Ambulatory Visit: Payer: Self-pay

## 2019-04-06 ENCOUNTER — Encounter (HOSPITAL_COMMUNITY): Payer: Self-pay | Admitting: Cardiology

## 2019-04-06 DIAGNOSIS — K219 Gastro-esophageal reflux disease without esophagitis: Secondary | ICD-10-CM | POA: Insufficient documentation

## 2019-04-06 DIAGNOSIS — I4819 Other persistent atrial fibrillation: Secondary | ICD-10-CM | POA: Insufficient documentation

## 2019-04-06 DIAGNOSIS — Z794 Long term (current) use of insulin: Secondary | ICD-10-CM | POA: Insufficient documentation

## 2019-04-06 DIAGNOSIS — I10 Essential (primary) hypertension: Secondary | ICD-10-CM | POA: Diagnosis not present

## 2019-04-06 DIAGNOSIS — Z853 Personal history of malignant neoplasm of breast: Secondary | ICD-10-CM | POA: Diagnosis not present

## 2019-04-06 DIAGNOSIS — E119 Type 2 diabetes mellitus without complications: Secondary | ICD-10-CM | POA: Insufficient documentation

## 2019-04-06 DIAGNOSIS — Z7901 Long term (current) use of anticoagulants: Secondary | ICD-10-CM | POA: Diagnosis not present

## 2019-04-06 DIAGNOSIS — I340 Nonrheumatic mitral (valve) insufficiency: Secondary | ICD-10-CM | POA: Diagnosis not present

## 2019-04-06 DIAGNOSIS — Z79899 Other long term (current) drug therapy: Secondary | ICD-10-CM | POA: Insufficient documentation

## 2019-04-06 DIAGNOSIS — E039 Hypothyroidism, unspecified: Secondary | ICD-10-CM | POA: Diagnosis not present

## 2019-04-06 DIAGNOSIS — Z79890 Hormone replacement therapy (postmenopausal): Secondary | ICD-10-CM | POA: Insufficient documentation

## 2019-04-06 DIAGNOSIS — E7800 Pure hypercholesterolemia, unspecified: Secondary | ICD-10-CM | POA: Insufficient documentation

## 2019-04-06 DIAGNOSIS — I5031 Acute diastolic (congestive) heart failure: Secondary | ICD-10-CM | POA: Insufficient documentation

## 2019-04-06 DIAGNOSIS — D0512 Intraductal carcinoma in situ of left breast: Secondary | ICD-10-CM | POA: Diagnosis not present

## 2019-04-06 HISTORY — PX: CARDIOVERSION: SHX1299

## 2019-04-06 LAB — GLUCOSE, CAPILLARY: Glucose-Capillary: 94 mg/dL (ref 70–99)

## 2019-04-06 SURGERY — CARDIOVERSION
Anesthesia: General

## 2019-04-06 MED ORDER — SODIUM CHLORIDE 0.9 % IV SOLN
INTRAVENOUS | Status: DC
  Administered 2019-04-06 (×2): via INTRAVENOUS

## 2019-04-06 MED ORDER — LIDOCAINE HCL (CARDIAC) PF 100 MG/5ML IV SOSY
PREFILLED_SYRINGE | INTRAVENOUS | Status: DC | PRN
  Administered 2019-04-06: 60 mg via INTRAVENOUS

## 2019-04-06 MED ORDER — PROPOFOL 10 MG/ML IV BOLUS
INTRAVENOUS | Status: DC | PRN
  Administered 2019-04-06: 50 mg via INTRAVENOUS

## 2019-04-06 NOTE — Interval H&P Note (Signed)
History and Physical Interval Note:  04/06/2019 1:19 PM  Julie Norton  has presented today for surgery, with the diagnosis of AFIB.  The various methods of treatment have been discussed with the patient and family. After consideration of risks, benefits and other options for treatment, the patient has consented to  Procedure(s): CARDIOVERSION (N/A) as a surgical intervention.  The patient's history has been reviewed, patient examined, no change in status, stable for surgery.  I have reviewed the patient's chart and labs.  Questions were answered to the patient's satisfaction.     Elieser Tetrick Harrell Gave

## 2019-04-06 NOTE — Anesthesia Postprocedure Evaluation (Signed)
Anesthesia Post Note  Patient: Julie Norton  Procedure(s) Performed: CARDIOVERSION (N/A )     Patient location during evaluation: PACU Anesthesia Type: General Level of consciousness: awake and alert Pain management: pain level controlled Vital Signs Assessment: post-procedure vital signs reviewed and stable Respiratory status: spontaneous breathing, nonlabored ventilation and respiratory function stable Cardiovascular status: blood pressure returned to baseline and stable Postop Assessment: no apparent nausea or vomiting Anesthetic complications: no    Last Vitals:  Vitals:   04/06/19 1350 04/06/19 1400  BP: (!) 93/50 (!) 102/48  Pulse: (!) 52 (!) 51  Resp: 16 18  Temp:    SpO2: 98% 100%    Last Pain:  Vitals:   04/06/19 1333  TempSrc: Temporal  PainSc: 0-No pain                 Catalina Gravel

## 2019-04-06 NOTE — Discharge Instructions (Signed)

## 2019-04-06 NOTE — CV Procedure (Signed)
Procedure:   DCCV  Indication:  Symptomatic atrial fibrillation  Procedure Note:  The patient signed informed consent.  They have had had therapeutic anticoagulation with apixaban greater than 3 weeks.  Anesthesia was administered by Dr. Gifford Shave.  Adequate airway was maintained throughout and vital followed per protocol.  They were cardioverted x 1 with 120J of biphasic synchronized energy.  They converted to sinus bradycardia.  There were no apparent complications.  The patient had normal neuro status and respiratory status post procedure with vitals stable as recorded elsewhere.    Follow up:  They will continue on current medical therapy and follow up with cardiology as scheduled.  [name], MD PhD 04/06/2019 1:33 PM

## 2019-04-06 NOTE — Transfer of Care (Signed)
Immediate Anesthesia Transfer of Care Note  Patient: Julie Norton  Procedure(s) Performed: CARDIOVERSION (N/A )  Patient Location: Endoscopy Unit  Anesthesia Type:General  Level of Consciousness: drowsy  Airway & Oxygen Therapy: Patient Spontanous Breathing and Patient connected to nasal cannula oxygen  Post-op Assessment: Report given to RN and Post -op Vital signs reviewed and stable  Post vital signs: Reviewed and stable  Last Vitals:  Vitals Value Taken Time  BP 98/59 04/06/19 1337  Temp 36.7 C 04/06/19 1333  Pulse 55 04/06/19 1338  Resp 15 04/06/19 1335  SpO2 99 % 04/06/19 1338    Last Pain:  Vitals:   04/06/19 1333  TempSrc: Temporal  PainSc: 0-No pain         Complications: No apparent anesthesia complications

## 2019-04-06 NOTE — Anesthesia Preprocedure Evaluation (Signed)
Anesthesia Evaluation  Patient identified by MRN, date of birth, ID band Patient awake    Reviewed: Allergy & Precautions, NPO status , Patient's Chart, lab work & pertinent test results, reviewed documented beta blocker date and time   History of Anesthesia Complications (+) PONV and history of anesthetic complications  Airway Mallampati: II  TM Distance: >3 FB Neck ROM: Full    Dental  (+) Dental Advisory Given, Upper Dentures, Partial Lower   Pulmonary neg pulmonary ROS,    Pulmonary exam normal breath sounds clear to auscultation       Cardiovascular hypertension, Pt. on home beta blockers + dysrhythmias Atrial Fibrillation  Rhythm:Irregular Rate:Abnormal     Neuro/Psych  Neuromuscular disease    GI/Hepatic Neg liver ROS, GERD  ,  Endo/Other  diabetes, Type 2, Oral Hypoglycemic AgentsHypothyroidism   Renal/GU negative Renal ROS     Musculoskeletal negative musculoskeletal ROS (+)   Abdominal   Peds  Hematology negative hematology ROS (+)   Anesthesia Other Findings Day of surgery medications reviewed with the patient.  Reproductive/Obstetrics                             Anesthesia Physical Anesthesia Plan  ASA: III  Anesthesia Plan: General   Post-op Pain Management:    Induction: Intravenous  PONV Risk Score and Plan: 4 or greater and Treatment may vary due to age or medical condition  Airway Management Planned: Natural Airway and Mask  Additional Equipment:   Intra-op Plan:   Post-operative Plan:   Informed Consent: I have reviewed the patients History and Physical, chart, labs and discussed the procedure including the risks, benefits and alternatives for the proposed anesthesia with the patient or authorized representative who has indicated his/her understanding and acceptance.     Dental advisory given  Plan Discussed with: CRNA  Anesthesia Plan Comments:          Anesthesia Quick Evaluation

## 2019-04-07 ENCOUNTER — Encounter (HOSPITAL_COMMUNITY): Payer: Self-pay | Admitting: Cardiology

## 2019-04-08 ENCOUNTER — Other Ambulatory Visit: Payer: Self-pay | Admitting: Family Medicine

## 2019-04-08 DIAGNOSIS — E0590 Thyrotoxicosis, unspecified without thyrotoxic crisis or storm: Secondary | ICD-10-CM

## 2019-04-18 DIAGNOSIS — H00021 Hordeolum internum right upper eyelid: Secondary | ICD-10-CM | POA: Diagnosis not present

## 2019-05-01 ENCOUNTER — Telehealth: Payer: Self-pay | Admitting: Cardiology

## 2019-05-01 NOTE — Telephone Encounter (Signed)
  Patient would like to know if she can get a flu shot at her visit tomorrow @ 10:15 with Jory Sims. She also would like to know if her husband, who also sees Dr Martinique but does not have an appt, can come with her and get a flu shot also. Please advise.

## 2019-05-01 NOTE — Telephone Encounter (Signed)
Attempted to contact patient to advise that she can get her shot at her appointment- but due to not allowing visitors at this time in the office, we are not doing nurse visits for flu shots, her husband would have to have his completed with a PCP.  She did not answer- and unable to leave a message. Will have to try again.

## 2019-05-01 NOTE — Progress Notes (Signed)
Cardiology Office Note   Date:  05/01/2019   ID:  Julie Norton, DOB 1929/09/25, MRN [id number]  PCP:  Eulas Post, MD  Cardiologist: Dr. Martinique CC:  Follow Up   History of Present Illness: Julie Norton is a 85 y.o. female who presents for post [facility] cardioversion for symptomatic atrial fibrillation which was completed by Dr. Harrell Gave on 04/06/2019.  The cardioversion was successful after 1 synchronized shock.  Julie Norton has a history of hypercholesterolemia, diabetes on insulin, iron deficiency anemia.  Most recent echocardiogram in 2015 revealed mild MR with normal LV function, and a normal Myoview stress test in November 2017.  She was seen by Dr. Elease Hashimoto on 09/20/2017 and found to have A. fib with rated 95 bpm and had been started on Eliquis.  She had a follow-up TEE guided DCCV on 10/28/2017 and return to normal sinus rhythm at that time.    When followed up with Dr. Martinique in September 2020 the patient had complaints of lower extremity edema and shortness of breath.  The patient was found to be in atrial fibrillation with a rate of 105 bpm.  Metoprolol was added to her regimen and she was planned for DCCV as discussed above.  Of note, Dr. Martinique documented that the patient was tried on flecainide but this resulted in severe nausea and diarrhea and therefore was discontinued.  Julie Norton comes today without any new complaints.  She remains in normal sinus rhythm via EKG completed today.  She continues to have complaints of chronic lower extremity edema.  She has no bleeding issues, rapid heart rhythm, dizziness, chest pain, or dyspnea on exertion.  She does use Lasix as needed for lower extremity edema.  Past Medical History:  Diagnosis Date  . Abdominal adhesions   . Abdominal gas pain    suspected -- may be related to intermittent right upper quadrant discomfort radiating around her back      . Breast cancer ([place])   . Breast mass    left breast  . Cancer  ([place])    left breast  . Chest pain    intermittent right upper quadrant discomfort radiating around her back      . Complication of anesthesia   . Diabetes mellitus without complication ([place])    Type II  . Dyspepsia   . GERD (gastroesophageal reflux disease)   . H/O: hysterectomy 1978  . Hypercholesteremia   . Hypothyroidism   . Personal history of radiation therapy   . Pneumonia   . PONV (postoperative nausea and vomiting)   . Tendinitis    of the right arm and shoulder  . Torn tendon    right shoulder    Past Surgical History:  Procedure Laterality Date  . ABDOMINAL HYSTERECTOMY    . APPENDECTOMY  1978  . BLADDER REPAIR    . BREAST LUMPECTOMY Left 02/24/2011   central partial mastectomy - dr Margot Chimes  . BREAST REDUCTION SURGERY    . CARDIOVERSION N/A 10/28/2017   Procedure: CARDIOVERSION;  Surgeon: Lelon Perla, MD;  Location: [facility] ENDOSCOPY;  Service: Cardiovascular;  Laterality: N/A;  . CARDIOVERSION N/A 04/06/2019   Procedure: CARDIOVERSION;  Surgeon: [name], MD;  Location: [place];  Service: Cardiovascular;  Laterality: N/A;  . COLONOSCOPY WITH PROPOFOL N/A 03/24/2017   Procedure: COLONOSCOPY WITH PROPOFOL;  Surgeon: Wilford Corner, MD;  Location: [place];  Service: Endoscopy;  Laterality: N/A;  . ESOPHAGOGASTRODUODENOSCOPY (EGD) WITH PROPOFOL N/A 03/24/2017   Procedure: ESOPHAGOGASTRODUODENOSCOPY (EGD) WITH  PROPOFOL;  Surgeon: Wilford Corner, MD;  Location: [facility] ENDOSCOPY;  Service: Endoscopy;  Laterality: N/A;  . LIPOMA EXCISION Right   . REDUCTION MAMMAPLASTY Bilateral 1998  . RETINAL DETACHMENT SURGERY Left   . SALPINGOOPHORECTOMY    . TEE WITHOUT CARDIOVERSION N/A 10/28/2017   Procedure: TRANSESOPHAGEAL ECHOCARDIOGRAM (TEE);  Surgeon: Lelon Perla, MD;  Location: [facility] ENDOSCOPY;  Service: Cardiovascular;  Laterality: N/A;  . TONSILLECTOMY AND ADENOIDECTOMY  1950   at 85 years of age     Current Outpatient Medications  Medication  Sig Dispense Refill  . atorvastatin (LIPITOR) 10 MG tablet TAKE ONE-HALF TABLET BY  MOUTH EVERY OTHER DAY OR AS DIRECTED (Patient taking differently: Take 5 mg by mouth every other day. ) 45 tablet 1  . ELIQUIS 5 MG TABS tablet TAKE 1 TABLET BY MOUTH TWICE A DAY (Patient taking differently: Take 5 mg by mouth 2 (two) times daily. ) 180 tablet 1  . furosemide (LASIX) 20 MG tablet Take 1 tablet (20 mg total) by mouth daily. 30 tablet 6  . glucose blood (ONE TOUCH ULTRA TEST) test strip Check once daily. E11.40 100 each 11  . Insulin Pen Needle 29G X 5MM MISC Use once daily 100 each 3  . levothyroxine (SYNTHROID) 50 MCG tablet TAKE 1 TABLET BY MOUTH  DAILY 90 tablet 1  . metFORMIN (GLUCOPHAGE-XR) 500 MG 24 hr tablet TAKE 1 TABLET BY MOUTH  TWICE A DAY (Patient taking differently: Take 500 mg by mouth 2 (two) times daily. ) 180 tablet 1  . metoprolol succinate (TOPROL XL) 25 MG 24 hr tablet Take 1 tablet (25 mg total) by mouth daily. 90 tablet 3  . omeprazole (PRILOSEC) 20 MG capsule TAKE 1 CAPSULE BY MOUTH TWO TIMES DAILY (Patient taking differently: Take 20 mg by mouth 2 (two) times daily before a meal. ) 180 capsule 3  . ONE TOUCH ULTRA TEST test strip CHECK ONCE A DAY 50 each 3  . TRESIBA FLEXTOUCH 100 UNIT/ML SOPN FlexTouch Pen INJECT 24 UNITS TOTAL INTO THE SKIN DAILY AT 10PM (Patient taking differently: Inject 20 Units into the skin at bedtime. ) 15 pen 1  . vitamin B-12 (CYANOCOBALAMIN) 100 MCG tablet Take 100 mcg by mouth daily.     No current facility-administered medications for this visit.     Allergies:   Bactrim, Dilaudid [hydromorphone hcl], Hydrocodone, Invokana [canagliflozin], and Macrodantin    Social History:  The patient  reports that she has never smoked. She has never used smokeless tobacco. She reports that she does not drink alcohol or use drugs.   Family History:  The patient's family history includes Aortic aneurysm in her mother and sister; Cancer in her mother; Diabetes  in her mother; Heart attack in her mother; Hypertension in her mother; Pneumonia in her father; Stroke in her mother.    ROS: All other systems are reviewed and negative. Unless otherwise mentioned in H&P    PHYSICAL EXAM: VS:  There were no vitals taken for this visit. , BMI There is no height or weight on file to calculate BMI. GEN: Well nourished, well developed, in no acute distress HEENT: normal Neck: no JVD, carotid bruits, or masses Cardiac: RRR; no murmurs, rubs, or gallops, 1+ dependent bilateral pretibial edema prominent amount of varicose veins.   Respiratory:  Clear to auscultation bilaterally, normal work of breathing GI: soft, nontender, nondistended, + BS MS: no deformity or atrophy Skin: warm and dry, no rash Neuro:  Strength and sensation are intact  Psych: euthymic mood, full affect   EKG: Sinus rhythm with first-degree AV block, PR interval 0.238 ms, occasional PVC.  Right bundle branch block rate of 69 bpm  Recent Labs: 12/21/2018: TSH 3.03 03/27/2019: ALT 18; BUN 11; Creatinine, Ser 0.97; Hemoglobin 9.9; Platelets 181; Potassium 4.3; Sodium 137    Lipid Panel    Component Value Date/Time   CHOL 152 03/28/2018 1015   CHOL 135 04/26/2017 0840   TRIG 123.0 03/28/2018 1015   HDL 45.10 03/28/2018 1015   HDL 45 04/26/2017 0840   CHOLHDL 3 03/28/2018 1015   VLDL 24.6 03/28/2018 1015   LDLCALC 82 03/28/2018 1015   LDLCALC 70 04/26/2017 0840      Wt Readings from Last 3 Encounters:  04/03/19 153 lb (69.4 kg)  03/28/19 150 lb (68 kg)  03/21/19 149 lb 9.6 oz (67.9 kg)      Other studies Reviewed:  DCCV 04/06/2019 Procedure Note:  The patient signed informed consent.  They have had had therapeutic anticoagulation with apixaban greater than 3 weeks.  Anesthesia was administered by Dr. Gifford Shave.  Adequate airway was maintained throughout and vital followed per protocol.  They were cardioverted x 1 with 120J of biphasic synchronized energy.  They converted to sinus  bradycardia.  There were no apparent complications.  The patient had normal neuro status and respiratory status post procedure with vitals stable as recorded elsewhere.   TEE Cardioversion 10/28/2017  Left ventricle: Systolic function was normal. The estimated   ejection fraction was in the range of 50% to 55%. Wall motion was   normal; there were no regional wall motion abnormalities. - Mitral valve: There was mild regurgitation. - Left atrium: The atrium was severely dilated. No evidence of   thrombus in the atrial cavity or appendage. No evidence of   thrombus in the atrial cavity or appendage. - Right atrium: The atrium was mildly dilated. - Atrial septum: No defect or patent foramen ovale was identified. - Tricuspid valve: There was moderate regurgitation.  Impressions:  - Low normal LV function; mild MR; severe LAE with no left atrial   appendage thrombus; mild RAE; moderate TR.   ASSESSMENT AND PLAN:  1.  Atrial fibrillation: Status post DCCV performed on 04/06/2019 with Dr. Harrell Gave with 1 successful synchronized shock converting her to normal sinus rhythm.  She remains on metoprolol 24 mg daily, Eliquis 5 mg twice daily.  She offers no complaints of bleeding, excessive bruising, or rapid heart rhythm.  We will continue her on her current medication regimen.  She will follow-up with Dr. Martinique in 6 months unless she becomes symptomatic.  Refills are provided on metoprolol and Eliquis today.  2.  Chronic lower extremity edema: She does have prominent varicose veins bilaterally with dependent edema.  She does use furosemide to help with this.  I have suggested that she have fitting for knee-high support hose to help her with dependent edema especially in light of significant varicose veins.  She verbalizes understanding.  3.  Diabetes mellitus type 2: Followed by PCP.  Current medicines are reviewed at length with the patient today.    Labs/ tests ordered today include: None   Julie Myron. [place] Pugh, ANP, AACC   05/01/2019 4:26 PM    [facility] [place] [address] Office [phone] Fax [phone]  Notice: This dictation was prepared with Dragon dictation along with smaller phrase technology. Any transcriptional errors that result from this process are unintentional and may not be  corrected upon review.

## 2019-05-02 ENCOUNTER — Encounter: Payer: Self-pay | Admitting: Adult Health

## 2019-05-02 ENCOUNTER — Other Ambulatory Visit: Payer: Self-pay

## 2019-05-02 ENCOUNTER — Ambulatory Visit: Payer: Medicare Other | Admitting: Adult Health

## 2019-05-02 VITALS — BP 130/62 | HR 69 | Ht 61.5 in | Wt 152.0 lb

## 2019-05-02 DIAGNOSIS — I4819 Other persistent atrial fibrillation: Secondary | ICD-10-CM

## 2019-05-02 DIAGNOSIS — I83893 Varicose veins of bilateral lower extremities with other complications: Secondary | ICD-10-CM | POA: Diagnosis not present

## 2019-05-02 DIAGNOSIS — Z23 Encounter for immunization: Secondary | ICD-10-CM

## 2019-05-02 MED ORDER — ATORVASTATIN CALCIUM 10 MG PO TABS: ORAL_TABLET | ORAL | 3 refills | Status: DC

## 2019-05-02 MED ORDER — APIXABAN 5 MG PO TABS: 5.0000 mg | ORAL_TABLET | Freq: Two times a day (BID) | ORAL | 3 refills | Status: DC

## 2019-05-02 MED ORDER — METOPROLOL SUCCINATE ER 25 MG PO TB24: 25.0000 mg | ORAL_TABLET | Freq: Every day | ORAL | 3 refills | Status: DC

## 2019-05-02 NOTE — Telephone Encounter (Signed)
Patient seen today for appointment

## 2019-05-02 NOTE — Patient Instructions (Signed)
Medication Instructions:  Continue current medications  *If you need a refill on your cardiac medications before your next appointment, please call your pharmacy*  Lab Work: None Ordered  Testing/Procedures: None Ordered  Follow-Up: At [facility], you and your health needs are our priority.  As part of our continuing mission to provide you with exceptional heart care, we have created designated Provider Care Teams.  These Care Teams include your primary Cardiologist (physician) and Advanced Practice Providers (APPs -  Physician Assistants and Nurse Practitioners) who all work together to provide you with the care you need, when you need it.  Your next appointment:   3 months  The format for your next appointment:   In Person  Provider:   Peter Martinique, MD

## 2019-05-05 ENCOUNTER — Telehealth: Payer: Self-pay | Admitting: Family Medicine

## 2019-05-05 DIAGNOSIS — E538 Deficiency of other specified B group vitamins: Secondary | ICD-10-CM

## 2019-05-05 NOTE — Telephone Encounter (Signed)
Pt needs rxs  for vit b12 100 mcg   and vit d 1000 mcg. [facility] [address]. Pt insurance will pay if rx 's sent from [facility]

## 2019-05-05 NOTE — Telephone Encounter (Signed)
Patient called back and said that her Vitamin D should be 5000 mcg and not 1000. Please correct, thanks.

## 2019-05-08 NOTE — Telephone Encounter (Signed)
Dr. Elease Hashimoto, I will send rx for the vitamin D. Is it ok to send in b12?

## 2019-05-08 NOTE — Telephone Encounter (Signed)
OK to change her Vit d on med list to 5,000 IU once daily.

## 2019-05-08 NOTE — Telephone Encounter (Signed)
Spoke with pt and informed her that Dr. Elease Hashimoto is currently out of office this week and I checked chart and I do not see where Dr. Elease Hashimoto has ever prescribed either one of these medications.  Dr.Burchette please advise

## 2019-05-13 NOTE — Telephone Encounter (Signed)
She should be taking OTC B12 1,000 mcg daily.  She is also due for repeat B12 level.

## 2019-05-15 ENCOUNTER — Other Ambulatory Visit: Payer: Medicare Other

## 2019-05-15 ENCOUNTER — Other Ambulatory Visit: Payer: Self-pay

## 2019-05-15 DIAGNOSIS — E538 Deficiency of other specified B group vitamins: Secondary | ICD-10-CM

## 2019-05-15 LAB — VITAMIN B12: Vitamin B-12: 372 pg/mL (ref 211–911)

## 2019-05-15 MED ORDER — VITAMIN D 125 MCG (5000 UT) PO CAPS: 5000.0000 ug | ORAL_CAPSULE | Freq: Every day | ORAL | 0 refills | Status: DC

## 2019-05-15 NOTE — Addendum Note (Signed)
Addended by: Benson Setting L on: 05/15/2019 10:11 AM   Modules accepted: Orders

## 2019-05-15 NOTE — Telephone Encounter (Signed)
Called pt and informed her of Dr. Erick Blinks message. Pt understood and agreed to have repeat labs done. Pt at the time was not near her calendar to schedule lab appt. Informed her I would see if one of our schedulers could call her for the lab visit.

## 2019-05-15 NOTE — Telephone Encounter (Signed)
The patient is scheduled for labs today at 2 PM

## 2019-06-05 DIAGNOSIS — H00024 Hordeolum internum left upper eyelid: Secondary | ICD-10-CM | POA: Diagnosis not present

## 2019-06-11 ENCOUNTER — Other Ambulatory Visit: Payer: Self-pay | Admitting: Family Medicine

## 2019-06-21 ENCOUNTER — Other Ambulatory Visit: Payer: Self-pay | Admitting: Family Medicine

## 2019-06-21 NOTE — Telephone Encounter (Signed)
We can refill.

## 2019-06-21 NOTE — Telephone Encounter (Signed)
This was recently filled by Jory Sims, NP with Cardiology. Are they taking over filling this or are you still going to fill? She just wants it sent to her [facility]

## 2019-06-22 ENCOUNTER — Other Ambulatory Visit: Payer: Self-pay

## 2019-06-23 ENCOUNTER — Ambulatory Visit: Payer: Medicare Other | Admitting: Family Medicine

## 2019-06-23 ENCOUNTER — Encounter: Payer: Self-pay | Admitting: Family Medicine

## 2019-06-23 ENCOUNTER — Ambulatory Visit (INDEPENDENT_AMBULATORY_CARE_PROVIDER_SITE_OTHER): Payer: Medicare Other | Admitting: Family Medicine

## 2019-06-23 VITALS — BP 126/62 | HR 121 | Temp 97.6°F | Wt 153.3 lb

## 2019-06-23 DIAGNOSIS — E039 Hypothyroidism, unspecified: Secondary | ICD-10-CM | POA: Diagnosis not present

## 2019-06-23 DIAGNOSIS — E1165 Type 2 diabetes mellitus with hyperglycemia: Secondary | ICD-10-CM | POA: Diagnosis not present

## 2019-06-23 DIAGNOSIS — E7800 Pure hypercholesterolemia, unspecified: Secondary | ICD-10-CM

## 2019-06-23 DIAGNOSIS — I4819 Other persistent atrial fibrillation: Secondary | ICD-10-CM | POA: Diagnosis not present

## 2019-06-23 LAB — LIPID PANEL
Cholesterol: 94 mg/dL (ref 0–200)
HDL: 35.2 mg/dL — ABNORMAL LOW (ref >39.00)
LDL Cholesterol: 45 mg/dL (ref 0–99)
NonHDL: 58.9
Total CHOL/HDL Ratio: 3
Triglycerides: 70 mg/dL (ref 0.0–149.0)
VLDL: 14 mg/dL (ref 0.0–40.0)

## 2019-06-23 LAB — BASIC METABOLIC PANEL
BUN: 14 mg/dL (ref 6–23)
CO2: 28 mEq/L (ref 19–32)
Calcium: 9.3 mg/dL (ref 8.4–10.5)
Chloride: 108 mEq/L (ref 96–112)
Creatinine, Ser: 0.77 mg/dL (ref 0.40–1.20)
GFR: 70.88 mL/min (ref >60.00)
Glucose, Bld: 91 mg/dL (ref 70–99)
Potassium: 3.8 mEq/L (ref 3.5–5.1)
Sodium: 142 mEq/L (ref 135–145)

## 2019-06-23 LAB — HEPATIC FUNCTION PANEL
ALT: 9 U/L (ref 0–35)
AST: 16 U/L (ref 0–37)
Albumin: 4.2 g/dL (ref 3.5–5.2)
Alkaline Phosphatase: 60 U/L (ref 39–117)
Bilirubin, Direct: 0.2 mg/dL (ref 0.0–0.3)
Total Bilirubin: 0.9 mg/dL (ref 0.2–1.2)
Total Protein: 6.6 g/dL (ref 6.0–8.3)

## 2019-06-23 LAB — HEMOGLOBIN A1C: Hgb A1c MFr Bld: 8.1 % — ABNORMAL HIGH (ref 4.6–6.5)

## 2019-06-23 NOTE — Progress Notes (Signed)
Subjective:     Patient ID: Julie Norton, female   DOB: December 19, 1929, 85 y.o.   MRN: [id number]  HPI Julie Norton is here for medical follow-up.  She has history of type 2 diabetes, persistent atrial fibrillation, hyperlipidemia, hypothyroidism, GERD.  She has had a couple cardioversions most recently this fall.  She unfortunately appears to be back in A. fib at this time.  She had some general fatigue.  No palpitations.  No increase in dyspnea over baseline.  She had a recent stye involving the left upper lid.  She has seen ophthalmologist.  Is using warm compresses.  This has reduced slightly in size.  Blood sugars not monitored regularly.  Generally around 1 30-150 fasting.  Last A1c 7.3%.  She takes [place] 20 units daily and Metformin twice daily.  She remains on Eliquis 5 mg twice daily.  Her major issue is some chronic back pain.  She has seen orthopedist.  She cannot take nonsteroidals.  They would not do any injections because of her Eliquis.  She is taking Tylenol but gets poor relief from that  Past Medical History:  Diagnosis Date  . Abdominal adhesions   . Abdominal gas pain    suspected -- may be related to intermittent right upper quadrant discomfort radiating around her back      . Breast cancer ([place])   . Breast mass    left breast  . Cancer ([place])    left breast  . Chest pain    intermittent right upper quadrant discomfort radiating around her back      . Complication of anesthesia   . Diabetes mellitus without complication ([place])    Type II  . Dyspepsia   . GERD (gastroesophageal reflux disease)   . H/O: hysterectomy 1978  . Hypercholesteremia   . Hypothyroidism   . Personal history of radiation therapy   . Pneumonia   . PONV (postoperative nausea and vomiting)   . Tendinitis    of the right arm and shoulder  . Torn tendon    right shoulder   Past Surgical History:  Procedure Laterality Date  . ABDOMINAL HYSTERECTOMY    . APPENDECTOMY  1978  . BLADDER  REPAIR    . BREAST LUMPECTOMY Left 02/24/2011   central partial mastectomy - dr Margot Chimes  . BREAST REDUCTION SURGERY    . CARDIOVERSION N/A 10/28/2017   Procedure: CARDIOVERSION;  Surgeon: Lelon Perla, MD;  Location: [facility] ENDOSCOPY;  Service: Cardiovascular;  Laterality: N/A;  . CARDIOVERSION N/A 04/06/2019   Procedure: CARDIOVERSION;  Surgeon: [name], MD;  Location: [place];  Service: Cardiovascular;  Laterality: N/A;  . COLONOSCOPY WITH PROPOFOL N/A 03/24/2017   Procedure: COLONOSCOPY WITH PROPOFOL;  Surgeon: Wilford Corner, MD;  Location: [place];  Service: Endoscopy;  Laterality: N/A;  . ESOPHAGOGASTRODUODENOSCOPY (EGD) WITH PROPOFOL N/A 03/24/2017   Procedure: ESOPHAGOGASTRODUODENOSCOPY (EGD) WITH PROPOFOL;  Surgeon: Wilford Corner, MD;  Location: [place];  Service: Endoscopy;  Laterality: N/A;  . LIPOMA EXCISION Right   . REDUCTION MAMMAPLASTY Bilateral 1998  . RETINAL DETACHMENT SURGERY Left   . SALPINGOOPHORECTOMY    . TEE WITHOUT CARDIOVERSION N/A 10/28/2017   Procedure: TRANSESOPHAGEAL ECHOCARDIOGRAM (TEE);  Surgeon: Lelon Perla, MD;  Location: [facility] ENDOSCOPY;  Service: Cardiovascular;  Laterality: N/A;  . [place]   at 85 years of age    reports that she has never smoked. She has never used smokeless tobacco. She reports that she does not drink alcohol  or use drugs. family history includes Aortic aneurysm in her mother and sister; Cancer in her mother; Diabetes in her mother; Heart attack in her mother; Hypertension in her mother; Pneumonia in her father; Stroke in her mother. Allergies  Allergen Reactions  . Bactrim Nausea And Vomiting  . Dilaudid [Hydromorphone Hcl] Other (See Comments)    Unknown  . Hydrocodone Nausea And Vomiting  . Invokana [Canagliflozin] Itching and Other (See Comments)    Yeast Infections   . Macrodantin Nausea And Vomiting     Review of Systems  Constitutional: Positive for  fatigue. Negative for chills, fever and unexpected weight change.  Eyes: Negative for visual disturbance.  Respiratory: Negative for cough, chest tightness, shortness of breath and wheezing.   Cardiovascular: Negative for chest pain, palpitations and leg swelling.  Endocrine: Negative for polydipsia and polyuria.  Genitourinary: Negative for hematuria.  Neurological: Negative for dizziness, seizures, syncope, weakness, light-headedness and headaches.  Hematological: Negative for adenopathy.       Objective:   Physical Exam Constitutional:      Appearance: She is well-developed.  Eyes:     Pupils: Pupils are equal, round, and reactive to light.  Neck:     Thyroid: No thyromegaly.     Vascular: No JVD.  Cardiovascular:     Rate and Rhythm: Normal rate.     Heart sounds: No gallop.      Comments: Irregular rhythm but rate controlled Pulmonary:     Effort: Pulmonary effort is normal. No respiratory distress.     Breath sounds: Normal breath sounds. No wheezing or rales.  Musculoskeletal:     Cervical back: Neck supple.     Right lower leg: Edema present.     Left lower leg: Edema present.     Comments: 1+ pitting edema lower legs bilaterally  Neurological:     General: No focal deficit present.     Mental Status: She is alert.        Assessment:     #1 type 2 diabetes.  History of fair control  #2 hypothyroidism.  Recent TSH at goal  #3 chronic atrial fibrillation.  She appears to be back in A. fib at this time but is rate controlled.  She remains on Eliquis  #4 hyperlipidemia    Plan:     -Check lipids, basic metabolic panel, hepatic panel, and A1c -We have given her slow titration regimen for Tresiba if A1c is climbing by increasing 2 units every 3 days until fasting blood sugars around 130 or less -Recommend 50-month follow-up and sooner as indicated  Eulas Post MD [facility] at [facility]

## 2019-06-25 ENCOUNTER — Other Ambulatory Visit: Payer: Self-pay | Admitting: Family Medicine

## 2019-06-26 DIAGNOSIS — E109 Type 1 diabetes mellitus without complications: Secondary | ICD-10-CM | POA: Diagnosis not present

## 2019-06-26 DIAGNOSIS — M5136 Other intervertebral disc degeneration, lumbar region: Secondary | ICD-10-CM | POA: Diagnosis not present

## 2019-06-26 DIAGNOSIS — I4891 Unspecified atrial fibrillation: Secondary | ICD-10-CM | POA: Diagnosis not present

## 2019-07-05 ENCOUNTER — Other Ambulatory Visit: Payer: Self-pay

## 2019-07-05 ENCOUNTER — Telehealth (INDEPENDENT_AMBULATORY_CARE_PROVIDER_SITE_OTHER): Payer: Medicare Other | Admitting: Family Medicine

## 2019-07-05 ENCOUNTER — Telehealth: Payer: Self-pay | Admitting: *Deleted

## 2019-07-05 DIAGNOSIS — E1165 Type 2 diabetes mellitus with hyperglycemia: Secondary | ICD-10-CM

## 2019-07-05 NOTE — Telephone Encounter (Signed)
Made telephone appointment today for 5pm to discuss

## 2019-07-05 NOTE — Progress Notes (Signed)
This visit type was conducted due to national recommendations for restrictions regarding the COVID-19 pandemic in an effort to limit this patient's exposure and mitigate transmission in our community.   Virtual Visit via Telephone Note  I connected with Julie Norton on 07/05/19 at  5:00 PM EST by telephone and verified that I am speaking with the correct person using two identifiers.   I discussed the limitations, risks, security and privacy concerns of performing an evaluation and management service by telephone and the availability of in person appointments. I also discussed with the patient that there may be a patient responsible charge related to this service. The patient expressed understanding and agreed to proceed.  Location patient: home Location provider: work or home office Participants present for the call: patient, provider Patient did not have a visit in the prior 7 days to address this/these issue(s).   History of Present Illness: Ms. Runser had called with concerns for elevated blood sugars.  She has type 2 diabetes.  She had recent steroid injection in her back per orthopedics on 06/27/2019.  As expected, her blood sugars have climbed since then.  She sent in a range of blood sugars earlier today over the past several days set of range 171-274.  She is currently on Metformin and takes [place] insulin.  She had been on 18 units and has increase this up to 22 units.  Blood sugars have improved some over the past couple of days.  Recent A1c 8.1%.  She is also dealing with increased stress of her husband who has had declining health for several months.  He has visit with cardiology tomorrow.  Husband had some respiratory symptoms with cough and had Covid testing yesterday which came back negative  Past Medical History:  Diagnosis Date  . Abdominal adhesions   . Abdominal gas pain    suspected -- may be related to intermittent right upper quadrant discomfort radiating around  her back      . Breast cancer ([place])   . Breast mass    left breast  . Cancer ([place])    left breast  . Chest pain    intermittent right upper quadrant discomfort radiating around her back      . Complication of anesthesia   . Diabetes mellitus without complication ([place])    Type II  . Dyspepsia   . GERD (gastroesophageal reflux disease)   . H/O: hysterectomy 1978  . Hypercholesteremia   . Hypothyroidism   . Personal history of radiation therapy   . Pneumonia   . PONV (postoperative nausea and vomiting)   . Tendinitis    of the right arm and shoulder  . Torn tendon    right shoulder   Past Surgical History:  Procedure Laterality Date  . ABDOMINAL HYSTERECTOMY    . APPENDECTOMY  1978  . BLADDER REPAIR    . BREAST LUMPECTOMY Left 02/24/2011   central partial mastectomy - dr Margot Chimes  . BREAST REDUCTION SURGERY    . CARDIOVERSION N/A 10/28/2017   Procedure: CARDIOVERSION;  Surgeon: Lelon Perla, MD;  Location: [facility] ENDOSCOPY;  Service: Cardiovascular;  Laterality: N/A;  . CARDIOVERSION N/A 04/06/2019   Procedure: CARDIOVERSION;  Surgeon: [name], MD;  Location: [place];  Service: Cardiovascular;  Laterality: N/A;  . COLONOSCOPY WITH PROPOFOL N/A 03/24/2017   Procedure: COLONOSCOPY WITH PROPOFOL;  Surgeon: Wilford Corner, MD;  Location: [place];  Service: Endoscopy;  Laterality: N/A;  . ESOPHAGOGASTRODUODENOSCOPY (EGD) WITH PROPOFOL N/A 03/24/2017   Procedure:  ESOPHAGOGASTRODUODENOSCOPY (EGD) WITH PROPOFOL;  Surgeon: Wilford Corner, MD;  Location: [place];  Service: Endoscopy;  Laterality: N/A;  . LIPOMA EXCISION Right   . REDUCTION MAMMAPLASTY Bilateral 1998  . RETINAL DETACHMENT SURGERY Left   . SALPINGOOPHORECTOMY    . TEE WITHOUT CARDIOVERSION N/A 10/28/2017   Procedure: TRANSESOPHAGEAL ECHOCARDIOGRAM (TEE);  Surgeon: Lelon Perla, MD;  Location: [facility] ENDOSCOPY;  Service: Cardiovascular;  Laterality: N/A;  . [place]   at 85 years of age    reports that she has never smoked. She has never used smokeless tobacco. She reports that she does not drink alcohol or use drugs. family history includes Aortic aneurysm in her mother and sister; Cancer in her mother; Diabetes in her mother; Heart attack in her mother; Hypertension in her mother; Pneumonia in her father; Stroke in her mother. Allergies  Allergen Reactions  . Bactrim Nausea And Vomiting  . Dilaudid [Hydromorphone Hcl] Other (See Comments)    Unknown  . Hydrocodone Nausea And Vomiting  . Invokana [Canagliflozin] Itching and Other (See Comments)    Yeast Infections   . Macrodantin Nausea And Vomiting      Observations/Objective: Patient sounds cheerful and well on the phone. I do not appreciate any SOB. Speech and thought processing are grossly intact. Patient reported vitals:  Assessment and Plan:  Type 2 diabetes exacerbated by recent steroid injection  -Increase Tresiba to 24 units and may titrate up 2 units every 2 to 3 days until fasting blood sugars consistently 130 or less. -Continue Metformin -Stay well-hydrated -Patient knows blood sugars will start declining as steroids get out of her system and she will gradually reduce Tresiba dose at that point  Follow Up Instructions:  -As above   99441 5-10 99442 11-20 99443 21-30 I did not refer this patient for an OV in the next 24 hours for this/these issue(s).  I discussed the assessment and treatment plan with the patient. The patient was provided an opportunity to ask questions and all were answered. The patient agreed with the plan and demonstrated an understanding of the instructions.   The patient was advised to call back or seek an in-person evaluation if the symptoms worsen or if the condition fails to improve as anticipated.  I provided 22 minutes of non-face-to-face time during this encounter.   [name], MD '

## 2019-07-05 NOTE — Telephone Encounter (Signed)
Copied from [place] [id number]. Topic: General - Inquiry >> Jul 05, 2019  4:25 PM [name] wrote: Reason for CRM:  pt called in and stated that she went to the ortho dr and rec'd a steroid shot.  She stated it effected her sugar and would like to know what Dr Elease Hashimoto would suggest or could she increase it for a couple of months.   Sugar readings  12/30- 207 12/29-171 12/28-201 12/27-217 12/26-274 12/25-253 12/24-268

## 2019-07-28 ENCOUNTER — Telehealth: Payer: Self-pay | Admitting: Family Medicine

## 2019-07-28 NOTE — Telephone Encounter (Signed)
Please advise 

## 2019-07-28 NOTE — Telephone Encounter (Signed)
Patient wanted to know if Dr.Burchette could lower the insulin and send in another prescription.Treid to get patient scheduled for ann appointment to discuss these changes but due to her husband being in Hospice she does not want to leave him. She also asked if someone could take a look at her husbands chart to tell her what is going on, she said that they have called Hospice and does not understand what is going on.

## 2019-07-29 NOTE — Telephone Encounter (Signed)
Please set up Doxy or phone follow up so we can discuss.

## 2019-07-31 ENCOUNTER — Telehealth: Payer: Self-pay | Admitting: Family Medicine

## 2019-07-31 NOTE — Progress Notes (Signed)
Cardiology Office Note   Date:  08/07/2019   ID:  Julie Norton, DOB Jul 13, 1929, MRN [id number]  PCP:  Eulas Post, MD  Cardiologist: Zaniah Titterington [place] MD  Chief Complaint  Patient presents with  . Follow-up  . Edema    A lot.      History of Present Illness: Julie Norton is a 86 y.o. female who presents for follow up of Atrial fibrillation.   She has a past history of hypercholesterolemia. She also has a history of DM on  insulin. Echo in 2015 showed mild MR with normal LV function. She had a normal Myoview study in November 2017.   She did undergo GI evaluation in September 2018  for iron deficiency anemia. She had a small hiatal hernia and diverticulosis. One small rectal polyp.   She was seen by Dr. Elease Hashimoto on 09/20/17. Found to be in Afib with rate 95. Started on Eliquis. Follow up Echo showed mild biatrial enlargement and normal LV function. She  noted some fatigue, decreased energy and leg swelling. Some dyspnea and chest tightness. Was started on lasix with improvement of these symptoms. She subsequently underwent TEE guided DCCV on 10/28/17 with return to NSR.   Was seen in early September with  increased leg swelling for 2 months. Noted SOB going up stairs. Was found to be in Afib with rate 105. Toprol XL was added for rate control. Lasix was increased. Since she had missed a couple of Eliquis doses she DCCV was deferred until seen back today. We did try her on Flecainide but this resulted in severe nausea and diarrhea and was discontinued.   She was seen in the ED on 03/27/19 with complaints of epigastric pain/abdominal pain. Mildly elevated lipase. Other labs OK. Ecg in Afib with rate 80. CXR and CT abdomen were normal.  She subsequently underwent successful DCCV on 04/06/19. Seen back on October 27 and was in NSR. Dr Burchette's note of Dec 18 thought she was back in Afib but no Ecg done. Rate controlled. Since then she has noted more leg edema and fatigue. Mild  SOB. Seen with her daughter today. Her husband is now on home Hospice care since early this month. He has failure to thrive, hypotension, weight loss and declining mental status.    Past Medical History:  Diagnosis Date  . Abdominal adhesions   . Abdominal gas pain    suspected -- may be related to intermittent right upper quadrant discomfort radiating around her back      . Breast cancer ([place])   . Breast mass    left breast  . Cancer ([place])    left breast  . Chest pain    intermittent right upper quadrant discomfort radiating around her back      . Complication of anesthesia   . Diabetes mellitus without complication ([place])    Type II  . Dyspepsia   . GERD (gastroesophageal reflux disease)   . H/O: hysterectomy 1978  . Hypercholesteremia   . Hypothyroidism   . Personal history of radiation therapy   . Pneumonia   . PONV (postoperative nausea and vomiting)   . Tendinitis    of the right arm and shoulder  . Torn tendon    right shoulder    Past Surgical History:  Procedure Laterality Date  . ABDOMINAL HYSTERECTOMY    . APPENDECTOMY  1978  . BLADDER REPAIR    . BREAST LUMPECTOMY Left 02/24/2011   central partial mastectomy -  dr Margot Chimes  . BREAST REDUCTION SURGERY    . CARDIOVERSION N/A 10/28/2017   Procedure: CARDIOVERSION;  Surgeon: Lelon Perla, MD;  Location: [facility] ENDOSCOPY;  Service: Cardiovascular;  Laterality: N/A;  . CARDIOVERSION N/A 04/06/2019   Procedure: CARDIOVERSION;  Surgeon: [name], MD;  Location: [place];  Service: Cardiovascular;  Laterality: N/A;  . COLONOSCOPY WITH PROPOFOL N/A 03/24/2017   Procedure: COLONOSCOPY WITH PROPOFOL;  Surgeon: Wilford Corner, MD;  Location: [place];  Service: Endoscopy;  Laterality: N/A;  . ESOPHAGOGASTRODUODENOSCOPY (EGD) WITH PROPOFOL N/A 03/24/2017   Procedure: ESOPHAGOGASTRODUODENOSCOPY (EGD) WITH PROPOFOL;  Surgeon: Wilford Corner, MD;  Location: [place];  Service: Endoscopy;  Laterality:  N/A;  . LIPOMA EXCISION Right   . REDUCTION MAMMAPLASTY Bilateral 1998  . RETINAL DETACHMENT SURGERY Left   . SALPINGOOPHORECTOMY    . TEE WITHOUT CARDIOVERSION N/A 10/28/2017   Procedure: TRANSESOPHAGEAL ECHOCARDIOGRAM (TEE);  Surgeon: Lelon Perla, MD;  Location: [facility] ENDOSCOPY;  Service: Cardiovascular;  Laterality: N/A;  . TONSILLECTOMY AND ADENOIDECTOMY  1950   at 86 years of age     Current Outpatient Medications  Medication Sig Dispense Refill  . apixaban (ELIQUIS) 5 MG TABS tablet Take 1 tablet (5 mg total) by mouth 2 (two) times daily. 180 tablet 3  . atorvastatin (LIPITOR) 10 MG tablet TAKE ONE-HALF TABLET BY  MOUTH EVERY OTHER DAY OR AS DIRECTED 21 tablet 0  . [employer] D3 125 MCG (5000 UT) capsule TAKE 1 CAPSULE BY MOUTH EVERY DAY 30 capsule 3  . glucose blood (ONE TOUCH ULTRA TEST) test strip Check once daily. E11.40 100 each 11  . Insulin Pen Needle 29G X 5MM MISC Use once daily 100 each 3  . levothyroxine (SYNTHROID) 50 MCG tablet TAKE 1 TABLET BY MOUTH  DAILY 90 tablet 1  . metFORMIN (GLUCOPHAGE-XR) 500 MG 24 hr tablet TAKE 1 TABLET BY MOUTH  TWICE DAILY 180 tablet 1  . metoprolol succinate (TOPROL XL) 25 MG 24 hr tablet Take 1 tablet (25 mg total) by mouth daily. 90 tablet 3  . omeprazole (PRILOSEC) 20 MG capsule TAKE 1 CAPSULE BY MOUTH TWO TIMES DAILY (Patient taking differently: Take 20 mg by mouth 2 (two) times daily before a meal. ) 180 capsule 3  . ONE TOUCH ULTRA TEST test strip CHECK ONCE A DAY 50 each 3  . TRESIBA FLEXTOUCH 100 UNIT/ML SOPN FlexTouch Pen INJECT 24 UNITS TOTAL INTO THE SKIN DAILY AT 10PM (Patient taking differently: Inject 20 Units into the skin at bedtime. ) 15 pen 1  . vitamin B-12 (CYANOCOBALAMIN) 100 MCG tablet Take 100 mcg by mouth daily.    Marland Kitchen amiodarone (PACERONE) 200 MG tablet Take 2 tablets (400 mg total) by mouth daily. 180 tablet 3  . furosemide (LASIX) 40 MG tablet Take 1 tablet (40 mg total) by mouth daily. 90 tablet 3   No current  facility-administered medications for this visit.    Allergies:   Bactrim, Dilaudid [hydromorphone hcl], Hydrocodone, Invokana [canagliflozin], and Macrodantin    Social History:  The patient  reports that she has never smoked. She has never used smokeless tobacco. She reports that she does not drink alcohol or use drugs.   Family History:  The patient's family history includes Aortic aneurysm in her mother and sister; Cancer in her mother; Diabetes in her mother; Heart attack in her mother; Hypertension in her mother; Pneumonia in her father; Stroke in her mother.    ROS:  Please see the history of present illness.  Otherwise, review of systems are positive for none.   All other systems are reviewed and negative.    PHYSICAL EXAM: VS:  BP 130/82 (BP Location: Left Arm, Patient Position: Sitting, Cuff Size: Normal)   Pulse 80   Temp 97.7 F (36.5 C)   Ht 5\' 1"  (1.549 m)   Wt 144 lb (65.3 kg)   BMI 27.21 kg/m  , BMI Body mass index is 27.21 kg/m. GENERAL:  Well appearing WF  In NAD HEENT:  PERRL, EOMI, sclera are clear. Oropharynx is clear. NECK:  No jugular venous distention, carotid upstroke brisk and symmetric, no bruits, no thyromegaly or adenopathy LUNGS:  Clear to auscultation bilaterally CHEST:  Unremarkable HEART:  IRRR,  PMI not displaced or sustained,S1 and S2 within normal limits, no S3, no S4: no clicks, no rubs, no murmurs ABD:  Soft, nontender. BS +, no masses or bruits. No hepatomegaly, no splenomegaly EXT:  2 + pulses throughout, 1-2+ edema, no cyanosis no clubbing SKIN:  Warm and dry.  No rashes NEURO:  Alert and oriented x 3. Cranial nerves II through XII intact. PSYCH:  Cognitively intact  EKG:  EKG is ordered today. Afib with rate 80.   RBBB. I have personally reviewed and interpreted this study.  Recent Labs: 12/21/2018: TSH 3.03 03/27/2019: Hemoglobin 9.9; Platelets 181 06/23/2019: ALT 9; BUN 14; Creatinine, Ser 0.77; Potassium 3.8; Sodium 142    Lipid  Panel    Component Value Date/Time   CHOL 94 06/23/2019 0825   CHOL 135 04/26/2017 0840   TRIG 70.0 06/23/2019 0825   HDL 35.20 (L) 06/23/2019 0825   HDL 45 04/26/2017 0840   CHOLHDL 3 06/23/2019 0825   VLDL 14.0 06/23/2019 0825   LDLCALC 45 06/23/2019 0825   LDLCALC 70 04/26/2017 0840      Wt Readings from Last 3 Encounters:  08/07/19 144 lb (65.3 kg)  06/23/19 153 lb 4.8 oz (69.5 kg)  05/02/19 152 lb (68.9 kg)      Lexiscan myoview 05/26/16:Study Highlights    Nuclear stress EF: 72%.  There was no ST segment deviation noted during stress.  This is a low risk study.  The left ventricular ejection fraction is hyperdynamic (>65%).   1. Small, mild mid-anteroseptal perfusion defect.  This defect was actually more intense at rest than with stress.  No ischemia.  Suspect attenuation.  2. EF 72% with normal wall motion.  3. Overall low risk study.     Echo 09/24/17: Study Conclusions  - Left ventricle: The cavity size was normal. Systolic function was   normal. The estimated ejection fraction was in the range of 55%   to 60%. Wall motion was normal; there were no regional wall   motion abnormalities. - Mitral valve: There was mild regurgitation. - Left atrium: The atrium was mildly dilated. - Right atrium: The atrium was mildly dilated. - Atrial septum: No defect or patent foramen ovale was identified. - Pulmonary arteries: Systolic pressure was mildly increased. PA   peak pressure: 37 mm Hg (S).    ASSESSMENT AND PLAN:  1. Atrial fibrillation. Recurrent and persistent. [place] vasc score of 4. On Eliquis. Rate controlled on Toprol XL.  S/p TEE guided DCCV in April 2019. Repeat DCCV on April 06, 2019. She is intolerant of Flecainide. She is clearly symptomatic with her Afib. I think the next option is to start her on AAD therapy with amiodarone. Tikosyn would be another option but she cannot be hospitalized for 3 days now with  her husband's situation. Will start  amiodarone 400 mg daily. If she tolerates this will see back and consider repeat DCCV on drug in 3-4 weeks.   2.  Hypercholesterolemia-  She is at goal. Continue low dose lipitor.   3. Acute on chronic diastolic CHF. More edema since in Afib. Will increase lasix to 40 mg daily. Sodium restriction  4. Diabetes mellitus- now on insulin.    5. History of Breast CA.   Follow up in 3-4 weeks.  Signed, Glendine Swetz [place] MD, [facility]   08/07/2019 10:35 AM    [place]

## 2019-07-31 NOTE — Telephone Encounter (Signed)
Left detailed message for patient to call office to schedule telephone visit per Dr. Elease Hashimoto.

## 2019-07-31 NOTE — Telephone Encounter (Signed)
Per previous message on 07/28/19 patient is scheduled to discuss insulin.

## 2019-07-31 NOTE — Telephone Encounter (Signed)
Patient scheduled for 08/01/19 telephone visit to discuss insulin.

## 2019-07-31 NOTE — Telephone Encounter (Signed)
Disregard her message from last week about patient's insulin.

## 2019-08-01 ENCOUNTER — Telehealth (INDEPENDENT_AMBULATORY_CARE_PROVIDER_SITE_OTHER): Payer: Medicare Other | Admitting: Family Medicine

## 2019-08-01 ENCOUNTER — Other Ambulatory Visit: Payer: Self-pay

## 2019-08-01 DIAGNOSIS — E1165 Type 2 diabetes mellitus with hyperglycemia: Secondary | ICD-10-CM

## 2019-08-01 DIAGNOSIS — E119 Type 2 diabetes mellitus without complications: Secondary | ICD-10-CM | POA: Diagnosis not present

## 2019-08-01 NOTE — Progress Notes (Signed)
This visit type was conducted due to national recommendations for restrictions regarding the COVID-19 pandemic in an effort to limit this patient's exposure and mitigate transmission in our community.   Virtual Visit via Telephone Note  I connected with Julie Norton on 08/01/19 at  3:15 PM EST by telephone and verified that I am speaking with the correct person using two identifiers.   I discussed the limitations, risks, security and privacy concerns of performing an evaluation and management service by telephone and the availability of in person appointments. I also discussed with the patient that there may be a patient responsible charge related to this service. The patient expressed understanding and agreed to proceed.  Location patient: home Location provider: work or home office Participants present for the call: patient, provider Patient did not have a visit in the prior 7 days to address this/these issue(s).   History of Present Illness: Julie Norton called regarding some questions about insulin. She has type 2 diabetes and currently takes Metformin and [place]. She had several steroid injections recently per orthopedist and her A1c jumped up a month ago to 8.1%. We titrated her [name] to 24 units. However, her blood sugars have reduced greatly over the past month. This likely reflects the steroids getting out of her system. She is currently on 12 units daily and she had fasting blood sugar is 108. She had a couple recent mild hypoglycemic symptoms.  She went recently to pharmacy to get her medication filled and this had jumped in price from $35-$108. She wanted to discuss other potential options. She has not yet checked with insurance to see if they cover another long-acting insulin at better cost  Her husband just turned 69. He has severe systolic heart failure and recently went on hospice. He has shown some mild recovery over the past month with increased appetite and he is starting  to walk a little bit. His deteriorating health has been naturally very stressful for her.  Past Medical History:  Diagnosis Date  . Abdominal adhesions   . Abdominal gas pain    suspected -- may be related to intermittent right upper quadrant discomfort radiating around her back      . Breast cancer ([place])   . Breast mass    left breast  . Cancer ([place])    left breast  . Chest pain    intermittent right upper quadrant discomfort radiating around her back      . Complication of anesthesia   . Diabetes mellitus without complication ([place])    Type II  . Dyspepsia   . GERD (gastroesophageal reflux disease)   . H/O: hysterectomy 1978  . Hypercholesteremia   . Hypothyroidism   . Personal history of radiation therapy   . Pneumonia   . PONV (postoperative nausea and vomiting)   . Tendinitis    of the right arm and shoulder  . Torn tendon    right shoulder   Past Surgical History:  Procedure Laterality Date  . ABDOMINAL HYSTERECTOMY    . APPENDECTOMY  1978  . BLADDER REPAIR    . BREAST LUMPECTOMY Left 02/24/2011   central partial mastectomy - dr Margot Chimes  . BREAST REDUCTION SURGERY    . CARDIOVERSION N/A 10/28/2017   Procedure: CARDIOVERSION;  Surgeon: Lelon Perla, MD;  Location: [facility] ENDOSCOPY;  Service: Cardiovascular;  Laterality: N/A;  . CARDIOVERSION N/A 04/06/2019   Procedure: CARDIOVERSION;  Surgeon: [name], MD;  Location: [place];  Service: Cardiovascular;  Laterality: N/A;  .  COLONOSCOPY WITH PROPOFOL N/A 03/24/2017   Procedure: COLONOSCOPY WITH PROPOFOL;  Surgeon: Wilford Corner, MD;  Location: [place];  Service: Endoscopy;  Laterality: N/A;  . ESOPHAGOGASTRODUODENOSCOPY (EGD) WITH PROPOFOL N/A 03/24/2017   Procedure: ESOPHAGOGASTRODUODENOSCOPY (EGD) WITH PROPOFOL;  Surgeon: Wilford Corner, MD;  Location: [place];  Service: Endoscopy;  Laterality: N/A;  . LIPOMA EXCISION Right   . REDUCTION MAMMAPLASTY Bilateral 1998  . RETINAL  DETACHMENT SURGERY Left   . SALPINGOOPHORECTOMY    . TEE WITHOUT CARDIOVERSION N/A 10/28/2017   Procedure: TRANSESOPHAGEAL ECHOCARDIOGRAM (TEE);  Surgeon: Lelon Perla, MD;  Location: [facility] ENDOSCOPY;  Service: Cardiovascular;  Laterality: N/A;  . [place]   at 86 years of age    reports that she has never smoked. She has never used smokeless tobacco. She reports that she does not drink alcohol or use drugs. family history includes Aortic aneurysm in her mother and sister; Cancer in her mother; Diabetes in her mother; Heart attack in her mother; Hypertension in her mother; Pneumonia in her father; Stroke in her mother. Allergies  Allergen Reactions  . Bactrim Nausea And Vomiting  . Dilaudid [Hydromorphone Hcl] Other (See Comments)    Unknown  . Hydrocodone Nausea And Vomiting  . Invokana [Canagliflozin] Itching and Other (See Comments)    Yeast Infections   . Macrodantin Nausea And Vomiting      Observations/Objective: Patient sounds cheerful and well on the phone. I do not appreciate any SOB. Speech and thought processing are grossly intact. Patient reported vitals:  Assessment and Plan:  Type 2 diabetes. Recent poor control probably reflecting recent steroid injections  -She has enough [place] to last 2 months. She will be looking at her insurance to see if they have better coverage with Basaglar, Lantus, Levemir, or Toujeo  Follow Up Instructions:  -Recommend follow-up in approximately 3 months and recheck A1c at that time   99441 5-10 99442 11-20 99443 21-30 I did not refer this patient for an OV in the next 24 hours for this/these issue(s).  I discussed the assessment and treatment plan with the patient. The patient was provided an opportunity to ask questions and all were answered. The patient agreed with the plan and demonstrated an understanding of the instructions.   The patient was advised to call back or seek an in-person  evaluation if the symptoms worsen or if the condition fails to improve as anticipated.  I provided 18 minutes of non-face-to-face time during this encounter.   [name], MD

## 2019-08-02 ENCOUNTER — Telehealth: Payer: Self-pay | Admitting: Cardiology

## 2019-08-02 NOTE — Telephone Encounter (Signed)
LMTCB

## 2019-08-02 NOTE — Telephone Encounter (Signed)
Patient is calling requesting her daughter attend her upcoming appointment scheduled for 08/07/19 due to her mind being all over the place because her husband was recently admitted into hospice.

## 2019-08-07 ENCOUNTER — Ambulatory Visit: Payer: Medicare Other | Admitting: Cardiology

## 2019-08-07 ENCOUNTER — Other Ambulatory Visit: Payer: Self-pay

## 2019-08-07 ENCOUNTER — Encounter: Payer: Self-pay | Admitting: Cardiology

## 2019-08-07 VITALS — BP 130/82 | HR 80 | Temp 97.7°F | Ht 61.0 in | Wt 144.0 lb

## 2019-08-07 DIAGNOSIS — I4819 Other persistent atrial fibrillation: Secondary | ICD-10-CM

## 2019-08-07 DIAGNOSIS — I5033 Acute on chronic diastolic (congestive) heart failure: Secondary | ICD-10-CM | POA: Diagnosis not present

## 2019-08-07 DIAGNOSIS — E7800 Pure hypercholesterolemia, unspecified: Secondary | ICD-10-CM | POA: Diagnosis not present

## 2019-08-07 DIAGNOSIS — I4510 Unspecified right bundle-branch block: Secondary | ICD-10-CM

## 2019-08-07 MED ORDER — AMIODARONE HCL 200 MG PO TABS: 400.0000 mg | ORAL_TABLET | Freq: Every day | ORAL | 3 refills | Status: DC

## 2019-08-07 MED ORDER — FUROSEMIDE 40 MG PO TABS: 40.0000 mg | ORAL_TABLET | Freq: Every day | ORAL | 3 refills | Status: DC

## 2019-08-07 NOTE — Patient Instructions (Signed)
Increase lasix to 40 mg daily  Start amiodarone 400 mg daily  Follow up in 3-4 weeks

## 2019-08-07 NOTE — Addendum Note (Signed)
Addended by: Kathyrn Lass on: 08/07/2019 10:50 AM   Modules accepted: Orders

## 2019-08-11 ENCOUNTER — Telehealth: Payer: Self-pay | Admitting: Cardiology

## 2019-08-11 NOTE — Telephone Encounter (Signed)
Spoke to patient Dr.Jordan advised to try taking Amiodarone alittle longer.Advised if rash gets worse or does not improve to call back.She stated rash seemed alittle better this afternoon.

## 2019-08-11 NOTE — Telephone Encounter (Signed)
Pt c/o medication issue:  1. Name of Medication: amiodarone (PACERONE) 200 MG tablet  2. How are you currently taking this medication (dosage and times per day)? As directed  3. Are you having a reaction (difficulty breathing--STAT)? rash  4. What is your medication issue? Pt was not sure if the medcine could cause a rash or if she has developed shingles

## 2019-08-11 NOTE — Telephone Encounter (Signed)
Received a call from patient she stated since she started taking Amiodarone she has noticed a red rash on abdomen and back for the past couple of days.Rash does not itch. Advised I will let Dr.Jordan know.

## 2019-08-31 NOTE — Progress Notes (Signed)
Cardiology Office Note   Date:  09/04/2019   ID:  FAJR ZUCCO, DOB 09-25-1929, MRN [id number]  PCP:  Eulas Post, MD  Cardiologist: Patrica Mendell Martinique MD  Chief Complaint  Patient presents with  . Atrial Fibrillation  . Congestive Heart Failure      History of Present Illness: Julie Norton is a 86 y.o. female who presents for follow up of Atrial fibrillation.   She has a past history of hypercholesterolemia. She also has a history of DM on  insulin. Echo in 2015 showed mild MR with normal LV function. She had a normal Myoview study in November 2017.   She did undergo GI evaluation in September 2018  for iron deficiency anemia. She had a small hiatal hernia and diverticulosis. One small rectal polyp.   She was seen by Dr. Elease Hashimoto on 09/20/17. Found to be in Afib with rate 95. Started on Eliquis. Follow up Echo showed mild biatrial enlargement and normal LV function. She  noted some fatigue, decreased energy and leg swelling. Some dyspnea and chest tightness. Was started on lasix with improvement of these symptoms. She subsequently underwent TEE guided DCCV on 10/28/17 with return to NSR.   Was seen in early September with  increased leg swelling for 2 months. Noted SOB going up stairs. Was found to be in Afib with rate 105. Toprol XL was added for rate control. Lasix was increased. Since she had missed a couple of Eliquis doses she DCCV was deferred until seen back today. We did try her on Flecainide but this resulted in severe nausea and diarrhea and was discontinued.   She was seen in the ED on 03/27/19 with complaints of epigastric pain/abdominal pain. Mildly elevated lipase. Other labs OK. Ecg in Afib with rate 80. CXR and CT abdomen were normal.  She subsequently underwent successful DCCV on 04/06/19. Seen back on October 27 and was in NSR. Dr Burchette's note of Dec 18 thought she was back in Afib but no Ecg done. Rate controlled. When seen last month she  noted more  leg edema and fatigue. Mild SOB. Seen with her daughter today. Her husband is now on home Hospice care since early this month. He has failure to thrive, hypotension, weight loss and declining mental status. She was started on amiodarone last month at 400 mg daily. Seems to be tolerating this well. Still feels weak,tired, out of breath. Has trouble sleeping. Is concerned about her thyroid. She has gained 5 lbs.    Past Medical History:  Diagnosis Date  . Abdominal adhesions   . Abdominal gas pain    suspected -- may be related to intermittent right upper quadrant discomfort radiating around her back      . Breast cancer ([place])   . Breast mass    left breast  . Cancer ([place])    left breast  . Chest pain    intermittent right upper quadrant discomfort radiating around her back      . Complication of anesthesia   . Diabetes mellitus without complication ([place])    Type II  . Dyspepsia   . GERD (gastroesophageal reflux disease)   . H/O: hysterectomy 1978  . Hypercholesteremia   . Hypothyroidism   . Personal history of radiation therapy   . Pneumonia   . PONV (postoperative nausea and vomiting)   . Tendinitis    of the right arm and shoulder  . Torn tendon    right shoulder  Past Surgical History:  Procedure Laterality Date  . ABDOMINAL HYSTERECTOMY    . APPENDECTOMY  1978  . BLADDER REPAIR    . BREAST LUMPECTOMY Left 02/24/2011   central partial mastectomy - dr Margot Chimes  . BREAST REDUCTION SURGERY    . CARDIOVERSION N/A 10/28/2017   Procedure: CARDIOVERSION;  Surgeon: Lelon Perla, MD;  Location: [facility] ENDOSCOPY;  Service: Cardiovascular;  Laterality: N/A;  . CARDIOVERSION N/A 04/06/2019   Procedure: CARDIOVERSION;  Surgeon: [name], MD;  Location: [place];  Service: Cardiovascular;  Laterality: N/A;  . COLONOSCOPY WITH PROPOFOL N/A 03/24/2017   Procedure: COLONOSCOPY WITH PROPOFOL;  Surgeon: Wilford Corner, MD;  Location: [place];  Service: Endoscopy;   Laterality: N/A;  . ESOPHAGOGASTRODUODENOSCOPY (EGD) WITH PROPOFOL N/A 03/24/2017   Procedure: ESOPHAGOGASTRODUODENOSCOPY (EGD) WITH PROPOFOL;  Surgeon: Wilford Corner, MD;  Location: [place];  Service: Endoscopy;  Laterality: N/A;  . LIPOMA EXCISION Right   . REDUCTION MAMMAPLASTY Bilateral 1998  . RETINAL DETACHMENT SURGERY Left   . SALPINGOOPHORECTOMY    . TEE WITHOUT CARDIOVERSION N/A 10/28/2017   Procedure: TRANSESOPHAGEAL ECHOCARDIOGRAM (TEE);  Surgeon: Lelon Perla, MD;  [facility] END[facility]COPY;  Service: Cardiovascular;  Laterality: N/A;  . TONSILLECTOMY AND ADENOIDECTOMY  1950   at 86 years of age     Current Outpatient Medications  Medication Sig Dispense Refill  . amiodarone (PACERONE) 200 MG tablet Take 2 tablets (400 mg total) by mouth daily. 180 tablet 3  . apixaban (ELIQUIS) 5 MG TABS tablet Take 1 tablet (5 mg total) by mouth 2 (two) times daily. 180 tablet 3  . atorvastatin (LIPITOR) 10 MG tablet TAKE ONE-HALF TABLET BY  MOUTH EVERY OTHER DAY OR AS DIRECTED 21 tablet 0  . CVS D3 125 MCG (5000 UT) capsule TAKE 1 CAPSULE BY MOUTH EVERY DAY 30 capsule 3  . furosemide (LASIX) 40 MG tablet Take 1 tablet (40 mg total) by mouth daily. 90 tablet 3  . Insulin Pen Needle 29G X 5MM MISC Use once daily 100 each 3  . levothyroxine (SYNTHROID) 50 MCG tablet TAKE 1 TABLET BY MOUTH  DAILY 90 tablet 1  . metFORMIN (GLUCOPHAGE-XR) 500 MG 24 hr tablet TAKE 1 TABLET BY MOUTH  TWICE DAILY 180 tablet 1  . metoprolol succinate (TOPROL XL) 25 MG 24 hr tablet Take 1 tablet (25 mg total) by mouth daily. 90 tablet 3  . omeprazole (PRILOSEC) 20 MG capsule TAKE 1 CAPSULE BY MOUTH TWO TIMES DAILY (Patient taking differently: Take 20 mg by mouth 2 (two) times daily before a meal. ) 180 capsule 3  . ONE TOUCH ULTRA TEST test strip CHECK ONCE A DAY 50 each 3  . TRESIBA FLEXTOUCH 100 UNIT/ML SOPN FlexTouch Pen INJECT 24 UNITS TOTAL INTO THE SKIN DAILY AT 10PM (Patient taking differently: Inject  20 Units into the skin at bedtime. ) 15 pen 1  . vitamin B-12 (CYANOCOBALAMIN) 100 MCG tablet Take 100 mcg by mouth daily.    Marland Kitchen glucose blood (ONE TOUCH ULTRA TEST) test strip Check once daily. E11.40 (Patient not taking: Reported on 09/04/2019) 100 each 11   No current facility-administered medications for this visit.    Allergies:   Bactrim, Dilaudid [hydromorphone hcl], Hydrocodone, Invokana [canagliflozin], and Macrodantin    Social History:  The patient  reports that she has never smoked. She has never used smokeless tobacco. She reports that she does not drink alcohol or use drugs.   Family History:  The patient's family history includes Aortic aneurysm  in her mother and sister; Cancer in her mother; Diabetes in her mother; Heart attack in her mother; Hypertension in her mother; Pneumonia in her father; Stroke in her mother.    ROS:  Please see the history of present illness.   Otherwise, review of systems are positive for none.   All other systems are reviewed and negative.    PHYSICAL EXAM: VS:  BP 127/71   Pulse 60   Temp (!) 94.5 F (34.7 C)   Ht 5\' 1"  (1.549 m)   Wt 149 lb 3.2 oz (67.7 kg)   SpO2 98%   BMI 28.19 kg/m  , BMI Body mass index is 28.19 kg/m. GENERAL:  Well appearing WF  In NAD HEENT:  PERRL, EOMI, sclera are clear. Oropharynx is clear. NECK:  No jugular venous distention, carotid upstroke brisk and symmetric, no bruits, no thyromegaly or adenopathy LUNGS:  Clear to auscultation bilaterally CHEST:  Unremarkable HEART:  IRRR,  PMI not displaced or sustained,S1 and S2 within normal limits, no S3, no S4: no clicks, no rubs, no murmurs ABD:  Soft, nontender. BS +, no masses or bruits. No hepatomegaly, no splenomegaly EXT:  2 + pulses throughout, 2+ edema, no cyanosis no clubbing SKIN:  Warm and dry.  No rashes NEURO:  Alert and oriented x 3. Cranial nerves II through XII intact. PSYCH:  Cognitively intact  EKG:  EKG is not ordered today.   Recent  Labs: 12/21/2018: TSH 3.03 03/27/2019: Hemoglobin 9.9; Platelets 181 06/23/2019: ALT 9; BUN 14; Creatinine, Ser 0.77; Potassium 3.8; Sodium 142    Lipid Panel    Component Value Date/Time   CHOL 94 06/23/2019 0825   CHOL 135 04/26/2017 0840   TRIG 70.0 06/23/2019 0825   HDL 35.20 (L) 06/23/2019 0825   HDL 45 04/26/2017 0840   CHOLHDL 3 06/23/2019 0825   VLDL 14.0 06/23/2019 0825   LDLCALC 45 06/23/2019 0825   LDLCALC 70 04/26/2017 0840      Wt Readings from Last 3 Encounters:  09/04/19 149 lb 3.2 oz (67.7 kg)  08/07/19 144 lb (65.3 kg)  06/23/19 153 lb 4.8 oz (69.5 kg)      Lexiscan myoview 05/26/16:Study Highlights    Nuclear stress EF: 72%.  There was no ST segment deviation noted during stress.  This is a low risk study.  The left ventricular ejection fraction is hyperdynamic (>65%).   1. Small, mild mid-anteroseptal perfusion defect.  This defect was actually more intense at rest than with stress.  No ischemia.  Suspect attenuation.  2. EF 72% with normal wall motion.  3. Overall low risk study.     Echo 09/24/17: Study Conclusions  - Left ventricle: The cavity size was normal. Systolic function was   normal. The estimated ejection fraction was in the range of 55%   to 60%. Wall motion was normal; there were no regional wall   motion abnormalities. - Mitral valve: There was mild regurgitation. - Left atrium: The atrium was mildly dilated. - Right atrium: The atrium was mildly dilated. - Atrial septum: No defect or patent foramen ovale was identified. - Pulmonary arteries: Systolic pressure was mildly increased. PA   peak pressure: 37 mm Hg (S).    ASSESSMENT AND PLAN:  1. Atrial fibrillation. Recurrent and persistent. Pulse still regular on exam. [place] vasc score of 4. On Eliquis without missing any doses. Rate controlled on Toprol XL.  S/p TEE guided DCCV in April 2019. Repeat DCCV on April 06, 2019. She is  intolerant of Flecainide. She is clearly  symptomatic with her Afib. Now on amiodarone. Will go ahead and schedule for repeat DCCV on AAD therapy. Check TFTS.   2.  Hypercholesterolemia-  She is at goal. Continue low dose lipitor.   3. Acute on chronic diastolic CHF. More edema since in Afib. Will increase lasix to 40 mg in the am and 20 mg in the pam.  4. Diabetes mellitus- now on insulin.    5. History of Breast CA.   Follow up post DCCV.   Signed, Josemiguel Gries Martinique MD, [facility]   09/04/2019 12:09 PM    [place]

## 2019-08-31 NOTE — H&P (View-Only) (Signed)
Cardiology Office Note   Date:  09/04/2019   ID:  Julie Norton, DOB Sep 04, 1929, MRN [id number]  PCP:  Julie Post, MD  Cardiologist: Emmalina Espericueta Martinique MD  Chief Complaint  Patient presents with  . Atrial Fibrillation  . Congestive Heart Failure      History of Present Illness: Julie Norton is a 86 y.o. female who presents for follow up of Atrial fibrillation.   She has a past history of hypercholesterolemia. She also has a history of DM on  insulin. Echo in 2015 showed mild MR with normal LV function. She had a normal Myoview study in November 2017.   She did undergo GI evaluation in September 2018  for iron deficiency anemia. She had a small hiatal hernia and diverticulosis. One small rectal polyp.   She was seen by Dr. Elease Hashimoto on 09/20/17. Found to be in Afib with rate 95. Started on Eliquis. Follow up Echo showed mild biatrial enlargement and normal LV function. She  noted some fatigue, decreased energy and leg swelling. Some dyspnea and chest tightness. Was started on lasix with improvement of these symptoms. She subsequently underwent TEE guided DCCV on 10/28/17 with return to NSR.   Was seen in early September with  increased leg swelling for 2 months. Noted SOB going up stairs. Was found to be in Afib with rate 105. Toprol XL was added for rate control. Lasix was increased. Since she had missed a couple of Eliquis doses she DCCV was deferred until seen back today. We did try her on Flecainide but this resulted in severe nausea and diarrhea and was discontinued.   She was seen in the ED on 03/27/19 with complaints of epigastric pain/abdominal pain. Mildly elevated lipase. Other labs OK. Ecg in Afib with rate 80. CXR and CT abdomen were normal.  She subsequently underwent successful DCCV on 04/06/19. Seen back on October 27 and was in NSR. Dr Burchette's note of Dec 18 thought she was back in Afib but no Ecg done. Rate controlled. When seen last month she  noted more  leg edema and fatigue. Mild SOB. Seen with her daughter today. Her husband is now on home Hospice care since early this month. He has failure to thrive, hypotension, weight loss and declining mental status. She was started on amiodarone last month at 400 mg daily. Seems to be tolerating this well. Still feels weak,tired, out of breath. Has trouble sleeping. Is concerned about her thyroid. She has gained 5 lbs.    Past Medical History:  Diagnosis Date  . Abdominal adhesions   . Abdominal gas pain    suspected -- may be related to intermittent right upper quadrant discomfort radiating around her back      . Breast cancer ([place])   . Breast mass    left breast  . Cancer ([place])    left breast  . Chest pain    intermittent right upper quadrant discomfort radiating around her back      . Complication of anesthesia   . Diabetes mellitus without complication ([place])    Type II  . Dyspepsia   . GERD (gastroesophageal reflux disease)   . H/O: hysterectomy 1978  . Hypercholesteremia   . Hypothyroidism   . Personal history of radiation therapy   . Pneumonia   . PONV (postoperative nausea and vomiting)   . Tendinitis    of the right arm and shoulder  . Torn tendon    right shoulder  Past Surgical History:  Procedure Laterality Date  . ABDOMINAL HYSTERECTOMY    . APPENDECTOMY  1978  . BLADDER REPAIR    . BREAST LUMPECTOMY Left 02/24/2011   central partial mastectomy - dr Margot Chimes  . BREAST REDUCTION SURGERY    . CARDIOVERSION N/A 10/28/2017   Procedure: CARDIOVERSION;  Surgeon: Lelon Perla, MD;  Location: [facility] ENDOSCOPY;  Service: Cardiovascular;  Laterality: N/A;  . CARDIOVERSION N/A 04/06/2019   Procedure: CARDIOVERSION;  Surgeon: [name], MD;  Location: [place];  Service: Cardiovascular;  Laterality: N/A;  . COLONOSCOPY WITH PROPOFOL N/A 03/24/2017   Procedure: COLONOSCOPY WITH PROPOFOL;  Surgeon: Wilford Corner, MD;  Location: [place];  Service: Endoscopy;   Laterality: N/A;  . ESOPHAGOGASTRODUODENOSCOPY (EGD) WITH PROPOFOL N/A 03/24/2017   Procedure: ESOPHAGOGASTRODUODENOSCOPY (EGD) WITH PROPOFOL;  Surgeon: Wilford Corner, MD;  Location: [place];  Service: Endoscopy;  Laterality: N/A;  . LIPOMA EXCISION Right   . REDUCTION MAMMAPLASTY Bilateral 1998  . RETINAL DETACHMENT SURGERY Left   . SALPINGOOPHORECTOMY    . TEE WITHOUT CARDIOVERSION N/A 10/28/2017   Procedure: TRANSESOPHAGEAL ECHOCARDIOGRAM (TEE);  Surgeon: Lelon Perla, MD;  Location: [facility] ENDOSCOPY;  Service: Cardiovascular;  Laterality: N/A;  . TONSILLECTOMY AND ADENOIDECTOMY  1950   at 86 years of age     Current Outpatient Medications  Medication Sig Dispense Refill  . amiodarone (PACERONE) 200 MG tablet Take 2 tablets (400 mg total) by mouth daily. 180 tablet 3  . apixaban (ELIQUIS) 5 MG TABS tablet Take 1 tablet (5 mg total) by mouth 2 (two) times daily. 180 tablet 3  . atorvastatin (LIPITOR) 10 MG tablet TAKE ONE-HALF TABLET BY  MOUTH EVERY OTHER DAY OR AS DIRECTED 21 tablet 0  . CVS D3 125 MCG (5000 UT) capsule TAKE 1 CAPSULE BY MOUTH EVERY DAY 30 capsule 3  . furosemide (LASIX) 40 MG tablet Take 1 tablet (40 mg total) by mouth daily. 90 tablet 3  . Insulin Pen Needle 29G X 5MM MISC Use once daily 100 each 3  . levothyroxine (SYNTHROID) 50 MCG tablet TAKE 1 TABLET BY MOUTH  DAILY 90 tablet 1  . metFORMIN (GLUCOPHAGE-XR) 500 MG 24 hr tablet TAKE 1 TABLET BY MOUTH  TWICE DAILY 180 tablet 1  . metoprolol succinate (TOPROL XL) 25 MG 24 hr tablet Take 1 tablet (25 mg total) by mouth daily. 90 tablet 3  . omeprazole (PRILOSEC) 20 MG capsule TAKE 1 CAPSULE BY MOUTH TWO TIMES DAILY (Patient taking differently: Take 20 mg by mouth 2 (two) times daily before a meal. ) 180 capsule 3  . ONE TOUCH ULTRA TEST test strip CHECK ONCE A DAY 50 each 3  . TRESIBA FLEXTOUCH 100 UNIT/ML SOPN FlexTouch Pen INJECT 24 UNITS TOTAL INTO THE SKIN DAILY AT 10PM (Patient taking differently: Inject  20 Units into the skin at bedtime. ) 15 pen 1  . vitamin B-12 (CYANOCOBALAMIN) 100 MCG tablet Take 100 mcg by mouth daily.    Marland Kitchen glucose blood (ONE TOUCH ULTRA TEST) test strip Check once daily. E11.40 (Patient not taking: Reported on 09/04/2019) 100 each 11   No current facility-administered medications for this visit.    Allergies:   Bactrim, Dilaudid [hydromorphone hcl], Hydrocodone, Invokana [canagliflozin], and Macrodantin    Social History:  The patient  reports that she has never smoked. She has never used smokeless tobacco. She reports that she does not drink alcohol or use drugs.   Family History:  The patient's family history includes Aortic aneurysm  in her mother and sister; Cancer in her mother; Diabetes in her mother; Heart attack in her mother; Hypertension in her mother; Pneumonia in her father; Stroke in her mother.    ROS:  Please see the history of present illness.   Otherwise, review of systems are positive for none.   All other systems are reviewed and negative.    PHYSICAL EXAM: VS:  BP 127/71   Pulse 60   Temp (!) 94.5 F (34.7 C)   Ht 5\' 1"  (1.549 m)   Wt 149 lb 3.2 oz (67.7 kg)   SpO2 98%   BMI 28.19 kg/m  , BMI Body mass index is 28.19 kg/m. GENERAL:  Well appearing WF  In NAD HEENT:  PERRL, EOMI, sclera are clear. Oropharynx is clear. NECK:  No jugular venous distention, carotid upstroke brisk and symmetric, no bruits, no thyromegaly or adenopathy LUNGS:  Clear to auscultation bilaterally CHEST:  Unremarkable HEART:  IRRR,  PMI not displaced or sustained,S1 and S2 within normal limits, no S3, no S4: no clicks, no rubs, no murmurs ABD:  Soft, nontender. BS +, no masses or bruits. No hepatomegaly, no splenomegaly EXT:  2 + pulses throughout, 2+ edema, no cyanosis no clubbing SKIN:  Warm and dry.  No rashes NEURO:  Alert and oriented x 3. Cranial nerves II through XII intact. PSYCH:  Cognitively intact  EKG:  EKG is not ordered today.   Recent  Labs: 12/21/2018: TSH 3.03 03/27/2019: Hemoglobin 9.9; Platelets 181 06/23/2019: ALT 9; BUN 14; Creatinine, Ser 0.77; Potassium 3.8; Sodium 142    Lipid Panel    Component Value Date/Time   CHOL 94 06/23/2019 0825   CHOL 135 04/26/2017 0840   TRIG 70.0 06/23/2019 0825   HDL 35.20 (L) 06/23/2019 0825   HDL 45 04/26/2017 0840   CHOLHDL 3 06/23/2019 0825   VLDL 14.0 06/23/2019 0825   LDLCALC 45 06/23/2019 0825   LDLCALC 70 04/26/2017 0840      Wt Readings from Last 3 Encounters:  09/04/19 149 lb 3.2 oz (67.7 kg)  08/07/19 144 lb (65.3 kg)  06/23/19 153 lb 4.8 oz (69.5 kg)      Lexiscan myoview 05/26/16:Study Highlights    Nuclear stress EF: 72%.  There was no ST segment deviation noted during stress.  This is a low risk study.  The left ventricular ejection fraction is hyperdynamic (>65%).   1. Small, mild mid-anteroseptal perfusion defect.  This defect was actually more intense at rest than with stress.  No ischemia.  Suspect attenuation.  2. EF 72% with normal wall motion.  3. Overall low risk study.     Echo 09/24/17: Study Conclusions  - Left ventricle: The cavity size was normal. Systolic function was   normal. The estimated ejection fraction was in the range of 55%   to 60%. Wall motion was normal; there were no regional wall   motion abnormalities. - Mitral valve: There was mild regurgitation. - Left atrium: The atrium was mildly dilated. - Right atrium: The atrium was mildly dilated. - Atrial septum: No defect or patent foramen ovale was identified. - Pulmonary arteries: Systolic pressure was mildly increased. PA   peak pressure: 37 mm Hg (S).    ASSESSMENT AND PLAN:  1. Atrial fibrillation. Recurrent and persistent. Pulse still regular on exam. [place] vasc score of 4. On Eliquis without missing any doses. Rate controlled on Toprol XL.  S/p TEE guided DCCV in April 2019. Repeat DCCV on April 06, 2019. She is  intolerant of Flecainide. She is clearly  symptomatic with her Afib. Now on amiodarone. Will go ahead and schedule for repeat DCCV on AAD therapy. Check TFTS.   2.  Hypercholesterolemia-  She is at goal. Continue low dose lipitor.   3. Acute on chronic diastolic CHF. More edema since in Afib. Will increase lasix to 40 mg in the am and 20 mg in the pam.  4. Diabetes mellitus- now on insulin.    5. History of Breast CA.   Follow up Norton DCCV.   Signed, Kamarion Zagami Martinique MD, [facility]   09/04/2019 12:09 PM    [place]

## 2019-09-04 ENCOUNTER — Other Ambulatory Visit: Payer: Self-pay | Admitting: Cardiology

## 2019-09-04 ENCOUNTER — Encounter: Payer: Self-pay | Admitting: Cardiology

## 2019-09-04 ENCOUNTER — Other Ambulatory Visit: Payer: Self-pay | Admitting: Family Medicine

## 2019-09-04 ENCOUNTER — Ambulatory Visit: Payer: Medicare Other | Admitting: Cardiology

## 2019-09-04 ENCOUNTER — Other Ambulatory Visit: Payer: Self-pay

## 2019-09-04 VITALS — BP 127/71 | HR 60 | Temp 94.5°F | Ht 61.0 in | Wt 149.2 lb

## 2019-09-04 DIAGNOSIS — E7800 Pure hypercholesterolemia, unspecified: Secondary | ICD-10-CM | POA: Diagnosis not present

## 2019-09-04 DIAGNOSIS — E119 Type 2 diabetes mellitus without complications: Secondary | ICD-10-CM | POA: Diagnosis not present

## 2019-09-04 DIAGNOSIS — I4819 Other persistent atrial fibrillation: Secondary | ICD-10-CM | POA: Diagnosis not present

## 2019-09-04 DIAGNOSIS — I5033 Acute on chronic diastolic (congestive) heart failure: Secondary | ICD-10-CM

## 2019-09-04 NOTE — Patient Instructions (Addendum)
Increase lasix to 40 mg in the morning and 20 mg in the evening       You are scheduled for a Cardioversion on Friday 09/08/19 with Dr.Nelson.  Please arrive at the [facility] (Main Entrance A) at [facility]: [address] [place], [place] [address] at 10:30 am.   DIET: Nothing to eat or drink after midnight except a sip of water with medications (see medication instructions below)  Medication Instructions: Hold Furosemide morning of cardioversion  Continue your anticoagulant: Eliquis You will need to continue your anticoagulant after your procedure until you are told by your  Provider that it is safe to stop   Labs: Today cbc,bmet,tsh and free t4   Covide test Tuesday 3/2/at 10:05 am at [facility] under brick awning pre procedure line   ( Quarantine until after cardioversion )   You must have a responsible person to drive you home and stay in the waiting area during your procedure. Failure to do so could result in cancellation.  Bring your insurance cards.  *Special Note: Every effort is made to have your procedure done on time. Occasionally there are emergencies that occur at the hospital that may cause delays. Please be patient if a delay does occur.     Follow up appointment with Dr.Monicka Cyran Tuesday 09/26/19 at 4:20 pm

## 2019-09-05 ENCOUNTER — Other Ambulatory Visit (HOSPITAL_COMMUNITY)
Admission: RE | Admit: 2019-09-05 | Discharge: 2019-09-05 | Disposition: A | Discharge status: Home / Self Care | Payer: Medicare Other | Source: Ambulatory Visit | Attending: Cardiology | Admitting: Cardiology

## 2019-09-05 DIAGNOSIS — Z01812 Encounter for preprocedural laboratory examination: Secondary | ICD-10-CM | POA: Insufficient documentation

## 2019-09-05 DIAGNOSIS — Z20822 Contact with and (suspected) exposure to covid-19: Secondary | ICD-10-CM | POA: Diagnosis not present

## 2019-09-05 LAB — CBC WITH DIFFERENTIAL/PLATELET
Basophils Absolute: 0 10*3/uL (ref 0.0–0.2)
Basos: 1 %
EOS (ABSOLUTE): 0.1 10*3/uL (ref 0.0–0.4)
Eos: 2 %
Hematocrit: 32.1 % — ABNORMAL LOW (ref 34.0–46.6)
Hemoglobin: 10 g/dL — ABNORMAL LOW (ref 11.1–15.9)
Immature Grans (Abs): 0 10*3/uL (ref 0.0–0.1)
Immature Granulocytes: 1 %
Lymphocytes Absolute: 1.2 10*3/uL (ref 0.7–3.1)
Lymphs: 19 %
MCH: 23.3 pg — ABNORMAL LOW (ref 26.6–33.0)
MCHC: 31.2 g/dL — ABNORMAL LOW (ref 31.5–35.7)
MCV: 75 fL — ABNORMAL LOW (ref 79–97)
Monocytes Absolute: 0.6 10*3/uL (ref 0.1–0.9)
Monocytes: 10 %
Neutrophils Absolute: 4.4 10*3/uL (ref 1.4–7.0)
Neutrophils: 67 %
Platelets: 166 10*3/uL (ref 150–450)
RBC: 4.29 x10E6/uL (ref 3.77–5.28)
RDW: 17.3 % — ABNORMAL HIGH (ref 11.7–15.4)
WBC: 6.3 10*3/uL (ref 3.4–10.8)

## 2019-09-05 LAB — BASIC METABOLIC PANEL
BUN/Creatinine Ratio: 18 (ref 12–28)
BUN: 17 mg/dL (ref 8–27)
CO2: 24 mmol/L (ref 20–29)
Calcium: 9.3 mg/dL (ref 8.7–10.3)
Chloride: 101 mmol/L (ref 96–106)
Creatinine, Ser: 0.92 mg/dL (ref 0.57–1.00)
GFR calc Af Amer: 65 mL/min/{1.73_m2} (ref >59)
GFR calc non Af Amer: 56 mL/min/{1.73_m2} — ABNORMAL LOW (ref >59)
Glucose: 112 mg/dL — ABNORMAL HIGH (ref 65–99)
Potassium: 4.2 mmol/L (ref 3.5–5.2)
Sodium: 141 mmol/L (ref 134–144)

## 2019-09-05 LAB — SARS CORONAVIRUS 2 (TAT 6-24 HRS): SARS Coronavirus 2: NEGATIVE

## 2019-09-05 LAB — TSH+FREE T4
Free T4: 1.92 ng/dL — ABNORMAL HIGH (ref 0.82–1.77)
TSH: 3.21 u[IU]/mL (ref 0.450–4.500)

## 2019-09-08 ENCOUNTER — Ambulatory Visit (HOSPITAL_COMMUNITY): Payer: Medicare Other | Admitting: Anesthesiology

## 2019-09-08 ENCOUNTER — Other Ambulatory Visit: Payer: Self-pay

## 2019-09-08 ENCOUNTER — Ambulatory Visit (HOSPITAL_COMMUNITY)
Admission: RE | Admit: 2019-09-08 | Discharge: 2019-09-08 | Disposition: A | Discharge status: Home / Self Care | Payer: Medicare Other | Source: Ambulatory Visit | Attending: Cardiology | Admitting: Cardiology

## 2019-09-08 ENCOUNTER — Encounter (HOSPITAL_COMMUNITY)
Admission: RE | Disposition: A | Discharge status: Home / Self Care | Payer: Medicare Other | Source: Ambulatory Visit | Attending: Cardiology

## 2019-09-08 DIAGNOSIS — E7800 Pure hypercholesterolemia, unspecified: Secondary | ICD-10-CM | POA: Insufficient documentation

## 2019-09-08 DIAGNOSIS — Z833 Family history of diabetes mellitus: Secondary | ICD-10-CM | POA: Diagnosis not present

## 2019-09-08 DIAGNOSIS — Z881 Allergy status to other antibiotic agents status: Secondary | ICD-10-CM | POA: Diagnosis not present

## 2019-09-08 DIAGNOSIS — Z79899 Other long term (current) drug therapy: Secondary | ICD-10-CM | POA: Insufficient documentation

## 2019-09-08 DIAGNOSIS — Z90721 Acquired absence of ovaries, unilateral: Secondary | ICD-10-CM | POA: Insufficient documentation

## 2019-09-08 DIAGNOSIS — Z6828 Body mass index (BMI) 28.0-28.9, adult: Secondary | ICD-10-CM | POA: Diagnosis not present

## 2019-09-08 DIAGNOSIS — Z853 Personal history of malignant neoplasm of breast: Secondary | ICD-10-CM | POA: Diagnosis not present

## 2019-09-08 DIAGNOSIS — Z794 Long term (current) use of insulin: Secondary | ICD-10-CM | POA: Insufficient documentation

## 2019-09-08 DIAGNOSIS — Z79890 Hormone replacement therapy (postmenopausal): Secondary | ICD-10-CM | POA: Insufficient documentation

## 2019-09-08 DIAGNOSIS — E119 Type 2 diabetes mellitus without complications: Secondary | ICD-10-CM | POA: Diagnosis not present

## 2019-09-08 DIAGNOSIS — Z7901 Long term (current) use of anticoagulants: Secondary | ICD-10-CM | POA: Diagnosis not present

## 2019-09-08 DIAGNOSIS — E039 Hypothyroidism, unspecified: Secondary | ICD-10-CM | POA: Diagnosis not present

## 2019-09-08 DIAGNOSIS — D509 Iron deficiency anemia, unspecified: Secondary | ICD-10-CM | POA: Diagnosis not present

## 2019-09-08 DIAGNOSIS — I4891 Unspecified atrial fibrillation: Secondary | ICD-10-CM | POA: Insufficient documentation

## 2019-09-08 DIAGNOSIS — Z885 Allergy status to narcotic agent status: Secondary | ICD-10-CM | POA: Insufficient documentation

## 2019-09-08 DIAGNOSIS — I4819 Other persistent atrial fibrillation: Secondary | ICD-10-CM | POA: Diagnosis not present

## 2019-09-08 DIAGNOSIS — Z90710 Acquired absence of both cervix and uterus: Secondary | ICD-10-CM | POA: Insufficient documentation

## 2019-09-08 DIAGNOSIS — Z8249 Family history of ischemic heart disease and other diseases of the circulatory system: Secondary | ICD-10-CM | POA: Diagnosis not present

## 2019-09-08 DIAGNOSIS — I5033 Acute on chronic diastolic (congestive) heart failure: Secondary | ICD-10-CM | POA: Insufficient documentation

## 2019-09-08 DIAGNOSIS — R627 Adult failure to thrive: Secondary | ICD-10-CM | POA: Diagnosis not present

## 2019-09-08 DIAGNOSIS — E1140 Type 2 diabetes mellitus with diabetic neuropathy, unspecified: Secondary | ICD-10-CM | POA: Diagnosis not present

## 2019-09-08 DIAGNOSIS — K219 Gastro-esophageal reflux disease without esophagitis: Secondary | ICD-10-CM | POA: Diagnosis not present

## 2019-09-08 HISTORY — PX: CARDIOVERSION: SHX1299

## 2019-09-08 LAB — GLUCOSE, CAPILLARY: Glucose-Capillary: 149 mg/dL — ABNORMAL HIGH (ref 70–99)

## 2019-09-08 SURGERY — CARDIOVERSION
Anesthesia: General

## 2019-09-08 MED ORDER — LIDOCAINE 2% (20 MG/ML) 5 ML SYRINGE
INTRAMUSCULAR | Status: DC | PRN
  Administered 2019-09-08: 60 mg via INTRAVENOUS

## 2019-09-08 MED ORDER — PROPOFOL 10 MG/ML IV BOLUS
INTRAVENOUS | Status: DC | PRN
  Administered 2019-09-08: 50 mg via INTRAVENOUS

## 2019-09-08 MED ORDER — SODIUM CHLORIDE 0.9 % IV SOLN: INTRAVENOUS | Status: DC

## 2019-09-08 NOTE — Discharge Instructions (Signed)
Electrical Cardioversion Electrical cardioversion is the delivery of a jolt of electricity to restore a normal rhythm to the heart. A rhythm that is too fast or is not regular keeps the heart from pumping well. In this procedure, sticky patches or metal paddles are placed on the chest to deliver electricity to the heart from a device.  What can I expect after the procedure?  Your blood pressure, heart rate, breathing rate, and blood oxygen level will be monitored until you leave the hospital or clinic.  Your heart rhythm will be watched to make sure it does not change.  You may have some redness on the skin where the shocks were given.  Follow these instructions at home:  Do not drive for 24 hours if you were given a sedative during your procedure.  Take over-the-counter and prescription medicines only as told by your health care provider.  Ask your health care provider how to check your pulse. Check it often.  Rest for 48 hours after the procedure or as told by your health care provider.  Avoid or limit your caffeine use as told by your health care provider.  Keep all follow-up visits as told by your health care provider. This is important.  Contact a health care provider if:  You feel like your heart is beating too quickly or your pulse is not regular.  You have a serious muscle cramp that does not go away.  Get help right away if:  You have discomfort in your chest.  You are dizzy or you feel faint.  You have trouble breathing or you are short of breath.  Your speech is slurred.  You have trouble moving an arm or leg on one side of your body.  Your fingers or toes turn cold or blue.  Summary  Electrical cardioversion is the delivery of a jolt of electricity to restore a normal rhythm to the heart.  This procedure may be done right away in an emergency or may be a scheduled procedure if the condition is not an emergency.  Generally, this is a safe  procedure.  After the procedure, check your pulse often as told by your health care provider.  This information is not intended to replace advice given to you by your health care provider. Make sure you discuss any questions you have with your health care provider. Document Revised: 01/23/2019 Document Reviewed: 01/23/2019 Elsevier Patient Education  2020 Elsevier Inc.  

## 2019-09-08 NOTE — Anesthesia Postprocedure Evaluation (Signed)
Anesthesia Post Note  Patient: Julie Norton  Procedure(s) Performed: CARDIOVERSION (N/A )     Patient location during evaluation: Endoscopy Anesthesia Type: General Level of consciousness: awake and alert Pain management: pain level controlled Vital Signs Assessment: post-procedure vital signs reviewed and stable Respiratory status: spontaneous breathing, nonlabored ventilation, respiratory function stable and patient connected to nasal cannula oxygen Cardiovascular status: blood pressure returned to baseline and stable Postop Assessment: no apparent nausea or vomiting Anesthetic complications: no    Last Vitals:  Vitals:   09/08/19 1204 09/08/19 1205  BP:  (!) 127/55  Pulse: (!) 58 (!) 57  Resp: (!) 22 20  Temp:    SpO2: 97% 97%    Last Pain:  Vitals:   09/08/19 1144  TempSrc: Oral  PainSc:                  Barnet Glasgow

## 2019-09-08 NOTE — Anesthesia Preprocedure Evaluation (Addendum)
Anesthesia Evaluation  Patient identified by MRN, date of birth, ID band Patient awake    Reviewed: Allergy & Precautions, NPO status , Patient's Chart, lab work & pertinent test results  History of Anesthesia Complications (+) PONV  Airway Mallampati: II  TM Distance: >3 FB Neck ROM: Full    Dental no notable dental hx. (+) Teeth Intact, Dental Advisory Given   Pulmonary neg pulmonary ROS,    Pulmonary exam normal breath sounds clear to auscultation       Cardiovascular negative cardio ROS Normal cardiovascular exam+ dysrhythmias Atrial Fibrillation  Rhythm:Irregular Rate:Abnormal     Neuro/Psych  Neuromuscular disease negative psych ROS   GI/Hepatic Neg liver ROS, GERD  ,  Endo/Other  diabetesHypothyroidism   Renal/GU      Musculoskeletal   Abdominal   Peds  Hematology negative hematology ROS (+) anemia ,   Anesthesia Other Findings   Reproductive/Obstetrics                            Anesthesia Physical Anesthesia Plan  ASA: III  Anesthesia Plan: General   Post-op Pain Management:    Induction:   PONV Risk Score and Plan: Treatment may vary due to age or medical condition  Airway Management Planned: Natural Airway and Nasal Cannula  Additional Equipment: None  Intra-op Plan:   Post-operative Plan:   Informed Consent: I have reviewed the patients History and Physical, chart, labs and discussed the procedure including the risks, benefits and alternatives for the proposed anesthesia with the patient or authorized representative who has indicated his/her understanding and acceptance.     Dental advisory given  Plan Discussed with: CRNA  Anesthesia Plan Comments:        Anesthesia Quick Evaluation

## 2019-09-08 NOTE — Transfer of Care (Signed)
Immediate Anesthesia Transfer of Care Note  Patient: Julie Norton  Procedure(s) Performed: CARDIOVERSION (N/A )  Patient Location: Endoscopy Unit  Anesthesia Type:General  Level of Consciousness: drowsy and patient cooperative  Airway & Oxygen Therapy: Patient Spontanous Breathing  Post-op Assessment: Report given to RN, Post -op Vital signs reviewed and stable and Patient moving all extremities X 4  Post vital signs: Reviewed and stable  Last Vitals:  Vitals Value Taken Time  BP    Temp    Pulse    Resp    SpO2      Last Pain:  Vitals:   09/08/19 1045  TempSrc: Oral  PainSc: 0-No pain         Complications: No apparent anesthesia complications

## 2019-09-08 NOTE — Interval H&P Note (Signed)
History and Physical Interval Note:  09/08/2019 10:54 AM  Julie Norton  has presented today for surgery, with the diagnosis of AFIB.  The various methods of treatment have been discussed with the patient and family. After consideration of risks, benefits and other options for treatment, the patient has consented to  Procedure(s): CARDIOVERSION (N/A) as a surgical intervention.  The patient's history has been reviewed, patient examined, no change in status, stable for surgery.  I have reviewed the patient's chart and labs.  Questions were answered to the patient's satisfaction.     [name]

## 2019-09-08 NOTE — CV Procedure (Signed)
   Cardioversion Note  Julie Norton [id number] 1929-12-03  Procedure: DC Cardioversion Indications: atrial fibrillation  Procedure Details Consent: Obtained Time Out: Verified patient identification, verified procedure, site/side was marked, verified correct patient position, special equipment/implants available, Radiology Safety Procedures followed,  medications/allergies/relevent history reviewed, required imaging and test results available.  Performed  The patient has been on adequate anticoagulation.  The patient received IV propofol administered by anesthesia staff for sedation.  Synchronous cardioversion was performed at 120 joules.  The cardioversion was successful.  Complications: No apparent complications Patient did tolerate procedure well.  Julie Dawley, MD, [facility] 09/08/2019, 11:36 AM

## 2019-09-10 ENCOUNTER — Other Ambulatory Visit: Payer: Self-pay | Admitting: Family Medicine

## 2019-09-10 ENCOUNTER — Other Ambulatory Visit: Payer: Self-pay | Admitting: Cardiology

## 2019-09-11 ENCOUNTER — Other Ambulatory Visit: Payer: Self-pay

## 2019-09-11 ENCOUNTER — Telehealth: Payer: Self-pay

## 2019-09-11 MED ORDER — AMIODARONE HCL 100 MG PO TABS: 100.0000 mg | ORAL_TABLET | Freq: Every day | ORAL | 6 refills | Status: DC

## 2019-09-11 NOTE — Telephone Encounter (Signed)
OK to continue this medication? 

## 2019-09-11 NOTE — Telephone Encounter (Signed)
The nausea could be related to anesthesia or possibly to amiodarone. The chest pain sounds more musculoskeletal so I would just try Tylenol. Let's go ahead and reduce amiodarone to 300 mg daily for now.  Julie Norton Martin[place] MD, [facility]

## 2019-09-11 NOTE — Telephone Encounter (Signed)
Spoke to patient about email sent today.Dr.Jordan advised nausea could be recent anesthesia or amiodarone.He advised to decrease amiodarone to 300 mg daily.Advised chest pain sounded musculoskeletal.Advised to take Tylenol as needed.Advised to call back if she continues to have nausea.

## 2019-09-11 NOTE — Telephone Encounter (Signed)
Refill for 6 months. 

## 2019-09-12 ENCOUNTER — Telehealth: Payer: Self-pay

## 2019-09-12 NOTE — Telephone Encounter (Signed)
Spoke to patient she stated she has felt alittle better today.Stated she burps after she eats.She was up most of the morning and her chest aches.She forgot to take Tylenol.Advised to take 2 Tylenol.Advised to keep appointment with Dr.Jordan as planned 09/26/19.Advised to call sooner if needed.

## 2019-09-21 NOTE — Progress Notes (Signed)
Cardiology Office Note   Date:  09/26/2019   ID:  Julie Norton, DOB [birth year], MRN [id number]  PCP:  Eulas Post, MD  Cardiologist: Lorine Iannaccone Martinique MD  Chief Complaint  Patient presents with  . Congestive Heart Failure  . Atrial Fibrillation      History of Present Illness: Julie Norton is a 86 y.o. female who presents for follow up of Atrial fibrillation.   She has a past history of hypercholesterolemia. She also has a history of DM on  insulin. Echo in 2015 showed mild MR with normal LV function. She had a normal Myoview study in November 2017.   She did undergo GI evaluation in September 2018  for iron deficiency anemia. She had a small hiatal hernia and diverticulosis. One small rectal polyp.   She was seen by Dr. Elease Hashimoto on 09/20/17. Found to be in Afib with rate 95. Started on Eliquis. Follow up Echo showed mild biatrial enlargement and normal LV function. She  noted some fatigue, decreased energy and leg swelling. Some dyspnea and chest tightness. Was started on lasix with improvement of these symptoms. She subsequently underwent TEE guided DCCV on 10/28/17 with return to NSR.   Was seen in early September with  increased leg swelling for 2 months. Noted SOB going up stairs. Was found to be in Afib with rate 105. Toprol XL was added for rate control. Lasix was increased. Since she had missed a couple of Eliquis doses she DCCV was deferred until seen back today. We did try her on Flecainide but this resulted in severe nausea and diarrhea and was discontinued.   She was seen in the ED on 03/27/19 with complaints of epigastric pain/abdominal pain. Mildly elevated lipase. Other labs OK. Ecg in Afib with rate 80. CXR and CT abdomen were normal.  She subsequently underwent successful DCCV on 04/06/19. Seen back on October 27 and was in NSR. Dr Burchette's note of Dec 18 thought she was back in Afib but no Ecg done. Rate controlled. When seen last month she  noted more  leg edema and fatigue. Mild SOB.  She was started on amiodarone  at 400 mg daily. She underwent repeat DCCV on March 5 successfully. Due to some nausea amiodarone was reduced to 300 mg daily.   On follow up today she is maintaining NSR. She does note nausea and epigastric pain are better on lower dose of amiodarone but not completely resolved. She also notes a change in her balance and taste. Not sleeping well and mild memory impairment. She does have a problem pain in her right hip area for which she gets periodic injections.    Past Medical History:  Diagnosis Date  . Abdominal adhesions   . Abdominal gas pain    suspected -- may be related to intermittent right upper quadrant discomfort radiating around her back      . Breast cancer ([place])   . Breast mass    left breast  . Cancer ([place])    left breast  . Chest pain    intermittent right upper quadrant discomfort radiating around her back      . Complication of anesthesia   . Diabetes mellitus without complication ([place])    Type II  . Dyspepsia   . GERD (gastroesophageal reflux disease)   . H/O: hysterectomy 1978  . Hypercholesteremia   . Hypothyroidism   . Personal history of radiation therapy   . Pneumonia   . PONV (postoperative nausea  and vomiting)   . Tendinitis    of the right arm and shoulder  . Torn tendon    right shoulder    Past Surgical History:  Procedure Laterality Date  . ABDOMINAL HYSTERECTOMY    . APPENDECTOMY  1978  . BLADDER REPAIR    . BREAST LUMPECTOMY Left 02/24/2011   central partial mastectomy - dr Margot Chimes  . BREAST REDUCTION SURGERY    . CARDIOVERSION N/A 10/28/2017   Procedure: CARDIOVERSION;  Surgeon: Lelon Perla, MD;  Location: [facility] ENDOSCOPY;  Service: Cardiovascular;  Laterality: N/A;  . CARDIOVERSION N/A 04/06/2019   Procedure: CARDIOVERSION;  Surgeon: [name], MD;  Location: [facility] ENDOSCOPY;  Service: Cardiovascular;  Laterality: N/A;  . CARDIOVERSION N/A 09/08/2019   Procedure:  CARDIOVERSION;  Surgeon: Dorothy Spark, MD;  Location: [place];  Service: Cardiovascular;  Laterality: N/A;  . COLONOSCOPY WITH PROPOFOL N/A 03/24/2017   Procedure: COLONOSCOPY WITH PROPOFOL;  Surgeon: Wilford Corner, MD;  Location: [place];  Service: Endoscopy;  Laterality: N/A;  . ESOPHAGOGASTRODUODENOSCOPY (EGD) WITH PROPOFOL N/A 03/24/2017   Procedure: ESOPHAGOGASTRODUODENOSCOPY (EGD) WITH PROPOFOL;  Surgeon: Wilford Corner, MD;  Location: [place];  Service: Endoscopy;  Laterality: N/A;  . LIPOMA EXCISION Right   . REDUCTION MAMMAPLASTY Bilateral 1998  . RETINAL DETACHMENT SURGERY Left   . SALPINGOOPHORECTOMY    . TEE WITHOUT CARDIOVERSION N/A 10/28/2017   Procedure: TRANSESOPHAGEAL ECHOCARDIOGRAM (TEE);  Surgeon: Lelon Perla, MD;  Location: [facility] ENDOSCOPY;  Service: Cardiovascular;  Laterality: N/A;  . TONSILLECTOMY AND ADENOIDECTOMY  1950   at 86 years of age     Current Outpatient Medications  Medication Sig Dispense Refill  . acetaminophen (TYLENOL) 500 MG tablet Take 1,500 mg by mouth every 6 (six) hours as needed for mild pain or moderate pain.    Marland Kitchen amiodarone (PACERONE) 200 MG tablet Take 1 tablet (200 mg total) by mouth daily. 90 tablet 3  . apixaban (ELIQUIS) 5 MG TABS tablet Take 1 tablet (5 mg total) by mouth 2 (two) times daily. 180 tablet 3  . atorvastatin (LIPITOR) 10 MG tablet TAKE ONE-HALF TABLET BY  MOUTH EVERY OTHER DAY OR AS DIRECTED (Patient taking differently: Take 5 mg by mouth every other day. At night.) 21 tablet 3  . [employer] D3 125 MCG (5000 UT) capsule TAKE 1 CAPSULE BY MOUTH EVERY DAY (Patient taking differently: Take 5,000 Units by mouth daily. ) 30 capsule 3  . furosemide (LASIX) 40 MG tablet Take 1 tablet (40 mg total) by mouth daily. 90 tablet 3  . glucose blood (ONE TOUCH ULTRA TEST) test strip Check once daily. E11.40 100 each 11  . Insulin Pen Needle 29G X 5MM MISC Use once daily 100 each 3  . levothyroxine (SYNTHROID) 50 MCG  tablet TAKE 1 TABLET BY MOUTH  DAILY (Patient taking differently: Take 50 mcg by mouth daily before breakfast. TAKE 1 TABLET BY MOUTH  DAILY) 90 tablet 1  . loratadine (CLARITIN) 10 MG tablet Take 10 mg by mouth daily as needed for allergies.    . metFORMIN (GLUCOPHAGE-XR) 500 MG 24 hr tablet TAKE 1 TABLET BY MOUTH  TWICE DAILY (Patient taking differently: Take 500 mg by mouth in the morning and at bedtime. ) 180 tablet 1  . metoprolol succinate (TOPROL XL) 25 MG 24 hr tablet Take 1 tablet (25 mg total) by mouth daily. 90 tablet 3  . omeprazole (PRILOSEC) 20 MG capsule TAKE 1 CAPSULE BY MOUTH TWO TIMES DAILY (Patient taking differently: Take 20  mg by mouth in the morning and at bedtime. ) 180 capsule 3  . ONE TOUCH ULTRA TEST test strip CHECK ONCE A DAY 50 each 3  . traZODone (DESYREL) 50 MG tablet TAKE 1/2 TO 1 TABLET BY MOUTH AT BEDTIME AS NEEDED FOR SLEEP 90 tablet 1  . TRESIBA FLEXTOUCH 100 UNIT/ML SOPN FlexTouch Pen INJECT 24 UNITS TOTAL INTO THE SKIN DAILY AT 10PM (Patient taking differently: Inject 17 Units into the skin at bedtime. ) 15 pen 1  . vitamin B-12 (CYANOCOBALAMIN) 1000 MCG tablet Take 1,000 mcg by mouth daily.     No current facility-administered medications for this visit.    Allergies:   Bactrim, Dilaudid [hydromorphone hcl], Hydrocodone, Invokana [canagliflozin], and Macrodantin    Social History:  The patient  reports that she has never smoked. She has never used smokeless tobacco. She reports that she does not drink alcohol or use drugs.   Family History:  The patient's family history includes Aortic aneurysm in her mother and sister; Cancer in her mother; Diabetes in her mother; Heart attack in her mother; Hypertension in her mother; Pneumonia in her father; Stroke in her mother.    ROS:  Please see the history of present illness.   Otherwise, review of systems are positive for none.   All other systems are reviewed and negative.    PHYSICAL EXAM: VS:  BP (!) 142/68    Pulse 64   Ht 5' 1.5" (1.562 m)   Wt 145 lb 12.8 oz (66.1 kg)   BMI 27.10 kg/m  , BMI Body mass index is 27.1 kg/m. GENERAL:  Well appearing WF  In NAD HEENT:  PERRL, EOMI, sclera are clear. Oropharynx is clear. NECK:  No jugular venous distention, carotid upstroke brisk and symmetric, no bruits, no thyromegaly or adenopathy LUNGS:  Clear to auscultation bilaterally CHEST:  Unremarkable HEART:  RRR,  PMI not displaced or sustained,S1 and S2 within normal limits, no S3, no S4: no clicks, no rubs, no murmurs ABD:  Soft, nontender. BS +, no masses or bruits. No hepatomegaly, no splenomegaly EXT:  2 + pulses throughout, 2+ edema, no cyanosis no clubbing SKIN:  Warm and dry.  No rashes NEURO:  Alert and oriented x 3. Cranial nerves II through XII intact. PSYCH:  Cognitively intact  EKG:  EKG is  ordered today. NSR rate 64. First degree AV block, RBBB. Nonspecific TWA. I have personally reviewed and interpreted this study.   Recent Labs: 06/23/2019: ALT 9 09/04/2019: BUN 17; Creatinine, Ser 0.92; Hemoglobin 10.0; Platelets 166; Potassium 4.2; Sodium 141; TSH 3.210    Lipid Panel    Component Value Date/Time   CHOL 94 06/23/2019 0825   CHOL 135 04/26/2017 0840   TRIG 70.0 06/23/2019 0825   HDL 35.20 (L) 06/23/2019 0825   HDL 45 04/26/2017 0840   CHOLHDL 3 06/23/2019 0825   VLDL 14.0 06/23/2019 0825   LDLCALC 45 06/23/2019 0825   LDLCALC 70 04/26/2017 0840      Wt Readings from Last 3 Encounters:  09/26/19 145 lb 12.8 oz (66.1 kg)  09/04/19 149 lb 3.2 oz (67.7 kg)  08/07/19 144 lb (65.3 kg)      Lexiscan myoview 05/26/16:Study Highlights    Nuclear stress EF: 72%.  There was no ST segment deviation noted during stress.  This is a low risk study.  The left ventricular ejection fraction is hyperdynamic (>65%).   1. Small, mild mid-anteroseptal perfusion defect.  This defect was actually more intense at  rest than with stress.  No ischemia.  Suspect attenuation.  2. EF  72% with normal wall motion.  3. Overall low risk study.     Echo 09/24/17: Study Conclusions  - Left ventricle: The cavity size was normal. Systolic function was   normal. The estimated ejection fraction was in the range of 55%   to 60%. Wall motion was normal; there were no regional wall   motion abnormalities. - Mitral valve: There was mild regurgitation. - Left atrium: The atrium was mildly dilated. - Right atrium: The atrium was mildly dilated. - Atrial septum: No defect or patent foramen ovale was identified. - Pulmonary arteries: Systolic pressure was mildly increased. PA   peak pressure: 37 mm Hg (S).    ASSESSMENT AND PLAN:  1. Atrial fibrillation. Recurrent and persistent.  Mali vasc score of 4. On Eliquis. Rate controlled on Toprol XL.  S/p TEE guided DCCV in April 2019. Repeat DCCV on April 06, 2019 with ERAD. She was intolerant of Flecainide. She was symptomatic with her Afib so was started on amiodarone. S/p  successful repeat DCCV on September 08, 2019. She is maintaining NSR so far. I am concerned that she is having side effects on amiodarone. Will hopefully improve with dose reduction. Will go ahead and reduce dose to 200 mg daily. Follow up in 3 months with CMET and TFTs.   2.  Hypercholesterolemia-  She is at goal. Continue low dose lipitor.   3. Acute on chronic diastolic CHF. Edema and weight are down.  Will reduce  lasix to 40 mg in the am and stop 20 mg in pm.  4. Diabetes mellitus- now on insulin.    5. History of Breast CA.     Signed, Lana Flaim Martinique MD, [facility]   09/26/2019 4:55 PM    [place]

## 2019-09-26 ENCOUNTER — Other Ambulatory Visit: Payer: Self-pay

## 2019-09-26 ENCOUNTER — Encounter: Payer: Self-pay | Admitting: Cardiology

## 2019-09-26 ENCOUNTER — Other Ambulatory Visit: Payer: Self-pay | Admitting: Family Medicine

## 2019-09-26 ENCOUNTER — Ambulatory Visit: Payer: Medicare Other | Admitting: Cardiology

## 2019-09-26 VITALS — BP 142/68 | HR 64 | Ht 61.5 in | Wt 145.8 lb

## 2019-09-26 DIAGNOSIS — I4510 Unspecified right bundle-branch block: Secondary | ICD-10-CM | POA: Diagnosis not present

## 2019-09-26 DIAGNOSIS — E0590 Thyrotoxicosis, unspecified without thyrotoxic crisis or storm: Secondary | ICD-10-CM

## 2019-09-26 DIAGNOSIS — I4819 Other persistent atrial fibrillation: Secondary | ICD-10-CM

## 2019-09-26 DIAGNOSIS — I5033 Acute on chronic diastolic (congestive) heart failure: Secondary | ICD-10-CM | POA: Diagnosis not present

## 2019-09-26 NOTE — Patient Instructions (Signed)
Reduce amiodarone to 200 mg daily  Reduce lasix to 40 mg daily  We will follow up in 3 months with lab work

## 2019-10-11 ENCOUNTER — Telehealth: Payer: Self-pay | Admitting: Family Medicine

## 2019-10-11 NOTE — Telephone Encounter (Signed)
Pt has been notified.

## 2019-10-11 NOTE — Telephone Encounter (Addendum)
Patient would like to know what Covid vaccine brand Dr. 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 Please advise [phone]

## 2019-10-11 NOTE — Telephone Encounter (Signed)
either [employer] or [employer]

## 2019-10-11 NOTE — Telephone Encounter (Signed)
please advise.

## 2019-10-18 DIAGNOSIS — I4891 Unspecified atrial fibrillation: Secondary | ICD-10-CM | POA: Diagnosis not present

## 2019-10-18 DIAGNOSIS — E109 Type 1 diabetes mellitus without complications: Secondary | ICD-10-CM | POA: Diagnosis not present

## 2019-10-18 DIAGNOSIS — M5136 Other intervertebral disc degeneration, lumbar region: Secondary | ICD-10-CM | POA: Diagnosis not present

## 2019-10-26 ENCOUNTER — Telehealth: Payer: Self-pay | Admitting: Family Medicine

## 2019-10-26 NOTE — Telephone Encounter (Signed)
Pt stated she thinks she has a bladder infection and would like to know if she can come in and do labs, leave a sample, or she asked if she has to have an appt with PCP first?   Her symptoms are frequent urination, burning when urinating, and when starting to urinate she feels tingles down both arms. She states she has had a lot of bladder infections over the years and has the same symptoms each time.   Pt can be reached at [phone] (cell) or [phone] (home)

## 2019-10-26 NOTE — Telephone Encounter (Signed)
Spoke with the pt and informed her a visit is needed.  I offered a virtual visit with Dr Maudie Mercury today as Dr Elease Hashimoto is out of the office. Patient declined appt today due to other problems and an appt was scheduled for tomorrow with PCP at 10am.

## 2019-10-27 ENCOUNTER — Ambulatory Visit (INDEPENDENT_AMBULATORY_CARE_PROVIDER_SITE_OTHER): Payer: Medicare Other | Admitting: Family Medicine

## 2019-10-27 ENCOUNTER — Encounter: Payer: Self-pay | Admitting: Family Medicine

## 2019-10-27 ENCOUNTER — Other Ambulatory Visit: Payer: Self-pay

## 2019-10-27 VITALS — BP 158/74 | HR 66 | Temp 97.6°F | Wt 136.9 lb

## 2019-10-27 DIAGNOSIS — R350 Frequency of micturition: Secondary | ICD-10-CM

## 2019-10-27 DIAGNOSIS — N3000 Acute cystitis without hematuria: Secondary | ICD-10-CM | POA: Diagnosis not present

## 2019-10-27 DIAGNOSIS — E1165 Type 2 diabetes mellitus with hyperglycemia: Secondary | ICD-10-CM | POA: Diagnosis not present

## 2019-10-27 LAB — POCT URINALYSIS DIPSTICK
Bilirubin, UA: NEGATIVE
Glucose, UA: NEGATIVE
Ketones, UA: NEGATIVE
Nitrite, UA: POSITIVE
Protein, UA: POSITIVE — AB
Spec Grav, UA: 1.025 (ref 1.010–1.025)
Urobilinogen, UA: 1 E.U./dL
pH, UA: 6 (ref 5.0–8.0)

## 2019-10-27 LAB — POCT GLYCOSYLATED HEMOGLOBIN (HGB A1C): Hemoglobin A1C: 8.3 % — AB (ref 4.0–5.6)

## 2019-10-27 MED ORDER — CEPHALEXIN 500 MG PO CAPS: 500.0000 mg | ORAL_CAPSULE | Freq: Three times a day (TID) | ORAL | 0 refills | Status: DC

## 2019-10-27 NOTE — Progress Notes (Signed)
Subjective:     Patient ID: Julie Norton, female   DOB: Dec 20, 1929, 86 y.o.   MRN: [id number]  HPI [name] is seen for the following issues  She has had several days now of some urine frequency and burning with urination.  No flank pain.  No fevers or chills.  No nausea or vomiting.  She has had frequent UTI in the past though none recently.  Keeping down fluids well.  She has history of intolerance/allergy to Macrobid and Bactrim.  Type 2 diabetes.  She has had some recent epidural injections and is concerned her blood sugars may be up.  She has had some recent fasting sugars over 140.  She has titrated her [name] insulin up to current dose of 20 units daily.  She remains on Metformin.  Last A1c was 8.0%  She feels her balance is off.  She is followed by orthopedics regarding her chronic back issues.  She has had physical therapy in the past.  No recent falls.  No vertigo.  No syncope.  Past Medical History:  Diagnosis Date  . Abdominal adhesions   . Abdominal gas pain    suspected -- may be related to intermittent right upper quadrant discomfort radiating around her back      . Breast cancer ([place])   . Breast mass    left breast  . Cancer ([place])    left breast  . Chest pain    intermittent right upper quadrant discomfort radiating around her back      . Complication of anesthesia   . Diabetes mellitus without complication ([place])    Type II  . Dyspepsia   . GERD (gastroesophageal reflux disease)   . H/O: hysterectomy 1978  . Hypercholesteremia   . Hypothyroidism   . Personal history of radiation therapy   . Pneumonia   . PONV (postoperative nausea and vomiting)   . Tendinitis    of the right arm and shoulder  . Torn tendon    right shoulder   Past Surgical History:  Procedure Laterality Date  . ABDOMINAL HYSTERECTOMY    . APPENDECTOMY  1978  . BLADDER REPAIR    . BREAST LUMPECTOMY Left 02/24/2011   central partial mastectomy - dr Margot Chimes  . BREAST REDUCTION  SURGERY    . CARDIOVERSION N/A 10/28/2017   Procedure: CARDIOVERSION;  Surgeon: Lelon Perla, MD;  Location: [facility] ENDOSCOPY;  Service: Cardiovascular;  Laterality: N/A;  . CARDIOVERSION N/A 04/06/2019   Procedure: CARDIOVERSION;  Surgeon: [name], MD;  Location: [facility] ENDOSCOPY;  Service: Cardiovascular;  Laterality: N/A;  . CARDIOVERSION N/A 09/08/2019   Procedure: CARDIOVERSION;  Surgeon: Dorothy Spark, MD;  Location: [place];  Service: Cardiovascular;  Laterality: N/A;  . COLONOSCOPY WITH PROPOFOL N/A 03/24/2017   Procedure: COLONOSCOPY WITH PROPOFOL;  Surgeon: Wilford Corner, MD;  Location: [place];  Service: Endoscopy;  Laterality: N/A;  . ESOPHAGOGASTRODUODENOSCOPY (EGD) WITH PROPOFOL N/A 03/24/2017   Procedure: ESOPHAGOGASTRODUODENOSCOPY (EGD) WITH PROPOFOL;  Surgeon: Wilford Corner, MD;  Location: [place];  Service: Endoscopy;  Laterality: N/A;  . LIPOMA EXCISION Right   . REDUCTION MAMMAPLASTY Bilateral 1998  . RETINAL DETACHMENT SURGERY Left   . SALPINGOOPHORECTOMY    . TEE WITHOUT CARDIOVERSION N/A 10/28/2017   Procedure: TRANSESOPHAGEAL ECHOCARDIOGRAM (TEE);  Surgeon: Lelon Perla, MD;  Location: [facility] ENDOSCOPY;  Service: Cardiovascular;  Laterality: N/A;  . [place]   at 86 years of age    reports that she has  never smoked. She has never used smokeless tobacco. She reports that she does not drink alcohol or use drugs. family history includes Aortic aneurysm in her mother and sister; Cancer in her mother; Diabetes in her mother; Heart attack in her mother; Hypertension in her mother; Pneumonia in her father; Stroke in her mother. Allergies  Allergen Reactions  . Bactrim Nausea And Vomiting  . Dilaudid [Hydromorphone Hcl] Other (See Comments)    Unknown  . Hydrocodone Nausea And Vomiting  . Invokana [Canagliflozin] Itching and Other (See Comments)    Yeast Infections   . Macrodantin Nausea And Vomiting        Review of Systems  Constitutional: Positive for fatigue.  Respiratory: Negative for cough, chest tightness, shortness of breath and wheezing.   Cardiovascular: Negative for chest pain, palpitations and leg swelling.  Genitourinary: Positive for dysuria and frequency. Negative for flank pain and hematuria.  Musculoskeletal: Positive for back pain.  Neurological: Negative for seizures, syncope, light-headedness and headaches.       Objective:   Physical Exam Vitals reviewed.  Constitutional:      Appearance: Normal appearance.  Cardiovascular:     Rate and Rhythm: Normal rate and regular rhythm.  Pulmonary:     Effort: Pulmonary effort is normal.     Breath sounds: Normal breath sounds.  Musculoskeletal:     Right lower leg: No edema.     Left lower leg: No edema.  Neurological:     Mental Status: She is alert.        Assessment:     #1 dysuria.  Urine dipstick highly suggests UTI with positive leukocytes and nitrites along with trace blood.  Patient nontoxic in appearance  #2 type 2 diabetes poorly controlled with A1c 8.3%.  Recent epidural injection which may have exacerbated somewhat    Plan:     -Urine culture sent and stay well-hydrated  -Start Keflex 500 mg 3 times daily for 5 days pending culture results  -Titrate Tresiba up to 22 units daily and continue titration every few days for fasting blood sugars consistently greater than 130.  -Recommend routine follow-up in 3 months  Eulas Post MD [facility] at [facility]

## 2019-10-27 NOTE — Patient Instructions (Signed)

## 2019-10-29 LAB — URINE CULTURE
MICRO NUMBER:: 10399613
SPECIMEN QUALITY:: ADEQUATE

## 2019-10-30 ENCOUNTER — Other Ambulatory Visit: Payer: Self-pay

## 2019-10-30 MED ORDER — CIPROFLOXACIN HCL 500 MG PO TABS
500.0000 mg | ORAL_TABLET | Freq: Two times a day (BID) | ORAL | 0 refills | Status: DC
Start: 2019-10-30 — End: 2020-02-14

## 2019-11-10 ENCOUNTER — Telehealth: Payer: Self-pay | Admitting: Family Medicine

## 2019-11-10 NOTE — Chronic Care Management (AMB) (Signed)
  Chronic Care Management   Note  11/10/2019 Name: Julie Norton MRN: [id number] DOB: 26-Nov-1929  Julie Norton is a 86 y.o. year old female who is a primary care patient of Burchette, Alinda Sierras, MD. I reached out to Julie Norton by phone today in response to a referral sent by Ms. Burnis Medin PCP, Eulas Post, MD.   Ms. Barman was given information about Chronic Care Management services today including:  1. CCM service includes personalized support from designated clinical staff supervised by her physician, including individualized plan of care and coordination with other care providers 2. 24/7 contact phone numbers for assistance for urgent and routine care needs. 3. Service will only be billed when office clinical staff spend 20 minutes or more in a month to coordinate care. 4. Only one practitioner may furnish and bill the service in a calendar month. 5. The patient may stop CCM services at any time (effective at the end of the month) by phone call to the office staff.   Patient agreed to services and verbal consent obtained.   Follow up plan:   [place]

## 2019-11-13 ENCOUNTER — Ambulatory Visit (INDEPENDENT_AMBULATORY_CARE_PROVIDER_SITE_OTHER): Payer: Medicare Other | Admitting: Family Medicine

## 2019-11-13 ENCOUNTER — Other Ambulatory Visit: Payer: Self-pay

## 2019-11-13 VITALS — BP 116/60 | HR 66 | Temp 97.6°F | Wt 139.0 lb

## 2019-11-13 DIAGNOSIS — H02403 Unspecified ptosis of bilateral eyelids: Secondary | ICD-10-CM | POA: Diagnosis not present

## 2019-11-13 DIAGNOSIS — Z9181 History of falling: Secondary | ICD-10-CM

## 2019-11-13 DIAGNOSIS — E1165 Type 2 diabetes mellitus with hyperglycemia: Secondary | ICD-10-CM | POA: Diagnosis not present

## 2019-11-13 DIAGNOSIS — R2689 Other abnormalities of gait and mobility: Secondary | ICD-10-CM | POA: Diagnosis not present

## 2019-11-13 NOTE — Patient Instructions (Signed)

## 2019-11-13 NOTE — Progress Notes (Signed)
Subjective:     Patient ID: Julie Norton, female   DOB: [birth year], 87 y.o.   MRN: [id number]  HPI Julie Norton has history of atrial fibrillation, hypothyroidism, type 2 diabetes, peripheral neuropathy, history of breast cancer, hyperlipidemia.  She is seen today to discuss the following issues  She relates feeling "off balance "has been present for several weeks.  She did have a fall February 14.  She hit her head but there was no loss of consciousness.  She has not had consistent headaches since then.  No nausea or vomiting.  Denies any vertigo symptoms.  She has some mild fear factors for falling and feels less balance when making transfers.  Type 2 diabetes.  Last A1c 8.3 percent.  Patient has made some dietary changes since then.  She feels her blood sugars are improved.  She has blepharoptosis bilaterally and this is starting to affect her vision more.  She would like to see vision specialist who does blepharoplasty.  She is apparently consulting with her ophthalmologist regarding this.  She has chronic back pain which sometimes makes sleep difficult.  She feels this is affecting her ambulation somewhat as well.  Past Medical History:  Diagnosis Date  . Abdominal adhesions   . Abdominal gas pain    suspected -- may be related to intermittent right upper quadrant discomfort radiating around her back      . Breast cancer ([place])   . Breast mass    left breast  . Cancer ([place])    left breast  . Chest pain    intermittent right upper quadrant discomfort radiating around her back      . Complication of anesthesia   . Diabetes mellitus without complication ([place])    Type II  . Dyspepsia   . GERD (gastroesophageal reflux disease)   . H/O: hysterectomy 1978  . Hypercholesteremia   . Hypothyroidism   . Personal history of radiation therapy   . Pneumonia   . PONV (postoperative nausea and vomiting)   . Tendinitis    of the right arm and shoulder  . Torn tendon    right shoulder    Past Surgical History:  Procedure Laterality Date  . ABDOMINAL HYSTERECTOMY    . APPENDECTOMY  1978  . BLADDER REPAIR    . BREAST LUMPECTOMY Left 02/24/2011   central partial mastectomy - dr Margot Chimes  . BREAST REDUCTION SURGERY    . CARDIOVERSION N/A 10/28/2017   Procedure: CARDIOVERSION;  Surgeon: Lelon Perla, MD;  Location: [facility] ENDOSCOPY;  Service: Cardiovascular;  Laterality: N/A;  . CARDIOVERSION N/A 04/06/2019   Procedure: CARDIOVERSION;  Surgeon: [name], MD;  Location: [facility] ENDOSCOPY;  Service: Cardiovascular;  Laterality: N/A;  . CARDIOVERSION N/A 09/08/2019   Procedure: CARDIOVERSION;  Surgeon: Dorothy Spark, MD;  Location: [place];  Service: Cardiovascular;  Laterality: N/A;  . COLONOSCOPY WITH PROPOFOL N/A 03/24/2017   Procedure: COLONOSCOPY WITH PROPOFOL;  Surgeon: Wilford Corner, MD;  Location: [place];  Service: Endoscopy;  Laterality: N/A;  . ESOPHAGOGASTRODUODENOSCOPY (EGD) WITH PROPOFOL N/A 03/24/2017   Procedure: ESOPHAGOGASTRODUODENOSCOPY (EGD) WITH PROPOFOL;  Surgeon: Wilford Corner, MD;  Location: [place];  Service: Endoscopy;  Laterality: N/A;  . LIPOMA EXCISION Right   . REDUCTION MAMMAPLASTY Bilateral 1998  . RETINAL DETACHMENT SURGERY Left   . SALPINGOOPHORECTOMY    . TEE WITHOUT CARDIOVERSION N/A 10/28/2017   Procedure: TRANSESOPHAGEAL ECHOCARDIOGRAM (TEE);  Surgeon: Lelon Perla, MD;  Location: [facility] ENDOSCOPY;  Service: Cardiovascular;  Laterality: N/A;  .  TONSILLECTOMY AND ADENOIDECTOMY  1950   at 86 years of age    reports that she has never smoked. She has never used smokeless tobacco. She reports that she does not drink alcohol or use drugs. family history includes Aortic aneurysm in her mother and sister; Cancer in her mother; Diabetes in her mother; Heart attack in her mother; Hypertension in her mother; Pneumonia in her father; Stroke in her mother. Allergies  Allergen Reactions  . Bactrim Nausea And Vomiting   . Dilaudid [Hydromorphone Hcl] Other (See Comments)    Unknown  . Hydrocodone Nausea And Vomiting  . Invokana [Canagliflozin] Itching and Other (See Comments)    Yeast Infections   . Macrodantin Nausea And Vomiting     Review of Systems  Constitutional: Positive for fatigue. Negative for chills and fever.  Eyes: Negative for visual disturbance.  Respiratory: Negative for cough, chest tightness, shortness of breath and wheezing.   Cardiovascular: Negative for chest pain, palpitations and leg swelling.  Genitourinary: Negative for dysuria.  Musculoskeletal: Positive for back pain.  Neurological: Negative for dizziness, seizures, syncope, light-headedness and headaches.       Objective:   Physical Exam Vitals reviewed.  Constitutional:      Appearance: Normal appearance.  Cardiovascular:     Rate and Rhythm: Normal rate.  Pulmonary:     Effort: Pulmonary effort is normal.     Breath sounds: Normal breath sounds.  Musculoskeletal:     Comments: She has prominent varicose veins in both legs but no pitting edema  Neurological:     General: No focal deficit present.     Mental Status: She is alert and oriented to person, place, and time.     Cranial Nerves: No cranial nerve deficit.     Motor: No weakness.     Coordination: Coordination normal.     Comments: Able to transfer from chair to table with minimal assistance Normal sensory function with monofilament testing of both feet        Assessment:     #1 increased fall risk.  Patient is having some increased balance issues.  Not clear whether this worsened after her fall in February.  She does have history of reported neuropathy but has fairly well-maintained sensation with monofilament both feet  #2 type 2 diabetes with history of suboptimal control  #3 bilateral blepharoptosis    Plan:     -Discussed fall prevention with handout given -Set up home physical therapy for balance improvement in fall risk  reduction -We gave her name of local ophthalmology group that does blepharoplasty -66-month follow-up and recheck A1c then.  Bring home blood sugars at that time for review  Eulas Post MD [facility] at [facility]

## 2019-11-22 DIAGNOSIS — G8929 Other chronic pain: Secondary | ICD-10-CM | POA: Diagnosis not present

## 2019-11-22 DIAGNOSIS — Z9181 History of falling: Secondary | ICD-10-CM | POA: Diagnosis not present

## 2019-11-22 DIAGNOSIS — H02403 Unspecified ptosis of bilateral eyelids: Secondary | ICD-10-CM | POA: Diagnosis not present

## 2019-11-22 DIAGNOSIS — E1142 Type 2 diabetes mellitus with diabetic polyneuropathy: Secondary | ICD-10-CM | POA: Diagnosis not present

## 2019-11-22 DIAGNOSIS — I4891 Unspecified atrial fibrillation: Secondary | ICD-10-CM | POA: Diagnosis not present

## 2019-11-22 DIAGNOSIS — Z7901 Long term (current) use of anticoagulants: Secondary | ICD-10-CM | POA: Diagnosis not present

## 2019-11-22 DIAGNOSIS — M7521 Bicipital tendinitis, right shoulder: Secondary | ICD-10-CM | POA: Diagnosis not present

## 2019-11-22 DIAGNOSIS — E1165 Type 2 diabetes mellitus with hyperglycemia: Secondary | ICD-10-CM | POA: Diagnosis not present

## 2019-11-22 DIAGNOSIS — Z794 Long term (current) use of insulin: Secondary | ICD-10-CM | POA: Diagnosis not present

## 2019-11-22 DIAGNOSIS — E785 Hyperlipidemia, unspecified: Secondary | ICD-10-CM | POA: Diagnosis not present

## 2019-11-22 DIAGNOSIS — M549 Dorsalgia, unspecified: Secondary | ICD-10-CM | POA: Diagnosis not present

## 2019-11-22 DIAGNOSIS — Z853 Personal history of malignant neoplasm of breast: Secondary | ICD-10-CM | POA: Diagnosis not present

## 2019-11-22 DIAGNOSIS — E039 Hypothyroidism, unspecified: Secondary | ICD-10-CM | POA: Diagnosis not present

## 2019-11-27 ENCOUNTER — Telehealth: Payer: Self-pay | Admitting: Family Medicine

## 2019-11-27 DIAGNOSIS — E039 Hypothyroidism, unspecified: Secondary | ICD-10-CM | POA: Diagnosis not present

## 2019-11-27 DIAGNOSIS — M7521 Bicipital tendinitis, right shoulder: Secondary | ICD-10-CM | POA: Diagnosis not present

## 2019-11-27 DIAGNOSIS — I4891 Unspecified atrial fibrillation: Secondary | ICD-10-CM | POA: Diagnosis not present

## 2019-11-27 DIAGNOSIS — G8929 Other chronic pain: Secondary | ICD-10-CM | POA: Diagnosis not present

## 2019-11-27 DIAGNOSIS — Z794 Long term (current) use of insulin: Secondary | ICD-10-CM | POA: Diagnosis not present

## 2019-11-27 DIAGNOSIS — Z7901 Long term (current) use of anticoagulants: Secondary | ICD-10-CM | POA: Diagnosis not present

## 2019-11-27 DIAGNOSIS — E785 Hyperlipidemia, unspecified: Secondary | ICD-10-CM | POA: Diagnosis not present

## 2019-11-27 DIAGNOSIS — Z9181 History of falling: Secondary | ICD-10-CM | POA: Diagnosis not present

## 2019-11-27 DIAGNOSIS — M549 Dorsalgia, unspecified: Secondary | ICD-10-CM | POA: Diagnosis not present

## 2019-11-27 DIAGNOSIS — Z853 Personal history of malignant neoplasm of breast: Secondary | ICD-10-CM | POA: Diagnosis not present

## 2019-11-27 DIAGNOSIS — H02403 Unspecified ptosis of bilateral eyelids: Secondary | ICD-10-CM | POA: Diagnosis not present

## 2019-11-27 DIAGNOSIS — E1165 Type 2 diabetes mellitus with hyperglycemia: Secondary | ICD-10-CM | POA: Diagnosis not present

## 2019-11-27 DIAGNOSIS — E1142 Type 2 diabetes mellitus with diabetic polyneuropathy: Secondary | ICD-10-CM | POA: Diagnosis not present

## 2019-11-27 NOTE — Telephone Encounter (Signed)
Ok to proceed with orders as requested.   If her side pain continues or progresses get in for office follow up and we can decide if any x-rays needed.

## 2019-11-27 NOTE — Telephone Encounter (Signed)
Please advise. Okay to give orders ?

## 2019-11-27 NOTE — Telephone Encounter (Signed)
Left detailed voicemail with verbal orders and stated that if side pain continues to get a office visit with [place]

## 2019-11-27 NOTE — Telephone Encounter (Signed)
Liji is calling stating that the pt is complaining about pain on her R side and back not sure is this is a great concern since it is interment.  All vitals are normal and pt has had some trouble sleeping last night.   Liji is needing verbal orders for Home Health PT with 1 week 1 2 week 4 1 week 3 effective 11/22/2019 to work on gait and balance and strength.  A detail message can be left on the PT's number  [phone].

## 2019-11-28 ENCOUNTER — Other Ambulatory Visit: Payer: Self-pay | Admitting: Family Medicine

## 2019-11-30 ENCOUNTER — Telehealth: Payer: Self-pay | Admitting: Family Medicine

## 2019-11-30 DIAGNOSIS — E785 Hyperlipidemia, unspecified: Secondary | ICD-10-CM | POA: Diagnosis not present

## 2019-11-30 DIAGNOSIS — M7521 Bicipital tendinitis, right shoulder: Secondary | ICD-10-CM | POA: Diagnosis not present

## 2019-11-30 DIAGNOSIS — G8929 Other chronic pain: Secondary | ICD-10-CM | POA: Diagnosis not present

## 2019-11-30 DIAGNOSIS — M549 Dorsalgia, unspecified: Secondary | ICD-10-CM | POA: Diagnosis not present

## 2019-11-30 DIAGNOSIS — I4891 Unspecified atrial fibrillation: Secondary | ICD-10-CM | POA: Diagnosis not present

## 2019-11-30 DIAGNOSIS — Z9181 History of falling: Secondary | ICD-10-CM | POA: Diagnosis not present

## 2019-11-30 DIAGNOSIS — H02403 Unspecified ptosis of bilateral eyelids: Secondary | ICD-10-CM | POA: Diagnosis not present

## 2019-11-30 DIAGNOSIS — Z794 Long term (current) use of insulin: Secondary | ICD-10-CM | POA: Diagnosis not present

## 2019-11-30 DIAGNOSIS — E039 Hypothyroidism, unspecified: Secondary | ICD-10-CM | POA: Diagnosis not present

## 2019-11-30 DIAGNOSIS — E1142 Type 2 diabetes mellitus with diabetic polyneuropathy: Secondary | ICD-10-CM | POA: Diagnosis not present

## 2019-11-30 DIAGNOSIS — E1165 Type 2 diabetes mellitus with hyperglycemia: Secondary | ICD-10-CM | POA: Diagnosis not present

## 2019-11-30 DIAGNOSIS — Z7901 Long term (current) use of anticoagulants: Secondary | ICD-10-CM | POA: Diagnosis not present

## 2019-11-30 DIAGNOSIS — Z853 Personal history of malignant neoplasm of breast: Secondary | ICD-10-CM | POA: Diagnosis not present

## 2019-11-30 NOTE — Telephone Encounter (Signed)
Pt stated that she needs her PCP to call in some medication for yeast infection. She stated that she spoke to PCP about it and was told to wait and if it doesn't get better to call back and he will call in medication.   Pharmacy: [facility]: [phone]

## 2019-12-01 NOTE — Telephone Encounter (Signed)
Fluconazole 150 mg x 1 dose

## 2019-12-05 ENCOUNTER — Other Ambulatory Visit: Payer: Self-pay

## 2019-12-05 MED ORDER — FLUCONAZOLE 150 MG PO TABS
150.0000 mg | ORAL_TABLET | Freq: Once | ORAL | 0 refills | Status: AC
Start: 2019-12-05 — End: 2019-12-05

## 2019-12-05 NOTE — Addendum Note (Signed)
Addended by: Agnes Lawrence on: 12/05/2019 09:37 AM   Modules accepted: Orders

## 2019-12-05 NOTE — Telephone Encounter (Signed)
Rx done and I left a detailed message with this information at the pts cell number.

## 2019-12-06 ENCOUNTER — Ambulatory Visit (INDEPENDENT_AMBULATORY_CARE_PROVIDER_SITE_OTHER): Payer: Medicare Other | Admitting: Family Medicine

## 2019-12-06 ENCOUNTER — Encounter: Payer: Self-pay | Admitting: Family Medicine

## 2019-12-06 VITALS — BP 116/62 | HR 64 | Temp 97.9°F | Wt 141.9 lb

## 2019-12-06 DIAGNOSIS — K5900 Constipation, unspecified: Secondary | ICD-10-CM

## 2019-12-06 DIAGNOSIS — R1031 Right lower quadrant pain: Secondary | ICD-10-CM

## 2019-12-06 LAB — POCT URINALYSIS DIPSTICK
Bilirubin, UA: 1
Blood, UA: NEGATIVE
Glucose, UA: NEGATIVE
Leukocytes, UA: NEGATIVE
Nitrite, UA: NEGATIVE
Protein, UA: POSITIVE — AB
Spec Grav, UA: >=1.03 — AB (ref 1.010–1.025)
Urobilinogen, UA: 1 E.U./dL
pH, UA: 5 (ref 5.0–8.0)

## 2019-12-06 NOTE — Patient Instructions (Signed)
Urine looks clear  We will call with CBC results  Watch for any fever, vomiting, rash, stool changes, or worsening pain.   Consider Miralax for the constipation symptoms.

## 2019-12-06 NOTE — Progress Notes (Signed)
Subjective:     Patient ID: Julie Norton, female   DOB: 08-20-1929, 86 y.o.   MRN: [id number]  HPI   Julie Norton has history of atrial fibrillation, hypothyroidism, type 2 diabetes, peripheral neuropathy, remote history of breast cancer.  She is seen today with 2-week history of reported pain involving her right side mostly right lower quadrant abdomen but with some radiation toward the back.  Pain seems to be worse with movement.  She has had some chronic arthritic pains in her back and has diffuse osteoarthritis involving multiple joints.  She has had some recent occasional constipation.  No fever.  No chills.  She states she has had previous appendectomy years ago.  No skin rash.  No history of kidney stones.  No dysuria.  Denies any nausea or vomiting.  She had colonoscopy 2018.  She has diffuse diverticulosis throughout.  Past Medical History:  Diagnosis Date  . Abdominal adhesions   . Abdominal gas pain    suspected -- may be related to intermittent right upper quadrant discomfort radiating around her back      . Breast cancer ([place])   . Breast mass    left breast  . Cancer ([place])    left breast  . Chest pain    intermittent right upper quadrant discomfort radiating around her back      . Complication of anesthesia   . Diabetes mellitus without complication ([place])    Type II  . Dyspepsia   . GERD (gastroesophageal reflux disease)   . H/O: hysterectomy 1978  . Hypercholesteremia   . Hypothyroidism   . Personal history of radiation therapy   . Pneumonia   . PONV (postoperative nausea and vomiting)   . Tendinitis    of the right arm and shoulder  . Torn tendon    right shoulder   Past Surgical History:  Procedure Laterality Date  . ABDOMINAL HYSTERECTOMY    . APPENDECTOMY  1978  . BLADDER REPAIR    . BREAST LUMPECTOMY Left 02/24/2011   central partial mastectomy - dr Julie Norton  . BREAST REDUCTION SURGERY    . CARDIOVERSION N/A 10/28/2017   Procedure: CARDIOVERSION;   Surgeon: Julie Perla, MD;  Location: [facility] ENDOSCOPY;  Service: Cardiovascular;  Laterality: N/A;  . CARDIOVERSION N/A 04/06/2019   Procedure: CARDIOVERSION;  Surgeon: Julie Dresser, MD;  Location: [facility] ENDOSCOPY;  Service: Cardiovascular;  Laterality: N/A;  . CARDIOVERSION N/A 09/08/2019   Procedure: CARDIOVERSION;  Surgeon: Julie Spark, MD;  Location: [place];  Service: Cardiovascular;  Laterality: N/A;  . COLONOSCOPY WITH PROPOFOL N/A 03/24/2017   Procedure: COLONOSCOPY WITH PROPOFOL;  Surgeon: Julie Corner, MD;  Location: [place];  Service: Endoscopy;  Laterality: N/A;  . ESOPHAGOGASTRODUODENOSCOPY (EGD) WITH PROPOFOL N/A 03/24/2017   Procedure: ESOPHAGOGASTRODUODENOSCOPY (EGD) WITH PROPOFOL;  Surgeon: Julie Corner, MD;  Location: [place];  Service: Endoscopy;  Laterality: N/A;  . LIPOMA EXCISION Right   . REDUCTION MAMMAPLASTY Bilateral 1998  . RETINAL DETACHMENT SURGERY Left   . SALPINGOOPHORECTOMY    . TEE WITHOUT CARDIOVERSION N/A 10/28/2017   Procedure: TRANSESOPHAGEAL ECHOCARDIOGRAM (TEE);  Surgeon: Julie Perla, MD;  Location: [facility] ENDOSCOPY;  Service: Cardiovascular;  Laterality: N/A;  . [place]   at 86 years of age    reports that she has never smoked. She has never used smokeless tobacco. She reports that she does not drink alcohol or use drugs. family history includes Aortic aneurysm in her mother and sister; Cancer  in her mother; Diabetes in her mother; Heart attack in her mother; Hypertension in her mother; Pneumonia in her father; Stroke in her mother. Allergies  Allergen Reactions  . Bactrim Nausea And Vomiting  . Dilaudid [Hydromorphone Hcl] Other (See Comments)    Unknown  . Hydrocodone Nausea And Vomiting  . Invokana [Canagliflozin] Itching and Other (See Comments)    Yeast Infections   . Macrodantin Nausea And Vomiting     Review of Systems  Constitutional: Negative for chills and fever.   Gastrointestinal: Positive for abdominal pain and constipation. Negative for abdominal distention, blood in stool, nausea and vomiting.  Genitourinary: Negative for dysuria and hematuria.  Skin: Negative for rash.       Objective:   Physical Exam Vitals reviewed.  Constitutional:      Appearance: Normal appearance.  Cardiovascular:     Rate and Rhythm: Normal rate.  Pulmonary:     Effort: Pulmonary effort is normal.     Breath sounds: Normal breath sounds.  Abdominal:     General: Bowel sounds are normal.     Palpations: Abdomen is soft.     Comments: Minimal tenderness right lower quadrant to deep palpation.  No masses palpated.  No guarding or rebound.  Skin:    Findings: No rash.  Neurological:     Mental Status: She is alert.        Assessment:     Patient presents with 2-week history of right lower quadrant pain.  She has no red flags such as fever, hematuria, etc.  Differential is broad at this time.  This is worse with movement which suggest possible musculoskeletal origin.  No rash to suggest shingles.  She does have diffuse diverticulosis throughout and cannot rule out possible diverticulitis.  She also has some constipation which may be playing some role.  No history of kidney stones    Plan:     -Check urine dipstick and CBC with differential -consider OTC Miralax for constipation -follow up immediately for any fever or worsening pain  Julie Post MD [facility] at [facility]

## 2019-12-07 ENCOUNTER — Telehealth: Payer: Self-pay | Admitting: Family Medicine

## 2019-12-07 LAB — CBC WITH DIFFERENTIAL/PLATELET
Basophils Absolute: 0 10*3/uL (ref 0.0–0.1)
Basophils Relative: 0.7 % (ref 0.0–3.0)
Eosinophils Absolute: 0.2 10*3/uL (ref 0.0–0.7)
Eosinophils Relative: 2.9 % (ref 0.0–5.0)
HCT: 31.9 % — ABNORMAL LOW (ref 36.0–46.0)
Hemoglobin: 10.6 g/dL — ABNORMAL LOW (ref 12.0–15.0)
Lymphocytes Relative: 23.4 % (ref 12.0–46.0)
Lymphs Abs: 1.3 10*3/uL (ref 0.7–4.0)
MCHC: 33.1 g/dL (ref 30.0–36.0)
MCV: 78.3 fl (ref 78.0–100.0)
Monocytes Absolute: 0.8 10*3/uL (ref 0.1–1.0)
Monocytes Relative: 14.2 % — ABNORMAL HIGH (ref 3.0–12.0)
Neutro Abs: 3.2 10*3/uL (ref 1.4–7.7)
Neutrophils Relative %: 58.8 % (ref 43.0–77.0)
Platelets: 185 10*3/uL (ref 150.0–400.0)
RBC: 4.08 Mil/uL (ref 3.87–5.11)
RDW: 18.7 % — ABNORMAL HIGH (ref 11.5–15.5)
WBC: 5.4 10*3/uL (ref 4.0–10.5)

## 2019-12-07 NOTE — Telephone Encounter (Signed)
Liji call and stated pt canceled Pt visit today and will add on later.

## 2019-12-08 DIAGNOSIS — E1165 Type 2 diabetes mellitus with hyperglycemia: Secondary | ICD-10-CM | POA: Diagnosis not present

## 2019-12-08 DIAGNOSIS — Z9181 History of falling: Secondary | ICD-10-CM | POA: Diagnosis not present

## 2019-12-08 DIAGNOSIS — E1142 Type 2 diabetes mellitus with diabetic polyneuropathy: Secondary | ICD-10-CM | POA: Diagnosis not present

## 2019-12-08 DIAGNOSIS — H02403 Unspecified ptosis of bilateral eyelids: Secondary | ICD-10-CM | POA: Diagnosis not present

## 2019-12-08 DIAGNOSIS — I4891 Unspecified atrial fibrillation: Secondary | ICD-10-CM | POA: Diagnosis not present

## 2019-12-08 DIAGNOSIS — Z853 Personal history of malignant neoplasm of breast: Secondary | ICD-10-CM | POA: Diagnosis not present

## 2019-12-08 DIAGNOSIS — M549 Dorsalgia, unspecified: Secondary | ICD-10-CM | POA: Diagnosis not present

## 2019-12-08 DIAGNOSIS — Z7901 Long term (current) use of anticoagulants: Secondary | ICD-10-CM | POA: Diagnosis not present

## 2019-12-08 DIAGNOSIS — G8929 Other chronic pain: Secondary | ICD-10-CM | POA: Diagnosis not present

## 2019-12-08 DIAGNOSIS — E785 Hyperlipidemia, unspecified: Secondary | ICD-10-CM | POA: Diagnosis not present

## 2019-12-08 DIAGNOSIS — M7521 Bicipital tendinitis, right shoulder: Secondary | ICD-10-CM | POA: Diagnosis not present

## 2019-12-08 DIAGNOSIS — E039 Hypothyroidism, unspecified: Secondary | ICD-10-CM | POA: Diagnosis not present

## 2019-12-08 DIAGNOSIS — Z794 Long term (current) use of insulin: Secondary | ICD-10-CM | POA: Diagnosis not present

## 2019-12-08 NOTE — Telephone Encounter (Signed)
Noted  

## 2019-12-11 DIAGNOSIS — I5033 Acute on chronic diastolic (congestive) heart failure: Secondary | ICD-10-CM | POA: Diagnosis not present

## 2019-12-11 DIAGNOSIS — I4819 Other persistent atrial fibrillation: Secondary | ICD-10-CM | POA: Diagnosis not present

## 2019-12-11 DIAGNOSIS — I4510 Unspecified right bundle-branch block: Secondary | ICD-10-CM | POA: Diagnosis not present

## 2019-12-12 DIAGNOSIS — E039 Hypothyroidism, unspecified: Secondary | ICD-10-CM | POA: Diagnosis not present

## 2019-12-12 DIAGNOSIS — Z7901 Long term (current) use of anticoagulants: Secondary | ICD-10-CM | POA: Diagnosis not present

## 2019-12-12 DIAGNOSIS — M7521 Bicipital tendinitis, right shoulder: Secondary | ICD-10-CM | POA: Diagnosis not present

## 2019-12-12 DIAGNOSIS — E1142 Type 2 diabetes mellitus with diabetic polyneuropathy: Secondary | ICD-10-CM | POA: Diagnosis not present

## 2019-12-12 DIAGNOSIS — M549 Dorsalgia, unspecified: Secondary | ICD-10-CM | POA: Diagnosis not present

## 2019-12-12 DIAGNOSIS — G8929 Other chronic pain: Secondary | ICD-10-CM | POA: Diagnosis not present

## 2019-12-12 DIAGNOSIS — E1165 Type 2 diabetes mellitus with hyperglycemia: Secondary | ICD-10-CM | POA: Diagnosis not present

## 2019-12-12 DIAGNOSIS — Z9181 History of falling: Secondary | ICD-10-CM | POA: Diagnosis not present

## 2019-12-12 DIAGNOSIS — Z794 Long term (current) use of insulin: Secondary | ICD-10-CM | POA: Diagnosis not present

## 2019-12-12 DIAGNOSIS — Z853 Personal history of malignant neoplasm of breast: Secondary | ICD-10-CM | POA: Diagnosis not present

## 2019-12-12 DIAGNOSIS — I4891 Unspecified atrial fibrillation: Secondary | ICD-10-CM | POA: Diagnosis not present

## 2019-12-12 DIAGNOSIS — H02403 Unspecified ptosis of bilateral eyelids: Secondary | ICD-10-CM | POA: Diagnosis not present

## 2019-12-12 DIAGNOSIS — E785 Hyperlipidemia, unspecified: Secondary | ICD-10-CM | POA: Diagnosis not present

## 2019-12-12 LAB — COMPREHENSIVE METABOLIC PANEL
ALT: 32 IU/L (ref 0–32)
AST: 39 IU/L (ref 0–40)
Albumin/Globulin Ratio: 1.8 (ref 1.2–2.2)
Albumin: 4.1 g/dL (ref 3.6–4.6)
Alkaline Phosphatase: 87 IU/L (ref 48–121)
BUN/Creatinine Ratio: 17 (ref 12–28)
BUN: 12 mg/dL (ref 8–27)
Bilirubin Total: 0.4 mg/dL (ref 0.0–1.2)
CO2: 20 mmol/L (ref 20–29)
Calcium: 9.2 mg/dL (ref 8.7–10.3)
Chloride: 101 mmol/L (ref 96–106)
Creatinine, Ser: 0.72 mg/dL (ref 0.57–1.00)
GFR calc Af Amer: 87 mL/min/{1.73_m2} (ref >59)
GFR calc non Af Amer: 76 mL/min/{1.73_m2} (ref >59)
Globulin, Total: 2.3 g/dL (ref 1.5–4.5)
Glucose: 297 mg/dL — ABNORMAL HIGH (ref 65–99)
Potassium: 4.5 mmol/L (ref 3.5–5.2)
Sodium: 137 mmol/L (ref 134–144)
Total Protein: 6.4 g/dL (ref 6.0–8.5)

## 2019-12-12 LAB — TSH: TSH: 2.69 u[IU]/mL (ref 0.450–4.500)

## 2019-12-12 LAB — T4, FREE: Free T4: 1.77 ng/dL (ref 0.82–1.77)

## 2019-12-14 NOTE — Progress Notes (Signed)
Cardiology Office Note   Date:  12/15/2019   ID:  Julie Norton, DOB [birth year], MRN [id number]  PCP:  Eulas Post, MD  Cardiologist: Pranathi Winfree Martinique MD  Chief Complaint  Patient presents with  . Atrial Fibrillation      History of Present Illness: Julie Norton is a 86 y.o. female who presents for follow up of Atrial fibrillation.   She has a past history of hypercholesterolemia. She also has a history of DM on  insulin. Echo in 2015 showed mild MR with normal LV function. She had a normal Myoview study in November 2017.   She was seen by Dr. Elease Hashimoto on 09/20/17. Found to be in Afib with rate 95. Started on Eliquis. Follow up Echo showed mild biatrial enlargement and normal LV function. She  noted some fatigue, decreased energy and leg swelling. Some dyspnea and chest tightness. Was started on lasix with improvement of these symptoms. She subsequently underwent TEE guided DCCV on 10/28/17 with return to NSR.   Was seen in early September 2020 with  increased leg swelling for 2 months. Noted SOB going up stairs. Was found to be in Afib with rate 105. Toprol XL was added for rate control. Lasix was increased. Since she had missed a couple of Eliquis doses she DCCV was deferred. We did try her on Flecainide but this resulted in severe nausea and diarrhea and was discontinued.   She was seen in the ED on 03/27/19 with complaints of epigastric pain/abdominal pain. Mildly elevated lipase. Other labs OK. Ecg in Afib with rate 80. CXR and CT abdomen were normal.  She subsequently underwent successful DCCV on 04/06/19. Seen back on October 27 and was in NSR. Dr Burchette's note of Dec 18 thought she was back in Afib but no Ecg done. Rate controlled. When seen last month she  noted more leg edema and fatigue. Mild SOB.  She was started on amiodarone  at 400 mg daily. She underwent repeat DCCV on March 5 successfully. Due to some nausea amiodarone was reduced to 200 mg daily.   On  follow up today she is maintaining NSR. She has rare skipped beats. Nausea has improved with reduction of amiodarone to 200 mg daily.  She notes she is fatigued. Is getting home PT for back pain.    Past Medical History:  Diagnosis Date  . Abdominal adhesions   . Abdominal gas pain    suspected -- may be related to intermittent right upper quadrant discomfort radiating around her back      . Breast cancer ([place])   . Breast mass    left breast  . Cancer ([place])    left breast  . Chest pain    intermittent right upper quadrant discomfort radiating around her back      . Complication of anesthesia   . Diabetes mellitus without complication ([place])    Type II  . Dyspepsia   . GERD (gastroesophageal reflux disease)   . H/O: hysterectomy 1978  . Hypercholesteremia   . Hypothyroidism   . Personal history of radiation therapy   . Pneumonia   . PONV (postoperative nausea and vomiting)   . Tendinitis    of the right arm and shoulder  . Torn tendon    right shoulder    Past Surgical History:  Procedure Laterality Date  . ABDOMINAL HYSTERECTOMY    . APPENDECTOMY  1978  . BLADDER REPAIR    . BREAST LUMPECTOMY Left 02/24/2011  central partial mastectomy - dr Margot Chimes  . BREAST REDUCTION SURGERY    . CARDIOVERSION N/A 10/28/2017   Procedure: CARDIOVERSION;  Surgeon: Lelon Perla, MD;  Location: [facility] ENDOSCOPY;  Service: Cardiovascular;  Laterality: N/A;  . CARDIOVERSION N/A 04/06/2019   Procedure: CARDIOVERSION;  Surgeon: [name], MD;  Location: [facility] ENDOSCOPY;  Service: Cardiovascular;  Laterality: N/A;  . CARDIOVERSION N/A 09/08/2019   Procedure: CARDIOVERSION;  Surgeon: Dorothy Spark, MD;  Location: [place];  Service: Cardiovascular;  Laterality: N/A;  . COLONOSCOPY WITH PROPOFOL N/A 03/24/2017   Procedure: COLONOSCOPY WITH PROPOFOL;  Surgeon: Wilford Corner, MD;  Location: [place];  Service: Endoscopy;  Laterality: N/A;  . ESOPHAGOGASTRODUODENOSCOPY (EGD)  WITH PROPOFOL N/A 03/24/2017   Procedure: ESOPHAGOGASTRODUODENOSCOPY (EGD) WITH PROPOFOL;  Surgeon: Wilford Corner, MD;  Location: [place];  Service: Endoscopy;  Laterality: N/A;  . LIPOMA EXCISION Right   . REDUCTION MAMMAPLASTY Bilateral 1998  . RETINAL DETACHMENT SURGERY Left   . SALPINGOOPHORECTOMY    . TEE WITHOUT CARDIOVERSION N/A 10/28/2017   Procedure: TRANSESOPHAGEAL ECHOCARDIOGRAM (TEE);  Surgeon: Lelon Perla, MD;  Location[facility] ENDOSCOPY;  Service: Cardiovascular;  Laterality: N/A;  . TONSILLECTOMY AND ADENOIDECTOMY  1950   at 86 years of age     Current Outpatient Medications  Medication Sig Dispense Refill  . acetaminophen (TYLENOL) 500 MG tablet Take 1,500 mg by mouth every 6 (six) hours as needed for mild pain or moderate pain.    Marland Kitchen amiodarone (PACERONE) 200 MG tablet Take 1 tablet (200 mg total) by mouth daily. 90 tablet 3  . apixaban (ELIQUIS) 5 MG TABS tablet Take 1 tablet (5 mg total) by mouth 2 (two) times daily. 180 tablet 3  . atorvastatin (LIPITOR) 10 MG tablet TAKE ONE-HALF TABLET BY  MOUTH EVERY OTHER DAY OR AS DIRECTED 21 tablet 3  . cephALEXin (KEFLEX) 500 MG capsule Take 1 capsule (500 mg total) by mouth 3 (three) times daily. 15 capsule 0  . ciprofloxacin (CIPRO) 500 MG tablet Take 1 tablet (500 mg total) by mouth 2 (two) times daily. 14 tablet 0  . [employer] D3 125 MCG (5000 UT) capsule TAKE 1 CAPSULE BY MOUTH EVERY DAY (Patient taking differently: Take 5,000 Units by mouth daily. ) 30 capsule 3  . furosemide (LASIX) 40 MG tablet Take 1 tablet (40 mg total) by mouth daily. 90 tablet 3  . glucose blood (ONE TOUCH ULTRA TEST) test strip Check once daily. E11.40 100 each 11  . Insulin Pen Needle 29G X 5MM MISC Use once daily 100 each 3  . levothyroxine (SYNTHROID) 50 MCG tablet TAKE 1 TABLET BY MOUTH  DAILY 90 tablet 2  . loratadine (CLARITIN) 10 MG tablet Take 10 mg by mouth daily as needed for allergies.    . metFORMIN (GLUCOPHAGE-XR) 500 MG 24 hr tablet  TAKE 1 TABLET BY MOUTH  TWICE DAILY 180 tablet 1  . metoprolol succinate (TOPROL XL) 25 MG 24 hr tablet Take 1 tablet (25 mg total) by mouth daily. 90 tablet 3  . omeprazole (PRILOSEC) 20 MG capsule TAKE 1 CAPSULE BY MOUTH  TWICE DAILY 180 capsule 2  . ONE TOUCH ULTRA TEST test strip CHECK ONCE A DAY 50 each 3  . traZODone (DESYREL) 50 MG tablet TAKE 1/2 TO 1 TABLET BY MOUTH AT BEDTIME AS NEEDED FOR SLEEP 90 tablet 1  . TRESIBA FLEXTOUCH 100 UNIT/ML SOPN FlexTouch Pen INJECT 24 UNITS TOTAL INTO THE SKIN DAILY AT 10PM (Patient taking differently: Inject 17 Units  into the skin at bedtime. ) 15 pen 1  . vitamin B-12 (CYANOCOBALAMIN) 1000 MCG tablet Take 1,000 mcg by mouth daily.     No current facility-administered medications for this visit.    Allergies:   Bactrim, Dilaudid [hydromorphone hcl], Hydrocodone, Invokana [canagliflozin], and Macrodantin    Social History:  The patient  reports that she has never smoked. She has never used smokeless tobacco. She reports that she does not drink alcohol and does not use drugs.   Family History:  The patient's family history includes Aortic aneurysm in her mother and sister; Cancer in her mother; Diabetes in her mother; Heart attack in her mother; Hypertension in her mother; Pneumonia in her father; Stroke in her mother.    ROS:  Please see the history of present illness.   Otherwise, review of systems are positive for none.   All other systems are reviewed and negative.    PHYSICAL EXAM: VS:  BP 128/60   Pulse 63   Ht 5' 1.5" (1.562 m)   Wt 141 lb 3.2 oz (64 kg)   SpO2 95%   BMI 26.25 kg/m  , BMI Body mass index is 26.25 kg/m. GENERAL:  Well appearing WF  In NAD HEENT:  PERRL, EOMI, sclera are clear. Oropharynx is clear. NECK:  No jugular venous distention, carotid upstroke brisk and symmetric, no bruits, no thyromegaly or adenopathy LUNGS:  Clear to auscultation bilaterally CHEST:  Unremarkable HEART:  RRR,  PMI not displaced or  sustained,S1 and S2 within normal limits, no S3, no S4: no clicks, no rubs, no murmurs ABD:  Soft, nontender. BS +, no masses or bruits. No hepatomegaly, no splenomegaly EXT:  2 + pulses throughout, 2+ edema, no cyanosis no clubbing SKIN:  Warm and dry.  No rashes NEURO:  Alert and oriented x 3. Cranial nerves II through XII intact. PSYCH:  Cognitively intact  EKG:  EKG is  Not ordered today.   Recent Labs: 12/06/2019: Hemoglobin 10.6; Platelets 185.0 12/11/2019: ALT 32; BUN 12; Creatinine, Ser 0.72; Potassium 4.5; Sodium 137; TSH 2.690    Lipid Panel    Component Value Date/Time   CHOL 94 06/23/2019 0825   CHOL 135 04/26/2017 0840   TRIG 70.0 06/23/2019 0825   HDL 35.20 (L) 06/23/2019 0825   HDL 45 04/26/2017 0840   CHOLHDL 3 06/23/2019 0825   VLDL 14.0 06/23/2019 0825   LDLCALC 45 06/23/2019 0825   LDLCALC 70 04/26/2017 0840      Wt Readings from Last 3 Encounters:  12/15/19 141 lb 3.2 oz (64 kg)  12/06/19 141 lb 14.4 oz (64.4 kg)  11/13/19 139 lb (63 kg)      Lexiscan myoview 05/26/16:Study Highlights    Nuclear stress EF: 72%.  There was no ST segment deviation noted during stress.  This is a low risk study.  The left ventricular ejection fraction is hyperdynamic (>65%).   1. Small, mild mid-anteroseptal perfusion defect.  This defect was actually more intense at rest than with stress.  No ischemia.  Suspect attenuation.  2. EF 72% with normal wall motion.  3. Overall low risk study.     Echo 09/24/17: Study Conclusions  - Left ventricle: The cavity size was normal. Systolic function was   normal. The estimated ejection fraction was in the range of 55%   to 60%. Wall motion was normal; there were no regional wall   motion abnormalities. - Mitral valve: There was mild regurgitation. - Left atrium: The atrium was mildly  dilated. - Right atrium: The atrium was mildly dilated. - Atrial septum: No defect or patent foramen ovale was identified. - Pulmonary  arteries: Systolic pressure was mildly increased. PA   peak pressure: 37 mm Hg (S).    ASSESSMENT AND PLAN:  1. Atrial fibrillation. Recurrent. [place] vasc score of 4. On Eliquis. Rate controlled on Toprol XL.  S/p TEE guided DCCV in April 2019. Repeat DCCV on April 06, 2019 with ERAD. She was intolerant of Flecainide. She was symptomatic with her Afib so was started on amiodarone. S/p  successful repeat DCCV on September 08, 2019. She is maintaining NSR so far. Now tolerating lower dose of amiodarone.  CMET and TFTs are OK.   2.  Hypercholesterolemia-  She is at goal. Continue low dose lipitor.   3. Acute on chronic diastolic CHF. Appears euvolemic on lasix 40 mg daily.   4. Diabetes mellitus- now on insulin.    5. History of Breast CA.     Signed, Anouk Critzer Martinique MD, [facility]   12/15/2019 2:42 PM     [facility]

## 2019-12-15 ENCOUNTER — Encounter: Payer: Self-pay | Admitting: Cardiology

## 2019-12-15 ENCOUNTER — Other Ambulatory Visit: Payer: Self-pay

## 2019-12-15 ENCOUNTER — Ambulatory Visit: Payer: Medicare Other | Admitting: Cardiology

## 2019-12-15 ENCOUNTER — Ambulatory Visit: Payer: Medicare Other

## 2019-12-15 VITALS — BP 128/60 | HR 63 | Ht 61.5 in | Wt 141.2 lb

## 2019-12-15 DIAGNOSIS — E7800 Pure hypercholesterolemia, unspecified: Secondary | ICD-10-CM | POA: Diagnosis not present

## 2019-12-15 DIAGNOSIS — M549 Dorsalgia, unspecified: Secondary | ICD-10-CM | POA: Diagnosis not present

## 2019-12-15 DIAGNOSIS — I4891 Unspecified atrial fibrillation: Secondary | ICD-10-CM | POA: Diagnosis not present

## 2019-12-15 DIAGNOSIS — G8929 Other chronic pain: Secondary | ICD-10-CM | POA: Diagnosis not present

## 2019-12-15 DIAGNOSIS — E1142 Type 2 diabetes mellitus with diabetic polyneuropathy: Secondary | ICD-10-CM | POA: Diagnosis not present

## 2019-12-15 DIAGNOSIS — E039 Hypothyroidism, unspecified: Secondary | ICD-10-CM | POA: Diagnosis not present

## 2019-12-15 DIAGNOSIS — H02403 Unspecified ptosis of bilateral eyelids: Secondary | ICD-10-CM | POA: Diagnosis not present

## 2019-12-15 DIAGNOSIS — I5032 Chronic diastolic (congestive) heart failure: Secondary | ICD-10-CM

## 2019-12-15 DIAGNOSIS — Z7901 Long term (current) use of anticoagulants: Secondary | ICD-10-CM | POA: Diagnosis not present

## 2019-12-15 DIAGNOSIS — Z794 Long term (current) use of insulin: Secondary | ICD-10-CM | POA: Diagnosis not present

## 2019-12-15 DIAGNOSIS — Z9181 History of falling: Secondary | ICD-10-CM | POA: Diagnosis not present

## 2019-12-15 DIAGNOSIS — Z853 Personal history of malignant neoplasm of breast: Secondary | ICD-10-CM | POA: Diagnosis not present

## 2019-12-15 DIAGNOSIS — I480 Paroxysmal atrial fibrillation: Secondary | ICD-10-CM

## 2019-12-15 DIAGNOSIS — E1165 Type 2 diabetes mellitus with hyperglycemia: Secondary | ICD-10-CM | POA: Diagnosis not present

## 2019-12-15 DIAGNOSIS — M7521 Bicipital tendinitis, right shoulder: Secondary | ICD-10-CM | POA: Diagnosis not present

## 2019-12-15 DIAGNOSIS — E785 Hyperlipidemia, unspecified: Secondary | ICD-10-CM | POA: Diagnosis not present

## 2019-12-15 NOTE — Patient Instructions (Signed)
Medication Instructions:  Your physician recommends that you continue on your current medications as directed. Please refer to the Current Medication list given to you today.  *If you need a refill on your cardiac medications before your next appointment, please call your pharmacy*  Lab Work: NONE If you have labs (blood work) drawn today and your tests are completely normal, you will receive your results only by: . MyChart Message (if you have MyChart) OR . A paper copy in the mail If you have any lab test that is abnormal or we need to change your treatment, we will call you to review the results.  Testing/Procedures: NONE  Follow-Up: At [facility], you and your health needs are our priority.  As part of our continuing mission to provide you with exceptional heart care, we have created designated Provider Care Teams.  These Care Teams include your primary Cardiologist (physician) and Advanced Practice Providers (APPs -  Physician Assistants and Nurse Practitioners) who all work together to provide you with the care you need, when you need it.  We recommend signing up for the patient portal called "MyChart".  Sign up information is provided on this After Visit Summary.  MyChart is used to connect with patients for Virtual Visits (Telemedicine).  Patients are able to view lab/test results, encounter notes, upcoming appointments, etc.  Non-urgent messages can be sent to your provider as well.   To learn more about what you can do with MyChart, go to [email or web address].    Your next appointment:   6 month(s)  The format for your next appointment:   In Person  Provider:   You may see Peter Jordan, MD or one of the following Advanced Practice Providers on your designated Care Team:    Hao Meng, PA-C  Angela Duke, PA-C or   Krista Kroeger, PA-C    

## 2019-12-18 ENCOUNTER — Ambulatory Visit: Payer: Medicare Other

## 2019-12-18 ENCOUNTER — Other Ambulatory Visit: Payer: Self-pay

## 2019-12-18 DIAGNOSIS — M7521 Bicipital tendinitis, right shoulder: Secondary | ICD-10-CM | POA: Diagnosis not present

## 2019-12-18 DIAGNOSIS — Z7901 Long term (current) use of anticoagulants: Secondary | ICD-10-CM | POA: Diagnosis not present

## 2019-12-18 DIAGNOSIS — K219 Gastro-esophageal reflux disease without esophagitis: Secondary | ICD-10-CM

## 2019-12-18 DIAGNOSIS — E039 Hypothyroidism, unspecified: Secondary | ICD-10-CM | POA: Diagnosis not present

## 2019-12-18 DIAGNOSIS — I5032 Chronic diastolic (congestive) heart failure: Secondary | ICD-10-CM

## 2019-12-18 DIAGNOSIS — H02403 Unspecified ptosis of bilateral eyelids: Secondary | ICD-10-CM | POA: Diagnosis not present

## 2019-12-18 DIAGNOSIS — Z794 Long term (current) use of insulin: Secondary | ICD-10-CM | POA: Diagnosis not present

## 2019-12-18 DIAGNOSIS — E1165 Type 2 diabetes mellitus with hyperglycemia: Secondary | ICD-10-CM

## 2019-12-18 DIAGNOSIS — F5104 Psychophysiologic insomnia: Secondary | ICD-10-CM

## 2019-12-18 DIAGNOSIS — E1142 Type 2 diabetes mellitus with diabetic polyneuropathy: Secondary | ICD-10-CM | POA: Diagnosis not present

## 2019-12-18 DIAGNOSIS — Z853 Personal history of malignant neoplasm of breast: Secondary | ICD-10-CM | POA: Diagnosis not present

## 2019-12-18 DIAGNOSIS — M549 Dorsalgia, unspecified: Secondary | ICD-10-CM | POA: Diagnosis not present

## 2019-12-18 DIAGNOSIS — I4819 Other persistent atrial fibrillation: Secondary | ICD-10-CM

## 2019-12-18 DIAGNOSIS — E7800 Pure hypercholesterolemia, unspecified: Secondary | ICD-10-CM

## 2019-12-18 DIAGNOSIS — G8929 Other chronic pain: Secondary | ICD-10-CM | POA: Diagnosis not present

## 2019-12-18 DIAGNOSIS — E538 Deficiency of other specified B group vitamins: Secondary | ICD-10-CM

## 2019-12-18 DIAGNOSIS — Z9181 History of falling: Secondary | ICD-10-CM | POA: Diagnosis not present

## 2019-12-18 DIAGNOSIS — I4891 Unspecified atrial fibrillation: Secondary | ICD-10-CM | POA: Diagnosis not present

## 2019-12-18 DIAGNOSIS — E785 Hyperlipidemia, unspecified: Secondary | ICD-10-CM | POA: Diagnosis not present

## 2019-12-18 DIAGNOSIS — R109 Unspecified abdominal pain: Secondary | ICD-10-CM

## 2019-12-18 NOTE — Patient Instructions (Addendum)
Visit Information  Goals Addressed            This Visit's Progress    Pharmacy Care Plan       CARE PLAN ENTRY  Current Barriers:   Chronic Disease Management support, education, and care coordination needs related to Hyperlipidemia, Diabetes, Atrial Fibrillation, Heart Failure, GERD, Hypothyroidism, and low vitamin B12 level   Hyperlipidemia  Pharmacist Clinical Goal(s): o Over the next 90 days, patient will work with PharmD and providers to maintain LDL goal < 70  Current regimen:  o Atorvastatin 10mg , 1 tablet once daily (0.5 tablet every other day)   Patient self care activities - Over the next 90 days, patient will: o Continue current medications   Diabetes  Pharmacist Clinical Goal(s): o Over the next 90 days, patient will work with PharmD and providers to achieve A1c goal <7%  Current regimen:   Metformin ER 500mg , 1 tablet twice daily  Tresiba Flextouch, inject 24 units once dily at 10 PM (right now using 16-17 units)   Interventions: o We discussed: how to recognize and treat signs of hypoglycemia and titration schedule per Dr. Elease Hashimoto   Patient self care activities - Over the next 90 days, patient will: o Check blood sugar once daily, document, and provide at future appointments o Contact provider with any episodes of hypoglycemia  Atrial fibrillation  Pharmacist Clinical Goal(s) o Over the next 90 days, patient will work with PharmD and providers to maintain in normal sinus rhythm.   Current regimen:   Amiodarone 200mg , 1 tablet once daily  metprolol succinate (Toprol XL) 25mg , 1 tablet once daily   apixaban (Eliquis) 5mg  ,1 tablet twice daily   Interventions: o We discussed: monitoring for signs and symptoms for bleeding (coughing up blood, prolonged nose bleeds, black, tarry stools).  Patient self care activities - Over the next 90 days, patient will: o Continue current medications as instructed.   Heart failure  Pharmacist Clinical  Goal(s) o Over the next 90 days, patient will work with PharmD and providers to maintain weight stable and minimize fluid retention.  Current regimen:   Metoprolol succinate (Toprol XL) 25mg , 1 tablet once daily   Furosemide 40mg , 1 tablet once daily   Interventions: o Recommended the latest to take furosemide (Lasix) is 6pm.   Patient self care activities o Patient will continue current medications as instructed.   Hypothyroidism  Pharmacist Clinical Goal(s) o Over the next 90 days, patient will work with PharmD and providers to maintain TSH: 0.450 - 4.500 uIU/mL  Current regimen:  o Levothyroxine 17mcg, 1 tablet once daily   Interventions: o We discussed:  administration of levothyroxine on an empty stomach, at least 30 minutes before first meal   Patient self care activities o Patient will continue current medications.   GERD   Pharmacist Clinical Goal(s) o Over the next 90 days, patient will work with PharmD and providers to minimize acid reflux symptoms.   Current regimen:  o Omeprazole 20mg , 1 capsule twice daily   Patient self care activities o Patient will continue current medication.   Low vitamin B12  Pharmacist Clinical Goal(s) o Over the next 90 days, patient will work with PharmD and providers to maintain levels: 211 - 911 pg/mL  Current regimen:  o Vitamin B12 (cyanocobalamin) 1000 units, 1 tablet once daily   Interventions: o We discussed: medications that can decrease vitamin B-12 absorption (metformin and long-term intake of omeprazole).   Patient self care activities o Patient will continue  current medication as instructed.    Medication management  Pharmacist Clinical Goal(s): o Over the next 90 days, patient will work with PharmD and providers to maintain optimal medication adherence  Current pharmacy[facility]   Interventions o Comprehensive medication review performed. o Continue current medication management  strategy  Patient self care activities - Over the next 90 days, patient will: o Take medications as prescribed o Report any questions or concerns to PharmD and/or provider(s)  Initial goal documentation        Ms. Apt was given information about Chronic Care Management services today including:  1. CCM service includes personalized support from designated clinical staff supervised by her physician, including individualized plan of care and coordination with other care providers 2. 24/7 contact phone numbers for assistance for urgent and routine care needs. 3. Standard insurance, coinsurance, copays and deductibles apply for chronic care management only during months in which we provide at least 20 minutes of these services. Most insurances cover these services at 100%, however patients may be responsible for any copay, coinsurance and/or deductible if applicable. This service may help you avoid the need for more expensive face-to-face services. 4. Only one practitioner may furnish and bill the service in a calendar month. 5. The patient may stop CCM services at any time (effective at the end of the month) by phone call to the office staff.  Patient agreed to services and verbal consent obtained.   The patient verbalized understanding of instructions provided today and agreed to receive a mailed copy of patient instruction and/or educational materials. The pharmacy team will reach out to the patient again over the next 90 days.   Anson Crofts, PharmD Clinical Pharmacist [facility] [place] [phone]   Insomnia Insomnia is a sleep disorder that makes it difficult to fall asleep or stay asleep. Insomnia can cause fatigue, low energy, difficulty concentrating, mood swings, and poor performance at work or school. There are three different ways to classify insomnia:  Difficulty falling asleep.  Difficulty staying asleep.  Waking up too early in the  morning. Any type of insomnia can be long-term (chronic) or short-term (acute). Both are common. Short-term insomnia usually lasts for three months or less. Chronic insomnia occurs at least three times a week for longer than three months. What are the causes? Insomnia may be caused by another condition, situation, or substance, such as:  Anxiety.  Certain medicines.  Gastroesophageal reflux disease (GERD) or other gastrointestinal conditions.  Asthma or other breathing conditions.  Restless legs syndrome, sleep apnea, or other sleep disorders.  Chronic pain.  Menopause.  Stroke.  Abuse of alcohol, tobacco, or illegal drugs.  Mental health conditions, such as depression.  Caffeine.  Neurological disorders, such as Alzheimer's disease.  An overactive thyroid (hyperthyroidism). Sometimes, the cause of insomnia may not be known. What increases the risk? Risk factors for insomnia include:  Gender. Women are affected more often than men.  Age. Insomnia is more common as you get older.  Stress.  Lack of exercise.  Irregular work schedule or working night shifts.  Traveling between different time zones.  Certain medical and mental health conditions. What are the signs or symptoms? If you have insomnia, the main symptom is having trouble falling asleep or having trouble staying asleep. This may lead to other symptoms, such as:  Feeling fatigued or having low energy.  Feeling nervous about going to sleep.  Not feeling rested in the morning.  Having trouble concentrating.  Feeling irritable, anxious, or depressed. How is this diagnosed? This condition may be diagnosed based on:  Your symptoms and medical history. Your health care provider may ask about: ? Your sleep habits. ? Any medical conditions you have. ? Your mental health.  A physical exam. How is this treated? Treatment for insomnia depends on the cause. Treatment may focus on treating an underlying  condition that is causing insomnia. Treatment may also include:  Medicines to help you sleep.  Counseling or therapy.  Lifestyle adjustments to help you sleep better. Follow these instructions at home: Eating and drinking   Limit or avoid alcohol, caffeinated beverages, and cigarettes, especially close to bedtime. These can disrupt your sleep.  Do not eat a large meal or eat spicy foods right before bedtime. This can lead to digestive discomfort that can make it hard for you to sleep. Sleep habits   Keep a sleep diary to help you and your health care provider figure out what could be causing your insomnia. Write down: ? When you sleep. ? When you wake up during the night. ? How well you sleep. ? How rested you feel the next day. ? Any side effects of medicines you are taking. ? What you eat and drink.  Make your bedroom a dark, comfortable place where it is easy to fall asleep. ? Put up shades or blackout curtains to block light from outside. ? Use a white noise machine to block noise. ? Keep the temperature cool.  Limit screen use before bedtime. This includes: ? Watching TV. ? Using your smartphone, tablet, or computer.  Stick to a routine that includes going to bed and waking up at the same times every day and night. This can help you fall asleep faster. Consider making a quiet activity, such as reading, part of your nighttime routine.  Try to avoid taking naps during the day so that you sleep better at night.  Get out of bed if you are still awake after 15 minutes of trying to sleep. Keep the lights down, but try reading or doing a quiet activity. When you feel sleepy, go back to bed. General instructions  Take over-the-counter and prescription medicines only as told by your health care provider.  Exercise regularly, as told by your health care provider. Avoid exercise starting several hours before bedtime.  Use relaxation techniques to manage stress. Ask your health  care provider to suggest some techniques that may work well for you. These may include: ? Breathing exercises. ? Routines to release muscle tension. ? Visualizing peaceful scenes.  Make sure that you drive carefully. Avoid driving if you feel very sleepy.  Keep all follow-up visits as told by your health care provider. This is important. Contact a health care provider if:  You are tired throughout the day.  You have trouble in your daily routine due to sleepiness.  You continue to have sleep problems, or your sleep problems get worse. Get help right away if:  You have serious thoughts about hurting yourself or someone else. If you ever feel like you may hurt yourself or others, or have thoughts about taking your own life, get help right away. You can go to your nearest emergency department or call:  Your local emergency services (911 in the [place]  A suicide crisis helpline, such as the [place] at 562 190 8285. This is open 24 hours a day. Summary  Insomnia is a sleep disorder that makes it difficult to fall asleep or  stay asleep.  Insomnia can be long-term (chronic) or short-term (acute).  Treatment for insomnia depends on the cause. Treatment may focus on treating an underlying condition that is causing insomnia.  Keep a sleep diary to help you and your health care provider figure out what could be causing your insomnia. This information is not intended to replace advice given to you by your health care provider. Make sure you discuss any questions you have with your health care provider. Document Revised: 06/04/2017 Document Reviewed: 04/01/2017 Elsevier Patient Education  2020 [employer].

## 2019-12-18 NOTE — Chronic Care Management (AMB) (Signed)
Chronic Care Management Pharmacy  Name: Julie Norton  MRN: [id number] DOB: 1930-01-25  Initial Questions: 1. Have you seen any other providers since your last visit? NA 2. Any changes in your medicines or health? No   Chief Complaint/ HPI  Julie Norton,  86 y.o. , female presents for their Initial CCM visit with the clinical pharmacist via telephone due to COVID-19 Pandemic.  Patient presented with spouse. Patient states she cannot leave him alone since he was expected to pass away in January, but health improved. He still presents with memory issues. She states biggest concern is cost for [place] and Eliquis.   PCP : Eulas Post, MD  Their chronic conditions include: Afib, DM, HLD, GERD, Hypothyroidism, Diastolic heart failure, Osteoarthritis, low vitamin B12 level  Office Visits: 12/06/2019- [name], MD- Patient presented for office visit for 2 week history of reported pain of right lower quadrant abdomen with some radiation toward back. Broad differential. Check urine dipstick and CBC. Patient to consider Miralax for constipation. Patient to follow up with worsening pain.   11/13/2019- [name], MD- Patient presented for office visit for complaints of: feeling off balance, blepharoptosis bilaterally affecting vision, chronic back pain. Home PT to be set up for balance improvement. Name of local ophthalmology given to patient. Patient to follow up in 3 months for follow up and recheck A1c.   10/27/2019- [name], MD- Patient presented for complaint of urine frequency and burning with urination, elevated FBGs, and feeling off balance. Urine culture. Patient to start Keflex for 5 days, Patient to titrate Tresiba to 22 units daily and continue titrating for FBGs >130. Patient to follow up in 3 months.   Consult Visit: 12/15/2019- Cardiology- Peter Martinique, MD- Patient presented for office visit for follow up of atrial fibrillation. Patient maintaining NSR.  Patient noted nausea improved with reduction of amiodarone to 200mg  daily.  Patient to follow up in 6 months.   Medications: Outpatient Encounter Medications as of 12/18/2019  Medication Sig  . acetaminophen (TYLENOL) 500 MG tablet Take 1,000 mg by mouth every 6 (six) hours as needed for mild pain or moderate pain.   Marland Kitchen amiodarone (PACERONE) 200 MG tablet Take 1 tablet (200 mg total) by mouth daily.  Marland Kitchen apixaban (ELIQUIS) 5 MG TABS tablet Take 1 tablet (5 mg total) by mouth 2 (two) times daily.  Marland Kitchen atorvastatin (LIPITOR) 10 MG tablet TAKE ONE-HALF TABLET BY  MOUTH EVERY OTHER DAY OR AS DIRECTED  . CALCIUM PO Take 1 tablet by mouth daily.  . [facility] D3 125 MCG (5000 UT) capsule TAKE 1 CAPSULE BY MOUTH EVERY DAY  . furosemide (LASIX) 40 MG tablet Take 1 tablet (40 mg total) by mouth daily.  Marland Kitchen glucose blood (ONE TOUCH ULTRA TEST) test strip Check once daily. E11.40  . Insulin Pen Needle 29G X 5MM MISC Use once daily  . levothyroxine (SYNTHROID) 50 MCG tablet TAKE 1 TABLET BY MOUTH  DAILY  . loratadine (CLARITIN) 10 MG tablet Take 10 mg by mouth daily as needed for allergies.  . metFORMIN (GLUCOPHAGE-XR) 500 MG 24 hr tablet TAKE 1 TABLET BY MOUTH  TWICE DAILY  . metoprolol succinate (TOPROL XL) 25 MG 24 hr tablet Take 1 tablet (25 mg total) by mouth daily.  Marland Kitchen omeprazole (PRILOSEC) 20 MG capsule TAKE 1 CAPSULE BY MOUTH  TWICE DAILY  . ONE TOUCH ULTRA TEST test strip CHECK ONCE A DAY  . traZODone (DESYREL) 50 MG tablet TAKE 1/2 TO 1 TABLET BY  MOUTH AT BEDTIME AS NEEDED FOR SLEEP  . TRESIBA FLEXTOUCH 100 UNIT/ML SOPN FlexTouch Pen INJECT 24 UNITS TOTAL INTO THE SKIN DAILY AT 10PM (Patient taking differently: Inject 17 Units into the skin at bedtime. )  . vitamin B-12 (CYANOCOBALAMIN) 1000 MCG tablet Take 1,000 mcg by mouth daily.  . cephALEXin (KEFLEX) 500 MG capsule Take 1 capsule (500 mg total) by mouth 3 (three) times daily. (Patient not taking: Reported on 12/18/2019)  . ciprofloxacin (CIPRO) 500 MG  tablet Take 1 tablet (500 mg total) by mouth 2 (two) times daily. (Patient not taking: Reported on 12/18/2019)   No facility-administered encounter medications on file as of 12/18/2019.     Current Diagnosis/Assessment:  Goals Addressed            This Visit's Progress   . Pharmacy Care Plan       CARE PLAN ENTRY  Current Barriers:  . Chronic Disease Management support, education, and care coordination needs related to Hyperlipidemia, Diabetes, Atrial Fibrillation, Heart Failure, GERD, Hypothyroidism, and low vitamin B12 level, side pain, chronic insomnia   Hyperlipidemia . Pharmacist Clinical Goal(s): o Over the next 90 days, patient will work with PharmD and providers to maintain LDL goal < 70 . Current regimen:  o Atorvastatin 10mg , 1 tablet once daily (0.5 tablet every other day)  . Patient self care activities - Over the next 90 days, patient will: o Continue current medications   Diabetes . Pharmacist Clinical Goal(s): o Over the next 90 days, patient will work with PharmD and providers to achieve A1c goal <7% . Current regimen:   Metformin ER 500mg , 1 tablet twice daily  Tresiba Flextouch, inject 24 units once dily at 10 PM (right now using 16-17 units)  . Interventions: o We discussed: how to recognize and treat signs of hypoglycemia and titration schedule per Dr. Elease Hashimoto  . Patient self care activities - Over the next 90 days, patient will: o Check blood sugar once daily, document, and provide at future appointments o Contact provider with any episodes of hypoglycemia  Atrial fibrillation . Pharmacist Clinical Goal(s) o Over the next 90 days, patient will work with PharmD and providers to maintain in normal sinus rhythm.  . Current regimen:   Amiodarone 200mg , 1 tablet once daily  metprolol succinate (Toprol XL) 25mg , 1 tablet once daily   apixaban (Eliquis) 5mg  ,1 tablet twice daily  . Interventions: o We discussed: monitoring for signs and symptoms for  bleeding (coughing up blood, prolonged nose bleeds, black, tarry stools). . Patient self care activities - Over the next 90 days, patient will: o Continue current medications as instructed.   Heart failure . Pharmacist Clinical Goal(s) o Over the next 90 days, patient will work with PharmD and providers to maintain weight stable and minimize fluid retention. . Current regimen:   Metoprolol succinate (Toprol XL) 25mg , 1 tablet once daily   Furosemide 40mg , 1 tablet once daily  . Interventions: o Recommended the latest to take furosemide (Lasix) is 6pm.  . Patient self care activities o Patient will continue current medications as instructed.   Hypothyroidism . Pharmacist Clinical Goal(s) o Over the next 90 days, patient will work with PharmD and providers to maintain TSH: 0.450 - 4.500 uIU/mL . Current regimen:  o Levothyroxine 68mcg, 1 tablet once daily  . Interventions: o We discussed:  administration of levothyroxine on an empty stomach, at least 30 minutes before first meal  . Patient self care activities o Patient will continue current medications.  GERD  . Pharmacist Clinical Goal(s) o Over the next 90 days, patient will work with PharmD and providers to minimize acid reflux symptoms.  . Current regimen:  o Omeprazole 20mg , 1 capsule twice daily  . Patient self care activities o Patient will continue current medication.   Low vitamin B12 . Pharmacist Clinical Goal(s) o Over the next 90 days, patient will work with PharmD and providers to maintain levels: 211 - 911 pg/mL . Current regimen:  o Vitamin B12 (cyanocobalamin) 1000 units, 1 tablet once daily  . Interventions: o We discussed: medications that can decrease vitamin B-12 absorption (metformin and long-term intake of omeprazole).  . Patient self care activities o Patient will continue current medication as instructed.   Side pain . Pharmacist Clinical Goal(s) o Over the next 90 days, patient will work with  PharmD and providers to minimize pain  . Current regimen:  o APAP 500mg , 1 tablets every six hours as needed for mild pain or moderate pain . Interventions: o  The maximum daily dose of acetaminophen was discussed with the patient. She was encouraged not to exceed 4,000 mg of acetaminophen during a 24 hour period and was asked to keep in mind that acetaminophen can also be found in many over-the-counter cold medications as well as narcotics that may be given for pain. . Patient self care activities o Patient will continue current medications as instructed.  Insomnia . Pharmacist Clinical Goal(s) o Over the next 90 days, patient will work with PharmD and providers to improve sleep . Current regimen:  o Trazodone 50mg , 0.5 (one-half) to 1 tablet at bedtime as needed for sleep . Interventions: o Recommend retrial of melatonin extended-release.  . Patient self care activities o Patient will continue current medications as instructed.   Medication management . Pharmacist Clinical Goal(s): o Over the next 90 days, patient will work with PharmD and providers to maintain optimal medication adherence . [facility]  . Interventions o Comprehensive medication review performed. o Continue current medication management strategy . Patient self care activities - Over the next 90 days, patient will: o Take medications as prescribed o Report any questions or concerns to PharmD and/or provider(s)  Initial goal documentation       SDOH Interventions     Most Recent Value  SDOH Interventions  Financial Strain Interventions Other (Comment)  [Patient does not qualify for Extra Help. Patient to complete patient assistance program for Eliquis and Tresiba.]  Transportation Interventions Intervention Not Indicated       AFIB   Patient is currently rhythm and rate controlled. BP: 120/60 mmHg  HR: 60 BPM  Patient has failed these meds in past: Flecainide (nausea)  Patient is  currently controlled on the following medications:   Amiodarone 200mg , 1 tablet once daily  metprolol succinate (Toprol XL) 25mg , 1 tablet once daily  Patient reports having 3 cardioversion in the past, last one being on 09/2019  Anticoagulation:  CHA2DS2/VAS Stroke Risk Points    5 Points: High Risk    Details    This score determines the patient's risk of having a stroke if the  patient has atrial fibrillation.     Points Metrics  1 Has Congestive Heart Failure:  Yes    Current as of 27 minutes ago  0 Has Vascular Disease:  No    Current as of 27 minutes ago  0 Has Hypertension:  No    Current as of 27 minutes ago  2 Age:  86  Current as of 27 minutes ago  1 Has Diabetes:  Yes    Current as of 27 minutes ago  0 Had Stroke:  No  Had TIA:  No  Had thromboembolism:  No    Current as of 27 minutes ago  1 Female:  Yes    Current as of 27 minutes ago      apixaban (Eliquis) 5mg  ,1 tablet twice daily  (dosing appropriate based on age, weight, and serum creatinine)   We discussed:  monitoring for signs and symptoms for bleeding (coughing up blood, prolonged nose bleeds, black, tarry stools).  Plan Managed by cardiology (Dr. Martinique) Continue current medications  Patient to fill out patient assistance program for Eliquis.   Diabetes   Recent Relevant Labs: Lab Results  Component Value Date/Time   HGBA1C 8.3 (A) 10/27/2019 10:26 AM   HGBA1C 8.1 (H) 06/23/2019 08:25 AM   HGBA1C 7.3 (A) 12/21/2018 07:43 AM   HGBA1C 9.8 (H) 02/23/2017 03:38 PM   MICROALBUR 0.9 04/07/2013 07:58 AM    Checking BG: Daily  Recent FBG Readings:97 (feels hypoglycemic) 127; 127, 140, 150   Patient has failed these meds in past: glimepiride, Invokana (yeast infections)   Patient is currently uncontrolled on the following medications:   Metformin ER 500mg , 1 tablet twice daily  Tresiba Flextouch, inject 24 units once dily at 10 PM (right now using 16-17 units)   Last diabetic Foot exam:   Lab Results  Component Value Date/Time   HMDIABEYEEXA No Retinopathy 07/29/2018 12:00 AM    Last diabetic Eye exam:  Lab Results  Component Value Date/Time   HMDIABFOOTEX normal 08/28/2014 12:00 AM    We discussed: how to recognize and treat signs of hypoglycemia and titration schedule per Dr. Elease Hashimoto  - patient reports she has now increased and decreased insulin by 2 units every 3 days based on FBG readings   Plan Continue current medications  Patient to fill out patient assistance forms for [place].   Heart Failure  Patient endorses swelling is under controlled at present.   Type: Diastolic  Last ejection fraction: 50 to 55% (10/28/2017)   NYHA Class: II (slight limitation of activity)  Patient is currently controlled on the following medications:   Metoprolol succinate (Toprol XL) 25mg , 1 tablet once daily   Furosemide 40mg , 1 tablet once daily   Plan Continue current medications  Hyperlipidemia   Lipid Panel     Component Value Date/Time   CHOL 94 06/23/2019 0825   CHOL 135 04/26/2017 0840   TRIG 70.0 06/23/2019 0825   HDL 35.20 (L) 06/23/2019 0825   HDL 45 04/26/2017 0840   LDLCALC 45 06/23/2019 0825   LDLCALC 70 04/26/2017 0840     The ASCVD Risk score (Goff DC Jr., et al., 2013) failed to calculate for the following reasons:   The 2013 ASCVD risk score is only valid for ages 25 to 64   Patient is currently controlled on the following medications:  . Atorvastatin 10mg , 1 tablet once daily (0.5 tablet every other day)   Plan Continue current medications  Hypothyroidism   Lab Results  Component Value Date/Time   TSH 2.690 12/11/2019 02:54 PM   TSH 3.210 09/04/2019 12:49 PM   TSH 3.03 12/21/2018 08:00 AM   Patient is currently controlled on the following medications:  . Levothyroxine 39mcg, 1 tablet once daily   We discussed:  administration of levothyroxine on an empty stomach, at least 30 minutes before first meal   Plan Continue  current  medications   GERD   Patient is currently controlled on the following medications:   Omeprazole 20mg , 1 capsule twice daily   Plan Continue current medications  Assess at follow up PPI taper.   Osteoarthritis   Patient is currently controlled on the following medications:  . APAP 500mg , 2 tablets every six hours as needed for mild pain or moderate pain   We discussed: The maximum daily dose of acetaminophen was discussed with the patient. She was encouraged not to exceed 4,000 mg of acetaminophen during a 24 hour period and was asked to keep in mind that acetaminophen can also be found in many over-the-counter cold medications as well as narcotics that may be given for pain. The patient expresses understanding of these issues and questions were answered.  Plan Continue current medications  Vitamin D deficiency (resolved)    Vitamin D, 25-Hydroxy: 32 (09/07/2011)  Patient is currently controlled on the following medications:  Marland Kitchen Vitamin D3 5000 units, 1 capsule once daily   Plan Continue current medications  Allergic rhinitis    Patient is currently controlled on the following medications:  . Loratadine 10mg ,1 tablet once daily as needed for allergies   Plan Continue current medications  Insomnia   Patient reports still having difficulties maintaining sleep. Patient willing to try melatonin again.   Patient is currently uncontrolled on the following medications:  . Trazodone 50mg , 0.5 (one-half) to 1 tablet at bedtime as needed for sleep   We discussed:  non-pharmacological interventions for insomnia. (Avoid napping, limit exposure to technology near bedtime, etc)  Plan Continue current medications  Recommend retrial of extended- release melantonin.   Low Vitamin B12   Vitamin B12:  163 (12/21/2018)  372 (05/15/2019)   Patient is currently controlled on the following medications:  Marland Kitchen Vitamin B12 (cyanocobalamin) 1000 units, 1 tablet once daily   Plan Continue  current medications   Medication Management  Patient organizes medications: has pill box.  [facility]  Adherence: no gaps in refill history (per medication dispense history from 06/21/2019 to 12/18/2019)   Patient interested in [facility] enhanced services, but would first like to try patient assistance program for Eliquis and Tresiba prior to switching to [facility].   - amiodarone ([facility])  - furosemide ([facility])  - metoprolol ([facility])  - trazodone ([facility])     Follow up Follow up visit with PharmD in 3 months.   Anson Crofts, PharmD Clinical Pharmacist [facility] [place] [phone]

## 2019-12-18 NOTE — Telephone Encounter (Signed)
Referral has been placed. 

## 2019-12-22 DIAGNOSIS — Z794 Long term (current) use of insulin: Secondary | ICD-10-CM | POA: Diagnosis not present

## 2019-12-22 DIAGNOSIS — H02403 Unspecified ptosis of bilateral eyelids: Secondary | ICD-10-CM | POA: Diagnosis not present

## 2019-12-22 DIAGNOSIS — E1142 Type 2 diabetes mellitus with diabetic polyneuropathy: Secondary | ICD-10-CM | POA: Diagnosis not present

## 2019-12-22 DIAGNOSIS — E039 Hypothyroidism, unspecified: Secondary | ICD-10-CM | POA: Diagnosis not present

## 2019-12-22 DIAGNOSIS — E1165 Type 2 diabetes mellitus with hyperglycemia: Secondary | ICD-10-CM | POA: Diagnosis not present

## 2019-12-22 DIAGNOSIS — Z853 Personal history of malignant neoplasm of breast: Secondary | ICD-10-CM | POA: Diagnosis not present

## 2019-12-22 DIAGNOSIS — M7521 Bicipital tendinitis, right shoulder: Secondary | ICD-10-CM | POA: Diagnosis not present

## 2019-12-22 DIAGNOSIS — M549 Dorsalgia, unspecified: Secondary | ICD-10-CM | POA: Diagnosis not present

## 2019-12-22 DIAGNOSIS — Z7901 Long term (current) use of anticoagulants: Secondary | ICD-10-CM | POA: Diagnosis not present

## 2019-12-22 DIAGNOSIS — G8929 Other chronic pain: Secondary | ICD-10-CM | POA: Diagnosis not present

## 2019-12-22 DIAGNOSIS — I4891 Unspecified atrial fibrillation: Secondary | ICD-10-CM | POA: Diagnosis not present

## 2019-12-22 DIAGNOSIS — Z9181 History of falling: Secondary | ICD-10-CM | POA: Diagnosis not present

## 2019-12-22 DIAGNOSIS — E785 Hyperlipidemia, unspecified: Secondary | ICD-10-CM | POA: Diagnosis not present

## 2019-12-23 ENCOUNTER — Other Ambulatory Visit: Payer: Self-pay | Admitting: Family Medicine

## 2019-12-25 ENCOUNTER — Telehealth: Payer: Self-pay | Admitting: Family Medicine

## 2019-12-25 ENCOUNTER — Telehealth: Payer: Self-pay

## 2019-12-25 DIAGNOSIS — E1142 Type 2 diabetes mellitus with diabetic polyneuropathy: Secondary | ICD-10-CM | POA: Diagnosis not present

## 2019-12-25 DIAGNOSIS — M7521 Bicipital tendinitis, right shoulder: Secondary | ICD-10-CM | POA: Diagnosis not present

## 2019-12-25 DIAGNOSIS — H02403 Unspecified ptosis of bilateral eyelids: Secondary | ICD-10-CM | POA: Diagnosis not present

## 2019-12-25 DIAGNOSIS — M549 Dorsalgia, unspecified: Secondary | ICD-10-CM | POA: Diagnosis not present

## 2019-12-25 DIAGNOSIS — Z794 Long term (current) use of insulin: Secondary | ICD-10-CM | POA: Diagnosis not present

## 2019-12-25 DIAGNOSIS — E1165 Type 2 diabetes mellitus with hyperglycemia: Secondary | ICD-10-CM | POA: Diagnosis not present

## 2019-12-25 DIAGNOSIS — Z9181 History of falling: Secondary | ICD-10-CM | POA: Diagnosis not present

## 2019-12-25 DIAGNOSIS — I4891 Unspecified atrial fibrillation: Secondary | ICD-10-CM | POA: Diagnosis not present

## 2019-12-25 DIAGNOSIS — E039 Hypothyroidism, unspecified: Secondary | ICD-10-CM | POA: Diagnosis not present

## 2019-12-25 DIAGNOSIS — E785 Hyperlipidemia, unspecified: Secondary | ICD-10-CM | POA: Diagnosis not present

## 2019-12-25 DIAGNOSIS — G8929 Other chronic pain: Secondary | ICD-10-CM | POA: Diagnosis not present

## 2019-12-25 DIAGNOSIS — Z7901 Long term (current) use of anticoagulants: Secondary | ICD-10-CM | POA: Diagnosis not present

## 2019-12-25 DIAGNOSIS — Z853 Personal history of malignant neoplasm of breast: Secondary | ICD-10-CM | POA: Diagnosis not present

## 2019-12-25 NOTE — Telephone Encounter (Signed)
Please advise 

## 2019-12-25 NOTE — Telephone Encounter (Signed)
Completed patient assistance documentation for [employer] (Eliquis) and NovoNordisk [name]).   Pending items [place] - patient information - provider signature - copies of income  NovoNordisk - patient information - provider signature - copies of income   Application mailed to patient. Instructed patient to bring back requested items. Once received will fax to corresponding manufacturers.   Anson Crofts, PharmD Clinical Pharmacist [facility] [place] [phone]

## 2019-12-25 NOTE — Telephone Encounter (Signed)
OK to extend PT.  She had CT abdomen and pelvis last September which was unrevealing.  I would consider follow up with GI for persistent symptoms.

## 2019-12-25 NOTE — Telephone Encounter (Signed)
Julie Norton is calling in wanting verbal orders PT to extend Home health once a week for 3 weeks effective 01/01/2020.  Pt has been complaining about pain on the R side of her trunk and extending to her abdomen and has been seen for it and would like to see if they need to have a referral to a specialist.  Pt stated that she is staying up at night due to the pain.  Julie Norton would like to have a call back if she does not answer you can leave a message on her voice mail.

## 2019-12-25 NOTE — Telephone Encounter (Signed)
PT orders given to Sandy and told if persistent to follow up with Gi

## 2019-12-29 DIAGNOSIS — Z9181 History of falling: Secondary | ICD-10-CM | POA: Diagnosis not present

## 2019-12-29 DIAGNOSIS — E1142 Type 2 diabetes mellitus with diabetic polyneuropathy: Secondary | ICD-10-CM | POA: Diagnosis not present

## 2019-12-29 DIAGNOSIS — Z853 Personal history of malignant neoplasm of breast: Secondary | ICD-10-CM | POA: Diagnosis not present

## 2019-12-29 DIAGNOSIS — Z794 Long term (current) use of insulin: Secondary | ICD-10-CM | POA: Diagnosis not present

## 2019-12-29 DIAGNOSIS — G8929 Other chronic pain: Secondary | ICD-10-CM | POA: Diagnosis not present

## 2019-12-29 DIAGNOSIS — H02403 Unspecified ptosis of bilateral eyelids: Secondary | ICD-10-CM | POA: Diagnosis not present

## 2019-12-29 DIAGNOSIS — M7521 Bicipital tendinitis, right shoulder: Secondary | ICD-10-CM | POA: Diagnosis not present

## 2019-12-29 DIAGNOSIS — E039 Hypothyroidism, unspecified: Secondary | ICD-10-CM | POA: Diagnosis not present

## 2019-12-29 DIAGNOSIS — E1165 Type 2 diabetes mellitus with hyperglycemia: Secondary | ICD-10-CM | POA: Diagnosis not present

## 2019-12-29 DIAGNOSIS — E785 Hyperlipidemia, unspecified: Secondary | ICD-10-CM | POA: Diagnosis not present

## 2019-12-29 DIAGNOSIS — Z7901 Long term (current) use of anticoagulants: Secondary | ICD-10-CM | POA: Diagnosis not present

## 2019-12-29 DIAGNOSIS — I4891 Unspecified atrial fibrillation: Secondary | ICD-10-CM | POA: Diagnosis not present

## 2019-12-29 DIAGNOSIS — M549 Dorsalgia, unspecified: Secondary | ICD-10-CM | POA: Diagnosis not present

## 2020-01-02 DIAGNOSIS — Z853 Personal history of malignant neoplasm of breast: Secondary | ICD-10-CM | POA: Diagnosis not present

## 2020-01-02 DIAGNOSIS — E785 Hyperlipidemia, unspecified: Secondary | ICD-10-CM | POA: Diagnosis not present

## 2020-01-02 DIAGNOSIS — Z794 Long term (current) use of insulin: Secondary | ICD-10-CM | POA: Diagnosis not present

## 2020-01-02 DIAGNOSIS — Z9181 History of falling: Secondary | ICD-10-CM | POA: Diagnosis not present

## 2020-01-02 DIAGNOSIS — E039 Hypothyroidism, unspecified: Secondary | ICD-10-CM | POA: Diagnosis not present

## 2020-01-02 DIAGNOSIS — G8929 Other chronic pain: Secondary | ICD-10-CM | POA: Diagnosis not present

## 2020-01-02 DIAGNOSIS — E1165 Type 2 diabetes mellitus with hyperglycemia: Secondary | ICD-10-CM | POA: Diagnosis not present

## 2020-01-02 DIAGNOSIS — Z7901 Long term (current) use of anticoagulants: Secondary | ICD-10-CM | POA: Diagnosis not present

## 2020-01-02 DIAGNOSIS — I4891 Unspecified atrial fibrillation: Secondary | ICD-10-CM | POA: Diagnosis not present

## 2020-01-02 DIAGNOSIS — H02403 Unspecified ptosis of bilateral eyelids: Secondary | ICD-10-CM | POA: Diagnosis not present

## 2020-01-02 DIAGNOSIS — E1142 Type 2 diabetes mellitus with diabetic polyneuropathy: Secondary | ICD-10-CM | POA: Diagnosis not present

## 2020-01-02 DIAGNOSIS — M549 Dorsalgia, unspecified: Secondary | ICD-10-CM | POA: Diagnosis not present

## 2020-01-02 DIAGNOSIS — M7521 Bicipital tendinitis, right shoulder: Secondary | ICD-10-CM | POA: Diagnosis not present

## 2020-01-03 NOTE — Telephone Encounter (Signed)
Reviewed chart to review if patient application has been received.   Coordinated patient assistance forms for provider to sign.   Plan to follow up with patient within 1 week to assess pending items for applications and offer assistance if needed.

## 2020-01-04 DIAGNOSIS — Z794 Long term (current) use of insulin: Secondary | ICD-10-CM | POA: Diagnosis not present

## 2020-01-04 DIAGNOSIS — E039 Hypothyroidism, unspecified: Secondary | ICD-10-CM | POA: Diagnosis not present

## 2020-01-04 DIAGNOSIS — I4891 Unspecified atrial fibrillation: Secondary | ICD-10-CM | POA: Diagnosis not present

## 2020-01-04 DIAGNOSIS — Z9181 History of falling: Secondary | ICD-10-CM | POA: Diagnosis not present

## 2020-01-04 DIAGNOSIS — E1142 Type 2 diabetes mellitus with diabetic polyneuropathy: Secondary | ICD-10-CM | POA: Diagnosis not present

## 2020-01-04 DIAGNOSIS — M549 Dorsalgia, unspecified: Secondary | ICD-10-CM | POA: Diagnosis not present

## 2020-01-04 DIAGNOSIS — G8929 Other chronic pain: Secondary | ICD-10-CM | POA: Diagnosis not present

## 2020-01-04 DIAGNOSIS — Z7901 Long term (current) use of anticoagulants: Secondary | ICD-10-CM | POA: Diagnosis not present

## 2020-01-04 DIAGNOSIS — Z853 Personal history of malignant neoplasm of breast: Secondary | ICD-10-CM | POA: Diagnosis not present

## 2020-01-04 DIAGNOSIS — E785 Hyperlipidemia, unspecified: Secondary | ICD-10-CM | POA: Diagnosis not present

## 2020-01-04 DIAGNOSIS — E1165 Type 2 diabetes mellitus with hyperglycemia: Secondary | ICD-10-CM | POA: Diagnosis not present

## 2020-01-04 DIAGNOSIS — M7521 Bicipital tendinitis, right shoulder: Secondary | ICD-10-CM | POA: Diagnosis not present

## 2020-01-04 DIAGNOSIS — H02403 Unspecified ptosis of bilateral eyelids: Secondary | ICD-10-CM | POA: Diagnosis not present

## 2020-01-10 DIAGNOSIS — E1165 Type 2 diabetes mellitus with hyperglycemia: Secondary | ICD-10-CM | POA: Diagnosis not present

## 2020-01-10 DIAGNOSIS — Z853 Personal history of malignant neoplasm of breast: Secondary | ICD-10-CM | POA: Diagnosis not present

## 2020-01-10 DIAGNOSIS — M549 Dorsalgia, unspecified: Secondary | ICD-10-CM | POA: Diagnosis not present

## 2020-01-10 DIAGNOSIS — Z9181 History of falling: Secondary | ICD-10-CM | POA: Diagnosis not present

## 2020-01-10 DIAGNOSIS — M7521 Bicipital tendinitis, right shoulder: Secondary | ICD-10-CM | POA: Diagnosis not present

## 2020-01-10 DIAGNOSIS — E039 Hypothyroidism, unspecified: Secondary | ICD-10-CM | POA: Diagnosis not present

## 2020-01-10 DIAGNOSIS — I4891 Unspecified atrial fibrillation: Secondary | ICD-10-CM | POA: Diagnosis not present

## 2020-01-10 DIAGNOSIS — G8929 Other chronic pain: Secondary | ICD-10-CM | POA: Diagnosis not present

## 2020-01-10 DIAGNOSIS — Z7901 Long term (current) use of anticoagulants: Secondary | ICD-10-CM | POA: Diagnosis not present

## 2020-01-10 DIAGNOSIS — Z794 Long term (current) use of insulin: Secondary | ICD-10-CM | POA: Diagnosis not present

## 2020-01-10 DIAGNOSIS — E785 Hyperlipidemia, unspecified: Secondary | ICD-10-CM | POA: Diagnosis not present

## 2020-01-10 DIAGNOSIS — E1142 Type 2 diabetes mellitus with diabetic polyneuropathy: Secondary | ICD-10-CM | POA: Diagnosis not present

## 2020-01-10 DIAGNOSIS — H02403 Unspecified ptosis of bilateral eyelids: Secondary | ICD-10-CM | POA: Diagnosis not present

## 2020-01-12 DIAGNOSIS — E1165 Type 2 diabetes mellitus with hyperglycemia: Secondary | ICD-10-CM | POA: Diagnosis not present

## 2020-01-12 DIAGNOSIS — E039 Hypothyroidism, unspecified: Secondary | ICD-10-CM | POA: Diagnosis not present

## 2020-01-12 DIAGNOSIS — Z853 Personal history of malignant neoplasm of breast: Secondary | ICD-10-CM | POA: Diagnosis not present

## 2020-01-12 DIAGNOSIS — E785 Hyperlipidemia, unspecified: Secondary | ICD-10-CM | POA: Diagnosis not present

## 2020-01-12 DIAGNOSIS — Z9181 History of falling: Secondary | ICD-10-CM | POA: Diagnosis not present

## 2020-01-12 DIAGNOSIS — Z794 Long term (current) use of insulin: Secondary | ICD-10-CM | POA: Diagnosis not present

## 2020-01-12 DIAGNOSIS — M7521 Bicipital tendinitis, right shoulder: Secondary | ICD-10-CM | POA: Diagnosis not present

## 2020-01-12 DIAGNOSIS — Z7901 Long term (current) use of anticoagulants: Secondary | ICD-10-CM | POA: Diagnosis not present

## 2020-01-12 DIAGNOSIS — H02403 Unspecified ptosis of bilateral eyelids: Secondary | ICD-10-CM | POA: Diagnosis not present

## 2020-01-12 DIAGNOSIS — M549 Dorsalgia, unspecified: Secondary | ICD-10-CM | POA: Diagnosis not present

## 2020-01-12 DIAGNOSIS — I4891 Unspecified atrial fibrillation: Secondary | ICD-10-CM | POA: Diagnosis not present

## 2020-01-12 DIAGNOSIS — E1142 Type 2 diabetes mellitus with diabetic polyneuropathy: Secondary | ICD-10-CM | POA: Diagnosis not present

## 2020-01-12 DIAGNOSIS — G8929 Other chronic pain: Secondary | ICD-10-CM | POA: Diagnosis not present

## 2020-01-17 DIAGNOSIS — Z9181 History of falling: Secondary | ICD-10-CM | POA: Diagnosis not present

## 2020-01-17 DIAGNOSIS — M7521 Bicipital tendinitis, right shoulder: Secondary | ICD-10-CM | POA: Diagnosis not present

## 2020-01-17 DIAGNOSIS — I4891 Unspecified atrial fibrillation: Secondary | ICD-10-CM | POA: Diagnosis not present

## 2020-01-17 DIAGNOSIS — H02403 Unspecified ptosis of bilateral eyelids: Secondary | ICD-10-CM | POA: Diagnosis not present

## 2020-01-17 DIAGNOSIS — E039 Hypothyroidism, unspecified: Secondary | ICD-10-CM | POA: Diagnosis not present

## 2020-01-17 DIAGNOSIS — Z853 Personal history of malignant neoplasm of breast: Secondary | ICD-10-CM | POA: Diagnosis not present

## 2020-01-17 DIAGNOSIS — Z794 Long term (current) use of insulin: Secondary | ICD-10-CM | POA: Diagnosis not present

## 2020-01-17 DIAGNOSIS — G8929 Other chronic pain: Secondary | ICD-10-CM | POA: Diagnosis not present

## 2020-01-17 DIAGNOSIS — E1142 Type 2 diabetes mellitus with diabetic polyneuropathy: Secondary | ICD-10-CM | POA: Diagnosis not present

## 2020-01-17 DIAGNOSIS — E1165 Type 2 diabetes mellitus with hyperglycemia: Secondary | ICD-10-CM | POA: Diagnosis not present

## 2020-01-17 DIAGNOSIS — Z7901 Long term (current) use of anticoagulants: Secondary | ICD-10-CM | POA: Diagnosis not present

## 2020-01-17 DIAGNOSIS — M549 Dorsalgia, unspecified: Secondary | ICD-10-CM | POA: Diagnosis not present

## 2020-01-17 DIAGNOSIS — E785 Hyperlipidemia, unspecified: Secondary | ICD-10-CM | POA: Diagnosis not present

## 2020-01-26 ENCOUNTER — Other Ambulatory Visit: Payer: Self-pay | Admitting: Family Medicine

## 2020-01-27 ENCOUNTER — Other Ambulatory Visit: Payer: Self-pay | Admitting: Family Medicine

## 2020-01-28 NOTE — Telephone Encounter (Signed)
Refills OK for one year. 

## 2020-02-06 DIAGNOSIS — Z794 Long term (current) use of insulin: Secondary | ICD-10-CM | POA: Diagnosis not present

## 2020-02-06 DIAGNOSIS — E119 Type 2 diabetes mellitus without complications: Secondary | ICD-10-CM | POA: Diagnosis not present

## 2020-02-07 ENCOUNTER — Other Ambulatory Visit: Payer: Self-pay | Admitting: Obstetrics & Gynecology

## 2020-02-07 DIAGNOSIS — Z1231 Encounter for screening mammogram for malignant neoplasm of breast: Secondary | ICD-10-CM

## 2020-02-12 DIAGNOSIS — M5136 Other intervertebral disc degeneration, lumbar region: Secondary | ICD-10-CM | POA: Diagnosis not present

## 2020-02-14 ENCOUNTER — Other Ambulatory Visit: Payer: Self-pay

## 2020-02-14 ENCOUNTER — Encounter: Payer: Self-pay | Admitting: Family Medicine

## 2020-02-14 ENCOUNTER — Ambulatory Visit (INDEPENDENT_AMBULATORY_CARE_PROVIDER_SITE_OTHER): Payer: Medicare Other | Admitting: Family Medicine

## 2020-02-14 VITALS — BP 120/80 | HR 44 | Temp 98.0°F | Ht 61.5 in | Wt 145.0 lb

## 2020-02-14 DIAGNOSIS — H00011 Hordeolum externum right upper eyelid: Secondary | ICD-10-CM | POA: Diagnosis not present

## 2020-02-14 DIAGNOSIS — S0083XA Contusion of other part of head, initial encounter: Secondary | ICD-10-CM

## 2020-02-14 DIAGNOSIS — I872 Venous insufficiency (chronic) (peripheral): Secondary | ICD-10-CM | POA: Diagnosis not present

## 2020-02-14 DIAGNOSIS — E1165 Type 2 diabetes mellitus with hyperglycemia: Secondary | ICD-10-CM

## 2020-02-14 MED ORDER — TRIAMCINOLONE ACETONIDE 0.1 % EX CREA
1.0000 | TOPICAL_CREAM | Freq: Two times a day (BID) | CUTANEOUS | 2 refills | Status: DC
Start: 2020-02-14 — End: 2020-09-11

## 2020-02-14 NOTE — Patient Instructions (Addendum)
If fasting blood sugars consistently > 130 may titrate the Tresiba up 2 units every few days until fasting < 130.    Stye  A stye, also known as a hordeolum, is a bump that forms on an eyelid. It may look like a pimple next to the eyelash. A stye can form inside the eyelid (internal stye) or outside the eyelid (external stye). A stye can cause redness, swelling, and pain on the eyelid. Styes are very common. Anyone can get them at any age. They usually occur in just one eye, but you may have more than one in either eye. What are the causes? A stye is caused by an infection. The infection is almost always caused by bacteria called Staphylococcus aureus. This is a common type of bacteria that lives on the skin. An internal stye may result from an infected oil-producing gland inside the eyelid. An external stye may be caused by an infection at the base of the eyelash (hair follicle). What increases the risk? You are more likely to develop a stye if:  You have had a stye before.  You have any of these conditions: ? Diabetes. ? Red, itchy, inflamed eyelids (blepharitis). ? A skin condition such as seborrheic dermatitis or rosacea. ? High fat levels in your blood (lipids). What are the signs or symptoms? The most common symptom of a stye is eyelid pain. Internal styes are more painful than external styes. Other symptoms may include:  Painful swelling of your eyelid.  A scratchy feeling in your eye.  Tearing and redness of your eye.  Pus draining from the stye. How is this diagnosed? Your health care provider may be able to diagnose a stye just by examining your eye. The health care provider may also check to make sure:  You do not have a fever or other signs of a more serious infection.  The infection has not spread to other parts of your eye or areas around your eye. How is this treated? Most styes will clear up in a few days without treatment or with warm compresses applied to the  area. You may need to use antibiotic drops or ointment to treat an infection. In some cases, if your stye does not heal with routine treatment, your health care provider may drain pus from the stye using a thin blade or needle. This may be done if the stye is large, causing a lot of pain, or affecting your vision. Follow these instructions at home:  Take over-the-counter and prescription medicines only as told by your health care provider. This includes eye drops or ointments.  If you were prescribed an antibiotic medicine, apply or use it as told by your health care provider. Do not stop using the antibiotic even if your condition improves.  Apply a warm, wet cloth (warm compress) to your eye for 5-10 minutes, 4 times a day.  Clean the affected eyelid as directed by your health care provider.  Do not wear contact lenses or eye makeup until your stye has healed.  Do not try to pop or drain the stye.  Do not rub your eye. Contact a health care provider if:  You have chills or a fever.  Your stye does not go away after several days.  Your stye affects your vision.  Your eyeball becomes swollen, red, or painful. Get help right away if:  You have pain when moving your eye around. Summary  A stye is a bump that forms on an eyelid. It  may look like a pimple next to the eyelash.  A stye can form inside the eyelid (internal stye) or outside the eyelid (external stye). A stye can cause redness, swelling, and pain on the eyelid.  Your health care provider may be able to diagnose a stye just by examining your eye.  Apply a warm, wet cloth (warm compress) to your eye for 5-10 minutes, 4 times a day. This information is not intended to replace advice given to you by your health care provider. Make sure you discuss any questions you have with your health care provider. Document Revised: 06/04/2017 Document Reviewed: 03/04/2017 Elsevier Patient Education  2020 [employer].

## 2020-02-14 NOTE — Progress Notes (Signed)
Established Patient Office Visit  Subjective:  Patient ID: Julie Norton, female    DOB: 1930/03/04  Age: 86 y.o. MRN: [id number]  CC:  Chief Complaint  Patient presents with  . Fall    Ptr fell 2 days ago. Pt stated she was sitting at the foot of the beg and tried to put her pants on and fell. Pt stated he rface is very sore but denies having a headache today    HPI Julie Norton presents for recent fall and to discuss several issues as below  On Monday night she was sitting on a small bench trying to put her pajamas on and she apparently slipped off the bench and fell striking the left side of her face.  She has some bruising around the eye.  There was no loss of consciousness.  She had some mild headache afterwards but none since then.  No confusion.  No cognitive changes.  No other injuries reported.  She has history of type 2 diabetes.  She is currently on Tresiba 18 units once daily.  Most recent A1c was 8.3%.  Recent fasting blood sugars ranging usually between 90s and 150.  She has a stye on her right upper lid.  This been present for several days.  No drainage.  No visual changes.  She has not tried any warm compresses.  She is having general fatigue issues and also reports some recent diffuse hair loss.  She had both TSH and free T4 which came back normal.  She has history of chronic venous stasis.  She has had some dry scaly rash on both feet.  She is reluctant to use compression hose because of difficulties getting these on.  She has significant varicosities which have increased over the years.  Past Medical History:  Diagnosis Date  . Abdominal adhesions   . Abdominal gas pain    suspected -- may be related to intermittent right upper quadrant discomfort radiating around her back      . Breast cancer ([place])   . Breast mass    left breast  . Cancer ([place])    left breast  . Chest pain    intermittent right upper quadrant discomfort radiating around her back        . Complication of anesthesia   . Diabetes mellitus without complication ([place])    Type II  . Dyspepsia   . GERD (gastroesophageal reflux disease)   . H/O: hysterectomy 1978  . Hypercholesteremia   . Hypothyroidism   . Personal history of radiation therapy   . Pneumonia   . PONV (postoperative nausea and vomiting)   . Tendinitis    of the right arm and shoulder  . Torn tendon    right shoulder    Past Surgical History:  Procedure Laterality Date  . ABDOMINAL HYSTERECTOMY    . APPENDECTOMY  1978  . BLADDER REPAIR    . BREAST LUMPECTOMY Left 02/24/2011   central partial mastectomy - dr Margot Chimes  . BREAST REDUCTION SURGERY    . CARDIOVERSION N/A 10/28/2017   Procedure: CARDIOVERSION;  Surgeon: Lelon Perla, MD;  Location: [facility] ENDOSCOPY;  Service: Cardiovascular;  Laterality: N/A;  . CARDIOVERSION N/A 04/06/2019   Procedure: CARDIOVERSION;  Surgeon: [name], MD;  Location: [facility] ENDOSCOPY;  Service: Cardiovascular;  Laterality: N/A;  . CARDIOVERSION N/A 09/08/2019   Procedure: CARDIOVERSION;  Surgeon: Dorothy Spark, MD;  Location: [place];  Service: Cardiovascular;  Laterality: N/A;  . COLONOSCOPY WITH PROPOFOL  N/A 03/24/2017   Procedure: COLONOSCOPY WITH PROPOFOL;  Surgeon: Wilford Corner, MD;  Location: [place];  Service: Endoscopy;  Laterality: N/A;  . ESOPHAGOGASTRODUODENOSCOPY (EGD) WITH PROPOFOL N/A 03/24/2017   Procedure: ESOPHAGOGASTRODUODENOSCOPY (EGD) WITH PROPOFOL;  Surgeon: Wilford Corner, MD;  Location: [place];  Service: Endoscopy;  Laterality: N/A;  . LIPOMA EXCISION Right   . REDUCTION MAMMAPLASTY Bilateral 1998  . RETINAL DETACHMENT SURGERY Left   . SALPINGOOPHORECTOMY    . TEE WITHOUT CARDIOVERSION N/A 10/28/2017   Procedure: TRANSESOPHAGEAL ECHOCARDIOGRAM (TEE);  Surgeon: Lelon Perla, MD;  Location: [facility] ENDOSCOPY;  Service: Cardiovascular;  Laterality: N/A;  . [place]   at 86 years of age     Family History  Problem Relation Age of Onset  . Pneumonia Father   . Heart attack Mother   . Hypertension Mother   . Stroke Mother   . Diabetes Mother   . Aortic aneurysm Mother        abdominal aortic aneurysm  . Cancer Mother        bladder  . Aortic aneurysm Sister        abdominal aortic aneurysm    Social History   Socioeconomic History  . Marital status: Married    Spouse name: Not on file  . Number of children: Not on file  . Years of education: Not on file  . Highest education level: Not on file  Occupational History  . Not on file  Tobacco Use  . Smoking status: Never Smoker  . Smokeless tobacco: Never Used  Vaping Use  . Vaping Use: Never used  Substance and Sexual Activity  . Alcohol use: No  . Drug use: No  . Sexual activity: Not on file  Other Topics Concern  . Not on file  Social History Narrative  . Not on file   Social Determinants of Health   Financial Resource Strain: Medium Risk  . Difficulty of Paying Living Expenses: Somewhat hard  Food Insecurity:   . Worried About Charity fundraiser in the Last Year:   . Arboriculturist in the Last Year:   Transportation Needs: No Transportation Needs  . Lack of Transportation (Medical): No  . Lack of Transportation (Non-Medical): No  Physical Activity:   . Days of Exercise per Week:   . Minutes of Exercise per Session:   Stress:   . Feeling of Stress :   Social Connections:   . Frequency of Communication with Friends and Family:   . Frequency of Social Gatherings with Friends and Family:   . Attends Religious Services:   . Active Member of Clubs or Organizations:   . Attends Archivist Meetings:   Marland Kitchen Marital Status:   Intimate Partner Violence:   . Fear of Current or Ex-Partner:   . Emotionally Abused:   Marland Kitchen Physically Abused:   . Sexually Abused:     Outpatient Medications Prior to Visit  Medication Sig Dispense Refill  . acetaminophen (TYLENOL) 500 MG tablet Take 1,000 mg  by mouth every 6 (six) hours as needed for mild pain or moderate pain.     Marland Kitchen amiodarone (PACERONE) 200 MG tablet Take 1 tablet (200 mg total) by mouth daily. 90 tablet 3  . apixaban (ELIQUIS) 5 MG TABS tablet Take 1 tablet (5 mg total) by mouth 2 (two) times daily. 180 tablet 3  . atorvastatin (LIPITOR) 10 MG tablet TAKE ONE-HALF TABLET BY  MOUTH EVERY OTHER DAY OR AS  DIRECTED 21 tablet 3  . CALCIUM PO Take 1 tablet by mouth daily.    . [facility] D3 125 MCG (5000 UT) capsule TAKE 1 CAPSULE BY MOUTH EVERY DAY 30 capsule 3  . furosemide (LASIX) 40 MG tablet Take 1 tablet (40 mg total) by mouth daily. 90 tablet 3  . glucose blood (ONE TOUCH ULTRA TEST) test strip Check once daily. E11.40 100 each 11  . Insulin Pen Needle 29G X 5MM MISC Use once daily 100 each 3  . levothyroxine (SYNTHROID) 50 MCG tablet TAKE 1 TABLET BY MOUTH  DAILY 90 tablet 2  . loratadine (CLARITIN) 10 MG tablet Take 10 mg by mouth daily as needed for allergies.    . metFORMIN (GLUCOPHAGE-XR) 500 MG 24 hr tablet TAKE 1 TABLET BY MOUTH  TWICE DAILY 180 tablet 1  . metoprolol succinate (TOPROL XL) 25 MG 24 hr tablet Take 1 tablet (25 mg total) by mouth daily. 90 tablet 3  . omeprazole (PRILOSEC) 20 MG capsule TAKE 1 CAPSULE BY MOUTH  TWICE DAILY 180 capsule 2  . ONE TOUCH ULTRA TEST test strip CHECK ONCE A DAY 50 each 3  . traZODone (DESYREL) 50 MG tablet TAKE 1/2 TO 1 TABLET BY MOUTH AT BEDTIME AS NEEDED FOR SLEEP 90 tablet 1  . TRESIBA FLEXTOUCH 100 UNIT/ML FlexTouch Pen INJECT 24 UNITS TOTAL INTO THE SKIN DAILY AT 10PM (Patient taking differently: Inject 18 Units into the skin at bedtime. ) 15 pen 5  . vitamin B-12 (CYANOCOBALAMIN) 1000 MCG tablet Take 1,000 mcg by mouth daily.    . cephALEXin (KEFLEX) 500 MG capsule Take 1 capsule (500 mg total) by mouth 3 (three) times daily. 15 capsule 0  . ciprofloxacin (CIPRO) 500 MG tablet Take 1 tablet (500 mg total) by mouth 2 (two) times daily. 14 tablet 0   No facility-administered  medications prior to visit.    Allergies  Allergen Reactions  . Bactrim Nausea And Vomiting  . Dilaudid [Hydromorphone Hcl] Other (See Comments)    Unknown  . Hydrocodone Nausea And Vomiting  . Invokana [Canagliflozin] Itching and Other (See Comments)    Yeast Infections   . Macrodantin Nausea And Vomiting    ROS Review of Systems  Constitutional: Positive for fatigue. Negative for chills and fever.  Respiratory: Negative for cough and shortness of breath.   Cardiovascular: Negative for chest pain.  Gastrointestinal: Negative for abdominal pain.  Genitourinary: Negative for dysuria.  Skin: Positive for rash.  Neurological: Negative for dizziness, weakness and headaches.  Hematological: Negative for adenopathy.      Objective:    Physical Exam Vitals reviewed.  Constitutional:      Appearance: Normal appearance.  HENT:     Head:     Comments: She has some ecchymosis around the left eye mostly along the inferior orbital rim.  This area is slightly tender to palpation.  Extraocular movements are intact.  No other evidence for facial or head trauma    Right Ear: Tympanic membrane normal.     Left Ear: Tympanic membrane normal.  Cardiovascular:     Rate and Rhythm: Normal rate and regular rhythm.  Pulmonary:     Effort: Pulmonary effort is normal.     Breath sounds: Normal breath sounds.  Musculoskeletal:     Cervical back: Neck supple.     Comments: She has significant varicosities in both lower extremities.  No pitting edema  Skin:    Comments: She has significant dryness and some scaling involving  the mostly lateral aspects of both feet.  Neurological:     Mental Status: She is alert.     BP 120/80   Pulse (!) 44   Temp 98 F (36.7 C) (Other (Comment))   Ht 5' 1.5" (1.562 m)   Wt 145 lb (65.8 kg)   SpO2 98%   BMI 26.95 kg/m  Wt Readings from Last 3 Encounters:  02/14/20 145 lb (65.8 kg)  12/15/19 141 lb 3.2 oz (64 kg)  12/06/19 141 lb 14.4 oz (64.4 kg)      Health Maintenance Due  Topic Date Due  . COVID-19 Vaccine (1) Never done  . URINE MICROALBUMIN  04/07/2014  . FOOT EXAM  08/29/2015  . PNA vac Low Risk Adult (2 of 2 - PPSV23) 03/03/2017  . OPHTHALMOLOGY EXAM  07/30/2019  . INFLUENZA VACCINE  02/04/2020    There are no preventive care reminders to display for this patient.  Lab Results  Component Value Date   TSH 2.690 12/11/2019   Lab Results  Component Value Date   WBC 5.4 12/06/2019   HGB 10.6 (L) 12/06/2019   HCT 31.9 (L) 12/06/2019   MCV 78.3 12/06/2019   PLT 185.0 12/06/2019   Lab Results  Component Value Date   NA 137 12/11/2019   K 4.5 12/11/2019   CHLORIDE 111 (H) 04/03/2014   CO2 20 12/11/2019   GLUCOSE 297 (H) 12/11/2019   BUN 12 12/11/2019   CREATININE 0.72 12/11/2019   BILITOT 0.4 12/11/2019   ALKPHOS 87 12/11/2019   AST 39 12/11/2019   ALT 32 12/11/2019   PROT 6.4 12/11/2019   ALBUMIN 4.1 12/11/2019   CALCIUM 9.2 12/11/2019   ANIONGAP 10 03/27/2019   GFR 70.88 06/23/2019   Lab Results  Component Value Date   CHOL 94 06/23/2019   Lab Results  Component Value Date   HDL 35.20 (L) 06/23/2019   Lab Results  Component Value Date   LDLCALC 45 06/23/2019   Lab Results  Component Value Date   TRIG 70.0 06/23/2019   Lab Results  Component Value Date   CHOLHDL 3 06/23/2019   Lab Results  Component Value Date   HGBA1C 8.3 (A) 10/27/2019      Assessment & Plan:   #1 recent fall with left facial trauma.  No loss of conscious.  No headaches.  Suspect facial contusion. -Reassurance at this time.  Follow-up for any persistent headache or other concerns  #2 Type 2 diabetes-suboptimally controlled on recent readings.  Her recent A1c is above 8.3% -We recommend she has consistent fastings over 130 to titrate her Tresiba back up 2 units every few days until consistently less than 130  #3 stye right upper lid.  We explained these are usually self resolving.  Recommend warm compresses  several times daily and gentle massage.  Ophthalmology referral if not resolving in 2 weeks  #4 venous stasis lower extremities.  She has evidence for probably some mild venous stasis dermatitis -Discussed compression hose but she is reluctant -Triamcinolone 0.1% cream twice daily as needed  Meds ordered this encounter  Medications  . triamcinolone cream (KENALOG) 0.1 %    Sig: Apply 1 application topically 2 (two) times daily.    Dispense:  30 g    Refill:  2    Follow-up: Return in about 2 months (around 04/15/2020).    [name], MD

## 2020-02-16 ENCOUNTER — Telehealth: Payer: Self-pay

## 2020-02-16 NOTE — Chronic Care Management (AMB) (Signed)
Received call from patient to follow up on patient assistance for [place] and Eliquis.   Patient stated she does not qualify for Extra Help due to income higher than $29,520.   Patient stated she also does not qualify for Eliquis patient assistance program due to income higher than $52,260.   However, patient's copay for [name] has decreased from $108 to $35/ month. Instructed patient to return call if needing assistance for Tresiba copay in the future. Tresiba copay of $35 only allowed if processed for 30 day supply.   Patient also stated she has noticed her hair falling. She reports discussing this with providers, but was not able to identify changes in shampoos or products. She also reports her thyroid ws checked and that was ruled out.   Lab Results  Component Value Date   TSH 2.690 12/11/2019   Discussed side effects of medications. Patient concerned this may be result of amiodarone since it is a new start. Patient to discuss further with Dr. [place] (cardiologist).   Plan Scheduled CCM follow up in 5 months.   Anson Crofts, PharmD, BCACP  Clinical Pharmacist [facility] [place] [phone]

## 2020-02-19 ENCOUNTER — Emergency Department (HOSPITAL_COMMUNITY)
Admission: EM | Admit: 2020-02-19 | Discharge: 2020-02-20 | Disposition: A | Discharge status: Home / Self Care | Payer: Medicare Other | Attending: Emergency Medicine | Admitting: Emergency Medicine

## 2020-02-19 ENCOUNTER — Emergency Department (HOSPITAL_COMMUNITY): Payer: Medicare Other

## 2020-02-19 ENCOUNTER — Other Ambulatory Visit: Payer: Self-pay

## 2020-02-19 ENCOUNTER — Encounter (HOSPITAL_COMMUNITY): Payer: Self-pay | Admitting: Emergency Medicine

## 2020-02-19 DIAGNOSIS — S199XXA Unspecified injury of neck, initial encounter: Secondary | ICD-10-CM | POA: Diagnosis not present

## 2020-02-19 DIAGNOSIS — J90 Pleural effusion, not elsewhere classified: Secondary | ICD-10-CM | POA: Diagnosis not present

## 2020-02-19 DIAGNOSIS — Z5321 Procedure and treatment not carried out due to patient leaving prior to being seen by health care provider: Secondary | ICD-10-CM | POA: Insufficient documentation

## 2020-02-19 DIAGNOSIS — S0240DA Maxillary fracture, left side, initial encounter for closed fracture: Secondary | ICD-10-CM | POA: Diagnosis not present

## 2020-02-19 DIAGNOSIS — S0990XA Unspecified injury of head, initial encounter: Secondary | ICD-10-CM | POA: Diagnosis not present

## 2020-02-19 DIAGNOSIS — R109 Unspecified abdominal pain: Secondary | ICD-10-CM | POA: Diagnosis not present

## 2020-02-19 DIAGNOSIS — R079 Chest pain, unspecified: Secondary | ICD-10-CM | POA: Insufficient documentation

## 2020-02-19 DIAGNOSIS — R042 Hemoptysis: Secondary | ICD-10-CM | POA: Insufficient documentation

## 2020-02-19 LAB — CBC
HCT: 31.5 % — ABNORMAL LOW (ref 36.0–46.0)
Hemoglobin: 9.4 g/dL — ABNORMAL LOW (ref 12.0–15.0)
MCH: 24.1 pg — ABNORMAL LOW (ref 26.0–34.0)
MCHC: 29.8 g/dL — ABNORMAL LOW (ref 30.0–36.0)
MCV: 80.8 fL (ref 80.0–100.0)
Platelets: 158 10*3/uL (ref 150–400)
RBC: 3.9 MIL/uL (ref 3.87–5.11)
RDW: 14 % (ref 11.5–15.5)
WBC: 4.7 10*3/uL (ref 4.0–10.5)
nRBC: 0 % (ref 0.0–0.2)

## 2020-02-19 LAB — BASIC METABOLIC PANEL
Anion gap: 8 (ref 5–15)
BUN: 15 mg/dL (ref 8–23)
CO2: 26 mmol/L (ref 22–32)
Calcium: 9.6 mg/dL (ref 8.9–10.3)
Chloride: 106 mmol/L (ref 98–111)
Creatinine, Ser: 0.72 mg/dL (ref 0.44–1.00)
GFR calc Af Amer: >60 mL/min (ref >60)
GFR calc non Af Amer: >60 mL/min (ref >60)
Glucose, Bld: 176 mg/dL — ABNORMAL HIGH (ref 70–99)
Potassium: 4.4 mmol/L (ref 3.5–5.1)
Sodium: 140 mmol/L (ref 135–145)

## 2020-02-19 LAB — TROPONIN I (HIGH SENSITIVITY): Troponin I (High Sensitivity): 10 ng/L (ref <18)

## 2020-02-19 NOTE — ED Provider Notes (Addendum)
MSE was initiated and I personally evaluated the patient and placed orders (if any) at  3:52 PM on February 19, 2020. Pt encouraged to stay for her full evaluation.  This MSE does not replace her full exam. Patient was pulled from the waiting room for my exam  Patient presented to the ED for evaluation of pain after a fall.  Patient states she stumbled and fell about a week ago.  She is on Eliquis for A. fib.  Over the last several days patient's had some intermittent pain and burning in her chest area.  She has been spitting up some blood especially after lying flat at night..  Patient has not been having any vomiting.  Patient has noticed bruising on her face.  She also has been having a slight headache.  Vital signs reviewed.  Patient's blood pressure is 133/58.  Pulse is 58.  Temp 97.9  Head: Obvious facial contusions on the left side of her face  Eyes: Pupils are equal round reactive Neck: No cervical spine tenderness Heart: Regular rate and rhythm Chest: No crepitus  Initial labs reviewed.  Patient's hemoglobin is 9.4 but this seems to be around her baseline.  Chest x-ray does not show any acute abnormalities.  Initial troponin is normal.  Patient has repeat troponin pending.  I have added on CT scans of her head face and cervical spine considering her use of Eliquis and her fall       Dorie Rank, MD 02/19/20 1600

## 2020-02-19 NOTE — ED Triage Notes (Signed)
Pt reports that she fell about week ago and saw PCP last week. Reports the past 3-4 nights around 4am will having burning in mid chest and upper abd then will spit up blood. Reports takes Eliquis for her a fib. Denies pains at this time.

## 2020-02-20 ENCOUNTER — Encounter: Payer: Self-pay | Admitting: Family Medicine

## 2020-02-20 ENCOUNTER — Telehealth (INDEPENDENT_AMBULATORY_CARE_PROVIDER_SITE_OTHER): Payer: Medicare Other | Admitting: Family Medicine

## 2020-02-20 ENCOUNTER — Telehealth: Payer: Self-pay | Admitting: Family Medicine

## 2020-02-20 DIAGNOSIS — S0240DA Maxillary fracture, left side, initial encounter for closed fracture: Secondary | ICD-10-CM

## 2020-02-20 DIAGNOSIS — H00011 Hordeolum externum right upper eyelid: Secondary | ICD-10-CM

## 2020-02-20 NOTE — ED Notes (Signed)
Pt eloped from waiting area prior to room call. Called 3X.

## 2020-02-20 NOTE — Telephone Encounter (Signed)
She has multiple questions- and we have open slots.  Put her into one of our slots (15 minutes OK) -either virtual or in office to discuss results.

## 2020-02-20 NOTE — Progress Notes (Signed)
Patient ID: Julie Norton, female   DOB: 25-Jul-1929, 86 y.o.   MRN: [id number]   This visit type was conducted due to national recommendations for restrictions regarding the COVID-19 pandemic in an effort to limit this patient's exposure and mitigate transmission in our community.   Virtual Visit via Telephone Note  I connected with Julie Norton on 02/20/20 at  4:00 PM EDT by telephone and verified that I am speaking with the correct person using two identifiers.   I discussed the limitations, risks, security and privacy concerns of performing an evaluation and management service by telephone and the availability of in person appointments. I also discussed with the patient that there may be a patient responsible charge related to this service. The patient expressed understanding and agreed to proceed.  Location patient: home Location provider: work or home office Participants present for the call: patient, provider Patient did not have a visit in the prior 7 days to address this/these issue(s).   History of Present Illness:   Julie Norton had called this morning regarding several questions from recent ER visit. She had a recent fall.   A week ago Monday she fell at home and struck the left side of her face. She has some bruising around the eye. Refer to note from 02/14/2020 for details. We explained then that she may have maxillary fracture but we decided against x-rays then and discussed that most of these are uncomplicated and heal without intervention.  She apparently coughed up little bit of blood and had some anterior chest wall discomfort and that prompted her visit to the ER. She had multiple x-rays including chest x-ray which was unremarkable. CT head no acute changes. CT cervical spine showed spondylosis changes but no acute fracture. CT maxillofacial films revealed mildly displaced fracture of the anterior wall of the left maxillary sinus. She had some opacification of the left  sinus. She is on Eliquis. She had lab work and CBC showed hemoglobin 9.4 with her baseline usually around 10.  She also had troponin levels that were normal. She states she waited for about 10 hours in the ER and then ended up leaving without being seen. She states that she has had only some mild headaches but no consistent headaches. No fever. No nausea or vomiting. No confusion. No further chest pains. No hemoptysis.  She has a stye involving her right upper lid. This has been present now for 3 weeks. Was noted at recent visit. We recommend warm compresses which she has been doing consistently without improvement. She also has some redundancy of skin upper eyelids and would like ophthalmology consult for that. We discussed possible ophthalmology consult for stye if not resolving in a couple weeks with warm compresses and gentle massage.  Past Medical History:  Diagnosis Date  . Abdominal adhesions   . Abdominal gas pain    suspected -- may be related to intermittent right upper quadrant discomfort radiating around her back      . Breast cancer ([place])   . Breast mass    left breast  . Cancer ([place])    left breast  . Chest pain    intermittent right upper quadrant discomfort radiating around her back      . Complication of anesthesia   . Diabetes mellitus without complication ([place])    Type II  . Dyspepsia   . GERD (gastroesophageal reflux disease)   . H/O: hysterectomy 1978  . Hypercholesteremia   . Hypothyroidism   . Personal  history of radiation therapy   . Pneumonia   . PONV (postoperative nausea and vomiting)   . Tendinitis    of the right arm and shoulder  . Torn tendon    right shoulder   Past Surgical History:  Procedure Laterality Date  . ABDOMINAL HYSTERECTOMY    . APPENDECTOMY  1978  . BLADDER REPAIR    . BREAST LUMPECTOMY Left 02/24/2011   central partial mastectomy - dr Margot Chimes  . BREAST REDUCTION SURGERY    . CARDIOVERSION N/A 10/28/2017   Procedure: CARDIOVERSION;   Surgeon: Lelon Perla, MD;  Location: [facility] ENDOSCOPY;  Service: Cardiovascular;  Laterality: N/A;  . CARDIOVERSION N/A 04/06/2019   Procedure: CARDIOVERSION;  Surgeon: [name], MD;  Location: [facility] ENDOSCOPY;  Service: Cardiovascular;  Laterality: N/A;  . CARDIOVERSION N/A 09/08/2019   Procedure: CARDIOVERSION;  Surgeon: Dorothy Spark, MD;  Location: [place];  Service: Cardiovascular;  Laterality: N/A;  . COLONOSCOPY WITH PROPOFOL N/A 03/24/2017   Procedure: COLONOSCOPY WITH PROPOFOL;  Surgeon: Wilford Corner, MD;  Location: [place];  Service: Endoscopy;  Laterality: N/A;  . ESOPHAGOGASTRODUODENOSCOPY (EGD) WITH PROPOFOL N/A 03/24/2017   Procedure: ESOPHAGOGASTRODUODENOSCOPY (EGD) WITH PROPOFOL;  Surgeon: Wilford Corner, MD;  Location: [place];  Service: Endoscopy;  Laterality: N/A;  . LIPOMA EXCISION Right   . REDUCTION MAMMAPLASTY Bilateral 1998  . RETINAL DETACHMENT SURGERY Left   . SALPINGOOPHORECTOMY    . TEE WITHOUT CARDIOVERSION N/A 10/28/2017   Procedure: TRANSESOPHAGEAL ECHOCARDIOGRAM (TEE);  Surgeon: Lelon Perla, MD;  Location: [facility] ENDOSCOPY;  Service: Cardiovascular;  Laterality: N/A;  . Julie Norton   at 86 years of age    reports that she has never smoked. She has never used smokeless tobacco. She reports that she does not drink alcohol and does not use drugs. family history includes Aortic aneurysm in her mother and sister; Cancer in her mother; Diabetes in her mother; Heart attack in her mother; Hypertension in her mother; Pneumonia in her father; Stroke in her mother. Allergies  Allergen Reactions  . Bactrim Nausea And Vomiting  . Dilaudid [Hydromorphone Hcl] Other (See Comments)    Unknown  . Hydrocodone Nausea And Vomiting  . Invokana [Canagliflozin] Itching and Other (See Comments)    Yeast Infections   . Macrodantin Nausea And Vomiting      Observations/Objective: Patient sounds cheerful and well on  the phone. I do not appreciate any SOB. Speech and thought processing are grossly intact. Patient reported vitals:  Assessment and Plan:  #1 recent fall with only mildly displaced left maxillary sinus fracture. Fortunately CT head revealed no bleed or other acute abnormality. -Recommended close observation follow-up immediately for any fever or signs of sinus infection or any persistent headaches -Multiple recent x-rays and labs from ER reviewed with patient  #2 stye/hordeolum right upper lid not resolving with several weeks of warm compresses  -Set up ophthalmology referral. She is concerned she may need blepharoplasty as well and would like to consult regarding both those  Follow Up Instructions:  -As above   [id number] 5-10 [id number] 11-20 [id number] 21-30 I did not refer this patient for an OV in the next 24 hours for this/these issue(s).  I discussed the assessment and treatment plan with the patient. The patient was provided an opportunity to ask questions and all were answered. The patient agreed with the plan and demonstrated an understanding of the instructions.   The patient was advised to call back or seek an in-person evaluation  if the symptoms worsen or if the condition fails to improve as anticipated.  I provided 30 minutes of non-face-to-face time during this encounter.   [name], MD
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
## 2020-02-20 NOTE — Telephone Encounter (Signed)
I spoke with patient, appointment offered and accepted for 4pm today.

## 2020-02-21 DIAGNOSIS — N958 Other specified menopausal and perimenopausal disorders: Secondary | ICD-10-CM | POA: Diagnosis not present

## 2020-02-21 DIAGNOSIS — M8588 Other specified disorders of bone density and structure, other site: Secondary | ICD-10-CM | POA: Diagnosis not present

## 2020-02-22 DIAGNOSIS — H524 Presbyopia: Secondary | ICD-10-CM | POA: Diagnosis not present

## 2020-02-22 DIAGNOSIS — H0011 Chalazion right upper eyelid: Secondary | ICD-10-CM | POA: Diagnosis not present

## 2020-02-26 ENCOUNTER — Ambulatory Visit
Admission: RE | Admit: 2020-02-26 | Discharge: 2020-02-26 | Disposition: A | Discharge status: Home / Self Care | Payer: Medicare Other | Source: Ambulatory Visit | Attending: Obstetrics & Gynecology | Admitting: Obstetrics & Gynecology

## 2020-02-26 ENCOUNTER — Other Ambulatory Visit: Payer: Self-pay

## 2020-02-26 DIAGNOSIS — Z1231 Encounter for screening mammogram for malignant neoplasm of breast: Secondary | ICD-10-CM | POA: Diagnosis not present

## 2020-02-28 ENCOUNTER — Other Ambulatory Visit: Payer: Self-pay | Admitting: Obstetrics & Gynecology

## 2020-02-28 DIAGNOSIS — R928 Other abnormal and inconclusive findings on diagnostic imaging of breast: Secondary | ICD-10-CM

## 2020-03-06 ENCOUNTER — Other Ambulatory Visit: Payer: Self-pay

## 2020-03-06 ENCOUNTER — Ambulatory Visit
Admission: RE | Admit: 2020-03-06 | Discharge: 2020-03-06 | Disposition: A | Discharge status: Home / Self Care | Payer: Medicare Other | Source: Ambulatory Visit | Attending: Obstetrics & Gynecology | Admitting: Obstetrics & Gynecology

## 2020-03-06 ENCOUNTER — Other Ambulatory Visit: Payer: Self-pay | Admitting: Obstetrics & Gynecology

## 2020-03-06 DIAGNOSIS — R921 Mammographic calcification found on diagnostic imaging of breast: Secondary | ICD-10-CM

## 2020-03-06 DIAGNOSIS — R928 Other abnormal and inconclusive findings on diagnostic imaging of breast: Secondary | ICD-10-CM | POA: Diagnosis not present

## 2020-03-14 ENCOUNTER — Ambulatory Visit
Admission: RE | Admit: 2020-03-14 | Discharge: 2020-03-14 | Disposition: A | Discharge status: Home / Self Care | Payer: Medicare Other | Source: Ambulatory Visit | Attending: Obstetrics & Gynecology | Admitting: Obstetrics & Gynecology

## 2020-03-14 ENCOUNTER — Other Ambulatory Visit: Payer: Self-pay

## 2020-03-14 ENCOUNTER — Other Ambulatory Visit: Payer: Self-pay | Admitting: Obstetrics & Gynecology

## 2020-03-14 DIAGNOSIS — R921 Mammographic calcification found on diagnostic imaging of breast: Secondary | ICD-10-CM

## 2020-03-14 DIAGNOSIS — D0512 Intraductal carcinoma in situ of left breast: Secondary | ICD-10-CM | POA: Diagnosis not present

## 2020-03-18 ENCOUNTER — Ambulatory Visit: Payer: Self-pay | Admitting: Surgery

## 2020-03-18 DIAGNOSIS — N6092 Unspecified benign mammary dysplasia of left breast: Secondary | ICD-10-CM

## 2020-03-18 NOTE — H&P (Signed)
Julie Norton Appointment: 03/18/2020 2:20 PM Location: [facility] Patient #: [id number] DOB: [birth year] Married / Language: Julie Norton / Race: White Female  History of Present Illness Julie Moores A. Julie Osika MD; 03/18/2020 3:42 PM) Patient words: Patient sent at the request of the [facility] [place] after mammographic abnormality noted left breast. She has history of left breast cancer status post left breast lumpectomy with radiation therapy 2012. Area density was noted left breast lower outer quadrant. There is an area both anterior-posterior core biopsies were done which showed atypical ductal hyperplasia with calcification. She has some bruising and soreness from the core biopsy site and is now on blood thinners for atrial fibrillation. She denies any history of breast pain or breast mass bilaterally.     Diagnosis 1. Breast, left, needle core biopsy, lower outer anterior - FOCAL ATYPICAL DUCTAL HYPERPLASIA WITH CALCIFICATIONS. - SEE MICROSCOPIC DESCRIPTION 2. Breast, left, needle core biopsy, lower outer posterior - FOCAL ATYPICAL DUCTAL HYPERPLASIA WITH CALCIFICATIONS. - SEE MICROSCOPIC DESCRIPTION. Microscopic Comment 1. -2. There are microscopic foci in both biopsies showing an atypical ductal proliferation consistent with atypical ductal hyperplasia. This focus is microscopic and focally borders on ductal carcinoma in situ. Dr. Melina Copa agrees. Called to the [place] on 03/15/2020. (JDP:kh 03/15/20). Claudette Laws MD Pathologist, Electronic Signature (Case signed 03/15/2020) Specimen Gross and Clinical Information Specimen Comment 1. TIF: 12:20, CIT: < 5 minutes, calcifications 2. TIF: 12:50, CIT: < 5 minutes, calcifications Specimen(s) Obtained: 1. Breast, left, needle core biopsy, lower outer anterior 2. Breast, left, needle core biopsy, lower outer posterior Specimen Clinical Information 1. R/O DCIS 2. R/O DCIS 1 of.  The patient is a  86 year old female.   Past Surgical History [facility] Julie Norton, Norton; 03/18/2020 2:20 PM) Appendectomy Breast Biopsy Left. Breast Mass; Local Excision Left. Cataract Surgery Left. Colon Polyp Removal - Colonoscopy Hysterectomy (not due to cancer) - Complete Mammoplasty; Reduction Left. Oral Surgery Tonsillectomy  Diagnostic Studies History (Julie Norton, Norton; 03/18/2020 2:20 PM) Colonoscopy 1-5 years ago Mammogram within last year Pap Smear 1-5 years ago  Allergies (Julie Norton, Norton; 03/18/2020 2:22 PM) Bactrim *ANTI-INFECTIVE AGENTS - MISC.* Dilaudid *ANALGESICS - OPIOID* HYDROcodone Bitartrate ER *ANALGESICS - OPIOID* Invokana *ANTIDIABETICS* Macrodantin *ANTI-INFECTIVE AGENTS - MISC.* Allergies Reconciled  Medication History (Julie Norton, Norton; 03/18/2020 2:23 PM) Amiodarone HCl (200MG  Tablet, Oral) Active. Eliquis (5MG  Tablet, Oral) Active. Atorvastatin Calcium (10MG  Tablet, Oral) Active. Calcium (Oral) Specific strength unknown - Active. Furosemide (40MG  Tablet, Oral) Active. Levothyroxine Sodium (50MCG Tablet, Oral) Active. Claritin (10MG  Tablet, Oral) Active. metFORMIN HCl ER (500MG  Tablet ER 24HR, Oral) Active. Metoprolol Succinate ER (25MG  Tablet ER 24HR, Oral) Active. Omeprazole (20MG  Capsule DR, Oral) Active. traZODone HCl (50MG  Tablet, Oral) Active. Medications Reconciled  Pregnancy / Birth History Julie Norton, Julie Norton; 03/18/2020 2:20 PM) Age at menarche 18 years. Age of menopause 33-55 Gravida 2 Maternal age 48-25 Para 2  Other Problems (Julie Norton, Julie Norton; 03/18/2020 2:20 PM) Atrial Fibrillation Back Pain Breast Cancer Diabetes Mellitus Diverticulosis Gastroesophageal Reflux Disease General anesthesia - complications Lump In Breast Oophorectomy Left. Thyroid Disease     Review of Systems (Julie Norton; 03/18/2020 2:20 PM) General Present- Fatigue. Not Present- Appetite Loss, Chills, Fever, Night Sweats,  Weight Gain and Weight Loss. Skin Not Present- Change in Wart/Mole, Dryness, Hives, Jaundice, New Lesions, Non-Healing Wounds, Rash and Ulcer. HEENT Present- Ringing in the Ears, Seasonal Allergies and Wears glasses/contact lenses. Not Present- Earache, Hearing Loss, Hoarseness, Nose Bleed, Oral Ulcers, Sinus Pain, Sore Throat,  Visual Disturbances and Yellow Eyes. Breast Present- Breast Mass. Not Present- Breast Pain, Nipple Discharge and Skin Changes. Cardiovascular Present- Swelling of Extremities. Not Present- Chest Pain, Difficulty Breathing Lying Down, Leg Cramps, Palpitations, Rapid Heart Rate and Shortness of Breath. Gastrointestinal Not Present- Abdominal Pain, Bloating, Bloody Stool, Change in Bowel Habits, Chronic diarrhea, Constipation, Difficulty Swallowing, Excessive gas, Gets full quickly at meals, Hemorrhoids, Indigestion, Nausea, Rectal Pain and Vomiting. Female Genitourinary Present- Frequency. Not Present- Nocturia, Painful Urination, Pelvic Pain and Urgency. Musculoskeletal Present- Back Pain and Muscle Weakness. Not Present- Joint Pain, Joint Stiffness, Muscle Pain and Swelling of Extremities. Neurological Present- Trouble walking and Weakness. Not Present- Decreased Memory, Fainting, Headaches, Numbness, Seizures, Tingling and Tremor. Psychiatric Not Present- Anxiety, Bipolar, Change in Sleep Pattern, Depression, Fearful and Frequent crying. Endocrine Present- Cold Intolerance and Hair Changes. Not Present- Excessive Hunger, Heat Intolerance, Hot flashes and New Diabetes. Hematology Present- Blood Thinners and Easy Bruising. Not Present- Excessive bleeding, Gland problems, HIV and Persistent Infections.  Vitals (Julie Norton; 03/18/2020 2:23 PM) 03/18/2020 2:23 PM Weight: 146.25 lb Height: 61in Body Surface Area: 1.65 m Body Mass Index: 27.63 kg/m  Temp.: 96.19F  Pulse: 60 (Regular)  BP: 122/74(Sitting, Left Arm, Standard)        Physical Exam (Julie Dalia A.  Julie Jansma MD; 03/18/2020 3:45 PM)  General Mental Status-Alert. General Appearance-Consistent with stated age. Hydration-Well hydrated. Voice-Normal.  Chest and Lung Exam Chest and lung exam reveals -quiet, even and easy respiratory effort with no use of accessory muscles and on auscultation, normal breath sounds, no adventitious sounds and normal vocal resonance. Inspection Chest Wall - Normal. Back - normal.  Breast Note: Left breast bruising noted. Scar from previous surgery post radiation changes. No masses. Right breast is normal on exam today.  Cardiovascular Cardiovascular examination reveals -normal pedal pulses bilaterally. Note:A FIB RATE CONTROLLED  Neurologic Neurologic evaluation reveals -alert and oriented x 3 with no impairment of recent or remote memory. Mental Status-Normal.  Lymphatic Head & Neck  General Head & Neck Lymphatics: Bilateral - Description - Normal. Axillary  General Axillary Region: Bilateral - Description - Normal. Tenderness - Non Tender.    Assessment & Plan (Keymoni Mccaster A. Maison Agrusa MD; 03/18/2020 3:45 PM)  ATYPICAL DUCTAL HYPERPLASIA OF LEFT BREAST (N60.92) Impression: Recommend left breast seed lumpectomy for both spots. Risk of lumpectomy include bleeding, infection, seroma, more surgery, use of seed/wire, wound care, cosmetic deformity and the need for other treatments, death , blood clots, death. Pt agrees to proceed. NEEDS CARDIAC CLEARANCE FOR ELIQUIS / A FIB  TOTAL TIME 78 MINUTES  Current Plans Pt Education - [facility] Free Text Education/Instructions: discussed with patient and provided information. You are being scheduled for surgery- Our schedulers will call you.  You should hear from our office's scheduling department within 5 working days about the location, date, and time of surgery. We try to make accommodations for patient's preferences in scheduling surgery, but sometimes the OR schedule or the surgeon's schedule  prevents [place] from making those accommodations.  If you have not heard from our office [phone]) in 5 working days, call the office and ask for your surgeon's nurse.  If you have other questions about your diagnosis, plan, or surgery, call the office and ask for your surgeon's nurse.  Pt Education - [facility] Breast Biopsy HCI: discussed with patient and provided information.

## 2020-03-19 ENCOUNTER — Telehealth: Payer: Self-pay | Admitting: *Deleted

## 2020-03-19 NOTE — Telephone Encounter (Signed)
Clinical pharmacist to review Eliquis 

## 2020-03-19 NOTE — Telephone Encounter (Signed)
° °  [facility] Pre-operative Risk Assessment    HEARTCARE STAFF: - Please ensure there is not already an duplicate clearance open for this procedure. - Under Visit Info/Reason for Call, type in Other and utilize the format Clearance MM/DD/YY or Clearance TBD. Do not use dashes or single digits. - If request is for dental extraction, please clarify the # of teeth to be extracted.  Request for surgical clearance:  1. What type of surgery is being performed? Left Breast Lumpectomy with Radioactive Seed Localization   2. When is this surgery scheduled? TBD   3. What type of clearance is required (medical clearance vs. Pharmacy clearance to hold med vs. Both)? medical  4. Are there any medications that need to be held prior to surgery and how long? Eiquis   5. Practice name and name of physician performing surgery? [facility]   6. What is the office phone number? [phone]    7.   What is the office fax number? [phone]  8.   Anesthesia type (None, local, MAC, general) ? Samule Ohm, Jeralene Huff 03/19/2020, 5:04 PM  _________________________________________________________________   (provider comments below)

## 2020-03-20 NOTE — Telephone Encounter (Signed)
   Primary Cardiologist: Peter Martinique, MD  Chart reviewed as part of pre-operative protocol coverage. Patient was contacted 03/20/2020 in reference to pre-operative risk assessment for pending surgery as outlined below.  Julie Norton was last seen on 12/20/19 by Dr. Martinique.  Since that day, Julie Norton has done fine from a cardiac standpoint. She has occasional DOE which is unchanged in recent months. No complaints of chest pain. She continues to have fatigue which she noted at her last visit with Dr. Martinique in June. She can complete 4 METs without anginal complaints.  Therefore, based on ACC/AHA guidelines, the patient would be at acceptable risk for the planned procedure without further cardiovascular testing.   The patient was advised that if she develops new symptoms prior to surgery to contact our office to arrange for a follow-up visit, and she verbalized understanding.  Per pharmacy recommendations, patient can hold eliquis 2 days prior to her upcoming lumpectomy with plans to restart as soon as she is cleared to do so by her surgeon.  I will route this recommendation to the requesting party via Epic fax function and remove from pre-op pool. Please call with questions.  Abigail Butts, PA-C 03/20/2020, 4:23 PM

## 2020-03-20 NOTE — Telephone Encounter (Signed)
Pt is returning call to Preop, please return call  Best cb number -[phone] Cell [phone]

## 2020-03-20 NOTE — Telephone Encounter (Signed)
   Primary Cardiologist: Peter Martinique, MD  Chart reviewed as part of pre-operative protocol coverage.   Per pharmacy recommendations, patient can hold eliquis 2 days prior to her upcoming lumpectomy with plans to restart as soon as she is cleared to do so by her surgeon.  Left a voicemail for ongoing preoperative assessment.   Abigail Butts, PA-C 03/20/2020, 1:17 PM

## 2020-03-20 NOTE — Telephone Encounter (Signed)
Patient with diagnosis of afib on Eliquis for anticoagulation.    Procedure: Left Breast Lumpectomy with Radioactive Seed Localization  Date of procedure: TBD  CHADS2-VASc score of 5 (age x2, sex, CHF, DM)  CrCl 10mL/min Platelet count 158K  Per office protocol, patient can hold Eliquis for 2 days prior to procedure.

## 2020-03-25 ENCOUNTER — Telehealth: Payer: Self-pay | Admitting: Cardiology

## 2020-03-25 NOTE — Telephone Encounter (Signed)
   Primary Cardiologist: Peter Martinique, MD  Chart reviewed as part of pre-operative protocol coverage. Will route to callback team to reach out to dental office to determine how many extractions are involved and very this is in fact under general anesthesia. Preliminarily upon review do not see any SBE ppx needs. Of note pt also has recent clearance for a different procedure from just 5 days ago. Await further info on extent of dental procedure.  Charlie Pitter, PA-C 03/25/2020, 4:24 PM

## 2020-03-25 NOTE — Telephone Encounter (Signed)
Will route to pharm for input then likely reference recent clearance otherwise from 9/14 phone note. Duvan Mousel PA-C

## 2020-03-25 NOTE — Telephone Encounter (Signed)
[place]      [facility] Pre-operative Risk Assessment    HEARTCARE STAFF: - Please ensure there is not already an duplicate clearance open for this procedure. - Under Visit Info/Reason for Call, type in Other and utilize the format Clearance MM/DD/YY or Clearance TBD. Do not use dashes or single digits. - If request is for dental extraction, please clarify the # of teeth to be extracted.  Request for surgical clearance:  1. What type of surgery is being performed? Teeth extraction     2. When is this surgery scheduled? Not yet sch'd   What type of clearance is required (medical clearance vs. Pharmacy clearance to hold med vs. Both)?  Pharmacy   Are there any medications that need to be held prior to surgery and how long?  apixaban (ELIQUIS) 5 MG TABS tablet [[id number]] / office needs to know when pt needs to stop this med or how long prior to appt   3. Practice name and name of physician performing surgery? [facility]    4. What is the office phone number? [phone]   7.   What is the office fax number? [phone]  8.   Anesthesia type (None, local, MAC, general) ? General    Shana A Stovall 03/25/2020, 3:52 PM  _________________________________________________________________   (provider comments below)

## 2020-03-25 NOTE — Telephone Encounter (Signed)
Spoke with dental office and they are planning to extract 4 teeth using local anesthesia.

## 2020-03-26 ENCOUNTER — Other Ambulatory Visit: Payer: Self-pay | Admitting: Surgery

## 2020-03-26 DIAGNOSIS — N6092 Unspecified benign mammary dysplasia of left breast: Secondary | ICD-10-CM

## 2020-03-26 NOTE — Telephone Encounter (Signed)
   Primary Cardiologist: Peter Martinique, MD  Chart reviewed as part of pre-operative protocol coverage. Given past medical history and time since last visit, based on ACC/AHA guidelines, Julie Norton would be at acceptable risk for the planned procedure without further cardiovascular testing.   OK to hold Eliquis 2 days pre op and resume as soon as possible post op.  The patient will not need SBE prophylaxis.  I spoke with her this morning and informed her of these instructions.   The patient was advised that if she develops new symptoms prior to surgery to contact our office to arrange for a follow-up visit, and she verbalized understanding.  I will route this recommendation to the requesting party via Epic fax function and remove from pre-op pool.  Please call with questions.  Kerin Ransom, PA-C 03/26/2020, 8:52 AM

## 2020-03-26 NOTE — Telephone Encounter (Signed)
Patient with diagnosis of ATRIAL BIFRILLATION on ELIQUIS for anticoagulation.    Procedure: TEETH EXTRACTION Date of procedure: TBD  CHADS2-VASc score of  5 (CHF,DM2, AGE x 2, female)  CrCl 57 mL/mini Platelet count 158  *Per office protocol we recommend against holding Eliquis for single tooth extraction*    Okay to HOLD Eliquis for 48 hoursa if multiple extractions needed at one time.

## 2020-04-16 ENCOUNTER — Other Ambulatory Visit: Payer: Self-pay | Admitting: Adult Health

## 2020-04-22 ENCOUNTER — Other Ambulatory Visit: Payer: Self-pay

## 2020-04-22 ENCOUNTER — Ambulatory Visit (INDEPENDENT_AMBULATORY_CARE_PROVIDER_SITE_OTHER): Payer: Medicare Other | Admitting: Family Medicine

## 2020-04-22 VITALS — BP 108/60 | HR 61 | Temp 97.9°F | Ht 61.5 in | Wt 150.7 lb

## 2020-04-22 DIAGNOSIS — R600 Localized edema: Secondary | ICD-10-CM | POA: Diagnosis not present

## 2020-04-22 DIAGNOSIS — E039 Hypothyroidism, unspecified: Secondary | ICD-10-CM | POA: Diagnosis not present

## 2020-04-22 DIAGNOSIS — L659 Nonscarring hair loss, unspecified: Secondary | ICD-10-CM | POA: Diagnosis not present

## 2020-04-22 DIAGNOSIS — E1165 Type 2 diabetes mellitus with hyperglycemia: Secondary | ICD-10-CM

## 2020-04-22 NOTE — Progress Notes (Signed)
Established Patient Office Visit  Subjective:  Patient ID: Julie Norton, female    DOB: [birth year]  Age: 86 y.o. MRN: [id number]  CC:  Chief Complaint  Patient presents with  . Leg Swelling    started having  in feet x 2 week hjx of afid, having weakness, issues with gait, notice hair loss , having sob , trouble with sleeping     HPI Julie Norton presents for multiple complaints including fatigue, diffuse alopecia, increased swelling of both legs and recent weight gain of 6 pounds in 1 week.  Her past medical history is significant for history of atrial fibrillation, hypothyroidism, type 2 diabetes, osteoarthritis, ductal carcinoma in situ left breast.  She is scheduled to have lumpectomy in November.  She relates 3 to 4-week of fairly diffuse hair loss.  She does have hypothyroidism and is on replacement.  She had normal TSH and free T4 back in June.  She is on amiodarone and also takes Eliquis.  She has had some edema in the past but relates especially worsening over the past week.  6 pound weight gain.  She is eating a fair amount of processed foods especially with canned soups.  She has been unable to prepare a lot of meals and husband also is very debilitated.  She is currently on furosemide 20 mg daily and several weeks ago bumped this up to 40 mg for 3 days with minimal improvement.  Last echocardiogram showed ejection fraction of 50 to 55%.  Swelling seems to get slightly better at night.  No orthopnea.  Blood sugars have been relatively stable recently.  Currently taking 16 units of Tresiba once daily and also on Metformin.  Last A1c was 8.3%.  Past Medical History:  Diagnosis Date  . Abdominal adhesions   . Abdominal gas pain    suspected -- may be related to intermittent right upper quadrant discomfort radiating around her back      . Breast cancer ([place])   . Breast mass    left breast  . Cancer ([place])    left breast  . Chest pain    intermittent right upper  quadrant discomfort radiating around her back      . Complication of anesthesia   . Diabetes mellitus without complication ([place])    Type II  . Dyspepsia   . GERD (gastroesophageal reflux disease)   . H/O: hysterectomy 1978  . Hypercholesteremia   . Hypothyroidism   . Personal history of radiation therapy   . Pneumonia   . PONV (postoperative nausea and vomiting)   . Tendinitis    of the right arm and shoulder  . Torn tendon    right shoulder    Past Surgical History:  Procedure Laterality Date  . ABDOMINAL HYSTERECTOMY    . APPENDECTOMY  1978  . BLADDER REPAIR    . BREAST LUMPECTOMY Left 02/24/2011   central partial mastectomy - dr Margot Chimes  . BREAST REDUCTION SURGERY    . CARDIOVERSION N/A 10/28/2017   Procedure: CARDIOVERSION;  Surgeon: Lelon Perla, MD;  Location: [facility] ENDOSCOPY;  Service: Cardiovascular;  Laterality: N/A;  . CARDIOVERSION N/A 04/06/2019   Procedure: CARDIOVERSION;  Surgeon: [name], MD;  Location: [facility] ENDOSCOPY;  Service: Cardiovascular;  Laterality: N/A;  . CARDIOVERSION N/A 09/08/2019   Procedure: CARDIOVERSION;  Surgeon: Dorothy Spark, MD;  Location: [place];  Service: Cardiovascular;  Laterality: N/A;  . COLONOSCOPY WITH PROPOFOL N/A 03/24/2017   Procedure: COLONOSCOPY WITH PROPOFOL;  Surgeon:  Wilford Corner, MD;  Location: [place];  Service: Endoscopy;  Laterality: N/A;  . ESOPHAGOGASTRODUODENOSCOPY (EGD) WITH PROPOFOL N/A 03/24/2017   Procedure: ESOPHAGOGASTRODUODENOSCOPY (EGD) WITH PROPOFOL;  Surgeon: Wilford Corner, MD;  Location: [place];  Service: Endoscopy;  Laterality: N/A;  . LIPOMA EXCISION Right   . REDUCTION MAMMAPLASTY Bilateral 1998  . RETINAL DETACHMENT SURGERY Left   . SALPINGOOPHORECTOMY    . TEE WITHOUT CARDIOVERSION N/A 10/28/2017   Procedure: TRANSESOPHAGEAL ECHOCARDIOGRAM (TEE);  Surgeon: Lelon Perla, MD;  Location: [facility] ENDOSCOPY;  Service: Cardiovascular;  Laterality: N/A;  . [place]   at 86 years of age    Family History  Problem Relation Age of Onset  . Pneumonia Father   . Heart attack Mother   . Hypertension Mother   . Stroke Mother   . Diabetes Mother   . Aortic aneurysm Mother        abdominal aortic aneurysm  . Cancer Mother        bladder  . Aortic aneurysm Sister        abdominal aortic aneurysm    Social History   Socioeconomic History  . Marital status: Married    Spouse name: Not on file  . Number of children: Not on file  . Years of education: Not on file  . Highest education level: Not on file  Occupational History  . Not on file  Tobacco Use  . Smoking status: Never Smoker  . Smokeless tobacco: Never Used  Vaping Use  . Vaping Use: Never used  Substance and Sexual Activity  . Alcohol use: No  . Drug use: No  . Sexual activity: Not on file  Other Topics Concern  . Not on file  Social History Narrative  . Not on file   Social Determinants of Health   Financial Resource Strain: Medium Risk  . Difficulty of Paying Living Expenses: Somewhat hard  Food Insecurity:   . Worried About Charity fundraiser in the Last Year: Not on file  . Ran Out of Food in the Last Year: Not on file  Transportation Needs: No Transportation Needs  . Lack of Transportation (Medical): No  . Lack of Transportation (Non-Medical): No  Physical Activity:   . Days of Exercise per Week: Not on file  . Minutes of Exercise per Session: Not on file  Stress:   . Feeling of Stress : Not on file  Social Connections:   . Frequency of Communication with Friends and Family: Not on file  . Frequency of Social Gatherings with Friends and Family: Not on file  . Attends Religious Services: Not on file  . Active Member of Clubs or Organizations: Not on file  . Attends Archivist Meetings: Not on file  . Marital Status: Not on file  Intimate Partner Violence:   . Fear of Current or Ex-Partner: Not on file  . Emotionally Abused: Not  on file  . Physically Abused: Not on file  . Sexually Abused: Not on file    Outpatient Medications Prior to Visit  Medication Sig Dispense Refill  . acetaminophen (TYLENOL) 500 MG tablet Take 1,000 mg by mouth every 6 (six) hours as needed for mild pain or moderate pain.     Marland Kitchen amiodarone (PACERONE) 200 MG tablet Take 1 tablet (200 mg total) by mouth daily. 90 tablet 3  . atorvastatin (LIPITOR) 10 MG tablet TAKE ONE-HALF TABLET BY  MOUTH EVERY OTHER DAY OR AS DIRECTED 21 tablet  3  . CALCIUM PO Take 1 tablet by mouth daily.    . [employer] D3 125 MCG (5000 UT) capsule TAKE 1 CAPSULE BY MOUTH EVERY DAY 30 capsule 3  . ELIQUIS 5 MG TABS tablet TAKE 1 TABLET BY MOUTH TWICE A DAY 180 tablet 1  . furosemide (LASIX) 40 MG tablet Take 1 tablet (40 mg total) by mouth daily. 90 tablet 3  . glucose blood (ONE TOUCH ULTRA TEST) test strip Check once daily. E11.40 100 each 11  . Insulin Pen Needle 29G X 5MM MISC Use once daily 100 each 3  . levothyroxine (SYNTHROID) 50 MCG tablet TAKE 1 TABLET BY MOUTH  DAILY 90 tablet 2  . loratadine (CLARITIN) 10 MG tablet Take 10 mg by mouth daily as needed for allergies.    . metFORMIN (GLUCOPHAGE-XR) 500 MG 24 hr tablet TAKE 1 TABLET BY MOUTH  TWICE DAILY 180 tablet 1  . metoprolol succinate (TOPROL XL) 25 MG 24 hr tablet Take 1 tablet (25 mg total) by mouth daily. 90 tablet 3  . omeprazole (PRILOSEC) 20 MG capsule TAKE 1 CAPSULE BY MOUTH  TWICE DAILY 180 capsule 2  . ONE TOUCH ULTRA TEST test strip CHECK ONCE A DAY 50 each 3  . traZODone (DESYREL) 50 MG tablet TAKE 1/2 TO 1 TABLET BY MOUTH AT BEDTIME AS NEEDED FOR SLEEP 90 tablet 1  . TRESIBA FLEXTOUCH 100 UNIT/ML FlexTouch Pen INJECT 24 UNITS TOTAL INTO THE SKIN DAILY AT 10PM (Patient taking differently: Inject 18 Units into the skin at bedtime. ) 15 pen 5  . triamcinolone cream (KENALOG) 0.1 % Apply 1 application topically 2 (two) times daily. 30 g 2  . vitamin B-12 (CYANOCOBALAMIN) 1000 MCG tablet Take 1,000 mcg by  mouth daily.     No facility-administered medications prior to visit.    Allergies  Allergen Reactions  . Bactrim Nausea And Vomiting  . Dilaudid [Hydromorphone Hcl] Other (See Comments)    Unknown  . Hydrocodone Nausea And Vomiting  . Invokana [Canagliflozin] Itching and Other (See Comments)    Yeast Infections   . Macrodantin Nausea And Vomiting    ROS Review of Systems  Constitutional: Positive for fatigue and unexpected weight change. Negative for chills and fever.  Respiratory: Positive for shortness of breath. Negative for cough and wheezing.   Cardiovascular: Positive for leg swelling. Negative for chest pain and palpitations.  Gastrointestinal: Negative for abdominal pain.  Genitourinary: Negative for dysuria.  Neurological: Negative for dizziness.  Psychiatric/Behavioral: Negative for confusion.      Objective:    Physical Exam Vitals reviewed.  Constitutional:      Appearance: Normal appearance.  Cardiovascular:     Rate and Rhythm: Normal rate and regular rhythm.  Pulmonary:     Effort: Pulmonary effort is normal.     Breath sounds: Normal breath sounds.  Musculoskeletal:     Right lower leg: Edema present.     Left lower leg: Edema present.     Comments: She has some purplish discoloration of the toes and good capillary refill.  No ulcerations.  She has 2+ edema legs feet and ankles bilaterally  Neurological:     Mental Status: She is alert.     BP 108/60 (BP Location: Left Arm, Patient Position: Sitting, Cuff Size: Normal)   Pulse 61   Temp 97.9 F (36.6 C) (Temporal)   Ht 5' 1.5" (1.562 m)   Wt 150 lb 11.2 oz (68.4 kg)   SpO2 96%   BMI  28.01 kg/m  Wt Readings from Last 3 Encounters:  04/22/20 150 lb 11.2 oz (68.4 kg)  02/14/20 145 lb (65.8 kg)  12/15/19 141 lb 3.2 oz (64 kg)     Health Maintenance Due  Topic Date Due  . URINE MICROALBUMIN  04/07/2014  . FOOT EXAM  08/29/2015  . PNA vac Low Risk Adult (2 of 2 - PPSV23) 03/03/2017  .  OPHTHALMOLOGY EXAM  07/30/2019  . INFLUENZA VACCINE  02/04/2020    There are no preventive care reminders to display for this patient.  Lab Results  Component Value Date   TSH 2.690 12/11/2019   Lab Results  Component Value Date   WBC 4.7 02/19/2020   HGB 9.4 (L) 02/19/2020   HCT 31.5 (L) 02/19/2020   MCV 80.8 02/19/2020   PLT 158 02/19/2020   Lab Results  Component Value Date   NA 140 02/19/2020   K 4.4 02/19/2020   CHLORIDE 111 (H) 04/03/2014   CO2 26 02/19/2020   GLUCOSE 176 (H) 02/19/2020   BUN 15 02/19/2020   CREATININE 0.72 02/19/2020   BILITOT 0.4 12/11/2019   ALKPHOS 87 12/11/2019   AST 39 12/11/2019   ALT 32 12/11/2019   PROT 6.4 12/11/2019   ALBUMIN 4.1 12/11/2019   CALCIUM 9.6 02/19/2020   ANIONGAP 8 02/19/2020   GFR 70.88 06/23/2019   Lab Results  Component Value Date   CHOL 94 06/23/2019   Lab Results  Component Value Date   HDL 35.20 (L) 06/23/2019   Lab Results  Component Value Date   LDLCALC 45 06/23/2019   Lab Results  Component Value Date   TRIG 70.0 06/23/2019   Lab Results  Component Value Date   CHOLHDL 3 06/23/2019   Lab Results  Component Value Date   HGBA1C 8.3 (A) 10/27/2019      Assessment & Plan:   Problem List Items Addressed This Visit      Unprioritized   Poorly controlled type 2 diabetes mellitus ([place])   Relevant Orders   Hemoglobin A1c   Hypothyroidism - Primary   Relevant Orders   TSH    Other Visit Diagnoses    Alopecia       Bilateral leg edema       Relevant Orders   Basic metabolic panel   Brain Natriuretic Peptide     -She has bilateral leg edema which has worsened recently.  Probably related to multiple factors including decreased activity, possibly increased sodium intake, venous stasis  -Continue daily weights -Watch sodium intake -Increase furosemide to 40 mg daily for 5 days then bump back to 20 mg.  We have instructed her to be in touch if she is not seeing weight reduction with the  above dosing -Recheck labs including TSH, basic metabolic panel, BNP, hemoglobin A1c -Encouraged to elevate legs as much as possible  No orders of the defined types were placed in this encounter.   Follow-up: No follow-ups on file.    [name], MD

## 2020-04-22 NOTE — Patient Instructions (Signed)
Watch sodium intake  Continue to weigh daily  Increase Furosemide to 40 mg daily for next five days and then drop back to 20 mg  Let me know if edema and weight not improving with increased Lasix.

## 2020-04-23 LAB — BASIC METABOLIC PANEL
BUN: 14 mg/dL (ref 7–25)
CO2: 24 mmol/L (ref 20–32)
Calcium: 9.8 mg/dL (ref 8.6–10.4)
Chloride: 103 mmol/L (ref 98–110)
Creat: 0.81 mg/dL (ref 0.60–0.88)
Glucose, Bld: 210 mg/dL — ABNORMAL HIGH (ref 65–99)
Potassium: 4 mmol/L (ref 3.5–5.3)
Sodium: 139 mmol/L (ref 135–146)

## 2020-04-23 LAB — TSH: TSH: 2.08 mIU/L (ref 0.40–4.50)

## 2020-04-23 LAB — HEMOGLOBIN A1C
Hgb A1c MFr Bld: 9.1 % of total Hgb — ABNORMAL HIGH (ref <5.7)
Mean Plasma Glucose: 214 (calc)
eAG (mmol/L): 11.9 (calc)

## 2020-04-23 LAB — BRAIN NATRIURETIC PEPTIDE: Brain Natriuretic Peptide: 277 pg/mL — ABNORMAL HIGH (ref <100)

## 2020-05-03 DIAGNOSIS — Z794 Long term (current) use of insulin: Secondary | ICD-10-CM | POA: Diagnosis not present

## 2020-05-03 DIAGNOSIS — E119 Type 2 diabetes mellitus without complications: Secondary | ICD-10-CM | POA: Diagnosis not present

## 2020-05-06 ENCOUNTER — Telehealth: Payer: Self-pay | Admitting: *Deleted

## 2020-05-06 NOTE — Telephone Encounter (Signed)
Patient called wanting to let the nurse know she was seen 10/18 and Dr Elease Hashimoto told her to call back if her symptoms wasn't better. Patient states her legs and feet are swollen pretty bad. Patient states her weight is about the same but the legs are really bad. Patient states she is not sleeping. Patient wants to know if she can take something to help her sleep. Patient states her blood sugar is crazy and is really low in the mornings, patient says it will be in the 70s in the mornings and throughout the day it will be in the 200s. Patient states she cannot function when it's low in the mornings. Please advise [phone] or [phone]

## 2020-05-06 NOTE — Telephone Encounter (Signed)
1)  try increasing the Lasix to 40 mg po bid for 3 days, and then drop back to usual dose of 20 mg daily  2)  don't know if she has tried OTC Melatonin for insomnia.  If she has not, try 3 to 5 mg qhs  3)  recent A1C was 9.1% so reluctant to cut back her insulin.  Make sure she is eating 3 steady meals per day and avoiding high glycemic foods.

## 2020-05-07 NOTE — Telephone Encounter (Signed)
I spoke with patient. We went over the information below and she verbalized understanding. She will let us know if her legs don't improve after increasing the Lasix. Patient recently had teeth extracted, so she hasn't been able to eat full meals like she did before. She is working on this and will let us know about her sugars.

## 2020-05-10 ENCOUNTER — Other Ambulatory Visit: Payer: Self-pay

## 2020-05-10 ENCOUNTER — Encounter (HOSPITAL_BASED_OUTPATIENT_CLINIC_OR_DEPARTMENT_OTHER): Payer: Self-pay | Admitting: Surgery

## 2020-05-10 NOTE — Progress Notes (Signed)
Patient chart reviewed by Dr. Tobias Alexander, Anesthesiologist. Notified of recent increase in fluid retention (leg swelling) and fatigue. Labs reviewed as well. Okay to proceed with surgery as planned per Dr. Tobias Alexander.

## 2020-05-12 ENCOUNTER — Other Ambulatory Visit: Payer: Self-pay | Admitting: Family Medicine

## 2020-05-13 ENCOUNTER — Other Ambulatory Visit (HOSPITAL_COMMUNITY): Payer: Medicare Other

## 2020-05-14 ENCOUNTER — Ambulatory Visit (INDEPENDENT_AMBULATORY_CARE_PROVIDER_SITE_OTHER): Payer: Medicare Other | Admitting: Family Medicine

## 2020-05-14 ENCOUNTER — Encounter: Payer: Self-pay | Admitting: Family Medicine

## 2020-05-14 ENCOUNTER — Other Ambulatory Visit: Payer: Self-pay

## 2020-05-14 VITALS — BP 124/58 | HR 54 | Temp 98.3°F | Ht 61.5 in | Wt 150.4 lb

## 2020-05-14 DIAGNOSIS — E1165 Type 2 diabetes mellitus with hyperglycemia: Secondary | ICD-10-CM

## 2020-05-14 DIAGNOSIS — R600 Localized edema: Secondary | ICD-10-CM

## 2020-05-14 MED ORDER — POTASSIUM CHLORIDE ER 10 MEQ PO TBCR: 10.0000 meq | EXTENDED_RELEASE_TABLET | Freq: Every day | ORAL | 1 refills | Status: DC

## 2020-05-14 MED ORDER — TORSEMIDE 10 MG PO TABS: 10.0000 mg | ORAL_TABLET | Freq: Every day | ORAL | 1 refills | Status: DC

## 2020-05-14 NOTE — Progress Notes (Signed)
Established Patient Office Visit  Subjective:  Patient ID: Julie Norton, female    DOB: [birth year]  Age: 86 y.o. MRN: [id number]  CC:  Chief Complaint  Patient presents with  . Leg Swelling    swelling leg and feet , discuss surgery , tried fluid medication, no helping, having issues w/ gait     HPI Julie Norton presents for chief complaint of bilateral leg swelling.  She has been dealing with this for really several weeks now.  We recently bumped her furosemide up to 40 mg from her usual 20 mg of this did not seem to make much difference.  Her weight is still 150 pounds.  She is normally 143 pounds at baseline.  She has had some bilateral leg discomfort which she attributes to the edema.  No ulcerations.  She was initially scheduled for lumpectomy for breast cancer but this has been delayed because of her peripheral edema issues.  Last visit we obtained several labs.  Renal function stable by recent labs.  TSH normal.  BNP slightly elevated  277.  A1c 9.1%.  Last echo 2019 with normal EF.    No orthopnea.  Most recent albumin normal.  Elevates legs without much improvement.  Edema slightly improved after elevation at night.  Type 2 diabetes on Tresiba.  Using 18 units at bedtime.  Fasting blood sugars usually around 60-90 but frequently up over 200 later in the day.  Also takes Metformin.  Previous intolerance with Invokana.  Past Medical History:  Diagnosis Date  . Abdominal adhesions   . Abdominal gas pain    suspected -- may be related to intermittent right upper quadrant discomfort radiating around her back      . Anemia   . Anginal pain ([place])   . Arthritis   . Atrial fibrillation ([place])   . Breast cancer ([place])   . Breast mass    left breast  . Cancer ([place])    left breast  . Chest pain    intermittent right upper quadrant discomfort radiating around her back      . Complication of anesthesia   . Diabetes mellitus without complication ([place])    Type II  .  Dyspepsia   . Dyspnea   . GERD (gastroesophageal reflux disease)   . H/O: hysterectomy 1978  . History of cardioversion   . Hypercholesteremia   . Hypothyroidism   . Mitral insufficiency   . Personal history of radiation therapy   . Pneumonia   . PONV (postoperative nausea and vomiting)   . Right bundle branch block (RBBB)   . Tendinitis    of the right arm and shoulder  . Torn tendon    right shoulder    Past Surgical History:  Procedure Laterality Date  . ABDOMINAL HYSTERECTOMY    . APPENDECTOMY  1978  . BLADDER REPAIR    . BREAST LUMPECTOMY Left 02/24/2011   central partial mastectomy - dr Margot Chimes  . BREAST REDUCTION SURGERY    . CARDIOVERSION N/A 10/28/2017   Procedure: CARDIOVERSION;  Surgeon: Lelon Perla, MD;  Location[facility] ENDOSCOPY;  Service: Cardiovascular;  Laterality: N/A;  . CARDIOVERSION N/A 04/06/2019   Procedure: CARDIOVERSION;  Surgeon: [name], MD;  Location: [facility] ENDOSCOPY;  Service: Cardiovascular;  Laterality: N/A;  . CARDIOVERSION N/A 09/08/2019   Procedure: CARDIOVERSION;  Surgeon: Dorothy Spark, MD;  Location: [place];  Service: Cardiovascular;  Laterality: N/A;  . COLONOSCOPY WITH PROPOFOL N/A 03/24/2017   Procedure: COLONOSCOPY  WITH PROPOFOL;  Surgeon: Wilford Corner, MD;  Location: [place];  Service: Endoscopy;  Laterality: N/A;  . ESOPHAGOGASTRODUODENOSCOPY (EGD) WITH PROPOFOL N/A 03/24/2017   Procedure: ESOPHAGOGASTRODUODENOSCOPY (EGD) WITH PROPOFOL;  Surgeon: Wilford Corner, MD;  Location: [place];  Service: Endoscopy;  Laterality: N/A;  . LIPOMA EXCISION Right   . REDUCTION MAMMAPLASTY Bilateral 1998  . RETINAL DETACHMENT SURGERY Left   . SALPINGOOPHORECTOMY    . TEE WITHOUT CARDIOVERSION N/A 10/28/2017   Procedure: TRANSESOPHAGEAL ECHOCARDIOGRAM (TEE);  Surgeon: Lelon Perla, MD;  Location: [facility] ENDOSCOPY;  Service: Cardiovascular;  Laterality: N/A;  . [place]   at 86 years of  age    Family History  Problem Relation Age of Onset  . Pneumonia Father   . Heart attack Mother   . Hypertension Mother   . Stroke Mother   . Diabetes Mother   . Aortic aneurysm Mother        abdominal aortic aneurysm  . Cancer Mother        bladder  . Aortic aneurysm Sister        abdominal aortic aneurysm    Social History   Socioeconomic History  . Marital status: Married    Spouse name: Not on file  . Number of children: Not on file  . Years of education: Not on file  . Highest education level: Not on file  Occupational History  . Not on file  Tobacco Use  . Smoking status: Never Smoker  . Smokeless tobacco: Never Used  Vaping Use  . Vaping Use: Never used  Substance and Sexual Activity  . Alcohol use: No  . Drug use: No  . Sexual activity: Not on file  Other Topics Concern  . Not on file  Social History Narrative  . Not on file   Social Determinants of Health   Financial Resource Strain: Medium Risk  . Difficulty of Paying Living Expenses: Somewhat hard  Food Insecurity:   . Worried About Charity fundraiser in the Last Year: Not on file  . Ran Out of Food in the Last Year: Not on file  Transportation Needs: No Transportation Needs  . Lack of Transportation (Medical): No  . Lack of Transportation (Non-Medical): No  Physical Activity:   . Days of Exercise per Week: Not on file  . Minutes of Exercise per Session: Not on file  Stress:   . Feeling of Stress : Not on file  Social Connections:   . Frequency of Communication with Friends and Family: Not on file  . Frequency of Social Gatherings with Friends and Family: Not on file  . Attends Religious Services: Not on file  . Active Member of Clubs or Organizations: Not on file  . Attends Archivist Meetings: Not on file  . Marital Status: Not on file  Intimate Partner Violence:   . Fear of Current or Ex-Partner: Not on file  . Emotionally Abused: Not on file  . Physically Abused: Not on  file  . Sexually Abused: Not on file    Outpatient Medications Prior to Visit  Medication Sig Dispense Refill  . acetaminophen (TYLENOL) 500 MG tablet Take 1,000 mg by mouth every 6 (six) hours as needed for mild pain or moderate pain.     Marland Kitchen amiodarone (PACERONE) 200 MG tablet Take 100 mg by mouth at bedtime.  90 tablet 3  . CALCIUM PO Take 1 tablet by mouth daily.    . [employer] D3 125 MCG (  5000 UT) capsule TAKE 1 CAPSULE BY MOUTH EVERY DAY 30 capsule 3  . ELIQUIS 5 MG TABS tablet TAKE 1 TABLET BY MOUTH TWICE A DAY 180 tablet 1  . glucose blood (ONE TOUCH ULTRA TEST) test strip Check once daily. E11.40 100 each 11  . Insulin Pen Needle 29G X 5MM MISC Use once daily 100 each 3  . levothyroxine (SYNTHROID) 50 MCG tablet TAKE 1 TABLET BY MOUTH  DAILY 90 tablet 2  . loratadine (CLARITIN) 10 MG tablet Take 10 mg by mouth daily as needed for allergies.    . metFORMIN (GLUCOPHAGE-XR) 500 MG 24 hr tablet TAKE 1 TABLET BY MOUTH  TWICE DAILY 180 tablet 1  . metoprolol succinate (TOPROL XL) 25 MG 24 hr tablet Take 1 tablet (25 mg total) by mouth daily. 90 tablet 3  . omeprazole (PRILOSEC) 20 MG capsule TAKE 1 CAPSULE BY MOUTH  TWICE DAILY 180 capsule 2  . ONE TOUCH ULTRA TEST test strip CHECK ONCE A DAY 50 each 3  . TRESIBA FLEXTOUCH 100 UNIT/ML FlexTouch Pen INJECT 24 UNITS TOTAL INTO THE SKIN DAILY AT 10PM (Patient taking differently: Inject 18 Units into the skin at bedtime. ) 15 pen 5  . triamcinolone cream (KENALOG) 0.1 % Apply 1 application topically 2 (two) times daily. 30 g 2  . vitamin B-12 (CYANOCOBALAMIN) 1000 MCG tablet Take 1,000 mcg by mouth daily.    . furosemide (LASIX) 20 MG tablet Take 20 mg by mouth daily.    Marland Kitchen atorvastatin (LIPITOR) 10 MG tablet TAKE ONE-HALF TABLET BY  MOUTH EVERY OTHER DAY OR AS DIRECTED (Patient not taking: Reported on 05/14/2020) 23 tablet 3  . MELATONIN PO Take by mouth at bedtime. (Patient not taking: Reported on 05/14/2020)    . traZODone (DESYREL) 50 MG tablet TAKE  1/2 TO 1 TABLET BY MOUTH AT BEDTIME AS NEEDED FOR SLEEP (Patient not taking: Reported on 05/14/2020) 90 tablet 1  . furosemide (LASIX) 40 MG tablet Take 1 tablet (40 mg total) by mouth daily. (Patient not taking: Reported on 05/14/2020) 90 tablet 3   No facility-administered medications prior to visit.    Allergies  Allergen Reactions  . Bactrim Nausea And Vomiting  . Dilaudid [Hydromorphone Hcl] Other (See Comments)    Unknown  . Hydrocodone Nausea And Vomiting  . Invokana [Canagliflozin] Itching and Other (See Comments)    Yeast Infections   . Macrodantin Nausea And Vomiting    ROS Review of Systems  Constitutional: Positive for fatigue. Negative for chills and fever.  Respiratory: Negative for cough and shortness of breath.   Cardiovascular: Positive for leg swelling. Negative for chest pain and palpitations.  Neurological: Positive for weakness.  Psychiatric/Behavioral: Negative for confusion.      Objective:    Physical Exam Vitals reviewed.  Cardiovascular:     Rate and Rhythm: Normal rate and regular rhythm.  Pulmonary:     Effort: Pulmonary effort is normal.     Breath sounds: Normal breath sounds.  Musculoskeletal:     Right lower leg: Edema present.     Left lower leg: Edema present.     Comments: She has 2+ pitting edema lower legs ankles and feet bilaterally.  She has some bluish and purplish discoloration consistent with venous stasis.  No ulcerations.  Good capillary refill throughout.  Neurological:     Mental Status: She is alert.     BP (!) 124/58 (BP Location: Left Arm, Patient Position: Sitting, Cuff Size: Normal)   Pulse Marland Kitchen)  54   Temp 98.3 F (36.8 C) (Oral)   Ht 5' 1.5" (1.562 m)   Wt 150 lb 6.4 oz (68.2 kg)   SpO2 98%   BMI 27.96 kg/m  Wt Readings from Last 3 Encounters:  05/14/20 150 lb 6.4 oz (68.2 kg)  04/22/20 150 lb 11.2 oz (68.4 kg)  02/14/20 145 lb (65.8 kg)     Health Maintenance Due  Topic Date Due  . URINE MICROALBUMIN   04/07/2014  . FOOT EXAM  08/29/2015  . PNA vac Low Risk Adult (2 of 2 - PPSV23) 03/03/2017  . OPHTHALMOLOGY EXAM  07/30/2019  . INFLUENZA VACCINE  02/04/2020    There are no preventive care reminders to display for this patient.  Lab Results  Component Value Date   TSH 2.08 04/22/2020   Lab Results  Component Value Date   WBC 4.7 02/19/2020   HGB 9.4 (L) 02/19/2020   HCT 31.5 (L) 02/19/2020   MCV 80.8 02/19/2020   PLT 158 02/19/2020   Lab Results  Component Value Date   NA 139 04/22/2020   K 4.0 04/22/2020   CHLORIDE 111 (H) 04/03/2014   CO2 24 04/22/2020   GLUCOSE 210 (H) 04/22/2020   BUN 14 04/22/2020   CREATININE 0.81 04/22/2020   BILITOT 0.4 12/11/2019   ALKPHOS 87 12/11/2019   AST 39 12/11/2019   ALT 32 12/11/2019   PROT 6.4 12/11/2019   ALBUMIN 4.1 12/11/2019   CALCIUM 9.8 04/22/2020   ANIONGAP 8 02/19/2020   GFR 70.88 06/23/2019   Lab Results  Component Value Date   CHOL 94 06/23/2019   Lab Results  Component Value Date   HDL 35.20 (L) 06/23/2019   Lab Results  Component Value Date   LDLCALC 45 06/23/2019   Lab Results  Component Value Date   TRIG 70.0 06/23/2019   Lab Results  Component Value Date   CHOLHDL 3 06/23/2019   Lab Results  Component Value Date   HGBA1C 9.1 (H) 04/22/2020      Assessment & Plan:   Problem List Items Addressed This Visit      Unprioritized   Poorly controlled type 2 diabetes mellitus ([place]) - Primary    Other Visit Diagnoses    Bilateral leg edema          Meds ordered this encounter  Medications  . torsemide (DEMADEX) 10 MG tablet    Sig: Take 1 tablet (10 mg total) by mouth daily.    Dispense:  30 tablet    Refill:  1  . potassium chloride (KLOR-CON 10) 10 MEQ tablet    Sig: Take 1 tablet (10 mEq total) by mouth daily.    Dispense:  30 tablet    Refill:  1   -We discussed transition from furosemide to torsemide 10 mg daily -Continue to elevate legs frequently -Start also K-Lor 10 mEq 1  daily -Recommend daily weights -Follow-up in 2 weeks.  Recheck basic metabolic panel then -Also recommend home health nursing follow-up.  Consider compression wraps.  She is unable to put on compression hose daily.  Regarding her diabetes we recommend she start taking her Tresiba in the mornings. We did discuss other possible options such as Januvia we also discussed possible use of mealtime insulin which they are not excited about trying because of what that would require in terms of frequent blood sugar checks and high risk of hypoglycemia with short acting insulin  Follow-up: Return in about 2 weeks (around 05/28/2020).  [name], MD

## 2020-05-14 NOTE — Patient Instructions (Signed)
Stop the furosemide  Start Torsemide 10 mg once daily  Start the Potassium supplement  Elevate legs frequently  Change Tresiba to early AM.   Goal fasting glucose < 130.

## 2020-05-16 ENCOUNTER — Other Ambulatory Visit: Payer: Self-pay | Admitting: Family Medicine

## 2020-05-24 ENCOUNTER — Other Ambulatory Visit: Payer: Self-pay | Admitting: Adult Health

## 2020-05-24 ENCOUNTER — Telehealth: Payer: Self-pay | Admitting: Family Medicine

## 2020-05-24 NOTE — Telephone Encounter (Signed)
Pt is calling to see if she can take two trazodone (DESYREL) 50 MG due to her not being able to sleep until she comes in for her appointment on Tuesday 05/28/2020 @ 11:00.

## 2020-05-24 NOTE — Telephone Encounter (Signed)
Please advise 

## 2020-05-24 NOTE — Telephone Encounter (Signed)
Yes.  I think that will be safe enough to try two po qhs.

## 2020-05-24 NOTE — Telephone Encounter (Signed)
I spoke with pt's daughter, she is aware of message below.  

## 2020-05-28 ENCOUNTER — Ambulatory Visit (INDEPENDENT_AMBULATORY_CARE_PROVIDER_SITE_OTHER): Payer: Medicare Other | Admitting: Family Medicine

## 2020-05-28 ENCOUNTER — Encounter: Payer: Self-pay | Admitting: Family Medicine

## 2020-05-28 ENCOUNTER — Other Ambulatory Visit: Payer: Self-pay

## 2020-05-28 VITALS — BP 130/60 | HR 53 | Temp 98.2°F | Wt 145.2 lb

## 2020-05-28 DIAGNOSIS — R600 Localized edema: Secondary | ICD-10-CM | POA: Diagnosis not present

## 2020-05-28 DIAGNOSIS — E1165 Type 2 diabetes mellitus with hyperglycemia: Secondary | ICD-10-CM | POA: Diagnosis not present

## 2020-05-28 MED ORDER — VITAMIN B-12 1000 MCG PO TABS: 1000.0000 ug | ORAL_TABLET | Freq: Every day | ORAL | 3 refills | Status: AC

## 2020-05-28 MED ORDER — CVS D3 125 MCG (5000 UT) PO CAPS
5000.0000 [IU] | ORAL_CAPSULE | Freq: Every day | ORAL | 3 refills | Status: DC
Start: 2020-05-28 — End: 2020-07-26

## 2020-05-28 NOTE — Progress Notes (Signed)
Established Patient Office Visit  Subjective:  Patient ID: Julie Norton, female    DOB: 04-Feb-1930  Age: 86 y.o. MRN: [id number]  CC:  Chief Complaint  Patient presents with  . Follow-up    HPI Julie Norton presents for follow-up regarding her bilateral leg edema.  She had some weight gain and increased edema which was not responding to furosemide 40 mg daily.  We switched her to torsemide 10 mg daily and she has seen some improvement.  Her home weights around 142 pounds.  By our scales, she has lost about 5 pounds.  She still has some peripheral edema but overall improved.  She has some chronic dyspnea at baseline but unchanged.  No orthopnea.  We had made a referral for home health but unfortunately they have been unable to get anyone to go out to see her yet.  Apparently the initial company that was referred to does not participate with [place] [facility].  Recent A1c 9.1%.  She has had steroid injections per orthopedic for chronic back issues and that may have exacerbated.  She was having some actual low readings in the mornings but higher later in the day and we had her switch time of [place] to mornings and at this point she has not had any hypoglycemic symptoms  Past Medical History:  Diagnosis Date  . Abdominal adhesions   . Abdominal gas pain    suspected -- may be related to intermittent right upper quadrant discomfort radiating around her back      . Anemia   . Anginal pain ([place])   . Arthritis   . Atrial fibrillation ([place])   . Breast cancer ([place])   . Breast mass    left breast  . Cancer ([place])    left breast  . Chest pain    intermittent right upper quadrant discomfort radiating around her back      . Complication of anesthesia   . Diabetes mellitus without complication ([place])    Type II  . Dyspepsia   . Dyspnea   . GERD (gastroesophageal reflux disease)   . H/O: hysterectomy 1978  . History of cardioversion   . Hypercholesteremia   . Hypothyroidism   .  Mitral insufficiency   . Personal history of radiation therapy   . Pneumonia   . PONV (postoperative nausea and vomiting)   . Right bundle branch block (RBBB)   . Tendinitis    of the right arm and shoulder  . Torn tendon    right shoulder    Past Surgical History:  Procedure Laterality Date  . ABDOMINAL HYSTERECTOMY    . APPENDECTOMY  1978  . BLADDER REPAIR    . BREAST LUMPECTOMY Left 02/24/2011   central partial mastectomy - dr Margot Chimes  . BREAST REDUCTION SURGERY    . CARDIOVERSION N/A 10/28/2017   Procedure: CARDIOVERSION;  Surgeon: Lelon Perla, MD;  Location[facility] ENDOSCOPY;  Service: Cardiovascular;  Laterality: N/A;  . CARDIOVERSION N/A 04/06/2019   Procedure: CARDIOVERSION;  Surgeon: [name], MD;  Location: [facility] ENDOSCOPY;  Service: Cardiovascular;  Laterality: N/A;  . CARDIOVERSION N/A 09/08/2019   Procedure: CARDIOVERSION;  Surgeon: Dorothy Spark, MD;  Location: [place];  Service: Cardiovascular;  Laterality: N/A;  . COLONOSCOPY WITH PROPOFOL N/A 03/24/2017   Procedure: COLONOSCOPY WITH PROPOFOL;  Surgeon: Wilford Corner, MD;  Location: [place];  Service: Endoscopy;  Laterality: N/A;  . ESOPHAGOGASTRODUODENOSCOPY (EGD) WITH PROPOFOL N/A 03/24/2017   Procedure: ESOPHAGOGASTRODUODENOSCOPY (EGD) WITH PROPOFOL;  Surgeon: Wilford Corner, MD;  Location: [place];  Service: Endoscopy;  Laterality: N/A;  . LIPOMA EXCISION Right   . REDUCTION MAMMAPLASTY Bilateral 1998  . RETINAL DETACHMENT SURGERY Left   . SALPINGOOPHORECTOMY    . TEE WITHOUT CARDIOVERSION N/A 10/28/2017   Procedure: TRANSESOPHAGEAL ECHOCARDIOGRAM (TEE);  Surgeon: Lelon Perla, MD;  Location: [facility] ENDOSCOPY;  Service: Cardiovascular;  Laterality: N/A;  . [place]   at 86 years of age    Family History  Problem Relation Age of Onset  . Pneumonia Father   . Heart attack Mother   . Hypertension Mother   . Stroke Mother   . Diabetes Mother   .  Aortic aneurysm Mother        abdominal aortic aneurysm  . Cancer Mother        bladder  . Aortic aneurysm Sister        abdominal aortic aneurysm    Social History   Socioeconomic History  . Marital status: Married    Spouse name: Not on file  . Number of children: Not on file  . Years of education: Not on file  . Highest education level: Not on file  Occupational History  . Not on file  Tobacco Use  . Smoking status: Never Smoker  . Smokeless tobacco: Never Used  Vaping Use  . Vaping Use: Never used  Substance and Sexual Activity  . Alcohol use: No  . Drug use: No  . Sexual activity: Not on file  Other Topics Concern  . Not on file  Social History Narrative  . Not on file   Social Determinants of Health   Financial Resource Strain: Medium Risk  . Difficulty of Paying Living Expenses: Somewhat hard  Food Insecurity:   . Worried About Charity fundraiser in the Last Year: Not on file  . Ran Out of Food in the Last Year: Not on file  Transportation Needs: No Transportation Needs  . Lack of Transportation (Medical): No  . Lack of Transportation (Non-Medical): No  Physical Activity:   . Days of Exercise per Week: Not on file  . Minutes of Exercise per Session: Not on file  Stress:   . Feeling of Stress : Not on file  Social Connections:   . Frequency of Communication with Friends and Family: Not on file  . Frequency of Social Gatherings with Friends and Family: Not on file  . Attends Religious Services: Not on file  . Active Member of Clubs or Organizations: Not on file  . Attends Archivist Meetings: Not on file  . Marital Status: Not on file  Intimate Partner Violence:   . Fear of Current or Ex-Partner: Not on file  . Emotionally Abused: Not on file  . Physically Abused: Not on file  . Sexually Abused: Not on file    Outpatient Medications Prior to Visit  Medication Sig Dispense Refill  . acetaminophen (TYLENOL) 500 MG tablet Take 1,000 mg by  mouth every 6 (six) hours as needed for mild pain or moderate pain.     Marland Kitchen amiodarone (PACERONE) 200 MG tablet Take 100 mg by mouth at bedtime.  90 tablet 3  . atorvastatin (LIPITOR) 10 MG tablet TAKE ONE-HALF TABLET BY  MOUTH EVERY OTHER DAY OR AS DIRECTED 23 tablet 3  . CALCIUM PO Take 1 tablet by mouth daily.    Marland Kitchen ELIQUIS 5 MG TABS tablet TAKE 1 TABLET BY MOUTH TWICE A DAY 180 tablet  1  . glucose blood (ONE TOUCH ULTRA TEST) test strip Check once daily. E11.40 100 each 11  . Insulin Pen Needle 29G X 5MM MISC Use once daily 100 each 3  . levothyroxine (SYNTHROID) 50 MCG tablet TAKE 1 TABLET BY MOUTH  DAILY 90 tablet 2  . loratadine (CLARITIN) 10 MG tablet Take 10 mg by mouth daily as needed for allergies.    Marland Kitchen MELATONIN PO Take by mouth at bedtime.     . metFORMIN (GLUCOPHAGE-XR) 500 MG 24 hr tablet TAKE 1 TABLET BY MOUTH  TWICE DAILY 180 tablet 1  . metoprolol succinate (TOPROL-XL) 25 MG 24 hr tablet TAKE 1 TABLET BY MOUTH EVERY DAY 90 tablet 3  . omeprazole (PRILOSEC) 20 MG capsule TAKE 1 CAPSULE BY MOUTH  TWICE DAILY 180 capsule 2  . ONE TOUCH ULTRA TEST test strip CHECK ONCE A DAY 50 each 3  . potassium chloride (KLOR-CON 10) 10 MEQ tablet Take 1 tablet (10 mEq total) by mouth daily. 30 tablet 1  . torsemide (DEMADEX) 10 MG tablet Take 1 tablet (10 mg total) by mouth daily. 30 tablet 1  . traZODone (DESYREL) 50 MG tablet TAKE 1/2 TO 1 TABLET BY MOUTH AT BEDTIME AS NEEDED FOR SLEEP 90 tablet 1  . TRESIBA FLEXTOUCH 100 UNIT/ML FlexTouch Pen INJECT 24 UNITS TOTAL INTO THE SKIN DAILY AT 10PM (Patient taking differently: Inject 18 Units into the skin at bedtime. ) 15 pen 5  . triamcinolone cream (KENALOG) 0.1 % Apply 1 application topically 2 (two) times daily. 30 g 2  . [employer] D3 125 MCG (5000 UT) capsule TAKE 1 CAPSULE BY MOUTH EVERY DAY 30 capsule 3  . vitamin B-12 (CYANOCOBALAMIN) 1000 MCG tablet Take 1,000 mcg by mouth daily.     No facility-administered medications prior to visit.     Allergies  Allergen Reactions  . Bactrim Nausea And Vomiting  . Dilaudid [Hydromorphone Hcl] Other (See Comments)    Unknown  . Hydrocodone Nausea And Vomiting  . Invokana [Canagliflozin] Itching and Other (See Comments)    Yeast Infections   . Macrodantin Nausea And Vomiting    ROS Review of Systems  Constitutional: Negative for chills and fever.  Respiratory: Negative for cough and shortness of breath.   Cardiovascular: Positive for leg swelling. Negative for chest pain and palpitations.  Gastrointestinal: Negative for abdominal pain.  Genitourinary: Negative for dysuria.  Psychiatric/Behavioral: Positive for sleep disturbance.      Objective:    Physical Exam Vitals reviewed.  Constitutional:      Appearance: Normal appearance.  Cardiovascular:     Rate and Rhythm: Normal rate and regular rhythm.  Pulmonary:     Effort: Pulmonary effort is normal.     Breath sounds: Normal breath sounds.  Musculoskeletal:     Right lower leg: Edema present.     Left lower leg: Edema present.     Comments: She does have some mild pitting edema around her ankles and lower legs bilaterally but overall improved.  No ulcerations.  Skin:    Comments: Very dry skin involving lower legs and feet  Neurological:     Mental Status: She is alert.     BP 130/60 (BP Location: Left Arm, Patient Position: Sitting, Cuff Size: Large)   Pulse (!) 53   Temp 98.2 F (36.8 C) (Oral)   Wt 145 lb 3.2 oz (65.9 kg)   SpO2 98%   BMI 26.99 kg/m  Wt Readings from Last 3 Encounters:  05/28/20  145 lb 3.2 oz (65.9 kg)  05/14/20 150 lb 6.4 oz (68.2 kg)  04/22/20 150 lb 11.2 oz (68.4 kg)     Health Maintenance Due  Topic Date Due  . URINE MICROALBUMIN  04/07/2014  . FOOT EXAM  08/29/2015  . PNA vac Low Risk Adult (2 of 2 - PPSV23) 03/03/2017  . OPHTHALMOLOGY EXAM  07/30/2019  . INFLUENZA VACCINE  02/04/2020    There are no preventive care reminders to display for this patient.  Lab  Results  Component Value Date   TSH 2.08 04/22/2020   Lab Results  Component Value Date   WBC 4.7 02/19/2020   HGB 9.4 (L) 02/19/2020   HCT 31.5 (L) 02/19/2020   MCV 80.8 02/19/2020   PLT 158 02/19/2020   Lab Results  Component Value Date   NA 139 04/22/2020   K 4.0 04/22/2020   CHLORIDE 111 (H) 04/03/2014   CO2 24 04/22/2020   GLUCOSE 210 (H) 04/22/2020   BUN 14 04/22/2020   CREATININE 0.81 04/22/2020   BILITOT 0.4 12/11/2019   ALKPHOS 87 12/11/2019   AST 39 12/11/2019   ALT 32 12/11/2019   PROT 6.4 12/11/2019   ALBUMIN 4.1 12/11/2019   CALCIUM 9.8 04/22/2020   ANIONGAP 8 02/19/2020   GFR 70.88 06/23/2019   Lab Results  Component Value Date   CHOL 94 06/23/2019   Lab Results  Component Value Date   HDL 35.20 (L) 06/23/2019   Lab Results  Component Value Date   LDLCALC 45 06/23/2019   Lab Results  Component Value Date   TRIG 70.0 06/23/2019   Lab Results  Component Value Date   CHOLHDL 3 06/23/2019   Lab Results  Component Value Date   HGBA1C 9.1 (H) 04/22/2020      Assessment & Plan:   #1 bilateral leg edema improved with recent change from furosemide to torsemide  -Continue frequent leg elevation -We again discussed the topic of venous compression and they will look into seeing if we can find options for her to build to get assistance with getting on compression stockings -Continue torsemide 10 mg daily and potassium supplement -Recheck basic metabolic panel today  #2 type 2 diabetes currently treated with insulin -Stable by home readings.  Recent A1c likely exacerbated by multiple steroid injections per orthopedics -Recommend 1 month follow-up and reassess A1c at that time  Meds ordered this encounter  Medications  . Cholecalciferol ([employer]S D3) 125 MCG (5000 UT) capsule    Sig: Take 1 capsule (5,000 Units total) by mouth daily.    Dispense:  90 capsule    Refill:  3  . vitamin B-12 (CYANOCOBALAMIN) 1000 MCG tablet    Sig: Take 1 tablet  (1,000 mcg total) by mouth daily.    Dispense:  90 tablet    Refill:  3    Follow-up: Return in about 3 weeks (around 06/18/2020).    [name], MD

## 2020-05-28 NOTE — Patient Instructions (Signed)
Peripheral Edema  Peripheral edema is swelling that is caused by a buildup of fluid. Peripheral edema most often affects the lower legs, ankles, and feet. It can also develop in the arms, hands, and face. The area of the body that has peripheral edema will look swollen. It may also feel heavy or warm. Your clothes may start to feel tight. Pressing on the area may make a temporary dent in your skin. You may not be able to move your swollen arm or leg as much as usual. There are many causes of peripheral edema. It can happen because of a complication of other conditions such as congestive heart failure, kidney disease, or a problem with your blood circulation. It also can be a side effect of certain medicines or because of an infection. It often happens to women during pregnancy. Sometimes, the cause is not known. Follow these instructions at home: Managing pain, stiffness, and swelling   Raise (elevate) your legs while you are sitting or lying down.  Move around often to prevent stiffness and to lessen swelling.  Do not sit or stand for long periods of time.  Wear support stockings as told by your health care provider. Medicines  Take over-the-counter and prescription medicines only as told by your health care provider.  Your health care provider may prescribe medicine to help your body get rid of excess water (diuretic). General instructions  Pay attention to any changes in your symptoms.  Follow instructions from your health care provider about limiting salt (sodium) in your diet. Sometimes, eating less salt may reduce swelling.  Moisturize skin daily to help prevent skin from cracking and draining.  Keep all follow-up visits as told by your health care provider. This is important. Contact a health care provider if you have:  A fever.  Edema that starts suddenly or is getting worse, especially if you are pregnant or have a medical condition.  Swelling in only one leg.  Increased  swelling, redness, or pain in one or both of your legs.  Drainage or sores at the area where you have edema. Get help right away if you:  Develop shortness of breath, especially when you are lying down.  Have pain in your chest or abdomen.  Feel weak.  Feel faint. Summary  Peripheral edema is swelling that is caused by a buildup of fluid. Peripheral edema most often affects the lower legs, ankles, and feet.  Move around often to prevent stiffness and to lessen swelling. Do not sit or stand for long periods of time.  Pay attention to any changes in your symptoms.  Contact a health care provider if you have edema that starts suddenly or is getting worse, especially if you are pregnant or have a medical condition.  Get help right away if you develop shortness of breath, especially when lying down. This information is not intended to replace advice given to you by your health care provider. Make sure you discuss any questions you have with your health care provider. Document Revised: 03/16/2018 Document Reviewed: 03/16/2018 Elsevier Patient Education  2020 [employer]  

## 2020-05-29 ENCOUNTER — Other Ambulatory Visit: Payer: Self-pay | Admitting: Family Medicine

## 2020-05-29 DIAGNOSIS — E0590 Thyrotoxicosis, unspecified without thyrotoxic crisis or storm: Secondary | ICD-10-CM

## 2020-06-03 ENCOUNTER — Telehealth: Payer: Self-pay | Admitting: Family Medicine

## 2020-06-03 ENCOUNTER — Other Ambulatory Visit (HOSPITAL_COMMUNITY): Payer: Medicare Other

## 2020-06-03 NOTE — Telephone Encounter (Signed)
fyi

## 2020-06-03 NOTE — Telephone Encounter (Signed)
Hopefully they can keep trying.  Family was very interested in getting home health set up

## 2020-06-03 NOTE — Telephone Encounter (Signed)
[facility] is calling and stated that they received a request for Home Heath orders but haven't been able to reach the patient, please advise. CB is [phone]

## 2020-06-04 NOTE — Telephone Encounter (Signed)
Patient's family has been in contact with home health, they called yesterday.

## 2020-06-05 ENCOUNTER — Other Ambulatory Visit: Payer: Self-pay | Admitting: Family Medicine

## 2020-06-06 ENCOUNTER — Telehealth: Payer: Self-pay | Admitting: Family Medicine

## 2020-06-06 ENCOUNTER — Ambulatory Visit (HOSPITAL_BASED_OUTPATIENT_CLINIC_OR_DEPARTMENT_OTHER): Admission: RE | Admit: 2020-06-06 | Payer: Medicare Other | Source: Ambulatory Visit | Admitting: Surgery

## 2020-06-06 DIAGNOSIS — N6082 Other benign mammary dysplasias of left breast: Secondary | ICD-10-CM | POA: Diagnosis not present

## 2020-06-06 DIAGNOSIS — C50912 Malignant neoplasm of unspecified site of left female breast: Secondary | ICD-10-CM | POA: Diagnosis not present

## 2020-06-06 DIAGNOSIS — I872 Venous insufficiency (chronic) (peripheral): Secondary | ICD-10-CM | POA: Diagnosis not present

## 2020-06-06 DIAGNOSIS — Z7901 Long term (current) use of anticoagulants: Secondary | ICD-10-CM | POA: Diagnosis not present

## 2020-06-06 DIAGNOSIS — Z7984 Long term (current) use of oral hypoglycemic drugs: Secondary | ICD-10-CM | POA: Diagnosis not present

## 2020-06-06 DIAGNOSIS — E7800 Pure hypercholesterolemia, unspecified: Secondary | ICD-10-CM | POA: Diagnosis not present

## 2020-06-06 DIAGNOSIS — I340 Nonrheumatic mitral (valve) insufficiency: Secondary | ICD-10-CM | POA: Diagnosis not present

## 2020-06-06 DIAGNOSIS — R262 Difficulty in walking, not elsewhere classified: Secondary | ICD-10-CM | POA: Diagnosis not present

## 2020-06-06 DIAGNOSIS — E538 Deficiency of other specified B group vitamins: Secondary | ICD-10-CM | POA: Diagnosis not present

## 2020-06-06 DIAGNOSIS — I4891 Unspecified atrial fibrillation: Secondary | ICD-10-CM | POA: Diagnosis not present

## 2020-06-06 DIAGNOSIS — I209 Angina pectoris, unspecified: Secondary | ICD-10-CM | POA: Diagnosis not present

## 2020-06-06 DIAGNOSIS — Z794 Long term (current) use of insulin: Secondary | ICD-10-CM | POA: Diagnosis not present

## 2020-06-06 DIAGNOSIS — M1990 Unspecified osteoarthritis, unspecified site: Secondary | ICD-10-CM | POA: Diagnosis not present

## 2020-06-06 DIAGNOSIS — K219 Gastro-esophageal reflux disease without esophagitis: Secondary | ICD-10-CM | POA: Diagnosis not present

## 2020-06-06 DIAGNOSIS — I509 Heart failure, unspecified: Secondary | ICD-10-CM | POA: Diagnosis not present

## 2020-06-06 DIAGNOSIS — G4700 Insomnia, unspecified: Secondary | ICD-10-CM | POA: Diagnosis not present

## 2020-06-06 DIAGNOSIS — E1165 Type 2 diabetes mellitus with hyperglycemia: Secondary | ICD-10-CM | POA: Diagnosis not present

## 2020-06-06 DIAGNOSIS — E785 Hyperlipidemia, unspecified: Secondary | ICD-10-CM | POA: Diagnosis not present

## 2020-06-06 DIAGNOSIS — D641 Secondary sideroblastic anemia due to disease: Secondary | ICD-10-CM | POA: Diagnosis not present

## 2020-06-06 DIAGNOSIS — I4510 Unspecified right bundle-branch block: Secondary | ICD-10-CM | POA: Diagnosis not present

## 2020-06-06 DIAGNOSIS — E039 Hypothyroidism, unspecified: Secondary | ICD-10-CM | POA: Diagnosis not present

## 2020-06-06 HISTORY — DX: Nonrheumatic mitral (valve) insufficiency: I34.0

## 2020-06-06 HISTORY — DX: Unspecified osteoarthritis, unspecified site: M19.90

## 2020-06-06 HISTORY — DX: Anemia, unspecified: D64.9

## 2020-06-06 HISTORY — DX: Dyspnea, unspecified: R06.00

## 2020-06-06 HISTORY — DX: Unspecified atrial fibrillation: I48.91

## 2020-06-06 HISTORY — DX: Personal history of other medical treatment: Z92.89

## 2020-06-06 HISTORY — DX: Unspecified right bundle-branch block: I45.10

## 2020-06-06 HISTORY — DX: Angina pectoris, unspecified: I20.9

## 2020-06-06 HISTORY — DX: Other specified postprocedural states: Z98.890

## 2020-06-06 SURGERY — BREAST LUMPECTOMY WITH RADIOACTIVE SEED LOCALIZATION
Anesthesia: General | Site: Breast | Laterality: Left

## 2020-06-06 NOTE — Telephone Encounter (Signed)
[facility] is calling and requesting verbal orders for home heath and PT orders to evaluate and treat, please advise. CB [phone] and ask for  [facility]

## 2020-06-06 NOTE — Telephone Encounter (Signed)
Verbal given 

## 2020-06-08 NOTE — Progress Notes (Signed)
Cardiology Office Note   Date:  06/13/2020   ID:  Julie Norton, DOB [birth year], MRN [id number]  PCP:  Eulas Post, MD  Cardiologist: Findley Blankenbaker Martinique MD  Chief Complaint  Patient presents with  . Edema      History of Present Illness: Julie Norton is a 86 y.o. female who presents for follow up of Atrial fibrillation.   She has a past history of hypercholesterolemia. She also has a history of DM on  insulin. Echo in 2015 showed mild MR with normal LV function. She had a normal Myoview study in November 2017.   She was seen by Dr. Elease Hashimoto on 09/20/17. Found to be in Afib with rate 95. Started on Eliquis. Follow up Echo showed mild biatrial enlargement and normal LV function. She  noted some fatigue, decreased energy and leg swelling. Some dyspnea and chest tightness. Was started on lasix with improvement of these symptoms. She subsequently underwent TEE guided DCCV on 10/28/17 with return to NSR.   Was seen in early September 2020 with  increased leg swelling for 2 months. Noted SOB going up stairs. Was found to be in Afib with rate 105. Toprol XL was added for rate control. Lasix was increased.  We did try her on Flecainide but this resulted in severe nausea and diarrhea and was discontinued.   She was seen in the ED on 03/27/19 with complaints of epigastric pain/abdominal pain. Mildly elevated lipase. Other labs OK. Ecg in Afib with rate 80. CXR and CT abdomen were normal.  She subsequently underwent successful DCCV on 04/06/19. Seen back on October 27 and was in NSR. Dr Burchette's note of Dec 18 thought she was back in Afib but no Ecg done. Rate controlled. When seen in our office she was in Afib.  She was started on amiodarone  at 400 mg daily. She underwent repeat DCCV on March 5 successfully. Due to some nausea amiodarone was reduced to 200 mg daily.   Was seen by Dr Elease Hashimoto in October with increased LE edema. Was ultimately switched to torsemide with some  improvement. She has planned left breast lumpectomy but this has been postponed a couple of times. She is see today with her son. Notes edema is better but still there and legs are painful to touch. She had several teeth extracted so is eating a soft diet. Notes hair loss. No dyspnea, orthopnea, PND or palpitations.      Past Medical History:  Diagnosis Date  . Abdominal adhesions   . Abdominal gas pain    suspected -- may be related to intermittent right upper quadrant discomfort radiating around her back      . Anemia   . Anginal pain ([place])   . Arthritis   . Atrial fibrillation ([place])   . Breast cancer ([place])   . Breast mass    left breast  . Cancer ([place])    left breast  . Chest pain    intermittent right upper quadrant discomfort radiating around her back      . Complication of anesthesia   . Diabetes mellitus without complication ([place])    Type II  . Dyspepsia   . Dyspnea   . GERD (gastroesophageal reflux disease)   . H/O: hysterectomy 1978  . History of cardioversion   . Hypercholesteremia   . Hypothyroidism   . Mitral insufficiency   . Personal history of radiation therapy   . Pneumonia   . PONV (postoperative nausea and vomiting)   .  Right bundle branch block (RBBB)   . Tendinitis    of the right arm and shoulder  . Torn tendon    right shoulder    Past Surgical History:  Procedure Laterality Date  . ABDOMINAL HYSTERECTOMY    . APPENDECTOMY  1978  . BLADDER REPAIR    . BREAST LUMPECTOMY Left 02/24/2011   central partial mastectomy - dr Margot Chimes  . BREAST REDUCTION SURGERY    . CARDIOVERSION N/A 10/28/2017   Procedure: CARDIOVERSION;  Surgeon: Lelon Perla, MD;  Location: [facility] ENDOSCOPY;  Service: Cardiovascular;  Laterality: N/A;  . CARDIOVERSION N/A 04/06/2019   Procedure: CARDIOVERSION;  Surgeon: [name], MD;  Location: [facility] ENDOSCOPY;  Service: Cardiovascular;  Laterality: N/A;  . CARDIOVERSION N/A 09/08/2019   Procedure: CARDIOVERSION;  Surgeon:  Dorothy Spark, MD;  Location: [place];  Service: Cardiovascular;  Laterality: N/A;  . COLONOSCOPY WITH PROPOFOL N/A 03/24/2017   Procedure: COLONOSCOPY WITH PROPOFOL;  Surgeon: Wilford Corner, MD;  Location: [place];  Service: Endoscopy;  Laterality: N/A;  . ESOPHAGOGASTRODUODENOSCOPY (EGD) WITH PROPOFOL N/A 03/24/2017   Procedure: ESOPHAGOGASTRODUODENOSCOPY (EGD) WITH PROPOFOL;  Surgeon: Wilford Corner, MD;  Location: [place];  Service: Endoscopy;  Laterality: N/A;  . LIPOMA EXCISION Right   . REDUCTION MAMMAPLASTY Bilateral 1998  . RETINAL DETACHMENT SURGERY Left   . SALPINGOOPHORECTOMY    . TEE WITHOUT CARDIOVERSION N/A 10/28/2017   Procedure: TRANSESOPHAGEAL ECHOCARDIOGRAM (TEE);  Surgeon: Lelon Perla, MD;  Location: [facility] ENDOSCOPY;  Service: Cardiovascular;  Laterality: N/A;  . TONSILLECTOMY AND ADENOIDECTOMY  1948   at 86 years of age     Current Outpatient Medications  Medication Sig Dispense Refill  . acetaminophen (TYLENOL) 500 MG tablet Take 1,000 mg by mouth every 6 (six) hours as needed for mild pain or moderate pain.     Marland Kitchen amiodarone (PACERONE) 200 MG tablet Take 100 mg by mouth at bedtime.  90 tablet 3  . CALCIUM PO Take 1 tablet by mouth daily.    . Cholecalciferol (CVS D3) 125 MCG (5000 UT) capsule Take 1 capsule (5,000 Units total) by mouth daily. 90 capsule 3  . ELIQUIS 5 MG TABS tablet TAKE 1 TABLET BY MOUTH TWICE A DAY 180 tablet 1  . glucose blood (ONE TOUCH ULTRA TEST) test strip Check once daily. E11.40 100 each 11  . Insulin Pen Needle 29G X 5MM MISC Use once daily 100 each 3  . levothyroxine (SYNTHROID) 50 MCG tablet TAKE 1 TABLET BY MOUTH  DAILY 90 tablet 3  . loratadine (CLARITIN) 10 MG tablet Take 10 mg by mouth daily as needed for allergies.    . metFORMIN (GLUCOPHAGE-XR) 500 MG 24 hr tablet TAKE 1 TABLET BY MOUTH  TWICE DAILY 180 tablet 1  . metoprolol succinate (TOPROL-XL) 25 MG 24 hr tablet TAKE 1 TABLET BY MOUTH EVERY DAY 90  tablet 3  . omeprazole (PRILOSEC) 20 MG capsule TAKE 1 CAPSULE BY MOUTH  TWICE DAILY 180 capsule 3  . ONE TOUCH ULTRA TEST test strip CHECK ONCE A DAY 50 each 3  . potassium chloride (KLOR-CON) 10 MEQ tablet TAKE 1 TABLET BY MOUTH EVERY DAY 30 tablet 1  . torsemide (DEMADEX) 10 MG tablet TAKE 1 TABLET BY MOUTH EVERY DAY 30 tablet 1  . traZODone (DESYREL) 50 MG tablet TAKE 1/2 TO 1 TABLET BY MOUTH AT BEDTIME AS NEEDED FOR SLEEP 90 tablet 1  . TRESIBA FLEXTOUCH 100 UNIT/ML FlexTouch Pen INJECT 24 UNITS TOTAL INTO THE SKIN DAILY  AT 10PM (Patient taking differently: Inject 18 Units into the skin at bedtime.) 15 pen 5  . triamcinolone cream (KENALOG) 0.1 % Apply 1 application topically 2 (two) times daily. 30 g 2  . vitamin B-12 (CYANOCOBALAMIN) 1000 MCG tablet Take 1 tablet (1,000 mcg total) by mouth daily. 90 tablet 3   No current facility-administered medications for this visit.    Allergies:   Bactrim, Dilaudid [hydromorphone hcl], Hydrocodone, Invokana [canagliflozin], and Macrodantin    Social History:  The patient  reports that she has never smoked. She has never used smokeless tobacco. She reports that she does not drink alcohol and does not use drugs.   Family History:  The patient's family history includes Aortic aneurysm in her mother and sister; Cancer in her mother; Diabetes in her mother; Heart attack in her mother; Hypertension in her mother; Pneumonia in her father; Stroke in her mother.    ROS:  Please see the history of present illness.   Otherwise, review of systems are positive for none.   All other systems are reviewed and negative.    PHYSICAL EXAM: VS:  BP (!) 133/55   Pulse (!) 56   Ht 5\' 1"  (1.549 m)   Wt 140 lb (63.5 kg)   SpO2 99%   BMI 26.45 kg/m  , BMI Body mass index is 26.45 kg/m. GENERAL:  Well appearing WF  In NAD HEENT:  PERRL, EOMI, sclera are clear. Oropharynx is clear. NECK:  No jugular venous distention, carotid upstroke brisk and symmetric, no  bruits, no thyromegaly or adenopathy LUNGS:  Clear to auscultation bilaterally CHEST:  Unremarkable HEART:  RRR,  PMI not displaced or sustained,S1 and S2 within normal limits, no S3, no S4: no clicks, no rubs, there is a soft 1-2/6 SEM ABD:  Soft, nontender. BS +, no masses or bruits. No hepatomegaly, no splenomegaly EXT:  2 + pulses throughout, 1-2+ edema, no cyanosis no clubbing SKIN:  Warm and dry.  No rashes NEURO:  Alert and oriented x 3. Cranial nerves II through XII intact. PSYCH:  Cognitively intact  EKG:  EKG is  Not ordered today.   Recent Labs: 12/11/2019: ALT 32 02/19/2020: Hemoglobin 9.4; Platelets 158 04/22/2020: Brain Natriuretic Peptide 277; BUN 14; Creat 0.81; Potassium 4.0; Sodium 139; TSH 2.08    Lipid Panel    Component Value Date/Time   CHOL 94 06/23/2019 0825   CHOL 135 04/26/2017 0840   TRIG 70.0 06/23/2019 0825   HDL 35.20 (L) 06/23/2019 0825   HDL 45 04/26/2017 0840   CHOLHDL 3 06/23/2019 0825   VLDL 14.0 06/23/2019 0825   LDLCALC 45 06/23/2019 0825   LDLCALC 70 04/26/2017 0840      Wt Readings from Last 3 Encounters:  06/13/20 140 lb (63.5 kg)  05/28/20 145 lb 3.2 oz (65.9 kg)  05/14/20 150 lb 6.4 oz (68.2 kg)      Lexiscan myoview 05/26/16:Study Highlights    Nuclear stress EF: 72%.  There was no ST segment deviation noted during stress.  This is a low risk study.  The left ventricular ejection fraction is hyperdynamic (>65%).   1. Small, mild mid-anteroseptal perfusion defect.  This defect was actually more intense at rest than with stress.  No ischemia.  Suspect attenuation.  2. EF 72% with normal wall motion.  3. Overall low risk study.     Echo 09/24/17: Study Conclusions  - Left ventricle: The cavity size was normal. Systolic function was   normal. The estimated ejection fraction  was in the range of 55%   to 60%. Wall motion was normal; there were no regional wall   motion abnormalities. - Mitral valve: There was mild  regurgitation. - Left atrium: The atrium was mildly dilated. - Right atrium: The atrium was mildly dilated. - Atrial septum: No defect or patent foramen ovale was identified. - Pulmonary arteries: Systolic pressure was mildly increased. PA   peak pressure: 37 mm Hg (S).    ASSESSMENT AND PLAN:  1. Atrial fibrillation. Recurrent. [place] vasc score of 4. On Eliquis. Rate controlled on Toprol XL.  S/p TEE guided DCCV in April 2019. Repeat DCCV on April 06, 2019 with ERAD. She was intolerant of Flecainide. She was symptomatic with her Afib so was started on amiodarone. S/p  successful repeat DCCV on September 08, 2019. She is maintaining NSR on exam.  Continue  Amiodarone at 200 mg daily. if she gets through her surgery OK will consider reducing dose further. CMET and TFTs are OK.   2.  Acute on chronic diastolic CHF. Recent increase in edema. This has improved with weight loss of about 10 lbs. Encourage sodium restriction, elevation of feet and compression hose. Will repeat BMET and BNP. Will also check UA to make sure she doesn't have protein in her urine. Will update Echo.   3. Hypercholesterolemia. On low dose lipitor.   4. Diabetes mellitus- now on insulin.    5. History of Breast CA. Plan for left breast lumpectomy. Will await Echo results to clear.     Signed, Jahree Dermody [place] MD, [facility]   06/13/2020 1:42 PM    [facility]

## 2020-06-11 DIAGNOSIS — E1165 Type 2 diabetes mellitus with hyperglycemia: Secondary | ICD-10-CM | POA: Diagnosis not present

## 2020-06-11 DIAGNOSIS — Z794 Long term (current) use of insulin: Secondary | ICD-10-CM | POA: Diagnosis not present

## 2020-06-11 DIAGNOSIS — E7800 Pure hypercholesterolemia, unspecified: Secondary | ICD-10-CM | POA: Diagnosis not present

## 2020-06-11 DIAGNOSIS — M1990 Unspecified osteoarthritis, unspecified site: Secondary | ICD-10-CM | POA: Diagnosis not present

## 2020-06-11 DIAGNOSIS — K219 Gastro-esophageal reflux disease without esophagitis: Secondary | ICD-10-CM | POA: Diagnosis not present

## 2020-06-11 DIAGNOSIS — I340 Nonrheumatic mitral (valve) insufficiency: Secondary | ICD-10-CM | POA: Diagnosis not present

## 2020-06-11 DIAGNOSIS — C50912 Malignant neoplasm of unspecified site of left female breast: Secondary | ICD-10-CM | POA: Diagnosis not present

## 2020-06-11 DIAGNOSIS — R262 Difficulty in walking, not elsewhere classified: Secondary | ICD-10-CM | POA: Diagnosis not present

## 2020-06-11 DIAGNOSIS — I509 Heart failure, unspecified: Secondary | ICD-10-CM | POA: Diagnosis not present

## 2020-06-11 DIAGNOSIS — Z7984 Long term (current) use of oral hypoglycemic drugs: Secondary | ICD-10-CM | POA: Diagnosis not present

## 2020-06-11 DIAGNOSIS — I872 Venous insufficiency (chronic) (peripheral): Secondary | ICD-10-CM | POA: Diagnosis not present

## 2020-06-11 DIAGNOSIS — E785 Hyperlipidemia, unspecified: Secondary | ICD-10-CM | POA: Diagnosis not present

## 2020-06-11 DIAGNOSIS — I209 Angina pectoris, unspecified: Secondary | ICD-10-CM | POA: Diagnosis not present

## 2020-06-11 DIAGNOSIS — E039 Hypothyroidism, unspecified: Secondary | ICD-10-CM | POA: Diagnosis not present

## 2020-06-11 DIAGNOSIS — I4891 Unspecified atrial fibrillation: Secondary | ICD-10-CM | POA: Diagnosis not present

## 2020-06-11 DIAGNOSIS — G4700 Insomnia, unspecified: Secondary | ICD-10-CM | POA: Diagnosis not present

## 2020-06-11 DIAGNOSIS — N6082 Other benign mammary dysplasias of left breast: Secondary | ICD-10-CM | POA: Diagnosis not present

## 2020-06-11 DIAGNOSIS — E538 Deficiency of other specified B group vitamins: Secondary | ICD-10-CM | POA: Diagnosis not present

## 2020-06-11 DIAGNOSIS — Z7901 Long term (current) use of anticoagulants: Secondary | ICD-10-CM | POA: Diagnosis not present

## 2020-06-11 DIAGNOSIS — D641 Secondary sideroblastic anemia due to disease: Secondary | ICD-10-CM | POA: Diagnosis not present

## 2020-06-11 DIAGNOSIS — I4510 Unspecified right bundle-branch block: Secondary | ICD-10-CM | POA: Diagnosis not present

## 2020-06-13 ENCOUNTER — Encounter: Payer: Self-pay | Admitting: Cardiology

## 2020-06-13 ENCOUNTER — Ambulatory Visit: Payer: Medicare Other | Admitting: Cardiology

## 2020-06-13 ENCOUNTER — Other Ambulatory Visit: Payer: Self-pay

## 2020-06-13 VITALS — BP 133/55 | HR 56 | Ht 61.0 in | Wt 140.0 lb

## 2020-06-13 DIAGNOSIS — D641 Secondary sideroblastic anemia due to disease: Secondary | ICD-10-CM | POA: Diagnosis not present

## 2020-06-13 DIAGNOSIS — R0602 Shortness of breath: Secondary | ICD-10-CM

## 2020-06-13 DIAGNOSIS — K219 Gastro-esophageal reflux disease without esophagitis: Secondary | ICD-10-CM | POA: Diagnosis not present

## 2020-06-13 DIAGNOSIS — Z7984 Long term (current) use of oral hypoglycemic drugs: Secondary | ICD-10-CM | POA: Diagnosis not present

## 2020-06-13 DIAGNOSIS — E538 Deficiency of other specified B group vitamins: Secondary | ICD-10-CM | POA: Diagnosis not present

## 2020-06-13 DIAGNOSIS — Z7901 Long term (current) use of anticoagulants: Secondary | ICD-10-CM | POA: Diagnosis not present

## 2020-06-13 DIAGNOSIS — I340 Nonrheumatic mitral (valve) insufficiency: Secondary | ICD-10-CM | POA: Diagnosis not present

## 2020-06-13 DIAGNOSIS — I4510 Unspecified right bundle-branch block: Secondary | ICD-10-CM

## 2020-06-13 DIAGNOSIS — I509 Heart failure, unspecified: Secondary | ICD-10-CM | POA: Diagnosis not present

## 2020-06-13 DIAGNOSIS — I480 Paroxysmal atrial fibrillation: Secondary | ICD-10-CM | POA: Diagnosis not present

## 2020-06-13 DIAGNOSIS — I5033 Acute on chronic diastolic (congestive) heart failure: Secondary | ICD-10-CM

## 2020-06-13 DIAGNOSIS — E039 Hypothyroidism, unspecified: Secondary | ICD-10-CM | POA: Diagnosis not present

## 2020-06-13 DIAGNOSIS — I209 Angina pectoris, unspecified: Secondary | ICD-10-CM | POA: Diagnosis not present

## 2020-06-13 DIAGNOSIS — I4891 Unspecified atrial fibrillation: Secondary | ICD-10-CM | POA: Diagnosis not present

## 2020-06-13 DIAGNOSIS — E7800 Pure hypercholesterolemia, unspecified: Secondary | ICD-10-CM | POA: Diagnosis not present

## 2020-06-13 DIAGNOSIS — R262 Difficulty in walking, not elsewhere classified: Secondary | ICD-10-CM | POA: Diagnosis not present

## 2020-06-13 DIAGNOSIS — C50912 Malignant neoplasm of unspecified site of left female breast: Secondary | ICD-10-CM | POA: Diagnosis not present

## 2020-06-13 DIAGNOSIS — G4700 Insomnia, unspecified: Secondary | ICD-10-CM | POA: Diagnosis not present

## 2020-06-13 DIAGNOSIS — M1990 Unspecified osteoarthritis, unspecified site: Secondary | ICD-10-CM | POA: Diagnosis not present

## 2020-06-13 DIAGNOSIS — I872 Venous insufficiency (chronic) (peripheral): Secondary | ICD-10-CM | POA: Diagnosis not present

## 2020-06-13 DIAGNOSIS — E785 Hyperlipidemia, unspecified: Secondary | ICD-10-CM | POA: Diagnosis not present

## 2020-06-13 DIAGNOSIS — E1165 Type 2 diabetes mellitus with hyperglycemia: Secondary | ICD-10-CM | POA: Diagnosis not present

## 2020-06-13 DIAGNOSIS — N6082 Other benign mammary dysplasias of left breast: Secondary | ICD-10-CM | POA: Diagnosis not present

## 2020-06-13 DIAGNOSIS — Z794 Long term (current) use of insulin: Secondary | ICD-10-CM | POA: Diagnosis not present

## 2020-06-13 NOTE — Patient Instructions (Signed)
Medication Instructions:  Continue same medications *If you need a refill on your cardiac medications before your next appointment, please call your pharmacy*   Lab Work: Bmet,bnp,urinalysis today   Testing/Procedures: Echo   Follow-Up: At [facility], you and your health needs are our priority.  As part of our continuing mission to provide you with exceptional heart care, we have created designated Provider Care Teams.  These Care Teams include your primary Cardiologist (physician) and Advanced Practice Providers (APPs -  Physician Assistants and Nurse Practitioners) who all work together to provide you with the care you need, when you need it.  We recommend signing up for the patient portal called "MyChart".  Sign up information is provided on this After Visit Summary.  MyChart is used to connect with patients for Virtual Visits (Telemedicine).  Patients are able to view lab/test results, encounter notes, upcoming appointments, etc.  Non-urgent messages can be sent to your provider as well.   To learn more about what you can do with MyChart, go to [email or web address].    Your next appointment:  3 months   The format for your next appointment: Office   Provider:  Dr.Jordan

## 2020-06-14 ENCOUNTER — Telehealth: Payer: Self-pay | Admitting: Family Medicine

## 2020-06-14 DIAGNOSIS — N6082 Other benign mammary dysplasias of left breast: Secondary | ICD-10-CM | POA: Diagnosis not present

## 2020-06-14 DIAGNOSIS — I4510 Unspecified right bundle-branch block: Secondary | ICD-10-CM | POA: Diagnosis not present

## 2020-06-14 DIAGNOSIS — I340 Nonrheumatic mitral (valve) insufficiency: Secondary | ICD-10-CM | POA: Diagnosis not present

## 2020-06-14 DIAGNOSIS — E785 Hyperlipidemia, unspecified: Secondary | ICD-10-CM | POA: Diagnosis not present

## 2020-06-14 DIAGNOSIS — Z9181 History of falling: Secondary | ICD-10-CM

## 2020-06-14 DIAGNOSIS — Z7984 Long term (current) use of oral hypoglycemic drugs: Secondary | ICD-10-CM | POA: Diagnosis not present

## 2020-06-14 DIAGNOSIS — E1165 Type 2 diabetes mellitus with hyperglycemia: Secondary | ICD-10-CM | POA: Diagnosis not present

## 2020-06-14 DIAGNOSIS — Z794 Long term (current) use of insulin: Secondary | ICD-10-CM | POA: Diagnosis not present

## 2020-06-14 DIAGNOSIS — R262 Difficulty in walking, not elsewhere classified: Secondary | ICD-10-CM | POA: Diagnosis not present

## 2020-06-14 DIAGNOSIS — C50912 Malignant neoplasm of unspecified site of left female breast: Secondary | ICD-10-CM | POA: Diagnosis not present

## 2020-06-14 DIAGNOSIS — E7800 Pure hypercholesterolemia, unspecified: Secondary | ICD-10-CM | POA: Diagnosis not present

## 2020-06-14 DIAGNOSIS — I509 Heart failure, unspecified: Secondary | ICD-10-CM | POA: Diagnosis not present

## 2020-06-14 DIAGNOSIS — K219 Gastro-esophageal reflux disease without esophagitis: Secondary | ICD-10-CM | POA: Diagnosis not present

## 2020-06-14 DIAGNOSIS — I872 Venous insufficiency (chronic) (peripheral): Secondary | ICD-10-CM | POA: Diagnosis not present

## 2020-06-14 DIAGNOSIS — G4700 Insomnia, unspecified: Secondary | ICD-10-CM | POA: Diagnosis not present

## 2020-06-14 DIAGNOSIS — D641 Secondary sideroblastic anemia due to disease: Secondary | ICD-10-CM | POA: Diagnosis not present

## 2020-06-14 DIAGNOSIS — Z7901 Long term (current) use of anticoagulants: Secondary | ICD-10-CM | POA: Diagnosis not present

## 2020-06-14 DIAGNOSIS — I4891 Unspecified atrial fibrillation: Secondary | ICD-10-CM | POA: Diagnosis not present

## 2020-06-14 DIAGNOSIS — I209 Angina pectoris, unspecified: Secondary | ICD-10-CM | POA: Diagnosis not present

## 2020-06-14 DIAGNOSIS — E039 Hypothyroidism, unspecified: Secondary | ICD-10-CM | POA: Diagnosis not present

## 2020-06-14 DIAGNOSIS — E538 Deficiency of other specified B group vitamins: Secondary | ICD-10-CM | POA: Diagnosis not present

## 2020-06-14 DIAGNOSIS — M1990 Unspecified osteoarthritis, unspecified site: Secondary | ICD-10-CM | POA: Diagnosis not present

## 2020-06-14 LAB — URINALYSIS
Bilirubin, UA: NEGATIVE
Ketones, UA: NEGATIVE
Leukocytes,UA: NEGATIVE
Nitrite, UA: POSITIVE — AB
RBC, UA: NEGATIVE
Specific Gravity, UA: >=1.03 — AB (ref 1.005–1.030)
Urobilinogen, Ur: 1 mg/dL (ref 0.2–1.0)
pH, UA: 5.5 (ref 5.0–7.5)

## 2020-06-14 LAB — BASIC METABOLIC PANEL
BUN/Creatinine Ratio: 16 (ref 12–28)
BUN: 13 mg/dL (ref 8–27)
CO2: 23 mmol/L (ref 20–29)
Calcium: 9.4 mg/dL (ref 8.7–10.3)
Chloride: 106 mmol/L (ref 96–106)
Creatinine, Ser: 0.81 mg/dL (ref 0.57–1.00)
GFR calc Af Amer: 75 mL/min/{1.73_m2} (ref >59)
GFR calc non Af Amer: 65 mL/min/{1.73_m2} (ref >59)
Glucose: 178 mg/dL — ABNORMAL HIGH (ref 65–99)
Potassium: 5 mmol/L (ref 3.5–5.2)
Sodium: 141 mmol/L (ref 134–144)

## 2020-06-14 LAB — BRAIN NATRIURETIC PEPTIDE: BNP: 179.9 pg/mL — ABNORMAL HIGH (ref 0.0–100.0)

## 2020-06-14 NOTE — Telephone Encounter (Signed)
Please advise 

## 2020-06-14 NOTE — Telephone Encounter (Signed)
Julie Norton   She needs verbal orders for Home PT  1 time a week for 6 weeks  She's also requesting an order for a Rolator walker with a seat because she is a high fall risk.

## 2020-06-15 NOTE — Telephone Encounter (Signed)
OK to approve orders as requested.

## 2020-06-17 NOTE — Telephone Encounter (Signed)
Left detailed message on machine on confidential voicemail.  Order placed for rolling walker.

## 2020-06-17 NOTE — Addendum Note (Signed)
Addended by: Westley Hummer B on: 06/17/2020 10:18 AM   Modules accepted: Orders

## 2020-06-19 ENCOUNTER — Other Ambulatory Visit: Payer: Self-pay

## 2020-06-19 ENCOUNTER — Ambulatory Visit (INDEPENDENT_AMBULATORY_CARE_PROVIDER_SITE_OTHER): Payer: Medicare Other | Admitting: Family Medicine

## 2020-06-19 ENCOUNTER — Encounter: Payer: Self-pay | Admitting: Family Medicine

## 2020-06-19 VITALS — BP 122/50 | HR 56 | Temp 97.7°F | Ht 61.0 in | Wt 139.7 lb

## 2020-06-19 DIAGNOSIS — M79605 Pain in left leg: Secondary | ICD-10-CM | POA: Diagnosis not present

## 2020-06-19 DIAGNOSIS — E1165 Type 2 diabetes mellitus with hyperglycemia: Secondary | ICD-10-CM | POA: Diagnosis not present

## 2020-06-19 DIAGNOSIS — R600 Localized edema: Secondary | ICD-10-CM

## 2020-06-19 DIAGNOSIS — D641 Secondary sideroblastic anemia due to disease: Secondary | ICD-10-CM | POA: Diagnosis not present

## 2020-06-19 DIAGNOSIS — Z7984 Long term (current) use of oral hypoglycemic drugs: Secondary | ICD-10-CM | POA: Diagnosis not present

## 2020-06-19 DIAGNOSIS — K219 Gastro-esophageal reflux disease without esophagitis: Secondary | ICD-10-CM | POA: Diagnosis not present

## 2020-06-19 DIAGNOSIS — E538 Deficiency of other specified B group vitamins: Secondary | ICD-10-CM | POA: Diagnosis not present

## 2020-06-19 DIAGNOSIS — N6082 Other benign mammary dysplasias of left breast: Secondary | ICD-10-CM | POA: Diagnosis not present

## 2020-06-19 DIAGNOSIS — I4891 Unspecified atrial fibrillation: Secondary | ICD-10-CM | POA: Diagnosis not present

## 2020-06-19 DIAGNOSIS — I509 Heart failure, unspecified: Secondary | ICD-10-CM | POA: Diagnosis not present

## 2020-06-19 DIAGNOSIS — I340 Nonrheumatic mitral (valve) insufficiency: Secondary | ICD-10-CM | POA: Diagnosis not present

## 2020-06-19 DIAGNOSIS — G4700 Insomnia, unspecified: Secondary | ICD-10-CM | POA: Diagnosis not present

## 2020-06-19 DIAGNOSIS — Z794 Long term (current) use of insulin: Secondary | ICD-10-CM | POA: Diagnosis not present

## 2020-06-19 DIAGNOSIS — R262 Difficulty in walking, not elsewhere classified: Secondary | ICD-10-CM | POA: Diagnosis not present

## 2020-06-19 DIAGNOSIS — Z23 Encounter for immunization: Secondary | ICD-10-CM | POA: Diagnosis not present

## 2020-06-19 DIAGNOSIS — E785 Hyperlipidemia, unspecified: Secondary | ICD-10-CM | POA: Diagnosis not present

## 2020-06-19 DIAGNOSIS — M1990 Unspecified osteoarthritis, unspecified site: Secondary | ICD-10-CM | POA: Diagnosis not present

## 2020-06-19 DIAGNOSIS — E039 Hypothyroidism, unspecified: Secondary | ICD-10-CM | POA: Diagnosis not present

## 2020-06-19 DIAGNOSIS — I209 Angina pectoris, unspecified: Secondary | ICD-10-CM | POA: Diagnosis not present

## 2020-06-19 DIAGNOSIS — C50912 Malignant neoplasm of unspecified site of left female breast: Secondary | ICD-10-CM | POA: Diagnosis not present

## 2020-06-19 DIAGNOSIS — I4510 Unspecified right bundle-branch block: Secondary | ICD-10-CM | POA: Diagnosis not present

## 2020-06-19 DIAGNOSIS — Z7901 Long term (current) use of anticoagulants: Secondary | ICD-10-CM | POA: Diagnosis not present

## 2020-06-19 DIAGNOSIS — E7800 Pure hypercholesterolemia, unspecified: Secondary | ICD-10-CM | POA: Diagnosis not present

## 2020-06-19 DIAGNOSIS — I872 Venous insufficiency (chronic) (peripheral): Secondary | ICD-10-CM | POA: Diagnosis not present

## 2020-06-19 NOTE — Patient Instructions (Signed)
Continue to weigh daily and be in touch for weight gain > 3 pounds in one day or 5 pounds in one week  Consider trial of OTC Zostrix cream for left leg pain.    We could also consider Cymbalta if pain persists.

## 2020-06-19 NOTE — Progress Notes (Signed)
Established Patient Office Visit  Subjective:  Patient ID: Julie Norton, female    DOB: Aug 12, 1929  Age: 86 y.o. MRN: [id number]  CC:  Chief Complaint  Patient presents with  . Follow-up    HPI Julie Norton presents for follow-up regarding bilateral leg edema.  She also has type 2 diabetes which has been recently poorly controlled.  Most recent A1c was 9.1%.  She remains on Metformin and is on Tresiba 24 units once daily.  She has recently had some blood sugars as low as 95 and they seem to be improving slightly.  She recently saw cardiology.  She had some labs including basic metabolic panel, BNP level, and urinalysis.  No proteinuria.  BNP level stable.  Renal function and electrolytes stable.  Her edema has improved.  We started [facility] and she has responded favorably.  Her weight had gotten above 150 pounds and currently 139.  She is weighing herself daily.  She is having some left leg pain and this sounds like a burning pain worse at night and at rest.  Occasional right leg pains as well.  No claudication symptoms.   Past Medical History:  Diagnosis Date  . Abdominal adhesions   . Abdominal gas pain    suspected -- may be related to intermittent right upper quadrant discomfort radiating around her back      . Anemia   . Anginal pain ([place])   . Arthritis   . Atrial fibrillation ([place])   . Breast cancer ([place])   . Breast mass    left breast  . Cancer ([place])    left breast  . Chest pain    intermittent right upper quadrant discomfort radiating around her back      . Complication of anesthesia   . Diabetes mellitus without complication ([place])    Type II  . Dyspepsia   . Dyspnea   . GERD (gastroesophageal reflux disease)   . H/O: hysterectomy 1978  . History of cardioversion   . Hypercholesteremia   . Hypothyroidism   . Mitral insufficiency   . Personal history of radiation therapy   . Pneumonia   . PONV (postoperative nausea and vomiting)   . Right bundle branch  block (RBBB)   . Tendinitis    of the right arm and shoulder  . Torn tendon    right shoulder    Past Surgical History:  Procedure Laterality Date  . ABDOMINAL HYSTERECTOMY    . APPENDECTOMY  1978  . BLADDER REPAIR    . BREAST LUMPECTOMY Left 02/24/2011   central partial mastectomy - dr Margot Chimes  . BREAST REDUCTION SURGERY    . CARDIOVERSION N/A 10/28/2017   Procedure: CARDIOVERSION;  Surgeon: Lelon Perla, MD;  Location: [facility] ENDOSCOPY;  Service: Cardiovascular;  Laterality: N/A;  . CARDIOVERSION N/A 04/06/2019   Procedure: CARDIOVERSION;  Surgeon: [name], MD;  Location: [facility] ENDOSCOPY;  Service: Cardiovascular;  Laterality: N/A;  . CARDIOVERSION N/A 09/08/2019   Procedure: CARDIOVERSION;  Surgeon: Dorothy Spark, MD;  Location: [place];  Service: Cardiovascular;  Laterality: N/A;  . COLONOSCOPY WITH PROPOFOL N/A 03/24/2017   Procedure: COLONOSCOPY WITH PROPOFOL;  Surgeon: Wilford Corner, MD;  Location: [place];  Service: Endoscopy;  Laterality: N/A;  . ESOPHAGOGASTRODUODENOSCOPY (EGD) WITH PROPOFOL N/A 03/24/2017   Procedure: ESOPHAGOGASTRODUODENOSCOPY (EGD) WITH PROPOFOL;  Surgeon: Wilford Corner, MD;  Location: [place];  Service: Endoscopy;  Laterality: N/A;  . LIPOMA EXCISION Right   . REDUCTION MAMMAPLASTY Bilateral 1998  .  RETINAL DETACHMENT SURGERY Left   . SALPINGOOPHORECTOMY    . TEE WITHOUT CARDIOVERSION N/A 10/28/2017   Procedure: TRANSESOPHAGEAL ECHOCARDIOGRAM (TEE);  Surgeon: Lelon Perla, MD;  Location: [facility] ENDOSCOPY;  Service: Cardiovascular;  Laterality: N/A;  . [place]   at 86 years of age    Family History  Problem Relation Age of Onset  . Pneumonia Father   . Heart attack Mother   . Hypertension Mother   . Stroke Mother   . Diabetes Mother   . Aortic aneurysm Mother        abdominal aortic aneurysm  . Cancer Mother        bladder  . Aortic aneurysm Sister        abdominal aortic  aneurysm    Social History   Socioeconomic History  . Marital status: Married    Spouse name: Not on file  . Number of children: Not on file  . Years of education: Not on file  . Highest education level: Not on file  Occupational History  . Not on file  Tobacco Use  . Smoking status: Never Smoker  . Smokeless tobacco: Never Used  Vaping Use  . Vaping Use: Never used  Substance and Sexual Activity  . Alcohol use: No  . Drug use: No  . Sexual activity: Not on file  Other Topics Concern  . Not on file  Social History Narrative  . Not on file   Social Determinants of Health   Financial Resource Strain: Medium Risk  . Difficulty of Paying Living Expenses: Somewhat hard  Food Insecurity: Not on file  Transportation Needs: No Transportation Needs  . Lack of Transportation (Medical): No  . Lack of Transportation (Non-Medical): No  Physical Activity: Not on file  Stress: Not on file  Social Connections: Not on file  Intimate Partner Violence: Not on file    Outpatient Medications Prior to Visit  Medication Sig Dispense Refill  . acetaminophen (TYLENOL) 500 MG tablet Take 1,000 mg by mouth every 6 (six) hours as needed for mild pain or moderate pain.     Marland Kitchen amiodarone (PACERONE) 200 MG tablet Take 100 mg by mouth at bedtime.  90 tablet 3  . CALCIUM PO Take 1 tablet by mouth daily.    . Cholecalciferol (CVS D3) 125 MCG (5000 UT) capsule Take 1 capsule (5,000 Units total) by mouth daily. 90 capsule 3  . ELIQUIS 5 MG TABS tablet TAKE 1 TABLET BY MOUTH TWICE A DAY 180 tablet 1  . glucose blood (ONE TOUCH ULTRA TEST) test strip Check once daily. E11.40 100 each 11  . Insulin Pen Needle 29G X 5MM MISC Use once daily 100 each 3  . levothyroxine (SYNTHROID) 50 MCG tablet TAKE 1 TABLET BY MOUTH  DAILY 90 tablet 3  . loratadine (CLARITIN) 10 MG tablet Take 10 mg by mouth daily as needed for allergies.    . metFORMIN (GLUCOPHAGE-XR) 500 MG 24 hr tablet TAKE 1 TABLET BY MOUTH  TWICE  DAILY 180 tablet 1  . metoprolol succinate (TOPROL-XL) 25 MG 24 hr tablet TAKE 1 TABLET BY MOUTH EVERY DAY 90 tablet 3  . omeprazole (PRILOSEC) 20 MG capsule TAKE 1 CAPSULE BY MOUTH  TWICE DAILY 180 capsule 3  . ONE TOUCH ULTRA TEST test strip CHECK ONCE A DAY 50 each 3  . potassium chloride (KLOR-CON) 10 MEQ tablet TAKE 1 TABLET BY MOUTH EVERY DAY 30 tablet 1  . torsemide (DEMADEX) 10 MG tablet  TAKE 1 TABLET BY MOUTH EVERY DAY 30 tablet 1  . traZODone (DESYREL) 50 MG tablet TAKE 1/2 TO 1 TABLET BY MOUTH AT BEDTIME AS NEEDED FOR SLEEP 90 tablet 1  . TRESIBA FLEXTOUCH 100 UNIT/ML FlexTouch Pen INJECT 24 UNITS TOTAL INTO THE SKIN DAILY AT 10PM (Patient taking differently: Inject 18 Units into the skin at bedtime.) 15 pen 5  . triamcinolone cream (KENALOG) 0.1 % Apply 1 application topically 2 (two) times daily. 30 g 2  . vitamin B-12 (CYANOCOBALAMIN) 1000 MCG tablet Take 1 tablet (1,000 mcg total) by mouth daily. 90 tablet 3   No facility-administered medications prior to visit.    Allergies  Allergen Reactions  . Bactrim Nausea And Vomiting  . Dilaudid [Hydromorphone Hcl] Other (See Comments)    Unknown  . Hydrocodone Nausea And Vomiting  . Invokana [Canagliflozin] Itching and Other (See Comments)    Yeast Infections   . Macrodantin Nausea And Vomiting    ROS Review of Systems  Constitutional: Positive for fatigue. Negative for chills and fever.  Respiratory: Negative for cough.   Cardiovascular: Positive for leg swelling. Negative for chest pain.  Neurological: Negative for dizziness.  Psychiatric/Behavioral: Negative for confusion.      Objective:    Physical Exam Vitals reviewed.  Constitutional:      Appearance: Normal appearance.  Cardiovascular:     Rate and Rhythm: Normal rate and regular rhythm.  Pulmonary:     Effort: Pulmonary effort is normal.     Breath sounds: Normal breath sounds. No wheezing or rales.  Musculoskeletal:     Comments: Still has trace  pitting edema both legs- but overall improved.  No ulcers.  Neurological:     Mental Status: She is alert.     BP (!) 122/50 (BP Location: Left Arm, Patient Position: Sitting, Cuff Size: Normal)   Pulse (!) 56   Temp 97.7 F (36.5 C) (Oral)   Ht 5\' 1"  (1.549 m)   Wt 139 lb 11.2 oz (63.4 kg)   SpO2 95%   BMI 26.40 kg/m  Wt Readings from Last 3 Encounters:  06/19/20 139 lb 11.2 oz (63.4 kg)  06/13/20 140 lb (63.5 kg)  05/28/20 145 lb 3.2 oz (65.9 kg)     Health Maintenance Due  Topic Date Due  . URINE MICROALBUMIN  04/07/2014  . FOOT EXAM  08/29/2015  . PNA vac Low Risk Adult (2 of 2 - PPSV23) 03/03/2017  . OPHTHALMOLOGY EXAM  07/30/2019  . COVID-19 Vaccine (2 - Moderna risk 4-dose series) 12/01/2019  . INFLUENZA VACCINE  02/04/2020    There are no preventive care reminders to display for this patient.  Lab Results  Component Value Date   TSH 2.08 04/22/2020   Lab Results  Component Value Date   WBC 4.7 02/19/2020   HGB 9.4 (L) 02/19/2020   HCT 31.5 (L) 02/19/2020   MCV 80.8 02/19/2020   PLT 158 02/19/2020   Lab Results  Component Value Date   NA 141 06/13/2020   K 5.0 06/13/2020   CHLORIDE 111 (H) 04/03/2014   CO2 23 06/13/2020   GLUCOSE 178 (H) 06/13/2020   BUN 13 06/13/2020   CREATININE 0.81 06/13/2020   BILITOT 0.4 12/11/2019   ALKPHOS 87 12/11/2019   AST 39 12/11/2019   ALT 32 12/11/2019   PROT 6.4 12/11/2019   ALBUMIN 4.1 12/11/2019   CALCIUM 9.4 06/13/2020   ANIONGAP 8 02/19/2020   GFR 70.88 06/23/2019   Lab Results  Component Value  Date   CHOL 94 06/23/2019   Lab Results  Component Value Date   HDL 35.20 (L) 06/23/2019   Lab Results  Component Value Date   LDLCALC 45 06/23/2019   Lab Results  Component Value Date   TRIG 70.0 06/23/2019   Lab Results  Component Value Date   CHOLHDL 3 06/23/2019   Lab Results  Component Value Date   HGBA1C 9.1 (H) 04/22/2020      Assessment & Plan:   #1 type 2 diabetes.  Improving by  recent fasting readings.  She had A1c of 9.1% back in October.  We will plan to repeat A1c at 43-month follow-up.  Continue close monitoring.  We discussed possible addition of additional medication such as Januvia but at this point she wishes to wait She is still a candidate for Metformin with GFR over 30 She did not tolerate SGLT-2 class secondary to yeast vaginitis.  #2 bilateral leg edema improved -Continue Demadex 10 mg daily -Continue daily weights and be in touch for weight gain greater than 3 pounds in 1 day or 5 pounds in 1 week  #3 left leg pain.  Suspect neuropathic.  Try to avoid gabapentin and Lyrica because of edema issues.  Could consider trial of Cymbalta but at this point she will observe symptoms are relatively mild  No orders of the defined types were placed in this encounter.   Follow-up: Return in about 2 months (around 08/20/2020).    [name], MD

## 2020-06-20 DIAGNOSIS — G4700 Insomnia, unspecified: Secondary | ICD-10-CM | POA: Diagnosis not present

## 2020-06-20 DIAGNOSIS — Z794 Long term (current) use of insulin: Secondary | ICD-10-CM | POA: Diagnosis not present

## 2020-06-20 DIAGNOSIS — D641 Secondary sideroblastic anemia due to disease: Secondary | ICD-10-CM | POA: Diagnosis not present

## 2020-06-20 DIAGNOSIS — E039 Hypothyroidism, unspecified: Secondary | ICD-10-CM | POA: Diagnosis not present

## 2020-06-20 DIAGNOSIS — E785 Hyperlipidemia, unspecified: Secondary | ICD-10-CM | POA: Diagnosis not present

## 2020-06-20 DIAGNOSIS — I4891 Unspecified atrial fibrillation: Secondary | ICD-10-CM | POA: Diagnosis not present

## 2020-06-20 DIAGNOSIS — K219 Gastro-esophageal reflux disease without esophagitis: Secondary | ICD-10-CM | POA: Diagnosis not present

## 2020-06-20 DIAGNOSIS — Z7901 Long term (current) use of anticoagulants: Secondary | ICD-10-CM | POA: Diagnosis not present

## 2020-06-20 DIAGNOSIS — I509 Heart failure, unspecified: Secondary | ICD-10-CM | POA: Diagnosis not present

## 2020-06-20 DIAGNOSIS — I209 Angina pectoris, unspecified: Secondary | ICD-10-CM | POA: Diagnosis not present

## 2020-06-20 DIAGNOSIS — I872 Venous insufficiency (chronic) (peripheral): Secondary | ICD-10-CM | POA: Diagnosis not present

## 2020-06-20 DIAGNOSIS — N6082 Other benign mammary dysplasias of left breast: Secondary | ICD-10-CM | POA: Diagnosis not present

## 2020-06-20 DIAGNOSIS — I340 Nonrheumatic mitral (valve) insufficiency: Secondary | ICD-10-CM | POA: Diagnosis not present

## 2020-06-20 DIAGNOSIS — M1990 Unspecified osteoarthritis, unspecified site: Secondary | ICD-10-CM | POA: Diagnosis not present

## 2020-06-20 DIAGNOSIS — C50912 Malignant neoplasm of unspecified site of left female breast: Secondary | ICD-10-CM | POA: Diagnosis not present

## 2020-06-20 DIAGNOSIS — E7800 Pure hypercholesterolemia, unspecified: Secondary | ICD-10-CM | POA: Diagnosis not present

## 2020-06-20 DIAGNOSIS — Z7984 Long term (current) use of oral hypoglycemic drugs: Secondary | ICD-10-CM | POA: Diagnosis not present

## 2020-06-20 DIAGNOSIS — E538 Deficiency of other specified B group vitamins: Secondary | ICD-10-CM | POA: Diagnosis not present

## 2020-06-20 DIAGNOSIS — E1165 Type 2 diabetes mellitus with hyperglycemia: Secondary | ICD-10-CM | POA: Diagnosis not present

## 2020-06-20 DIAGNOSIS — I4510 Unspecified right bundle-branch block: Secondary | ICD-10-CM | POA: Diagnosis not present

## 2020-06-20 DIAGNOSIS — R262 Difficulty in walking, not elsewhere classified: Secondary | ICD-10-CM | POA: Diagnosis not present

## 2020-06-21 ENCOUNTER — Other Ambulatory Visit: Payer: Self-pay | Admitting: Family Medicine

## 2020-06-24 ENCOUNTER — Ambulatory Visit (HOSPITAL_COMMUNITY)
Admission: RE | Admit: 2020-06-24 | Discharge: 2020-06-24 | Disposition: A | Discharge status: Home / Self Care | Payer: Medicare Other | Source: Ambulatory Visit | Attending: Cardiology | Admitting: Cardiology

## 2020-06-24 ENCOUNTER — Other Ambulatory Visit: Payer: Self-pay

## 2020-06-24 ENCOUNTER — Encounter: Payer: Self-pay | Admitting: Family Medicine

## 2020-06-24 DIAGNOSIS — R0602 Shortness of breath: Secondary | ICD-10-CM | POA: Insufficient documentation

## 2020-06-24 DIAGNOSIS — E785 Hyperlipidemia, unspecified: Secondary | ICD-10-CM | POA: Diagnosis not present

## 2020-06-24 DIAGNOSIS — K219 Gastro-esophageal reflux disease without esophagitis: Secondary | ICD-10-CM | POA: Insufficient documentation

## 2020-06-24 DIAGNOSIS — Z853 Personal history of malignant neoplasm of breast: Secondary | ICD-10-CM | POA: Insufficient documentation

## 2020-06-24 DIAGNOSIS — I77819 Aortic ectasia, unspecified site: Secondary | ICD-10-CM | POA: Diagnosis not present

## 2020-06-24 DIAGNOSIS — I480 Paroxysmal atrial fibrillation: Secondary | ICD-10-CM | POA: Diagnosis not present

## 2020-06-24 DIAGNOSIS — E119 Type 2 diabetes mellitus without complications: Secondary | ICD-10-CM | POA: Diagnosis not present

## 2020-06-24 DIAGNOSIS — I4510 Unspecified right bundle-branch block: Secondary | ICD-10-CM | POA: Insufficient documentation

## 2020-06-24 DIAGNOSIS — I071 Rheumatic tricuspid insufficiency: Secondary | ICD-10-CM | POA: Diagnosis not present

## 2020-06-24 DIAGNOSIS — I5033 Acute on chronic diastolic (congestive) heart failure: Secondary | ICD-10-CM | POA: Insufficient documentation

## 2020-06-24 LAB — ECHOCARDIOGRAM COMPLETE
Area-P 1/2: 3.17 cm2
S' Lateral: 3.2 cm

## 2020-06-24 NOTE — Progress Notes (Signed)
  Echocardiogram 2D Echocardiogram with 3D has been performed.  Darlina Sicilian M 06/24/2020, 10:02 AM

## 2020-06-25 DIAGNOSIS — E039 Hypothyroidism, unspecified: Secondary | ICD-10-CM | POA: Diagnosis not present

## 2020-06-25 DIAGNOSIS — C50912 Malignant neoplasm of unspecified site of left female breast: Secondary | ICD-10-CM | POA: Diagnosis not present

## 2020-06-25 DIAGNOSIS — G4700 Insomnia, unspecified: Secondary | ICD-10-CM | POA: Diagnosis not present

## 2020-06-25 DIAGNOSIS — E1165 Type 2 diabetes mellitus with hyperglycemia: Secondary | ICD-10-CM | POA: Diagnosis not present

## 2020-06-25 DIAGNOSIS — Z7901 Long term (current) use of anticoagulants: Secondary | ICD-10-CM | POA: Diagnosis not present

## 2020-06-25 DIAGNOSIS — M1990 Unspecified osteoarthritis, unspecified site: Secondary | ICD-10-CM | POA: Diagnosis not present

## 2020-06-25 DIAGNOSIS — E785 Hyperlipidemia, unspecified: Secondary | ICD-10-CM | POA: Diagnosis not present

## 2020-06-25 DIAGNOSIS — I509 Heart failure, unspecified: Secondary | ICD-10-CM | POA: Diagnosis not present

## 2020-06-25 DIAGNOSIS — N6082 Other benign mammary dysplasias of left breast: Secondary | ICD-10-CM | POA: Diagnosis not present

## 2020-06-25 DIAGNOSIS — I872 Venous insufficiency (chronic) (peripheral): Secondary | ICD-10-CM | POA: Diagnosis not present

## 2020-06-25 DIAGNOSIS — I340 Nonrheumatic mitral (valve) insufficiency: Secondary | ICD-10-CM | POA: Diagnosis not present

## 2020-06-25 DIAGNOSIS — Z794 Long term (current) use of insulin: Secondary | ICD-10-CM | POA: Diagnosis not present

## 2020-06-25 DIAGNOSIS — E7800 Pure hypercholesterolemia, unspecified: Secondary | ICD-10-CM | POA: Diagnosis not present

## 2020-06-25 DIAGNOSIS — R262 Difficulty in walking, not elsewhere classified: Secondary | ICD-10-CM | POA: Diagnosis not present

## 2020-06-25 DIAGNOSIS — I4510 Unspecified right bundle-branch block: Secondary | ICD-10-CM | POA: Diagnosis not present

## 2020-06-25 DIAGNOSIS — K219 Gastro-esophageal reflux disease without esophagitis: Secondary | ICD-10-CM | POA: Diagnosis not present

## 2020-06-25 DIAGNOSIS — D641 Secondary sideroblastic anemia due to disease: Secondary | ICD-10-CM | POA: Diagnosis not present

## 2020-06-25 DIAGNOSIS — I209 Angina pectoris, unspecified: Secondary | ICD-10-CM | POA: Diagnosis not present

## 2020-06-25 DIAGNOSIS — I4891 Unspecified atrial fibrillation: Secondary | ICD-10-CM | POA: Diagnosis not present

## 2020-06-25 DIAGNOSIS — Z7984 Long term (current) use of oral hypoglycemic drugs: Secondary | ICD-10-CM | POA: Diagnosis not present

## 2020-06-25 DIAGNOSIS — E538 Deficiency of other specified B group vitamins: Secondary | ICD-10-CM | POA: Diagnosis not present

## 2020-07-01 ENCOUNTER — Encounter: Payer: Self-pay | Admitting: Cardiology

## 2020-07-01 ENCOUNTER — Telehealth: Payer: Self-pay

## 2020-07-01 NOTE — Telephone Encounter (Signed)
Spoke to patient's daughter Julie Norton.She wanted to ask Dr.Jordan if her mother could stop Amiodarone.Stated she is taking 100 mg daily.Stated amiodarone makes her feel awful.Stated she is miserable,poor quality of life.Spoke to Dr.Jordan he advised she can stop amiodarone.Stated she could go back in Afib.Julie Norton stated she will talk to her mother and let her make the decision.

## 2020-07-01 NOTE — Telephone Encounter (Signed)
Error

## 2020-07-02 DIAGNOSIS — R262 Difficulty in walking, not elsewhere classified: Secondary | ICD-10-CM | POA: Diagnosis not present

## 2020-07-02 DIAGNOSIS — I209 Angina pectoris, unspecified: Secondary | ICD-10-CM | POA: Diagnosis not present

## 2020-07-02 DIAGNOSIS — C50912 Malignant neoplasm of unspecified site of left female breast: Secondary | ICD-10-CM | POA: Diagnosis not present

## 2020-07-02 DIAGNOSIS — E538 Deficiency of other specified B group vitamins: Secondary | ICD-10-CM | POA: Diagnosis not present

## 2020-07-02 DIAGNOSIS — I340 Nonrheumatic mitral (valve) insufficiency: Secondary | ICD-10-CM | POA: Diagnosis not present

## 2020-07-02 DIAGNOSIS — Z794 Long term (current) use of insulin: Secondary | ICD-10-CM | POA: Diagnosis not present

## 2020-07-02 DIAGNOSIS — E785 Hyperlipidemia, unspecified: Secondary | ICD-10-CM | POA: Diagnosis not present

## 2020-07-02 DIAGNOSIS — E039 Hypothyroidism, unspecified: Secondary | ICD-10-CM | POA: Diagnosis not present

## 2020-07-02 DIAGNOSIS — I4891 Unspecified atrial fibrillation: Secondary | ICD-10-CM | POA: Diagnosis not present

## 2020-07-02 DIAGNOSIS — D641 Secondary sideroblastic anemia due to disease: Secondary | ICD-10-CM | POA: Diagnosis not present

## 2020-07-02 DIAGNOSIS — E7800 Pure hypercholesterolemia, unspecified: Secondary | ICD-10-CM | POA: Diagnosis not present

## 2020-07-02 DIAGNOSIS — M1990 Unspecified osteoarthritis, unspecified site: Secondary | ICD-10-CM | POA: Diagnosis not present

## 2020-07-02 DIAGNOSIS — Z7901 Long term (current) use of anticoagulants: Secondary | ICD-10-CM | POA: Diagnosis not present

## 2020-07-02 DIAGNOSIS — Z7984 Long term (current) use of oral hypoglycemic drugs: Secondary | ICD-10-CM | POA: Diagnosis not present

## 2020-07-02 DIAGNOSIS — G4700 Insomnia, unspecified: Secondary | ICD-10-CM | POA: Diagnosis not present

## 2020-07-02 DIAGNOSIS — K219 Gastro-esophageal reflux disease without esophagitis: Secondary | ICD-10-CM | POA: Diagnosis not present

## 2020-07-02 DIAGNOSIS — I509 Heart failure, unspecified: Secondary | ICD-10-CM | POA: Diagnosis not present

## 2020-07-02 DIAGNOSIS — E1165 Type 2 diabetes mellitus with hyperglycemia: Secondary | ICD-10-CM | POA: Diagnosis not present

## 2020-07-02 DIAGNOSIS — I4510 Unspecified right bundle-branch block: Secondary | ICD-10-CM | POA: Diagnosis not present

## 2020-07-02 DIAGNOSIS — N6082 Other benign mammary dysplasias of left breast: Secondary | ICD-10-CM | POA: Diagnosis not present

## 2020-07-02 DIAGNOSIS — I872 Venous insufficiency (chronic) (peripheral): Secondary | ICD-10-CM | POA: Diagnosis not present

## 2020-07-03 DIAGNOSIS — Z7984 Long term (current) use of oral hypoglycemic drugs: Secondary | ICD-10-CM | POA: Diagnosis not present

## 2020-07-03 DIAGNOSIS — C50912 Malignant neoplasm of unspecified site of left female breast: Secondary | ICD-10-CM | POA: Diagnosis not present

## 2020-07-03 DIAGNOSIS — I340 Nonrheumatic mitral (valve) insufficiency: Secondary | ICD-10-CM | POA: Diagnosis not present

## 2020-07-03 DIAGNOSIS — I209 Angina pectoris, unspecified: Secondary | ICD-10-CM | POA: Diagnosis not present

## 2020-07-03 DIAGNOSIS — D641 Secondary sideroblastic anemia due to disease: Secondary | ICD-10-CM | POA: Diagnosis not present

## 2020-07-03 DIAGNOSIS — N6082 Other benign mammary dysplasias of left breast: Secondary | ICD-10-CM | POA: Diagnosis not present

## 2020-07-03 DIAGNOSIS — E039 Hypothyroidism, unspecified: Secondary | ICD-10-CM | POA: Diagnosis not present

## 2020-07-03 DIAGNOSIS — Z794 Long term (current) use of insulin: Secondary | ICD-10-CM | POA: Diagnosis not present

## 2020-07-03 DIAGNOSIS — G4700 Insomnia, unspecified: Secondary | ICD-10-CM | POA: Diagnosis not present

## 2020-07-03 DIAGNOSIS — E7800 Pure hypercholesterolemia, unspecified: Secondary | ICD-10-CM | POA: Diagnosis not present

## 2020-07-03 DIAGNOSIS — I4510 Unspecified right bundle-branch block: Secondary | ICD-10-CM | POA: Diagnosis not present

## 2020-07-03 DIAGNOSIS — E538 Deficiency of other specified B group vitamins: Secondary | ICD-10-CM | POA: Diagnosis not present

## 2020-07-03 DIAGNOSIS — I872 Venous insufficiency (chronic) (peripheral): Secondary | ICD-10-CM | POA: Diagnosis not present

## 2020-07-03 DIAGNOSIS — Z7901 Long term (current) use of anticoagulants: Secondary | ICD-10-CM | POA: Diagnosis not present

## 2020-07-03 DIAGNOSIS — E785 Hyperlipidemia, unspecified: Secondary | ICD-10-CM | POA: Diagnosis not present

## 2020-07-03 DIAGNOSIS — K219 Gastro-esophageal reflux disease without esophagitis: Secondary | ICD-10-CM | POA: Diagnosis not present

## 2020-07-03 DIAGNOSIS — M1990 Unspecified osteoarthritis, unspecified site: Secondary | ICD-10-CM | POA: Diagnosis not present

## 2020-07-03 DIAGNOSIS — E1165 Type 2 diabetes mellitus with hyperglycemia: Secondary | ICD-10-CM | POA: Diagnosis not present

## 2020-07-03 DIAGNOSIS — R262 Difficulty in walking, not elsewhere classified: Secondary | ICD-10-CM | POA: Diagnosis not present

## 2020-07-03 DIAGNOSIS — I509 Heart failure, unspecified: Secondary | ICD-10-CM | POA: Diagnosis not present

## 2020-07-03 DIAGNOSIS — I4891 Unspecified atrial fibrillation: Secondary | ICD-10-CM | POA: Diagnosis not present

## 2020-07-10 DIAGNOSIS — C50912 Malignant neoplasm of unspecified site of left female breast: Secondary | ICD-10-CM | POA: Diagnosis not present

## 2020-07-10 DIAGNOSIS — N6082 Other benign mammary dysplasias of left breast: Secondary | ICD-10-CM | POA: Diagnosis not present

## 2020-07-10 DIAGNOSIS — E7800 Pure hypercholesterolemia, unspecified: Secondary | ICD-10-CM | POA: Diagnosis not present

## 2020-07-10 DIAGNOSIS — E538 Deficiency of other specified B group vitamins: Secondary | ICD-10-CM | POA: Diagnosis not present

## 2020-07-10 DIAGNOSIS — R262 Difficulty in walking, not elsewhere classified: Secondary | ICD-10-CM | POA: Diagnosis not present

## 2020-07-10 DIAGNOSIS — E785 Hyperlipidemia, unspecified: Secondary | ICD-10-CM | POA: Diagnosis not present

## 2020-07-10 DIAGNOSIS — Z794 Long term (current) use of insulin: Secondary | ICD-10-CM | POA: Diagnosis not present

## 2020-07-10 DIAGNOSIS — I4510 Unspecified right bundle-branch block: Secondary | ICD-10-CM | POA: Diagnosis not present

## 2020-07-10 DIAGNOSIS — E1165 Type 2 diabetes mellitus with hyperglycemia: Secondary | ICD-10-CM | POA: Diagnosis not present

## 2020-07-10 DIAGNOSIS — D641 Secondary sideroblastic anemia due to disease: Secondary | ICD-10-CM | POA: Diagnosis not present

## 2020-07-10 DIAGNOSIS — E039 Hypothyroidism, unspecified: Secondary | ICD-10-CM | POA: Diagnosis not present

## 2020-07-10 DIAGNOSIS — M1990 Unspecified osteoarthritis, unspecified site: Secondary | ICD-10-CM | POA: Diagnosis not present

## 2020-07-10 DIAGNOSIS — G4700 Insomnia, unspecified: Secondary | ICD-10-CM | POA: Diagnosis not present

## 2020-07-10 DIAGNOSIS — I340 Nonrheumatic mitral (valve) insufficiency: Secondary | ICD-10-CM | POA: Diagnosis not present

## 2020-07-10 DIAGNOSIS — Z7901 Long term (current) use of anticoagulants: Secondary | ICD-10-CM | POA: Diagnosis not present

## 2020-07-10 DIAGNOSIS — I209 Angina pectoris, unspecified: Secondary | ICD-10-CM | POA: Diagnosis not present

## 2020-07-10 DIAGNOSIS — K219 Gastro-esophageal reflux disease without esophagitis: Secondary | ICD-10-CM | POA: Diagnosis not present

## 2020-07-10 DIAGNOSIS — Z7984 Long term (current) use of oral hypoglycemic drugs: Secondary | ICD-10-CM | POA: Diagnosis not present

## 2020-07-10 DIAGNOSIS — I4891 Unspecified atrial fibrillation: Secondary | ICD-10-CM | POA: Diagnosis not present

## 2020-07-10 DIAGNOSIS — I872 Venous insufficiency (chronic) (peripheral): Secondary | ICD-10-CM | POA: Diagnosis not present

## 2020-07-10 DIAGNOSIS — I509 Heart failure, unspecified: Secondary | ICD-10-CM | POA: Diagnosis not present

## 2020-07-17 ENCOUNTER — Other Ambulatory Visit: Payer: Self-pay | Admitting: Family Medicine

## 2020-07-19 DIAGNOSIS — Z794 Long term (current) use of insulin: Secondary | ICD-10-CM | POA: Diagnosis not present

## 2020-07-19 DIAGNOSIS — Z7901 Long term (current) use of anticoagulants: Secondary | ICD-10-CM | POA: Diagnosis not present

## 2020-07-19 DIAGNOSIS — R262 Difficulty in walking, not elsewhere classified: Secondary | ICD-10-CM | POA: Diagnosis not present

## 2020-07-19 DIAGNOSIS — K219 Gastro-esophageal reflux disease without esophagitis: Secondary | ICD-10-CM | POA: Diagnosis not present

## 2020-07-19 DIAGNOSIS — M1990 Unspecified osteoarthritis, unspecified site: Secondary | ICD-10-CM | POA: Diagnosis not present

## 2020-07-19 DIAGNOSIS — E785 Hyperlipidemia, unspecified: Secondary | ICD-10-CM | POA: Diagnosis not present

## 2020-07-19 DIAGNOSIS — D641 Secondary sideroblastic anemia due to disease: Secondary | ICD-10-CM | POA: Diagnosis not present

## 2020-07-19 DIAGNOSIS — I872 Venous insufficiency (chronic) (peripheral): Secondary | ICD-10-CM | POA: Diagnosis not present

## 2020-07-19 DIAGNOSIS — E1165 Type 2 diabetes mellitus with hyperglycemia: Secondary | ICD-10-CM | POA: Diagnosis not present

## 2020-07-19 DIAGNOSIS — E538 Deficiency of other specified B group vitamins: Secondary | ICD-10-CM | POA: Diagnosis not present

## 2020-07-19 DIAGNOSIS — N6082 Other benign mammary dysplasias of left breast: Secondary | ICD-10-CM | POA: Diagnosis not present

## 2020-07-19 DIAGNOSIS — E039 Hypothyroidism, unspecified: Secondary | ICD-10-CM | POA: Diagnosis not present

## 2020-07-19 DIAGNOSIS — I209 Angina pectoris, unspecified: Secondary | ICD-10-CM | POA: Diagnosis not present

## 2020-07-19 DIAGNOSIS — I509 Heart failure, unspecified: Secondary | ICD-10-CM | POA: Diagnosis not present

## 2020-07-19 DIAGNOSIS — C50912 Malignant neoplasm of unspecified site of left female breast: Secondary | ICD-10-CM | POA: Diagnosis not present

## 2020-07-19 DIAGNOSIS — I4891 Unspecified atrial fibrillation: Secondary | ICD-10-CM | POA: Diagnosis not present

## 2020-07-19 DIAGNOSIS — E7800 Pure hypercholesterolemia, unspecified: Secondary | ICD-10-CM | POA: Diagnosis not present

## 2020-07-19 DIAGNOSIS — I340 Nonrheumatic mitral (valve) insufficiency: Secondary | ICD-10-CM | POA: Diagnosis not present

## 2020-07-19 DIAGNOSIS — I4510 Unspecified right bundle-branch block: Secondary | ICD-10-CM | POA: Diagnosis not present

## 2020-07-19 DIAGNOSIS — G4700 Insomnia, unspecified: Secondary | ICD-10-CM | POA: Diagnosis not present

## 2020-07-19 DIAGNOSIS — Z7984 Long term (current) use of oral hypoglycemic drugs: Secondary | ICD-10-CM | POA: Diagnosis not present

## 2020-07-22 ENCOUNTER — Ambulatory Visit: Payer: Medicare Other | Admitting: Pharmacist

## 2020-07-22 DIAGNOSIS — E7800 Pure hypercholesterolemia, unspecified: Secondary | ICD-10-CM

## 2020-07-22 DIAGNOSIS — E039 Hypothyroidism, unspecified: Secondary | ICD-10-CM

## 2020-07-22 NOTE — Chronic Care Management (AMB) (Signed)
Chronic Care Management Pharmacy  Name: Julie Norton  MRN: [id number] DOB: 05/12/1930  Initial Questions: 1. Have you seen any other providers since your last visit? NA 2. Any changes in your medicines or health? No   Chief Complaint/ HPI  Julie Norton,  87 y.o. , female presents for their Follow-Up CCM visit with the clinical pharmacist via telephone due to COVID-19 Pandemic.  Patient presented with spouse. Patient states she cannot leave him alone since he was expected to pass away in January, but health improved. He still presents with memory issues. She states biggest concern is cost for [place] and Eliquis.   PCP : Eulas Post, MD  Their chronic conditions include: Afib, DM, HLD, GERD, Hypothyroidism, Diastolic heart failure, Osteoarthritis, low vitamin B12 level  Office Visits: 06/19/20 [name], MD: Patient presented for edema and DM follow up. Follow up in 2 months.  05/28/20 [name], MD: Patient presented for edema follow up. Continue torsemide and potassium. Recheck BMP today.  05/14/20 [name], MD: Patient presented for DM and edema follow up. Referral placed for home health. Stopped furosemide and prescribed torsemide 10 mg daily and potassium chloride 10 mEq daily.  04/22/20 [name], MD: Patient presented for leg swelling. A1C 9.1 and increased Tresiba to 18 units daily and provided titration schedule. BNP slightly elevated.  12/06/2019- [name], MD- Patient presented for office visit for 2 week history of reported pain of right lower quadrant abdomen with some radiation toward back. Broad differential. Check urine dipstick and CBC. Patient to consider Miralax for constipation. Patient to follow up with worsening pain.   Consult Visit: 06/13/20 Peter Martinique, MD: Patient presented for follow up for Afib. No changes made.  12/15/2019- Cardiology- Peter Martinique, MD- Patient presented for office visit for follow up of atrial  fibrillation. Patient maintaining NSR. Patient noted nausea improved with reduction of amiodarone to 200mg  daily.  Patient to follow up in 6 months.   Medications: Outpatient Encounter Medications as of 07/22/2020  Medication Sig Note  . acetaminophen (TYLENOL) 500 MG tablet Take 1,000 mg by mouth every 6 (six) hours as needed for mild pain or moderate pain.    Marland Kitchen CALCIUM PO Take 1 tablet by mouth daily.   . Cholecalciferol ([facility] D3) 125 MCG (5000 UT) capsule Take 1 capsule (5,000 Units total) by mouth daily.   Marland Kitchen ELIQUIS 5 MG TABS tablet TAKE 1 TABLET BY MOUTH TWICE A DAY   . glucose blood (ONE TOUCH ULTRA TEST) test strip Check once daily. E11.40   . Insulin Pen Needle 29G X 5MM MISC Use once daily   . levothyroxine (SYNTHROID) 50 MCG tablet TAKE 1 TABLET BY MOUTH  DAILY   . loratadine (CLARITIN) 10 MG tablet Take 10 mg by mouth daily as needed for allergies.   . metFORMIN (GLUCOPHAGE-XR) 500 MG 24 hr tablet TAKE 1 TABLET BY MOUTH  TWICE DAILY   . metoprolol succinate (TOPROL-XL) 25 MG 24 hr tablet TAKE 1 TABLET BY MOUTH EVERY DAY   . omeprazole (PRILOSEC) 20 MG capsule TAKE 1 CAPSULE BY MOUTH  TWICE DAILY   . ONE TOUCH ULTRA TEST test strip CHECK ONCE A DAY   . potassium chloride (KLOR-CON) 10 MEQ tablet TAKE 1 TABLET BY MOUTH EVERY DAY   . torsemide (DEMADEX) 10 MG tablet TAKE 1 TABLET BY MOUTH EVERY DAY   . traZODone (DESYREL) 50 MG tablet TAKE 1/2 TO 1 TABLET BY MOUTH AT BEDTIME AS NEEDED FOR SLEEP   .  TRESIBA FLEXTOUCH 100 UNIT/ML FlexTouch Pen INJECT 24 UNITS TOTAL INTO THE SKIN DAILY AT 10PM (Patient taking differently: Inject 18 Units into the skin at bedtime.)   . triamcinolone cream (KENALOG) 0.1 % Apply 1 application topically 2 (two) times daily.   . vitamin B-12 (CYANOCOBALAMIN) 1000 MCG tablet Take 1 tablet (1,000 mcg total) by mouth daily.   . [DISCONTINUED] amiodarone (PACERONE) 200 MG tablet Take 100 mg by mouth at bedtime.  04/22/2020: Taking 100mg  once a day    No  facility-administered encounter medications on file as of 07/22/2020.   Patient reports home health is coming out to look at her legs with swelling and she had gained 20 lbs at one point and is now down to 130 lbs.   Current Diagnosis/Assessment:  Goals Addressed            This Visit's Progress   . Pharmacy Care Plan       CARE PLAN ENTRY  Current Barriers:  . Chronic Disease Management support, education, and care coordination needs related to Hyperlipidemia, Diabetes, Atrial Fibrillation, Heart Failure, GERD, Hypothyroidism, and low vitamin B12 level, side pain, chronic insomnia   Hyperlipidemia Lipid Panel     Component Value Date/Time   CHOL 94 06/23/2019 0825   CHOL 135 04/26/2017 0840   TRIG 70.0 06/23/2019 0825   HDL 35.20 (L) 06/23/2019 0825   HDL 45 04/26/2017 0840   CHOLHDL 3 06/23/2019 0825   VLDL 14.0 06/23/2019 0825   LDLCALC 45 06/23/2019 0825   LDLCALC 70 04/26/2017 0840   LABVLDL 20 04/26/2017 0840   . Pharmacist Clinical Goal(s): o Over the next 120 days, patient will work with PharmD and providers to maintain LDL goal < 70 . Current regimen:  o No medications  . Patient self care activities - Over the next 120 days, patient will: o Continue without medications  Diabetes . Pharmacist Clinical Goal(s): o Over the next 120 days, patient will work with PharmD and providers to achieve A1c goal <7% . Current regimen:   Metformin ER 500mg , 1 tablet twice daily  Tresiba Flextouch, inject 18 units once daily at 10 PM . Interventions: o We discussed how to recognize and treat signs of hypoglycemia  . Patient self care activities - Over the next 120 days, patient will: o Check blood sugar twice daily, document, and provide at future appointments o Contact provider with any episodes of hypoglycemia  Atrial fibrillation . Pharmacist Clinical Goal(s) o Over the next 120 days, patient will work with PharmD and providers to maintain in normal sinus rhythm.   . Current regimen:   metprolol succinate (Toprol XL) 25mg , 1 tablet once daily   apixaban (Eliquis) 5mg  ,1 tablet twice daily  . Interventions: o We discussed: monitoring for signs and symptoms for bleeding (coughing up blood, prolonged nose bleeds, black, tarry stools). . Patient self care activities - Over the next 120 days, patient will: o Continue current medications as instructed.   Heart failure . Pharmacist Clinical Goal(s) o Over the next 120 days, patient will work with PharmD and providers to maintain weight stable and minimize fluid retention. . Current regimen:   Metoprolol succinate (Toprol XL) 25mg , 1 tablet once daily   Torsemide 10mg , 1 tablet once daily  . Interventions: o Recommended weighing herself daily. . Patient self care activities o Patient will continue current medications as instructed.   Hypothyroidism . Pharmacist Clinical Goal(s) o Over the next 120 days, patient will work with PharmD and providers to  maintain TSH: 0.450 - 4.500 uIU/mL . Current regimen:  o Levothyroxine 40mcg, 1 tablet once daily  . Interventions: o We discussed  administration of levothyroxine on an empty stomach, at least 30 minutes before first meal  . Patient self care activities o Patient will continue current medication.   GERD  . Pharmacist Clinical Goal(s) o Over the next 120 days, patient will work with PharmD and providers to minimize acid reflux symptoms.  . Current regimen:  o Omeprazole 20mg , 1 capsule twice daily  . Patient self care activities o Patient will continue current medication.   Low vitamin B12 . Pharmacist Clinical Goal(s) o Over the next 120 days, patient will work with PharmD and providers to maintain levels: 211 - 911 pg/mL . Current regimen:  o Vitamin B12 (cyanocobalamin) 1000 units, 1 tablet once daily  . Interventions: o We discussed medications that can decrease vitamin B-12 absorption (metformin and long-term intake of omeprazole).   . Patient self care activities o Patient will continue current medication as instructed.   Side pain . Pharmacist Clinical Goal(s) o Over the next 120 days, patient will work with PharmD and providers to minimize pain  . Current regimen:  o APAP 500mg , 1 tablets every six hours as needed for mild pain or moderate pain . Interventions: o  The maximum daily dose of acetaminophen was discussed with the patient. She was encouraged not to exceed 4,000 mg of acetaminophen during a 24 hour period and was asked to keep in mind that acetaminophen can also be found in many over-the-counter cold medications as well as narcotics that may be given for pain. . Patient self care activities o Patient will continue current medications as instructed.  Insomnia . Pharmacist Clinical Goal(s) o Over the next 120 days, patient will work with PharmD and providers to improve sleep . Current regimen:  o Trazodone 50mg , 0.5 (one-half) to 1 tablet at bedtime as needed for sleep . Interventions: o Discussed practicing good sleep hygiene by setting a sleep schedule and maintaining it, avoid excessive napping, following a nightly routine, avoiding screen time for 30-60 minutes before going to bed, and making the bedroom a cool, quiet and dark space . Patient self care activities o Patient will continue current medications as instructed.   Medication management . Pharmacist Clinical Goal(s): o Over the next 120 days, patient will work with PharmD and providers to maintain optimal medication adherence . [facility]  . Interventions o Comprehensive medication review performed. o Utilize [facility] for medication synchronization, packaging and delivery . Patient self care activities - Over the next 120 days, patient will: o Take medications as prescribed o Report any questions or concerns to PharmD and/or provider(s)  Please see past updates related to this goal by clicking on the "Past  Updates" button in the selected goal           AFIB   Patient is currently rate controlled. BP: 120/60 mmHg  HR: 60 BPM  HR: 1 day was skipping some beats; usually low BP 98/50  Patient has failed these meds in past: Flecainide (nausea), amiodarone (side effects)  Patient is currently controlled on the following medications:   metoprolol succinate (Toprol XL) 25mg , 1 tablet once daily  Anticoagulation:  CHA2DS2/VAS Stroke Risk Points    5 Points: High Risk    Details    This score determines the patient's risk of having a stroke if the  patient has atrial fibrillation.     Points  Metrics  1 Has Congestive Heart Failure:  Yes    Current as of 27 minutes ago  0 Has Vascular Disease:  No    Current as of 27 minutes ago  0 Has Hypertension:  No    Current as of 27 minutes ago  2 Age:  72    Current as of 27 minutes ago  1 Has Diabetes:  Yes    Current as of 27 minutes ago  0 Had Stroke:  No  Had TIA:  No  Had thromboembolism:  No    Current as of 27 minutes ago  1 Female:  Yes    Current as of 27 minutes ago      apixaban (Eliquis) 5mg  ,1 tablet twice daily  (dosing appropriate based on age, weight, and serum creatinine)   We discussed:  monitoring for signs and symptoms for bleeding (coughing up blood, prolonged nose bleeds, black, tarry stools). ; patient reports amiodarone was stopped a couple of weeks ago and she isn't feeling that much better  Plan Managed by cardiology (Dr. Martinique) Continue current medications   Diabetes   Recent Relevant Labs: Lab Results  Component Value Date/Time   HGBA1C 9.1 (H) 04/22/2020 04:17 PM   HGBA1C 8.3 (A) 10/27/2019 10:26 AM   HGBA1C 8.1 (H) 06/23/2019 08:25 AM   MICROALBUR 0.9 04/07/2013 07:58 AM    Checking BG: Daily  Recent FBG Readings: 82, 85  Patient has failed these meds in past: glimepiride, Invokana (yeast infections)   Patient is currently uncontrolled on the following medications:   Metformin ER  500mg , 1 tablet twice daily  Tresiba Flextouch, inject 18 units once daily at 10 PM   Last diabetic Foot exam:  Lab Results  Component Value Date/Time   HMDIABEYEEXA No Retinopathy 07/29/2018 12:00 AM    Last diabetic Eye exam:  Lab Results  Component Value Date/Time   HMDIABFOOTEX normal 08/28/2014 12:00 AM    We discussed: how to recognize and treat signs of hypoglycemia and titration schedule per Dr. Elease Hashimoto  -Diet: patient has been eating soft foods such as egg salad for lunch, grits or oatmeal, toast as she had 4 bottom teeth removed in October  -She had diarrhea this morning and couldn't make it to the bathroom and reports probably not eating enough food  -Exercise: patient is unable to obtain regular exercise as she has scoliosis and bulging disc   Plan Continue current medications  DM assessment with CPA in 2 weeks. Recommended checking at different times of the day in addition to FBG.  Heart Failure  Patient endorses swelling is under controlled at present.   Type: Diastolic  Last ejection fraction: 50 to 55% (10/28/2017)   NYHA Class: II (slight limitation of activity)  Patient is currently controlled on the following medications:   Metoprolol succinate (Toprol XL) 25mg , 1 tablet once daily   Torsemide 10mg , 1 tablet once daily   Plan Continue current medications  Hyperlipidemia   Lipid Panel     Component Value Date/Time   CHOL 94 06/23/2019 0825   CHOL 135 04/26/2017 0840   TRIG 70.0 06/23/2019 0825   HDL 35.20 (L) 06/23/2019 0825   HDL 45 04/26/2017 0840   LDLCALC 45 06/23/2019 0825   LDLCALC 70 04/26/2017 0840     The ASCVD Risk score (Goff DC Jr., et al., 2013) failed to calculate for the following reasons:   The 2013 ASCVD risk score is only valid for ages 28 to 24   Patient  has previously tried: atorvastatin (muscle pain and low LDL) Patient is currently controlled on the following medications:   No medications   Plan Continue  current medications  Recommend repeat lipid panel as patient is overdue.  Hypothyroidism   Lab Results  Component Value Date/Time   TSH 2.08 04/22/2020 04:17 PM   TSH 2.690 12/11/2019 02:54 PM   TSH 3.210 09/04/2019 12:49 PM   Patient is currently controlled on the following medications:  . Levothyroxine 26mcg, 1 tablet once daily   We discussed:  administration of levothyroxine on an empty stomach, at least 30 minutes before first meal   Plan Continue current medications   GERD   Patient is currently controlled on the following medications:   Omeprazole 20mg , 1 capsule twice daily   Plan Continue current medications  Assess at follow up PPI taper.   Osteoarthritis   Patient is currently controlled on the following medications:  . APAP 500 mg, 2 tablets every six hours as needed for mild pain or moderate pain   Plan Continue current medications  Vitamin D deficiency (resolved)    Last vitamin D Lab Results  Component Value Date   VD25OH 74 09/07/2011    Patient is currently controlled on the following medications:  Marland Kitchen Vitamin D3 5000 units, 1 capsule once daily   Plan Continue current medications  Allergic rhinitis    Patient is currently controlled on the following medications:  . Loratadine 10mg ,1 tablet once daily as needed for allergies   Plan Continue current medications  Insomnia   Patient reports still having difficulties maintaining sleep. Patient willing to try melatonin again.   Patient is currently uncontrolled on the following medications:  . Trazodone 50mg , 0.5 (one-half) to 1 tablet at bedtime as needed for sleep   We discussed:  Practicing good sleep hygiene by setting a sleep schedule and maintaining it, avoid excessive napping, following a nightly routine, avoiding screen time for 30-60 minutes before going to bed, and making the bedroom a cool, quiet and dark space  Plan Continue current medications    Low Vitamin B12    Vitamin B12:  163 (12/21/2018)  372 (05/15/2019)    Patient is currently controlled on the following medications:  Marland Kitchen Vitamin B12 (cyanocobalamin) 1000 units, 1 tablet once daily   Plan Continue current medications   Medication Management   Patient's preferred pharmacy is:  [id number] [address], [place] [address] Phone: [phone] Fax: [phone]  [place], [address], [address] [place], [place] [address] Phone: [phone] Fax: [phone]  Uses pill box? Yes Pt endorses 80% compliance  We discussed: Discussed benefits of medication synchronization, packaging and delivery as well as enhanced pharmacist oversight with [facility].  Plan  Utilize [facility] pharmacy for medication synchronization, packaging and delivery  Patient is having trouble with transportation with obtaining her medications. Will call back later this week for onboarding to [facility].  Follow up: 4 month phone visit  Jeni Salles, PharmD [place] Pharmacist [place] [facility] [place] [phone]

## 2020-07-24 NOTE — Patient Instructions (Signed)
Hi Julie Norton,  It was lovely to get to speak with you over the phone today! As we discussed, go ahead and continue checking your morning blood sugars as well as one other time each day, alternating between 2 hours after a meal and before bedtime and make sure to keep a log of the numbers. My assistant will reach out to you in a few weeks to see what those look like!  Please give me a call if you need anything or have any questions before our follow up!  Best, Maddie  Julie Norton, PharmD [facility] Clinical Pharmacist [place] [facility] [place]   Visit Information  Goals Addressed            This Visit's Progress   . Pharmacy Care Plan       CARE PLAN ENTRY  Current Barriers:  . Chronic Disease Management support, education, and care coordination needs related to Hyperlipidemia, Diabetes, Atrial Fibrillation, Heart Failure, GERD, Hypothyroidism, and low vitamin B12 level, side pain, chronic insomnia   Hyperlipidemia Lipid Panel     Component Value Date/Time   CHOL 94 06/23/2019 0825   CHOL 135 04/26/2017 0840   TRIG 70.0 06/23/2019 0825   HDL 35.20 (L) 06/23/2019 0825   HDL 45 04/26/2017 0840   CHOLHDL 3 06/23/2019 0825   VLDL 14.0 06/23/2019 0825   LDLCALC 45 06/23/2019 0825   LDLCALC 70 04/26/2017 0840   LABVLDL 20 04/26/2017 0840   . Pharmacist Clinical Goal(s): o Over the next 120 days, patient will work with PharmD and providers to maintain LDL goal < 70 . Current regimen:  o No medications  . Patient self care activities - Over the next 120 days, patient will: o Continue without medications  Diabetes . Pharmacist Clinical Goal(s): o Over the next 120 days, patient will work with PharmD and providers to achieve A1c goal <7% . Current regimen:   Metformin ER 500mg , 1 tablet twice daily  Tresiba Flextouch, inject 18 units once daily at 10 PM . Interventions: o We discussed how to recognize and treat signs of hypoglycemia  . Patient self  care activities - Over the next 120 days, patient will: o Check blood sugar twice daily, document, and provide at future appointments o Contact provider with any episodes of hypoglycemia  Atrial fibrillation . Pharmacist Clinical Goal(s) o Over the next 120 days, patient will work with PharmD and providers to maintain in normal sinus rhythm.  . Current regimen:   metprolol succinate (Toprol XL) 25mg , 1 tablet once daily   apixaban (Eliquis) 5mg  ,1 tablet twice daily  . Interventions: o We discussed: monitoring for signs and symptoms for bleeding (coughing up blood, prolonged nose bleeds, black, tarry stools). . Patient self care activities - Over the next 120 days, patient will: o Continue current medications as instructed.   Heart failure . Pharmacist Clinical Goal(s) o Over the next 120 days, patient will work with PharmD and providers to maintain weight stable and minimize fluid retention. . Current regimen:   Metoprolol succinate (Toprol XL) 25mg , 1 tablet once daily   Torsemide 10mg , 1 tablet once daily  . Interventions: o Recommended weighing herself daily. . Patient self care activities o Patient will continue current medications as instructed.   Hypothyroidism . Pharmacist Clinical Goal(s) o Over the next 120 days, patient will work with PharmD and providers to maintain TSH: 0.450 - 4.500 uIU/mL . Current regimen:  o Levothyroxine 32mcg, 1 tablet once daily  . Interventions: o We discussed  administration of levothyroxine on an empty stomach, at least 30 minutes before first meal  . Patient self care activities o Patient will continue current medication.   GERD  . Pharmacist Clinical Goal(s) o Over the next 120 days, patient will work with PharmD and providers to minimize acid reflux symptoms.  . Current regimen:  o Omeprazole 20mg , 1 capsule twice daily  . Patient self care activities o Patient will continue current medication.   Low vitamin B12 . Pharmacist  Clinical Goal(s) o Over the next 120 days, patient will work with PharmD and providers to maintain levels: 211 - 911 pg/mL . Current regimen:  o Vitamin B12 (cyanocobalamin) 1000 units, 1 tablet once daily  . Interventions: o We discussed medications that can decrease vitamin B-12 absorption (metformin and long-term intake of omeprazole).  . Patient self care activities o Patient will continue current medication as instructed.   Side pain . Pharmacist Clinical Goal(s) o Over the next 120 days, patient will work with PharmD and providers to minimize pain  . Current regimen:  o APAP 500mg , 1 tablets every six hours as needed for mild pain or moderate pain . Interventions: o  The maximum daily dose of acetaminophen was discussed with the patient. She was encouraged not to exceed 4,000 mg of acetaminophen during a 24 hour period and was asked to keep in mind that acetaminophen can also be found in many over-the-counter cold medications as well as narcotics that may be given for pain. . Patient self care activities o Patient will continue current medications as instructed.  Insomnia . Pharmacist Clinical Goal(s) o Over the next 120 days, patient will work with PharmD and providers to improve sleep . Current regimen:  o Trazodone 50mg , 0.5 (one-half) to 1 tablet at bedtime as needed for sleep . Interventions: o Discussed practicing good sleep hygiene by setting a sleep schedule and maintaining it, avoid excessive napping, following a nightly routine, avoiding screen time for 30-60 minutes before going to bed, and making the bedroom a cool, quiet and dark space . Patient self care activities o Patient will continue current medications as instructed.   Medication management . Pharmacist Clinical Goal(s): o Over the next 120 days, patient will work with PharmD and providers to maintain optimal medication adherence . [facility]  . Interventions o Comprehensive medication  review performed. o Utilize [facility] for medication synchronization, packaging and delivery . Patient self care activities - Over the next 120 days, patient will: o Take medications as prescribed o Report any questions or concerns to PharmD and/or provider(s)  Please see past updates related to this goal by clicking on the "Past Updates" button in the selected goal         The patient verbalized understanding of instructions, educational materials, and care plan provided today and declined offer to receive copy of patient instructions, educational materials, and care plan.   Telephone follow up appointment with pharmacy team member scheduled for: 4 months  Viona Gilmore, [facility]

## 2020-07-25 DIAGNOSIS — E1165 Type 2 diabetes mellitus with hyperglycemia: Secondary | ICD-10-CM | POA: Diagnosis not present

## 2020-07-25 DIAGNOSIS — R262 Difficulty in walking, not elsewhere classified: Secondary | ICD-10-CM | POA: Diagnosis not present

## 2020-07-25 DIAGNOSIS — Z7901 Long term (current) use of anticoagulants: Secondary | ICD-10-CM | POA: Diagnosis not present

## 2020-07-25 DIAGNOSIS — D641 Secondary sideroblastic anemia due to disease: Secondary | ICD-10-CM | POA: Diagnosis not present

## 2020-07-25 DIAGNOSIS — E538 Deficiency of other specified B group vitamins: Secondary | ICD-10-CM | POA: Diagnosis not present

## 2020-07-25 DIAGNOSIS — I209 Angina pectoris, unspecified: Secondary | ICD-10-CM | POA: Diagnosis not present

## 2020-07-25 DIAGNOSIS — M1990 Unspecified osteoarthritis, unspecified site: Secondary | ICD-10-CM | POA: Diagnosis not present

## 2020-07-25 DIAGNOSIS — G4700 Insomnia, unspecified: Secondary | ICD-10-CM | POA: Diagnosis not present

## 2020-07-25 DIAGNOSIS — I4510 Unspecified right bundle-branch block: Secondary | ICD-10-CM | POA: Diagnosis not present

## 2020-07-25 DIAGNOSIS — E7800 Pure hypercholesterolemia, unspecified: Secondary | ICD-10-CM | POA: Diagnosis not present

## 2020-07-25 DIAGNOSIS — N6082 Other benign mammary dysplasias of left breast: Secondary | ICD-10-CM | POA: Diagnosis not present

## 2020-07-25 DIAGNOSIS — I4891 Unspecified atrial fibrillation: Secondary | ICD-10-CM | POA: Diagnosis not present

## 2020-07-25 DIAGNOSIS — K219 Gastro-esophageal reflux disease without esophagitis: Secondary | ICD-10-CM | POA: Diagnosis not present

## 2020-07-25 DIAGNOSIS — E785 Hyperlipidemia, unspecified: Secondary | ICD-10-CM | POA: Diagnosis not present

## 2020-07-25 DIAGNOSIS — I872 Venous insufficiency (chronic) (peripheral): Secondary | ICD-10-CM | POA: Diagnosis not present

## 2020-07-25 DIAGNOSIS — Z794 Long term (current) use of insulin: Secondary | ICD-10-CM | POA: Diagnosis not present

## 2020-07-25 DIAGNOSIS — Z7984 Long term (current) use of oral hypoglycemic drugs: Secondary | ICD-10-CM | POA: Diagnosis not present

## 2020-07-25 DIAGNOSIS — I340 Nonrheumatic mitral (valve) insufficiency: Secondary | ICD-10-CM | POA: Diagnosis not present

## 2020-07-25 DIAGNOSIS — I509 Heart failure, unspecified: Secondary | ICD-10-CM | POA: Diagnosis not present

## 2020-07-25 DIAGNOSIS — E039 Hypothyroidism, unspecified: Secondary | ICD-10-CM | POA: Diagnosis not present

## 2020-07-25 DIAGNOSIS — C50912 Malignant neoplasm of unspecified site of left female breast: Secondary | ICD-10-CM | POA: Diagnosis not present

## 2020-07-26 ENCOUNTER — Other Ambulatory Visit: Payer: Self-pay | Admitting: Family Medicine

## 2020-07-26 ENCOUNTER — Other Ambulatory Visit: Payer: Self-pay

## 2020-07-26 ENCOUNTER — Telehealth: Payer: Self-pay | Admitting: Cardiology

## 2020-07-26 DIAGNOSIS — I509 Heart failure, unspecified: Secondary | ICD-10-CM | POA: Diagnosis not present

## 2020-07-26 DIAGNOSIS — I340 Nonrheumatic mitral (valve) insufficiency: Secondary | ICD-10-CM | POA: Diagnosis not present

## 2020-07-26 DIAGNOSIS — E1165 Type 2 diabetes mellitus with hyperglycemia: Secondary | ICD-10-CM | POA: Diagnosis not present

## 2020-07-26 DIAGNOSIS — R262 Difficulty in walking, not elsewhere classified: Secondary | ICD-10-CM | POA: Diagnosis not present

## 2020-07-26 DIAGNOSIS — Z794 Long term (current) use of insulin: Secondary | ICD-10-CM | POA: Diagnosis not present

## 2020-07-26 DIAGNOSIS — C50912 Malignant neoplasm of unspecified site of left female breast: Secondary | ICD-10-CM | POA: Diagnosis not present

## 2020-07-26 DIAGNOSIS — G4700 Insomnia, unspecified: Secondary | ICD-10-CM | POA: Diagnosis not present

## 2020-07-26 DIAGNOSIS — I4891 Unspecified atrial fibrillation: Secondary | ICD-10-CM | POA: Diagnosis not present

## 2020-07-26 DIAGNOSIS — I4510 Unspecified right bundle-branch block: Secondary | ICD-10-CM | POA: Diagnosis not present

## 2020-07-26 DIAGNOSIS — D641 Secondary sideroblastic anemia due to disease: Secondary | ICD-10-CM | POA: Diagnosis not present

## 2020-07-26 DIAGNOSIS — M1990 Unspecified osteoarthritis, unspecified site: Secondary | ICD-10-CM | POA: Diagnosis not present

## 2020-07-26 DIAGNOSIS — E785 Hyperlipidemia, unspecified: Secondary | ICD-10-CM | POA: Diagnosis not present

## 2020-07-26 DIAGNOSIS — E7800 Pure hypercholesterolemia, unspecified: Secondary | ICD-10-CM | POA: Diagnosis not present

## 2020-07-26 DIAGNOSIS — N6082 Other benign mammary dysplasias of left breast: Secondary | ICD-10-CM | POA: Diagnosis not present

## 2020-07-26 DIAGNOSIS — E039 Hypothyroidism, unspecified: Secondary | ICD-10-CM | POA: Diagnosis not present

## 2020-07-26 DIAGNOSIS — Z7901 Long term (current) use of anticoagulants: Secondary | ICD-10-CM | POA: Diagnosis not present

## 2020-07-26 DIAGNOSIS — Z7984 Long term (current) use of oral hypoglycemic drugs: Secondary | ICD-10-CM | POA: Diagnosis not present

## 2020-07-26 DIAGNOSIS — K219 Gastro-esophageal reflux disease without esophagitis: Secondary | ICD-10-CM | POA: Diagnosis not present

## 2020-07-26 DIAGNOSIS — E538 Deficiency of other specified B group vitamins: Secondary | ICD-10-CM | POA: Diagnosis not present

## 2020-07-26 DIAGNOSIS — I872 Venous insufficiency (chronic) (peripheral): Secondary | ICD-10-CM | POA: Diagnosis not present

## 2020-07-26 DIAGNOSIS — I209 Angina pectoris, unspecified: Secondary | ICD-10-CM | POA: Diagnosis not present

## 2020-07-26 DIAGNOSIS — E0590 Thyrotoxicosis, unspecified without thyrotoxic crisis or storm: Secondary | ICD-10-CM

## 2020-07-26 MED ORDER — CVS D3 125 MCG (5000 UT) PO CAPS
5000.0000 [IU] | ORAL_CAPSULE | Freq: Every day | ORAL | 1 refills | Status: DC
Start: 2020-07-26 — End: 2020-11-21

## 2020-07-26 MED ORDER — LEVOTHYROXINE SODIUM 50 MCG PO TABS: 50.0000 ug | ORAL_TABLET | Freq: Every day | ORAL | 1 refills | Status: DC

## 2020-07-26 MED ORDER — TORSEMIDE 10 MG PO TABS
10.0000 mg | ORAL_TABLET | Freq: Every day | ORAL | 1 refills | Status: DC
Start: 2020-07-26 — End: 2020-09-13

## 2020-07-26 MED ORDER — OMEPRAZOLE 20 MG PO CPDR
20.0000 mg | DELAYED_RELEASE_CAPSULE | Freq: Two times a day (BID) | ORAL | 1 refills | Status: DC
Start: 2020-07-26 — End: 2021-08-07

## 2020-07-26 MED ORDER — POTASSIUM CHLORIDE ER 10 MEQ PO TBCR
10.0000 meq | EXTENDED_RELEASE_TABLET | Freq: Every day | ORAL | 1 refills | Status: DC
Start: 2020-07-26 — End: 2020-11-21

## 2020-07-26 MED ORDER — TRAZODONE HCL 50 MG PO TABS
25.0000 mg | ORAL_TABLET | Freq: Every evening | ORAL | 1 refills | Status: DC | PRN
Start: 2020-07-26 — End: 2020-09-18

## 2020-07-26 MED ORDER — TRESIBA FLEXTOUCH 100 UNIT/ML ~~LOC~~ SOPN
PEN_INJECTOR | SUBCUTANEOUS | 1 refills | Status: DC
Start: 2020-07-26 — End: 2021-04-07

## 2020-07-26 MED ORDER — METFORMIN HCL ER 500 MG PO TB24
500.0000 mg | ORAL_TABLET | Freq: Two times a day (BID) | ORAL | 1 refills | Status: DC
Start: 2020-07-26 — End: 2021-06-19

## 2020-07-26 NOTE — Telephone Encounter (Signed)
*  STAT* If patient is at the pharmacy, call can be transferred to refill team.   1. Which medications need to be refilled? (please list name of each medication and dose if known) need a new prescriptions for her Eliquis and Metoprolol- changing pharmacy  2. Which pharm [facility]- fax to---[phone]  3. Do they need a 30 day or 90 day supply? 90 days and refills

## 2020-07-26 NOTE — Telephone Encounter (Signed)
-----   Message from Viona Gilmore, [facility] sent at 07/26/2020  2:58 PM EST ----- Regarding: Refills Hi,  Ms. Cude requested to transfer to [facility]. Can you please send refills of the following there:  -Vitamin D 5000 units -Levothyroxine 50 mcg -Metformin XR 500 mg -Omeprazole 20 mg -Potassium chloride 10 mEq -Torsemide 10 mg -Trazodone 50 mg -Tresiba flextouch  Thank you, Maddie

## 2020-07-26 NOTE — Telephone Encounter (Signed)
Requested medications has been sent to [facility]

## 2020-07-29 ENCOUNTER — Other Ambulatory Visit: Payer: Self-pay

## 2020-07-29 ENCOUNTER — Encounter: Payer: Self-pay | Admitting: Family Medicine

## 2020-07-29 ENCOUNTER — Ambulatory Visit (INDEPENDENT_AMBULATORY_CARE_PROVIDER_SITE_OTHER): Payer: Medicare Other | Admitting: Family Medicine

## 2020-07-29 VITALS — BP 140/70 | HR 62 | Wt 135.0 lb

## 2020-07-29 DIAGNOSIS — R600 Localized edema: Secondary | ICD-10-CM | POA: Diagnosis not present

## 2020-07-29 DIAGNOSIS — E1165 Type 2 diabetes mellitus with hyperglycemia: Secondary | ICD-10-CM | POA: Diagnosis not present

## 2020-07-29 DIAGNOSIS — N898 Other specified noninflammatory disorders of vagina: Secondary | ICD-10-CM

## 2020-07-29 DIAGNOSIS — I4819 Other persistent atrial fibrillation: Secondary | ICD-10-CM | POA: Diagnosis not present

## 2020-07-29 LAB — POCT GLYCOSYLATED HEMOGLOBIN (HGB A1C): Hemoglobin A1C: 7.8 % — AB (ref 4.0–5.6)

## 2020-07-29 MED ORDER — APIXABAN 5 MG PO TABS
5.0000 mg | ORAL_TABLET | Freq: Two times a day (BID) | ORAL | 1 refills | Status: DC
Start: 2020-07-29 — End: 2020-07-29

## 2020-07-29 MED ORDER — FLUCONAZOLE 150 MG PO TABS: 150.0000 mg | ORAL_TABLET | Freq: Once | ORAL | 0 refills | Status: AC

## 2020-07-29 MED ORDER — APIXABAN 5 MG PO TABS
5.0000 mg | ORAL_TABLET | Freq: Two times a day (BID) | ORAL | 1 refills | Status: DC
Start: 2020-07-29 — End: 2020-11-21

## 2020-07-29 MED ORDER — METOPROLOL SUCCINATE ER 25 MG PO TB24
25.0000 mg | ORAL_TABLET | Freq: Every day | ORAL | 3 refills | Status: DC
Start: 2020-07-29 — End: 2021-08-07

## 2020-07-29 NOTE — Patient Instructions (Signed)
A1C is improved at 7.8%  I sent in the Fluconazole 150 mg for vaginitis symptoms  We ordered walker (you can take prescription with you).   Reduce the Tresiba to 17 units daily.   Let's plan on 3 month follow up.

## 2020-07-29 NOTE — Progress Notes (Signed)
Established Patient Office Visit  Subjective:  Patient ID: Julie Norton, female    DOB: [birth year]  Age: 87 y.o. MRN: [id number]  CC: No chief complaint on file.   HPI Julie Norton presents for medical follow-up.  She has history of atrial fibrillation, type 2 diabetes, osteoarthritis involving multiple joints, hyperlipidemia, history of ductal carcinoma in situ left breast.  She was scheduled for surgery several months ago but put this off because of some increased edema issues.  Her edema is currently very stable on torsemide 10 units daily.  She is on potassium supplement.  She had recent urinalysis with no proteinuria per cardiology and had basic metabolic panel which showed stable renal function and electrolytes.  Her home weights have been very stable.  These are usually low 130s to 134 range.  History of poorly controlled diabetes.  Last A1c was 9.1%.  She is on [place] and has decreased this herself to 19 units daily.  She has recently had some occasional hypoglycemic symptoms with sugars 60s and 70s.  Also remains on Metformin.  Mostly checking fasting blood sugars.  She has severe low back pain and has been followed by back specialist with orthopedics for that.  She is requesting prescription for walker for home use.  She has had extensive physical therapy and is still getting some physical therapy at this time  Recent issue of some vaginal itching but no discharge.  She is concerned she may have yeast vaginitis.  She has had these in the past.  No burning with urination.  No recent antibiotic use.  Past Medical History:  Diagnosis Date  . Abdominal adhesions   . Abdominal gas pain    suspected -- may be related to intermittent right upper quadrant discomfort radiating around her back      . Anemia   . Anginal pain ([place])   . Arthritis   . Atrial fibrillation ([place])   . Breast cancer ([place])   . Breast mass    left breast  . Cancer ([place])    left breast  . Chest pain     intermittent right upper quadrant discomfort radiating around her back      . Complication of anesthesia   . Diabetes mellitus without complication ([place])    Type II  . Dyspepsia   . Dyspnea   . GERD (gastroesophageal reflux disease)   . H/O: hysterectomy 1978  . History of cardioversion   . Hypercholesteremia   . Hypothyroidism   . Mitral insufficiency   . Personal history of radiation therapy   . Pneumonia   . PONV (postoperative nausea and vomiting)   . Right bundle branch block (RBBB)   . Tendinitis    of the right arm and shoulder  . Torn tendon    right shoulder    Past Surgical History:  Procedure Laterality Date  . ABDOMINAL HYSTERECTOMY    . APPENDECTOMY  1978  . BLADDER REPAIR    . BREAST LUMPECTOMY Left 02/24/2011   central partial mastectomy - dr Margot Chimes  . BREAST REDUCTION SURGERY    . CARDIOVERSION N/A 10/28/2017   Procedure: CARDIOVERSION;  Surgeon: Lelon Perla, MD;  Location: [facility] ENDOSCOPY;  Service: Cardiovascular;  Laterality: N/A;  . CARDIOVERSION N/A 04/06/2019   Procedure: CARDIOVERSION;  Surgeon: [name], MD;  Location: [facility] ENDOSCOPY;  Service: Cardiovascular;  Laterality: N/A;  . CARDIOVERSION N/A 09/08/2019   Procedure: CARDIOVERSION;  Surgeon: Dorothy Spark, MD;  Location: [facility];  Service: Cardiovascular;  Laterality: N/A;  . COLONOSCOPY WITH PROPOFOL N/A 03/24/2017   Procedure: COLONOSCOPY WITH PROPOFOL;  Surgeon: Charlott Rakes, MD;  Location: [facility] ENDOSCOPY;  Service: Endoscopy;  Laterality: N/A;  . ESOPHAGOGASTRODUODENOSCOPY (EGD) WITH PROPOFOL N/A 03/24/2017   Procedure: ESOPHAGOGASTRODUODENOSCOPY (EGD) WITH PROPOFOL;  Surgeon: Charlott Rakes, MD;  Location: [facility] ENDOSCOPY;  Service: Endoscopy;  Laterality: N/A;  . LIPOMA EXCISION Right   . REDUCTION MAMMAPLASTY Bilateral 1998  . RETINAL DETACHMENT SURGERY Left   . SALPINGOOPHORECTOMY    . TEE WITHOUT CARDIOVERSION N/A 10/28/2017   Procedure: TRANSESOPHAGEAL  ECHOCARDIOGRAM (TEE);  Surgeon: Lewayne Bunting, MD;  Location: [facility] ENDOSCOPY;  Service: Cardiovascular;  Laterality: N/A;  . TONSILLECTOMY AND ADENOIDECTOMY  1950   at 87 years of age    Family History  Problem Relation Age of Onset  . Pneumonia Father   . Heart attack Mother   . Hypertension Mother   . Stroke Mother   . Diabetes Mother   . Aortic aneurysm Mother        abdominal aortic aneurysm  . Cancer Mother        bladder  . Aortic aneurysm Sister        abdominal aortic aneurysm    Social History   Socioeconomic History  . Marital status: Married    Spouse name: Not on file  . Number of children: Not on file  . Years of education: Not on file  . Highest education level: Not on file  Occupational History  . Not on file  Tobacco Use  . Smoking status: Never Smoker  . Smokeless tobacco: Never Used  Vaping Use  . Vaping Use: Never used  Substance and Sexual Activity  . Alcohol use: No  . Drug use: No  . Sexual activity: Not on file  Other Topics Concern  . Not on file  Social History Narrative  . Not on file   Social Determinants of Health   Financial Resource Strain: Medium Risk  . Difficulty of Paying Living Expenses: Somewhat hard  Food Insecurity: Not on file  Transportation Needs: No Transportation Needs  . Lack of Transportation (Medical): No  . Lack of Transportation (Non-Medical): No  Physical Activity: Not on file  Stress: Not on file  Social Connections: Not on file  Intimate Partner Violence: Not on file    Outpatient Medications Prior to Visit  Medication Sig Dispense Refill  . acetaminophen (TYLENOL) 500 MG tablet Take 1,000 mg by mouth every 6 (six) hours as needed for mild pain or moderate pain.     Marland Kitchen CALCIUM PO Take 1 tablet by mouth daily.    . Cholecalciferol (CVS D3) 125 MCG (5000 UT) capsule Take 1 capsule (5,000 Units total) by mouth daily. 90 capsule 1  . Cyanocobalamin (B-12) 1000 MCG CAPS Take 1 capsule by mouth daily.    Marland Kitchen  ELIQUIS 5 MG TABS tablet TAKE 1 TABLET BY MOUTH TWICE A DAY 180 tablet 1  . glucose blood (ONE TOUCH ULTRA TEST) test strip Check once daily. E11.40 100 each 11  . insulin degludec (TRESIBA FLEXTOUCH) 100 UNIT/ML FlexTouch Pen INJECT 24 UNITS TOTAL INTO THE SKIN DAILY AT 10PM (Patient taking differently: INJECT 19 UNITS TOTAL INTO THE SKIN DAILY AT 10PM) 100 mL 1  . Insulin Pen Needle 29G X MISC Use once daily 100 each 3  . levothyroxine (SYNTHROID) 50 MCG tablet Take 1 tablet (50 mcg total) by mouth daily. 90 tablet 1  . loratadine (CLARITIN) 10  MG tablet Take 10 mg by mouth daily as needed for allergies.    . metFORMIN (GLUCOPHAGE-XR) 500 MG 24 hr tablet Take 1 tablet (500 mg total) by mouth 2 (two) times daily. 180 tablet 1  . metoprolol succinate (TOPROL-XL) 25 MG 24 hr tablet Take 1 tablet (25 mg total) by mouth daily. 90 tablet 3  . omeprazole (PRILOSEC) 20 MG capsule Take 1 capsule (20 mg total) by mouth 2 (two) times daily. 180 capsule 1  . ONE TOUCH ULTRA TEST test strip CHECK ONCE A DAY 50 each 3  . potassium chloride (KLOR-CON) 10 MEQ tablet Take 1 tablet (10 mEq total) by mouth daily. 90 tablet 1  . torsemide (DEMADEX) 10 MG tablet Take 1 tablet (10 mg total) by mouth daily. 30 tablet 1  . traZODone (DESYREL) 50 MG tablet Take 0.5-1 tablets (25-50 mg total) by mouth at bedtime as needed. for sleep 90 tablet 1  . triamcinolone cream (KENALOG) 0.1 % Apply 1 application topically 2 (two) times daily. 30 g 2  . vitamin B-12 (CYANOCOBALAMIN) 1000 MCG tablet Take 1 tablet (1,000 mcg total) by mouth daily. 90 tablet 3   No facility-administered medications prior to visit.    Allergies  Allergen Reactions  . Bactrim Nausea And Vomiting  . Dilaudid [Hydromorphone Hcl] Other (See Comments)    Unknown  . Hydrocodone Nausea And Vomiting  . Invokana [Canagliflozin] Itching and Other (See Comments)    Yeast Infections   . Macrodantin Nausea And Vomiting    ROS Review of Systems   Constitutional: Negative for chills and fever.  Eyes: Negative for visual disturbance.  Respiratory: Negative for cough, chest tightness, shortness of breath and wheezing.   Cardiovascular: Negative for chest pain, palpitations and leg swelling.  Genitourinary: Negative for dysuria, vaginal discharge and vaginal pain.       See HPI  Neurological: Negative for dizziness, tremors, seizures, syncope, weakness, light-headedness and headaches.  Psychiatric/Behavioral: Negative for dysphoric mood.      Objective:    Physical Exam Vitals reviewed.  Constitutional:      Appearance: Normal appearance.  Cardiovascular:     Rate and Rhythm: Normal rate and regular rhythm.  Pulmonary:     Effort: Pulmonary effort is normal.     Breath sounds: Normal breath sounds.  Musculoskeletal:     Cervical back: Neck supple.     Comments: Trace nonpitting edema lower legs bilaterally  Neurological:     General: No focal deficit present.     Mental Status: She is alert.     BP 140/70   Pulse 62   Wt 135 lb (61.2 kg)   SpO2 98%   BMI 25.51 kg/m  Wt Readings from Last 3 Encounters:  07/29/20 135 lb (61.2 kg)  06/19/20 139 lb 11.2 oz (63.4 kg)  06/13/20 140 lb (63.5 kg)     Health Maintenance Due  Topic Date Due  . URINE MICROALBUMIN  04/07/2014  . FOOT EXAM  08/29/2015  . PNA vac Low Risk Adult (2 of 2 - PPSV23) 03/03/2017  . OPHTHALMOLOGY EXAM  07/30/2019  . COVID-19 Vaccine (3 - Inadvertent risk 4-dose series) 12/01/2019    There are no preventive care reminders to display for this patient.  Lab Results  Component Value Date   TSH 2.08 04/22/2020   Lab Results  Component Value Date   WBC 4.7 02/19/2020   HGB 9.4 (L) 02/19/2020   HCT 31.5 (L) 02/19/2020   MCV 80.8 02/19/2020  PLT 158 02/19/2020   Lab Results  Component Value Date   NA 141 06/13/2020   K 5.0 06/13/2020   CHLORIDE 111 (H) 04/03/2014   CO2 23 06/13/2020   GLUCOSE 178 (H) 06/13/2020   BUN 13 06/13/2020    CREATININE 0.81 06/13/2020   BILITOT 0.4 12/11/2019   ALKPHOS 87 12/11/2019   AST 39 12/11/2019   ALT 32 12/11/2019   PROT 6.4 12/11/2019   ALBUMIN 4.1 12/11/2019   CALCIUM 9.4 06/13/2020   ANIONGAP 8 02/19/2020   GFR 70.88 06/23/2019   Lab Results  Component Value Date   CHOL 94 06/23/2019   Lab Results  Component Value Date   HDL 35.20 (L) 06/23/2019   Lab Results  Component Value Date   LDLCALC 45 06/23/2019   Lab Results  Component Value Date   TRIG 70.0 06/23/2019   Lab Results  Component Value Date   CHOLHDL 3 06/23/2019   Lab Results  Component Value Date   HGBA1C 7.8 (A) 07/29/2020      Assessment & Plan:   #1 history of type 2 diabetes.  History of poor control.  Patient on combination therapy with Metformin and Tresiba.  She had some recent mild hypoglycemic symptoms.  A1c improved to 7.8%.  We are aiming for a goal of around 8% or less with A1c.  We will reduce her [name] to 17 units daily and be in touch if she has any recurrent hypoglycemic symptoms. She is encouraged to try to eat at least 3 regular meals per day  #2 vaginal pruritus.  Increased risk of yeast vaginitis. -Sent in fluconazole 150 mg tablet x1 dose.  Be in touch if symptoms not improving with that  #3 history of chronic severe low back pain.  Increasing difficulties with ambulation. -Prescription for Rollator walker with seat for home use  #4 hypertension stable  Meds ordered this encounter  Medications  . fluconazole (DIFLUCAN) 150 MG tablet    Sig: Take 1 tablet (150 mg total) by mouth once for 1 dose.    Dispense:  1 tablet    Refill:  0    Follow-up: No follow-ups on file.    [name], MD

## 2020-07-30 DIAGNOSIS — H5203 Hypermetropia, bilateral: Secondary | ICD-10-CM | POA: Diagnosis not present

## 2020-07-30 DIAGNOSIS — H35033 Hypertensive retinopathy, bilateral: Secondary | ICD-10-CM | POA: Diagnosis not present

## 2020-07-30 DIAGNOSIS — H524 Presbyopia: Secondary | ICD-10-CM | POA: Diagnosis not present

## 2020-07-30 DIAGNOSIS — H52223 Regular astigmatism, bilateral: Secondary | ICD-10-CM | POA: Diagnosis not present

## 2020-07-30 LAB — HM DIABETES EYE EXAM

## 2020-07-31 DIAGNOSIS — K219 Gastro-esophageal reflux disease without esophagitis: Secondary | ICD-10-CM | POA: Diagnosis not present

## 2020-07-31 DIAGNOSIS — E538 Deficiency of other specified B group vitamins: Secondary | ICD-10-CM | POA: Diagnosis not present

## 2020-07-31 DIAGNOSIS — E7800 Pure hypercholesterolemia, unspecified: Secondary | ICD-10-CM | POA: Diagnosis not present

## 2020-07-31 DIAGNOSIS — Z7984 Long term (current) use of oral hypoglycemic drugs: Secondary | ICD-10-CM | POA: Diagnosis not present

## 2020-07-31 DIAGNOSIS — E1165 Type 2 diabetes mellitus with hyperglycemia: Secondary | ICD-10-CM | POA: Diagnosis not present

## 2020-07-31 DIAGNOSIS — I872 Venous insufficiency (chronic) (peripheral): Secondary | ICD-10-CM | POA: Diagnosis not present

## 2020-07-31 DIAGNOSIS — I509 Heart failure, unspecified: Secondary | ICD-10-CM | POA: Diagnosis not present

## 2020-07-31 DIAGNOSIS — G4700 Insomnia, unspecified: Secondary | ICD-10-CM | POA: Diagnosis not present

## 2020-07-31 DIAGNOSIS — E785 Hyperlipidemia, unspecified: Secondary | ICD-10-CM | POA: Diagnosis not present

## 2020-07-31 DIAGNOSIS — Z794 Long term (current) use of insulin: Secondary | ICD-10-CM | POA: Diagnosis not present

## 2020-07-31 DIAGNOSIS — R262 Difficulty in walking, not elsewhere classified: Secondary | ICD-10-CM | POA: Diagnosis not present

## 2020-07-31 DIAGNOSIS — C50912 Malignant neoplasm of unspecified site of left female breast: Secondary | ICD-10-CM | POA: Diagnosis not present

## 2020-07-31 DIAGNOSIS — D641 Secondary sideroblastic anemia due to disease: Secondary | ICD-10-CM | POA: Diagnosis not present

## 2020-07-31 DIAGNOSIS — Z7901 Long term (current) use of anticoagulants: Secondary | ICD-10-CM | POA: Diagnosis not present

## 2020-07-31 DIAGNOSIS — M1990 Unspecified osteoarthritis, unspecified site: Secondary | ICD-10-CM | POA: Diagnosis not present

## 2020-07-31 DIAGNOSIS — I340 Nonrheumatic mitral (valve) insufficiency: Secondary | ICD-10-CM | POA: Diagnosis not present

## 2020-07-31 DIAGNOSIS — I4510 Unspecified right bundle-branch block: Secondary | ICD-10-CM | POA: Diagnosis not present

## 2020-07-31 DIAGNOSIS — I4891 Unspecified atrial fibrillation: Secondary | ICD-10-CM | POA: Diagnosis not present

## 2020-07-31 DIAGNOSIS — I209 Angina pectoris, unspecified: Secondary | ICD-10-CM | POA: Diagnosis not present

## 2020-07-31 DIAGNOSIS — E039 Hypothyroidism, unspecified: Secondary | ICD-10-CM | POA: Diagnosis not present

## 2020-07-31 DIAGNOSIS — N6082 Other benign mammary dysplasias of left breast: Secondary | ICD-10-CM | POA: Diagnosis not present

## 2020-08-02 DIAGNOSIS — I872 Venous insufficiency (chronic) (peripheral): Secondary | ICD-10-CM | POA: Diagnosis not present

## 2020-08-06 DIAGNOSIS — E538 Deficiency of other specified B group vitamins: Secondary | ICD-10-CM | POA: Diagnosis not present

## 2020-08-06 DIAGNOSIS — Z794 Long term (current) use of insulin: Secondary | ICD-10-CM | POA: Diagnosis not present

## 2020-08-06 DIAGNOSIS — K219 Gastro-esophageal reflux disease without esophagitis: Secondary | ICD-10-CM | POA: Diagnosis not present

## 2020-08-06 DIAGNOSIS — I872 Venous insufficiency (chronic) (peripheral): Secondary | ICD-10-CM | POA: Diagnosis not present

## 2020-08-06 DIAGNOSIS — I509 Heart failure, unspecified: Secondary | ICD-10-CM | POA: Diagnosis not present

## 2020-08-06 DIAGNOSIS — N6082 Other benign mammary dysplasias of left breast: Secondary | ICD-10-CM | POA: Diagnosis not present

## 2020-08-06 DIAGNOSIS — C50912 Malignant neoplasm of unspecified site of left female breast: Secondary | ICD-10-CM | POA: Diagnosis not present

## 2020-08-06 DIAGNOSIS — E7800 Pure hypercholesterolemia, unspecified: Secondary | ICD-10-CM | POA: Diagnosis not present

## 2020-08-06 DIAGNOSIS — D641 Secondary sideroblastic anemia due to disease: Secondary | ICD-10-CM | POA: Diagnosis not present

## 2020-08-06 DIAGNOSIS — M1990 Unspecified osteoarthritis, unspecified site: Secondary | ICD-10-CM | POA: Diagnosis not present

## 2020-08-06 DIAGNOSIS — R262 Difficulty in walking, not elsewhere classified: Secondary | ICD-10-CM | POA: Diagnosis not present

## 2020-08-06 DIAGNOSIS — E119 Type 2 diabetes mellitus without complications: Secondary | ICD-10-CM | POA: Diagnosis not present

## 2020-08-06 DIAGNOSIS — E039 Hypothyroidism, unspecified: Secondary | ICD-10-CM | POA: Diagnosis not present

## 2020-08-06 DIAGNOSIS — I340 Nonrheumatic mitral (valve) insufficiency: Secondary | ICD-10-CM | POA: Diagnosis not present

## 2020-08-06 DIAGNOSIS — I4891 Unspecified atrial fibrillation: Secondary | ICD-10-CM | POA: Diagnosis not present

## 2020-08-06 DIAGNOSIS — I4510 Unspecified right bundle-branch block: Secondary | ICD-10-CM | POA: Diagnosis not present

## 2020-08-06 DIAGNOSIS — G4700 Insomnia, unspecified: Secondary | ICD-10-CM | POA: Diagnosis not present

## 2020-08-06 DIAGNOSIS — Z7901 Long term (current) use of anticoagulants: Secondary | ICD-10-CM | POA: Diagnosis not present

## 2020-08-06 DIAGNOSIS — I209 Angina pectoris, unspecified: Secondary | ICD-10-CM | POA: Diagnosis not present

## 2020-08-06 DIAGNOSIS — E785 Hyperlipidemia, unspecified: Secondary | ICD-10-CM | POA: Diagnosis not present

## 2020-08-08 ENCOUNTER — Telehealth: Payer: Self-pay | Admitting: Physician Assistant

## 2020-08-08 NOTE — Telephone Encounter (Signed)
Pt called stating that she had implant posts placed today. She unfortunately had significant bleeding after she got home and had to return to the dentist for management of bleeding. She stated he treated the bleeding, but deferred to Dr. Martinique for holding eliquis tonight. Since she had bleeding complication today requiring intervention, I think its reasonable to hold eliquis for tonight and resume tomorrow morning if no further bleeding.   I advised that I would send message to Dr. Martinique.

## 2020-08-14 DIAGNOSIS — E119 Type 2 diabetes mellitus without complications: Secondary | ICD-10-CM | POA: Diagnosis not present

## 2020-08-14 DIAGNOSIS — Z794 Long term (current) use of insulin: Secondary | ICD-10-CM | POA: Diagnosis not present

## 2020-08-16 ENCOUNTER — Telehealth: Payer: Self-pay | Admitting: Pharmacist

## 2020-08-16 DIAGNOSIS — E7800 Pure hypercholesterolemia, unspecified: Secondary | ICD-10-CM | POA: Diagnosis not present

## 2020-08-16 DIAGNOSIS — E119 Type 2 diabetes mellitus without complications: Secondary | ICD-10-CM | POA: Diagnosis not present

## 2020-08-16 DIAGNOSIS — I209 Angina pectoris, unspecified: Secondary | ICD-10-CM | POA: Diagnosis not present

## 2020-08-16 DIAGNOSIS — E039 Hypothyroidism, unspecified: Secondary | ICD-10-CM | POA: Diagnosis not present

## 2020-08-16 DIAGNOSIS — G4700 Insomnia, unspecified: Secondary | ICD-10-CM | POA: Diagnosis not present

## 2020-08-16 DIAGNOSIS — I872 Venous insufficiency (chronic) (peripheral): Secondary | ICD-10-CM | POA: Diagnosis not present

## 2020-08-16 DIAGNOSIS — I340 Nonrheumatic mitral (valve) insufficiency: Secondary | ICD-10-CM | POA: Diagnosis not present

## 2020-08-16 DIAGNOSIS — M1990 Unspecified osteoarthritis, unspecified site: Secondary | ICD-10-CM | POA: Diagnosis not present

## 2020-08-16 DIAGNOSIS — I4510 Unspecified right bundle-branch block: Secondary | ICD-10-CM | POA: Diagnosis not present

## 2020-08-16 DIAGNOSIS — N6082 Other benign mammary dysplasias of left breast: Secondary | ICD-10-CM | POA: Diagnosis not present

## 2020-08-16 DIAGNOSIS — E538 Deficiency of other specified B group vitamins: Secondary | ICD-10-CM | POA: Diagnosis not present

## 2020-08-16 DIAGNOSIS — I509 Heart failure, unspecified: Secondary | ICD-10-CM | POA: Diagnosis not present

## 2020-08-16 DIAGNOSIS — R262 Difficulty in walking, not elsewhere classified: Secondary | ICD-10-CM | POA: Diagnosis not present

## 2020-08-16 DIAGNOSIS — E785 Hyperlipidemia, unspecified: Secondary | ICD-10-CM | POA: Diagnosis not present

## 2020-08-16 DIAGNOSIS — C50912 Malignant neoplasm of unspecified site of left female breast: Secondary | ICD-10-CM | POA: Diagnosis not present

## 2020-08-16 DIAGNOSIS — I4891 Unspecified atrial fibrillation: Secondary | ICD-10-CM | POA: Diagnosis not present

## 2020-08-16 DIAGNOSIS — Z7901 Long term (current) use of anticoagulants: Secondary | ICD-10-CM | POA: Diagnosis not present

## 2020-08-16 DIAGNOSIS — D641 Secondary sideroblastic anemia due to disease: Secondary | ICD-10-CM | POA: Diagnosis not present

## 2020-08-16 DIAGNOSIS — Z794 Long term (current) use of insulin: Secondary | ICD-10-CM | POA: Diagnosis not present

## 2020-08-16 DIAGNOSIS — K219 Gastro-esophageal reflux disease without esophagitis: Secondary | ICD-10-CM | POA: Diagnosis not present

## 2020-08-16 NOTE — Chronic Care Management (AMB) (Signed)
Chronic Care Management Pharmacy Assistant   Name: Julie Norton  MRN: [id number] DOB: 10-08-1929  Reason for Encounter: Disease State/Diabetic Adherence Call  PCP : Eulas Post, MD  Allergies:   Allergies  Allergen Reactions   Bactrim Nausea And Vomiting   Dilaudid [Hydromorphone Hcl] Other (See Comments)    Unknown   Hydrocodone Nausea And Vomiting   Invokana [Canagliflozin] Itching and Other (See Comments)    Yeast Infections    Macrodantin Nausea And Vomiting    Medications: Outpatient Encounter Medications as of 08/16/2020  Medication Sig   acetaminophen (TYLENOL) 500 MG tablet Take 1,000 mg by mouth every 6 (six) hours as needed for mild pain or moderate pain.    apixaban (ELIQUIS) 5 MG TABS tablet Take 1 tablet (5 mg total) by mouth 2 (two) times daily.   CALCIUM PO Take 1 tablet by mouth daily.   Cholecalciferol (CVS D3) 125 MCG (5000 UT) capsule Take 1 capsule (5,000 Units total) by mouth daily.   Cyanocobalamin (B-12) 1000 MCG CAPS Take 1 capsule by mouth daily.   glucose blood (ONE TOUCH ULTRA TEST) test strip Check once daily. E11.40   insulin degludec (TRESIBA FLEXTOUCH) 100 UNIT/ML FlexTouch Pen INJECT 24 UNITS TOTAL INTO THE SKIN DAILY AT 10PM (Patient taking differently: INJECT 19 UNITS TOTAL INTO THE SKIN DAILY AT 10PM)   Insulin Pen Needle 29G X 5MM MISC Use once daily   levothyroxine (SYNTHROID) 50 MCG tablet Take 1 tablet (50 mcg total) by mouth daily.   loratadine (CLARITIN) 10 MG tablet Take 10 mg by mouth daily as needed for allergies.   metFORMIN (GLUCOPHAGE-XR) 500 MG 24 hr tablet Take 1 tablet (500 mg total) by mouth 2 (two) times daily.   metoprolol succinate (TOPROL-XL) 25 MG 24 hr tablet Take 1 tablet (25 mg total) by mouth daily.   omeprazole (PRILOSEC) 20 MG capsule Take 1 capsule (20 mg total) by mouth 2 (two) times daily.   ONE TOUCH ULTRA TEST test strip CHECK ONCE A DAY   potassium chloride (KLOR-CON) 10 MEQ  tablet Take 1 tablet (10 mEq total) by mouth daily.   torsemide (DEMADEX) 10 MG tablet Take 1 tablet (10 mg total) by mouth daily.   traZODone (DESYREL) 50 MG tablet Take 0.5-1 tablets (25-50 mg total) by mouth at bedtime as needed. for sleep   triamcinolone cream (KENALOG) 0.1 % Apply 1 application topically 2 (two) times daily.   vitamin B-12 (CYANOCOBALAMIN) 1000 MCG tablet Take 1 tablet (1,000 mcg total) by mouth daily.   No facility-administered encounter medications on file as of 08/16/2020.    Current Diagnosis: Patient Active Problem List   Diagnosis Date Noted   Degeneration of lumbar intervertebral disc 02/03/2019   GERD (gastroesophageal reflux disease) 11/30/2017   Other persistent atrial fibrillation ([place])    Injury of muscle of rotator cuff 10/12/2017   Iron deficiency anemia, unspecified 03/24/2017   Poorly controlled type 2 diabetes mellitus ([place]) 02/23/2017   Mitral insufficiency 02/04/2016   Right bundle branch block 05/16/2015   Chest pain of unknown etiology 05/16/2015   Peripheral neuropathy 08/23/2014   Heart murmur previously undiagnosed 01/22/2014   Abdominal pain 11/11/2011   Ductal carcinoma in situ (DCIS) of left breast 01/12/2011   Hypercholesterolemia 11/28/2010   Hypothyroidism 11/28/2010   Postmenopausal state 11/28/2010   Osteoarthritis 11/28/2010   Dyspepsia 11/28/2010    Goals Addressed   None    Recent Relevant Labs: Lab Results  Component Value Date/Time  HGBA1C 7.8 (A) 07/29/2020 01:37 PM   HGBA1C 9.1 (H) 04/22/2020 04:17 PM   HGBA1C 8.3 (A) 10/27/2019 10:26 AM   HGBA1C 8.1 (H) 06/23/2019 08:25 AM   MICROALBUR 0.9 04/07/2013 07:58 AM    Kidney Function Lab Results  Component Value Date/Time   CREATININE 0.81 06/13/2020 02:15 PM   CREATININE 0.81 04/22/2020 04:17 PM   CREATININE 0.72 02/19/2020 12:14 PM   CREATININE 0.69 07/30/2016 08:08 AM   CREATININE 0.8 04/03/2014 10:26 AM   CREATININE 0.8 03/20/2013  09:10 AM   GFR 70.88 06/23/2019 08:25 AM   GFRNONAA 65 06/13/2020 02:15 PM   GFRAA 75 06/13/2020 02:15 PM     Current antihyperglycemic regimen:  o Metformin ER 500 mg, 1 tablet twice daily o Tresiba Flextouch, inject 18 units once daily at 10 PM   What recent interventions/DTPs have been made to improve glycemic control:  o None  Have there been any recent hospitalizations or ED visits since last visit with CPP? No  Patient denies hypoglycemic symptoms, including Pale, Sweaty, Shaky, Hungry, Nervous/irritable and Vision changes  Patient denies hyperglycemic symptoms, including blurry vision, excessive thirst, fatigue, polyuria and weakness  How often are you checking your blood sugar? once daily  What are your blood sugars ranging?  o Fasting:  - 02/02 158 02/08 132 02/09 148 02/10 140 02/11 113 o Lunch:  - 02/02 134 02/08 122 02/09 133 02/10 123   During the week, how often does your blood glucose drop below 70? Never  Are you checking your feet daily/regularly? Yes  Adherence Review: Is the patient currently on a STATIN medication? No Is the patient currently on ACE/ARB medication? No Does the patient have >5 day gap between last estimated fill dates? No   Dondra Prader Follow-Up:  Pharmacist Review   Maia Breslow, [place] Assistant [phone]

## 2020-08-21 ENCOUNTER — Ambulatory Visit: Payer: Medicare Other | Admitting: Family Medicine

## 2020-08-21 DIAGNOSIS — G4700 Insomnia, unspecified: Secondary | ICD-10-CM | POA: Diagnosis not present

## 2020-08-21 DIAGNOSIS — I509 Heart failure, unspecified: Secondary | ICD-10-CM | POA: Diagnosis not present

## 2020-08-21 DIAGNOSIS — I4891 Unspecified atrial fibrillation: Secondary | ICD-10-CM | POA: Diagnosis not present

## 2020-08-21 DIAGNOSIS — C50912 Malignant neoplasm of unspecified site of left female breast: Secondary | ICD-10-CM | POA: Diagnosis not present

## 2020-08-21 DIAGNOSIS — R262 Difficulty in walking, not elsewhere classified: Secondary | ICD-10-CM | POA: Diagnosis not present

## 2020-08-21 DIAGNOSIS — E785 Hyperlipidemia, unspecified: Secondary | ICD-10-CM | POA: Diagnosis not present

## 2020-08-21 DIAGNOSIS — I4510 Unspecified right bundle-branch block: Secondary | ICD-10-CM | POA: Diagnosis not present

## 2020-08-21 DIAGNOSIS — E039 Hypothyroidism, unspecified: Secondary | ICD-10-CM | POA: Diagnosis not present

## 2020-08-21 DIAGNOSIS — N6082 Other benign mammary dysplasias of left breast: Secondary | ICD-10-CM | POA: Diagnosis not present

## 2020-08-21 DIAGNOSIS — D641 Secondary sideroblastic anemia due to disease: Secondary | ICD-10-CM | POA: Diagnosis not present

## 2020-08-21 DIAGNOSIS — I209 Angina pectoris, unspecified: Secondary | ICD-10-CM | POA: Diagnosis not present

## 2020-08-21 DIAGNOSIS — E7800 Pure hypercholesterolemia, unspecified: Secondary | ICD-10-CM | POA: Diagnosis not present

## 2020-08-21 DIAGNOSIS — E119 Type 2 diabetes mellitus without complications: Secondary | ICD-10-CM | POA: Diagnosis not present

## 2020-08-21 DIAGNOSIS — M1990 Unspecified osteoarthritis, unspecified site: Secondary | ICD-10-CM | POA: Diagnosis not present

## 2020-08-21 DIAGNOSIS — Z794 Long term (current) use of insulin: Secondary | ICD-10-CM | POA: Diagnosis not present

## 2020-08-21 DIAGNOSIS — I872 Venous insufficiency (chronic) (peripheral): Secondary | ICD-10-CM | POA: Diagnosis not present

## 2020-08-21 DIAGNOSIS — I340 Nonrheumatic mitral (valve) insufficiency: Secondary | ICD-10-CM | POA: Diagnosis not present

## 2020-08-21 DIAGNOSIS — Z7901 Long term (current) use of anticoagulants: Secondary | ICD-10-CM | POA: Diagnosis not present

## 2020-08-21 DIAGNOSIS — E538 Deficiency of other specified B group vitamins: Secondary | ICD-10-CM | POA: Diagnosis not present

## 2020-08-21 DIAGNOSIS — K219 Gastro-esophageal reflux disease without esophagitis: Secondary | ICD-10-CM | POA: Diagnosis not present

## 2020-08-27 ENCOUNTER — Telehealth: Payer: Self-pay | Admitting: Family Medicine

## 2020-08-27 NOTE — Telephone Encounter (Signed)
Left message for patient to call back and schedule Medicare Annual Wellness Visit (AWV) either virtually or in office. No detailed message left  Last AWVS please schedule at anytime with [facility] Nurse Health Advisor 1 or 2   This should be a 45 minute visit.

## 2020-08-29 DIAGNOSIS — E785 Hyperlipidemia, unspecified: Secondary | ICD-10-CM | POA: Diagnosis not present

## 2020-08-29 DIAGNOSIS — I4891 Unspecified atrial fibrillation: Secondary | ICD-10-CM | POA: Diagnosis not present

## 2020-08-29 DIAGNOSIS — I4510 Unspecified right bundle-branch block: Secondary | ICD-10-CM | POA: Diagnosis not present

## 2020-08-29 DIAGNOSIS — E7800 Pure hypercholesterolemia, unspecified: Secondary | ICD-10-CM | POA: Diagnosis not present

## 2020-08-29 DIAGNOSIS — E039 Hypothyroidism, unspecified: Secondary | ICD-10-CM | POA: Diagnosis not present

## 2020-08-29 DIAGNOSIS — I209 Angina pectoris, unspecified: Secondary | ICD-10-CM | POA: Diagnosis not present

## 2020-08-29 DIAGNOSIS — I340 Nonrheumatic mitral (valve) insufficiency: Secondary | ICD-10-CM | POA: Diagnosis not present

## 2020-08-29 DIAGNOSIS — D641 Secondary sideroblastic anemia due to disease: Secondary | ICD-10-CM | POA: Diagnosis not present

## 2020-08-29 DIAGNOSIS — M1990 Unspecified osteoarthritis, unspecified site: Secondary | ICD-10-CM | POA: Diagnosis not present

## 2020-08-29 DIAGNOSIS — E538 Deficiency of other specified B group vitamins: Secondary | ICD-10-CM | POA: Diagnosis not present

## 2020-08-29 DIAGNOSIS — G4700 Insomnia, unspecified: Secondary | ICD-10-CM | POA: Diagnosis not present

## 2020-08-29 DIAGNOSIS — K219 Gastro-esophageal reflux disease without esophagitis: Secondary | ICD-10-CM | POA: Diagnosis not present

## 2020-08-29 DIAGNOSIS — Z794 Long term (current) use of insulin: Secondary | ICD-10-CM | POA: Diagnosis not present

## 2020-08-29 DIAGNOSIS — E119 Type 2 diabetes mellitus without complications: Secondary | ICD-10-CM | POA: Diagnosis not present

## 2020-08-29 DIAGNOSIS — R262 Difficulty in walking, not elsewhere classified: Secondary | ICD-10-CM | POA: Diagnosis not present

## 2020-08-29 DIAGNOSIS — I872 Venous insufficiency (chronic) (peripheral): Secondary | ICD-10-CM | POA: Diagnosis not present

## 2020-08-29 DIAGNOSIS — Z7901 Long term (current) use of anticoagulants: Secondary | ICD-10-CM | POA: Diagnosis not present

## 2020-08-29 DIAGNOSIS — C50912 Malignant neoplasm of unspecified site of left female breast: Secondary | ICD-10-CM | POA: Diagnosis not present

## 2020-08-29 DIAGNOSIS — N6082 Other benign mammary dysplasias of left breast: Secondary | ICD-10-CM | POA: Diagnosis not present

## 2020-08-29 DIAGNOSIS — I509 Heart failure, unspecified: Secondary | ICD-10-CM | POA: Diagnosis not present

## 2020-08-30 ENCOUNTER — Telehealth: Payer: Self-pay | Admitting: Family Medicine

## 2020-08-30 NOTE — Telephone Encounter (Signed)
Julie Norton w/[facility] is calling in to let Dr. Elease Hashimoto know that the pt is being discharged with goals met.  If you should have any questions you can call Julie Norton and if no answer may leave a detail msg on her secured voice mail.

## 2020-08-30 NOTE — Telephone Encounter (Signed)
noted 

## 2020-09-10 ENCOUNTER — Telehealth: Payer: Self-pay | Admitting: Pharmacist

## 2020-09-10 NOTE — Chronic Care Management (AMB) (Addendum)
Chronic Care Management Pharmacy Assistant   Name: RAELEEN WINSTANLEY  MRN: [id number] DOB: 07/19/1929  Reason for Encounter: Medication Review   Recent office visits:  None  Recent consult visits: None  Hospital visits:  None in previous 6 months  Medications: Outpatient Encounter Medications as of 09/10/2020  Medication Sig   acetaminophen (TYLENOL) 500 MG tablet Take 1,000 mg by mouth every 6 (six) hours as needed for mild pain or moderate pain.    apixaban (ELIQUIS) 5 MG TABS tablet Take 1 tablet (5 mg total) by mouth 2 (two) times daily.   CALCIUM PO Take 1 tablet by mouth daily.   Cholecalciferol (CVS D3) 125 MCG (5000 UT) capsule Take 1 capsule (5,000 Units total) by mouth daily.   Cyanocobalamin (B-12) 1000 MCG CAPS Take 1 capsule by mouth daily.   glucose blood (ONE TOUCH ULTRA TEST) test strip Check once daily. E11.40   insulin degludec (TRESIBA FLEXTOUCH) 100 UNIT/ML FlexTouch Pen INJECT 24 UNITS TOTAL INTO THE SKIN DAILY AT 10PM (Patient taking differently: INJECT 19 UNITS TOTAL INTO THE SKIN DAILY AT 10PM)   Insulin Pen Needle 29G X 5MM MISC Use once daily   levothyroxine (SYNTHROID) 50 MCG tablet Take 1 tablet (50 mcg total) by mouth daily.   loratadine (CLARITIN) 10 MG tablet Take 10 mg by mouth daily as needed for allergies.   metFORMIN (GLUCOPHAGE-XR) 500 MG 24 hr tablet Take 1 tablet (500 mg total) by mouth 2 (two) times daily.   metoprolol succinate (TOPROL-XL) 25 MG 24 hr tablet Take 1 tablet (25 mg total) by mouth daily.   omeprazole (PRILOSEC) 20 MG capsule Take 1 capsule (20 mg total) by mouth 2 (two) times daily.   ONE TOUCH ULTRA TEST test strip CHECK ONCE A DAY   potassium chloride (KLOR-CON) 10 MEQ tablet Take 1 tablet (10 mEq total) by mouth daily.   torsemide (DEMADEX) 10 MG tablet Take 1 tablet (10 mg total) by mouth daily.   traZODone (DESYREL) 50 MG tablet Take 0.5-1 tablets (25-50 mg total) by mouth at bedtime as needed. for sleep   triamcinolone  cream (KENALOG) 0.1 % Apply 1 application topically 2 (two) times daily.   vitamin B-12 (CYANOCOBALAMIN) 1000 MCG tablet Take 1 tablet (1,000 mcg total) by mouth daily.   No facility-administered encounter medications on file as of 09/10/2020.   Reviewed chart for medication changes ahead of medication coordination call.  No OVs, Consults, or hospital visits since last care coordination call/Pharmacist visit. (If appropriate, list visit date, provider name)  No medication changes indicated OR if recent visit, treatment plan here.  BP Readings from Last 3 Encounters:  07/29/20 140/70  06/19/20 (!) 122/50  06/13/20 (!) 133/55    Lab Results  Component Value Date   HGBA1C 7.8 (A) 07/29/2020     Patient obtains medications through Adherence Packaging  30 Days  Last adherence delivery included:  Apixaban (ELIQUIS) 5 mg: One tablet at breakfast and one at dinner Calcium Chloride Er: One tablet at breakfast Insulin degludec (TRESIBA FLEXTOUCH) 100 UNIT/ML FlexTouch Pen  Patient declined the following medication  last month due to additional supply on hand. Cholecalciferol (CVS D3) 125 mcg: one capsule at breakfast Cyanocobalamin (B-12) 1000 mcg: One tablet at dinner Levothyroxine (SYNTHROID) 50 mcg: One tablet before breakfast Metformin (GLUCOPHAGE-XR) 500 MG 24hr: One tablet at breakfast and one at dinner Metoprolol succinate (TOPROL-XL) 25 MG 24hr: one tablet at breakfast Omeprazole (PRILOSEC) 20 mg: one capsule at breakfast and one  at dinner Torsemide (DEMADEX) 10 mg: one tablet at breakfast Trazodone (DESYREL) 50 mg: one tablet at bedtime Triamcinolone cream (KENALOG) 0.1 %: Apply to area twice daily as needed. Metformin (GLUCOPHAGE-XR) 500 MG 24 hr tablet  Patient is due for next adherence delivery on: 09/24/2020. Called patient and reviewed medications and coordinated delivery. This delivery to include: Apixaban (ELIQUIS) 5 mg: One tablet at breakfast and one at dinner Calcium  + vitamin D: One tablet at dinner Cholecalciferol (CVS D3) 125 mcg: one capsule at breakfast Cyanocobalamin (B-12) 1000 mcg: one tablet at dinner Levothyroxine (SYNTHROID) 50 mcg: One tablet before breakfast Metoprolol succinate (TOPROL-XL) 25 MG 24hr: one tablet at breakfast Omeprazole (PRILOSEC) 20 mg: one capsule at breakfast and one at dinner Torsemide (DEMADEX) 10 mg: one tablet at breakfast Trazodone (DESYREL) 50 mg: one tablet at bedtime Potassium chloride 10 mEq: one tablet at breakfast  Patient declined the following medications the following medications due to additional supply on hand Metformin (GLUCOPHAGE-XR) 500 MG 24 hr tablet Insulin degludec (TRESIBA FLEXTOUCH) 100 UNIT/ML FlexTouch Pen   Patient needs refill for:   Torsemide (DEMADEX) 10 MG tablet  Confirmed delivery date of 09/24/2020, advised patient that pharmacy will contact them the morning of delivery.  Star Rating Drugs: Metformin 500 mg 24hr      09/05/2020  90 days supply   Coordination of Enhanced Pharmacy Service [place] (Oswaldo Milian) Mare Ferrari, [place] Assistant [phone]

## 2020-09-11 ENCOUNTER — Other Ambulatory Visit: Payer: Self-pay

## 2020-09-11 ENCOUNTER — Ambulatory Visit (INDEPENDENT_AMBULATORY_CARE_PROVIDER_SITE_OTHER): Payer: Medicare Other

## 2020-09-11 DIAGNOSIS — Z0000 Encounter for general adult medical examination without abnormal findings: Secondary | ICD-10-CM

## 2020-09-11 NOTE — Progress Notes (Signed)
Virtual Visit via Telephone Note  I connected with  Julie Norton on 09/11/20 at  2:30 PM EST by telephone and verified that I am speaking with the correct person using two identifiers.  Medicare Annual Wellness visit completed telephonically due to Covid-19 pandemic.   Persons participating in this call: This Health Coach and this patient.   Location: Patient: Home Provider: Office   I discussed the limitations, risks, security and privacy concerns of performing an evaluation and management service by telephone and the availability of in person appointments. The patient expressed understanding and agreed to proceed.  Unable to perform video visit due to video visit attempted and failed and/or patient does not have video capability.   Some vital signs may be absent or patient reported.   Willette Brace, LPN    Subjective:   Julie Norton is a 87 y.o. female who presents for an Initial Medicare Annual Wellness Visit.  Review of Systems     Cardiac Risk Factors include: advanced age (>73men, >40 women);diabetes mellitus     Objective:    Today's Vitals   09/11/20 1417  PainSc: 3    There is no height or weight on file to calculate BMI.  Advanced Directives 09/11/2020 05/10/2020 02/19/2020 09/08/2019 03/27/2019 10/28/2017 03/24/2017  Does Patient Have a Medical Advance Directive? Yes Yes Yes Yes No No Yes  Type of Paramedic of [address];Living will - [place];Living will Living will;Healthcare Power of [place];Living will  Copy of [place] in Chart? No - copy requested No - copy requested No - copy requested No - copy requested - - No - copy requested  Would patient like information on creating a medical advance directive? - - - - No - Patient declined No - Patient declined -    Current Medications (verified) Outpatient Encounter Medications as of 09/11/2020  Medication Sig  .  acetaminophen (TYLENOL) 500 MG tablet Take 1,000 mg by mouth every 6 (six) hours as needed for mild pain or moderate pain.   Marland Kitchen apixaban (ELIQUIS) 5 MG TABS tablet Take 1 tablet (5 mg total) by mouth 2 (two) times daily.  Marland Kitchen CALCIUM PO Take 1 tablet by mouth daily.  . Cholecalciferol ([facility] D3) 125 MCG (5000 UT) capsule Take 1 capsule (5,000 Units total) by mouth daily.  Marland Kitchen glucose blood (ONE TOUCH ULTRA TEST) test strip Check once daily. E11.40  . insulin degludec (TRESIBA FLEXTOUCH) 100 UNIT/ML FlexTouch Pen INJECT 24 UNITS TOTAL INTO THE SKIN DAILY AT 10PM (Patient taking differently: INJECT 17 UNITS TOTAL INTO THE SKIN DAILY AT 10PM)  . Insulin Pen Needle 29G X 5MM MISC Use once daily  . levothyroxine (SYNTHROID) 50 MCG tablet Take 1 tablet (50 mcg total) by mouth daily.  Marland Kitchen loratadine (CLARITIN) 10 MG tablet Take 10 mg by mouth daily as needed for allergies.  . metFORMIN (GLUCOPHAGE-XR) 500 MG 24 hr tablet Take 1 tablet (500 mg total) by mouth 2 (two) times daily.  . metoprolol succinate (TOPROL-XL) 25 MG 24 hr tablet Take 1 tablet (25 mg total) by mouth daily.  Marland Kitchen omeprazole (PRILOSEC) 20 MG capsule Take 1 capsule (20 mg total) by mouth 2 (two) times daily.  . ONE TOUCH ULTRA TEST test strip CHECK ONCE A DAY  . potassium chloride (KLOR-CON) 10 MEQ tablet Take 1 tablet (10 mEq total) by mouth daily.  Marland Kitchen torsemide (DEMADEX) 10 MG tablet Take 1 tablet (10 mg  total) by mouth daily.  . traZODone (DESYREL) 50 MG tablet Take 0.5-1 tablets (25-50 mg total) by mouth at bedtime as needed. for sleep  . vitamin B-12 (CYANOCOBALAMIN) 1000 MCG tablet Take 1 tablet (1,000 mcg total) by mouth daily.  . [DISCONTINUED] Cyanocobalamin (B-12) 1000 MCG CAPS Take 1 capsule by mouth daily.  . [DISCONTINUED] triamcinolone cream (KENALOG) 0.1 % Apply 1 application topically 2 (two) times daily. (Patient not taking: Reported on 09/11/2020)   No facility-administered encounter medications on file as of 09/11/2020.     Allergies (verified) Bactrim, Dilaudid [hydromorphone hcl], Hydrocodone, Invokana [canagliflozin], and Macrodantin   History: Past Medical History:  Diagnosis Date  . Abdominal adhesions   . Abdominal gas pain    suspected -- may be related to intermittent right upper quadrant discomfort radiating around her back      . Anemia   . Anginal pain ([place])   . Arthritis   . Atrial fibrillation ([place])   . Breast cancer ([place])   . Breast mass    left breast  . Cancer ([place])    left breast  . Chest pain    intermittent right upper quadrant discomfort radiating around her back      . Complication of anesthesia   . Diabetes mellitus without complication ([place])    Type II  . Dyspepsia   . Dyspnea   . GERD (gastroesophageal reflux disease)   . H/O: hysterectomy 1978  . History of cardioversion   . Hypercholesteremia   . Hypothyroidism   . Mitral insufficiency   . Personal history of radiation therapy   . Pneumonia   . PONV (postoperative nausea and vomiting)   . Right bundle branch block (RBBB)   . Tendinitis    of the right arm and shoulder  . Torn tendon    right shoulder   Past Surgical History:  Procedure Laterality Date  . ABDOMINAL HYSTERECTOMY    . APPENDECTOMY  1978  . BLADDER REPAIR    . BREAST LUMPECTOMY Left 02/24/2011   central partial mastectomy - dr Margot Chimes  . BREAST REDUCTION SURGERY    . CARDIOVERSION N/A 10/28/2017   Procedure: CARDIOVERSION;  Surgeon: Lelon Perla, MD;  Location: [facility] ENDOSCOPY;  Service: Cardiovascular;  Laterality: N/A;  . CARDIOVERSION N/A 04/06/2019   Procedure: CARDIOVERSION;  Surgeon: [name], MD;  Location: [facility] ENDOSCOPY;  Service: Cardiovascular;  Laterality: N/A;  . CARDIOVERSION N/A 09/08/2019   Procedure: CARDIOVERSION;  Surgeon: Dorothy Spark, MD;  Location: [place];  Service: Cardiovascular;  Laterality: N/A;  . COLONOSCOPY WITH PROPOFOL N/A 03/24/2017   Procedure: COLONOSCOPY WITH PROPOFOL;  Surgeon:  Wilford Corner, MD;  Location: [place];  Service: Endoscopy;  Laterality: N/A;  . ESOPHAGOGASTRODUODENOSCOPY (EGD) WITH PROPOFOL N/A 03/24/2017   Procedure: ESOPHAGOGASTRODUODENOSCOPY (EGD) WITH PROPOFOL;  Surgeon: Wilford Corner, MD;  Location: [place];  Service: Endoscopy;  Laterality: N/A;  . LIPOMA EXCISION Right   . REDUCTION MAMMAPLASTY Bilateral 1998  . RETINAL DETACHMENT SURGERY Left   . SALPINGOOPHORECTOMY    . TEE WITHOUT CARDIOVERSION N/A 10/28/2017   Procedure: TRANSESOPHAGEAL ECHOCARDIOGRAM (TEE);  Surgeon: Lelon Perla, MD;  Location[facility] ENDOSCOPY;  Service: Cardiovascular;  Laterality: N/A;  . [place]   at 86 years of age   Family History  Problem Relation Age of Onset  . Pneumonia Father   . Heart attack Mother   . Hypertension Mother   . Stroke Mother   . Diabetes Mother   .  Aortic aneurysm Mother        abdominal aortic aneurysm  . Cancer Mother        bladder  . Aortic aneurysm Sister        abdominal aortic aneurysm   Social History   Socioeconomic History  . Marital status: Married    Spouse name: Not on file  . Number of children: Not on file  . Years of education: Not on file  . Highest education level: Not on file  Occupational History  . Not on file  Tobacco Use  . Smoking status: Never Smoker  . Smokeless tobacco: Never Used  Vaping Use  . Vaping Use: Never used  Substance and Sexual Activity  . Alcohol use: No  . Drug use: No  . Sexual activity: Not on file  Other Topics Concern  . Not on file  Social History Narrative  . Not on file   Social Determinants of Health   Financial Resource Strain: Low Risk   . Difficulty of Paying Living Expenses: Not hard at all  Food Insecurity: No Food Insecurity  . Worried About Charity fundraiser in the Last Year: Never true  . Ran Out of Food in the Last Year: Never true  Transportation Needs: No Transportation Needs  . Lack of Transportation  (Medical): No  . Lack of Transportation (Non-Medical): No  Physical Activity: Inactive  . Days of Exercise per Week: 0 days  . Minutes of Exercise per Session: 0 min  Stress: No Stress Concern Present  . Feeling of Stress : Only a little  Social Connections: Moderately Isolated  . Frequency of Communication with Friends and Family: More than three times a week  . Frequency of Social Gatherings with Friends and Family: Once a week  . Attends Religious Services: Never  . Active Member of Clubs or Organizations: No  . Attends Archivist Meetings: Never  . Marital Status: Married    Tobacco Counseling Counseling given: Not Answered   Clinical Intake:  Pre-visit preparation completed: Yes  Pain : 0-10 Pain Score: 3  Pain Type: Chronic pain Pain Location: Back Pain Descriptors / Indicators: Aching (in right side and hip)     BMI - recorded: 25.51 Nutritional Status: BMI 25 -29 Overweight Nutritional Risks: Nausea/ vomitting/ diarrhea (loose stools 3 times a week) Diabetes: Yes CBG done?: Yes (100) CBG resulted in Enter/ Edit results?: No Did pt. bring in CBG monitor from home?: No  How often do you need to have someone help you when you read instructions, pamphlets, or other written materials from your doctor or pharmacy?: 1 - Never  Diabetic?Nutrition Risk Assessment:  Has the patient had any N/V/D within the last 2 months?  Yes  Does the patient have any non-healing wounds?  No  Has the patient had any unintentional weight loss or weight gain?  No   Diabetes:  Is the patient diabetic?  Yes  If diabetic, was a CBG obtained today?  Yes  Did the patient bring in their glucometer from home?  No  How often do you monitor your CBG's? Daily .   Financial Strains and Diabetes Management:  Are you having any financial strains with the device, your supplies or your medication? No .  Does the patient want to be seen by Chronic Care Management for management of  their diabetes?  No  Would the patient like to be referred to a Nutritionist or for Diabetic Management?  No   Diabetic Exams:  Diabetic Eye Exam: Completed 07/2020 Diabetic Foot Exam: Overdue, Pt has been advised about the importance in completing this exam. Pt is scheduled for diabetic foot exam on next appt.  Interpreter Needed?: No  Information entered by :: Charlott Rakes, LPN   Activities of Daily Living In your present state of health, do you have any difficulty performing the following activities: 09/11/2020 07/29/2020  Hearing? N N  Vision? N Y  Difficulty concentrating or making decisions? N N  Walking or climbing stairs? N Y  Dressing or bathing? N N  Doing errands, shopping? N Y  Conservation officer, nature and eating ? N -  Using the Toilet? N -  Managing your Medications? N -  Managing your Finances? N -  Housekeeping or managing your Housekeeping? N -  Some recent data might be hidden    Patient Care Team: Eulas Post, MD as PCP - General (Family Medicine) [place], Peter M, MD as PCP - Cardiology (Cardiology) Neldon Mc, MD as Surgeon (General Surgery) Maisie Fus, MD (Obstetrics and Gynecology) Viona Gilmore, [facility] as Pharmacist (Pharmacist)  Indicate any recent Medical Services you may have received from other than [facility] providers in the past year (date may be approximate).     Assessment:   This is a routine wellness examination for [place].  Hearing/Vision screen  Hearing Screening   125Hz  250Hz  500Hz  1000Hz  2000Hz  3000Hz  4000Hz  6000Hz  8000Hz   Right ear:           Left ear:           Comments: Pt denies hearing issues  Vision Screening Comments: Pt follows up with Dr Marica Otter for annual eye care   Dietary issues and exercise activities discussed: Current Exercise Habits: The patient does not participate in regular exercise at present (housework and PT exercises)  Goals    . Patient Stated     Walk without using walker and improve balance     . Pharmacy Care Plan     CARE PLAN ENTRY  Current Barriers:  . Chronic Disease Management support, education, and care coordination needs related to Hyperlipidemia, Diabetes, Atrial Fibrillation, Heart Failure, GERD, Hypothyroidism, and low vitamin B12 level, side pain, chronic insomnia   Hyperlipidemia Lipid Panel     Component Value Date/Time   CHOL 94 06/23/2019 0825   CHOL 135 04/26/2017 0840   TRIG 70.0 06/23/2019 0825   HDL 35.20 (L) 06/23/2019 0825   HDL 45 04/26/2017 0840   CHOLHDL 3 06/23/2019 0825   VLDL 14.0 06/23/2019 0825   LDLCALC 45 06/23/2019 0825   LDLCALC 70 04/26/2017 0840   LABVLDL 20 04/26/2017 0840   . Pharmacist Clinical Goal(s): o Over the next 120 days, patient will work with PharmD and providers to maintain LDL goal < 70 . Current regimen:  o No medications  . Patient self care activities - Over the next 120 days, patient will: o Continue without medications  Diabetes . Pharmacist Clinical Goal(s): o Over the next 120 days, patient will work with PharmD and providers to achieve A1c goal <7% . Current regimen:   Metformin ER 500mg , 1 tablet twice daily  Tresiba Flextouch, inject 18 units once daily at 10 PM . Interventions: o We discussed how to recognize and treat signs of hypoglycemia  . Patient self care activities - Over the next 120 days, patient will: o Check blood sugar twice daily, document, and provide at future appointments o Contact provider with any episodes of hypoglycemia  Atrial fibrillation .  Pharmacist Clinical Goal(s) o Over the next 120 days, patient will work with PharmD and providers to maintain in normal sinus rhythm.  . Current regimen:   metprolol succinate (Toprol XL) 25mg , 1 tablet once daily   apixaban (Eliquis) 5mg  ,1 tablet twice daily  . Interventions: o We discussed: monitoring for signs and symptoms for bleeding (coughing up blood, prolonged nose bleeds, black, tarry stools). . Patient self care activities  - Over the next 120 days, patient will: o Continue current medications as instructed.   Heart failure . Pharmacist Clinical Goal(s) o Over the next 120 days, patient will work with PharmD and providers to maintain weight stable and minimize fluid retention. . Current regimen:   Metoprolol succinate (Toprol XL) 25mg , 1 tablet once daily   Torsemide 10mg , 1 tablet once daily  . Interventions: o Recommended weighing herself daily. . Patient self care activities o Patient will continue current medications as instructed.   Hypothyroidism . Pharmacist Clinical Goal(s) o Over the next 120 days, patient will work with PharmD and providers to maintain TSH: 0.450 - 4.500 uIU/mL . Current regimen:  o Levothyroxine 31mcg, 1 tablet once daily  . Interventions: o We discussed  administration of levothyroxine on an empty stomach, at least 30 minutes before first meal  . Patient self care activities o Patient will continue current medication.   GERD  . Pharmacist Clinical Goal(s) o Over the next 120 days, patient will work with PharmD and providers to minimize acid reflux symptoms.  . Current regimen:  o Omeprazole 20mg , 1 capsule twice daily  . Patient self care activities o Patient will continue current medication.   Low vitamin B12 . Pharmacist Clinical Goal(s) o Over the next 120 days, patient will work with PharmD and providers to maintain levels: 211 - 911 pg/mL . Current regimen:  o Vitamin B12 (cyanocobalamin) 1000 units, 1 tablet once daily  . Interventions: o We discussed medications that can decrease vitamin B-12 absorption (metformin and long-term intake of omeprazole).  . Patient self care activities o Patient will continue current medication as instructed.   Side pain . Pharmacist Clinical Goal(s) o Over the next 120 days, patient will work with PharmD and providers to minimize pain  . Current regimen:  o APAP 500mg , 1 tablets every six hours as needed for mild pain or  moderate pain . Interventions: o  The maximum daily dose of acetaminophen was discussed with the patient. She was encouraged not to exceed 4,000 mg of acetaminophen during a 24 hour period and was asked to keep in mind that acetaminophen can also be found in many over-the-counter cold medications as well as narcotics that may be given for pain. . Patient self care activities o Patient will continue current medications as instructed.  Insomnia . Pharmacist Clinical Goal(s) o Over the next 120 days, patient will work with PharmD and providers to improve sleep . Current regimen:  o Trazodone 50mg , 0.5 (one-half) to 1 tablet at bedtime as needed for sleep . Interventions: o Discussed practicing good sleep hygiene by setting a sleep schedule and maintaining it, avoid excessive napping, following a nightly routine, avoiding screen time for 30-60 minutes before going to bed, and making the bedroom a cool, quiet and dark space . Patient self care activities o Patient will continue current medications as instructed.   Medication management . Pharmacist Clinical Goal(s): o Over the next 120 days, patient will work with PharmD and providers to maintain optimal medication adherence . [facility] pharmacy[facility]  . Interventions o Comprehensive medication review performed. o Utilize [facility] pharmacy for medication synchronization, packaging and delivery . Patient self care activities - Over the next 120 days, patient will: o Take medications as prescribed o Report any questions or concerns to PharmD and/or provider(s)  Please see past updates related to this goal by clicking on the "Past Updates" button in the selected goal        Depression Screen PHQ 2/9 Scores 09/11/2020 06/23/2019 12/21/2017 10/17/2015 09/23/2015 08/28/2014 12/19/2013  PHQ - 2 Score 0 0 0 0 0 0 0    Fall Risk Fall Risk  09/11/2020 02/14/2020 12/21/2017 10/17/2015 09/23/2015  Falls in the past year? 1 1 No No No  Number falls in  past yr: 1 0 - - -  Injury with Fall? 1 1 - - -  Comment fell on face - - - -  Risk for fall due to : Impaired vision;Impaired mobility;Impaired balance/gait - - - -  Follow up Falls prevention discussed - - - -    FALL RISK PREVENTION PERTAINING TO THE HOME:  Any stairs in or around the home? Yes  If so, are there any without handrails? No  Home free of loose throw rugs in walkways, pet beds, electrical cords, etc? Yes  Adequate lighting in your home to reduce risk of falls? Yes   ASSISTIVE DEVICES UTILIZED TO PREVENT FALLS:  Life alert? No  Use of a cane, walker or w/c? Yes  Grab bars in the bathroom? Yes  Shower chair or bench in shower? No  Elevated toilet seat or a handicapped toilet? No   TIMED UP AND GO:  Was the test performed? No     Cognitive Function:     6CIT Screen 09/11/2020  What Year? 0 points  What month? 0 points  Count back from 20 0 points  Months in reverse 0 points  Repeat phrase 4 points    Immunizations Immunization History  Administered Date(s) Administered  . Fluad Quad(high Dose 65+) 05/02/2019, 06/19/2020  . Influenza Split 07/27/2011  . Influenza, High Dose Seasonal PF 05/01/2015, 04/22/2016, 04/22/2017  . Influenza,inj,Quad PF,6+ Mos 04/21/2013, 05/07/2014, 03/28/2018  . Influenza,inj,quad, With Preservative 04/05/2017  . Moderna Sars-Covid-2 Vaccination 10/20/2019, 11/03/2019  . Pneumococcal Conjugate-13 03/03/2016  . Tdap 08/28/2014    TDAP status: Up to date  Flu Vaccine status: Up to date  Pneumococcal vaccine status: Up to date  Covid-19 vaccine status: Completed vaccines  Qualifies for Shingles Vaccine? Yes   Zostavax completed No   Shingrix Completed?: No.    Education has been provided regarding the importance of this vaccine. Patient has been advised to call insurance company to determine out of pocket expense if they have not yet received this vaccine. Advised may also receive vaccine at local pharmacy or Health Dept.  Verbalized acceptance and understanding.  Screening Tests Health Maintenance  Topic Date Due  . URINE MICROALBUMIN  04/07/2014  . FOOT EXAM  08/29/2015  . PNA vac Low Risk Adult (2 of 2 - PPSV23) 03/03/2017  . COVID-19 Vaccine (3 - Inadvertent risk 4-dose series) 12/01/2019  . HEMOGLOBIN A1C  01/26/2021  . OPHTHALMOLOGY EXAM  07/30/2021  . TETANUS/TDAP  08/28/2024  . INFLUENZA VACCINE  Completed  . DEXA SCAN  Completed  . HPV VACCINES  Aged Out    Health Maintenance  Health Maintenance Due  Topic Date Due  . URINE MICROALBUMIN  04/07/2014  . FOOT EXAM  08/29/2015  . PNA vac Low Risk  Adult (2 of 2 - PPSV23) 03/03/2017  . COVID-19 Vaccine (3 - Inadvertent risk 4-dose series) 12/01/2019    Colorectal cancer screening: No longer required.   Mammogram status: Completed 03/14/20. Repeat every year  Bone Density status: Completed 01/11/13. Results reflect: Bone density results: NORMAL. Repeat every 2 years.   Additional Screening:  Vision Screening: Recommended annual ophthalmology exams for early detection of glaucoma and other disorders of the eye. Is the patient up to date with their annual eye exam?  Yes  Who is the provider or what is the name of the office in which the patient attends annual eye exams? Dr Marica Otter If pt is not established with a provider, would they like to be referred to a provider to establish care? No .   Dental Screening: Recommended annual dental exams for proper oral hygiene  Community Resource Referral / Chronic Care Management: CRR required this visit?  No   CCM required this visit?  No      Plan:     I have personally reviewed and noted the following in the patient's chart:   . Medical and social history . Use of alcohol, tobacco or illicit drugs  . Current medications and supplements . Functional ability and status . Nutritional status . Physical activity . Advanced directives . List of other physicians . Hospitalizations,  surgeries, and ER visits in previous 12 months . Vitals . Screenings to include cognitive, depression, and falls . Referrals and appointments  In addition, I have reviewed and discussed with patient certain preventive protocols, quality metrics, and best practice recommendations. A written personalized care plan for preventive services as well as general preventive health recommendations were provided to patient.     Willette Brace, LPN   03/08/8100   Nurse Notes: None

## 2020-09-11 NOTE — Patient Instructions (Addendum)
Julie Norton , Thank you for taking time to come for your Medicare Wellness Visit. I appreciate your ongoing commitment to your health goals. Please review the following plan we discussed and let me know if I can assist you in the future.   Screening recommendations/referrals: Colonoscopy: Done 03/24/17 Mammogram: Done 03/14/20 Bone Density: Done 01/11/13 Recommended yearly ophthalmology/optometry visit for glaucoma screening and checkup Recommended yearly dental visit for hygiene and checkup  Vaccinations: Influenza vaccine: Done 06/19/20 Pneumococcal vaccine: Up to date Tdap vaccine: Up to date Shingles vaccine: Shingrix discussed. Please contact your pharmacy for coverage information.    Covid-19:Completed 10/20/19 & 11/03/19  Advanced directives: Please bring a copy of your health care power of attorney and living will to the office at your convenience.  Conditions/risks identified: Walk without using walker   Next appointment: Follow up in one year for your annual wellness visit    Preventive Care 65 Years and Older, Female Preventive care refers to lifestyle choices and visits with your health care provider that can promote health and wellness. What does preventive care include?  A yearly physical exam. This is also called an annual well check.  Dental exams once or twice a year.  Routine eye exams. Ask your health care provider how often you should have your eyes checked.  Personal lifestyle choices, including:  Daily care of your teeth and gums.  Regular physical activity.  Eating a healthy diet.  Avoiding tobacco and drug use.  Limiting alcohol use.  Practicing safe sex.  Taking low-dose aspirin every day.  Taking vitamin and mineral supplements as recommended by your health care provider. What happens during an annual well check? The services and screenings done by your health care provider during your annual well check will depend on your age, overall health,  lifestyle risk factors, and family history of disease. Counseling  Your health care provider may ask you questions about your:  Alcohol use.  Tobacco use.  Drug use.  Emotional well-being.  Home and relationship well-being.  Sexual activity.  Eating habits.  History of falls.  Memory and ability to understand (cognition).  Work and work Statistician.  Reproductive health. Screening  You may have the following tests or measurements:  Height, weight, and BMI.  Blood pressure.  Lipid and cholesterol levels. These may be checked every 5 years, or more frequently if you are over 39 years old.  Skin check.  Lung cancer screening. You may have this screening every year starting at age 6 if you have a 30-pack-year history of smoking and currently smoke or have quit within the past 15 years.  Fecal occult blood test (FOBT) of the stool. You may have this test every year starting at age 2.  Flexible sigmoidoscopy or colonoscopy. You may have a sigmoidoscopy every 5 years or a colonoscopy every 10 years starting at age 58.  Hepatitis C blood test.  Hepatitis B blood test.  Sexually transmitted disease (STD) testing.  Diabetes screening. This is done by checking your blood sugar (glucose) after you have not eaten for a while (fasting). You may have this done every 1-3 years.  Bone density scan. This is done to screen for osteoporosis. You may have this done starting at age 92.  Mammogram. This may be done every 1-2 years. Talk to your health care provider about how often you should have regular mammograms. Talk with your health care provider about your test results, treatment options, and if necessary, the need for more tests. Vaccines  Your health care provider may recommend certain vaccines, such as:  Influenza vaccine. This is recommended every year.  Tetanus, diphtheria, and acellular pertussis (Tdap, Td) vaccine. You may need a Td booster every 10 years.  Zoster  vaccine. You may need this after age 66.  Pneumococcal 13-valent conjugate (PCV13) vaccine. One dose is recommended after age 21.  Pneumococcal polysaccharide (PPSV23) vaccine. One dose is recommended after age 70. Talk to your health care provider about which screenings and vaccines you need and how often you need them. This information is not intended to replace advice given to you by your health care provider. Make sure you discuss any questions you have with your health care provider. Document Released: 07/19/2015 Document Revised: 03/11/2016 Document Reviewed: 04/23/2015 Elsevier Interactive Patient Education  2017 [place] Prevention in the Home Falls can cause injuries. They can happen to people of all ages. There are many things you can do to make your home safe and to help prevent falls. What can I do on the outside of my home?  Regularly fix the edges of walkways and driveways and fix any cracks.  Remove anything that might make you trip as you walk through a door, such as a raised step or threshold.  Trim any bushes or trees on the path to your home.  Use bright outdoor lighting.  Clear any walking paths of anything that might make someone trip, such as rocks or tools.  Regularly check to see if handrails are loose or broken. Make sure that both sides of any steps have handrails.  Any raised decks and porches should have guardrails on the edges.  Have any leaves, snow, or ice cleared regularly.  Use sand or salt on walking paths during winter.  Clean up any spills in your garage right away. This includes oil or grease spills. What can I do in the bathroom?  Use night lights.  Install grab bars by the toilet and in the tub and shower. Do not use towel bars as grab bars.  Use non-skid mats or decals in the tub or shower.  If you need to sit down in the shower, use a plastic, non-slip stool.  Keep the floor dry. Clean up any water that spills on the  floor as soon as it happens.  Remove soap buildup in the tub or shower regularly.  Attach bath mats securely with double-sided non-slip rug tape.  Do not have throw rugs and other things on the floor that can make you trip. What can I do in the bedroom?  Use night lights.  Make sure that you have a light by your bed that is easy to reach.  Do not use any sheets or blankets that are too big for your bed. They should not hang down onto the floor.  Have a firm chair that has side arms. You can use this for support while you get dressed.  Do not have throw rugs and other things on the floor that can make you trip. What can I do in the kitchen?  Clean up any spills right away.  Avoid walking on wet floors.  Keep items that you use a lot in easy-to-reach places.  If you need to reach something above you, use a strong step stool that has a grab bar.  Keep electrical cords out of the way.  Do not use floor polish or wax that makes floors slippery. If you must use wax, use non-skid floor wax.  Do  not have throw rugs and other things on the floor that can make you trip. What can I do with my stairs?  Do not leave any items on the stairs.  Make sure that there are handrails on both sides of the stairs and use them. Fix handrails that are broken or loose. Make sure that handrails are as long as the stairways.  Check any carpeting to make sure that it is firmly attached to the stairs. Fix any carpet that is loose or worn.  Avoid having throw rugs at the top or bottom of the stairs. If you do have throw rugs, attach them to the floor with carpet tape.  Make sure that you have a light switch at the top of the stairs and the bottom of the stairs. If you do not have them, ask someone to add them for you. What else can I do to help prevent falls?  Wear shoes that:  Do not have high heels.  Have rubber bottoms.  Are comfortable and fit you well.  Are closed at the toe. Do not wear  sandals.  If you use a stepladder:  Make sure that it is fully opened. Do not climb a closed stepladder.  Make sure that both sides of the stepladder are locked into place.  Ask someone to hold it for you, if possible.  Clearly mark and make sure that you can see:  Any grab bars or handrails.  First and last steps.  Where the edge of each step is.  Use tools that help you move around (mobility aids) if they are needed. These include:  Canes.  Walkers.  Scooters.  Crutches.  Turn on the lights when you go into a dark area. Replace any light bulbs as soon as they burn out.  Set up your furniture so you have a clear path. Avoid moving your furniture around.  If any of your floors are uneven, fix them.  If there are any pets around you, be aware of where they are.  Review your medicines with your doctor. Some medicines can make you feel dizzy. This can increase your chance of falling. Ask your doctor what other things that you can do to help prevent falls. This information is not intended to replace advice given to you by your health care provider. Make sure you discuss any questions you have with your health care provider. Document Released: 04/18/2009 Document Revised: 11/28/2015 Document Reviewed: 07/27/2014 Elsevier Interactive Patient Education  2017 [employer].

## 2020-09-13 ENCOUNTER — Other Ambulatory Visit: Payer: Self-pay

## 2020-09-13 MED ORDER — TORSEMIDE 10 MG PO TABS: 10.0000 mg | ORAL_TABLET | Freq: Every day | ORAL | 1 refills | Status: DC

## 2020-09-13 NOTE — Progress Notes (Signed)
Cardiology Office Note   Date:  09/16/2020   ID:  Julie Norton, DOB [birth year], MRN [id number]  PCP:  Eulas Post, MD  Cardiologist: Maxfield Gildersleeve Martinique MD  Chief Complaint  Patient presents with  . Follow-up    3 months.  . Headache  . Edema      History of Present Illness: Julie Norton is a 87 y.o. female who presents for follow up of Atrial fibrillation.   She has a past history of hypercholesterolemia. She also has a history of DM on  insulin. Echo in 2015 showed mild MR with normal LV function. She had a normal Myoview study in November 2017.   She was seen by Dr. Elease Hashimoto on 09/20/17. Found to be in Afib with rate 95. Started on Eliquis. Follow up Echo showed mild biatrial enlargement and normal LV function. She  noted some fatigue, decreased energy and leg swelling. Some dyspnea and chest tightness. Was started on lasix with improvement of these symptoms. She subsequently underwent TEE guided DCCV on 10/28/17 with return to NSR.   Was seen in early September 2020 with  increased leg swelling for 2 months. Noted SOB going up stairs. Was found to be in Afib with rate 105. Toprol XL was added for rate control. Lasix was increased.  We did try her on Flecainide but this resulted in severe nausea and diarrhea and was discontinued.   She was seen in the ED on 03/27/19 with complaints of epigastric pain/abdominal pain. Mildly elevated lipase. Other labs OK. Ecg in Afib with rate 80. CXR and CT abdomen were normal.  She subsequently underwent successful DCCV on 04/06/19. Seen back on October 27 and was in NSR. Dr Burchette's note of Dec 18 thought she was back in Afib but no Ecg done. Rate controlled. When seen in our office she was in Afib.  She was started on amiodarone  at 400 mg daily. She underwent repeat DCCV on March 5 successfully. Due to some nausea amiodarone was reduced to 200 mg daily.   Was seen by Dr Elease Hashimoto in October with increased LE edema. Was ultimately  switched to torsemide with  improvement. She has planned left breast lumpectomy but this has been postponed a couple of times. She had several teeth extracted so is eating a soft diet.  No dyspnea, orthopnea, PND or palpitations. She did develop side effects on amiodarone and this was discontinued in December. She reports feeling much better now. She does have balance problems and is using a cane or walker now.   Repeat Echo showed normal LV function. There was increased RA size and TR. BNP was 180. UA negative for proteinuria.      Past Medical History:  Diagnosis Date  . Abdominal adhesions   . Abdominal gas pain    suspected -- may be related to intermittent right upper quadrant discomfort radiating around her back      . Anemia   . Anginal pain ([place])   . Arthritis   . Atrial fibrillation ([place])   . Breast cancer ([place])   . Breast mass    left breast  . Cancer ([place])    left breast  . Chest pain    intermittent right upper quadrant discomfort radiating around her back      . Complication of anesthesia   . Diabetes mellitus without complication ([place])    Type II  . Dyspepsia   . Dyspnea   . GERD (gastroesophageal reflux disease)   .  H/O: hysterectomy 1978  . History of cardioversion   . Hypercholesteremia   . Hypothyroidism   . Mitral insufficiency   . Personal history of radiation therapy   . Pneumonia   . PONV (postoperative nausea and vomiting)   . Right bundle branch block (RBBB)   . Tendinitis    of the right arm and shoulder  . Torn tendon    right shoulder    Past Surgical History:  Procedure Laterality Date  . ABDOMINAL HYSTERECTOMY    . APPENDECTOMY  1978  . BLADDER REPAIR    . BREAST LUMPECTOMY Left 02/24/2011   central partial mastectomy - dr Margot Chimes  . BREAST REDUCTION SURGERY    . CARDIOVERSION N/A 10/28/2017   Procedure: CARDIOVERSION;  Surgeon: Lelon Perla, MD;  Location: [facility] ENDOSCOPY;  Service: Cardiovascular;  Laterality: N/A;  . CARDIOVERSION  N/A 04/06/2019   Procedure: CARDIOVERSION;  Surgeon: [name], MD;  Location: [facility] ENDOSCOPY;  Service: Cardiovascular;  Laterality: N/A;  . CARDIOVERSION N/A 09/08/2019   Procedure: CARDIOVERSION;  Surgeon: Dorothy Spark, MD;  Location: [place];  Service: Cardiovascular;  Laterality: N/A;  . COLONOSCOPY WITH PROPOFOL N/A 03/24/2017   Procedure: COLONOSCOPY WITH PROPOFOL;  Surgeon: Wilford Corner, MD;  Location: [place];  Service: Endoscopy;  Laterality: N/A;  . ESOPHAGOGASTRODUODENOSCOPY (EGD) WITH PROPOFOL N/A 03/24/2017   Procedure: ESOPHAGOGASTRODUODENOSCOPY (EGD) WITH PROPOFOL;  Surgeon: Wilford Corner, MD;  Location: [place];  Service: Endoscopy;  Laterality: N/A;  . LIPOMA EXCISION Right   . REDUCTION MAMMAPLASTY Bilateral 1998  . RETINAL DETACHMENT SURGERY Left   . SALPINGOOPHORECTOMY    . TEE WITHOUT CARDIOVERSION N/A 10/28/2017   Procedure: TRANSESOPHAGEAL ECHOCARDIOGRAM (TEE);  Surgeon: Lelon Perla, MD;  Location: [facility] ENDOSCOPY;  Service: Cardiovascular;  Laterality: N/A;  . TONSILLECTOMY AND ADENOIDECTOMY  1950   at 87 years of age     Current Outpatient Medications  Medication Sig Dispense Refill  . acetaminophen (TYLENOL) 500 MG tablet Take 1,000 mg by mouth every 6 (six) hours as needed for mild pain or moderate pain.     Marland Kitchen apixaban (ELIQUIS) 5 MG TABS tablet Take 1 tablet (5 mg total) by mouth 2 (two) times daily. 180 tablet 1  . CALCIUM PO Take 1 tablet by mouth daily.    . Cholecalciferol ([employer] D3) 125 MCG (5000 UT) capsule Take 1 capsule (5,000 Units total) by mouth daily. 90 capsule 1  . glucose blood (ONE TOUCH ULTRA TEST) test strip Check once daily. E11.40 100 each 11  . insulin degludec (TRESIBA FLEXTOUCH) 100 UNIT/ML FlexTouch Pen INJECT 24 UNITS TOTAL INTO THE SKIN DAILY AT 10PM (Patient taking differently: INJECT 17 UNITS TOTAL INTO THE SKIN DAILY AT 10PM) 100 mL 1  . Insulin Pen Needle 29G X 5MM MISC Use once daily 100 each 3   . levothyroxine (SYNTHROID) 50 MCG tablet Take 1 tablet (50 mcg total) by mouth daily. 90 tablet 1  . loratadine (CLARITIN) 10 MG tablet Take 10 mg by mouth daily as needed for allergies.    . metFORMIN (GLUCOPHAGE-XR) 500 MG 24 hr tablet Take 1 tablet (500 mg total) by mouth 2 (two) times daily. 180 tablet 1  . metoprolol succinate (TOPROL-XL) 25 MG 24 hr tablet Take 1 tablet (25 mg total) by mouth daily. 90 tablet 3  . omeprazole (PRILOSEC) 20 MG capsule Take 1 capsule (20 mg total) by mouth 2 (two) times daily. 180 capsule 1  . ONE TOUCH ULTRA TEST test strip CHECK ONCE  A DAY 50 each 3  . potassium chloride (KLOR-CON) 10 MEQ tablet Take 1 tablet (10 mEq total) by mouth daily. 90 tablet 1  . torsemide (DEMADEX) 10 MG tablet Take 1 tablet (10 mg total) by mouth daily. 30 tablet 1  . traZODone (DESYREL) 50 MG tablet Take 0.5-1 tablets (25-50 mg total) by mouth at bedtime as needed. for sleep 90 tablet 1  . vitamin B-12 (CYANOCOBALAMIN) 1000 MCG tablet Take 1 tablet (1,000 mcg total) by mouth daily. 90 tablet 3   No current facility-administered medications for this visit.    Allergies:   Bactrim, Dilaudid [hydromorphone hcl], Hydrocodone, Invokana [canagliflozin], and Macrodantin    Social History:  The patient  reports that she has never smoked. She has never used smokeless tobacco. She reports that she does not drink alcohol and does not use drugs.   Family History:  The patient's family history includes Aortic aneurysm in her mother and sister; Cancer in her mother; Diabetes in her mother; Heart attack in her mother; Hypertension in her mother; Pneumonia in her father; Stroke in her mother.    ROS:  Please see the history of present illness.   Otherwise, review of systems are positive for none.   All other systems are reviewed and negative.    PHYSICAL EXAM: VS:  BP (!) 128/54 (BP Location: Left Arm, Patient Position: Sitting, Cuff Size: Normal)   Pulse (!) 54   Ht 5\' 1"  (1.549 m)    Wt 136 lb (61.7 kg)   BMI 25.70 kg/m  , BMI Body mass index is 25.7 kg/m. GENERAL:  Well appearing WF  In NAD HEENT:  PERRL, EOMI, sclera are clear. Oropharynx is clear. NECK:  No jugular venous distention, carotid upstroke brisk and symmetric, no bruits, no thyromegaly or adenopathy LUNGS:  Clear to auscultation bilaterally CHEST:  Unremarkable HEART:  RRR,  PMI not displaced or sustained,S1 and S2 within normal limits, no S3, no S4: no clicks, no rubs, there is a soft 1-2/6 SEM ABD:  Soft, nontender. BS +, no masses or bruits. No hepatomegaly, no splenomegaly EXT:  2 + pulses throughout, Tr edema, no cyanosis no clubbing SKIN:  Warm and dry.  No rashes NEURO:  Alert and oriented x 3. Cranial nerves II through XII intact. PSYCH:  Cognitively intact  EKG:  EKG is  Not ordered today.   Recent Labs: 12/11/2019: ALT 32 02/19/2020: Hemoglobin 9.4; Platelets 158 04/22/2020: TSH 2.08 06/13/2020: BNP 179.9; BUN 13; Creatinine, Ser 0.81; Potassium 5.0; Sodium 141    Lipid Panel    Component Value Date/Time   CHOL 94 06/23/2019 0825   CHOL 135 04/26/2017 0840   TRIG 70.0 06/23/2019 0825   HDL 35.20 (L) 06/23/2019 0825   HDL 45 04/26/2017 0840   CHOLHDL 3 06/23/2019 0825   VLDL 14.0 06/23/2019 0825   LDLCALC 45 06/23/2019 0825   LDLCALC 70 04/26/2017 0840      Wt Readings from Last 3 Encounters:  09/16/20 136 lb (61.7 kg)  07/29/20 135 lb (61.2 kg)  06/19/20 139 lb 11.2 oz (63.4 kg)      Lexiscan myoview 05/26/16:Study Highlights    Nuclear stress EF: 72%.  There was no ST segment deviation noted during stress.  This is a low risk study.  The left ventricular ejection fraction is hyperdynamic (>65%).   1. Small, mild mid-anteroseptal perfusion defect.  This defect was actually more intense at rest than with stress.  No ischemia.  Suspect attenuation.  2. EF  72% with normal wall motion.  3. Overall low risk study.     Echo 09/24/17: Study Conclusions  - Left  ventricle: The cavity size was normal. Systolic function was   normal. The estimated ejection fraction was in the range of 55%   to 60%. Wall motion was normal; there were no regional wall   motion abnormalities. - Mitral valve: There was mild regurgitation. - Left atrium: The atrium was mildly dilated. - Right atrium: The atrium was mildly dilated. - Atrial septum: No defect or patent foramen ovale was identified. - Pulmonary arteries: Systolic pressure was mildly increased. PA   peak pressure: 37 mm Hg (S).   Echo 06/24/20: IMPRESSIONS    1. Left ventricular ejection fraction, by estimation, is 60 to 65%. The  left ventricle has normal function. The left ventricle has no regional  wall motion abnormalities. Left ventricular diastolic parameters are  consistent with Grade II diastolic  dysfunction (pseudonormalization).  2. Right ventricular systolic function is mildly reduced. The right  ventricular size is mildly enlarged. There is normal pulmonary artery  systolic pressure.  3. Right atrial size was severely dilated.  4. The mitral valve is normal in structure. Trivial mitral valve  regurgitation.  5. Tricuspid valve regurgitation is moderate.  6. The aortic valve is tricuspid. There is mild thickening of the aortic  valve. Aortic valve regurgitation is not visualized. No aortic stenosis is  present.  7. Aortic dilatation noted. There is mild dilatation of the aortic root,  measuring 36 mm. There is mild dilatation of the ascending aorta,  measuring 37 mm.  8. The inferior vena cava is dilated in size with <50% respiratory  variability, suggesting right atrial pressure of 15 mmHg.   Comparison(s): Compared to prior study in 2019, there is now moderate TR.  Otherwise there is no significant change   ASSESSMENT AND PLAN:  1. Atrial fibrillation. Recurrent. [place] vasc score of 4. On Eliquis. Rate controlled on Toprol XL.  S/p TEE guided DCCV in April 2019. Repeat  DCCV on April 06, 2019 with ERAD. She was intolerant of Flecainide. She was symptomatic with her Afib so was started on amiodarone. S/p  successful repeat DCCV on September 08, 2019. She is maintaining NSR on exam.  Amiodarone was discontinued in December due to side effects. Feeling much better now and is till in NSR.  2.  Chronic diastolic CHF.  This has improved with Torsemide. Encourage sodium restriction, elevation of feet and compression hose. Echo was unremarkable except for moderate TR.   3. Hypercholesterolemia. On low dose lipitor.   4. Diabetes mellitus- now on insulin.    5. History of Breast CA. Plan for left breast lumpectomy. She is cleared from a cardiac standpoint. Would need to hold Eliquis 2 days prior  Follow up in 6 months.  Signed, Bruin Bolger Martinique MD, [facility]   09/16/2020 9:48 AM    [place]

## 2020-09-16 ENCOUNTER — Encounter: Payer: Self-pay | Admitting: Cardiology

## 2020-09-16 ENCOUNTER — Other Ambulatory Visit: Payer: Self-pay

## 2020-09-16 ENCOUNTER — Ambulatory Visit: Payer: Medicare Other | Admitting: Cardiology

## 2020-09-16 VITALS — BP 128/54 | HR 54 | Ht 61.0 in | Wt 136.0 lb

## 2020-09-16 DIAGNOSIS — I5032 Chronic diastolic (congestive) heart failure: Secondary | ICD-10-CM

## 2020-09-16 DIAGNOSIS — E7800 Pure hypercholesterolemia, unspecified: Secondary | ICD-10-CM

## 2020-09-16 DIAGNOSIS — I480 Paroxysmal atrial fibrillation: Secondary | ICD-10-CM

## 2020-09-16 DIAGNOSIS — I4510 Unspecified right bundle-branch block: Secondary | ICD-10-CM | POA: Diagnosis not present

## 2020-09-18 ENCOUNTER — Other Ambulatory Visit: Payer: Self-pay | Admitting: Family Medicine

## 2020-09-18 ENCOUNTER — Other Ambulatory Visit: Payer: Self-pay | Admitting: Cardiology

## 2020-09-19 ENCOUNTER — Telehealth: Payer: Self-pay | Admitting: Family Medicine

## 2020-09-19 NOTE — Telephone Encounter (Signed)
Pt is calling in stating that she has been having diarrhea 3-4 times a week and would like to see if she can come off/decreae dosage of her metformin (GLUCOPHAGE XR) 500 MG and wanted to also mention that she is taking insulin as well.  Pt would like to have a call back.

## 2020-09-20 NOTE — Telephone Encounter (Signed)
Try to reduce to one daily

## 2020-09-20 NOTE — Telephone Encounter (Signed)
I spoke with pt. She will try 1 tablet daily & let us know if she doesn't improve.

## 2020-09-20 NOTE — Telephone Encounter (Signed)
Please advise 

## 2020-09-23 ENCOUNTER — Telehealth: Payer: Self-pay | Admitting: Pharmacist

## 2020-09-24 NOTE — Chronic Care Management (AMB) (Signed)
I reached out to the patient about her recent medication delivery. [facility] had sent her three medications from their pharmacy. Unfortunately, [facility] was not able to fill these three for her packaging this time. She understood and will take those medications. [name] thought that though previous prescriptions were canceled through the mail order. I called [facility] this morning and canceled all future deliveries of medications. I called the patient back and informed her.    Julie Norton, [place] Assistant [phone]

## 2020-09-26 NOTE — Chronic Care Management (AMB) (Signed)
Julie Norton received her first adherence package. She had some questions about her medications. She was a little confused about which medications she had received. We discussed her medications, and she got a good understanding of how they look and what time she should take them. She states that she still has eight Eliquis remaining, and she would like to take the next month.  I will inform the pharmacy.    Maia Breslow, [place] Assistant [phone]

## 2020-09-27 DIAGNOSIS — M5136 Other intervertebral disc degeneration, lumbar region: Secondary | ICD-10-CM | POA: Diagnosis not present

## 2020-09-30 DIAGNOSIS — N6092 Unspecified benign mammary dysplasia of left breast: Secondary | ICD-10-CM | POA: Diagnosis not present

## 2020-10-03 ENCOUNTER — Other Ambulatory Visit: Payer: Self-pay | Admitting: Surgery

## 2020-10-03 DIAGNOSIS — N6092 Unspecified benign mammary dysplasia of left breast: Secondary | ICD-10-CM

## 2020-10-11 ENCOUNTER — Other Ambulatory Visit: Payer: Self-pay

## 2020-10-11 ENCOUNTER — Ambulatory Visit
Admission: RE | Admit: 2020-10-11 | Discharge: 2020-10-11 | Disposition: A | Discharge status: Home / Self Care | Payer: Medicare Other | Source: Ambulatory Visit | Attending: Surgery | Admitting: Surgery

## 2020-10-11 DIAGNOSIS — N6092 Unspecified benign mammary dysplasia of left breast: Secondary | ICD-10-CM

## 2020-10-11 DIAGNOSIS — R922 Inconclusive mammogram: Secondary | ICD-10-CM | POA: Diagnosis not present

## 2020-10-14 ENCOUNTER — Telehealth: Payer: Self-pay | Admitting: Pharmacist

## 2020-10-14 NOTE — Chronic Care Management (AMB) (Addendum)
Chronic Care Management Pharmacy Assistant   Name: IDALIA ALLBRITTON  MRN: [id number] DOB: May 24, 1930  Reason for Encounter: Medication Review   Recent office visits:  None  Recent consult visits:  03.25.2022 [place]  [facility] visits:  None in previous 6 months  Medications: Outpatient Encounter Medications as of 10/14/2020  Medication Sig   acetaminophen (TYLENOL) 500 MG tablet Take 1,000 mg by mouth every 6 (six) hours as needed for mild pain or moderate pain.    apixaban (ELIQUIS) 5 MG TABS tablet Take 1 tablet (5 mg total) by mouth 2 (two) times daily.   CALCIUM PO Take 1 tablet by mouth daily.   Cholecalciferol (CVS D3) 125 MCG (5000 UT) capsule Take 1 capsule (5,000 Units total) by mouth daily.   glucose blood (ONE TOUCH ULTRA TEST) test strip Check once daily. E11.40   insulin degludec (TRESIBA FLEXTOUCH) 100 UNIT/ML FlexTouch Pen INJECT 24 UNITS TOTAL INTO THE SKIN DAILY AT 10PM (Patient taking differently: INJECT 17 UNITS TOTAL INTO THE SKIN DAILY AT 10PM)   Insulin Pen Needle 29G X 5MM MISC Use once daily   levothyroxine (SYNTHROID) 50 MCG tablet Take 1 tablet (50 mcg total) by mouth daily.   loratadine (CLARITIN) 10 MG tablet Take 10 mg by mouth daily as needed for allergies.   metFORMIN (GLUCOPHAGE-XR) 500 MG 24 hr tablet Take 1 tablet (500 mg total) by mouth 2 (two) times daily.   metoprolol succinate (TOPROL-XL) 25 MG 24 hr tablet Take 1 tablet (25 mg total) by mouth daily.   omeprazole (PRILOSEC) 20 MG capsule Take 1 capsule (20 mg total) by mouth 2 (two) times daily.   ONE TOUCH ULTRA TEST test strip CHECK ONCE A DAY   potassium chloride (KLOR-CON) 10 MEQ tablet Take 1 tablet (10 mEq total) by mouth daily.   torsemide (DEMADEX) 10 MG tablet Take 1 tablet (10 mg total) by mouth daily.   traZODone (DESYREL) 50 MG tablet TAKE 1/2 TO 1 TABLET BY MOUTH AT BEDTIME AS NEEDED FOR SLEEP   vitamin B-12 (CYANOCOBALAMIN) 1000 MCG tablet Take 1  tablet (1,000 mcg total) by mouth daily.   No facility-administered encounter medications on file as of 10/14/2020.   Reviewed chart for medication changes ahead of medication coordination call.  BP Readings from Last 3 Encounters:  09/16/20 (!) 128/54  07/29/20 140/70  06/19/20 (!) 122/50    Lab Results  Component Value Date   HGBA1C 7.8 (A) 07/29/2020    Patient obtains medications through Adherence Packaging  30 Days  Last adherence delivery included:  Apixaban (ELIQUIS) 5 mg: One tablet at breakfast and one at dinner Calcium + D3 600-800: one tablet at dinner Cholecalciferol (CVS D3) 125 mcg: one capsule at breakfast Cyanocobalamin (B-12) 1000 mcg: one tablet at dinner Levothyroxine (SYNTHROID) 50 mcg: One tablet before breakfast Metoprolol succinate (TOPROL-XL) 25 MG 24hr: one tablet at breakfast Omeprazole (PRILOSEC) 20 mg: one capsule at breakfast and one at dinner Potassium chloride 10 mEq: one tablet at breakfast Torsemide (DEMADEX) 10 mg: one tablet at breakfast Trazodone (DESYREL) 50 mg: one tablet at bedtime  Patient declined the following medicaiton last month due to additional supply on hand. Metformin (GLUCOPHAGE-XR) 500 MG 24 hr tablet one tablet at breakfast and one tablet at dinner (mail order sent 90 DS on 09/10/20) Insulin degludec (TRESIBA FLEXTOUCH) 100 UNIT/ML FlexTouch Pen Levothyroxine (SYNTHROID) 50 mcg: One tablet before breakfast Omeprazole (PRILOSEC) 20 mg: one capsule at breakfast and one at dinner  Patient is due for next adherence delivery on: 10/23/2020 Called patient and reviewed medications and coordinated delivery. This delivery to include: Apixaban (ELIQUIS) 5 mg: One tablet at breakfast and one at dinner ( 20 days) Calcium + D3 600-800: one tablet at dinner Cholecalciferol (CVS D3) 125 mcg: one capsule at breakfast Cyanocobalamin (B-12) 1000 mcg: one tablet at dinner Metoprolol succinate (TOPROL-XL) 25 MG 24hr: one tablet at  breakfast Potassium chloride 10 mEq: one tablet at breakfast Torsemide (DEMADEX) 10 mg: one tablet at breakfast Insulin degludec (TRESIBA FLEXTOUCH) 100 UNIT/ML FlexTouch Pen  I spoke with the patient and reviewed medications. There are no changes in medications currently. The patient declined these medications this month due to PRN use/additional supply on hand. The patient is taking the following medications:  Metformin (GLUCOPHAGE-XR) 500 MG 24 hr tablet one tablet at breakfast and one tablet at dinner (mail order sent 90 DS on 09/10/20) Levothyroxine (SYNTHROID) 50 mcg: One tablet before breakfast (mail order sent 90 DS 03.16.22) Omeprazole (PRILOSEC) 20 mg: one capsule at breakfast and one at dinner(mail order sent 90 DS 03.16.22) Trazodone (DESYREL) 50 mg: one tablet at bedtime (30 days supply not used from last delivery)   She currently does not need refills. Confirmed delivery date of 10/23/2020, advised patient that pharmacy will contact them the morning of delivery.  Star Rating Drugs:  Dispensed Quantity Pharmacy  Metformin 03.03.2022 90 [facility]   Arlis Porta Revonda Standard, [place] [phone]

## 2020-10-29 ENCOUNTER — Ambulatory Visit (INDEPENDENT_AMBULATORY_CARE_PROVIDER_SITE_OTHER): Payer: Medicare Other | Admitting: Family Medicine

## 2020-10-29 ENCOUNTER — Encounter: Payer: Self-pay | Admitting: Family Medicine

## 2020-10-29 ENCOUNTER — Other Ambulatory Visit: Payer: Self-pay

## 2020-10-29 VITALS — BP 124/72 | HR 65 | Temp 98.1°F | Wt 133.2 lb

## 2020-10-29 DIAGNOSIS — E1165 Type 2 diabetes mellitus with hyperglycemia: Secondary | ICD-10-CM

## 2020-10-29 DIAGNOSIS — B373 Candidiasis of vulva and vagina: Secondary | ICD-10-CM

## 2020-10-29 DIAGNOSIS — I4819 Other persistent atrial fibrillation: Secondary | ICD-10-CM

## 2020-10-29 DIAGNOSIS — G629 Polyneuropathy, unspecified: Secondary | ICD-10-CM | POA: Diagnosis not present

## 2020-10-29 DIAGNOSIS — L853 Xerosis cutis: Secondary | ICD-10-CM | POA: Diagnosis not present

## 2020-10-29 DIAGNOSIS — B3731 Acute candidiasis of vulva and vagina: Secondary | ICD-10-CM

## 2020-10-29 MED ORDER — FLUCONAZOLE 150 MG PO TABS: 150.0000 mg | ORAL_TABLET | Freq: Once | ORAL | 0 refills | Status: AC

## 2020-10-29 NOTE — Progress Notes (Signed)
Established Patient Office Visit  Subjective:  Patient ID: Julie Norton, female    DOB: March 07, 1930  Age: 87 y.o. MRN: [id number]  CC:  Chief Complaint  Patient presents with  . Follow-up    HPI Julie Norton presents for medical follow-up.  Her chronic problems include history of atrial fibrillation, hypothyroidism, type 2 diabetes, peripheral neuropathy, osteoarthritis, remote history of breast cancer.  She has had some recent back issues and had cortisone injection per orthopedist few weeks ago.  She is requesting that we not check her A1c today because of that.  This is correlated with high readings in the past.  She is currently on metformin once daily and is on Tresiba insulin.  She increased her [name] to a dosage of 24 units daily after she has some fastings over 200 after her cortisone injection.  Past couple of days her fasting sugars have been around 120.  Last A1c was 7.8%.  No recent hypoglycemic symptoms.  She does have some sharp fleeting neuropathic type pains in her extremities especially her feet occasionally.  She does not feel this warrants medication at this time.  Not keeping her weight at night.  She has had some yeast vaginitis symptoms with itching and occasional discharge.  This is cleared promptly in the past with fluconazole and she is requesting refill.  She also has some pruritus involving her left shoulder and left upper back region.  Has not applied anything topically to this.  She has atrial fibrillation.  She is on Eliquis 5 mg twice daily.  Her weight is right at 60 kg.  Recent creatinine was normal.  Past Medical History:  Diagnosis Date  . Abdominal adhesions   . Abdominal gas pain    suspected -- may be related to intermittent right upper quadrant discomfort radiating around her back      . Anemia   . Anginal pain ([place])   . Arthritis   . Atrial fibrillation ([place])   . Breast cancer ([place])   . Breast mass    left breast  . Cancer ([place])     left breast  . Chest pain    intermittent right upper quadrant discomfort radiating around her back      . Complication of anesthesia   . Diabetes mellitus without complication ([place])    Type II  . Dyspepsia   . Dyspnea   . GERD (gastroesophageal reflux disease)   . H/O: hysterectomy 1978  . History of cardioversion   . Hypercholesteremia   . Hypothyroidism   . Mitral insufficiency   . Personal history of radiation therapy   . Pneumonia   . PONV (postoperative nausea and vomiting)   . Right bundle branch block (RBBB)   . Tendinitis    of the right arm and shoulder  . Torn tendon    right shoulder    Past Surgical History:  Procedure Laterality Date  . ABDOMINAL HYSTERECTOMY    . APPENDECTOMY  1978  . BLADDER REPAIR    . BREAST LUMPECTOMY Left 02/24/2011   central partial mastectomy - dr Margot Chimes  . BREAST REDUCTION SURGERY    . CARDIOVERSION N/A 10/28/2017   Procedure: CARDIOVERSION;  Surgeon: Lelon Perla, MD;  Location: [facility] ENDOSCOPY;  Service: Cardiovascular;  Laterality: N/A;  . CARDIOVERSION N/A 04/06/2019   Procedure: CARDIOVERSION;  Surgeon: [name], MD;  Location: [facility] ENDOSCOPY;  Service: Cardiovascular;  Laterality: N/A;  . CARDIOVERSION N/A 09/08/2019   Procedure: CARDIOVERSION;  Surgeon: Meda Coffee,  Jamse Belfast, MD;  Location: [facility] ENDOSCOPY;  Service: Cardiovascular;  Laterality: N/A;  . COLONOSCOPY WITH PROPOFOL N/A 03/24/2017   Procedure: COLONOSCOPY WITH PROPOFOL;  Surgeon: Wilford Corner, MD;  Location: [place];  Service: Endoscopy;  Laterality: N/A;  . ESOPHAGOGASTRODUODENOSCOPY (EGD) WITH PROPOFOL N/A 03/24/2017   Procedure: ESOPHAGOGASTRODUODENOSCOPY (EGD) WITH PROPOFOL;  Surgeon: Wilford Corner, MD;  Location: [place];  Service: Endoscopy;  Laterality: N/A;  . LIPOMA EXCISION Right   . REDUCTION MAMMAPLASTY Bilateral 1998  . RETINAL DETACHMENT SURGERY Left   . SALPINGOOPHORECTOMY    . TEE WITHOUT CARDIOVERSION N/A 10/28/2017    Procedure: TRANSESOPHAGEAL ECHOCARDIOGRAM (TEE);  Surgeon: Lelon Perla, MD;  Location: [facility] ENDOSCOPY;  Service: Cardiovascular;  Laterality: N/A;  . [place]   at 87 years of age    Family History  Problem Relation Age of Onset  . Pneumonia Father   . Heart attack Mother   . Hypertension Mother   . Stroke Mother   . Diabetes Mother   . Aortic aneurysm Mother        abdominal aortic aneurysm  . Cancer Mother        bladder  . Aortic aneurysm Sister        abdominal aortic aneurysm    Social History   Socioeconomic History  . Marital status: Married    Spouse name: Not on file  . Number of children: Not on file  . Years of education: Not on file  . Highest education level: Not on file  Occupational History  . Not on file  Tobacco Use  . Smoking status: Never Smoker  . Smokeless tobacco: Never Used  Vaping Use  . Vaping Use: Never used  Substance and Sexual Activity  . Alcohol use: No  . Drug use: No  . Sexual activity: Not on file  Other Topics Concern  . Not on file  Social History Narrative  . Not on file   Social Determinants of Health   Financial Resource Strain: Low Risk   . Difficulty of Paying Living Expenses: Not hard at all  Food Insecurity: No Food Insecurity  . Worried About Charity fundraiser in the Last Year: Never true  . Ran Out of Food in the Last Year: Never true  Transportation Needs: No Transportation Needs  . Lack of Transportation (Medical): No  . Lack of Transportation (Non-Medical): No  Physical Activity: Inactive  . Days of Exercise per Week: 0 days  . Minutes of Exercise per Session: 0 min  Stress: No Stress Concern Present  . Feeling of Stress : Only a little  Social Connections: Moderately Isolated  . Frequency of Communication with Friends and Family: More than three times a week  . Frequency of Social Gatherings with Friends and Family: Once a week  . Attends Religious Services: Never  .  Active Member of Clubs or Organizations: No  . Attends Archivist Meetings: Never  . Marital Status: Married  Human resources officer Violence: Not At Risk  . Fear of Current or Ex-Partner: No  . Emotionally Abused: No  . Physically Abused: No  . Sexually Abused: No    Outpatient Medications Prior to Visit  Medication Sig Dispense Refill  . acetaminophen (TYLENOL) 500 MG tablet Take 1,000 mg by mouth every 6 (six) hours as needed for mild pain or moderate pain.     Marland Kitchen apixaban (ELIQUIS) 5 MG TABS tablet Take 1 tablet (5 mg total) by mouth 2 (two)  times daily. 180 tablet 1  . CALCIUM PO Take 1 tablet by mouth daily.    . Cholecalciferol (CVS D3) 125 MCG (5000 UT) capsule Take 1 capsule (5,000 Units total) by mouth daily. 90 capsule 1  . glucose blood (ONE TOUCH ULTRA TEST) test strip Check once daily. E11.40 100 each 11  . insulin degludec (TRESIBA FLEXTOUCH) 100 UNIT/ML FlexTouch Pen INJECT 24 UNITS TOTAL INTO THE SKIN DAILY AT 10PM (Patient taking differently: INJECT 17 UNITS TOTAL INTO THE SKIN DAILY AT 10PM) 100 mL 1  . Insulin Pen Needle 29G X 5MM MISC Use once daily 100 each 3  . levothyroxine (SYNTHROID) 50 MCG tablet Take 1 tablet (50 mcg total) by mouth daily. 90 tablet 1  . loratadine (CLARITIN) 10 MG tablet Take 10 mg by mouth daily as needed for allergies.    . metFORMIN (GLUCOPHAGE-XR) 500 MG 24 hr tablet Take 1 tablet (500 mg total) by mouth 2 (two) times daily. 180 tablet 1  . metoprolol succinate (TOPROL-XL) 25 MG 24 hr tablet Take 1 tablet (25 mg total) by mouth daily. 90 tablet 3  . omeprazole (PRILOSEC) 20 MG capsule Take 1 capsule (20 mg total) by mouth 2 (two) times daily. 180 capsule 1  . ONE TOUCH ULTRA TEST test strip CHECK ONCE A DAY 50 each 3  . potassium chloride (KLOR-CON) 10 MEQ tablet Take 1 tablet (10 mEq total) by mouth daily. 90 tablet 1  . torsemide (DEMADEX) 10 MG tablet Take 1 tablet (10 mg total) by mouth daily. 30 tablet 1  . traZODone (DESYREL) 50 MG  tablet TAKE 1/2 TO 1 TABLET BY MOUTH AT BEDTIME AS NEEDED FOR SLEEP 90 tablet 1  . vitamin B-12 (CYANOCOBALAMIN) 1000 MCG tablet Take 1 tablet (1,000 mcg total) by mouth daily. 90 tablet 3   No facility-administered medications prior to visit.    Allergies  Allergen Reactions  . Bactrim Nausea And Vomiting  . Dilaudid [Hydromorphone Hcl] Other (See Comments)    Unknown  . Hydrocodone Nausea And Vomiting  . Invokana [Canagliflozin] Itching and Other (See Comments)    Yeast Infections   . Macrodantin Nausea And Vomiting    ROS Review of Systems  Constitutional: Negative for fatigue.  Eyes: Negative for visual disturbance.  Respiratory: Negative for cough, chest tightness, shortness of breath and wheezing.   Cardiovascular: Negative for chest pain, palpitations and leg swelling.  Genitourinary: Positive for vaginal discharge. Negative for dysuria.  Neurological: Negative for dizziness, seizures, syncope, weakness, light-headedness and headaches.      Objective:    Physical Exam Vitals reviewed.  Cardiovascular:     Rate and Rhythm: Normal rate and regular rhythm.  Pulmonary:     Effort: Pulmonary effort is normal.     Breath sounds: Normal breath sounds.  Skin:    Comments: Left upper back region is examined.  Her skin is very dry but no specific rash noted.  Neurological:     General: No focal deficit present.     Mental Status: She is alert.     BP 124/72 (BP Location: Left Arm, Patient Position: Sitting, Cuff Size: Normal)   Pulse 65   Temp 98.1 F (36.7 C) (Oral)   Wt 133 lb 3.2 oz (60.4 kg)   SpO2 96%   BMI 25.17 kg/m  Wt Readings from Last 3 Encounters:  10/29/20 133 lb 3.2 oz (60.4 kg)  09/16/20 136 lb (61.7 kg)  07/29/20 135 lb (61.2 kg)     Health  Maintenance Due  Topic Date Due  . URINE MICROALBUMIN  04/07/2014  . FOOT EXAM  08/29/2015  . PNA vac Low Risk Adult (2 of 2 - PPSV23) 03/03/2017  . COVID-19 Vaccine (3 - Inadvertent risk 4-dose  series) 12/01/2019    There are no preventive care reminders to display for this patient.  Lab Results  Component Value Date   TSH 2.08 04/22/2020   Lab Results  Component Value Date   WBC 4.7 02/19/2020   HGB 9.4 (L) 02/19/2020   HCT 31.5 (L) 02/19/2020   MCV 80.8 02/19/2020   PLT 158 02/19/2020   Lab Results  Component Value Date   NA 141 06/13/2020   K 5.0 06/13/2020   CHLORIDE 111 (H) 04/03/2014   CO2 23 06/13/2020   GLUCOSE 178 (H) 06/13/2020   BUN 13 06/13/2020   CREATININE 0.81 06/13/2020   BILITOT 0.4 12/11/2019   ALKPHOS 87 12/11/2019   AST 39 12/11/2019   ALT 32 12/11/2019   PROT 6.4 12/11/2019   ALBUMIN 4.1 12/11/2019   CALCIUM 9.4 06/13/2020   ANIONGAP 8 02/19/2020   GFR 70.88 06/23/2019   Lab Results  Component Value Date   CHOL 94 06/23/2019   Lab Results  Component Value Date   HDL 35.20 (L) 06/23/2019   Lab Results  Component Value Date   LDLCALC 45 06/23/2019   Lab Results  Component Value Date   TRIG 70.0 06/23/2019   Lab Results  Component Value Date   CHOLHDL 3 06/23/2019   Lab Results  Component Value Date   HGBA1C 7.8 (A) 07/29/2020      Assessment & Plan:   #1 type 2 diabetes.  Last A1c 7.8%.  We discussed rechecking A1c today but she has had couple recent cortisone injections and patient requests deferring for a few months because of that.  We will plan 31-month follow-up and recheck A1c then.  Continue close monitoring of her fasting glucoses and adjust Tresiba as instructed  #2 fleeting neuropathic type pains.  Discussed treatment options such as gabapentin or Lyrica but she is not interested at this point as symptoms are relatively mild.  #3 dry skin dermatitis.  Recommend liberal use of moisturizers especially after bathing and may combine with over-the-counter hydrocortisone  #4 history of atrial fibrillation.  Currently appears to be in sinus rhythm.  On Eliquis.  Continue Eliquis at current dosage.  If her weight  drops any further or renal function declines adjust Eliquis to 2.5 mg twice daily  #5 yeast vaginitis symptoms. -Refill fluconazole 150 mg x 1 dose  Meds ordered this encounter  Medications  . fluconazole (DIFLUCAN) 150 MG tablet    Sig: Take 1 tablet (150 mg total) by mouth once for 1 dose.    Dispense:  1 tablet    Refill:  0    Follow-up: Return in about 3 months (around 01/28/2021).    [name], MD

## 2020-10-29 NOTE — Patient Instructions (Signed)
Diabetes Mellitus and Foot Care Foot care is an important part of your health, especially when you have diabetes. Diabetes may cause you to have problems because of poor blood flow (circulation) to your feet and legs, which can cause your skin to:  Become thinner and drier.  Break more easily.  Heal more slowly.  Peel and crack. You may also have nerve damage (neuropathy) in your legs and feet, causing decreased feeling in them. This means that you may not notice minor injuries to your feet that could lead to more serious problems. Noticing and addressing any potential problems early is the best way to prevent future foot problems. How to care for your feet Foot hygiene  Wash your feet daily with warm water and mild soap. Do not use hot water. Then, pat your feet and the areas between your toes until they are completely dry. Do not soak your feet as this can dry your skin.  Trim your toenails straight across. Do not dig under them or around the cuticle. File the edges of your nails with an emery board or nail file.  Apply a moisturizing lotion or petroleum jelly to the skin on your feet and to dry, brittle toenails. Use lotion that does not contain alcohol and is unscented. Do not apply lotion between your toes.   Shoes and socks  Wear clean socks or stockings every day. Make sure they are not too tight. Do not wear knee-high stockings since they may decrease blood flow to your legs.  Wear shoes that fit properly and have enough cushioning. Always look in your shoes before you put them on to be sure there are no objects inside.  To break in new shoes, wear them for just a few hours a day. This prevents injuries on your feet. Wounds, scrapes, corns, and calluses  Check your feet daily for blisters, cuts, bruises, sores, and redness. If you cannot see the bottom of your feet, use a mirror or ask someone for help.  Do not cut corns or calluses or try to remove them with medicine.  If you  find a minor scrape, cut, or break in the skin on your feet, keep it and the skin around it clean and dry. You may clean these areas with mild soap and water. Do not clean the area with peroxide, alcohol, or iodine.  If you have a wound, scrape, corn, or callus on your foot, look at it several times a day to make sure it is healing and not infected. Check for: ? Redness, swelling, or pain. ? Fluid or blood. ? Warmth. ? Pus or a bad smell.   General tips  Do not cross your legs. This may decrease blood flow to your feet.  Do not use heating pads or hot water bottles on your feet. They may burn your skin. If you have lost feeling in your feet or legs, you may not know this is happening until it is too late.  Protect your feet from hot and cold by wearing shoes, such as at the beach or on hot pavement.  Schedule a complete foot exam at least once a year (annually) or more often if you have foot problems. Report any cuts, sores, or bruises to your health care provider immediately. Where to find more information  American Diabetes Association: www.diabetes.org  Association of Diabetes Care & Education Specialists: www.diabeteseducator.org Contact a health care provider if:  You have a medical condition that increases your risk of infection and   you have any cuts, sores, or bruises on your feet.  You have an injury that is not healing.  You have redness on your legs or feet.  You feel burning or tingling in your legs or feet.  You have pain or cramps in your legs and feet.  Your legs or feet are numb.  Your feet always feel cold.  You have pain around any toenails. Get help right away if:  You have a wound, scrape, corn, or callus on your foot and: ? You have pain, swelling, or redness that gets worse. ? You have fluid or blood coming from the wound, scrape, corn, or callus. ? Your wound, scrape, corn, or callus feels warm to the touch. ? You have pus or a bad smell coming from  the wound, scrape, corn, or callus. ? You have a fever. ? You have a red line going up your leg. Summary  Check your feet every day for blisters, cuts, bruises, sores, and redness.  Apply a moisturizing lotion or petroleum jelly to the skin on your feet and to dry, brittle toenails.  Wear shoes that fit properly and have enough cushioning.  If you have foot problems, report any cuts, sores, or bruises to your health care provider immediately.  Schedule a complete foot exam at least once a year (annually) or more often if you have foot problems. This information is not intended to replace advice given to you by your health care provider. Make sure you discuss any questions you have with your health care provider. Document Revised: 01/11/2020 Document Reviewed: 01/11/2020 Elsevier Patient Education  2021 [employer]  

## 2020-11-11 ENCOUNTER — Other Ambulatory Visit: Payer: Self-pay | Admitting: Family Medicine

## 2020-11-12 ENCOUNTER — Telehealth: Payer: Self-pay | Admitting: Pharmacist

## 2020-11-13 ENCOUNTER — Ambulatory Visit (INDEPENDENT_AMBULATORY_CARE_PROVIDER_SITE_OTHER): Payer: Medicare Other | Admitting: Pharmacist

## 2020-11-13 DIAGNOSIS — E0590 Thyrotoxicosis, unspecified without thyrotoxic crisis or storm: Secondary | ICD-10-CM

## 2020-11-13 DIAGNOSIS — E1165 Type 2 diabetes mellitus with hyperglycemia: Secondary | ICD-10-CM

## 2020-11-13 DIAGNOSIS — K9049 Malabsorption due to intolerance, not elsewhere classified: Secondary | ICD-10-CM

## 2020-11-13 NOTE — Progress Notes (Signed)
Chronic Care Management Pharmacy Note  11/19/2020 Name:  Julie Norton MRN:  [id number] DOB:  Aug 17, 1929  Subjective: Julie Norton is an 87 y.o. year old female who is a primary patient of Burchette, Alinda Sierras, MD.  The CCM team was consulted for assistance with disease management and care coordination needs.    Engaged with patient by telephone for follow up visit in response to provider referral for pharmacy case management and/or care coordination services.   Consent to Services:  The patient was given information about Chronic Care Management services, agreed to services, and gave verbal consent prior to initiation of services.  Please see initial visit note for detailed documentation.   Patient Care Team: Eulas Post, MD as PCP - General (Family Medicine) Martinique, Peter M, MD as PCP - Cardiology (Cardiology) Neldon Mc, MD as Surgeon (General Surgery) Maisie Fus, MD (Obstetrics and Gynecology) Viona Gilmore, [facility] as Pharmacist (Pharmacist)  Recent office visits: 10/29/20 [name], MD: Patient presented for chronic conditions follow up. Patient requested to avoid repeat A1c today due to recent steroid injection. Refilled fluconazole for yeast infection symptoms.  09/11/20 Charlott Rakes, LPN: Patient presented for AWV.  07/29/20 [name], MD: Patient presented for DM follow up. Decreased Tresiba to 17 units due to hypoglycemia. A1c improved to 7.8% with a goal < 8%.   06/19/20 [name], MD: Patient presented for edema and DM follow up. Follow up in 2 months.  05/28/20 [name], MD: Patient presented for edema follow up. Continue torsemide and potassium. Recheck BMP today.  Recent consult visits: 09/27/20 Susa Day (emergeortho): Unable to access notes.  09/16/20 Peter Martinique, MD (cardiology): Patient presented for A fib follow up with complaints of headaches and edema. No medication changes.  07/30/20 Marica Otter  (optometry): Unable to access notes.  Hospital visits: None in previous 6 months  Objective:  Lab Results  Component Value Date   CREATININE 0.81 06/13/2020   BUN 13 06/13/2020   GFR 70.88 06/23/2019   GFRNONAA 65 06/13/2020   GFRAA 75 06/13/2020   NA 141 06/13/2020   K 5.0 06/13/2020   CALCIUM 9.4 06/13/2020   CO2 23 06/13/2020   GLUCOSE 178 (H) 06/13/2020    Lab Results  Component Value Date/Time   HGBA1C 7.8 (A) 07/29/2020 01:37 PM   HGBA1C 9.1 (H) 04/22/2020 04:17 PM   HGBA1C 8.3 (A) 10/27/2019 10:26 AM   HGBA1C 8.1 (H) 06/23/2019 08:25 AM   GFR 70.88 06/23/2019 08:25 AM   GFR 74.29 12/21/2018 08:00 AM   MICROALBUR 0.9 04/07/2013 07:58 AM    Last diabetic Eye exam:  Lab Results  Component Value Date/Time   HMDIABEYEEXA No Retinopathy 07/30/2020 12:00 AM    Last diabetic Foot exam:  Lab Results  Component Value Date/Time   HMDIABFOOTEX normal 08/28/2014 12:00 AM     Lab Results  Component Value Date   CHOL 94 06/23/2019   HDL 35.20 (L) 06/23/2019   LDLCALC 45 06/23/2019   TRIG 70.0 06/23/2019   CHOLHDL 3 06/23/2019    Hepatic Function Latest Ref Rng & Units 12/11/2019 06/23/2019 03/27/2019  Total Protein 6.0 - 8.5 g/dL 6.4 6.6 6.8  Albumin 3.6 - 4.6 g/dL 4.1 4.2 3.8  AST 0 - 40 IU/L 39 16 23  ALT 0 - 32 IU/L 32 9 18  Alk Phosphatase 48 - 121 IU/L 87 60 61  Total Bilirubin 0.0 - 1.2 mg/dL 0.4 0.9 0.7  Bilirubin, Direct 0.0 - 0.3 mg/dL -  0.2 -    Lab Results  Component Value Date/Time   TSH 2.08 04/22/2020 04:17 PM   TSH 2.690 12/11/2019 02:54 PM   FREET4 1.77 12/11/2019 02:54 PM   FREET4 1.92 (H) 09/04/2019 12:49 PM    CBC Latest Ref Rng & Units 02/19/2020 12/06/2019 09/04/2019  WBC 4.0 - 10.5 K/uL 4.7 5.4 6.3  Hemoglobin 12.0 - 15.0 g/dL 9.4(L) 10.6(L) 10.0(L)  Hematocrit 36.0 - 46.0 % 31.5(L) 31.9(L) 32.1(L)  Platelets 150 - 400 K/uL 158 185.0 166    Lab Results  Component Value Date/Time   VD25OH 52 09/07/2011 09:13 AM    Clinical ASCVD:  No  The ASCVD Risk score Mikey Bussing DC Jr., et al., 2013) failed to calculate for the following reasons:   The 2013 ASCVD risk score is only valid for ages 39 to 42    Depression screen PHQ 2/9 09/11/2020 06/23/2019 12/21/2017  Decreased Interest 0 0 0  Down, Depressed, Hopeless 0 0 0  PHQ - 2 Score 0 0 0  Some recent data might be hidden     CHA2DS2/VAS Stroke Risk Points  Current as of a minute ago     5 >= 2 Points: High Risk  1 - 1.99 Points: Medium Risk  0 Points: Low Risk    Last Change: N/A      Details    This score determines the patient's risk of having a stroke if the  patient has atrial fibrillation.       Points Metrics  1 Has Congestive Heart Failure:  Yes    Current as of a minute ago  0 Has Vascular Disease:  No    Current as of a minute ago  0 Has Hypertension:  No    Current as of a minute ago  2 Age:  75    Current as of a minute ago  1 Has Diabetes:  Yes    Current as of a minute ago  0 Had Stroke:  No  Had TIA:  No  Had Thromboembolism:  No    Current as of a minute ago  1 Female:  Yes    Current as of a minute ago     Social History   Tobacco Use  Smoking Status Never Smoker  Smokeless Tobacco Never Used   BP Readings from Last 3 Encounters:  10/29/20 124/72  09/16/20 (!) 128/54  07/29/20 140/70   Pulse Readings from Last 3 Encounters:  10/29/20 65  09/16/20 (!) 54  07/29/20 62   Wt Readings from Last 3 Encounters:  10/29/20 133 lb 3.2 oz (60.4 kg)  09/16/20 136 lb (61.7 kg)  07/29/20 135 lb (61.2 kg)   BMI Readings from Last 3 Encounters:  10/29/20 25.17 kg/m  09/16/20 25.70 kg/m  07/29/20 25.51 kg/m    Assessment/Interventions: Review of patient past medical history, allergies, medications, health status, including review of consultants reports, laboratory and other test data, was performed as part of comprehensive evaluation and provision of chronic care management services.   SDOH:  (Social Determinants of Health) assessments  and interventions performed: No  SDOH Screenings   Alcohol Screen: Not on file  Depression (PHQ2-9): Low Risk   . PHQ-2 Score: 0  Financial Resource Strain: Low Risk   . Difficulty of Paying Living Expenses: Not hard at all  Food Insecurity: No Food Insecurity  . Worried About Charity fundraiser in the Last Year: Never true  . Ran Out of Food in the Last Year:  Never true  Housing: Low Risk   . Last Housing Risk Score: 0  Physical Activity: Inactive  . Days of Exercise per Week: 0 days  . Minutes of Exercise per Session: 0 min  Social Connections: Moderately Isolated  . Frequency of Communication with Friends and Family: More than three times a week  . Frequency of Social Gatherings with Friends and Family: Once a week  . Attends Religious Services: Never  . Active Member of Clubs or Organizations: No  . Attends Archivist Meetings: Never  . Marital Status: Married  Stress: No Stress Concern Present  . Feeling of Stress : Only a little  Tobacco Use: Low Risk   . Smoking Tobacco Use: Never Smoker  . Smokeless Tobacco Use: Never Used  Transportation Needs: No Transportation Needs  . Lack of Transportation (Medical): No  . Lack of Transportation (Non-Medical): No    CCM Care Plan  Allergies  Allergen Reactions  . Bactrim Nausea And Vomiting  . Dilaudid [Hydromorphone Hcl] Other (See Comments)    Unknown  . Hydrocodone Nausea And Vomiting  . Invokana [Canagliflozin] Itching and Other (See Comments)    Yeast Infections   . Macrodantin Nausea And Vomiting    Medications Reviewed Today    Reviewed by Eulas Post, MD (Physician) on 10/29/20 at 1345  Med List Status: <None>  Medication Order Taking? Sig Documenting Provider Last Dose Status Informant  acetaminophen (TYLENOL) 500 MG tablet [id number] Yes Take 1,000 mg by mouth every 6 (six) hours as needed for mild pain or moderate pain.  [provider] Taking Active Self  apixaban (ELIQUIS) 5 MG  TABS tablet [id number] Yes Take 1 tablet (5 mg total) by mouth 2 (two) times daily. Martinique, Peter M, MD Taking Active   CALCIUM PO [id number] Yes Take 1 tablet by mouth daily. [provider] Taking Active   Cholecalciferol ([facility] D3) 125 MCG (5000 UT) capsule [id number] Yes Take 1 capsule (5,000 Units total) by mouth daily. Eulas Post, MD Taking Active   fluconazole (DIFLUCAN) 150 MG tablet [id number] Yes Take 1 tablet (150 mg total) by mouth once for 1 dose. Burchette, Alinda Sierras, MD  Active   glucose blood (ONE TOUCH ULTRA TEST) test strip [id number] Yes Check once daily. E11.40 Eulas Post, MD Taking Active Self  insulin degludec (TRESIBA FLEXTOUCH) 100 UNIT/ML FlexTouch Pen [id number] Yes INJECT 24 UNITS TOTAL INTO THE SKIN DAILY AT 10PM  Patient taking differently: INJECT 17 UNITS TOTAL INTO THE SKIN DAILY AT 10PM   Eulas Post, MD Taking Active   Insulin Pen Needle 29G X 5MM MISC [id number] Yes Use once daily Eulas Post, MD Taking Active Self  levothyroxine (SYNTHROID) 50 MCG tablet [id number] Yes Take 1 tablet (50 mcg total) by mouth daily. Eulas Post, MD Taking Active   loratadine (CLARITIN) 10 MG tablet [id number] Yes Take 10 mg by mouth daily as needed for allergies. [provider] Taking Active Self  metFORMIN (GLUCOPHAGE-XR) 500 MG 24 hr tablet [id number] Yes Take 1 tablet (500 mg total) by mouth 2 (two) times daily. Burchette, Alinda Sierras, MD Taking Active   metoprolol succinate (TOPROL-XL) 25 MG 24 hr tablet [id number] Yes Take 1 tablet (25 mg total) by mouth daily. Martinique, Peter M, MD Taking Active   omeprazole (PRILOSEC) 20 MG capsule [id number] Yes Take 1 capsule (20 mg total) by mouth 2 (two) times daily. Eulas Post, MD Taking Active   ONE TOUCH ULTRA TEST test strip  [id number] Yes CHECK ONCE A DAY Burchette, Alinda Sierras, MD Taking Active Self  potassium chloride (KLOR-CON) 10 MEQ tablet [id number] Yes Take 1 tablet (10 mEq total) by mouth  daily. Eulas Post, MD Taking Active   torsemide (DEMADEX) 10 MG tablet [id number] Yes Take 1 tablet (10 mg total) by mouth daily. Eulas Post, MD Taking Active   traZODone (DESYREL) 50 MG tablet [id number] Yes TAKE 1/2 TO 1 TABLET BY MOUTH AT BEDTIME AS NEEDED FOR SLEEP Burchette, Alinda Sierras, MD Taking Active   vitamin B-12 (CYANOCOBALAMIN) 1000 MCG tablet [id number] Yes Take 1 tablet (1,000 mcg total) by mouth daily. Eulas Post, MD Taking Active           Patient Active Problem List   Diagnosis Date Noted  . Degeneration of lumbar intervertebral disc 02/03/2019  . GERD (gastroesophageal reflux disease) 11/30/2017  . Other persistent atrial fibrillation ([place])   . Injury of muscle of rotator cuff 10/12/2017  . Iron deficiency anemia, unspecified 03/24/2017  . Poorly controlled type 2 diabetes mellitus ([place]) 02/23/2017  . Mitral insufficiency 02/04/2016  . Right bundle branch block 05/16/2015  . Chest pain of unknown etiology 05/16/2015  . Peripheral neuropathy 08/23/2014  . Heart murmur previously undiagnosed 01/22/2014  . Abdominal pain 11/11/2011  . Ductal carcinoma in situ (DCIS) of left breast 01/12/2011  . Hypercholesterolemia 11/28/2010  . Hypothyroidism 11/28/2010  . Postmenopausal state 11/28/2010  . Osteoarthritis 11/28/2010  . Dyspepsia 11/28/2010    Immunization History  Administered Date(s) Administered  . Fluad Quad(high Dose 65+) 05/02/2019, 06/19/2020  . Influenza Split 07/27/2011  . Influenza, High Dose Seasonal PF 05/01/2015, 04/22/2016, 04/22/2017  . Influenza,inj,Quad PF,6+ Mos 04/21/2013, 05/07/2014, 03/28/2018  . Influenza,inj,quad, With Preservative 04/05/2017  . Moderna Sars-Covid-2 Vaccination 10/20/2019, 11/03/2019  . Pneumococcal Conjugate-13 03/03/2016  . Tdap 08/28/2014    Conditions to be addressed/monitored:  Hyperlipidemia, Diabetes, Atrial Fibrillation, Heart Failure, GERD, Hypothyroidism, Osteoarthritis and Vitamin B12  deficiency  Care Plan : [place]  Updates made by Viona Gilmore, [place] since 11/19/2020 12:00 AM    Problem: Problem: Hyperlipidemia, Diabetes, Atrial Fibrillation, Heart Failure, GERD, Hypothyroidism, Osteoarthritis and Vitamin B12 deficiency     Long-Range Goal: Patient-Specific Goal   Start Date: 11/13/2020  Expected End Date: 11/13/2021  This Visit's Progress: On track  Priority: High  Note:   Current Barriers:  . Unable to independently monitor therapeutic efficacy . Unable to maintain control of diabetes  Pharmacist Clinical Goal(s):  Marland Kitchen Patient will achieve adherence to monitoring guidelines and medication adherence to achieve therapeutic efficacy through collaboration with PharmD and provider.   Interventions: . 1:1 collaboration with Eulas Post, MD regarding development and update of comprehensive plan of care as evidenced by provider attestation and co-signature . Inter-disciplinary care team collaboration (see longitudinal plan of care) . Comprehensive medication review performed; medication list updated in electronic medical record  Hyperlipidemia: (LDL goal < 100) -Controlled -Current treatment: . No medications -Medications previously tried: atorvastatin (muscle pain and low LDL)  -Current dietary patterns: did not discuss -Current exercise habits: limited -Educated on Cholesterol goals;  Exercise goal of 150 minutes per week; -Counseled on diet and exercise extensively  Diabetes (A1c goal <8%) -Controlled -Current medications:  Metformin ER $RemoveBefo'500mg'FffmIczSLzE$ , 1 tablet twice daily - taking once a day  Tresiba Flextouch, inject 22 units once daily at 10 PM  -Medications previously tried: glimepiride, Invokana (yeast infections)   -Current home glucose readings . fasting glucose: 156, 169, 140, 141,  128, 137, 161 . post prandial glucose: does not check -Denies hypoglycemic/hyperglycemic symptoms -Current meal patterns:  . breakfast: did not  discuss . lunch: did not discuss . dinner: did not discuss . snacks: did not discuss . drinks: did not discuss -Current exercise: limited -Educated on A1c and blood sugar goals; Exercise goal of 150 minutes per week; Benefits of routine self-monitoring of blood sugar; Carbohydrate counting and/or plate method -Counseled to check feet daily and get yearly eye exams -Counseled on diet and exercise extensively Recommended to continue current medication  Atrial Fibrillation (Goal: prevent stroke and major bleeding) -Controlled -CHADSVASC: 5 -Current treatment:  Rate control: metoprolol succinate (Toprol XL) 25mg , 1 tablet once daily  Anticoagulation: apixaban (Eliquis) 5mg  ,1 tablet twice daily  -Medications previously tried: Flecainide (nausea), amiodarone (side effects) -Home BP and HR readings: did not provide  -Counseled on importance of adherence to anticoagulant exactly as prescribed; avoidance of NSAIDs due to increased bleeding risk with anticoagulants; -Recommended to continue current medication  Heart Failure (Goal: manage symptoms and prevent exacerbations) -Controlled -Last ejection fraction: 50-55% (Date: 10/28/2017) -HF type: Diastolic -NYHA Class: II (slight limitation of activity) -AHA HF Stage: C (Heart disease and symptoms present) -Current treatment:  Metoprolol succinate (Toprol XL) 25mg , 1 tablet once daily   Torsemide 10mg , 1 tablet once daily  -Medications previously tried: none  -Current home BP/HR readings: did not provide -Current dietary habits: limits salt intake -Current exercise habits: limited -Educated on Benefits of medications for managing symptoms and prolonging life Importance of weighing daily; if you gain more than 3 pounds in one day or 5 pounds in one week, call cardiologist -Counseled on diet and exercise extensively Recommended to continue current medication Patient is weighing herself daily  Hypothyroidism (Goal: TSH  0.35-4.5) -Controlled -Current treatment  . Levothyroxine 48mcg, 1 tablet once daily  -Medications previously tried: none  -Recommended to continue current medication Counseled on administration of levothyroxine on an empty stomach, at least 30 minutes before first meal   GERD (Goal: minimize symptoms) -Controlled -Current treatment  . Omeprazole 20mg , 1 capsule twice daily  -Medications previously tried: none  -Recommended to continue current medication  Osteoarthritis (Goal: minimize symptoms) -Not ideally controlled -Current treatment  . APAP 500 mg, 2 tablets every six hours as needed for mild pain or moderate pain -Medications previously tried: cortisone shots (did not help)  -Recommended to continue current medication  Vitamin D deficiency (Goal: 30-100) -Controlled -Current treatment  . Vitamin D3 5000 units, 1 capsule once daily  -Medications previously tried: none  -Recommended repeat vitamin D level  Allergic rhinitis (Goal: minimize symptoms) -Controlled -Current treatment  . Loratadine 10mg ,1 tablet once daily as needed for allergies -Medications previously tried: none  -Recommended to continue current medication  Insomnia (Goal: improve quality and quantity of sleep) -Not ideally controlled -Current treatment  . Trazodone 50mg , 0.5 (one-half) to 1 tablet at bedtime as needed for sleep -Medications previously tried: melatonin (ineffective) -Counseled on practicing good sleep hygiene by setting a sleep schedule and maintaining it, avoid excessive napping, following a nightly routine, avoiding screen time for 30-60 minutes before going to bed, and making the bedroom a cool, quiet and dark space Recommended trying Tylenol before bed as she has pain at bedtime  Vitamin B12 deficiency (Goal: 211-911) -Controlled -Current treatment  . Vitamin B12 (cyanocobalamin) 1000 units, 1 tablet once daily  -Medications previously tried: none  -Recommended repeat vitamin  B12 level   Health Maintenance -Vaccine gaps: shingles, pneumovax, COVID booster -  Current therapy:  . Calcium 600 mg 1 tablet daily -Educated on Cost vs benefit of each product must be carefully weighed by individual consumer -Patient is satisfied with current therapy and denies issues -Recommended to continue current medication  Patient Goals/Self-Care Activities . Patient will:  - take medications as prescribed weigh daily, and contact provider if weight gain of > 3 lbs in one day or > 5 lbs in one week target a minimum of 150 minutes of moderate intensity exercise weekly  Follow Up Plan: Telephone follow up appointment with care management team member scheduled for: 5 months       Medication Assistance: None required.  Patient affirms current coverage meets needs.  Patient's preferred pharmacy is:  [facility] - [place], [place] - [address] [place] [address] Phone: [phone] Fax: [phone]  [id number] - [address] [place] [address] Phone: [phone] Fax: [phone]  Uses pill box? No - adherence packaging Pt endorses 99% compliance  We discussed: Benefits of medication synchronization, packaging and delivery as well as enhanced pharmacist oversight with [facility]. Patient decided to: Utilize [facility] pharmacy for medication synchronization, packaging and delivery  Care Plan and Follow Up Patient Decision:  Patient agrees to Care Plan and Follow-up.  Plan: Telephone follow up appointment with care management team member scheduled for:  6 months  Jeni Salles, PharmD [place] Pharmacist [place] at [place] [phone]

## 2020-11-13 NOTE — Chronic Care Management (AMB) (Signed)
I left the patient a message about changing her appointment from May 18th to May 11th. Patient called me back and we discussed change in appointment, and she agreed. She was asked to please have all medication on hand to review with the pharmacist. She confirmed the appointment.   Maia Breslow, [place] [phone]

## 2020-11-14 DIAGNOSIS — E119 Type 2 diabetes mellitus without complications: Secondary | ICD-10-CM | POA: Diagnosis not present

## 2020-11-14 DIAGNOSIS — Z794 Long term (current) use of insulin: Secondary | ICD-10-CM | POA: Diagnosis not present

## 2020-11-19 NOTE — Patient Instructions (Signed)
Hi Robertta,  It was great speaking with you again! Below is a summary of some of the topics we discussed.   Please reach out to me if you have any questions or need anything before our follow up!  Best, Maddie  Jeni Salles, PharmD, [facility]  Visit Information  Goals Addressed   None    Patient Care Plan: CCM Pharmacy Care Plan    Problem Identified: Problem: Hyperlipidemia, Diabetes, Atrial Fibrillation, Heart Failure, GERD, Hypothyroidism, Osteoarthritis and Vitamin B12 deficiency     Long-Range Goal: Patient-Specific Goal   Start Date: 11/13/2020  Expected End Date: 11/13/2021  This Visit's Progress: On track  Priority: High  Note:   Current Barriers:  . Unable to independently monitor therapeutic efficacy . Unable to maintain control of diabetes  Pharmacist Clinical Goal(s):  Marland Kitchen Patient will achieve adherence to monitoring guidelines and medication adherence to achieve therapeutic efficacy through collaboration with PharmD and provider.   Interventions: . 1:1 collaboration with Eulas Post, MD regarding development and update of comprehensive plan of care as evidenced by provider attestation and co-signature . Inter-disciplinary care team collaboration (see longitudinal plan of care) . Comprehensive medication review performed; medication list updated in electronic medical record  Hyperlipidemia: (LDL goal < 100) -Controlled -Current treatment: . No medications -Medications previously tried: atorvastatin (muscle pain and low LDL)  -Current dietary patterns: did not discuss -Current exercise habits: limited -Educated on Cholesterol goals;  Exercise goal of 150 minutes per week; -Counseled on diet and exercise extensively  Diabetes (A1c goal <8%) -Controlled -Current medications:  Metformin ER 500mg , 1 tablet twice daily - taking once a day  Tresiba Flextouch, inject 22 units once daily at  10 PM  -Medications previously tried: glimepiride, Invokana (yeast infections)   -Current home glucose readings . fasting glucose: 156, 169, 140, 141, 128, 137, 161 . post prandial glucose: does not check -Denies hypoglycemic/hyperglycemic symptoms -Current meal patterns:  . breakfast: did not discuss . lunch: did not discuss . dinner: did not discuss . snacks: did not discuss . drinks: did not discuss -Current exercise: limited -Educated on A1c and blood sugar goals; Exercise goal of 150 minutes per week; Benefits of routine self-monitoring of blood sugar; Carbohydrate counting and/or plate method -Counseled to check feet daily and get yearly eye exams -Counseled on diet and exercise extensively Recommended to continue current medication  Atrial Fibrillation (Goal: prevent stroke and major bleeding) -Controlled -CHADSVASC: 5 -Current treatment:  Rate control: metoprolol succinate (Toprol XL) 25mg , 1 tablet once daily  Anticoagulation: apixaban (Eliquis) 5mg  ,1 tablet twice daily  -Medications previously tried: Flecainide (nausea), amiodarone (side effects) -Home BP and HR readings: did not provide  -Counseled on importance of adherence to anticoagulant exactly as prescribed; avoidance of NSAIDs due to increased bleeding risk with anticoagulants; -Recommended to continue current medication  Heart Failure (Goal: manage symptoms and prevent exacerbations) -Controlled -Last ejection fraction: 50-55% (Date: 10/28/2017) -HF type: Diastolic -NYHA Class: II (slight limitation of activity) -AHA HF Stage: C (Heart disease and symptoms present) -Current treatment:  Metoprolol succinate (Toprol XL) 25mg , 1 tablet once daily   Torsemide 10mg , 1 tablet once daily  -Medications previously tried: none  -Current home BP/HR readings: did not provide -Current dietary habits: limits salt intake -Current exercise habits: limited -Educated on Benefits of medications for managing symptoms  and prolonging life Importance of weighing daily; if you gain more than 3 pounds in one day or 5 pounds in one week,  call cardiologist -Counseled on diet and exercise extensively Recommended to continue current medication Patient is weighing herself daily  Hypothyroidism (Goal: TSH 0.35-4.5) -Controlled -Current treatment  . Levothyroxine 37mcg, 1 tablet once daily  -Medications previously tried: none  -Recommended to continue current medication Counseled on administration of levothyroxine on an empty stomach, at least 30 minutes before first meal   GERD (Goal: minimize symptoms) -Controlled -Current treatment  . Omeprazole 20mg , 1 capsule twice daily  -Medications previously tried: none  -Recommended to continue current medication  Osteoarthritis (Goal: minimize symptoms) -Not ideally controlled -Current treatment  . APAP 500 mg, 2 tablets every six hours as needed for mild pain or moderate pain -Medications previously tried: cortisone shots (did not help)  -Recommended to continue current medication  Vitamin D deficiency (Goal: 30-100) -Controlled -Current treatment  . Vitamin D3 5000 units, 1 capsule once daily  -Medications previously tried: none  -Recommended repeat vitamin D level  Allergic rhinitis (Goal: minimize symptoms) -Controlled -Current treatment  . Loratadine 10mg ,1 tablet once daily as needed for allergies -Medications previously tried: none  -Recommended to continue current medication  Insomnia (Goal: improve quality and quantity of sleep) -Not ideally controlled -Current treatment  . Trazodone 50mg , 0.5 (one-half) to 1 tablet at bedtime as needed for sleep -Medications previously tried: melatonin (ineffective) -Counseled on practicing good sleep hygiene by setting a sleep schedule and maintaining it, avoid excessive napping, following a nightly routine, avoiding screen time for 30-60 minutes before going to bed, and making the bedroom a cool, quiet  and dark space Recommended trying Tylenol before bed as she has pain at bedtime  Vitamin B12 deficiency (Goal: 211-911) -Controlled -Current treatment  . Vitamin B12 (cyanocobalamin) 1000 units, 1 tablet once daily  -Medications previously tried: none  -Recommended repeat vitamin B12 level   Health Maintenance -Vaccine gaps: shingles, pneumovax, COVID booster -Current therapy:  . Calcium 600 mg 1 tablet daily -Educated on Cost vs benefit of each product must be carefully weighed by individual consumer -Patient is satisfied with current therapy and denies issues -Recommended to continue current medication  Patient Goals/Self-Care Activities . Patient will:  - take medications as prescribed weigh daily, and contact provider if weight gain of > 3 lbs in one day or > 5 lbs in one week target a minimum of 150 minutes of moderate intensity exercise weekly  Follow Up Plan: Telephone follow up appointment with care management team member scheduled for: 5 months       Patient verbalizes understanding of instructions provided today and agrees to view in [place].  Telephone follow up appointment with pharmacy team member scheduled for: 6 months  Viona Gilmore, [facility]

## 2020-11-19 NOTE — Addendum Note (Signed)
Addended by: Eulas Post on: 11/19/2020 05:54 PM   Modules accepted: Orders

## 2020-11-20 ENCOUNTER — Telehealth: Payer: Medicare Other

## 2020-11-21 ENCOUNTER — Other Ambulatory Visit: Payer: Self-pay | Admitting: Family Medicine

## 2020-11-21 ENCOUNTER — Other Ambulatory Visit: Payer: Self-pay | Admitting: Cardiology

## 2020-11-21 NOTE — Telephone Encounter (Signed)
52f, 60.4kg, scr 0.81 06/13/20, lovw/jordan 09/16/20

## 2020-12-05 ENCOUNTER — Telehealth: Payer: Self-pay | Admitting: Pharmacist

## 2020-12-05 NOTE — Chronic Care Management (AMB) (Signed)
    Chronic Care Management Pharmacy Assistant   Name: Julie Norton  MRN: [id number] DOB: [birth year]  Reason for Encounter: Medication Review- Acute fill   Recent office visits:  None  Recent consult visits:  None  Hospital visits:  None in previous 6 months  Medications: Outpatient Encounter Medications as of 12/05/2020  Medication Sig  . acetaminophen (TYLENOL) 500 MG tablet Take 1,000 mg by mouth every 6 (six) hours as needed for mild pain or moderate pain.   Marland Kitchen CALCIUM PO Take 1 tablet by mouth daily.  . D-3-5 125 MCG (5000 UT) capsule TAKE ONE CAPSULE BY MOUTH EVERY MORNING  . ELIQUIS 5 MG TABS tablet TAKE ONE TABLET every morning AND TABLET dinner  . glucose blood (ONE TOUCH ULTRA TEST) test strip Check once daily. E11.40  . insulin degludec (TRESIBA FLEXTOUCH) 100 UNIT/ML FlexTouch Pen INJECT 24 UNITS TOTAL INTO THE SKIN DAILY AT 10PM (Patient taking differently: INJECT 17 UNITS TOTAL INTO THE SKIN DAILY AT 10PM)  . Insulin Pen Needle 29G X 5MM MISC Use once daily  . levothyroxine (SYNTHROID) 50 MCG tablet Take 1 tablet (50 mcg total) by mouth daily.  Marland Kitchen loratadine (CLARITIN) 10 MG tablet Take 10 mg by mouth daily as needed for allergies.  . metFORMIN (GLUCOPHAGE-XR) 500 MG 24 hr tablet Take 1 tablet (500 mg total) by mouth 2 (two) times daily.  . metoprolol succinate (TOPROL-XL) 25 MG 24 hr tablet Take 1 tablet (25 mg total) by mouth daily.  Marland Kitchen omeprazole (PRILOSEC) 20 MG capsule Take 1 capsule (20 mg total) by mouth 2 (two) times daily.  . ONE TOUCH ULTRA TEST test strip CHECK ONCE A DAY  . potassium chloride (KLOR-CON) 10 MEQ tablet TAKE ONE TABLET BY MOUTH EVERY MORNING  . torsemide (DEMADEX) 10 MG tablet TAKE ONE TABLET BY MOUTH EVERY MORNING  . traZODone (DESYREL) 50 MG tablet TAKE 1/2 TO 1 TABLET BY MOUTH AT BEDTIME AS NEEDED FOR SLEEP  . vitamin B-12 (CYANOCOBALAMIN) 1000 MCG tablet Take 1 tablet (1,000 mcg total) by mouth daily.   No facility-administered  encounter medications on file as of 12/05/2020.   Received call from patient regarding medication management via [facility].  Patient requested an acute fill for Tresiba Flextouch 100 units  to be delivered: 06.03.2022 Pharmacy needs refills? No  Confirmed delivery date of 12/06/2020, advised patient that pharmacy will contact them the morning of delivery.  Star Rating Drugs: Medication Dispensed Quantity Pharmacy  Metformin 500 mg TB 24 03.03.2022 [facility]    Amilia Revonda Standard, [place] Pharmacist Assistant [phone]

## 2020-12-11 ENCOUNTER — Telehealth: Payer: Self-pay

## 2020-12-11 NOTE — Chronic Care Management (AMB) (Signed)
    Chronic Care Management Pharmacy Assistant   Name: Julie Norton  MRN: [id number] DOB: 01-19-1930   Reason for Encounter: Medication Refill     Medications: Outpatient Encounter Medications as of 12/11/2020  Medication Sig  . acetaminophen (TYLENOL) 500 MG tablet Take 1,000 mg by mouth every 6 (six) hours as needed for mild pain or moderate pain.   Marland Kitchen CALCIUM PO Take 1 tablet by mouth daily.  . D-3-5 125 MCG (5000 UT) capsule TAKE ONE CAPSULE BY MOUTH EVERY MORNING  . ELIQUIS 5 MG TABS tablet TAKE ONE TABLET every morning AND TABLET dinner  . glucose blood (ONE TOUCH ULTRA TEST) test strip Check once daily. E11.40  . insulin degludec (TRESIBA FLEXTOUCH) 100 UNIT/ML FlexTouch Pen INJECT 24 UNITS TOTAL INTO THE SKIN DAILY AT 10PM (Patient taking differently: INJECT 17 UNITS TOTAL INTO THE SKIN DAILY AT 10PM)  . Insulin Pen Needle 29G X 5MM MISC Use once daily  . levothyroxine (SYNTHROID) 50 MCG tablet Take 1 tablet (50 mcg total) by mouth daily.  Marland Kitchen loratadine (CLARITIN) 10 MG tablet Take 10 mg by mouth daily as needed for allergies.  . metFORMIN (GLUCOPHAGE-XR) 500 MG 24 hr tablet Take 1 tablet (500 mg total) by mouth 2 (two) times daily.  . metoprolol succinate (TOPROL-XL) 25 MG 24 hr tablet Take 1 tablet (25 mg total) by mouth daily.  Marland Kitchen omeprazole (PRILOSEC) 20 MG capsule Take 1 capsule (20 mg total) by mouth 2 (two) times daily.  . ONE TOUCH ULTRA TEST test strip CHECK ONCE A DAY  . potassium chloride (KLOR-CON) 10 MEQ tablet TAKE ONE TABLET BY MOUTH EVERY MORNING  . torsemide (DEMADEX) 10 MG tablet TAKE ONE TABLET BY MOUTH EVERY MORNING  . traZODone (DESYREL) 50 MG tablet TAKE 1/2 TO 1 TABLET BY MOUTH AT BEDTIME AS NEEDED FOR SLEEP  . vitamin B-12 (CYANOCOBALAMIN) 1000 MCG tablet Take 1 tablet (1,000 mcg total) by mouth daily.   No facility-administered encounter medications on file as of 12/11/2020.   Received a notification from the pharmacy regarding medication maintenance. I  contacted the patient, and she counted her medications. An acute form was sent for the delivery date.  Explanation of abundance on hand of the following medications: . Levothyroxine 50 mcg: one tablet before breakfast: 90 tablets remaining . Metformin Er 500 mg: One tablet twice daily. (Patient reports taking differently) She has been taking one table daily for the past six weeks. 180 tablets remaining . Omeprazole 20 mg: one capsule twice daily: 90 remaining  Patient requested an acute fill for  Eliquis 5 mg: take one tablet twice daily  to be delivered: 12/19/2020 Pharmacy needs refills? No  Confirmed delivery date of 12/19/2020, advised patient that pharmacy will contact them the morning of delivery.  Star Rating Drugs: Medication Dispensed Quantity Pharmacy  Metformin 500 mg 03.03.2022 [place] Revonda Standard, [place] Pharmacist Assistant [phone]

## 2020-12-26 DIAGNOSIS — M5136 Other intervertebral disc degeneration, lumbar region: Secondary | ICD-10-CM | POA: Diagnosis not present

## 2020-12-26 DIAGNOSIS — M419 Scoliosis, unspecified: Secondary | ICD-10-CM | POA: Diagnosis not present

## 2021-01-03 ENCOUNTER — Telehealth: Payer: Self-pay | Admitting: Pharmacist

## 2021-01-03 NOTE — Chronic Care Management (AMB) (Signed)
    Chronic Care Management Pharmacy Assistant   Name: Julie Norton  MRN: [id number] DOB: 29-Sep-1929  Reason for Encounter: Medication Review-Medication Coordination Call   Recent office visits:  None  Recent consult visits:  None  Hospital visits:  None in previous 6 months  Medications: Outpatient Encounter Medications as of 01/03/2021  Medication Sig   acetaminophen (TYLENOL) 500 MG tablet Take 1,000 mg by mouth every 6 (six) hours as needed for mild pain or moderate pain.    CALCIUM PO Take 1 tablet by mouth daily.   D-3-5 125 MCG (5000 UT) capsule TAKE ONE CAPSULE BY MOUTH EVERY MORNING   ELIQUIS 5 MG TABS tablet TAKE ONE TABLET every morning AND TABLET dinner   glucose blood (ONE TOUCH ULTRA TEST) test strip Check once daily. E11.40   insulin degludec (TRESIBA FLEXTOUCH) 100 UNIT/ML FlexTouch Pen INJECT 24 UNITS TOTAL INTO THE SKIN DAILY AT 10PM (Patient taking differently: INJECT 17 UNITS TOTAL INTO THE SKIN DAILY AT 10PM)   Insulin Pen Needle 29G X 5MM MISC Use once daily   levothyroxine (SYNTHROID) 50 MCG tablet Take 1 tablet (50 mcg total) by mouth daily.   loratadine (CLARITIN) 10 MG tablet Take 10 mg by mouth daily as needed for allergies.   metFORMIN (GLUCOPHAGE-XR) 500 MG 24 hr tablet Take 1 tablet (500 mg total) by mouth 2 (two) times daily.   metoprolol succinate (TOPROL-XL) 25 MG 24 hr tablet Take 1 tablet (25 mg total) by mouth daily.   omeprazole (PRILOSEC) 20 MG capsule Take 1 capsule (20 mg total) by mouth 2 (two) times daily.   ONE TOUCH ULTRA TEST test strip CHECK ONCE A DAY   potassium chloride (KLOR-CON) 10 MEQ tablet TAKE ONE TABLET BY MOUTH EVERY MORNING   torsemide (DEMADEX) 10 MG tablet TAKE ONE TABLET BY MOUTH EVERY MORNING   traZODone (DESYREL) 50 MG tablet TAKE 1/2 TO 1 TABLET BY MOUTH AT BEDTIME AS NEEDED FOR SLEEP   vitamin B-12 (CYANOCOBALAMIN) 1000 MCG tablet Take 1 tablet (1,000 mcg total) by mouth daily.   No facility-administered  encounter medications on file as of 01/03/2021.   Received call from patient regarding medication management via [facility].  Patient requested an acute fill for Tresiba Flextouch 100 units to be delivered:07.01.2022 Pharmacy needs refills? No  Confirmed delivery date of 07.01.2022, advised patient that pharmacy will contact them the morning of delivery.  Star Rating Drugs: Medication Dispensed Quantity Pharmacy  Metformin 500 mg 03.03.2022 [place] Revonda Standard, [place] Pharmacist Assistant [phone]

## 2021-01-13 ENCOUNTER — Other Ambulatory Visit: Payer: Self-pay | Admitting: Obstetrics & Gynecology

## 2021-01-13 DIAGNOSIS — R921 Mammographic calcification found on diagnostic imaging of breast: Secondary | ICD-10-CM

## 2021-01-20 ENCOUNTER — Telehealth: Payer: Self-pay | Admitting: Pharmacist

## 2021-01-20 NOTE — Chronic Care Management (AMB) (Signed)
    Chronic Care Management Pharmacy Assistant   Name: Julie Norton  MRN: [id number] DOB: 19-Nov-1929  Reason for Encounter: Medication Review- Acute medication refill   Recent office visits:  None  Recent consult visits:  None  Hospital visits:  None in previous 6 months  Medications: Outpatient Encounter Medications as of 01/20/2021  Medication Sig   acetaminophen (TYLENOL) 500 MG tablet Take 1,000 mg by mouth every 6 (six) hours as needed for mild pain or moderate pain.    CALCIUM PO Take 1 tablet by mouth daily.   D-3-5 125 MCG (5000 UT) capsule TAKE ONE CAPSULE BY MOUTH EVERY MORNING   ELIQUIS 5 MG TABS tablet TAKE ONE TABLET every morning AND TABLET dinner   glucose blood (ONE TOUCH ULTRA TEST) test strip Check once daily. E11.40   insulin degludec (TRESIBA FLEXTOUCH) 100 UNIT/ML FlexTouch Pen INJECT 24 UNITS TOTAL INTO THE SKIN DAILY AT 10PM (Patient taking differently: INJECT 17 UNITS TOTAL INTO THE SKIN DAILY AT 10PM)   Insulin Pen Needle 29G X 5MM MISC Use once daily   levothyroxine (SYNTHROID) 50 MCG tablet Take 1 tablet (50 mcg total) by mouth daily.   loratadine (CLARITIN) 10 MG tablet Take 10 mg by mouth daily as needed for allergies.   metFORMIN (GLUCOPHAGE-XR) 500 MG 24 hr tablet Take 1 tablet (500 mg total) by mouth 2 (two) times daily.   metoprolol succinate (TOPROL-XL) 25 MG 24 hr tablet Take 1 tablet (25 mg total) by mouth daily.   omeprazole (PRILOSEC) 20 MG capsule Take 1 capsule (20 mg total) by mouth 2 (two) times daily.   ONE TOUCH ULTRA TEST test strip CHECK ONCE A DAY   potassium chloride (KLOR-CON) 10 MEQ tablet TAKE ONE TABLET BY MOUTH EVERY MORNING   torsemide (DEMADEX) 10 MG tablet TAKE ONE TABLET BY MOUTH EVERY MORNING   traZODone (DESYREL) 50 MG tablet TAKE 1/2 TO 1 TABLET BY MOUTH AT BEDTIME AS NEEDED FOR SLEEP   vitamin B-12 (CYANOCOBALAMIN) 1000 MCG tablet Take 1 tablet (1,000 mcg total) by mouth daily.   No facility-administered encounter  medications on file as of 01/20/2021.   Received call from patient regarding medication management via [facility].  Patient requested an acute fill for the following medication to be delivered: 07.019.2022 Trazodone (DESYREL) 50 MG tablet: take 1/2 tablet to one tablet by mouth at bedtime as needed for sleep ELIQUIS 5 MG TABS tablet: one tablet in the morning and one at dinner Pharmacy needs refills? No  Confirmed delivery date of 07.19.2022, advised patient that pharmacy will contact them the morning of delivery.  Care Gaps: Zoster Vaccines Foot Exam PNA  Covid -19 Urine Albumin  Star Rating Drugs: Medication Dispensed Quantity Pharmacy  Metformin 500 mg 05.19.2022 [facility]    Amilia Revonda Standard, [place] Pharmacist Assistant [phone]

## 2021-01-21 ENCOUNTER — Other Ambulatory Visit: Payer: Self-pay

## 2021-01-21 ENCOUNTER — Ambulatory Visit (INDEPENDENT_AMBULATORY_CARE_PROVIDER_SITE_OTHER): Payer: Medicare Other | Admitting: Family Medicine

## 2021-01-21 VITALS — BP 120/58 | HR 78 | Temp 98.7°F | Wt 131.6 lb

## 2021-01-21 DIAGNOSIS — D649 Anemia, unspecified: Secondary | ICD-10-CM

## 2021-01-21 DIAGNOSIS — E039 Hypothyroidism, unspecified: Secondary | ICD-10-CM | POA: Diagnosis not present

## 2021-01-21 DIAGNOSIS — E1165 Type 2 diabetes mellitus with hyperglycemia: Secondary | ICD-10-CM

## 2021-01-21 DIAGNOSIS — F5104 Psychophysiologic insomnia: Secondary | ICD-10-CM

## 2021-01-21 MED ORDER — FLUCONAZOLE 150 MG PO TABS: 150.0000 mg | ORAL_TABLET | Freq: Once | ORAL | 0 refills | Status: AC

## 2021-01-21 NOTE — Progress Notes (Signed)
Established Patient Office Visit  Subjective:  Patient ID: Julie Norton, female    DOB: August 03, 1929  Age: 87 y.o. MRN: [id number]  CC: No chief complaint on file.   HPI Julie Norton presents for medical follow-up.  She has history of atrial fibrillation, hypothyroidism, type 2 diabetes, peripheral neuropathy, degenerative arthritis involving her cervical and lumbar spine, remote history of breast cancer, hyperlipidemia with previous intolerance with multiple statins.  She also has some chronic insomnia  She is followed by orthopedics and has had multiple steroid injections in her cervical spine recently.  Minimal improvement.  Back difficulties have limited her activity somewhat.  She thinks her blood sugars are can be elevated because of the many injections.  Not monitoring regularly.  She remains on Tresiba 24 units daily and metformin once daily.  Last A1c 7.8%  Has had some recent vaginal itching.  History of yeast vaginitis in the past.  Previous intolerance with Invokana.  No burning with urination.  Requesting refill of fluconazole which has helped in the past  Chronic insomnia.  She takes trazodone 50 mg at night which does help somewhat.  History of hypothyroidism.  On levothyroxine 50 mcg daily.  Compliant with medication.  She has history of chronic anemia.  She has history of chronic bilateral leg edema and has done well since switching from furosemide to torsemide.  Edema currently stable with current dosage of torsemide.  She is also on potassium replacement.  Past Medical History:  Diagnosis Date   Abdominal adhesions    Abdominal gas pain    suspected -- may be related to intermittent right upper quadrant discomfort radiating around her back       Anemia    Anginal pain (HCC)    Arthritis    Atrial fibrillation (HCC)    Breast cancer (HCC)    Breast mass    left breast   Cancer (HCC)    left breast   Chest pain    intermittent right upper quadrant  discomfort radiating around her back       Complication of anesthesia    Diabetes mellitus without complication (HCC)    Type II   Dyspepsia    Dyspnea    GERD (gastroesophageal reflux disease)    H/O: hysterectomy 1978   History of cardioversion    Hypercholesteremia    Hypothyroidism    Mitral insufficiency    Personal history of radiation therapy    Pneumonia    PONV (postoperative nausea and vomiting)    Right bundle branch block (RBBB)    Tendinitis    of the right arm and shoulder   Torn tendon    right shoulder    Past Surgical History:  Procedure Laterality Date   ABDOMINAL HYSTERECTOMY     APPENDECTOMY  1978   BLADDER REPAIR     BREAST LUMPECTOMY Left 02/24/2011   central partial mastectomy - dr Margot Chimes   BREAST REDUCTION SURGERY     CARDIOVERSION N/A 10/28/2017   Procedure: CARDIOVERSION;  Surgeon: Lelon Perla, MD;  Location: [facility];  Service: Cardiovascular;  Laterality: N/A;   CARDIOVERSION N/A 04/06/2019   Procedure: CARDIOVERSION;  Surgeon: [name], MD;  Location: [place];  Service: Cardiovascular;  Laterality: N/A;   CARDIOVERSION N/A 09/08/2019   Procedure: CARDIOVERSION;  Surgeon: Dorothy Spark, MD;  Location: [place];  Service: Cardiovascular;  Laterality: N/A;   COLONOSCOPY WITH PROPOFOL N/A 03/24/2017   Procedure: COLONOSCOPY WITH PROPOFOL;  Surgeon: Michail Sermon,  Evette Doffing, MD;  Location: [facility] END[facility]COPY;  Service: Endoscopy;  Laterality: N/A;   ESOPHAGOGASTRODUODENOSCOPY (EGD) WITH PROPOFOL N/A 03/24/2017   Procedure: ESOPHAGOGASTRODUODENOSCOPY (EGD) WITH PROPOFOL;  Surgeon: Wilford Corner, MD;  Location: [place];  Service: Endoscopy;  Laterality: N/A;   LIPOMA EXCISION Right    REDUCTION MAMMAPLASTY Bilateral 1998   RETINAL DETACHMENT SURGERY Left    SALPINGOOPHORECTOMY     TEE WITHOUT CARDIOVERSION N/A 10/28/2017   Procedure: TRANSESOPHAGEAL ECHOCARDIOGRAM (TEE);  Surgeon: Lelon Perla, MD;  Location: [facility]  ENDOSCOPY;  Service: Cardiovascular;  Laterality: N/A;   [place]   at 87 years of age    Family History  Problem Relation Age of Onset   Pneumonia Father    Heart attack Mother    Hypertension Mother    Stroke Mother    Diabetes Mother    Aortic aneurysm Mother        abdominal aortic aneurysm   Cancer Mother        bladder   Aortic aneurysm Sister        abdominal aortic aneurysm    Social History   Socioeconomic History   Marital status: Married    Spouse name: Not on file   Number of children: Not on file   Years of education: Not on file   Highest education level: Not on file  Occupational History   Not on file  Tobacco Use   Smoking status: Never   Smokeless tobacco: Never  Vaping Use   Vaping Use: Never used  Substance and Sexual Activity   Alcohol use: No   Drug use: No   Sexual activity: Not on file  Other Topics Concern   Not on file  Social History Narrative   Not on file   Social Determinants of Health   Financial Resource Strain: Low Risk    Difficulty of Paying Living Expenses: Not hard at all  Food Insecurity: No Food Insecurity   Worried About Charity fundraiser in the Last Year: Never true   [place] in the Last Year: Never true  Transportation Needs: No Transportation Needs   Lack of Transportation (Medical): No   Lack of Transportation (Non-Medical): No  Physical Activity: Inactive   Days of Exercise per Week: 0 days   Minutes of Exercise per Session: 0 min  Stress: No Stress Concern Present   Feeling of Stress : Only a little  Social Connections: Moderately Isolated   Frequency of Communication with Friends and Family: More than three times a week   Frequency of Social Gatherings with Friends and Family: Once a week   Attends Religious Services: Never   Marine scientist or Organizations: No   Attends Music therapist: Never   Marital Status: Married  Human resources officer Violence:  Not At Risk   Fear of Current or Ex-Partner: No   Emotionally Abused: No   Physically Abused: No   Sexually Abused: No    Outpatient Medications Prior to Visit  Medication Sig Dispense Refill   acetaminophen (TYLENOL) 500 MG tablet Take 1,000 mg by mouth every 6 (six) hours as needed for mild pain or moderate pain.      CALCIUM PO Take 1 tablet by mouth daily.     D-3-5 125 MCG (5000 UT) capsule TAKE ONE CAPSULE BY MOUTH EVERY MORNING 90 capsule 0   ELIQUIS 5 MG TABS tablet TAKE ONE TABLET every morning AND TABLET dinner 180 tablet 1  glucose blood (ONE TOUCH ULTRA TEST) test strip Check once daily. E11.40 100 each 11   insulin degludec (TRESIBA FLEXTOUCH) 100 UNIT/ML FlexTouch Pen INJECT 24 UNITS TOTAL INTO THE SKIN DAILY AT 10PM (Patient taking differently: INJECT 17 UNITS TOTAL INTO THE SKIN DAILY AT 10PM) 100 mL 1   Insulin Pen Needle 29G X 5MM MISC Use once daily 100 each 3   levothyroxine (SYNTHROID) 50 MCG tablet Take 1 tablet (50 mcg total) by mouth daily. 90 tablet 1   loratadine (CLARITIN) 10 MG tablet Take 10 mg by mouth daily as needed for allergies.     metFORMIN (GLUCOPHAGE-XR) 500 MG 24 hr tablet Take 1 tablet (500 mg total) by mouth 2 (two) times daily. 180 tablet 1   metoprolol succinate (TOPROL-XL) 25 MG 24 hr tablet Take 1 tablet (25 mg total) by mouth daily. 90 tablet 3   omeprazole (PRILOSEC) 20 MG capsule Take 1 capsule (20 mg total) by mouth 2 (two) times daily. 180 capsule 1   ONE TOUCH ULTRA TEST test strip CHECK ONCE A DAY 50 each 3   potassium chloride (KLOR-CON) 10 MEQ tablet TAKE ONE TABLET BY MOUTH EVERY MORNING 90 tablet 0   torsemide (DEMADEX) 10 MG tablet TAKE ONE TABLET BY MOUTH EVERY MORNING 30 tablet 1   traZODone (DESYREL) 50 MG tablet TAKE 1/2 TO 1 TABLET BY MOUTH AT BEDTIME AS NEEDED FOR SLEEP 90 tablet 1   vitamin B-12 (CYANOCOBALAMIN) 1000 MCG tablet Take 1 tablet (1,000 mcg total) by mouth daily. 90 tablet 3   No facility-administered medications  prior to visit.    Allergies  Allergen Reactions   Bactrim Nausea And Vomiting   Dilaudid [Hydromorphone Hcl] Other (See Comments)    Unknown   Hydrocodone Nausea And Vomiting   Invokana [Canagliflozin] Itching and Other (See Comments)    Yeast Infections    Macrodantin Nausea And Vomiting    ROS Review of Systems  Constitutional:  Positive for fatigue. Negative for appetite change and unexpected weight change.  Eyes:  Negative for visual disturbance.  Respiratory:  Negative for cough, chest tightness, shortness of breath and wheezing.   Cardiovascular:  Negative for chest pain, palpitations and leg swelling.  Neurological:  Negative for dizziness, seizures, syncope, weakness, light-headedness and headaches.     Objective:    Physical Exam Constitutional:      Appearance: She is well-developed.  Eyes:     Pupils: Pupils are equal, round, and reactive to light.  Neck:     Thyroid: No thyromegaly.     Vascular: No JVD.  Cardiovascular:     Rate and Rhythm: Normal rate.     Heart sounds:    No gallop.  Pulmonary:     Effort: Pulmonary effort is normal. No respiratory distress.     Breath sounds: Normal breath sounds. No wheezing or rales.  Musculoskeletal:     Cervical back: Neck supple.     Right lower leg: No edema.     Left lower leg: No edema.  Neurological:     Mental Status: She is alert.    BP (!) 120/58 (BP Location: Left Arm, Patient Position: Sitting, Cuff Size: Normal)   Pulse 78   Temp 98.7 F (37.1 C) (Oral)   Wt 131 lb 9.6 oz (59.7 kg)   SpO2 98%   BMI 24.87 kg/m  Wt Readings from Last 3 Encounters:  01/21/21 131 lb 9.6 oz (59.7 kg)  10/29/20 133 lb 3.2 oz (60.4 kg)  09/16/20 136 lb (61.7 kg)     Health Maintenance Due  Topic Date Due   Zoster Vaccines- Shingrix (1 of 2) Never done   URINE MICROALBUMIN  04/07/2014   FOOT EXAM  08/29/2015   PNA vac Low Risk Adult (2 of 2 - PPSV23) 03/03/2017   COVID-19 Vaccine (3 - Mixed Product risk  series) 12/01/2019    There are no preventive care reminders to display for this patient.  Lab Results  Component Value Date   TSH 2.08 04/22/2020   Lab Results  Component Value Date   WBC 4.7 02/19/2020   HGB 9.4 (L) 02/19/2020   HCT 31.5 (L) 02/19/2020   MCV 80.8 02/19/2020   PLT 158 02/19/2020   Lab Results  Component Value Date   NA 141 06/13/2020   K 5.0 06/13/2020   CHLORIDE 111 (H) 04/03/2014   CO2 23 06/13/2020   GLUCOSE 178 (H) 06/13/2020   BUN 13 06/13/2020   CREATININE 0.81 06/13/2020   BILITOT 0.4 12/11/2019   ALKPHOS 87 12/11/2019   AST 39 12/11/2019   ALT 32 12/11/2019   PROT 6.4 12/11/2019   ALBUMIN 4.1 12/11/2019   CALCIUM 9.4 06/13/2020   ANIONGAP 8 02/19/2020   GFR 70.88 06/23/2019   Lab Results  Component Value Date   CHOL 94 06/23/2019   Lab Results  Component Value Date   HDL 35.20 (L) 06/23/2019   Lab Results  Component Value Date   LDLCALC 45 06/23/2019   Lab Results  Component Value Date   TRIG 70.0 06/23/2019   Lab Results  Component Value Date   CHOLHDL 3 06/23/2019   Lab Results  Component Value Date   HGBA1C 7.8 (A) 07/29/2020      Assessment & Plan:   #1 type 2 diabetes with history of peripheral neuropathy.  Patient currently treated with [name] and metformin  -Recheck A1c -If A1c still up consider getting back on twice daily metformin  #2 hypothyroidism -Recheck TSH  #3 vaginal pruritus.  History of frequent Candida vaginitis -Fluconazole 150 mg x 1 dose  #4 chronic atrial fibrillation treated with Eliquis  #5 history of chronic anemia. -Recheck CBC   Meds ordered this encounter  Medications   fluconazole (DIFLUCAN) 150 MG tablet    Sig: Take 1 tablet (150 mg total) by mouth once for 1 dose.    Dispense:  1 tablet    Refill:  0    Follow-up: Return in about 6 months (around 07/24/2021).    [name], MD

## 2021-01-22 LAB — CBC WITH DIFFERENTIAL/PLATELET
Basophils Absolute: 0 10*3/uL (ref 0.0–0.1)
Basophils Relative: 0.5 % (ref 0.0–3.0)
Eosinophils Absolute: 0.1 10*3/uL (ref 0.0–0.7)
Eosinophils Relative: 1.8 % (ref 0.0–5.0)
HCT: 30.4 % — ABNORMAL LOW (ref 36.0–46.0)
Hemoglobin: 10.1 g/dL — ABNORMAL LOW (ref 12.0–15.0)
Lymphocytes Relative: 24.9 % (ref 12.0–46.0)
Lymphs Abs: 1.5 10*3/uL (ref 0.7–4.0)
MCHC: 33.1 g/dL (ref 30.0–36.0)
MCV: 71.3 fl — ABNORMAL LOW (ref 78.0–100.0)
Monocytes Absolute: 0.5 10*3/uL (ref 0.1–1.0)
Monocytes Relative: 7.8 % (ref 3.0–12.0)
Neutro Abs: 3.8 10*3/uL (ref 1.4–7.7)
Neutrophils Relative %: 65 % (ref 43.0–77.0)
Platelets: 147 10*3/uL — ABNORMAL LOW (ref 150.0–400.0)
RBC: 4.26 Mil/uL (ref 3.87–5.11)
RDW: 16 % — ABNORMAL HIGH (ref 11.5–15.5)
WBC: 5.9 10*3/uL (ref 4.0–10.5)

## 2021-01-22 LAB — BASIC METABOLIC PANEL
BUN: 17 mg/dL (ref 6–23)
CO2: 27 mEq/L (ref 19–32)
Calcium: 9.6 mg/dL (ref 8.4–10.5)
Chloride: 103 mEq/L (ref 96–112)
Creatinine, Ser: 0.93 mg/dL (ref 0.40–1.20)
GFR: 54.77 mL/min — ABNORMAL LOW (ref >60.00)
Glucose, Bld: 289 mg/dL — ABNORMAL HIGH (ref 70–99)
Potassium: 4 mEq/L (ref 3.5–5.1)
Sodium: 139 mEq/L (ref 135–145)

## 2021-01-22 LAB — TSH: TSH: 0.96 u[IU]/mL (ref 0.35–5.50)

## 2021-01-22 LAB — HEMOGLOBIN A1C: Hgb A1c MFr Bld: 10.5 % — ABNORMAL HIGH (ref 4.6–6.5)

## 2021-01-24 ENCOUNTER — Other Ambulatory Visit: Payer: Self-pay

## 2021-01-29 ENCOUNTER — Other Ambulatory Visit: Payer: Self-pay | Admitting: *Deleted

## 2021-01-29 MED ORDER — APIXABAN 5 MG PO TABS: ORAL_TABLET | ORAL | 0 refills | Status: DC

## 2021-02-07 ENCOUNTER — Ambulatory Visit: Payer: Self-pay | Admitting: Allergy

## 2021-02-09 ENCOUNTER — Other Ambulatory Visit: Payer: Self-pay | Admitting: Family Medicine

## 2021-02-10 ENCOUNTER — Telehealth: Payer: Self-pay | Admitting: Pharmacist

## 2021-02-10 NOTE — Chronic Care Management (AMB) (Signed)
Chronic Care Management Pharmacy Assistant   Name: Julie Norton  MRN: [id number] DOB: [birth year]  Reason for Encounter: Medication Review/ Medication Coordination Call.     Recent office visits:  01/21/21 [name] MD (PCP) - seen for type 2 diabetes and other chronic conditions. Patietn started on fluconazole '150mg'$  once. Follow up in 6 months.   Recent consult visits:  None.   Hospital visits:  None in previous 6 months  Medications: Outpatient Encounter Medications as of 02/10/2021  Medication Sig   acetaminophen (TYLENOL) 500 MG tablet Take 1,000 mg by mouth every 6 (six) hours as needed for mild pain or moderate pain.    apixaban (ELIQUIS) 5 MG TABS tablet TAKE ONE TABLET every morning AND TABLET dinner   CALCIUM PO Take 1 tablet by mouth daily.   D-3-5 125 MCG (5000 UT) capsule TAKE ONE CAPSULE BY MOUTH EVERY MORNING   glucose blood (ONE TOUCH ULTRA TEST) test strip Check once daily. E11.40   insulin degludec (TRESIBA FLEXTOUCH) 100 UNIT/ML FlexTouch Pen INJECT 24 UNITS TOTAL INTO THE SKIN DAILY AT 10PM (Patient taking differently: INJECT 17 UNITS TOTAL INTO THE SKIN DAILY AT 10PM)   Insulin Pen Needle 29G X 5MM MISC Use once daily   levothyroxine (SYNTHROID) 50 MCG tablet Take 1 tablet (50 mcg total) by mouth daily.   loratadine (CLARITIN) 10 MG tablet Take 10 mg by mouth daily as needed for allergies.   metFORMIN (GLUCOPHAGE-XR) 500 MG 24 hr tablet Take 1 tablet (500 mg total) by mouth 2 (two) times daily.   metoprolol succinate (TOPROL-XL) 25 MG 24 hr tablet Take 1 tablet (25 mg total) by mouth daily.   omeprazole (PRILOSEC) 20 MG capsule Take 1 capsule (20 mg total) by mouth 2 (two) times daily.   ONE TOUCH ULTRA TEST test strip CHECK ONCE A DAY   potassium chloride (KLOR-CON) 10 MEQ tablet TAKE ONE TABLET BY MOUTH EVERY MORNING   torsemide (DEMADEX) 10 MG tablet TAKE ONE TABLET BY MOUTH WITH A MEAL   traZODone (DESYREL) 50 MG tablet TAKE 1/2 TO 1 TABLET BY  MOUTH AT BEDTIME AS NEEDED FOR SLEEP   vitamin B-12 (CYANOCOBALAMIN) 1000 MCG tablet Take 1 tablet (1,000 mcg total) by mouth daily.   No facility-administered encounter medications on file as of 02/10/2021.   Fill History: Eliquis 5 mg tablet 01/20/2021 30   cholecalciferol (vitamin D3) 125 mcg (5,000 unit) capsule 11/21/2020 90   cyanocobalamin (vit B-12) 1,000 mcg tablet 11/21/2020 90   Tresiba FlexTouch U-100 insulin 100 unit/mL (3 mL) subcutaneous pen 02/03/2021 25   LEVOTHYROXINE SODIUM  50 MCG TABS 09/18/2020 90   metoprolol succinate ER 25 mg tablet,extended release 24 hr 11/21/2020 90   OMEPRAZOLE  20 MG CPDR 09/18/2020 90   potassium chloride ER 10 mEq tablet,extended release 11/21/2020 90   torsemide 10 mg tablet 11/21/2020 90   METFORMIN HYDROCHLORIDE ER  500 MG TB24 09/05/2020 90    trazodone 50 mg tablet 01/20/2021 30    Reviewed chart for medication changes ahead of medication coordination call.  No OVs, Consults, or hospital visits since last care coordination call/Pharmacist visit. (If appropriate, list visit date, provider name)  No medication changes indicated OR if recent visit, treatment plan here.  BP Readings from Last 3 Encounters:  01/21/21 (!) 120/58  10/29/20 124/72  09/16/20 (!) 128/54    Lab Results  Component Value Date   HGBA1C 10.5 (H) 01/21/2021     Patient obtains medications through  Adherence Packaging  90 Days   Last adherence delivery included:  Patient has received from [facility] previously her Eliquis '5mg'$   on 01/20/21 30DS, [name] flextouch u-100 pen on 02/03/21 25DS supply.  Patient is due for next adherence delivery on: 02/19/21. Called patient and reviewed medications and coordinated delivery.  This delivery to include: Tresiba - takes 24 units before breakfast Vitamin D3 5000 units - daily with breakfast Potassium ER 10MEQ - daily with breakfast Torsemide '10mg'$  - daily with breakfast Calcium - vitamin D3 600 - daily with  evening meal Metoprolol ER '25mg'$  - daily with breakfast Vitamin B-12 '1000mg'$  - daily with evening meal Trazodone '50mg'$  - 1/2-1 tablet at bedtime as needed Levothyroxine 32mg - take 1 tablet before breakfast. Omeprazole '20mg'$  -  1 tablet daily before breakfast and 1 tablet before evening meal  Patient declined the following medications due to having a 2 month supply on on her eliquis due to being given samples by Dr. BElease Hashimoto A 1 month supply on hand of her metformin and omeprazole. Patient states the Metformin does has been changed from twice a day to once a day and now back to twice a day due to stomach upset a few times and so she has an abundance on hand at this time. Patient also only wants a 30 day supply of her medications in this delivery. Eliquis '5mg'$  - take 1 tablet with breakfast and 1 tablet with dinner Metformin '500mg'$  - 1 tablet with breakfast and 1 tablet with evening meal  Confirmed delivery date of 02/19/21, advised patient that pharmacy will contact them the morning of delivery.   Care Gaps:  AWV - scheduled for 09/17/21 Zoster vaccines - never done Urine microalbumin - overdue since 04/07/14 Foot exam - overdue since 08/29/15 Pneumonia vaccine - overdue since 03/03/17 Covid-19 vaccine booster 3 overdue since 12/01/19 Influenza vaccine - due since 02/03/21  Star Rating Drugs:  Metformin '500mg'$  - [facility][phone]

## 2021-02-18 ENCOUNTER — Other Ambulatory Visit: Payer: Self-pay | Admitting: Family Medicine

## 2021-02-21 NOTE — Progress Notes (Signed)
Cardiology Office Note   Date:  02/24/2021   ID:  Julie Norton, DOB [birth year], MRN [id number]  PCP:  Eulas Post, MD  Cardiologist: Virda Betters Martinique MD  Chief Complaint  Patient presents with   Atrial Fibrillation       History of Present Illness: Julie Norton is a 87 y.o. female who presents for follow up of Atrial fibrillation.   She has a past history of hypercholesterolemia. She also has a history of DM on  insulin. Echo in 2015 showed mild MR with normal LV function. She had a normal Myoview study in November 2017.   She was seen by Dr. Elease Hashimoto on 09/20/17. Found to be in Afib with rate 95. Started on Eliquis. Follow up Echo showed mild biatrial enlargement and normal LV function. She  noted some fatigue, decreased energy and leg swelling. Some dyspnea and chest tightness. Was started on lasix with improvement of these symptoms. She subsequently underwent TEE guided DCCV on 10/28/17 with return to NSR.   Was seen in early September 2020 with  increased leg swelling for 2 months. Noted SOB going up stairs. Was found to be in Afib with rate 105. Toprol XL was added for rate control. Lasix was increased.  We did try her on Flecainide but this resulted in severe nausea and diarrhea and was discontinued.   She was seen in the ED on 03/27/19 with complaints of epigastric pain/abdominal pain. Mildly elevated lipase. Other labs OK. Ecg in Afib with rate 80. CXR and CT abdomen were normal.  She subsequently underwent successful DCCV on 04/06/19. Seen back on October 27 and was in NSR. Dr Burchette's note of Dec 18 thought she was back in Afib but no Ecg done. Rate controlled. When seen in our office she was in Afib.  She was started on amiodarone  at 400 mg daily. She underwent repeat DCCV on March 5 successfully. Due to some nausea amiodarone was reduced to 200 mg daily.    She did develop side effects on amiodarone and this was discontinued in December. She reports feeling  much better afterwards.  Repeat Echo showed normal LV function. There was increased RA size and TR. BNP was 180. UA negative for proteinuria.   Her husband just passed away a couple of weeks ago. Still grieving. Notes some dizziness when first lies down. Notes balance isn't good. Has done home PT. Edema is better. No dyspnea or SOB.      Past Medical History:  Diagnosis Date   Abdominal adhesions    Abdominal gas pain    suspected -- may be related to intermittent right upper quadrant discomfort radiating around her back       Anemia    Anginal pain (HCC)    Arthritis    Atrial fibrillation (HCC)    Breast cancer (HCC)    Breast mass    left breast   Cancer ([place])    left breast   Chest pain    intermittent right upper quadrant discomfort radiating around her back       Complication of anesthesia    Diabetes mellitus without complication (HCC)    Type II   Dyspepsia    Dyspnea    GERD (gastroesophageal reflux disease)    H/O: hysterectomy 1978   History of cardioversion    Hypercholesteremia    Hypothyroidism    Mitral insufficiency    Personal history of radiation therapy    Pneumonia  PONV (postoperative nausea and vomiting)    Right bundle branch block (RBBB)    Tendinitis    of the right arm and shoulder   Torn tendon    right shoulder    Past Surgical History:  Procedure Laterality Date   ABDOMINAL HYSTERECTOMY     APPENDECTOMY  1978   BLADDER REPAIR     BREAST LUMPECTOMY Left 02/24/2011   central partial mastectomy - dr Margot Chimes   BREAST REDUCTION SURGERY     CARDIOVERSION N/A 10/28/2017   Procedure: CARDIOVERSION;  Surgeon: Lelon Perla, MD;  Location[facility] ENDOSCOPY;  Service: Cardiovascular;  Laterality: N/A;   CARDIOVERSION N/A 04/06/2019   Procedure: CARDIOVERSION;  Surgeon: [name], MD;  Location: [facility] ENDOSCOPY;  Service: Cardiovascular;  Laterality: N/A;   CARDIOVERSION N/A 09/08/2019   Procedure: CARDIOVERSION;  Surgeon: Dorothy Spark, MD;  Location: [place];  Service: Cardiovascular;  Laterality: N/A;   COLONOSCOPY WITH PROPOFOL N/A 03/24/2017   Procedure: COLONOSCOPY WITH PROPOFOL;  Surgeon: Wilford Corner, MD;  Location: [facility] ENDOSCOPY;  Service: Endoscopy;  Laterality: N/A;   ESOPHAGOGASTRODUODENOSCOPY (EGD) WITH PROPOFOL N/A 03/24/2017   Procedure: ESOPHAGOGASTRODUODENOSCOPY (EGD) WITH PROPOFOL;  Surgeon: Wilford Corner, MD;  Location: [place];  Service: Endoscopy;  Laterality: N/A;   LIPOMA EXCISION Right    REDUCTION MAMMAPLASTY Bilateral 1998   RETINAL DETACHMENT SURGERY Left    SALPINGOOPHORECTOMY     TEE WITHOUT CARDIOVERSION N/A 10/28/2017   Procedure: TRANSESOPHAGEAL ECHOCARDIOGRAM (TEE);  Surgeon: Lelon Perla, MD;  Location: [facility] ENDOSCOPY;  Service: Cardiovascular;  Laterality: N/A;   [place]   at 87 years of age     Current Outpatient Medications  Medication Sig Dispense Refill   acetaminophen (TYLENOL) 500 MG tablet Take 1,000 mg by mouth every 6 (six) hours as needed for mild pain or moderate pain.      apixaban (ELIQUIS) 5 MG TABS tablet TAKE ONE TABLET every morning AND TABLET dinner 70 tablet 0   CALCIUM PO Take 1 tablet by mouth daily.     Cholecalciferol (VITAMIN D3) 125 MCG (5000 UT) CAPS TAKE ONE CAPSULE BY MOUTH EVERY MORNING 90 capsule 0   glucose blood (ONE TOUCH ULTRA TEST) test strip Check once daily. E11.40 100 each 11   insulin degludec (TRESIBA FLEXTOUCH) 100 UNIT/ML FlexTouch Pen INJECT 24 UNITS TOTAL INTO THE SKIN DAILY AT 10PM (Patient taking differently: INJECT 17 UNITS TOTAL INTO THE SKIN DAILY AT 10PM) 100 mL 1   Insulin Pen Needle 29G X 5MM MISC Use once daily 100 each 3   levothyroxine (SYNTHROID) 50 MCG tablet Take 1 tablet (50 mcg total) by mouth daily. 90 tablet 1   loratadine (CLARITIN) 10 MG tablet Take 10 mg by mouth daily as needed for allergies.     metFORMIN (GLUCOPHAGE-XR) 500 MG 24 hr tablet Take 1 tablet (500 mg  total) by mouth 2 (two) times daily. (Patient taking differently: Take 500 mg by mouth daily.) 180 tablet 1   metoprolol succinate (TOPROL-XL) 25 MG 24 hr tablet Take 1 tablet (25 mg total) by mouth daily. 90 tablet 3   omeprazole (PRILOSEC) 20 MG capsule Take 1 capsule (20 mg total) by mouth 2 (two) times daily. 180 capsule 1   ONE TOUCH ULTRA TEST test strip CHECK ONCE A DAY 50 each 3   potassium chloride (KLOR-CON) 10 MEQ tablet TAKE ONE TABLET BY MOUTH EVERY MORNING 90 tablet 0   torsemide (DEMADEX) 10 MG tablet TAKE ONE TABLET  BY MOUTH WITH A MEAL 30 tablet 3   traZODone (DESYREL) 50 MG tablet TAKE 1/2 TO 1 TABLET BY MOUTH AT BEDTIME AS NEEDED FOR SLEEP 90 tablet 1   vitamin B-12 (CYANOCOBALAMIN) 1000 MCG tablet Take 1 tablet (1,000 mcg total) by mouth daily. 90 tablet 3   No current facility-administered medications for this visit.    Allergies:   Bactrim, Dilaudid [hydromorphone hcl], Hydrocodone, Invokana [canagliflozin], and Macrodantin    Social History:  The patient  reports that she has never smoked. She has never used smokeless tobacco. She reports that she does not drink alcohol and does not use drugs.   Family History:  The patient's family history includes Aortic aneurysm in her mother and sister; Cancer in her mother; Diabetes in her mother; Heart attack in her mother; Hypertension in her mother; Pneumonia in her father; Stroke in her mother.    ROS:  Please see the history of present illness.   Otherwise, review of systems are positive for none.   All other systems are reviewed and negative.    PHYSICAL EXAM: VS:  BP 112/60 (BP Location: Left Arm)   Pulse 75   Ht '5\' 1"'$  (1.549 m)   Wt 134 lb 3.2 oz (60.9 kg)   SpO2 98%   BMI 25.36 kg/m  , BMI Body mass index is 25.36 kg/m. GENERAL:  Well appearing WF  In NAD HEENT:  PERRL, EOMI, sclera are clear. Oropharynx is clear. NECK:  No jugular venous distention, carotid upstroke brisk and symmetric, no bruits, no thyromegaly  or adenopathy LUNGS:  Clear to auscultation bilaterally CHEST:  Unremarkable HEART:  RRR,  PMI not displaced or sustained,S1 and S2 within normal limits, no S3, no S4: no clicks, no rubs, there is a soft 1-2/6 SEM ABD:  Soft, nontender. BS +, no masses or bruits. No hepatomegaly, no splenomegaly EXT:  2 + pulses throughout, Tr edema, no cyanosis no clubbing SKIN:  Warm and dry.  No rashes NEURO:  Alert and oriented x 3. Cranial nerves II through XII intact. PSYCH:  Cognitively intact  EKG:  EKG is  ordered today. NSR with first degree AV block with very long PR 332 msec. RBBB. I have personally reviewed and interpreted this study.    Recent Labs: 06/13/2020: BNP 179.9 01/21/2021: BUN 17; Creatinine, Ser 0.93; Hemoglobin 10.1; Platelets 147.0; Potassium 4.0; Sodium 139; TSH 0.96    Lipid Panel    Component Value Date/Time   CHOL 94 06/23/2019 0825   CHOL 135 04/26/2017 0840   TRIG 70.0 06/23/2019 0825   HDL 35.20 (L) 06/23/2019 0825   HDL 45 04/26/2017 0840   CHOLHDL 3 06/23/2019 0825   VLDL 14.0 06/23/2019 0825   LDLCALC 45 06/23/2019 0825   LDLCALC 70 04/26/2017 0840      Wt Readings from Last 3 Encounters:  02/24/21 134 lb 3.2 oz (60.9 kg)  01/21/21 131 lb 9.6 oz (59.7 kg)  10/29/20 133 lb 3.2 oz (60.4 kg)      Lexiscan myoview 05/26/16:Study Highlights   Nuclear stress EF: 72%. There was no ST segment deviation noted during stress. This is a low risk study. The left ventricular ejection fraction is hyperdynamic (>65%).   1. Small, mild mid-anteroseptal perfusion defect.  This defect was actually more intense at rest than with stress.  No ischemia.  Suspect attenuation.  2. EF 72% with normal wall motion.  3. Overall low risk study.      Echo 09/24/17: Study Conclusions   -  Left ventricle: The cavity size was normal. Systolic function was   normal. The estimated ejection fraction was in the range of 55%   to 60%. Wall motion was normal; there were no regional  wall   motion abnormalities. - Mitral valve: There was mild regurgitation. - Left atrium: The atrium was mildly dilated. - Right atrium: The atrium was mildly dilated. - Atrial septum: No defect or patent foramen ovale was identified. - Pulmonary arteries: Systolic pressure was mildly increased. PA   peak pressure: 37 mm Hg (S).    Echo 06/24/20: IMPRESSIONS     1. Left ventricular ejection fraction, by estimation, is 60 to 65%. The  left ventricle has normal function. The left ventricle has no regional  wall motion abnormalities. Left ventricular diastolic parameters are  consistent with Grade II diastolic  dysfunction (pseudonormalization).   2. Right ventricular systolic function is mildly reduced. The right  ventricular size is mildly enlarged. There is normal pulmonary artery  systolic pressure.   3. Right atrial size was severely dilated.   4. The mitral valve is normal in structure. Trivial mitral valve  regurgitation.   5. Tricuspid valve regurgitation is moderate.   6. The aortic valve is tricuspid. There is mild thickening of the aortic  valve. Aortic valve regurgitation is not visualized. No aortic stenosis is  present.   7. Aortic dilatation noted. There is mild dilatation of the aortic root,  measuring 36 mm. There is mild dilatation of the ascending aorta,  measuring 37 mm.   8. The inferior vena cava is dilated in size with <50% respiratory  variability, suggesting right atrial pressure of 15 mmHg.   Comparison(s): Compared to prior study in 2019, there is now moderate TR.  Otherwise there is no significant change   ASSESSMENT AND PLAN:  1. Atrial fibrillation. Recurrent. [place] vasc score of 4. On Eliquis. Rate controlled on Toprol XL.  S/p TEE guided DCCV in April 2019. Repeat DCCV on April 06, 2019 with ERAD. She was intolerant of Flecainide. She was symptomatic with her Afib so was started on amiodarone. S/p  successful repeat DCCV on September 08, 2019.   Amiodarone was discontinued in December due to side effects. Feeling much better now and is till in NSR. If she does have recurrent Afib will need to consider other alternatives.   2.  Chronic diastolic CHF.  This has improved with Torsemide. Encourage sodium restriction, elevation of feet and compression hose. Echo was unremarkable except for moderate TR.   3. Hypercholesterolemia. On low dose lipitor.   4. Diabetes mellitus- now on insulin.    5. History of Breast CA.   6. RBBB and first degree AV block with long PR. Asymptomatic.   Follow up in 6 months.  Signed, Erick Murin Martinique MD, [facility]   02/24/2021 3:59 PM    [place]

## 2021-02-24 ENCOUNTER — Encounter: Payer: Self-pay | Admitting: Cardiology

## 2021-02-24 ENCOUNTER — Ambulatory Visit: Payer: Medicare Other | Admitting: Cardiology

## 2021-02-24 ENCOUNTER — Other Ambulatory Visit: Payer: Self-pay

## 2021-02-24 VITALS — BP 112/60 | HR 75 | Ht 61.0 in | Wt 134.2 lb

## 2021-02-24 DIAGNOSIS — I4510 Unspecified right bundle-branch block: Secondary | ICD-10-CM | POA: Diagnosis not present

## 2021-02-24 DIAGNOSIS — I480 Paroxysmal atrial fibrillation: Secondary | ICD-10-CM | POA: Diagnosis not present

## 2021-02-24 DIAGNOSIS — I5032 Chronic diastolic (congestive) heart failure: Secondary | ICD-10-CM

## 2021-02-26 ENCOUNTER — Other Ambulatory Visit: Payer: Self-pay

## 2021-02-26 ENCOUNTER — Ambulatory Visit
Admission: RE | Admit: 2021-02-26 | Discharge: 2021-02-26 | Disposition: A | Discharge status: Home / Self Care | Payer: Medicare Other | Source: Ambulatory Visit | Attending: Obstetrics & Gynecology | Admitting: Obstetrics & Gynecology

## 2021-02-26 DIAGNOSIS — Z853 Personal history of malignant neoplasm of breast: Secondary | ICD-10-CM | POA: Diagnosis not present

## 2021-02-26 DIAGNOSIS — R921 Mammographic calcification found on diagnostic imaging of breast: Secondary | ICD-10-CM

## 2021-02-26 DIAGNOSIS — R922 Inconclusive mammogram: Secondary | ICD-10-CM | POA: Diagnosis not present

## 2021-03-04 ENCOUNTER — Ambulatory Visit: Payer: Medicare Other | Admitting: Medical

## 2021-03-13 ENCOUNTER — Telehealth: Payer: Self-pay | Admitting: Pharmacist

## 2021-03-13 NOTE — Chronic Care Management (AMB) (Signed)
Chronic Care Management Pharmacy Assistant   Name: Julie Norton  MRN: [id number] DOB: 28-Dec-1929  Reason for Encounter: Medication Review/ Medication Coordination Call.    Recent office visits:  None.  Recent consult visits:  02/24/21 Peter Martinique MD (Cardiology) - seen for paroxysmal A-FIB and other issues. No medication changes. Follow up in 6 months.   Hospital visits:  None in previous 6 months  Medications: Outpatient Encounter Medications as of 03/13/2021  Medication Sig   acetaminophen (TYLENOL) 500 MG tablet Take 1,000 mg by mouth every 6 (six) hours as needed for mild pain or moderate pain.    apixaban (ELIQUIS) 5 MG TABS tablet TAKE ONE TABLET every morning AND TABLET dinner   CALCIUM PO Take 1 tablet by mouth daily.   Cholecalciferol (VITAMIN D3) 125 MCG (5000 UT) CAPS TAKE ONE CAPSULE BY MOUTH EVERY MORNING   glucose blood (ONE TOUCH ULTRA TEST) test strip Check once daily. E11.40   insulin degludec (TRESIBA FLEXTOUCH) 100 UNIT/ML FlexTouch Pen INJECT 24 UNITS TOTAL INTO THE SKIN DAILY AT 10PM (Patient taking differently: INJECT 17 UNITS TOTAL INTO THE SKIN DAILY AT 10PM)   Insulin Pen Needle 29G X 5MM MISC Use once daily   levothyroxine (SYNTHROID) 50 MCG tablet Take 1 tablet (50 mcg total) by mouth daily.   loratadine (CLARITIN) 10 MG tablet Take 10 mg by mouth daily as needed for allergies.   metFORMIN (GLUCOPHAGE-XR) 500 MG 24 hr tablet Take 1 tablet (500 mg total) by mouth 2 (two) times daily. (Patient taking differently: Take 500 mg by mouth daily.)   metoprolol succinate (TOPROL-XL) 25 MG 24 hr tablet Take 1 tablet (25 mg total) by mouth daily.   omeprazole (PRILOSEC) 20 MG capsule Take 1 capsule (20 mg total) by mouth 2 (two) times daily.   ONE TOUCH ULTRA TEST test strip CHECK ONCE A DAY   potassium chloride (KLOR-CON) 10 MEQ tablet TAKE ONE TABLET BY MOUTH EVERY MORNING   torsemide (DEMADEX) 10 MG tablet TAKE ONE TABLET BY MOUTH WITH A MEAL   traZODone  (DESYREL) 50 MG tablet TAKE 1/2 TO 1 TABLET BY MOUTH AT BEDTIME AS NEEDED FOR SLEEP   vitamin B-12 (CYANOCOBALAMIN) 1000 MCG tablet Take 1 tablet (1,000 mcg total) by mouth daily.   No facility-administered encounter medications on file as of 03/13/2021.   Fill History: Eliquis 5 mg tablet 01/20/2021 30   cholecalciferol (vitamin D3) 125 mcg (5,000 unit) capsule 11/21/2020 90   cyanocobalamin (vit B-12) 1,000 mcg tablet 02/18/2021 30   Tresiba FlexTouch U-100 insulin 100 unit/mL (3 mL) subcutaneous pen 02/26/2021 28   levothyroxine 50 mcg tablet 02/18/2021 30   metoprolol succinate ER 25 mg tablet,extended release 24 hr 02/18/2021 30   omeprazole 20 mg capsule,delayed release 02/18/2021 30   potassium chloride ER 10 mEq tablet,extended release 02/18/2021 30   torsemide 10 mg tablet 02/18/2021 30   METFORMIN HYDROCHLORIDE ER  500 MG TB24 09/05/2020 90   trazodone 50 mg tablet 02/18/2021 30   Recent Relevant Labs: Lab Results  Component Value Date/Time   HGBA1C 10.5 (H) 01/21/2021 03:43 PM   HGBA1C 7.8 (A) 07/29/2020 01:37 PM   HGBA1C 9.1 (H) 04/22/2020 04:17 PM   MICROALBUR 0.9 04/07/2013 07:58 AM    Kidney Function Lab Results  Component Value Date/Time   CREATININE 0.93 01/21/2021 03:43 PM   CREATININE 0.81 06/13/2020 02:15 PM   CREATININE 0.81 04/22/2020 04:17 PM   CREATININE 0.69 07/30/2016 08:08 AM   CREATININE 0.8  04/03/2014 10:26 AM   CREATININE 0.8 03/20/2013 09:10 AM   GFR 54.77 (L) 01/21/2021 03:43 PM   GFRNONAA 65 06/13/2020 02:15 PM   GFRAA 75 06/13/2020 02:15 PM    Current antihyperglycemic regimen:  [name] 100unit - inject 24 units daily at 10pm. Metformin '500mg'$  - take 1 tablet by mouth once daily now due to GI upset at breakfast. What recent interventions/DTPs have been made to improve glycemic control:  None. Have there been any recent hospitalizations or ED visits since last visit with CPP? No Patient denies hypoglycemic symptoms, including  None Patient denies hyperglycemic symptoms, including none How often are you checking your blood sugar? once daily What are your blood sugars ranging?  Fasting: 122 this am, [id number] and 145 on 9/7. 9/6 was 152. Patient has been under some stress due to losing her husband recently.  During the week, how often does your blood glucose drop below 70? Never Are you checking your feet daily/regularly? Yes. Checks them daily when she puts lotion on.  Adherence Review: Is the patient currently on a STATIN medication? No Is the patient currently on ACE/ARB medication? No Does the patient have >5 day gap between last estimated fill dates? No   Reviewed chart for medication changes ahead of medication coordination call.  No OVs, Consults, or hospital visits since last care coordination call/Pharmacist visit. (If appropriate, list visit date, provider name)  No medication changes indicated OR if recent visit, treatment plan here.  BP Readings from Last 3 Encounters:  02/24/21 112/60  01/21/21 (!) 120/58  10/29/20 124/72    Lab Results  Component Value Date   HGBA1C 10.5 (H) 01/21/2021     Patient obtains medications through Adherence Packaging  30 Days   Last adherence delivery included: Tresiba - takes 24 units before breakfast Vitamin D3 5000 units - daily with breakfast Potassium ER 10MEQ - daily with breakfast Torsemide '10mg'$  - daily with breakfast Calcium - vitamin D3 600 - daily with evening meal Metoprolol ER '25mg'$  - daily with breakfast Vitamin B-12 '1000mg'$  - daily with evening meal Trazodone '50mg'$  - 1/2-1 tablet at bedtime as needed Levothyroxine 24mg - take 1 tablet before breakfast. Omeprazole '20mg'$  -  1 tablet daily before breakfast and 1 tablet before evening meal  Patient declined the following medications due to having a 2 month supply on on her eliquis due to being given samples by Dr. BElease Hashimoto A 1 month supply on hand of her metformin and omeprazole. Patient states  the Metformin does has been changed from twice a day to once a day and now back to twice a day due to stomach upset a few times and so she has an abundance on hand at this time. Patient also only wants a 30 day supply of her medications in this delivery. Eliquis '5mg'$  - take 1 tablet with breakfast and 1 tablet with dinner Metformin '500mg'$  - 1 tablet with breakfast and 1 tablet with evening meal  Patient is due for next adherence delivery on: 03/21/21. Called patient and reviewed medications and coordinated delivery.  This delivery to include: TTyler Aas- takes 24 units before breakfast Potassium ER 10MEQ - daily with breakfast Metoprolol ER '25mg'$  - daily with breakfast Trazodone '50mg'$  - 1/2-1 tablet at bedtime as needed Levothyroxine 573m - take 1 tablet before breakfast. Omeprazole '20mg'$  -  1 tablet daily before breakfast and 1 tablet before evening meal  Patient declined the following medications due to having samples of the Eliquis from her doctor due to  being in donut hole and she has torsemide left due to having it changed by doctor as well as her Metformin. She was changed to once daily on the metformin due to GI upset. Patient is getting her vitamins for free now.  Eliquis '5mg'$  - take 1 tablet with breakfast and 1 tablet with dinner Metformin '500mg'$  - 1 tablet with breakfast and 1 tablet with evening meal Torsemide '10mg'$  - daily with breakfast Vitamin B-12 '1000mg'$  - daily with evening meal Calcium - vitamin D3 600 - daily with evening meal Vitamin D3 5000 units - daily with breakfast  Confirmed delivery date of 03/21/21, advised patient that pharmacy will contact them the morning of delivery.   Notes: Spoke with patient and reviewed medications as listed for what is needed in her upcoming delivery and what's not. Patient reports that she is now only taking her metformin once daily due to GI upset and she has some left as well as torsemide due to being adjusted. Patient reports no other issues  from her medications at this time. Patient stated she has been under some stress since her husband passed. Patient stated for breakfast she tends to eat a bowl of oatmeal with blueberries, brown sugar and a teaspoon of maple syrup. Sometimes she has eggs and grits. For lunch patient may have leftovers from the night before and sometimes nothing since she tends to have a later breakfast. Patient eats mostly chicken and fish with potatoes or green beans. Patient has 3 glasses of water a day. Patient is currently using a cane and walker and is limited on activity but she is able to cook and clean around her house. Patient thanked me for my call.   Care Gaps:  AWV - scheduled for 09/17/21 Zoster vaccines - never done Urine microalbumin - overdue since 04/07/14 Foot exam - overdue since 08/29/15 Pneumonia vaccine - overdue since 03/03/17 Covid-19 vaccine booster 3 overdue since 12/01/19 Influenza vaccine - due since 02/03/21    Star Rating Drugs:  Metformin '500mg'$  - last filled on 11/21/20 90DS at [place] Pharmacist Assistant [phone]

## 2021-04-03 DIAGNOSIS — M546 Pain in thoracic spine: Secondary | ICD-10-CM | POA: Diagnosis not present

## 2021-04-03 DIAGNOSIS — M5136 Other intervertebral disc degeneration, lumbar region: Secondary | ICD-10-CM | POA: Diagnosis not present

## 2021-04-06 ENCOUNTER — Other Ambulatory Visit: Payer: Self-pay | Admitting: Family Medicine

## 2021-04-09 ENCOUNTER — Telehealth: Payer: Self-pay | Admitting: Pharmacist

## 2021-04-09 NOTE — Chronic Care Management (AMB) (Addendum)
Chronic Care Management Pharmacy Assistant   Name: Julie Norton  MRN: [id number] DOB: 1929/10/09  Reason for Encounter: Medication Review/ Medication Coordination Call.     Recent office visits:  None.  Recent consult visits:  None.  Hospital visits:  None in previous 6 months  Medications: Outpatient Encounter Medications as of 04/09/2021  Medication Sig   acetaminophen (TYLENOL) 500 MG tablet Take 1,000 mg by mouth every 6 (six) hours as needed for mild pain or moderate pain.    apixaban (ELIQUIS) 5 MG TABS tablet TAKE ONE TABLET every morning AND TABLET dinner   CALCIUM PO Take 1 tablet by mouth daily.   Cholecalciferol (VITAMIN D3) 125 MCG (5000 UT) CAPS TAKE ONE CAPSULE BY MOUTH EVERY MORNING   glucose blood (ONE TOUCH ULTRA TEST) test strip Check once daily. E11.40   insulin degludec (TRESIBA FLEXTOUCH) 100 UNIT/ML FlexTouch Pen Inject 24 units into THE SKIN ONCE daily AT 10pm   Insulin Pen Needle 29G X 5MM MISC Use once daily   levothyroxine (SYNTHROID) 50 MCG tablet Take 1 tablet (50 mcg total) by mouth daily.   loratadine (CLARITIN) 10 MG tablet Take 10 mg by mouth daily as needed for allergies.   metFORMIN (GLUCOPHAGE-XR) 500 MG 24 hr tablet Take 1 tablet (500 mg total) by mouth 2 (two) times daily. (Patient taking differently: Take 500 mg by mouth daily.)   metoprolol succinate (TOPROL-XL) 25 MG 24 hr tablet Take 1 tablet (25 mg total) by mouth daily.   omeprazole (PRILOSEC) 20 MG capsule Take 1 capsule (20 mg total) by mouth 2 (two) times daily.   ONE TOUCH ULTRA TEST test strip CHECK ONCE A DAY   potassium chloride (KLOR-CON) 10 MEQ tablet TAKE ONE TABLET BY MOUTH EVERY MORNING   torsemide (DEMADEX) 10 MG tablet TAKE ONE TABLET BY MOUTH WITH A MEAL   traZODone (DESYREL) 50 MG tablet TAKE 1/2 TABLET - ONE tab BY MOUTH AT bedtime AS NEEDED FOR SLEEP   vitamin B-12 (CYANOCOBALAMIN) 1000 MCG tablet Take 1 tablet (1,000 mcg total) by mouth daily.   No  facility-administered encounter medications on file as of 04/09/2021.   Fill history: Eliquis 5 mg tablet 03/31/2021 23   cholecalciferol (vitamin D3) 125 mcg (5,000 unit) capsule 11/21/2020 90   cyanocobalamin (vit B-12) 1,000 mcg tablet 02/18/2021 30   Tresiba FlexTouch U-100 insulin 100 unit/mL (3 mL) subcutaneous pen 03/19/2021 25   levothyroxine 50 mcg tablet 03/17/2021 30   metoprolol succinate ER 25 mg tablet,extended release 24 hr 03/17/2021 30   omeprazole 20 mg capsule,delayed release 03/17/2021 30   potassium chloride ER 10 mEq tablet,extended release 03/17/2021 30   torsemide 10 mg tablet 02/18/2021 30   prednisone 5 mg tablet 04/04/2021 30   trazodone 50 mg tablet 03/17/2021 30   Reviewed chart for medication changes ahead of medication coordination call.  No OVs, Consults, or hospital visits since last care coordination call/Pharmacist visit. (If appropriate, list visit date, provider name)  No medication changes indicated OR if recent visit, treatment plan here.  BP Readings from Last 3 Encounters:  02/24/21 112/60  01/21/21 (!) 120/58  10/29/20 124/72    Lab Results  Component Value Date   HGBA1C 10.5 (H) 01/21/2021     Patient obtains medications through Adherence Packaging  30 Days   Last adherence delivery included: Tresiba - takes 24 units before breakfast Potassium ER 10MEQ - daily with breakfast Metoprolol ER 25mg  - daily with breakfast Trazodone 50mg  - 1/2-1  tablet at bedtime as needed Levothyroxine 12mcg - take 1 tablet before breakfast. Omeprazole 20mg  -  1 tablet daily before breakfast and 1 tablet before evening meal   Patient declined the following medications due to having samples of the Eliquis from her doctor due to being in donut [place] and she has torsemide left due to having it changed by doctor as well as her Metformin. She was changed to once daily on the metformin due to GI upset. Patient is getting her vitamins for free now.   Eliquis 5mg  - take 1 tablet with breakfast and 1 tablet with dinner Metformin 500mg  - 1 tablet with breakfast and 1 tablet with evening meal Torsemide 10mg  - daily with breakfast Vitamin B-12 1000mg  - daily with evening meal Calcium - vitamin D3 600 - daily with evening meal Vitamin D3 5000 units - daily with breakfast  Patient is due for next adherence delivery on: 04/21/21. Called patient and reviewed medications and coordinated delivery.  This delivery to include: [name] - takes 24 units before breakfast Potassium ER 10MEQ - daily with breakfast Metoprolol ER 25mg  - daily with breakfast Trazodone 50mg  - 1/2-1 tablet at bedtime as needed Levothyroxine 73mcg - take 1 tablet before breakfast. Omeprazole 20mg  -  1 tablet daily before breakfast and 1 tablet before evening meal  Eliquis 5mg  - take 1 tablet with breakfast and 1 tablet with dinner  Patient declined the following medications due to taking torsemide as needed. She still has some Metformin where it was changed to once daily on the metformin due to GI upset. Patient is getting her vitamins for free now.   Metformin 500mg  - 1 tablet with breakfast and 1 tablet with evening meal Torsemide 10mg  - daily with breakfast Vitamin B-12 1000mg  - daily with evening meal Calcium - vitamin D3 600 - daily with evening meal Vitamin D3 5000 units - daily with breakfast   Confirmed delivery date of 04/21/21, advised patient that pharmacy will contact them the morning of delivery.   Care Gaps:  AWV - scheduled for 09/17/21 Zoster vaccines - never done Urine microalbumin - overdue since 04/07/14 Foot exam - overdue since 08/29/15 Pneumonia vaccine - overdue since 03/03/17 Covid-19 vaccine booster 3 overdue since 12/01/19 Influenza vaccine - due since 02/03/21  Star Rating Drugs:  Metformin 500mg  - last filled on 11/21/20 90DS at [facility] Pharmacist Assistant [phone]

## 2021-05-07 ENCOUNTER — Other Ambulatory Visit: Payer: Self-pay

## 2021-05-07 ENCOUNTER — Ambulatory Visit (INDEPENDENT_AMBULATORY_CARE_PROVIDER_SITE_OTHER): Payer: Medicare Other | Admitting: Family Medicine

## 2021-05-07 VITALS — BP 120/60 | HR 75 | Temp 98.3°F | Wt 131.7 lb

## 2021-05-07 DIAGNOSIS — R0789 Other chest pain: Secondary | ICD-10-CM

## 2021-05-07 DIAGNOSIS — R29898 Other symptoms and signs involving the musculoskeletal system: Secondary | ICD-10-CM | POA: Diagnosis not present

## 2021-05-07 DIAGNOSIS — Z23 Encounter for immunization: Secondary | ICD-10-CM

## 2021-05-07 DIAGNOSIS — E1165 Type 2 diabetes mellitus with hyperglycemia: Secondary | ICD-10-CM | POA: Diagnosis not present

## 2021-05-07 NOTE — Patient Instructions (Signed)
Set up follow up with dentist if drooling persists  I would consider repeat trial of physical therapy- let me know where you would like to go and I will set up.

## 2021-05-07 NOTE — Progress Notes (Signed)
Established Patient Office Visit  Subjective:  Patient ID: Julie Norton, female    DOB: 07/17/1929  Age: 87 y.o. MRN: [id number]  CC:  Chief Complaint  Patient presents with   Follow-up    HPI Julie Norton presents for multiple issues as indicated below.  She lost her husband after 46 years of marriage this past year and this has naturally been very difficult for her.  She has periods of grieving still and is having some difficulty coping.  Has very supportive family.  Her chronic problems include history of atrial fibrillation, type 2 diabetes, hypothyroidism, peripheral neuropathy, osteoarthritis, scoliosis, chronic insomnia, history of breast cancer.  She relates intermittent drooling from the corners of her mouth.  No angular cheilitis.  She does have dentures and wonders if there may be issue with the fit there.  She has not had any sore throat or any dysphagia.  She relates the pain just under her right anterior rib cage area.  Worse with movement.  This is over the right upper quadrant but she is not any nausea or vomiting or relation to meals.  No known injury.  Worse with change of position.  She is followed by orthopedics and recently saw her orthopedist whom she sees for chronic back pain.  She denies any recent constipation or any other stool changes.  No rashes.  Becoming more debilitated with regard to her arthritis.  She has had therapy in the past.  Current pain is 5 out of 10 in severity  She states last week she had a day where her blood pressure "dropped ".  She was actually was referring to the diastolic but not the systolic.  She did not have any associated dizziness.  She denies any orthostatic symptoms at this time.  Patient also relates losing grip strength right hand.  Right-hand-dominant.  Denies any hand numbness.  Does have severe arthritis in her hands as well as neck but denies any cervical radiculitis symptoms.  She does have type 2 diabetes and has  had multiple recent steroid injections.  Her diabetes been poorly controlled.  She declines A1c today.  She states her recent fastings have been mostly between 130 and 150.  Past Medical History:  Diagnosis Date   Abdominal adhesions    Abdominal gas pain    suspected -- may be related to intermittent right upper quadrant discomfort radiating around her back       Anemia    Anginal pain (HCC)    Arthritis    Atrial fibrillation (HCC)    Breast cancer (HCC)    Breast mass    left breast   Cancer ([place])    left breast   Chest pain    intermittent right upper quadrant discomfort radiating around her back       Complication of anesthesia    Diabetes mellitus without complication (HCC)    Type II   Dyspepsia    Dyspnea    GERD (gastroesophageal reflux disease)    H/O: hysterectomy 1978   History of cardioversion    Hypercholesteremia    Hypothyroidism    Mitral insufficiency    Personal history of radiation therapy    Pneumonia    PONV (postoperative nausea and vomiting)    Right bundle branch block (RBBB)    Tendinitis    of the right arm and shoulder   Torn tendon    right shoulder    Past Surgical History:  Procedure Laterality Date  ABDOMINAL HYSTERECTOMY     APPENDECTOMY  1978   BLADDER REPAIR     BREAST LUMPECTOMY Left 02/24/2011   central partial mastectomy - dr Margot Chimes   BREAST REDUCTION SURGERY     CARDIOVERSION N/A 10/28/2017   Procedure: CARDIOVERSION;  Surgeon: Lelon Perla, MD;  Location: [facility] ENDOSCOPY;  Service: Cardiovascular;  Laterality: N/A;   CARDIOVERSION N/A 04/06/2019   Procedure: CARDIOVERSION;  Surgeon: [name], MD;  Location: [facility] ENDOSCOPY;  Service: Cardiovascular;  Laterality: N/A;   CARDIOVERSION N/A 09/08/2019   Procedure: CARDIOVERSION;  Surgeon: Dorothy Spark, MD;  Location: [place];  Service: Cardiovascular;  Laterality: N/A;   COLONOSCOPY WITH PROPOFOL N/A 03/24/2017   Procedure: COLONOSCOPY WITH PROPOFOL;   Surgeon: Wilford Corner, MD;  Location: [place];  Service: Endoscopy;  Laterality: N/A;   ESOPHAGOGASTRODUODENOSCOPY (EGD) WITH PROPOFOL N/A 03/24/2017   Procedure: ESOPHAGOGASTRODUODENOSCOPY (EGD) WITH PROPOFOL;  Surgeon: Wilford Corner, MD;  Location: [place];  Service: Endoscopy;  Laterality: N/A;   LIPOMA EXCISION Right    REDUCTION MAMMAPLASTY Bilateral 1998   RETINAL DETACHMENT SURGERY Left    SALPINGOOPHORECTOMY     TEE WITHOUT CARDIOVERSION N/A 10/28/2017   Procedure: TRANSESOPHAGEAL ECHOCARDIOGRAM (TEE);  Surgeon: Lelon Perla, MD;  Location: [facility] ENDOSCOPY;  Service: Cardiovascular;  Laterality: N/A;   [place]   at 87 years of age    Family History  Problem Relation Age of Onset   Pneumonia Father    Heart attack Mother    Hypertension Mother    Stroke Mother    Diabetes Mother    Aortic aneurysm Mother        abdominal aortic aneurysm   Cancer Mother        bladder   Aortic aneurysm Sister        abdominal aortic aneurysm    Social History   Socioeconomic History   Marital status: Widowed    Spouse name: Not on file   Number of children: Not on file   Years of education: Not on file   Highest education level: Not on file  Occupational History   Not on file  Tobacco Use   Smoking status: Never   Smokeless tobacco: Never  Vaping Use   Vaping Use: Never used  Substance and Sexual Activity   Alcohol use: No   Drug use: No   Sexual activity: Not on file  Other Topics Concern   Not on file  Social History Narrative   Not on file   Social Determinants of Health   Financial Resource Strain: Low Risk    Difficulty of Paying Living Expenses: Not hard at all  Food Insecurity: No Food Insecurity   Worried About Running Out of Food in the Last Year: Never true   [place] in the Last Year: Never true  Transportation Needs: No Transportation Needs   Lack of Transportation (Medical): No   Lack of  Transportation (Non-Medical): No  Physical Activity: Inactive   Days of Exercise per Week: 0 days   Minutes of Exercise per Session: 0 min  Stress: No Stress Concern Present   Feeling of Stress : Only a little  Social Connections: Moderately Isolated   Frequency of Communication with Friends and Family: More than three times a week   Frequency of Social Gatherings with Friends and Family: Once a week   Attends Religious Services: Never   Marine scientist or Organizations: No   Attends Archivist  Meetings: Never   Marital Status: Married  Human resources officer Violence: Not At Risk   Fear of Current or Ex-Partner: No   Emotionally Abused: No   Physically Abused: No   Sexually Abused: No    Outpatient Medications Prior to Visit  Medication Sig Dispense Refill   acetaminophen (TYLENOL) 500 MG tablet Take 1,000 mg by mouth every 6 (six) hours as needed for mild pain or moderate pain.      apixaban (ELIQUIS) 5 MG TABS tablet TAKE ONE TABLET every morning AND TABLET dinner 70 tablet 0   CALCIUM PO Take 1 tablet by mouth daily.     Cholecalciferol (VITAMIN D3) 125 MCG (5000 UT) CAPS TAKE ONE CAPSULE BY MOUTH EVERY MORNING 90 capsule 0   glucose blood (ONE TOUCH ULTRA TEST) test strip Check once daily. E11.40 100 each 11   insulin degludec (TRESIBA FLEXTOUCH) 100 UNIT/ML FlexTouch Pen Inject 24 units into THE SKIN ONCE daily AT 10pm 15 mL 3   Insulin Pen Needle 29G X 5MM MISC Use once daily 100 each 3   levothyroxine (SYNTHROID) 50 MCG tablet Take 1 tablet (50 mcg total) by mouth daily. 90 tablet 1   loratadine (CLARITIN) 10 MG tablet Take 10 mg by mouth daily as needed for allergies.     metFORMIN (GLUCOPHAGE-XR) 500 MG 24 hr tablet Take 1 tablet (500 mg total) by mouth 2 (two) times daily. (Patient taking differently: Take 500 mg by mouth daily.) 180 tablet 1   metoprolol succinate (TOPROL-XL) 25 MG 24 hr tablet Take 1 tablet (25 mg total) by mouth daily. 90 tablet 3    omeprazole (PRILOSEC) 20 MG capsule Take 1 capsule (20 mg total) by mouth 2 (two) times daily. 180 capsule 1   ONE TOUCH ULTRA TEST test strip CHECK ONCE A DAY 50 each 3   potassium chloride (KLOR-CON) 10 MEQ tablet TAKE ONE TABLET BY MOUTH EVERY MORNING 90 tablet 0   torsemide (DEMADEX) 10 MG tablet TAKE ONE TABLET BY MOUTH WITH A MEAL 30 tablet 3   traZODone (DESYREL) 50 MG tablet TAKE 1/2 TABLET - ONE tab BY MOUTH AT bedtime AS NEEDED FOR SLEEP 90 tablet 1   vitamin B-12 (CYANOCOBALAMIN) 1000 MCG tablet Take 1 tablet (1,000 mcg total) by mouth daily. 90 tablet 3   No facility-administered medications prior to visit.    Allergies  Allergen Reactions   Bactrim Nausea And Vomiting   Dilaudid [Hydromorphone Hcl] Other (See Comments)    Unknown   Hydrocodone Nausea And Vomiting   Invokana [Canagliflozin] Itching and Other (See Comments)    Yeast Infections    Macrodantin Nausea And Vomiting    ROS Review of Systems  Constitutional:  Negative for chills and fever.  HENT:  Negative for sore throat and trouble swallowing.   Respiratory:  Negative for cough and shortness of breath.   Cardiovascular:  Negative for chest pain.  Musculoskeletal:  Positive for arthralgias and back pain.  Neurological:  Negative for dizziness.     Objective:    Physical Exam Vitals reviewed.  HENT:     Mouth/Throat:     Comments: No evidence for angular cheilitis. Cardiovascular:     Rate and Rhythm: Normal rate.  Pulmonary:     Effort: Pulmonary effort is normal.     Breath sounds: Normal breath sounds.  Abdominal:     Palpations: Abdomen is soft. There is no mass.     Tenderness: There is no abdominal tenderness. There is no guarding  or rebound.  Musculoskeletal:     Comments: She has some significant kyphoscoliosis involving the thoracolumbar spine  She does have to have some interosseous muscle wasting in both hands but right greater than left.  Neurological:     Mental Status: She is  alert.     Comments: Decreased grip strength right hand compared to left.    BP 120/60 (BP Location: Left Arm, Patient Position: Sitting, Cuff Size: Normal)   Pulse 75   Temp 98.3 F (36.8 C) (Oral)   Wt 131 lb 11.2 oz (59.7 kg)   SpO2 96%   BMI 24.88 kg/m  Wt Readings from Last 3 Encounters:  05/07/21 131 lb 11.2 oz (59.7 kg)  02/24/21 134 lb 3.2 oz (60.9 kg)  01/21/21 131 lb 9.6 oz (59.7 kg)     Health Maintenance Due  Topic Date Due   Zoster Vaccines- Shingrix (1 of 2) Never done   URINE MICROALBUMIN  04/07/2014   FOOT EXAM  08/29/2015   Pneumonia Vaccine 57+ Years old (2 - PPSV23 if available, else PCV20) 03/03/2017   COVID-19 Vaccine (3 - Mixed Product risk series) 12/01/2019    There are no preventive care reminders to display for this patient.  Lab Results  Component Value Date   TSH 0.96 01/21/2021   Lab Results  Component Value Date   WBC 5.9 01/21/2021   HGB 10.1 (L) 01/21/2021   HCT 30.4 (L) 01/21/2021   MCV 71.3 (L) 01/21/2021   PLT 147.0 (L) 01/21/2021   Lab Results  Component Value Date   NA 139 01/21/2021   K 4.0 01/21/2021   CHLORIDE 111 (H) 04/03/2014   CO2 27 01/21/2021   GLUCOSE 289 (H) 01/21/2021   BUN 17 01/21/2021   CREATININE 0.93 01/21/2021   BILITOT 0.4 12/11/2019   ALKPHOS 87 12/11/2019   AST 39 12/11/2019   ALT 32 12/11/2019   PROT 6.4 12/11/2019   ALBUMIN 4.1 12/11/2019   CALCIUM 9.6 01/21/2021   ANIONGAP 8 02/19/2020   GFR 54.77 (L) 01/21/2021   Lab Results  Component Value Date   CHOL 94 06/23/2019   Lab Results  Component Value Date   HDL 35.20 (L) 06/23/2019   Lab Results  Component Value Date   LDLCALC 45 06/23/2019   Lab Results  Component Value Date   TRIG 70.0 06/23/2019   Lab Results  Component Value Date   CHOLHDL 3 06/23/2019   Lab Results  Component Value Date   HGBA1C 10.5 (H) 01/21/2021      Assessment & Plan:   #1 patient relates some intermittent drooling from the corners of the mouth.   No evidence for angular colitis.  Suspect this may be related to denture issues.  She is not having any difficulty with swallowing secretions.  Recommend close follow-up with dentist regarding this  #2 right upper abdominal and lower rib cage pain.  Suspect this is probably musculoskeletal as symptoms seem to be worse with movement.  Even though this is over her right upper abdominal quadrant she is not describing any likely active gallbladder issues.  Not related to eating.  No reproducible pain on exam. -We recommend she try to get in some physical therapy for further strengthening.  She will check on local therapist close to her house for ease of transportation and will get back with [place] regarding her preferences for that  #3 type 2 diabetes.  History of poor control.  Frequent steroids both orally and through injection recently which  have likely exacerbated.  We offered A1c today but she declined.  Set up diabetic follow-up soon  #4 decreased hand strength.  She has evidence for some interosseous wasting somewhat bilaterally but right greater than left.  Does not describe any numbness or other features to suggest significant ulnar neuropathy. -We discussed that we could do nerve conduction testing to help further pin down but would first recommend some good physical therapy for upper extremity strengthening   No orders of the defined types were placed in this encounter.   Follow-up: No follow-ups on file.    [name], MD

## 2021-05-12 ENCOUNTER — Telehealth: Payer: Self-pay | Admitting: Pharmacist

## 2021-05-12 ENCOUNTER — Telehealth: Payer: Medicare Other

## 2021-05-12 NOTE — Progress Notes (Deleted)
Chronic Care Management Pharmacy Note  05/12/2021 Name:  BELLAMI FARRELLY MRN:  [id number] DOB:  1930/05/14    Subjective: Julie Norton is an 87 y.o. year old female who is a primary patient of Burchette, Elberta Fortis, MD.  The CCM team was consulted for assistance with disease management and care coordination needs.    Engaged with patient by telephone for follow up visit in response to provider referral for pharmacy case management and/or care coordination services.   Consent to Services:  The patient was given information about Chronic Care Management services, agreed to services, and gave verbal consent prior to initiation of services.  Please see initial visit note for detailed documentation.   Patient Care Team: Kristian Covey, MD as PCP - General (Family Medicine) [place], Peter M, MD as PCP - Cardiology (Cardiology) Cyndia Bent, MD as Surgeon (General Surgery) Freddy Finner, MD (Obstetrics and Gynecology) Verner Chol, [facility] as Pharmacist (Pharmacist)  Recent office visits: 10/29/20 Evelena Peat, MD: Patient presented for chronic conditions follow up. Patient requested to avoid repeat A1c today due to recent steroid injection. Refilled fluconazole for yeast infection symptoms.  09/11/20 Lanier Ensign, LPN: Patient presented for AWV.  07/29/20 Evelena Peat, MD: Patient presented for DM follow up. Decreased Tresiba to 17 units due to hypoglycemia. A1c improved to 7.8% with a goal < 8%.   06/19/20 Evelena Peat, MD: Patient presented for edema and DM follow up. Follow up in 2 months.   05/28/20 Evelena Peat, MD: Patient presented for edema follow up. Continue torsemide and potassium. Recheck BMP today.  Recent consult visits: 09/27/20 Jene Every ([facility]): Unable to access notes.  09/16/20 Peter [place], MD (cardiology): Patient presented for A fib follow up with complaints of headaches and edema. No medication changes.  07/30/20 Blima Ledger  (optometry): Unable to access notes.  Hospital visits: None in previous 6 months  Objective:  Lab Results  Component Value Date   CREATININE 0.93 01/21/2021   BUN 17 01/21/2021   GFR 54.77 (L) 01/21/2021   GFRNONAA 65 06/13/2020   GFRAA 75 06/13/2020   NA 139 01/21/2021   K 4.0 01/21/2021   CALCIUM 9.6 01/21/2021   CO2 27 01/21/2021   GLUCOSE 289 (H) 01/21/2021    Lab Results  Component Value Date/Time   HGBA1C 10.5 (H) 01/21/2021 03:43 PM   HGBA1C 7.8 (A) 07/29/2020 01:37 PM   HGBA1C 9.1 (H) 04/22/2020 04:17 PM   GFR 54.77 (L) 01/21/2021 03:43 PM   GFR 70.88 06/23/2019 08:25 AM   MICROALBUR 0.9 04/07/2013 07:58 AM    Last diabetic Eye exam:  Lab Results  Component Value Date/Time   HMDIABEYEEXA No Retinopathy 07/30/2020 12:00 AM    Last diabetic Foot exam:  Lab Results  Component Value Date/Time   HMDIABFOOTEX normal 08/28/2014 12:00 AM     Lab Results  Component Value Date   CHOL 94 06/23/2019   HDL 35.20 (L) 06/23/2019   LDLCALC 45 06/23/2019   TRIG 70.0 06/23/2019   CHOLHDL 3 06/23/2019    Hepatic Function Latest Ref Rng & Units 12/11/2019 06/23/2019 03/27/2019  Total Protein 6.0 - 8.5 g/dL 6.4 6.6 6.8  Albumin 3.6 - 4.6 g/dL 4.1 4.2 3.8  AST 0 - 40 IU/L 39 16 23  ALT 0 - 32 IU/L 32 9 18  Alk Phosphatase 48 - 121 IU/L 87 60 61  Total Bilirubin 0.0 - 1.2 mg/dL 0.4 0.9 0.7  Bilirubin, Direct 0.0 - 0.3 mg/dL - 0.2 -  Lab Results  Component Value Date/Time   TSH 0.96 01/21/2021 03:43 PM   TSH 2.08 04/22/2020 04:17 PM   FREET4 1.77 12/11/2019 02:54 PM   FREET4 1.92 (H) 09/04/2019 12:49 PM    CBC Latest Ref Rng & Units 01/21/2021 02/19/2020 12/06/2019  WBC 4.0 - 10.5 K/uL 5.9 4.7 5.4  Hemoglobin 12.0 - 15.0 g/dL 10.1(L) 9.4(L) 10.6(L)  Hematocrit 36.0 - 46.0 % 30.4(L) 31.5(L) 31.9(L)  Platelets 150.0 - 400.0 K/uL 147.0(L) 158 185.0    Lab Results  Component Value Date/Time   VD25OH 52 09/07/2011 09:13 AM    Clinical ASCVD: No  The ASCVD Risk  score (Arnett DK, et al., 2019) failed to calculate for the following reasons:   The 2019 ASCVD risk score is only valid for ages 57 to 54    Depression screen PHQ 2/9 09/11/2020 06/23/2019 12/21/2017  Decreased Interest 0 0 0  Down, Depressed, Hopeless 0 0 0  PHQ - 2 Score 0 0 0  Some recent data might be hidden     CHA2DS2/VAS Stroke Risk Points  Current as of a minute ago     5 >= 2 Points: High Risk  1 - 1.99 Points: Medium Risk  0 Points: Low Risk    Last Change: N/A      Details    This score determines the patient's risk of having a stroke if the  patient has atrial fibrillation.       Points Metrics  1 Has Congestive Heart Failure:  Yes    Current as of a minute ago  0 Has Vascular Disease:  No    Current as of a minute ago  0 Has Hypertension:  No    Current as of a minute ago  2 Age:  53    Current as of a minute ago  1 Has Diabetes:  Yes    Current as of a minute ago  0 Had Stroke:  No  Had TIA:  No  Had Thromboembolism:  No    Current as of a minute ago  1 Female:  Yes    Current as of a minute ago     Social History   Tobacco Use  Smoking Status Never  Smokeless Tobacco Never   BP Readings from Last 3 Encounters:  05/07/21 120/60  02/24/21 112/60  01/21/21 (!) 120/58   Pulse Readings from Last 3 Encounters:  05/07/21 75  02/24/21 75  01/21/21 78   Wt Readings from Last 3 Encounters:  05/07/21 131 lb 11.2 oz (59.7 kg)  02/24/21 134 lb 3.2 oz (60.9 kg)  01/21/21 131 lb 9.6 oz (59.7 kg)   BMI Readings from Last 3 Encounters:  05/07/21 24.88 kg/m  02/24/21 25.36 kg/m  01/21/21 24.87 kg/m    Assessment/Interventions: Review of patient past medical history, allergies, medications, health status, including review of consultants reports, laboratory and other test data, was performed as part of comprehensive evaluation and provision of chronic care management services.   SDOH:  (Social Determinants of Health) assessments and interventions  performed: No  SDOH Screenings   Alcohol Screen: Not on file  Depression (PHQ2-9): Low Risk    PHQ-2 Score: 0  Financial Resource Strain: Low Risk    Difficulty of Paying Living Expenses: Not hard at all  Food Insecurity: No Food Insecurity   Worried About Charity fundraiser in the Last Year: Never true   Ran Out of Food in the Last Year: Never true  Housing: Low  Risk    Last Housing Risk Score: 0  Physical Activity: Inactive   Days of Exercise per Week: 0 days   Minutes of Exercise per Session: 0 min  Social Connections: Moderately Isolated   Frequency of Communication with Friends and Family: More than three times a week   Frequency of Social Gatherings with Friends and Family: Once a week   Attends Religious Services: Never   Marine scientist or Organizations: No   Attends Archivist Meetings: Never   Marital Status: Married  Stress: No Stress Concern Present   Feeling of Stress : Only a little  Tobacco Use: Low Risk    Smoking Tobacco Use: Never   Smokeless Tobacco Use: Never   Passive Exposure: Not on file  Transportation Needs: No Transportation Needs   Lack of Transportation (Medical): No   Lack of Transportation (Non-Medical): No    CCM Care Plan  Allergies  Allergen Reactions   Bactrim Nausea And Vomiting   Dilaudid [Hydromorphone Hcl] Other (See Comments)    Unknown   Hydrocodone Nausea And Vomiting   Invokana [Canagliflozin] Itching and Other (See Comments)    Yeast Infections    Macrodantin Nausea And Vomiting    Medications Reviewed Today     Reviewed by Rebecca Eaton, [place] (Certified Medical Assistant) on 05/07/21 at [place] List Status: <None>   Medication Order Taking? Sig Documenting Provider Last Dose Status Informant  acetaminophen (TYLENOL) 500 MG tablet [id number] Yes Take 1,000 mg by mouth every 6 (six) hours as needed for mild pain or moderate pain.  [provider] Taking Active Self  apixaban (ELIQUIS) 5 MG  TABS tablet [id number] Yes TAKE ONE TABLET every morning AND TABLET dinner Eulas Post, MD Taking Active   CALCIUM PO [id number] Yes Take 1 tablet by mouth daily. [provider] Taking Active   Cholecalciferol (VITAMIN D3) 125 MCG (5000 UT) CAPS [id number] Yes TAKE ONE CAPSULE BY MOUTH EVERY MORNING Burchette, Alinda Sierras, MD Taking Active   glucose blood (ONE TOUCH ULTRA TEST) test strip [id number] Yes Check once daily. E11.40 Eulas Post, MD Taking Active Self  insulin degludec (TRESIBA FLEXTOUCH) 100 UNIT/ML FlexTouch Pen [id number] Yes Inject 24 units into THE SKIN ONCE daily AT 10pm Eulas Post, MD Taking Active   Insulin Pen Needle 29G X 5MM MISC [id number] Yes Use once daily Eulas Post, MD Taking Active Self  levothyroxine (SYNTHROID) 50 MCG tablet [id number] Yes Take 1 tablet (50 mcg total) by mouth daily. Eulas Post, MD Taking Active   loratadine (CLARITIN) 10 MG tablet [id number] Yes Take 10 mg by mouth daily as needed for allergies. [provider] Taking Active Self  metFORMIN (GLUCOPHAGE-XR) 500 MG 24 hr tablet [id number] Yes Take 1 tablet (500 mg total) by mouth 2 (two) times daily.  Patient taking differently: Take 500 mg by mouth daily.   Burchette, Alinda Sierras, MD Taking Active   metoprolol succinate (TOPROL-XL) 25 MG 24 hr tablet [id number] Yes Take 1 tablet (25 mg total) by mouth daily. [place], Peter M, MD Taking Active   omeprazole (PRILOSEC) 20 MG capsule [id number] Yes Take 1 capsule (20 mg total) by mouth 2 (two) times daily. Eulas Post, MD Taking Active   ONE TOUCH ULTRA TEST test strip [id number] Yes CHECK ONCE A DAY Eulas Post, MD Taking Active Self  potassium chloride (KLOR-CON) 10 MEQ tablet [id number] Yes TAKE ONE TABLET BY MOUTH EVERY MORNING Burchette, Alinda Sierras, MD Taking  Active   torsemide (DEMADEX) 10 MG tablet [id number] Yes TAKE ONE TABLET BY MOUTH WITH A MEAL Burchette, Alinda Sierras, MD Taking Active   traZODone (DESYREL)  50 MG tablet [id number] Yes TAKE 1/2 TABLET - ONE tab BY MOUTH AT bedtime AS NEEDED FOR SLEEP Burchette, Alinda Sierras, MD Taking Active   vitamin B-12 (CYANOCOBALAMIN) 1000 MCG tablet [id number] Yes Take 1 tablet (1,000 mcg total) by mouth daily. Eulas Post, MD Taking Active             Patient Active Problem List   Diagnosis Date Noted   Chronic insomnia 01/21/2021   Degeneration of lumbar intervertebral disc 02/03/2019   GERD (gastroesophageal reflux disease) 11/30/2017   Other persistent atrial fibrillation ([place])    Injury of muscle of rotator cuff 10/12/2017   Iron deficiency anemia, unspecified 03/24/2017   Poorly controlled type 2 diabetes mellitus ([place]) 02/23/2017   Mitral insufficiency 02/04/2016   Right bundle branch block 05/16/2015   Chest pain of unknown etiology 05/16/2015   Peripheral neuropathy 08/23/2014   Heart murmur previously undiagnosed 01/22/2014   Abdominal pain 11/11/2011   Ductal carcinoma in situ (DCIS) of left breast 01/12/2011   Hypercholesterolemia 11/28/2010   Hypothyroidism 11/28/2010   Postmenopausal state 11/28/2010   Osteoarthritis 11/28/2010   Dyspepsia 11/28/2010    Immunization History  Administered Date(s) Administered   Fluad Quad(high Dose 65+) 05/02/2019, 06/19/2020, 05/07/2021   Influenza Split 07/27/2011   Influenza, High Dose Seasonal PF 05/01/2015, 04/22/2016, 04/22/2017   Influenza,inj,Quad PF,6+ Mos 04/21/2013, 05/07/2014, 03/28/2018   Influenza,inj,quad, With Preservative 04/05/2017   Moderna Sars-Covid-2 Vaccination 10/20/2019, 11/03/2019   Pneumococcal Conjugate-13 03/03/2016   Tdap 08/28/2014    Conditions to be addressed/monitored:  Hyperlipidemia, Diabetes, Atrial Fibrillation, Heart Failure, GERD, Hypothyroidism, Osteoarthritis and Vitamin B12 deficiency  Conditions addressed this visit: ***  There are no care plans that you recently modified to display for this patient.    Medication Assistance: None  required.  Patient affirms current coverage meets needs.  Compliance/Adherence/Medication fill history: Care Gaps: AWV 04-08-21 A1C - 6.2 (03-2020) BP - 110/62 TDAP - Overdue COVID Booster #3 (Pfizer) - Overdue HGB A1C - Overdue Flu Vaccine - Overdue Foot Exam - Overdue   Star-Rating Drugs: Glimepiride (Amaryl) 4 mg - Last filled 01-23-2021 90 [facility] at [facility]  Patient's preferred pharmacy is:  [facility] - [place], [place] - [address] [place] [address] Phone: [phone] Fax: [phone]  [id number] - [place] [address] Phone: [phone] Fax: [phone]  Uses pill box? No - adherence packaging Pt endorses 99% compliance  We discussed: Benefits of medication synchronization, packaging and delivery as well as enhanced pharmacist oversight with [facility]. Patient decided to: Utilize [facility] pharmacy for medication synchronization, packaging and delivery  Care Plan and Follow Up Patient Decision:  Patient agrees to Care Plan and Follow-up.  Plan: Telephone follow up appointment with care management team member scheduled for:  6 months  Jeni Salles, PharmD [place] Pharmacist [place] at [place] [phone]

## 2021-05-12 NOTE — Chronic Care Management (AMB) (Signed)
Chronic Care Management Pharmacy Assistant   Name: Julie Norton  MRN: [id number] DOB: August 05, 1929  Reason for Encounter: Medication Review/ Medication Coordination Call.   Recent office visits:  05/07/21 [name] MD (PCP) - seen for need against influenza and other issues. No medication changes. Follow up with dentist if drooling persists. Consider repeat trial of physical therapy.   Recent consult visits:  None.  Hospital visits:  None in previous 6 months  Medications: Outpatient Encounter Medications as of 05/12/2021  Medication Sig   acetaminophen (TYLENOL) 500 MG tablet Take 1,000 mg by mouth every 6 (six) hours as needed for mild pain or moderate pain.    apixaban (ELIQUIS) 5 MG TABS tablet TAKE ONE TABLET every morning AND TABLET dinner   CALCIUM PO Take 1 tablet by mouth daily.   Cholecalciferol (VITAMIN D3) 125 MCG (5000 UT) CAPS TAKE ONE CAPSULE BY MOUTH EVERY MORNING   glucose blood (ONE TOUCH ULTRA TEST) test strip Check once daily. E11.40   insulin degludec (TRESIBA FLEXTOUCH) 100 UNIT/ML FlexTouch Pen Inject 24 units into THE SKIN ONCE daily AT 10pm   Insulin Pen Needle 29G X 5MM MISC Use once daily   levothyroxine (SYNTHROID) 50 MCG tablet Take 1 tablet (50 mcg total) by mouth daily.   loratadine (CLARITIN) 10 MG tablet Take 10 mg by mouth daily as needed for allergies.   metFORMIN (GLUCOPHAGE-XR) 500 MG 24 hr tablet Take 1 tablet (500 mg total) by mouth 2 (two) times daily. (Patient taking differently: Take 500 mg by mouth daily.)   metoprolol succinate (TOPROL-XL) 25 MG 24 hr tablet Take 1 tablet (25 mg total) by mouth daily.   omeprazole (PRILOSEC) 20 MG capsule Take 1 capsule (20 mg total) by mouth 2 (two) times daily.   ONE TOUCH ULTRA TEST test strip CHECK ONCE A DAY   potassium chloride (KLOR-CON) 10 MEQ tablet TAKE ONE TABLET BY MOUTH EVERY MORNING   torsemide (DEMADEX) 10 MG tablet TAKE ONE TABLET BY MOUTH WITH A MEAL   traZODone (DESYREL) 50 MG  tablet TAKE 1/2 TABLET - ONE tab BY MOUTH AT bedtime AS NEEDED FOR SLEEP   vitamin B-12 (CYANOCOBALAMIN) 1000 MCG tablet Take 1 tablet (1,000 mcg total) by mouth daily.   No facility-administered encounter medications on file as of 05/12/2021.   Fill History: Eliquis 5 mg tablet 04/18/2021 30   cholecalciferol (vitamin D3) 125 mcg (5,000 unit) capsule 11/21/2020 90   cyanocobalamin (vit B-12) 1,000 mcg tablet 02/18/2021 30   Tresiba FlexTouch U-100 insulin 100 unit/mL (3 mL) subcutaneous pen 04/14/2021 25   levothyroxine 50 mcg tablet 04/14/2021 30   metoprolol succinate ER 25 mg tablet,extended release 24 hr 04/14/2021 30   omeprazole 20 mg capsule,delayed release 04/14/2021 30   potassium chloride ER 10 mEq tablet,extended release 04/14/2021 30   torsemide 10 mg tablet 02/18/2021 30   METFORMIN HYDROCHLORIDE ER  500 MG TB24 09/05/2020 90   trazodone 50 mg tablet 04/14/2021 30   Reviewed chart for medication changes ahead of medication coordination call.  No OVs, Consults, or hospital visits since last care coordination call/Pharmacist visit. (If appropriate, list visit date, provider name)  No medication changes indicated OR if recent visit, treatment plan here.  BP Readings from Last 3 Encounters:  05/07/21 120/60  02/24/21 112/60  01/21/21 (!) 120/58    Lab Results  Component Value Date   HGBA1C 10.5 (H) 01/21/2021     Patient obtains medications through Adherence Packaging  30 Days  Last adherence delivery included:  Tresiba - takes 24 units before breakfast Potassium ER 10MEQ - daily with breakfast Metoprolol ER 25mg  - daily with breakfast Trazodone 50mg  - 1/2-1 tablet at bedtime as needed Levothyroxine 20mcg - take 1 tablet before breakfast. Omeprazole 20mg  -  1 tablet daily before breakfast and 1 tablet before evening meal  Eliquis 5mg  - take 1 tablet with breakfast and 1 tablet with dinner   Patient declined the following medications due to taking  torsemide as needed. She still has some Metformin where it was changed to once daily on the metformin due to GI upset. Patient is getting her vitamins for free now.    Metformin 500mg  - 1 tablet with breakfast and 1 tablet with evening meal Torsemide 10mg  - daily with breakfast Vitamin B-12 1000mg  - daily with evening meal Calcium - vitamin D3 600 - daily with evening meal Vitamin D3 5000 units - daily with breakfast   Patient is due for next adherence delivery on: 05/21/21. Called patient and reviewed medications and coordinated delivery.  This delivery to include: [name] - takes 24 units before breakfast Potassium ER 10MEQ - daily with breakfast Metoprolol ER 25mg  - daily with breakfast Trazodone 50mg  - 1/2-1 tablet at bedtime as needed Levothyroxine 90mcg - take 1 tablet before breakfast. Omeprazole 20mg  -  1 tablet daily before breakfast and 1 tablet before evening meal  Eliquis 5mg  - take 1 tablet with breakfast and 1 tablet with dinner  Patient will need a short fill of (med), prior to adherence delivery. (To align with sync date or if PRN med)  Coordinated acute fill for (med) to be delivered (date).  Patient declined the following medications due to taking torsemide as needed. She still has some Metformin where it was changed to once daily on the metformin due to GI upset. Patient is getting her vitamins for free now.    Metformin 500mg  - 1 tablet with breakfast and 1 tablet with evening meal Torsemide 10mg  - daily with breakfast Vitamin B-12 1000mg  - daily with evening meal Calcium - vitamin D3 600 - daily with evening meal Vitamin D3 5000 units - daily with breakfast   Confirmed delivery date of 05/21/21, advised patient that pharmacy will contact them the morning of delivery.   Multiple unsuccessful attempts to reach patient by phone. Patient pulled to step 4 folder with [facility]. Patient past deadline.  Care Gaps:  AWV - scheduled for 09/17/21 Zoster  vaccines - never done Urine microalbumin - overdue since 04/07/14 Foot exam - overdue since 08/29/15 Pneumonia vaccine - overdue since 03/03/17 Covid-19 vaccine booster 3 overdue since 12/01/19 Influenza vaccine - due since 02/03/21  Star Rating Drugs:  Metformin 500mg  - last filled on 11/21/20 90DS at [facility] Pharmacist Assistant [phone]

## 2021-05-19 DIAGNOSIS — Z794 Long term (current) use of insulin: Secondary | ICD-10-CM | POA: Diagnosis not present

## 2021-05-19 DIAGNOSIS — E119 Type 2 diabetes mellitus without complications: Secondary | ICD-10-CM | POA: Diagnosis not present

## 2021-05-27 ENCOUNTER — Telehealth: Payer: Self-pay | Admitting: Family Medicine

## 2021-05-27 DIAGNOSIS — G8929 Other chronic pain: Secondary | ICD-10-CM

## 2021-05-27 NOTE — Telephone Encounter (Signed)
Pt last seen dr Elease Hashimoto on 05-07-2021. Pt is calling to let dr burchette know [facility] [address] phone number [phone] is in her network . Pt needs a referral for back pain

## 2021-05-28 NOTE — Telephone Encounter (Signed)
Referral has been placed. 

## 2021-05-28 NOTE — Telephone Encounter (Signed)
Spoke with the patient. She is aware her referral has been placed.

## 2021-06-05 NOTE — Telephone Encounter (Signed)
Patient says that she was told to call back if she has not heard anything from [facility]. She is wanting to let Ria Comment know she hasn't heard back.  Patient can be reached at [phone].  Please advise.

## 2021-06-06 ENCOUNTER — Telehealth: Payer: Self-pay | Admitting: Family Medicine

## 2021-06-06 ENCOUNTER — Other Ambulatory Visit: Payer: Self-pay | Admitting: Cardiology

## 2021-06-06 ENCOUNTER — Other Ambulatory Visit: Payer: Self-pay | Admitting: Family Medicine

## 2021-06-06 ENCOUNTER — Encounter: Payer: Self-pay | Admitting: Physical Medicine and Rehabilitation

## 2021-06-06 NOTE — Telephone Encounter (Signed)
Left a message for the pt to return my call.  

## 2021-06-06 NOTE — Telephone Encounter (Signed)
Prescription refill request for Eliquis received. Indication:Afib Last office visit:8/22 Scr:0.9 Age: 87 Weight:59.7 kg  Prescription refilled

## 2021-06-06 NOTE — Telephone Encounter (Signed)
Pt is returning call.  

## 2021-06-06 NOTE — Telephone Encounter (Signed)
Pt informed of current status of referral and verbalized understanding.

## 2021-06-06 NOTE — Telephone Encounter (Signed)
Pt call and stated she is returning [place] call and want a call back.

## 2021-06-06 NOTE — Telephone Encounter (Signed)
Please see previous note.

## 2021-06-09 ENCOUNTER — Telehealth: Payer: Self-pay | Admitting: Pharmacist

## 2021-06-09 NOTE — Chronic Care Management (AMB) (Addendum)
Chronic Care Management Pharmacy Assistant   Name: MAKINLEY MUSCATO  MRN: [id number] DOB: [birth year]  Reason for Encounter: Medication Review Medication Coordination   Recent office visits:  None  Recent consult visits:  None   Hospital visits:  None in previous 6 months  Medications: Outpatient Encounter Medications as of 06/09/2021  Medication Sig   acetaminophen (TYLENOL) 500 MG tablet Take 1,000 mg by mouth every 6 (six) hours as needed for mild pain or moderate pain.    CALCIUM PO Take 1 tablet by mouth daily.   Cholecalciferol (VITAMIN D3) 125 MCG (5000 UT) CAPS TAKE ONE CAPSULE BY MOUTH EVERY MORNING   ELIQUIS 5 MG TABS tablet TAKE ONE TABLET BY MOUTH EVERY MORNING and TAKE ONE TABLET BY MOUTH EVERY EVENING   glucose blood (ONE TOUCH ULTRA TEST) test strip Check once daily. E11.40   insulin degludec (TRESIBA FLEXTOUCH) 100 UNIT/ML FlexTouch Pen Inject 24 units into THE SKIN ONCE daily AT 10pm   Insulin Pen Needle 29G X 5MM MISC Use once daily   levothyroxine (SYNTHROID) 50 MCG tablet Take 1 tablet (50 mcg total) by mouth daily.   loratadine (CLARITIN) 10 MG tablet Take 10 mg by mouth daily as needed for allergies.   metFORMIN (GLUCOPHAGE-XR) 500 MG 24 hr tablet Take 1 tablet (500 mg total) by mouth 2 (two) times daily. (Patient taking differently: Take 500 mg by mouth daily.)   metoprolol succinate (TOPROL-XL) 25 MG 24 hr tablet Take 1 tablet (25 mg total) by mouth daily.   omeprazole (PRILOSEC) 20 MG capsule Take 1 capsule (20 mg total) by mouth 2 (two) times daily.   ONE TOUCH ULTRA TEST test strip CHECK ONCE A DAY   potassium chloride (KLOR-CON) 10 MEQ tablet TAKE ONE TABLET BY MOUTH EVERY MORNING   torsemide (DEMADEX) 10 MG tablet TAKE ONE TABLET BY MOUTH WITH A MEAL   traZODone (DESYREL) 50 MG tablet TAKE 1/2 TABLET - ONE tab BY MOUTH AT bedtime AS NEEDED FOR SLEEP   vitamin B-12 (CYANOCOBALAMIN) 1000 MCG tablet Take 1 tablet (1,000 mcg total) by mouth daily.   No  facility-administered encounter medications on file as of 06/09/2021.  Reviewed chart for medication changes ahead of medication coordination call.  No OVs, Consults, or hospital visits since last care coordination call/Pharmacist visit.  No medication changes indicated.  BP Readings from Last 3 Encounters:  05/07/21 120/60  02/24/21 112/60  01/21/21 (!) 120/58    Lab Results  Component Value Date   HGBA1C 10.5 (H) 01/21/2021  Recent Relevant Labs: Lab Results  Component Value Date/Time   HGBA1C 10.5 (H) 01/21/2021 03:43 PM   HGBA1C 7.8 (A) 07/29/2020 01:37 PM   HGBA1C 9.1 (H) 04/22/2020 04:17 PM   MICROALBUR 0.9 04/07/2013 07:58 AM    Kidney Function Lab Results  Component Value Date/Time   CREATININE 0.93 01/21/2021 03:43 PM   CREATININE 0.81 06/13/2020 02:15 PM   CREATININE 0.81 04/22/2020 04:17 PM   CREATININE 0.69 07/30/2016 08:08 AM   CREATININE 0.8 04/03/2014 10:26 AM   CREATININE 0.8 03/20/2013 09:10 AM   GFR 54.77 (L) 01/21/2021 03:43 PM   GFRNONAA 65 06/13/2020 02:15 PM   GFRAA 75 06/13/2020 02:15 PM    Current antihyperglycemic regimen:  Metformin ER 500mg , 1 tablet twice daily - taking once a day Tresiba Flextouch, inject 22 units once daily at 10 PM  What recent interventions/DTPs have been made to improve glycemic control:  Patient reports none Have there been any recent hospitalizations  or ED visits since last visit with CPP? No Patient reports hypoglycemic symptoms, including None Patient reports hyperglycemic symptoms, including weakness How often are you checking your blood sugar? twice daily What are your blood sugars ranging?  Patient reports for the past 5 days her sugars in the mornings have been in the low 80's she reports she as instructed dropped down her insulin dose 2 points and just rested until it came back up on those days. She reports it was 115 this morning and she was feeling a little better. During the week, how often does your blood  glucose drop below 70?  Rarely below 80 which is still low for her.    Adherence Review: Is the patient currently on a STATIN medication? No Is the patient currently on ACE/ARB medication? No Does the patient have >5 day gap between last estimated fill dates? No    Patient obtains medications through Adherence Packaging  30 Days   Last adherence delivery included: delivery to include: Tresiba - takes 24 units before breakfast Potassium ER 10MEQ - daily with breakfast Metoprolol ER 25mg  - daily with breakfast Trazodone 50mg  - 1/2-1 tablet at bedtime as needed Levothyroxine 67mcg - take 1 tablet before breakfast. Omeprazole 20mg  -  1 tablet daily before breakfast and 1 tablet before evening meal  Eliquis 5mg  - take 1 tablet with breakfast and 1 tablet with dinner   Patient declined the following medications due to taking torsemide as needed. She still has some Metformin where it was changed to once daily on the metformin due to GI upset. Patient is getting her vitamins for free now.    Metformin 500mg  - 1 tablet with breakfast and 1 tablet with evening meal Torsemide 10mg  - daily with breakfast Vitamin B-12 1000mg  - daily with evening meal Calcium - vitamin D3 600 - daily with evening meal Vitamin D3 5000 units - daily with breakfast    This delivery to include: Trazodone 50mg  - 1/2-1 tablet at bedtime as needed Potassium ER 10MEQ - daily with breakfast Torsemide 10mg  - daily with breakfast Omeprazole 20mg  -  1 tablet daily before breakfast and 1 tablet before evening meal Levothyroxine 73mcg - take 1 tablet before breakfast. Metoprolol ER 25mg  - daily with breakfast  Eliquis 5mg  - take 1 tablet with breakfast and 1 tablet with dinner ** Patient counted her metformin and has 40 pills would like to be asked about a fill next month**  Confirmed delivery date of 06/19/21, advised patient that pharmacy will contact them the morning of delivery.   Care Gaps: Zoster Vaccine -  Overdue Urine Micro- Overdue Foot Exam - Overdue PNA Vaccine - Overdue COVID Vaccine - Overdue CCM - Need AWV- 3/23 Lab Results  Component Value Date   HGBA1C 10.5 (H) 01/21/2021    Star Rating Drugs: Metformin 500 mg - Last filled 11/21/20 90 DS at [facility] ( Stomach Upset Pt still has on hand supply per notes since change)   Patient Assistance: Eliquis -  Denied in 2022 - 06/11/21 offered free trial card for eliquis per Jeni Salles patient accepted  [facility] Clinical Pharmacist Assistant [phone]

## 2021-06-11 ENCOUNTER — Telehealth: Payer: Self-pay

## 2021-06-11 ENCOUNTER — Encounter: Payer: Self-pay | Admitting: Cardiology

## 2021-06-11 ENCOUNTER — Telehealth: Payer: Self-pay | Admitting: Cardiology

## 2021-06-11 NOTE — Telephone Encounter (Signed)
  Pt is calling to f/u her mychart message sent to day, she also wanted to add her feet is swelling as well

## 2021-06-11 NOTE — Telephone Encounter (Signed)
Spoke with patient that Dr. Martinique wants to see her in the office. Made appointment for 06/13/21 at 8 am.

## 2021-06-11 NOTE — Telephone Encounter (Signed)
Spoke with patient regarding her concerns of "heart out of rhythm and shortness of breath."  Also reports both lower extremities from the thighs down to feet are swollen. As she spoke, I could tell she was sob. States she gained five pounds over past week. She took v/s; bp 135/83, p 84, O2 sat 99%. Please advise.

## 2021-06-11 NOTE — Telephone Encounter (Signed)
Called patient but she has already spoken with Inocencio Homes and has an appt on 06/13/21 with Martinique MD

## 2021-06-11 NOTE — Telephone Encounter (Signed)
She will need to be seen. Certainly could be back in Afib. Can see me or APP  Adar Rase Martinique MD, [facility]

## 2021-06-12 NOTE — Progress Notes (Signed)
Cardiology Office Note   Date:  06/13/2021   ID:  Julie Norton, DOB 12-05-1929, MRN [id number]  PCP:  Eulas Post, MD  Cardiologist: Glorianna Gott Martinique MD  Chief Complaint  Patient presents with   Atrial Fibrillation        History of Present Illness: Julie Norton is a 87 y.o. female who presents for follow up of Atrial fibrillation.   She has a past history of hypercholesterolemia. She also has a history of DM on  insulin. Echo in 2015 showed mild MR with normal LV function. She had a normal Myoview study in November 2017.   She was seen by Dr. Elease Hashimoto on 09/20/17. Found to be in Afib with rate 95. Started on Eliquis. Follow up Echo showed mild biatrial enlargement and normal LV function. She  noted some fatigue, decreased energy and leg swelling. Some dyspnea and chest tightness. Was started on lasix with improvement of these symptoms. She subsequently underwent TEE guided DCCV on 10/28/17 with return to NSR.   Was seen in early September 2020 with  increased leg swelling for 2 months. Noted SOB going up stairs. Was found to be in Afib with rate 105. Toprol XL was added for rate control. Lasix was increased.  We did try her on Flecainide but this resulted in severe nausea and diarrhea and was discontinued.   She was seen in the ED on 03/27/19 with complaints of epigastric pain/abdominal pain. Mildly elevated lipase. Other labs OK. Ecg in Afib with rate 80. CXR and CT abdomen were normal.  She subsequently underwent successful DCCV on 04/06/19. Seen back on October 27 and was in NSR. Dr Burchette's note of Dec 18 thought she was back in Afib but no Ecg done. Rate controlled. When seen in our office she was in Afib.  She was started on amiodarone  at 400 mg daily. She underwent repeat DCCV on March 5 successfully. Due to some nausea amiodarone was reduced to 200 mg daily.    She did develop side effects on amiodarone and this was discontinued in December 2021. Had symptoms  despite reduction in dose to 100 mg daily. She reports feeling much better afterwards.  Repeat Echo showed normal LV function. There was increased RA size and TR. BNP was 180. UA negative for proteinuria.   Her husband just passed away this year. When seen in August she was doing well and was still in NSR with long first degree AV block. Rate 75.   She reports over the last 2 days she has felt out of rhythm. She notes significant increase in weight, swelling, and SOB. Some chest tightness. Has been compliant with medication but notes she missed one dose of Eliquis on Dec 1 which was very unusual for her.      Past Medical History:  Diagnosis Date   Abdominal adhesions    Abdominal gas pain    suspected -- may be related to intermittent right upper quadrant discomfort radiating around her back       Anemia    Anginal pain (HCC)    Arthritis    Atrial fibrillation (HCC)    Breast cancer (HCC)    Breast mass    left breast   Cancer ([place])    left breast   Chest pain    intermittent right upper quadrant discomfort radiating around her back       Complication of anesthesia    Diabetes mellitus without complication ([place])  Type II   Dyspepsia    Dyspnea    GERD (gastroesophageal reflux disease)    H/O: hysterectomy 1978   History of cardioversion    Hypercholesteremia    Hypothyroidism    Mitral insufficiency    Personal history of radiation therapy    Pneumonia    PONV (postoperative nausea and vomiting)    Right bundle branch block (RBBB)    Tendinitis    of the right arm and shoulder   Torn tendon    right shoulder    Past Surgical History:  Procedure Laterality Date   ABDOMINAL HYSTERECTOMY     APPENDECTOMY  1978   BLADDER REPAIR     BREAST LUMPECTOMY Left 02/24/2011   central partial mastectomy - dr Margot Chimes   BREAST REDUCTION SURGERY     CARDIOVERSION N/A 10/28/2017   Procedure: CARDIOVERSION;  Surgeon: Lelon Perla, MD;  Location: [place];  Service:  Cardiovascular;  Laterality: N/A;   CARDIOVERSION N/A 04/06/2019   Procedure: CARDIOVERSION;  Surgeon: [name], MD;  Location: [place];  Service: Cardiovascular;  Laterality: N/A;   CARDIOVERSION N/A 09/08/2019   Procedure: CARDIOVERSION;  Surgeon: Dorothy Spark, MD;  Location: [place];  Service: Cardiovascular;  Laterality: N/A;   COLONOSCOPY WITH PROPOFOL N/A 03/24/2017   Procedure: COLONOSCOPY WITH PROPOFOL;  Surgeon: Wilford Corner, MD;  Location: [place];  Service: Endoscopy;  Laterality: N/A;   ESOPHAGOGASTRODUODENOSCOPY (EGD) WITH PROPOFOL N/A 03/24/2017   Procedure: ESOPHAGOGASTRODUODENOSCOPY (EGD) WITH PROPOFOL;  Surgeon: Wilford Corner, MD;  Location: [place];  Service: Endoscopy;  Laterality: N/A;   LIPOMA EXCISION Right    REDUCTION MAMMAPLASTY Bilateral 1998   RETINAL DETACHMENT SURGERY Left    SALPINGOOPHORECTOMY     TEE WITHOUT CARDIOVERSION N/A 10/28/2017   Procedure: TRANSESOPHAGEAL ECHOCARDIOGRAM (TEE);  Surgeon: Lelon Perla, MD;  Location: [facility] ENDOSCOPY;  Service: Cardiovascular;  Laterality: N/A;   [place]   at 87 years of age     Current Outpatient Medications  Medication Sig Dispense Refill   acetaminophen (TYLENOL) 500 MG tablet Take 1,000 mg by mouth every 6 (six) hours as needed for mild pain or moderate pain.      CALCIUM PO Take 1 tablet by mouth daily.     Cholecalciferol (VITAMIN D3) 125 MCG (5000 UT) CAPS TAKE ONE CAPSULE BY MOUTH EVERY MORNING 90 capsule 0   ELIQUIS 5 MG TABS tablet TAKE ONE TABLET BY MOUTH EVERY MORNING and TAKE ONE TABLET BY MOUTH EVERY EVENING 180 tablet 1   glucose blood (ONE TOUCH ULTRA TEST) test strip Check once daily. E11.40 100 each 11   insulin degludec (TRESIBA FLEXTOUCH) 100 UNIT/ML FlexTouch Pen Inject 24 units into THE SKIN ONCE daily AT 10pm (Patient taking differently: Inject 14 units into THE SKIN ONCE daily AT 10pm) 15 mL 3   Insulin Pen Needle 29G X  5MM MISC Use once daily 100 each 3   levothyroxine (SYNTHROID) 50 MCG tablet Take 1 tablet (50 mcg total) by mouth daily. 90 tablet 1   loratadine (CLARITIN) 10 MG tablet Take 10 mg by mouth daily as needed for allergies.     metFORMIN (GLUCOPHAGE-XR) 500 MG 24 hr tablet Take 1 tablet (500 mg total) by mouth 2 (two) times daily. (Patient taking differently: Take 500 mg by mouth daily.) 180 tablet 1   metoprolol succinate (TOPROL-XL) 25 MG 24 hr tablet Take 1 tablet (25 mg total) by mouth daily. 90 tablet 3   omeprazole (PRILOSEC)  20 MG capsule Take 1 capsule (20 mg total) by mouth 2 (two) times daily. 180 capsule 1   ONE TOUCH ULTRA TEST test strip CHECK ONCE A DAY 50 each 3   potassium chloride (KLOR-CON) 10 MEQ tablet TAKE ONE TABLET BY MOUTH EVERY MORNING 90 tablet 0   torsemide (DEMADEX) 20 MG tablet Take 1 tablet (20 mg total) by mouth 2 (two) times daily. 180 tablet 3   traZODone (DESYREL) 50 MG tablet TAKE 1/2 TABLET - ONE tab BY MOUTH AT bedtime AS NEEDED FOR SLEEP 90 tablet 1   vitamin B-12 (CYANOCOBALAMIN) 1000 MCG tablet Take 1 tablet (1,000 mcg total) by mouth daily. 90 tablet 3   No current facility-administered medications for this visit.    Allergies:   Bactrim, Dilaudid [hydromorphone hcl], Hydrocodone, Invokana [canagliflozin], and Macrodantin    Social History:  The patient  reports that she has never smoked. She has never used smokeless tobacco. She reports that she does not drink alcohol and does not use drugs.   Family History:  The patient's family history includes Aortic aneurysm in her mother and sister; Cancer in her mother; Diabetes in her mother; Heart attack in her mother; Hypertension in her mother; Pneumonia in her father; Stroke in her mother.    ROS:  Please see the history of present illness.   Otherwise, review of systems are positive for none.   All other systems are reviewed and negative.    PHYSICAL EXAM: VS:  BP 130/66 (BP Location: Left Arm)   Pulse  65   Ht 5\' 1"  (1.549 m)   Wt 141 lb 6.4 oz (64.1 kg)   SpO2 99%   BMI 26.72 kg/m  , BMI Body mass index is 26.72 kg/m. GENERAL:  Well appearing elderly WF  In NAD HEENT:  PERRL, EOMI, sclera are clear. Oropharynx is clear. NECK:  + jugular venous distention, carotid upstroke brisk and symmetric, no bruits, no thyromegaly or adenopathy LUNGS:  Bibasilar rales.  CHEST:  Unremarkable HEART:  IRRR,  PMI not displaced or sustained,S1 and S2 within normal limits, no S3, no S4: no clicks, no rubs, there is a soft 1-2/6 SEM ABD:  Soft, nontender. BS +, no masses or bruits. No hepatomegaly, no splenomegaly EXT:  2 + pulses throughout, 2+  edema, no cyanosis no clubbing SKIN:  Warm and dry.  No rashes NEURO:  Alert and oriented x 3. Cranial nerves II through XII intact. PSYCH:  Cognitively intact  EKG:  EKG is  ordered today. Afib with rate 67. RBBB. QTc 478 msec. I have personally reviewed and interpreted this study.    Recent Labs: 06/13/2020: BNP 179.9 01/21/2021: BUN 17; Creatinine, Ser 0.93; Hemoglobin 10.1; Platelets 147.0; Potassium 4.0; Sodium 139; TSH 0.96    Lipid Panel    Component Value Date/Time   CHOL 94 06/23/2019 0825   CHOL 135 04/26/2017 0840   TRIG 70.0 06/23/2019 0825   HDL 35.20 (L) 06/23/2019 0825   HDL 45 04/26/2017 0840   CHOLHDL 3 06/23/2019 0825   VLDL 14.0 06/23/2019 0825   LDLCALC 45 06/23/2019 0825   LDLCALC 70 04/26/2017 0840      Wt Readings from Last 3 Encounters:  06/13/21 141 lb 6.4 oz (64.1 kg)  05/07/21 131 lb 11.2 oz (59.7 kg)  02/24/21 134 lb 3.2 oz (60.9 kg)      Lexiscan myoview 05/26/16:Study Highlights   Nuclear stress EF: 72%. There was no ST segment deviation noted during stress. This is a  low risk study. The left ventricular ejection fraction is hyperdynamic (>65%).   1. Small, mild mid-anteroseptal perfusion defect.  This defect was actually more intense at rest than with stress.  No ischemia.  Suspect attenuation.  2. EF  72% with normal wall motion.  3. Overall low risk study.      Echo 09/24/17: Study Conclusions   - Left ventricle: The cavity size was normal. Systolic function was   normal. The estimated ejection fraction was in the range of 55%   to 60%. Wall motion was normal; there were no regional wall   motion abnormalities. - Mitral valve: There was mild regurgitation. - Left atrium: The atrium was mildly dilated. - Right atrium: The atrium was mildly dilated. - Atrial septum: No defect or patent foramen ovale was identified. - Pulmonary arteries: Systolic pressure was mildly increased. PA   peak pressure: 37 mm Hg (S).    Echo 06/24/20: IMPRESSIONS     1. Left ventricular ejection fraction, by estimation, is 60 to 65%. The  left ventricle has normal function. The left ventricle has no regional  wall motion abnormalities. Left ventricular diastolic parameters are  consistent with Grade II diastolic  dysfunction (pseudonormalization).   2. Right ventricular systolic function is mildly reduced. The right  ventricular size is mildly enlarged. There is normal pulmonary artery  systolic pressure.   3. Right atrial size was severely dilated.   4. The mitral valve is normal in structure. Trivial mitral valve  regurgitation.   5. Tricuspid valve regurgitation is moderate.   6. The aortic valve is tricuspid. There is mild thickening of the aortic  valve. Aortic valve regurgitation is not visualized. No aortic stenosis is  present.   7. Aortic dilatation noted. There is mild dilatation of the aortic root,  measuring 36 mm. There is mild dilatation of the ascending aorta,  measuring 37 mm.   8. The inferior vena cava is dilated in size with <50% respiratory  variability, suggesting right atrial pressure of 15 mmHg.   Comparison(s): Compared to prior study in 2019, there is now moderate TR.  Otherwise there is no significant change   ASSESSMENT AND PLAN:  1. Atrial fibrillation. Recurrent.  Mali vasc score of 4. On Eliquis. Rate controlled on Toprol XL.  S/p TEE guided DCCV in April 2019. Repeat DCCV on April 06, 2019 with ERAD. She was intolerant of Flecainide. She was symptomatic with her Afib so was started on amiodarone. S/p  successful repeat DCCV on September 08, 2019.  Amiodarone was discontinued in December 2021 due to side effects. She now has recurrent Afib. Rate controlled on low dose Toprol. Will refer to Afib clinic for consideration of Tikosyn load followed by DCCV.  2.  Chronic diastolic CHF. She is significantly volume overloaded today. Weight up 10 lbs. Has edema and rales. Will increase Torsemide to 40 mg bid for 3 days then 20 mg bid. Check labs today.  Encourage sodium restriction. Echo 2021 was unremarkable except for moderate TR.   3. Hypercholesterolemia. On low dose lipitor.   4. Diabetes mellitus- now on insulin.    5. History of Breast CA.   6. RBBB and first degree AV block with long PR in past.   Follow up in Afib clinic.   Signed, Rosabell Geyer Martinique MD, [facility]   06/13/2021 8:36 AM    [place]

## 2021-06-12 NOTE — Telephone Encounter (Signed)
Another nurse spoke with this pt 12/7 and made an appointment. See chart.

## 2021-06-13 ENCOUNTER — Ambulatory Visit (INDEPENDENT_AMBULATORY_CARE_PROVIDER_SITE_OTHER): Payer: Medicare Other | Admitting: Cardiology

## 2021-06-13 ENCOUNTER — Ambulatory Visit: Payer: Medicare Other | Admitting: Cardiology

## 2021-06-13 ENCOUNTER — Other Ambulatory Visit: Payer: Self-pay

## 2021-06-13 ENCOUNTER — Encounter: Payer: Self-pay | Admitting: Cardiology

## 2021-06-13 VITALS — BP 130/66 | HR 65 | Ht 61.0 in | Wt 141.4 lb

## 2021-06-13 DIAGNOSIS — I5032 Chronic diastolic (congestive) heart failure: Secondary | ICD-10-CM | POA: Diagnosis not present

## 2021-06-13 DIAGNOSIS — R0602 Shortness of breath: Secondary | ICD-10-CM | POA: Diagnosis not present

## 2021-06-13 DIAGNOSIS — I480 Paroxysmal atrial fibrillation: Secondary | ICD-10-CM | POA: Diagnosis not present

## 2021-06-13 DIAGNOSIS — E039 Hypothyroidism, unspecified: Secondary | ICD-10-CM

## 2021-06-13 DIAGNOSIS — I5033 Acute on chronic diastolic (congestive) heart failure: Secondary | ICD-10-CM | POA: Diagnosis not present

## 2021-06-13 MED ORDER — TORSEMIDE 20 MG PO TABS: 20.0000 mg | ORAL_TABLET | Freq: Two times a day (BID) | ORAL | 3 refills | Status: DC

## 2021-06-13 NOTE — Patient Instructions (Signed)
Increase torsemide to 40 mg twice a day for 3 days then 20 mg twice a day  Continue your other therapy  We will arrange follow up in our Afib clinic for consideration of other therapy for your Afib.

## 2021-06-14 LAB — BRAIN NATRIURETIC PEPTIDE: BNP: 185.5 pg/mL — ABNORMAL HIGH (ref 0.0–100.0)

## 2021-06-14 LAB — CBC WITH DIFFERENTIAL/PLATELET
Basophils Absolute: 0 10*3/uL (ref 0.0–0.2)
Basos: 1 %
EOS (ABSOLUTE): 0 10*3/uL (ref 0.0–0.4)
Eos: 1 %
Hematocrit: 28.8 % — ABNORMAL LOW (ref 34.0–46.6)
Hemoglobin: 8.4 g/dL — ABNORMAL LOW (ref 11.1–15.9)
Immature Grans (Abs): 0 10*3/uL (ref 0.0–0.1)
Immature Granulocytes: 1 %
Lymphocytes Absolute: 0.9 10*3/uL (ref 0.7–3.1)
Lymphs: 22 %
MCH: 20.8 pg — ABNORMAL LOW (ref 26.6–33.0)
MCHC: 29.2 g/dL — ABNORMAL LOW (ref 31.5–35.7)
MCV: 71 fL — ABNORMAL LOW (ref 79–97)
Monocytes Absolute: 0.4 10*3/uL (ref 0.1–0.9)
Monocytes: 9 %
Neutrophils Absolute: 2.8 10*3/uL (ref 1.4–7.0)
Neutrophils: 66 %
Platelets: 172 10*3/uL (ref 150–450)
RBC: 4.04 x10E6/uL (ref 3.77–5.28)
RDW: 14.4 % (ref 11.7–15.4)
WBC: 4.1 10*3/uL (ref 3.4–10.8)

## 2021-06-14 LAB — TSH: TSH: 2.66 u[IU]/mL (ref 0.450–4.500)

## 2021-06-14 LAB — BASIC METABOLIC PANEL
BUN/Creatinine Ratio: 14 (ref 12–28)
BUN: 13 mg/dL (ref 8–27)
CO2: 25 mmol/L (ref 20–29)
Calcium: 9.2 mg/dL (ref 8.7–10.3)
Chloride: 104 mmol/L (ref 96–106)
Creatinine, Ser: 0.96 mg/dL (ref 0.57–1.00)
Glucose: 125 mg/dL — ABNORMAL HIGH (ref 70–99)
Potassium: 4.1 mmol/L (ref 3.5–5.2)
Sodium: 141 mmol/L (ref 134–144)
eGFR: 57 mL/min/{1.73_m2} — ABNORMAL LOW (ref >59)

## 2021-06-14 LAB — MAGNESIUM: Magnesium: 1.9 mg/dL (ref 1.6–2.3)

## 2021-06-16 ENCOUNTER — Other Ambulatory Visit: Payer: Self-pay

## 2021-06-16 MED ORDER — FERROUS SULFATE 325 (65 FE) MG PO TBEC: DELAYED_RELEASE_TABLET | ORAL | 3 refills | Status: AC

## 2021-06-16 NOTE — Progress Notes (Signed)
Ferrous

## 2021-06-19 ENCOUNTER — Other Ambulatory Visit: Payer: Self-pay

## 2021-06-19 ENCOUNTER — Ambulatory Visit (HOSPITAL_COMMUNITY)
Admission: RE | Admit: 2021-06-19 | Discharge: 2021-06-19 | Disposition: A | Discharge status: Home / Self Care | Payer: Medicare Other | Source: Ambulatory Visit | Attending: Nurse Practitioner | Admitting: Nurse Practitioner

## 2021-06-19 VITALS — BP 112/56 | HR 76 | Ht 61.0 in | Wt 134.4 lb

## 2021-06-19 DIAGNOSIS — I4510 Unspecified right bundle-branch block: Secondary | ICD-10-CM | POA: Diagnosis not present

## 2021-06-19 DIAGNOSIS — Z79899 Other long term (current) drug therapy: Secondary | ICD-10-CM | POA: Diagnosis not present

## 2021-06-19 DIAGNOSIS — D6869 Other thrombophilia: Secondary | ICD-10-CM

## 2021-06-19 DIAGNOSIS — Z7901 Long term (current) use of anticoagulants: Secondary | ICD-10-CM | POA: Insufficient documentation

## 2021-06-19 DIAGNOSIS — E1142 Type 2 diabetes mellitus with diabetic polyneuropathy: Secondary | ICD-10-CM | POA: Insufficient documentation

## 2021-06-19 DIAGNOSIS — Z634 Disappearance and death of family member: Secondary | ICD-10-CM | POA: Diagnosis not present

## 2021-06-19 DIAGNOSIS — I4819 Other persistent atrial fibrillation: Secondary | ICD-10-CM | POA: Insufficient documentation

## 2021-06-19 MED ORDER — TRAZODONE HCL 50 MG PO TABS: 50.0000 mg | ORAL_TABLET | Freq: Every day | ORAL | Status: DC

## 2021-06-19 MED ORDER — METFORMIN HCL ER 500 MG PO TB24
500.0000 mg | ORAL_TABLET | Freq: Every day | ORAL | Status: DC
Start: 2021-06-19 — End: 2021-07-15

## 2021-06-19 NOTE — Progress Notes (Addendum)
Primary Care Physician: Eulas Post, MD Referring Physician:Dr. Martinique    Irlene V Gongaware is a 87 y.o. female with a h/o afib, RBBB, DM, peripheral neuropathy, that is in the afib clinic for options for  restoring SR.   She was seen by Dr. Elease Hashimoto on 09/20/17. Found to be in Afib with rate 95. Started on Eliquis. Follow up Echo showed mild biatrial enlargement and normal LV function. She  noted some fatigue, decreased energy and leg swelling. Some dyspnea and chest tightness. Was started on lasix with improvement of these symptoms. She subsequently underwent TEE guided DCCV on 10/28/17 with return to NSR.   Was seen in early September 2020 with  increased leg swelling for 2 months. Noted SOB going up stairs. Was found to be in Afib with rate 105. Toprol XL was added for rate control. Lasix was increased.  We did try her on Flecainide but this resulted in severe nausea and diarrhea and was discontinued.    She subsequently underwent successful DCCV on 04/06/19. Seen back on October 27 and was in NSR. Dr Burchette's note of Dec 18 thought she was back in Afib but no Ecg done. Rate controlled. When seen in our office she was in Afib.  She was started on amiodarone  at 400 mg daily. She underwent repeat DCCV on March 5 successfully. Due to some nausea amiodarone was reduced to 200 mg daily.    She did develop side effects on amiodarone and this was discontinued in December 2021. Had symptoms despite reduction in dose to 100 mg daily. She reports feeling much better afterwards.  Repeat Echo showed normal LV function. There was increased RA size and TR. BNP was 180. UA negative for proteinuria.   Her husband died in 2023/03/03 of this year and this has brought on increased stress. When seen in 03/03/2023, she was in rhythm.   She reported to Dr. Martinique on visit 06/13/21,  over the last 2 days she had felt out of rhythm. She noted significant increase in weight, swelling, and SOB. Some chest tightness.  Has been compliant with medication but notes she missed one dose of Eliquis on Dec 1 which was very unusual for her.    Dr. Martinique wanted her to be evaluated here to see if Tikosyn could be an option. She is here with her daughter today but she does live alone at her home.   Today, she denies symptoms of palpitations, chest pain, shortness of breath, orthopnea, PND, lower extremity edema, dizziness, presyncope, syncope, or neurologic sequela. The patient is tolerating medications without difficulties and is otherwise without complaint today.   Past Medical History:  Diagnosis Date   Abdominal adhesions    Abdominal gas pain    suspected -- may be related to intermittent right upper quadrant discomfort radiating around her back       Anemia    Anginal pain (HCC)    Arthritis    Atrial fibrillation (HCC)    Breast cancer (HCC)    Breast mass    left breast   Cancer ([place])    left breast   Chest pain    intermittent right upper quadrant discomfort radiating around her back       Complication of anesthesia    Diabetes mellitus without complication (HCC)    Type II   Dyspepsia    Dyspnea    GERD (gastroesophageal reflux disease)    H/O: hysterectomy 1978   History of cardioversion  Hypercholesteremia    Hypothyroidism    Mitral insufficiency    Personal history of radiation therapy    Pneumonia    PONV (postoperative nausea and vomiting)    Right bundle branch block (RBBB)    Tendinitis    of the right arm and shoulder   Torn tendon    right shoulder   Past Surgical History:  Procedure Laterality Date   ABDOMINAL HYSTERECTOMY     APPENDECTOMY  1978   BLADDER REPAIR     BREAST LUMPECTOMY Left 02/24/2011   central partial mastectomy - dr Margot Chimes   BREAST REDUCTION SURGERY     CARDIOVERSION N/A 10/28/2017   Procedure: CARDIOVERSION;  Surgeon: Lelon Perla, MD;  Location: [facility] ENDOSCOPY;  Service: Cardiovascular;  Laterality: N/A;   CARDIOVERSION N/A 04/06/2019    Procedure: CARDIOVERSION;  Surgeon: [name], MD;  Location: [facility] ENDOSCOPY;  Service: Cardiovascular;  Laterality: N/A;   CARDIOVERSION N/A 09/08/2019   Procedure: CARDIOVERSION;  Surgeon: Dorothy Spark, MD;  Location: [place];  Service: Cardiovascular;  Laterality: N/A;   COLONOSCOPY WITH PROPOFOL N/A 03/24/2017   Procedure: COLONOSCOPY WITH PROPOFOL;  Surgeon: Wilford Corner, MD;  Location: [facility] ENDOSCOPY;  Service: Endoscopy;  Laterality: N/A;   ESOPHAGOGASTRODUODENOSCOPY (EGD) WITH PROPOFOL N/A 03/24/2017   Procedure: ESOPHAGOGASTRODUODENOSCOPY (EGD) WITH PROPOFOL;  Surgeon: Wilford Corner, MD;  Location: [place];  Service: Endoscopy;  Laterality: N/A;   LIPOMA EXCISION Right    REDUCTION MAMMAPLASTY Bilateral 1998   RETINAL DETACHMENT SURGERY Left    SALPINGOOPHORECTOMY     TEE WITHOUT CARDIOVERSION N/A 10/28/2017   Procedure: TRANSESOPHAGEAL ECHOCARDIOGRAM (TEE);  Surgeon: Lelon Perla, MD;  Location: [facility] ENDOSCOPY;  Service: Cardiovascular;  Laterality: N/A;   [place]   at 87 years of age    Current Outpatient Medications  Medication Sig Dispense Refill   acetaminophen (TYLENOL) 500 MG tablet Take 1,000 mg by mouth every 6 (six) hours as needed for mild pain or moderate pain.      CALCIUM PO Take 1 tablet by mouth daily.     Cholecalciferol (VITAMIN D3) 125 MCG (5000 UT) CAPS TAKE ONE CAPSULE BY MOUTH EVERY MORNING 90 capsule 0   ELIQUIS 5 MG TABS tablet TAKE ONE TABLET BY MOUTH EVERY MORNING and TAKE ONE TABLET BY MOUTH EVERY EVENING 180 tablet 1   ferrous sulfate 325 (65 FE) MG EC tablet Take 325 mg daily  3   glucose blood (ONE TOUCH ULTRA TEST) test strip Check once daily. E11.40 100 each 11   insulin degludec (TRESIBA FLEXTOUCH) 100 UNIT/ML FlexTouch Pen Inject 24 units into THE SKIN ONCE daily AT 10pm (Patient taking differently: Inject 14 units into THE SKIN ONCE daily AT 10pm) 15 mL 3   Insulin Pen Needle 29G X 5MM  MISC Use once daily 100 each 3   levothyroxine (SYNTHROID) 50 MCG tablet Take 1 tablet (50 mcg total) by mouth daily. 90 tablet 1   loratadine (CLARITIN) 10 MG tablet Take 10 mg by mouth daily as needed for allergies.     metoprolol succinate (TOPROL-XL) 25 MG 24 hr tablet Take 1 tablet (25 mg total) by mouth daily. 90 tablet 3   omeprazole (PRILOSEC) 20 MG capsule Take 1 capsule (20 mg total) by mouth 2 (two) times daily. 180 capsule 1   ONE TOUCH ULTRA TEST test strip CHECK ONCE A DAY 50 each 3   potassium chloride (KLOR-CON) 10 MEQ tablet TAKE ONE TABLET BY MOUTH EVERY MORNING 90  tablet 0   torsemide (DEMADEX) 20 MG tablet Take 1 tablet (20 mg total) by mouth 2 (two) times daily. 180 tablet 3   vitamin B-12 (CYANOCOBALAMIN) 1000 MCG tablet Take 1 tablet (1,000 mcg total) by mouth daily. 90 tablet 3   metFORMIN (GLUCOPHAGE-XR) 500 MG 24 hr tablet Take 1 tablet (500 mg total) by mouth daily.     traZODone (DESYREL) 50 MG tablet Take 1 tablet (50 mg total) by mouth at bedtime.     No current facility-administered medications for this encounter.    Allergies  Allergen Reactions   Bactrim Nausea And Vomiting   Dilaudid [Hydromorphone Hcl] Other (See Comments)    Unknown   Hydrocodone Nausea And Vomiting   Invokana [Canagliflozin] Itching and Other (See Comments)    Yeast Infections    Macrodantin Nausea And Vomiting    Social History   Socioeconomic History   Marital status: Widowed    Spouse name: Not on file   Number of children: Not on file   Years of education: Not on file   Highest education level: Not on file  Occupational History   Not on file  Tobacco Use   Smoking status: Never   Smokeless tobacco: Never  Vaping Use   Vaping Use: Never used  Substance and Sexual Activity   Alcohol use: No   Drug use: No   Sexual activity: Not on file  Other Topics Concern   Not on file  Social History Narrative   Not on file   Social Determinants of Health   Financial  Resource Strain: Low Risk    Difficulty of Paying Living Expenses: Not hard at all  Food Insecurity: No Food Insecurity   Worried About Running Out of Food in the Last Year: Never true   [place] in the Last Year: Never true  Transportation Needs: No Transportation Needs   Lack of Transportation (Medical): No   Lack of Transportation (Non-Medical): No  Physical Activity: Inactive   Days of Exercise per Week: 0 days   Minutes of Exercise per Session: 0 min  Stress: No Stress Concern Present   Feeling of Stress : Only a little  Social Connections: Moderately Isolated   Frequency of Communication with Friends and Family: More than three times a week   Frequency of Social Gatherings with Friends and Family: Once a week   Attends Religious Services: Never   Marine scientist or Organizations: No   Attends Music therapist: Never   Marital Status: Married  Human resources officer Violence: Not At Risk   Fear of Current or Ex-Partner: No   Emotionally Abused: No   Physically Abused: No   Sexually Abused: No    Family History  Problem Relation Age of Onset   Pneumonia Father    Heart attack Mother    Hypertension Mother    Stroke Mother    Diabetes Mother    Aortic aneurysm Mother        abdominal aortic aneurysm   Cancer Mother        bladder   Aortic aneurysm Sister        abdominal aortic aneurysm    ROS- All systems are reviewed and negative except as per the HPI above  Physical Exam: Vitals:   06/19/21 1326  BP: (!) 112/56  Pulse: 76  Weight: 61 kg  Height: 5\' 1"  (1.549 m)   Wt Readings from Last 3 Encounters:  06/19/21 61 kg  06/13/21  64.1 kg  05/07/21 59.7 kg    Labs: Lab Results  Component Value Date   NA 141 06/13/2021   K 4.1 06/13/2021   CL 104 06/13/2021   CO2 25 06/13/2021   GLUCOSE 125 (H) 06/13/2021   BUN 13 06/13/2021   CREATININE 0.96 06/13/2021   CALCIUM 9.2 06/13/2021   MG 1.9 06/13/2021   Lab Results  Component  Value Date   INR 1.2 10/12/2017   Lab Results  Component Value Date   CHOL 94 06/23/2019   HDL 35.20 (L) 06/23/2019   LDLCALC 45 06/23/2019   TRIG 70.0 06/23/2019     GEN- The patient is well appearing, alert and oriented x 3 today.   Head- normocephalic, atraumatic Eyes-  Sclera clear, conjunctiva pink Ears- hearing intact Oropharynx- clear Neck- supple, no JVP Lymph- no cervical lymphadenopathy Lungs- Clear to ausculation bilaterally, normal work of breathing Heart- irregular rate and rhythm, no murmurs, rubs or gallops, PMI not laterally displaced GI- soft, NT, ND, + BS Extremities- no clubbing, cyanosis, or edema MS- no significant deformity or atrophy Skin- no rash or lesion Psych- euthymic mood, full affect Neuro- strength and sensation are intact  EKG-afib at 76 bpm, qrs int 140 ms, qtc 506 ms( RBBB contributing )  In SR in March and August  qtc was 503 and 461 ms   left ventricle has normal function. The left ventricle has no regional  wall motion abnormalities. Left ventricular diastolic parameters are  consistent with Grade II diastolic  dysfunction (pseudonormalization).   2. Right ventricular systolic function is mildly reduced. The right  ventricular size is mildly enlarged. There is normal pulmonary artery  systolic pressure.   3. Right atrial size was severely dilated.   4. The mitral valve is normal in structure. Trivial mitral valve  regurgitation.   5. Tricuspid valve regurgitation is moderate.   6. The aortic valve is tricuspid. There is mild thickening of the aortic  valve. Aortic valve regurgitation is not visualized. No aortic stenosis is  present.   7. Aortic dilatation noted. There is mild dilatation of the aortic root,  measuring 36 mm. There is mild dilatation of the ascending aorta,  measuring 37 mm.   8. The inferior vena cava is dilated in size with <50% respiratory  variability, suggesting right atrial pressure of 15 mmHg.   Assessment  and Plan:  1. Persistent symptomatic rate controlled afib  Pt has failed flecainide and amiodarone  Tikosyn is her only option although not ideal, as her crcl cal is only 37.62 ml/min so it means that she would qualify for the lowest dose of tikosyn, 125 mcg, so no wiggle  room if dose has to be reduced Also her qtc is questionable, 506 ms, today, in afib and running 460 to 503 in SR, usually like a qt around 440 ms to start tikosyn, RBBB is contributing so that has to be taken into consideration  She also has RT sided failure which also may be contributing to her LLE and her shortness of breath, so unclear how much her symptoms will improve with return of SR  Continue metoprolol and torsemide at current doses, weight is stable   2. CHA2DS2VASc  score of 4 Reminded not to miss any more of her eliquis 5 mg bid with possible tikosyn admit ( missed dose 06/05/21)   I will discuss with Dr. Rayann Heman if admission for tikosyn is feasible  with current crcl and prolonged qt , and let the pt  know. If admission is okayed,if so,  will probably be after the first of the year until it can be scheduled.  Addendum- 12/21- discussed with EP, willing to admit pt  if she  understands that drug may not be successful with renal function limiting dose and qt long at baseline, pt will discuss with daughter and get back with [place].   Geroge Baseman Tillmon Kisling, [facility] [address] [place], [place] [address] [phone]

## 2021-06-23 ENCOUNTER — Telehealth: Payer: Medicare Other | Admitting: Pharmacist

## 2021-06-23 NOTE — Telephone Encounter (Signed)
Medication list reviewed in anticipation of upcoming Tikosyn initiation. Patient is not taking any contraindicated medication. But patient is taking 1 QTc prolonging medication. Trazodone is a intermediate risk QTc prolonging medication. Patient should consider an alternative sleep aid.  Patient is anticoagulated on Eliquis on the appropriate dose. Please ensure that patient has not missed any anticoagulation doses in the 3 weeks prior to Tikosyn initiation.   Patient will need to be counseled to avoid use of Benadryl while on Tikosyn and in the 2-3 days prior to Tikosyn initiation.  Please ensure that patient K is at least 4 and Mag is at least 2 before starting Tikosyn especially since patient is on torsemide.

## 2021-06-25 NOTE — Addendum Note (Signed)
Encounter addended by: [name], NP on: 06/25/2021 8:44 AM  Actions taken: Clinical Note Signed

## 2021-06-26 ENCOUNTER — Telehealth: Payer: Self-pay | Admitting: Family Medicine

## 2021-06-26 NOTE — Telephone Encounter (Addendum)
Pt is calling to let dr burchette know in jan 2023 she will be going to [place] for 3 day to monitor her heart. Pt states trazodone  interfer with QT . Pt will be followed through A-Fib clinic

## 2021-07-01 NOTE — Telephone Encounter (Signed)
Patient notified of recommendations - she will reach out to PCP for replacement of trazodone and call back with update.

## 2021-07-02 NOTE — Progress Notes (Signed)
07/02/21 Call to patient per Jeni Salles to offer appointment as she missed her last follow up in November, patient reports she has had a lot going in would like to push it out a bit,  phone appointment given in March.  Patient also reports it has been about 4 weeks and she has not heard from PT as of yet, she reports she has not been walking very good and not sure if she should get it still or have them come out to her, she reports she will speak to them about it when they get in touch with her.  She reports she also has not heard about using the trazodone yet, does not tolerate it well, advised her that her PCP has not yet responded and I would reach out to the Pharmacist to see what if any recommendations she has in the mean time.  Patient also reported that she is having rib pain that has gotten worse since she spoke to her provider about it, she reports that she has an upcoming appointment with pain management but that it is not until March of 2023 would be helpful to her if it could be sooner.

## 2021-07-02 NOTE — Telephone Encounter (Signed)
Patient called to follow up. A fib clinic wanted to know if she needs to stop or if you will change to another medication. She has been informed that it will treatment.

## 2021-07-02 NOTE — Telephone Encounter (Signed)
Called patient reviewed all information and repeated back to me. Will call if any questions.  She will d/c medications for now and if any issues she will call office for follow up.

## 2021-07-08 ENCOUNTER — Telehealth: Payer: Self-pay | Admitting: Pharmacist

## 2021-07-08 NOTE — Chronic Care Management (AMB) (Signed)
Chronic Care Management Pharmacy Assistant   Name: Julie Norton  MRN: [id number] DOB: 14-Sep-1929  Reason for Encounter: Medication Review/ Medication Coordination   Conditions to be addressed/monitored: DMII  Recent office visits:  None  Recent consult visits:  06/19/21 [name], NP (Cardiology) - Patient presented for Persistent A-fib and other concerns. Changed Metformin and Changed Trazadone  06/13/21 Martinique, Peter M, MD (Cardiology) - Patient presented for Paroxysmal atrial fibrillation and other concerns. Increased Torsemide 20 mg to twice daily  Hospital visits:  None in previous 6 months  Medications: Outpatient Encounter Medications as of 07/08/2021  Medication Sig   acetaminophen (TYLENOL) 500 MG tablet Take 1,000 mg by mouth every 6 (six) hours as needed for mild pain or moderate pain.    CALCIUM PO Take 1 tablet by mouth daily.   Cholecalciferol (VITAMIN D3) 125 MCG (5000 UT) CAPS TAKE ONE CAPSULE BY MOUTH EVERY MORNING   ELIQUIS 5 MG TABS tablet TAKE ONE TABLET BY MOUTH EVERY MORNING and TAKE ONE TABLET BY MOUTH EVERY EVENING   ferrous sulfate 325 (65 FE) MG EC tablet Take 325 mg daily   glucose blood (ONE TOUCH ULTRA TEST) test strip Check once daily. E11.40   insulin degludec (TRESIBA FLEXTOUCH) 100 UNIT/ML FlexTouch Pen Inject 24 units into THE SKIN ONCE daily AT 10pm (Patient taking differently: Inject 14 units into THE SKIN ONCE daily AT 10pm)   Insulin Pen Needle 29G X 5MM MISC Use once daily   levothyroxine (SYNTHROID) 50 MCG tablet Take 1 tablet (50 mcg total) by mouth daily.   loratadine (CLARITIN) 10 MG tablet Take 10 mg by mouth daily as needed for allergies.   metFORMIN (GLUCOPHAGE-XR) 500 MG 24 hr tablet Take 1 tablet (500 mg total) by mouth daily.   metoprolol succinate (TOPROL-XL) 25 MG 24 hr tablet Take 1 tablet (25 mg total) by mouth daily.   omeprazole (PRILOSEC) 20 MG capsule Take 1 capsule (20 mg total) by mouth 2 (two) times daily.    ONE TOUCH ULTRA TEST test strip CHECK ONCE A DAY   potassium chloride (KLOR-CON) 10 MEQ tablet TAKE ONE TABLET BY MOUTH EVERY MORNING   torsemide (DEMADEX) 20 MG tablet Take 1 tablet (20 mg total) by mouth 2 (two) times daily.   traZODone (DESYREL) 50 MG tablet Take 1 tablet (50 mg total) by mouth at bedtime.   vitamin B-12 (CYANOCOBALAMIN) 1000 MCG tablet Take 1 tablet (1,000 mcg total) by mouth daily.   No facility-administered encounter medications on file as of 07/08/2021.  Recent Relevant Labs: Lab Results  Component Value Date/Time   HGBA1C 10.5 (H) 01/21/2021 03:43 PM   HGBA1C 7.8 (A) 07/29/2020 01:37 PM   HGBA1C 9.1 (H) 04/22/2020 04:17 PM   MICROALBUR 0.9 04/07/2013 07:58 AM    Kidney Function Lab Results  Component Value Date/Time   CREATININE 0.96 06/13/2021 09:03 AM   CREATININE 0.93 01/21/2021 03:43 PM   CREATININE 0.81 04/22/2020 04:17 PM   CREATININE 0.69 07/30/2016 08:08 AM   CREATININE 0.8 04/03/2014 10:26 AM   CREATININE 0.8 03/20/2013 09:10 AM   GFR 54.77 (L) 01/21/2021 03:43 PM   GFRNONAA 65 06/13/2020 02:15 PM   GFRAA 75 06/13/2020 02:15 PM    Current antihyperglycemic regimen:  Metformin ER 500mg , 1 tablet twice daily - taking once a day Tresiba Flextouch, inject 22 units once daily at 10 PM  What recent interventions/DTPs have been made to improve glycemic control:  Patient reports none Have there been any  recent hospitalizations or ED visits since last visit with CPP? No Patient denies hypoglycemic symptoms, including none Patient denies hyperglycemic symptoms, including none How often are you checking your blood sugar? twice daily What are your blood sugars ranging?  Fasting: Patient reports it has been good around 115 but today is her late husbands birthday and she has been having a rough day so was a bit higher this morning. During the week, how often does your blood glucose drop below 70? Never  Adherence Review: Is the patient currently on a  STATIN medication? No Is the patient currently on ACE/ARB medication? No Does the patient have >5 day gap between last estimated fill dates? No   Reviewed chart for medication changes ahead of medication coordination call.  No OVs, Consults, or hospital visits since last care coordination call/Pharmacist visit.   No medication changes indicated  BP Readings from Last 3 Encounters:  06/19/21 (!) 112/56  06/13/21 130/66  05/07/21 120/60    Lab Results  Component Value Date   HGBA1C 10.5 (H) 01/21/2021     Patient obtains medications through Adherence Packaging  30 Days   Last adherence delivery included:  Trazodone 50mg  - 1/2-1 tablet at bedtime as needed Potassium ER 10MEQ - daily with breakfast Torsemide 10mg  - daily with breakfast Omeprazole 20mg  -  1 tablet daily before breakfast and 1 tablet before evening meal Levothyroxine 15mcg - take 1 tablet before breakfast. Metoprolol ER 25mg  - daily with breakfast  Eliquis 5mg  - take 1 tablet with breakfast and 1 tablet with dinner ** Patient counted her metformin and has 40 pills would like to be asked about a fill next month**   Patient is due for next adherence delivery on: 07/18/21. Called patient and reviewed medications and coordinated delivery. Confirmed Packaging for 30 DS  This delivery to include:  Trazodone 50mg  - 1/2-1 tablet at bedtime as needed Potassium ER 10MEQ - daily with breakfast Torsemide 20mg  - daily with breakfast and dinner Omeprazole 20mg  -  1 tablet daily before breakfast and 1 tablet before evening meal Levothyroxine 72mcg - take 1 tablet before breakfast. Metoprolol ER 25mg  - daily with breakfast  Eliquis 5mg  - take 1 tablet with breakfast and 1 tablet with dinner  Added Metformin 500 mg -  to take one Evening Meal  Patient reports she was advised to stop her Trazodone at night until she follows up with Cardiology on the 16 th as she has been in A-fib, she reports they are likely changing it to  something else so will not need.   Confirmed delivery date of 07/18/21, advised patient that pharmacy will contact them the morning of delivery.   Care Gaps: Zoster Vaccine - Overdue Urine Micro- Overdue Foot Exam - Overdue PNA Vaccine - Overdue COVID Vaccine - Overdue CCM - Need AWV- 3/23 Lab Results  Component Value Date   HGBA1C 10.5 (H) 01/21/2021    Star Rating Drugs: Metformin 500 mg - Patient requested fill for 07/18/21 delivery  Patient Assistance: Eliquis -  Denied in 2022 - 06/11/21 offered free trial card for eliquis per Jeni Salles patient accepted  [place] Clinical Pharmacist Assistant [phone]

## 2021-07-09 ENCOUNTER — Encounter (HOSPITAL_COMMUNITY): Payer: Self-pay

## 2021-07-09 ENCOUNTER — Telehealth: Payer: Self-pay | Admitting: Cardiology

## 2021-07-09 DIAGNOSIS — R0781 Pleurodynia: Secondary | ICD-10-CM | POA: Diagnosis not present

## 2021-07-09 NOTE — Addendum Note (Signed)
Addended by: Juluis Mire on: 07/09/2021 01:11 PM   Modules accepted: Orders

## 2021-07-09 NOTE — Telephone Encounter (Signed)
Patient is asking that the nurse give her call. Patient has questions bout scheduling at the AFIB clinc

## 2021-07-09 NOTE — Telephone Encounter (Signed)
Spoke with patient who had concerns of whether or not to be admitted to the hospital and receive treatment as recommended by the afib clinic. She stated her husband always discussed things with her, but he died this past 03/17/23 and she wanted to know what she should do. She was encouraged to do as the afib clinic and Dr. Martinique recommended. She said she would follow through with afib clinic recommendations. She started to sob on the phone over the loss of her husband. Emotional support given.

## 2021-07-09 NOTE — Telephone Encounter (Signed)
Pt states her PCP stopped trazodone and does not want to replace at this time. Pt is stopping trazodone as of today and if pt has difficulty sleeping she will reach back out to her PCP again.

## 2021-07-14 ENCOUNTER — Telehealth: Payer: Self-pay | Admitting: Family Medicine

## 2021-07-14 NOTE — Telephone Encounter (Signed)
Patient called because she is has stopped taking the trazodone for the AFIB clinic. She has not slept in the past three to four nights due to being off the trazodone. Patient is wondering if she can be put on one of the other medications despite the risk as she is not sleeping.     Good callback number is [phone]    Please advise

## 2021-07-14 NOTE — Telephone Encounter (Signed)
Left message for patient to call back  

## 2021-07-15 ENCOUNTER — Other Ambulatory Visit: Payer: Self-pay

## 2021-07-15 MED ORDER — METFORMIN HCL ER 500 MG PO TB24: 500.0000 mg | ORAL_TABLET | Freq: Every day | ORAL | Status: DC

## 2021-07-15 MED ORDER — ZOLPIDEM TARTRATE 5 MG PO TABS: 5.0000 mg | ORAL_TABLET | Freq: Every evening | ORAL | 0 refills | Status: DC | PRN

## 2021-07-15 NOTE — Telephone Encounter (Signed)
Spoke with the patient. She has not tired Ambien before.

## 2021-07-15 NOTE — Telephone Encounter (Signed)
Spoke with the patient, she is aware a Rx has been sent in.

## 2021-07-15 NOTE — Telephone Encounter (Signed)
I sent in a 30-day trial of low-dose Ambien 5 mg nightly

## 2021-07-15 NOTE — Progress Notes (Signed)
Per [facility] call to patient to see if she would like her Ambien sent to her prior to her delivery date of the 14 th she reports she would like to receive it prior, coordinated for 07/16/21 pt aware/pharmacy aware.

## 2021-07-16 ENCOUNTER — Other Ambulatory Visit: Payer: Self-pay

## 2021-07-16 MED ORDER — METFORMIN HCL ER 500 MG PO TB24: 500.0000 mg | ORAL_TABLET | Freq: Every day | ORAL | 1 refills | Status: DC

## 2021-07-18 ENCOUNTER — Other Ambulatory Visit: Payer: Self-pay | Admitting: Cardiology

## 2021-07-21 LAB — SARS CORONAVIRUS 2 (TAT 6-24 HRS): SARS Coronavirus 2: NEGATIVE

## 2021-07-22 ENCOUNTER — Ambulatory Visit (HOSPITAL_COMMUNITY)
Admission: RE | Admit: 2021-07-22 | Discharge: 2021-07-22 | Disposition: A | Discharge status: Home / Self Care | Payer: Medicare Other | Source: Ambulatory Visit | Attending: Nurse Practitioner | Admitting: Nurse Practitioner

## 2021-07-22 ENCOUNTER — Other Ambulatory Visit: Payer: Self-pay

## 2021-07-22 ENCOUNTER — Inpatient Hospital Stay: Admission: AD | Admit: 2021-07-22 | Payer: Medicare Other | Source: Ambulatory Visit | Admitting: Cardiology

## 2021-07-22 VITALS — BP 96/54 | HR 76 | Ht 61.0 in | Wt 128.2 lb

## 2021-07-22 DIAGNOSIS — D6869 Other thrombophilia: Secondary | ICD-10-CM | POA: Diagnosis not present

## 2021-07-22 DIAGNOSIS — I4819 Other persistent atrial fibrillation: Secondary | ICD-10-CM | POA: Diagnosis not present

## 2021-07-22 DIAGNOSIS — E119 Type 2 diabetes mellitus without complications: Secondary | ICD-10-CM | POA: Diagnosis not present

## 2021-07-22 DIAGNOSIS — Z7901 Long term (current) use of anticoagulants: Secondary | ICD-10-CM | POA: Diagnosis not present

## 2021-07-22 DIAGNOSIS — G629 Polyneuropathy, unspecified: Secondary | ICD-10-CM | POA: Insufficient documentation

## 2021-07-22 DIAGNOSIS — I4510 Unspecified right bundle-branch block: Secondary | ICD-10-CM | POA: Diagnosis not present

## 2021-07-22 LAB — BASIC METABOLIC PANEL
Anion gap: 10 (ref 5–15)
BUN: 15 mg/dL (ref 8–23)
CO2: 26 mmol/L (ref 22–32)
Calcium: 9.6 mg/dL (ref 8.9–10.3)
Chloride: 101 mmol/L (ref 98–111)
Creatinine, Ser: 1.04 mg/dL — ABNORMAL HIGH (ref 0.44–1.00)
GFR, Estimated: 51 mL/min — ABNORMAL LOW (ref >60)
Glucose, Bld: 366 mg/dL — ABNORMAL HIGH (ref 70–99)
Potassium: 3.2 mmol/L — ABNORMAL LOW (ref 3.5–5.1)
Sodium: 137 mmol/L (ref 135–145)

## 2021-07-22 LAB — MAGNESIUM: Magnesium: 1.8 mg/dL (ref 1.7–2.4)

## 2021-07-22 NOTE — Progress Notes (Signed)
Primary Care Physician: Eulas Post, MD Referring Physician:Dr. Martinique    Julie Norton is a 88 y.o. female with a h/o afib, RBBB, DM, peripheral neuropathy, that is in the afib clinic for options for  restoring SR.   She was seen by Dr. Elease Hashimoto on 09/20/17. Found to be in Afib with rate 95. Started on Eliquis. Follow up Echo showed mild biatrial enlargement and normal LV function. She  noted some fatigue, decreased energy and leg swelling. Some dyspnea and chest tightness. Was started on lasix with improvement of these symptoms. She subsequently underwent TEE guided DCCV on 10/28/17 with return to NSR.   She  was  seen in early September 2020 by Dr. Martinique,  with  increased leg swelling for 2 months. Noted SOB going up stairs. Was found to be in Afib with rate 105. Toprol XL was added for rate control. Lasix was increased.  He  did try her on Flecainide but this resulted in severe nausea and diarrhea and was discontinued.    She subsequently underwent successful DCCV on 04/06/19. Seen back on October 27 and was in NSR. Dr Burchette's note of Dec 18 thought she was back in Afib but no Ecg done. Rate controlled. When seen in Dr. Doug Sou office,  she was in Afib.  She was started on amiodarone  at 400 mg daily. She underwent repeat DCCV on March 5 successfully. Due to some nausea, amiodarone was reduced to 200 mg daily.    She did develop side effects on amiodarone and this was discontinued in December 2021. Had symptoms despite reduction in dose to 100 mg daily. She reports feeling much better after drug stopped.   Repeat Echo showed normal LV function. There was increased RA size and TR. BNP was 180. UA negative for proteinuria.   Her husband died in 06-Mar-2021  and this has brought on increased stress. When seen in 2023/03/07, she was in rhythm.   She reported to Dr. Martinique on visit 06/13/21,  over the last 2 days she had felt out of rhythm. She noted significant increase in weight,  swelling, and SOB. Some chest tightness. Has been compliant with medication but notes she missed one dose of Eliquis on Dec 1 which was very unusual for her.    Dr. Martinique wanted her to be evaluated here to see if Tikosyn could be an option. She is here with her daughter today but she does live alone at her home.   F/u in afib clinic,  07/22/21 for Tikosyn admit. Pt is aware that [name] will be attempted, hope to restore  SR but not ideal situation with renal clearance only allowing 125 mcg  dofetilide bid and qt length  in SR (470 ms with RBBB contributing). QT today is 490 ms in afib.  Pre admit labs today show K+ at 3.2 and mag at 1.8, with crcl cal  at 33.61.  Labs not optimal to receive drug today, so will increase daily K+ and add magnesium and recheck labs next week to see if able to purse hospitalization for tikosyn. She does states that she had several loose stools a few days ago which have possibly led to the low K+/mag  Today, she denies symptoms of palpitations, chest pain, shortness of breath, orthopnea, PND, lower extremity edema, dizziness, presyncope, syncope, or neurologic sequela. The patient is tolerating medications without difficulties and is otherwise without complaint today.   Past Medical History:  Diagnosis Date   Abdominal adhesions  Abdominal gas pain    suspected -- may be related to intermittent right upper quadrant discomfort radiating around her back       Anemia    Anginal pain (HCC)    Arthritis    Atrial fibrillation (HCC)    Breast cancer (HCC)    Breast mass    left breast   Cancer (HCC)    left breast   Chest pain    intermittent right upper quadrant discomfort radiating around her back       Complication of anesthesia    Diabetes mellitus without complication (HCC)    Type II   Dyspepsia    Dyspnea    GERD (gastroesophageal reflux disease)    H/O: hysterectomy 1978   History of cardioversion    Hypercholesteremia    Hypothyroidism    Mitral  insufficiency    Personal history of radiation therapy    Pneumonia    PONV (postoperative nausea and vomiting)    Right bundle branch block (RBBB)    Tendinitis    of the right arm and shoulder   Torn tendon    right shoulder   Past Surgical History:  Procedure Laterality Date   ABDOMINAL HYSTERECTOMY     APPENDECTOMY  1978   BLADDER REPAIR     BREAST LUMPECTOMY Left 02/24/2011   central partial mastectomy - dr Margot Chimes   BREAST REDUCTION SURGERY     CARDIOVERSION N/A 10/28/2017   Procedure: CARDIOVERSION;  Surgeon: Lelon Perla, MD;  Location: [facility];  Service: Cardiovascular;  Laterality: N/A;   CARDIOVERSION N/A 04/06/2019   Procedure: CARDIOVERSION;  Surgeon: [name], MD;  Location: [facility] ENDOSCOPY;  Service: Cardiovascular;  Laterality: N/A;   CARDIOVERSION N/A 09/08/2019   Procedure: CARDIOVERSION;  Surgeon: Dorothy Spark, MD;  Location: [facility] ENDOSCOPY;  Service: Cardiovascular;  Laterality: N/A;   COLONOSCOPY WITH PROPOFOL N/A 03/24/2017   Procedure: COLONOSCOPY WITH PROPOFOL;  Surgeon: Wilford Corner, MD;  Location[facility] ENDOSCOPY;  Service: Endoscopy;  Laterality: N/A;   ESOPHAGOGASTRODUODENOSCOPY (EGD) WITH PROPOFOL N/A 03/24/2017   Procedure: ESOPHAGOGASTRODUODENOSCOPY (EGD) WITH PROPOFOL;  Surgeon: Wilford Corner, MD;  Location: [facility];  Service: Endoscopy;  Laterality: N/A;   LIPOMA EXCISION Right    REDUCTION MAMMAPLASTY Bilateral 1998   RETINAL DETACHMENT SURGERY Left    SALPINGOOPHORECTOMY     TEE WITHOUT CARDIOVERSION N/A 10/28/2017   Procedure: TRANSESOPHAGEAL ECHOCARDIOGRAM (TEE);  Surgeon: Lelon Perla, MD;  Location: [facility] ENDOSCOPY;  Service: Cardiovascular;  Laterality: N/A;   [place]   at 88 years of age    Current Outpatient Medications  Medication Sig Dispense Refill   acetaminophen (TYLENOL) 500 MG tablet Take 1,000 mg by mouth every 6 (six) hours as needed for mild pain or moderate pain.       CALCIUM PO Take 1 tablet by mouth daily.     Cholecalciferol (VITAMIN D3) 125 MCG (5000 UT) CAPS TAKE ONE CAPSULE BY MOUTH EVERY MORNING 90 capsule 0   ELIQUIS 5 MG TABS tablet TAKE ONE TABLET BY MOUTH EVERY MORNING and TAKE ONE TABLET BY MOUTH EVERY EVENING 180 tablet 1   ferrous sulfate 325 (65 FE) MG EC tablet Take 325 mg daily  3   glucose blood (ONE TOUCH ULTRA TEST) test strip Check once daily. E11.40 100 each 11   insulin degludec (TRESIBA FLEXTOUCH) 100 UNIT/ML FlexTouch Pen Inject 24 units into THE SKIN ONCE daily AT 10pm (Patient taking differently: Inject 14 units into THE SKIN  ONCE daily AT 10pm) 15 mL 3   Insulin Pen Needle 29G X 5MM MISC Use once daily 100 each 3   levothyroxine (SYNTHROID) 50 MCG tablet Take 1 tablet (50 mcg total) by mouth daily. 90 tablet 1   loratadine (CLARITIN) 10 MG tablet Take 10 mg by mouth daily as needed for allergies.     metFORMIN (GLUCOPHAGE-XR) 500 MG 24 hr tablet Take 1 tablet (500 mg total) by mouth daily. 90 tablet 1   metoprolol succinate (TOPROL-XL) 25 MG 24 hr tablet Take 1 tablet (25 mg total) by mouth daily. 90 tablet 3   omeprazole (PRILOSEC) 20 MG capsule Take 1 capsule (20 mg total) by mouth 2 (two) times daily. 180 capsule 1   ONE TOUCH ULTRA TEST test strip CHECK ONCE A DAY 50 each 3   potassium chloride (KLOR-CON) 10 MEQ tablet TAKE ONE TABLET BY MOUTH EVERY MORNING 90 tablet 0   torsemide (DEMADEX) 20 MG tablet Take 1 tablet (20 mg total) by mouth 2 (two) times daily. 180 tablet 3   vitamin B-12 (CYANOCOBALAMIN) 1000 MCG tablet Take 1 tablet (1,000 mcg total) by mouth daily. 90 tablet 3   zolpidem (AMBIEN) 5 MG tablet Take 1 tablet (5 mg total) by mouth at bedtime as needed for sleep. (Patient not taking: Reported on 07/22/2021) 30 tablet 0   No current facility-administered medications for this encounter.    Allergies  Allergen Reactions   Bactrim Nausea And Vomiting   Dilaudid [Hydromorphone Hcl] Other (See Comments)    Unknown    Hydrocodone Nausea And Vomiting   Invokana [Canagliflozin] Itching and Other (See Comments)    Yeast Infections    Macrodantin Nausea And Vomiting    Social History   Socioeconomic History   Marital status: Widowed    Spouse name: Not on file   Number of children: Not on file   Years of education: Not on file   Highest education level: Not on file  Occupational History   Not on file  Tobacco Use   Smoking status: Never   Smokeless tobacco: Never  Vaping Use   Vaping Use: Never used  Substance and Sexual Activity   Alcohol use: No   Drug use: No   Sexual activity: Not on file  Other Topics Concern   Not on file  Social History Narrative   Not on file   Social Determinants of Health   Financial Resource Strain: Low Risk    Difficulty of Paying Living Expenses: Not hard at all  Food Insecurity: No Food Insecurity   Worried About Running Out of Food in the Last Year: Never true   [place] in the Last Year: Never true  Transportation Needs: No Transportation Needs   Lack of Transportation (Medical): No   Lack of Transportation (Non-Medical): No  Physical Activity: Inactive   Days of Exercise per Week: 0 days   Minutes of Exercise per Session: 0 min  Stress: No Stress Concern Present   Feeling of Stress : Only a little  Social Connections: Moderately Isolated   Frequency of Communication with Friends and Family: More than three times a week   Frequency of Social Gatherings with Friends and Family: Once a week   Attends Religious Services: Never   Marine scientist or Organizations: No   Attends Archivist Meetings: Never   Marital Status: Married  Human resources officer Violence: Not At Risk   Fear of Current or Ex-Partner: No  Emotionally Abused: No   Physically Abused: No   Sexually Abused: No    Family History  Problem Relation Age of Onset   Pneumonia Father    Heart attack Mother    Hypertension Mother    Stroke Mother    Diabetes  Mother    Aortic aneurysm Mother        abdominal aortic aneurysm   Cancer Mother        bladder   Aortic aneurysm Sister        abdominal aortic aneurysm    ROS- All systems are reviewed and negative except as per the HPI above  Physical Exam: Vitals:   07/22/21 0915  BP: (!) 96/54  Pulse: 76  Weight: 58.2 kg  Height: 5\' 1"  (1.549 m)   Wt Readings from Last 3 Encounters:  07/22/21 58.2 kg  06/19/21 61 kg  06/13/21 64.1 kg    Labs: Lab Results  Component Value Date   NA 141 06/13/2021   K 4.1 06/13/2021   CL 104 06/13/2021   CO2 25 06/13/2021   GLUCOSE 125 (H) 06/13/2021   BUN 13 06/13/2021   CREATININE 0.96 06/13/2021   CALCIUM 9.2 06/13/2021   MG 1.9 06/13/2021   Lab Results  Component Value Date   INR 1.2 10/12/2017   Lab Results  Component Value Date   CHOL 94 06/23/2019   HDL 35.20 (L) 06/23/2019   LDLCALC 45 06/23/2019   TRIG 70.0 06/23/2019     GEN- The patient is well appearing, alert and oriented x 3 today.   Head- normocephalic, atraumatic Eyes-  Sclera clear, conjunctiva pink Ears- hearing intact Oropharynx- clear Neck- supple, no JVP Lymph- no cervical lymphadenopathy Lungs- Clear to ausculation bilaterally, normal work of breathing Heart- irregular rate and rhythm, no murmurs, rubs or gallops, PMI not laterally displaced GI- soft, NT, ND, + BS Extremities- no clubbing, cyanosis, or edema MS- no significant deformity or atrophy Skin- no rash or lesion Psych- euthymic mood, full affect Neuro- strength and sensation are intact  EKG-afib at 76 bpm, qrs int 140 ms, qtc 506 ms( RBBB contributing )  In SR in March and August  qtc was 503 and 461 ms   left ventricle has normal function. The left ventricle has no regional  wall motion abnormalities. Left ventricular diastolic parameters are  consistent with Grade II diastolic  dysfunction (pseudonormalization).   2. Right ventricular systolic function is mildly reduced. The right   ventricular size is mildly enlarged. There is normal pulmonary artery  systolic pressure.   3. Right atrial size was severely dilated.   4. The mitral valve is normal in structure. Trivial mitral valve  regurgitation.   5. Tricuspid valve regurgitation is moderate.   6. The aortic valve is tricuspid. There is mild thickening of the aortic  valve. Aortic valve regurgitation is not visualized. No aortic stenosis is  present.   7. Aortic dilatation noted. There is mild dilatation of the aortic root,  measuring 36 mm. There is mild dilatation of the ascending aorta,  measuring 37 mm.   8. The inferior vena cava is dilated in size with <50% respiratory  variability, suggesting right atrial pressure of 15 mmHg.   Assessment and Plan:  1. Persistent symptomatic rate controlled afib  Pt has failed flecainide and amiodarone  She is her for tikosyn admit  Tikosyn is her only option although not ideal, as her crcl cal is only 33 ml/min so it means that she would qualify  for the lowest dose of tikosyn, 125 mcg, so no wiggle  room if dose has to be reduced Also her qtc is questionable, 490 ms, today, in afib and running 460 to 503 in SR, usually like a qt around 440 ms to start tikosyn, RBBB is contributing so that has to be taken into consideration  She also has RT sided failure which also may be contributing to her LLE and her shortness of breath, so unclear how much her symptoms will improve with return of SR  Her K+ is only 3.2 today with mag of 1.8. She does not qualify for Tikosyn admit today. Will replace K+ to 4 and mag to 2, with increase po K+ to 20 meq daily and will add magnesium to 200 mg daily. Will recheck labs early nest week and if in range will reschedule Tikosyn admit  Continue metoprolol and torsemide at current doses, weight/LEE is stable   2. CHA2DS2VASc  score of 4 Reminded not to miss any more of her eliquis 5 mg bid with possible tikosyn admit      Recheck labs next Monday  and plans will be made accordingly   Butch Penny C. Kawehi Hostetter, [facility] [address] [place], [place] [address] [phone]

## 2021-07-24 ENCOUNTER — Other Ambulatory Visit (HOSPITAL_COMMUNITY): Payer: Self-pay | Admitting: *Deleted

## 2021-07-24 ENCOUNTER — Telehealth (HOSPITAL_COMMUNITY): Payer: Self-pay | Admitting: *Deleted

## 2021-07-24 MED ORDER — POTASSIUM CHLORIDE ER 10 MEQ PO TBCR: 10.0000 meq | EXTENDED_RELEASE_TABLET | Freq: Two times a day (BID) | ORAL | 1 refills | Status: DC

## 2021-07-24 NOTE — Telephone Encounter (Signed)
Pt called in stating she accidentally missed her PM dose of Eliquis last night 1/18. Will take into account regarding Tikosyn admission scheduling.

## 2021-07-28 ENCOUNTER — Other Ambulatory Visit: Payer: Self-pay

## 2021-07-28 ENCOUNTER — Ambulatory Visit (HOSPITAL_COMMUNITY)
Admission: RE | Admit: 2021-07-28 | Discharge: 2021-07-28 | Disposition: A | Discharge status: Home / Self Care | Payer: Medicare Other | Source: Ambulatory Visit | Attending: Physician Assistant | Admitting: Physician Assistant

## 2021-07-28 DIAGNOSIS — I4819 Other persistent atrial fibrillation: Secondary | ICD-10-CM | POA: Diagnosis not present

## 2021-07-28 LAB — BASIC METABOLIC PANEL
Anion gap: 11 (ref 5–15)
BUN: 16 mg/dL (ref 8–23)
CO2: 27 mmol/L (ref 22–32)
Calcium: 9.8 mg/dL (ref 8.9–10.3)
Chloride: 104 mmol/L (ref 98–111)
Creatinine, Ser: 1.09 mg/dL — ABNORMAL HIGH (ref 0.44–1.00)
GFR, Estimated: 49 mL/min — ABNORMAL LOW (ref >60)
Glucose, Bld: 255 mg/dL — ABNORMAL HIGH (ref 70–99)
Potassium: 3.7 mmol/L (ref 3.5–5.1)
Sodium: 142 mmol/L (ref 135–145)

## 2021-07-31 DIAGNOSIS — H524 Presbyopia: Secondary | ICD-10-CM | POA: Diagnosis not present

## 2021-07-31 DIAGNOSIS — H35033 Hypertensive retinopathy, bilateral: Secondary | ICD-10-CM | POA: Diagnosis not present

## 2021-07-31 DIAGNOSIS — H3540 Unspecified peripheral retinal degeneration: Secondary | ICD-10-CM | POA: Diagnosis not present

## 2021-07-31 DIAGNOSIS — H52223 Regular astigmatism, bilateral: Secondary | ICD-10-CM | POA: Diagnosis not present

## 2021-07-31 DIAGNOSIS — E119 Type 2 diabetes mellitus without complications: Secondary | ICD-10-CM | POA: Diagnosis not present

## 2021-07-31 DIAGNOSIS — H5203 Hypermetropia, bilateral: Secondary | ICD-10-CM | POA: Diagnosis not present

## 2021-07-31 DIAGNOSIS — H40053 Ocular hypertension, bilateral: Secondary | ICD-10-CM | POA: Diagnosis not present

## 2021-07-31 LAB — HM DIABETES EYE EXAM

## 2021-08-01 ENCOUNTER — Telehealth: Payer: Self-pay | Admitting: Family Medicine

## 2021-08-01 NOTE — Telephone Encounter (Signed)
Patient called in stating that she have a history with sleep walking and she said one of the side effects for AMBIEN is sleep walking. Patient wants someone to let her know if she should continue to take this.  Patient could be contacted at [phone].  Please advise.

## 2021-08-04 NOTE — Telephone Encounter (Signed)
Spoke with the patient, she is aware of Dr. Erick Blinks message.

## 2021-08-07 ENCOUNTER — Other Ambulatory Visit: Payer: Self-pay | Admitting: Family Medicine

## 2021-08-07 ENCOUNTER — Other Ambulatory Visit: Payer: Self-pay | Admitting: Cardiology

## 2021-08-07 DIAGNOSIS — E0590 Thyrotoxicosis, unspecified without thyrotoxic crisis or storm: Secondary | ICD-10-CM

## 2021-08-07 NOTE — Telephone Encounter (Signed)
Ambien last filled 07/15/2021 Last OV 05/07/2021  Ok to fill?

## 2021-08-08 ENCOUNTER — Other Ambulatory Visit (HOSPITAL_COMMUNITY): Payer: Self-pay | Admitting: *Deleted

## 2021-08-08 ENCOUNTER — Telehealth: Payer: Self-pay | Admitting: Pharmacist

## 2021-08-08 MED ORDER — POTASSIUM CHLORIDE ER 10 MEQ PO TBCR
10.0000 meq | EXTENDED_RELEASE_TABLET | Freq: Three times a day (TID) | ORAL | 1 refills | Status: DC
Start: 2021-08-08 — End: 2021-09-16

## 2021-08-08 NOTE — Progress Notes (Addendum)
Chronic Care Management Pharmacy Assistant   Name: Julie Norton  MRN: [id number] DOB: [birth year]   Reason for Encounter: Medication Review / Medication Review   Conditions to be addressed/monitored: DMII  Recent office visits:  None   Recent consult visits:  07/22/21 [name], NP ( Cardiology) - Patient presented for persistent atrial fibrillation and other concerns. No medication changes noted.  Hospital visits:  None in previous 6 months  Medications: Outpatient Encounter Medications as of 08/08/2021  Medication Sig   levothyroxine (SYNTHROID) 50 MCG tablet TAKE ONE TABLET BY MOUTH BEFORE BREAKFAST   metoprolol succinate (TOPROL-XL) 25 MG 24 hr tablet TAKE ONE TABLET BY MOUTH EVERY MORNING   omeprazole (PRILOSEC) 20 MG capsule TAKE ONE CAPSULE BY MOUTH BEFORE BREAKFAST and TAKE ONE CAPSULE BY MOUTH EVERY EVENING   acetaminophen (TYLENOL) 500 MG tablet Take 1,000 mg by mouth every 6 (six) hours as needed for mild pain or moderate pain.    CALCIUM PO Take 1 tablet by mouth daily.   Cholecalciferol (VITAMIN D3) 125 MCG (5000 UT) CAPS TAKE ONE CAPSULE BY MOUTH EVERY MORNING   ELIQUIS 5 MG TABS tablet TAKE ONE TABLET BY MOUTH EVERY MORNING and TAKE ONE TABLET BY MOUTH EVERY EVENING   ferrous sulfate 325 (65 FE) MG EC tablet Take 325 mg daily   glucose blood (ONE TOUCH ULTRA TEST) test strip Check once daily. E11.40   insulin degludec (TRESIBA FLEXTOUCH) 100 UNIT/ML FlexTouch Pen Inject 24 units into THE SKIN ONCE daily AT 10pm (Patient taking differently: Inject 14 units into THE SKIN ONCE daily AT 10pm)   Insulin Pen Needle 29G X 5MM MISC Use once daily   loratadine (CLARITIN) 10 MG tablet Take 10 mg by mouth daily as needed for allergies.   metFORMIN (GLUCOPHAGE-XR) 500 MG 24 hr tablet Take 1 tablet (500 mg total) by mouth daily.   ONE TOUCH ULTRA TEST test strip CHECK ONCE A DAY   potassium chloride (KLOR-CON) 10 MEQ tablet Take 1 tablet (10 mEq total) by mouth 2  (two) times daily.   torsemide (DEMADEX) 20 MG tablet Take 1 tablet (20 mg total) by mouth 2 (two) times daily.   vitamin B-12 (CYANOCOBALAMIN) 1000 MCG tablet Take 1 tablet (1,000 mcg total) by mouth daily.   zolpidem (AMBIEN) 5 MG tablet Take 1 tablet (5 mg total) by mouth at bedtime as needed for sleep. (Patient not taking: Reported on 07/22/2021)   No facility-administered encounter medications on file as of 08/08/2021.  Recent Relevant Labs: Lab Results  Component Value Date/Time   HGBA1C 10.5 (H) 01/21/2021 03:43 PM   HGBA1C 7.8 (A) 07/29/2020 01:37 PM   HGBA1C 9.1 (H) 04/22/2020 04:17 PM   MICROALBUR 0.9 04/07/2013 07:58 AM    Kidney Function Lab Results  Component Value Date/Time   CREATININE 1.09 (H) 07/28/2021 11:09 AM   CREATININE 1.04 (H) 07/22/2021 09:49 AM   CREATININE 0.81 04/22/2020 04:17 PM   CREATININE 0.69 07/30/2016 08:08 AM   CREATININE 0.8 04/03/2014 10:26 AM   CREATININE 0.8 03/20/2013 09:10 AM   GFR 54.77 (L) 01/21/2021 03:43 PM   GFRNONAA 49 (L) 07/28/2021 11:09 AM   GFRAA 75 06/13/2020 02:15 PM    Current antihyperglycemic regimen:  Metformin ER 500mg , 1 tablet twice daily - taking once a day Tresiba Flextouch, inject 24 units once daily at 10 PM What recent interventions/DTPs have been made to improve glycemic control:  Patient reports no changes Have there been any recent hospitalizations or  ED visits since last visit with CPP? No Patient denies hypoglycemic symptoms, including None Patient denies hyperglycemic symptoms, including none How often are you checking your blood sugar? before breakfast What are your blood sugars ranging?  Fasting: 103 today and 84 yesterday, she reports its in the 80's often and when it is she is not using her insulin on those days During the week, how often does your blood glucose drop below 70? Never  Adherence Review: Is the patient currently on a STATIN medication? No Is the patient currently on ACE/ARB medication?  No Does the patient have >5 day gap between last estimated fill dates? No  Reviewed chart for medication changes ahead of medication coordination call.  No OVs, Consults, or hospital visits since last care coordination call/Pharmacist visit.   No medication changes indicated  BP Readings from Last 3 Encounters:  07/22/21 (!) 96/54  06/19/21 (!) 112/56  06/13/21 130/66    Lab Results  Component Value Date   HGBA1C 10.5 (H) 01/21/2021     Patient obtains medications through Adherence Packaging  30 Days   Last adherence delivery included:   Trazodone 50mg  - 1/2-1 tablet at bedtime as needed Potassium ER 10MEQ - daily with breakfast Torsemide 20mg  - daily with breakfast and dinner Omeprazole 20mg  -  1 tablet daily before breakfast and 1 tablet before evening meal Levothyroxine 80mcg - take 1 tablet before breakfast. Metoprolol ER 25mg  - daily with breakfast  Eliquis 5mg  - take 1 tablet with breakfast and 1 tablet with dinner   Added Metformin 500 mg -   take one Evening Meal  Patient is due for next adherence delivery on: 08/19/21. Called patient and reviewed medications and coordinated delivery. Packaging for 30 DS  This delivery to include: Eliquis 5mg  - take 1 tablet with breakfast and 1 tablet with dinner Omeprazole 20mg  -  take 1 capsule daily before breakfast and 1 capsule before evening meal Levothyroxine 61mcg - take 1 tablet before breakfast Metformin 500 mg -   take one tablet at dinner Torsemide 20mg  - take one tablet with breakfast and one tablet at dinner Metoprolol ER 25mg  - take one tablet daily with breakfast Zolpidem (Ambien) 5 mg - take one tablet at bedtime PRN Potassium Chloride 10 meq- take 2 tablets at breakfast and one tablet at dinner (total 30 mEq)   Patient declined the following medications : Tresiba Flextouch 100u - Inject 24 units once daily (Patient reports she has 1 and a half boxes left will call when needed)   Confirmed delivery date of  08/19/21, advised patient that pharmacy will contact them the morning of delivery.   Notes: Patient reports she had been taking 3 Potassium Chloride since it was increased in January, Call to Cardiology to request updated prescription be sent to [facility] reports she will send updated prescription to reflect 1 tab of  30 meq daily. Patient reports she has a few pills left not enough to last till the 14 th to call me back with the amount she has. Patient reports she only has 5 pills left will need more sent to her on Monday to hold her over, acute short fill form filled out and uploaded also  Care Gaps: Zoster Vaccine - Overdue Urine Micro- Overdue Foot Exam - Overdue PNA Vaccine - Overdue COVID Vaccine - Overdue Eye Exam - Overdue HGB A1C - Overdue CCM - Need AWV- 3/23 Lab Results  Component Value Date   HGBA1C 10.5 (H) 01/21/2021  Star Rating Drugs: Metformin 500 mg - Last filled 07/16/21 30 DS at [facility]  Patient Assistance: Eliquis -  Denied in 2022 - 06/11/21 offered free trial card for eliquis per Jeni Salles patient accepted   [place] Clinical Pharmacist Assistant [phone]

## 2021-08-11 ENCOUNTER — Encounter: Payer: Self-pay | Admitting: Family Medicine

## 2021-08-18 DIAGNOSIS — M542 Cervicalgia: Secondary | ICD-10-CM | POA: Diagnosis not present

## 2021-08-19 ENCOUNTER — Ambulatory Visit (HOSPITAL_COMMUNITY): Payer: Medicare Other | Admitting: Nurse Practitioner

## 2021-08-22 ENCOUNTER — Other Ambulatory Visit (HOSPITAL_COMMUNITY): Payer: Self-pay | Admitting: Physician Assistant

## 2021-08-23 LAB — SARS CORONAVIRUS 2 (TAT 6-24 HRS): SARS Coronavirus 2: NEGATIVE

## 2021-08-26 ENCOUNTER — Ambulatory Visit (HOSPITAL_COMMUNITY)
Admission: RE | Admit: 2021-08-26 | Discharge: 2021-08-26 | Disposition: A | Discharge status: Home / Self Care | Payer: Medicare Other | Source: Ambulatory Visit | Attending: Nurse Practitioner | Admitting: Nurse Practitioner

## 2021-08-26 ENCOUNTER — Other Ambulatory Visit: Payer: Self-pay

## 2021-08-26 ENCOUNTER — Other Ambulatory Visit (HOSPITAL_COMMUNITY): Payer: Self-pay

## 2021-08-26 ENCOUNTER — Inpatient Hospital Stay (HOSPITAL_COMMUNITY)
Admission: AD | Admit: 2021-08-26 | Discharge: 2021-08-29 | DRG: 310 | Disposition: A | Discharge status: Home / Self Care | Payer: Medicare Other | Source: Ambulatory Visit | Attending: Cardiology | Admitting: Cardiology

## 2021-08-26 ENCOUNTER — Encounter (HOSPITAL_COMMUNITY): Payer: Self-pay | Admitting: Nurse Practitioner

## 2021-08-26 VITALS — BP 106/56 | HR 89 | Ht 61.0 in | Wt 125.2 lb

## 2021-08-26 DIAGNOSIS — I4510 Unspecified right bundle-branch block: Secondary | ICD-10-CM | POA: Diagnosis not present

## 2021-08-26 DIAGNOSIS — Z881 Allergy status to other antibiotic agents status: Secondary | ICD-10-CM | POA: Diagnosis not present

## 2021-08-26 DIAGNOSIS — Z7984 Long term (current) use of oral hypoglycemic drugs: Secondary | ICD-10-CM

## 2021-08-26 DIAGNOSIS — D638 Anemia in other chronic diseases classified elsewhere: Secondary | ICD-10-CM | POA: Diagnosis not present

## 2021-08-26 DIAGNOSIS — Z794 Long term (current) use of insulin: Secondary | ICD-10-CM | POA: Diagnosis not present

## 2021-08-26 DIAGNOSIS — Z90710 Acquired absence of both cervix and uterus: Secondary | ICD-10-CM | POA: Diagnosis not present

## 2021-08-26 DIAGNOSIS — E7800 Pure hypercholesterolemia, unspecified: Secondary | ICD-10-CM | POA: Diagnosis not present

## 2021-08-26 DIAGNOSIS — Z888 Allergy status to other drugs, medicaments and biological substances status: Secondary | ICD-10-CM | POA: Diagnosis not present

## 2021-08-26 DIAGNOSIS — Z923 Personal history of irradiation: Secondary | ICD-10-CM

## 2021-08-26 DIAGNOSIS — Z853 Personal history of malignant neoplasm of breast: Secondary | ICD-10-CM | POA: Diagnosis not present

## 2021-08-26 DIAGNOSIS — I4819 Other persistent atrial fibrillation: Principal | ICD-10-CM | POA: Diagnosis present

## 2021-08-26 DIAGNOSIS — D6869 Other thrombophilia: Secondary | ICD-10-CM

## 2021-08-26 DIAGNOSIS — Z885 Allergy status to narcotic agent status: Secondary | ICD-10-CM | POA: Diagnosis not present

## 2021-08-26 DIAGNOSIS — E039 Hypothyroidism, unspecified: Secondary | ICD-10-CM | POA: Diagnosis present

## 2021-08-26 DIAGNOSIS — K219 Gastro-esophageal reflux disease without esophagitis: Secondary | ICD-10-CM | POA: Diagnosis not present

## 2021-08-26 DIAGNOSIS — Z79890 Hormone replacement therapy (postmenopausal): Secondary | ICD-10-CM

## 2021-08-26 DIAGNOSIS — I4891 Unspecified atrial fibrillation: Secondary | ICD-10-CM | POA: Diagnosis not present

## 2021-08-26 DIAGNOSIS — Z7901 Long term (current) use of anticoagulants: Secondary | ICD-10-CM | POA: Diagnosis not present

## 2021-08-26 DIAGNOSIS — Z79899 Other long term (current) drug therapy: Secondary | ICD-10-CM

## 2021-08-26 DIAGNOSIS — E1142 Type 2 diabetes mellitus with diabetic polyneuropathy: Secondary | ICD-10-CM | POA: Diagnosis not present

## 2021-08-26 DIAGNOSIS — E119 Type 2 diabetes mellitus without complications: Secondary | ICD-10-CM | POA: Diagnosis not present

## 2021-08-26 DIAGNOSIS — M1990 Unspecified osteoarthritis, unspecified site: Secondary | ICD-10-CM | POA: Diagnosis not present

## 2021-08-26 LAB — BASIC METABOLIC PANEL
Anion gap: 8 (ref 5–15)
BUN: 16 mg/dL (ref 8–23)
CO2: 28 mmol/L (ref 22–32)
Calcium: 9.7 mg/dL (ref 8.9–10.3)
Chloride: 104 mmol/L (ref 98–111)
Creatinine, Ser: 0.98 mg/dL (ref 0.44–1.00)
GFR, Estimated: 55 mL/min — ABNORMAL LOW (ref >60)
Glucose, Bld: 266 mg/dL — ABNORMAL HIGH (ref 70–99)
Potassium: 3.9 mmol/L (ref 3.5–5.1)
Sodium: 140 mmol/L (ref 135–145)

## 2021-08-26 LAB — GLUCOSE, CAPILLARY
Glucose-Capillary: 247 mg/dL — ABNORMAL HIGH (ref 70–99)
Glucose-Capillary: 312 mg/dL — ABNORMAL HIGH (ref 70–99)

## 2021-08-26 LAB — MAGNESIUM: Magnesium: 2 mg/dL (ref 1.7–2.4)

## 2021-08-26 MED ORDER — POTASSIUM CHLORIDE CRYS ER 20 MEQ PO TBCR
40.0000 meq | EXTENDED_RELEASE_TABLET | Freq: Once | ORAL | Status: AC
  Administered 2021-08-26: 40 meq via ORAL
  Filled 2021-08-26: qty 2

## 2021-08-26 MED ORDER — FERROUS SULFATE 325 (65 FE) MG PO TABS
325.0000 mg | ORAL_TABLET | Freq: Every day | ORAL | Status: DC
  Administered 2021-08-27 – 2021-08-29 (×3): 325 mg via ORAL
  Filled 2021-08-26 (×3): qty 1

## 2021-08-26 MED ORDER — METFORMIN HCL ER 500 MG PO TB24
500.0000 mg | ORAL_TABLET | Freq: Every day | ORAL | Status: DC
  Administered 2021-08-27: 500 mg via ORAL
  Filled 2021-08-26: qty 1

## 2021-08-26 MED ORDER — SODIUM CHLORIDE 0.9 % IV SOLN: 250.0000 mL | INTRAVENOUS | Status: DC | PRN

## 2021-08-26 MED ORDER — OMEPRAZOLE 20 MG PO CPDR: 20.0000 mg | DELAYED_RELEASE_CAPSULE | ORAL | Status: DC | PRN

## 2021-08-26 MED ORDER — INSULIN DEGLUDEC 100 UNIT/ML ~~LOC~~ SOPN: 24.0000 [IU] | PEN_INJECTOR | Freq: Every day | SUBCUTANEOUS | Status: DC

## 2021-08-26 MED ORDER — TORSEMIDE 20 MG PO TABS
20.0000 mg | ORAL_TABLET | Freq: Two times a day (BID) | ORAL | Status: DC
Start: 2021-08-26 — End: 2021-08-29
  Administered 2021-08-26 – 2021-08-29 (×5): 20 mg via ORAL
  Filled 2021-08-26 (×6): qty 1

## 2021-08-26 MED ORDER — METOPROLOL SUCCINATE ER 25 MG PO TB24
25.0000 mg | ORAL_TABLET | Freq: Every morning | ORAL | Status: DC
Start: 2021-08-27 — End: 2021-08-29
  Administered 2021-08-27 – 2021-08-29 (×3): 25 mg via ORAL
  Filled 2021-08-26 (×4): qty 1

## 2021-08-26 MED ORDER — APIXABAN 5 MG PO TABS: 5.0000 mg | ORAL_TABLET | Freq: Two times a day (BID) | ORAL | Status: DC

## 2021-08-26 MED ORDER — POTASSIUM CHLORIDE CRYS ER 10 MEQ PO TBCR
10.0000 meq | EXTENDED_RELEASE_TABLET | Freq: Three times a day (TID) | ORAL | Status: DC
  Administered 2021-08-26 – 2021-08-28 (×7): 10 meq via ORAL
  Filled 2021-08-26 (×12): qty 1

## 2021-08-26 MED ORDER — DOFETILIDE 125 MCG PO CAPS
125.0000 ug | ORAL_CAPSULE | Freq: Two times a day (BID) | ORAL | Status: DC
  Administered 2021-08-26 – 2021-08-29 (×6): 125 ug via ORAL
  Filled 2021-08-26 (×10): qty 1

## 2021-08-26 MED ORDER — MAGNESIUM SULFATE 2 GM/50ML IV SOLN
2.0000 g | Freq: Once | INTRAVENOUS | Status: AC
  Administered 2021-08-26: 2 g via INTRAVENOUS
  Filled 2021-08-26: qty 50

## 2021-08-26 MED ORDER — FERROUS SULFATE 325 (65 FE) MG PO TABS
325.0000 mg | ORAL_TABLET | Freq: Every day | ORAL | Status: DC
  Filled 2021-08-26: qty 1

## 2021-08-26 MED ORDER — SODIUM CHLORIDE 0.9% FLUSH
3.0000 mL | Freq: Two times a day (BID) | INTRAVENOUS | Status: DC
  Administered 2021-08-27 – 2021-08-29 (×3): 3 mL via INTRAVENOUS

## 2021-08-26 MED ORDER — LEVOTHYROXINE SODIUM 50 MCG PO TABS
50.0000 ug | ORAL_TABLET | Freq: Every day | ORAL | Status: DC
  Administered 2021-08-27 – 2021-08-29 (×3): 50 ug via ORAL
  Filled 2021-08-26 (×3): qty 1

## 2021-08-26 MED ORDER — INSULIN GLARGINE-YFGN 100 UNIT/ML ~~LOC~~ SOLN
24.0000 [IU] | Freq: Every day | SUBCUTANEOUS | Status: DC
  Administered 2021-08-26: 24 [IU] via SUBCUTANEOUS
  Filled 2021-08-26 (×2): qty 0.24

## 2021-08-26 MED ORDER — ACETAMINOPHEN 500 MG PO TABS
1000.0000 mg | ORAL_TABLET | Freq: Four times a day (QID) | ORAL | Status: DC | PRN
  Administered 2021-08-26 – 2021-08-29 (×5): 1000 mg via ORAL
  Filled 2021-08-26 (×5): qty 2

## 2021-08-26 MED ORDER — VITAMIN B-12 1000 MCG PO TABS
1000.0000 ug | ORAL_TABLET | Freq: Every day | ORAL | Status: DC
  Administered 2021-08-27 – 2021-08-29 (×3): 1000 ug via ORAL
  Filled 2021-08-26 (×3): qty 1

## 2021-08-26 MED ORDER — APIXABAN 5 MG PO TABS
5.0000 mg | ORAL_TABLET | Freq: Two times a day (BID) | ORAL | Status: DC
  Administered 2021-08-26 – 2021-08-29 (×6): 5 mg via ORAL
  Filled 2021-08-26 (×6): qty 1

## 2021-08-26 MED ORDER — SODIUM CHLORIDE 0.9% FLUSH: 3.0000 mL | INTRAVENOUS | Status: DC | PRN

## 2021-08-26 NOTE — TOC Benefit Eligibility Note (Signed)
Patient Teacher, English as a foreign language completed.    The patient is currently admitted and upon discharge could be taking dofetilide (Tikosyn) 125 mcg capsules.  The current 30 day co-pay is, $47.00.   The patient is insured through [place], [place] Patient Advocate Specialist [place] Patient Advocate Team Direct Number: [phone]  Fax: [phone]

## 2021-08-26 NOTE — Progress Notes (Signed)
Dr Quentin Ore paged. Pt arrived to 6E20

## 2021-08-26 NOTE — Progress Notes (Signed)
Pt admitted to [place], VS wnL and as per flow. aFib on telemetry rate in 80s. Pt oriented to 6E processes. Pt familiar with the [facility] system. All questions and concerns addressed. Call bell placed within reach, will continue to monitor and maintain safety.

## 2021-08-26 NOTE — Progress Notes (Signed)
Primary Care Physician: Eulas Post, MD Referring Physician:Dr. Martinique    Julie Norton is a 88 y.o. female with a h/o afib, RBBB, DM, peripheral neuropathy, that is in the afib clinic for options for  restoring SR.   She was seen by Dr. Elease Hashimoto on 09/20/17. Found to be in Afib with rate 95. Started on Eliquis. Follow up Echo showed mild biatrial enlargement and normal LV function. She  noted some fatigue, decreased energy and leg swelling. Some dyspnea and chest tightness. Was started on lasix with improvement of these symptoms. She subsequently underwent TEE guided DCCV on 10/28/17 with return to NSR.   She  was  seen in early September 2020 by Dr. Martinique,  with  increased leg swelling for 2 months. Noted SOB going up stairs. Was found to be in Afib with rate 105. Toprol XL was added for rate control. Lasix was increased.  He  did try her on Flecainide but this resulted in severe nausea and diarrhea and was discontinued.    She subsequently underwent successful DCCV on 04/06/19. Seen back on October 27 and was in NSR. Dr Burchette's note of Dec 18 thought she was back in Afib but no Ecg done. Rate controlled. When seen in Dr. Doug Sou office,  she was in Afib.  She was started on amiodarone  at 400 mg daily. She underwent repeat DCCV on March 5 successfully. Due to some nausea, amiodarone was reduced to 200 mg daily.    She did develop side effects on amiodarone and this was discontinued in December 2021. Had symptoms despite reduction in dose to 100 mg daily. She reports feeling much better after drug stopped.   Repeat Echo showed normal LV function. There was increased RA size and TR. BNP was 180. UA negative for proteinuria.   Her husband died in 18-Mar-2021  and this has brought on increased stress. When seen in 19-Mar-2023, she was in rhythm.   She reported to Dr. Martinique on visit 06/13/21,  over the last 2 days she had felt out of rhythm. She noted significant increase in weight,  swelling, and SOB. Some chest tightness. Has been compliant with medication but notes she missed one dose of Eliquis on Dec 1 which was very unusual for her.    Dr. Martinique wanted her to be evaluated here to see if Tikosyn could be an option. She is here with her daughter today but she does live alone at her home.   F/u in afib clinic,  07/22/21 for Tikosyn admit. Pt is aware that [name] will be attempted, hope to restore  SR but not ideal situation with renal clearance only allowing 125 mcg  dofetilide bid and qt length  in SR (470 ms with RBBB contributing). QT today is 490 ms in afib.  Pre admit labs today show K+ at 3.2 and mag at 1.8, with crcl cal  at 33.61.  Labs not optimal to receive drug today, so will increase daily K+ and add magnesium and recheck labs next week to see if able to purse hospitalization for tikosyn. She does states that she had several loose stools a few days ago which have possibly led to the low K+/mag.  Pt is back today, 08/28/21 for attempting Tikosyn admit again. Her labs today look more optimal with a K+ of 3.8 and mag at 2.0. with crcl cal at 34 ml/min, she would only qualify for 125 mcg Tikosyn bid.   Today, she denies symptoms of  palpitations, chest pain, shortness of breath, orthopnea, PND, lower extremity edema, dizziness, presyncope, syncope, or neurologic sequela. The patient is tolerating medications without difficulties and is otherwise without complaint today.   Past Medical History:  Diagnosis Date   Abdominal adhesions    Abdominal gas pain    suspected -- may be related to intermittent right upper quadrant discomfort radiating around her back       Anemia    Anginal pain (HCC)    Arthritis    Atrial fibrillation (HCC)    Breast cancer (HCC)    Breast mass    left breast   Cancer (HCC)    left breast   Chest pain    intermittent right upper quadrant discomfort radiating around her back       Complication of anesthesia    Diabetes mellitus without  complication (HCC)    Type II   Dyspepsia    Dyspnea    GERD (gastroesophageal reflux disease)    H/O: hysterectomy 1978   History of cardioversion    Hypercholesteremia    Hypothyroidism    Mitral insufficiency    Personal history of radiation therapy    Pneumonia    PONV (postoperative nausea and vomiting)    Right bundle branch block (RBBB)    Tendinitis    of the right arm and shoulder   Torn tendon    right shoulder   Past Surgical History:  Procedure Laterality Date   ABDOMINAL HYSTERECTOMY     APPENDECTOMY  1978   BLADDER REPAIR     BREAST LUMPECTOMY Left 02/24/2011   central partial mastectomy - dr Margot Chimes   BREAST REDUCTION SURGERY     CARDIOVERSION N/A 10/28/2017   Procedure: CARDIOVERSION;  Surgeon: Lelon Perla, MD;  Location: [facility];  Service: Cardiovascular;  Laterality: N/A;   CARDIOVERSION N/A 04/06/2019   Procedure: CARDIOVERSION;  Surgeon: [name], MD;  Location: [facility];  Service: Cardiovascular;  Laterality: N/A;   CARDIOVERSION N/A 09/08/2019   Procedure: CARDIOVERSION;  Surgeon: Dorothy Spark, MD;  Location: [facility];  Service: Cardiovascular;  Laterality: N/A;   COLONOSCOPY WITH PROPOFOL N/A 03/24/2017   Procedure: COLONOSCOPY WITH PROPOFOL;  Surgeon: Wilford Corner, MD;  Location[facility] ENDOSCOPY;  Service: Endoscopy;  Laterality: N/A;   ESOPHAGOGASTRODUODENOSCOPY (EGD) WITH PROPOFOL N/A 03/24/2017   Procedure: ESOPHAGOGASTRODUODENOSCOPY (EGD) WITH PROPOFOL;  Surgeon: Wilford Corner, MD;  Location: [facility];  Service: Endoscopy;  Laterality: N/A;   LIPOMA EXCISION Right    REDUCTION MAMMAPLASTY Bilateral 1998   RETINAL DETACHMENT SURGERY Left    SALPINGOOPHORECTOMY     TEE WITHOUT CARDIOVERSION N/A 10/28/2017   Procedure: TRANSESOPHAGEAL ECHOCARDIOGRAM (TEE);  Surgeon: Lelon Perla, MD;  Location: [facility] ENDOSCOPY;  Service: Cardiovascular;  Laterality: N/A;   [place]   at 88 years  of age    Current Outpatient Medications  Medication Sig Dispense Refill   acetaminophen (TYLENOL) 500 MG tablet Take 1,000 mg by mouth every 6 (six) hours as needed for mild pain or moderate pain.      CALCIUM PO Take 1 tablet by mouth daily.     Cholecalciferol (VITAMIN D3) 125 MCG (5000 UT) CAPS TAKE ONE CAPSULE BY MOUTH EVERY MORNING 90 capsule 0   ELIQUIS 5 MG TABS tablet TAKE ONE TABLET BY MOUTH EVERY MORNING and TAKE ONE TABLET BY MOUTH EVERY EVENING 180 tablet 1   ferrous sulfate 325 (65 FE) MG EC tablet Take 325 mg daily  3  glucose blood (ONE TOUCH ULTRA TEST) test strip Check once daily. E11.40 100 each 11   insulin degludec (TRESIBA FLEXTOUCH) 100 UNIT/ML FlexTouch Pen Inject 24 units into THE SKIN ONCE daily AT 10pm (Patient taking differently: Inject 14 units into THE SKIN ONCE daily AT 10pm) 15 mL 3   Insulin Pen Needle 29G X 5MM MISC Use once daily 100 each 3   levothyroxine (SYNTHROID) 50 MCG tablet TAKE ONE TABLET BY MOUTH BEFORE BREAKFAST 90 tablet 1   lidocaine (LIDODERM) 5 % 1 patch daily.     loratadine (CLARITIN) 10 MG tablet Take 10 mg by mouth daily as needed for allergies.     metFORMIN (GLUCOPHAGE-XR) 500 MG 24 hr tablet Take 1 tablet (500 mg total) by mouth daily. 90 tablet 1   metoprolol succinate (TOPROL-XL) 25 MG 24 hr tablet TAKE ONE TABLET BY MOUTH EVERY MORNING 90 tablet 3   ONE TOUCH ULTRA TEST test strip CHECK ONCE A DAY 50 each 3   potassium chloride (KLOR-CON) 10 MEQ tablet Take 1 tablet (10 mEq total) by mouth 3 (three) times daily. 270 tablet 1   torsemide (DEMADEX) 20 MG tablet Take 1 tablet (20 mg total) by mouth 2 (two) times daily. 180 tablet 3   vitamin B-12 (CYANOCOBALAMIN) 1000 MCG tablet Take 1 tablet (1,000 mcg total) by mouth daily. 90 tablet 3   zolpidem (AMBIEN) 5 MG tablet TAKE ONE TABLET BY MOUTH AT bedtime AS NEEDED FOR SLEEP 30 tablet 5   omeprazole (PRILOSEC) 20 MG capsule Take 1 capsule (20 mg total) by mouth as needed.     No  current facility-administered medications for this encounter.    Allergies  Allergen Reactions   Bactrim Nausea And Vomiting   Dilaudid [Hydromorphone Hcl] Other (See Comments)    Unknown   Hydrocodone Nausea And Vomiting   Invokana [Canagliflozin] Itching and Other (See Comments)    Yeast Infections    Macrodantin Nausea And Vomiting    Social History   Socioeconomic History   Marital status: Widowed    Spouse name: Not on file   Number of children: Not on file   Years of education: Not on file   Highest education level: Not on file  Occupational History   Not on file  Tobacco Use   Smoking status: Never   Smokeless tobacco: Never  Vaping Use   Vaping Use: Never used  Substance and Sexual Activity   Alcohol use: No   Drug use: No   Sexual activity: Not on file  Other Topics Concern   Not on file  Social History Narrative   Not on file   Social Determinants of Health   Financial Resource Strain: Low Risk    Difficulty of Paying Living Expenses: Not hard at all  Food Insecurity: No Food Insecurity   Worried About Running Out of Food in the Last Year: Never true   [place] in the Last Year: Never true  Transportation Needs: No Transportation Needs   Lack of Transportation (Medical): No   Lack of Transportation (Non-Medical): No  Physical Activity: Inactive   Days of Exercise per Week: 0 days   Minutes of Exercise per Session: 0 min  Stress: No Stress Concern Present   Feeling of Stress : Only a little  Social Connections: Moderately Isolated   Frequency of Communication with Friends and Family: More than three times a week   Frequency of Social Gatherings with Friends and Family: Once a week  Attends Religious Services: Never   Active Member of Clubs or Organizations: No   Attends Archivist Meetings: Never   Marital Status: Married  Human resources officer Violence: Not At Risk   Fear of Current or Ex-Partner: No   Emotionally Abused: No    Physically Abused: No   Sexually Abused: No    Family History  Problem Relation Age of Onset   Pneumonia Father    Heart attack Mother    Hypertension Mother    Stroke Mother    Diabetes Mother    Aortic aneurysm Mother        abdominal aortic aneurysm   Cancer Mother        bladder   Aortic aneurysm Sister        abdominal aortic aneurysm    ROS- All systems are reviewed and negative except as per the HPI above  Physical Exam: Vitals:   08/26/21 1133  BP: (!) 106/56  Pulse: 89  Weight: 56.8 kg  Height: 5\' 1"  (1.549 m)   Wt Readings from Last 3 Encounters:  08/26/21 56.8 kg  07/22/21 58.2 kg  06/19/21 61 kg    Labs: Lab Results  Component Value Date   NA 140 08/26/2021   K 3.9 08/26/2021   CL 104 08/26/2021   CO2 28 08/26/2021   GLUCOSE 266 (H) 08/26/2021   BUN 16 08/26/2021   CREATININE 0.98 08/26/2021   CALCIUM 9.7 08/26/2021   MG 2.0 08/26/2021   Lab Results  Component Value Date   INR 1.2 10/12/2017   Lab Results  Component Value Date   CHOL 94 06/23/2019   HDL 35.20 (L) 06/23/2019   LDLCALC 45 06/23/2019   TRIG 70.0 06/23/2019     GEN- The patient is well appearing, alert and oriented x 3 today.   Head- normocephalic, atraumatic Eyes-  Sclera clear, conjunctiva pink Ears- hearing intact Oropharynx- clear Neck- supple, no JVP Lymph- no cervical lymphadenopathy Lungs- Clear to ausculation bilaterally, normal work of breathing Heart- irregular rate and rhythm, no murmurs, rubs or gallops, PMI not laterally displaced GI- soft, NT, ND, + BS Extremities- no clubbing, cyanosis, or edema MS- no significant deformity or atrophy Skin- no rash or lesion Psych- euthymic mood, full affect Neuro- strength and sensation are intact  EKG-afib at 89  bpm, qrs int 138 ms, qtc 484  ms( RBBB contributing )  In SR in March and August  qtc was 503 and 461 ms   left ventricle has normal function. The left ventricle has no regional  wall motion  abnormalities. Left ventricular diastolic parameters are  consistent with Grade II diastolic  dysfunction (pseudonormalization).   2. Right ventricular systolic function is mildly reduced. The right  ventricular size is mildly enlarged. There is normal pulmonary artery  systolic pressure.   3. Right atrial size was severely dilated.   4. The mitral valve is normal in structure. Trivial mitral valve  regurgitation.   5. Tricuspid valve regurgitation is moderate.   6. The aortic valve is tricuspid. There is mild thickening of the aortic  valve. Aortic valve regurgitation is not visualized. No aortic stenosis is  present.   7. Aortic dilatation noted. There is mild dilatation of the aortic root,  measuring 36 mm. There is mild dilatation of the ascending aorta,  measuring 37 mm.   8. The inferior vena cava is dilated in size with <50% respiratory  variability, suggesting right atrial pressure of 15 mmHg.   Assessment and Plan:  1. Persistent symptomatic rate controlled afib  Pt has failed flecainide and amiodarone  She is her for tikosyn admit, referred by Dr. Martinique  Tikosyn is her only ADD option although not ideal, as her crcl cal is only 34 ml/min so it means that she would qualify for the lowest dose of tikosyn, 125 mcg, so no wiggle  room if dose has to be reduced Also her qtc is questionable, 484 ms, today, in afib and running 460 to 503 in SR, with  RBBB is contributing, so that has to be taken into consideration  She also has RT sided failure which also may be contributing to her LLE and her shortness of breath, so unclear how much her symptoms will improve with return of SR  She did stop trazodone for sleep, many weeks ago, to be able to start Tikosyn,  now on Ambien, it  does not help her sleep, makes her unsteady on feet and she fell Friday night after taking  and did hit her head Continue metoprolol and torsemide at current doses, weight/LEE is stable   2. [id number]  score of  4 States has not missed any eliquis 5 mg bid with possible tikosyn admit   for many weeks now    To Alex. Ammiel Guiney, [facility] [address] [place], [place] [address] [phone]

## 2021-08-26 NOTE — H&P (Signed)
Electrophysiology H&P  Note   Primary Care Physician: Eulas Post, MD Referring Physician:Dr. Martinique    Julie Norton is a 88 y.o. female with a h/o afib, RBBB, DM, peripheral neuropathy, that is in the afib clinic for options for  restoring SR.   She was seen by Dr. Elease Hashimoto on 09/20/17. Found to be in Afib with rate 95. Started on Eliquis. Follow up Echo showed mild biatrial enlargement and normal LV function. She  noted some fatigue, decreased energy and leg swelling. Some dyspnea and chest tightness. Was started on lasix with improvement of these symptoms. She subsequently underwent TEE guided DCCV on 10/28/17 with return to NSR.   She  was  seen in early September 2020 by Dr. Martinique,  with  increased leg swelling for 2 months. Noted SOB going up stairs. Was found to be in Afib with rate 105. Toprol XL was added for rate control. Lasix was increased.  He  did try her on Flecainide but this resulted in severe nausea and diarrhea and was discontinued.    She subsequently underwent successful DCCV on 04/06/19. Seen back on October 27 and was in NSR. Dr Burchette's note of Dec 18 thought she was back in Afib but no Ecg done. Rate controlled. When seen in Dr. Doug Sou office,  she was in Afib.  She was started on amiodarone  at 400 mg daily. She underwent repeat DCCV on March 5 successfully. Due to some nausea, amiodarone was reduced to 200 mg daily.    She did develop side effects on amiodarone and this was discontinued in December 2021. Had symptoms despite reduction in dose to 100 mg daily. She reports feeling much better after drug stopped.   Repeat Echo showed normal LV function. There was increased RA size and TR. BNP was 180. UA negative for proteinuria.   Her husband died in 2021-03-19  and this has brought on increased stress. When seen in 20-Mar-2023, she was in rhythm.   She reported to Dr. Martinique on visit 06/13/21,  over the last 2 days she had felt out of rhythm. She noted  significant increase in weight, swelling, and SOB. Some chest tightness. Has been compliant with medication but notes she missed one dose of Eliquis on Dec 1 which was very unusual for her.    Dr. Martinique wanted her to be evaluated here to see if Tikosyn could be an option. She is here with her daughter today but she does live alone at her home.   F/u in afib clinic,  07/22/21 for Tikosyn admit. Pt is aware that [name] will be attempted, hope to restore  SR but not ideal situation with renal clearance only allowing 125 mcg  dofetilide bid and qt length  in SR (470 ms with RBBB contributing). QT today is 490 ms in afib.  Pre admit labs today show K+ at 3.2 and mag at 1.8, with crcl cal  at 33.61.  Labs not optimal to receive drug today, so will increase daily K+ and add magnesium and recheck labs next week to see if able to purse hospitalization for tikosyn. She does states that she had several loose stools a few days ago which have possibly led to the low K+/mag.  Pt is back today, 08/28/21 for attempting Tikosyn admit again. Her labs today look more optimal with a K+ of 3.8 and mag at 2.0. with crcl cal at 34 ml/min, she would only qualify for 125 mcg Tikosyn bid.  Today, she denies symptoms of palpitations, chest pain, shortness of breath, orthopnea, PND, lower extremity edema, dizziness, presyncope, syncope, or neurologic sequela. The patient is tolerating medications without difficulties and is otherwise without complaint today.   Past Medical History:  Diagnosis Date   Abdominal adhesions    Abdominal gas pain    suspected -- may be related to intermittent right upper quadrant discomfort radiating around her back       Anemia    Anginal pain (HCC)    Arthritis    Atrial fibrillation (HCC)    Breast cancer (HCC)    Breast mass    left breast   Cancer (HCC)    left breast   Chest pain    intermittent right upper quadrant discomfort radiating around her back       Complication of  anesthesia    Diabetes mellitus without complication (HCC)    Type II   Dyspepsia    Dyspnea    GERD (gastroesophageal reflux disease)    H/O: hysterectomy 1978   History of cardioversion    Hypercholesteremia    Hypothyroidism    Mitral insufficiency    Personal history of radiation therapy    Pneumonia    PONV (postoperative nausea and vomiting)    Right bundle branch block (RBBB)    Tendinitis    of the right arm and shoulder   Torn tendon    right shoulder   Past Surgical History:  Procedure Laterality Date   ABDOMINAL HYSTERECTOMY     APPENDECTOMY  1978   BLADDER REPAIR     BREAST LUMPECTOMY Left 02/24/2011   central partial mastectomy - dr Margot Chimes   BREAST REDUCTION SURGERY     CARDIOVERSION N/A 10/28/2017   Procedure: CARDIOVERSION;  Surgeon: Lelon Perla, MD;  Location: [facility];  Service: Cardiovascular;  Laterality: N/A;   CARDIOVERSION N/A 04/06/2019   Procedure: CARDIOVERSION;  Surgeon: [name], MD;  Location: [facility];  Service: Cardiovascular;  Laterality: N/A;   CARDIOVERSION N/A 09/08/2019   Procedure: CARDIOVERSION;  Surgeon: Dorothy Spark, MD;  Location: [facility];  Service: Cardiovascular;  Laterality: N/A;   COLONOSCOPY WITH PROPOFOL N/A 03/24/2017   Procedure: COLONOSCOPY WITH PROPOFOL;  Surgeon: Wilford Corner, MD;  [facility];  Service: Endoscopy;  Laterality: N/A;   ESOPHAGOGASTRODUODENOSCOPY (EGD) WITH PROPOFOL N/A 03/24/2017   Procedure: ESOPHAGOGASTRODUODENOSCOPY (EGD) WITH PROPOFOL;  Surgeon: Wilford Corner, MD;  Location: [facility];  Service: Endoscopy;  Laterality: N/A;   LIPOMA EXCISION Right    REDUCTION MAMMAPLASTY Bilateral 1998   RETINAL DETACHMENT SURGERY Left    SALPINGOOPHORECTOMY     TEE WITHOUT CARDIOVERSION N/A 10/28/2017   Procedure: TRANSESOPHAGEAL ECHOCARDIOGRAM (TEE);  Surgeon: Lelon Perla, MD;  Location[facility] ENDOSCOPY;  Service: Cardiovascular;  Laterality: N/A;    [place]   at 88 years of age    No current facility-administered medications for this encounter.    Allergies  Allergen Reactions   Bactrim Nausea And Vomiting   Dilaudid [Hydromorphone Hcl] Other (See Comments)    Unknown   Hydrocodone Nausea And Vomiting   Invokana [Canagliflozin] Itching and Other (See Comments)    Yeast Infections    Macrodantin Nausea And Vomiting    Social History   Socioeconomic History   Marital status: Widowed    Spouse name: Not on file   Number of children: Not on file   Years of education: Not on file   Highest education level: Not on  file  Occupational History   Not on file  Tobacco Use   Smoking status: Never   Smokeless tobacco: Never  Vaping Use   Vaping Use: Never used  Substance and Sexual Activity   Alcohol use: No   Drug use: No   Sexual activity: Not on file  Other Topics Concern   Not on file  Social History Narrative   Not on file   Social Determinants of Health   Financial Resource Strain: Low Risk    Difficulty of Paying Living Expenses: Not hard at all  Food Insecurity: No Food Insecurity   Worried About Charity fundraiser in the Last Year: Never true   [place] in the Last Year: Never true  Transportation Needs: No Transportation Needs   Lack of Transportation (Medical): No   Lack of Transportation (Non-Medical): No  Physical Activity: Inactive   Days of Exercise per Week: 0 days   Minutes of Exercise per Session: 0 min  Stress: No Stress Concern Present   Feeling of Stress : Only a little  Social Connections: Moderately Isolated   Frequency of Communication with Friends and Family: More than three times a week   Frequency of Social Gatherings with Friends and Family: Once a week   Attends Religious Services: Never   Marine scientist or Organizations: No   Attends Archivist Meetings: Never   Marital Status: Married  Human resources officer Violence: Not At Risk    Fear of Current or Ex-Partner: No   Emotionally Abused: No   Physically Abused: No   Sexually Abused: No    Family History  Problem Relation Age of Onset   Pneumonia Father    Heart attack Mother    Hypertension Mother    Stroke Mother    Diabetes Mother    Aortic aneurysm Mother        abdominal aortic aneurysm   Cancer Mother        bladder   Aortic aneurysm Sister        abdominal aortic aneurysm    ROS- All systems are reviewed and negative except as per the HPI above  Physical Exam: Vitals:   08/26/21 1412  BP: (!) 114/54  Pulse: 82  Resp: 14  Temp: 97.7 F (36.5 C)  TempSrc: Oral  SpO2: 100%   Wt Readings from Last 3 Encounters:  08/26/21 56.8 kg  07/22/21 58.2 kg  06/19/21 61 kg    Labs: Lab Results  Component Value Date   NA 140 08/26/2021   K 3.9 08/26/2021   CL 104 08/26/2021   CO2 28 08/26/2021   GLUCOSE 266 (H) 08/26/2021   BUN 16 08/26/2021   CREATININE 0.98 08/26/2021   CALCIUM 9.7 08/26/2021   MG 2.0 08/26/2021   Lab Results  Component Value Date   INR 1.2 10/12/2017   Lab Results  Component Value Date   CHOL 94 06/23/2019   HDL 35.20 (L) 06/23/2019   LDLCALC 45 06/23/2019   TRIG 70.0 06/23/2019     GEN- The patient is well appearing, alert and oriented x 3 today.   Head- normocephalic, atraumatic Eyes-  Sclera clear, conjunctiva pink Ears- hearing intact Oropharynx- clear Neck- supple, no JVP Lymph- no cervical lymphadenopathy Lungs- Clear to ausculation bilaterally, normal work of breathing Heart- irregular rate and rhythm, no murmurs, rubs or gallops, PMI not laterally displaced GI- soft, NT, ND, + BS Extremities- no clubbing, cyanosis, or edema MS- no significant deformity or  atrophy Skin- no rash or lesion Psych- euthymic mood, full affect Neuro- strength and sensation are intact  EKG-afib at 89  bpm, qrs int 138 ms, qtc 484  ms( RBBB contributing )  In SR in March and August  qtc was 503 and 461 ms   left  ventricle has normal function. The left ventricle has no regional  wall motion abnormalities. Left ventricular diastolic parameters are  consistent with Grade II diastolic  dysfunction (pseudonormalization).   2. Right ventricular systolic function is mildly reduced. The right  ventricular size is mildly enlarged. There is normal pulmonary artery  systolic pressure.   3. Right atrial size was severely dilated.   4. The mitral valve is normal in structure. Trivial mitral valve  regurgitation.   5. Tricuspid valve regurgitation is moderate.   6. The aortic valve is tricuspid. There is mild thickening of the aortic  valve. Aortic valve regurgitation is not visualized. No aortic stenosis is  present.   7. Aortic dilatation noted. There is mild dilatation of the aortic root,  measuring 36 mm. There is mild dilatation of the ascending aorta,  measuring 37 mm.   8. The inferior vena cava is dilated in size with <50% respiratory  variability, suggesting right atrial pressure of 15 mmHg.   Assessment and Plan:  1. Persistent symptomatic rate controlled afib  Pt has failed flecainide and amiodarone  She is her for tikosyn admit, referred by Dr. Martinique  Tikosyn is her only ADD option although not ideal, as her crcl cal is only 34 ml/min so it means that she would qualify for the lowest dose of tikosyn, 125 mcg, so no wiggle  room if dose has to be reduced Also her qtc is questionable, 484 ms, today, in afib and running 460 to 503 in SR, with  RBBB is contributing, so that has to be taken into consideration  She also has RT sided failure which also may be contributing to her LLE and her shortness of breath, so unclear how much her symptoms will improve with return of SR  She did stop trazodone for sleep, many weeks ago, to be able to start Tikosyn,  now on Ambien, it  does not help her sleep, makes her unsteady on feet and she fell Friday night after taking  and did hit her head Continue metoprolol  and torsemide at current doses, weight/LEE is stable   2. CHA2DS2VASc  score of 4 States has not missed any eliquis 5 mg bid with possible tikosyn admit   for many weeks now   Pt presents to [facility] for tikosyn admission without change to above.   Legrand Como [address]" Jones, PA-C  08/26/2021 2:28 PM

## 2021-08-26 NOTE — Progress Notes (Addendum)
Pharmacy: Dofetilide (Tikosyn) - Initial Consult Assessment and Electrolyte Replacement  Pharmacy consulted to assist in monitoring and replacing electrolytes in this 88 y.o. female admitted on 08/26/2021 undergoing dofetilide initiation. First dofetilide dose: 2/21 @20 :00  Assessment:  Patient Exclusion Criteria: If any screening criteria checked as "Yes", then  patient  should NOT receive dofetilide until criteria item is corrected.  If Yes please indicate correction plan.  YES  NO Patient  Exclusion Criteria Correction Plan   [x]   []   Baseline QTc interval is greater than or equal to 440 msec. IF above YES box checked dofetilide contraindicated unless patient has ICD; then may proceed if QTc 500-550 msec or with known ventricular conduction abnormalities may proceed with QTc 550-600 msec. QTc =  484 msec   EP aware and OK to proceed - will monitor closely.   []   [x]   Patient is known or suspected to have a digoxin level greater than 2 ng/ml: No results found for: DIGOXIN     []   [x]   Creatinine clearance less than 20 ml/min (calculated using Cockcroft-Gault, actual body weight and serum creatinine): Estimated Creatinine Clearance: 29.4 mL/min (by C-G formula based on SCr of 0.98 mg/dL).     []   [x]  Patient has received drugs known to prolong the QT intervals within the last 48 hours (phenothiazines, tricyclics or tetracyclic antidepressants, erythromycin, H-1 antihistamines, cisapride, fluoroquinolones, azithromycin, ondansetron).   Updated information on QT prolonging agents is available to be searched on the following database:QT prolonging agents     []   [x]   Patient received a dose of hydrochlorothiazide (Oretic) alone or in any combination including triamterene (Dyazide, Maxzide) in the last 48 hours.    []   [x]  Patient received a medication known to increase dofetilide plasma concentrations prior to initial dofetilide dose:  Trimethoprim (Primsol, Proloprim) in the  last 36 hours Verapamil (Calan, Verelan) in the last 36 hours or a sustained release dose in the last 72 hours Megestrol (Megace) in the last 5 days  Cimetidine (Tagamet) in the last 6 hours Ketoconazole (Nizoral) in the last 24 hours Itraconazole (Sporanox) in the last 48 hours  Prochlorperazine (Compazine) in the last 36 hours     []   [x]   Patient is known to have a history of torsades de pointes; congenital or acquired long QT syndromes.    []   [x]   Patient has received a Class 1 antiarrhythmic with less than 2 half-lives since last dose. (Disopyramide, Quinidine, Procainamide, Lidocaine, Mexiletine, Flecainide, Propafenone)    []   [x]   Patient has received amiodarone therapy in the past 3 months or amiodarone level is greater than 0.3 ng/ml.    Patient has been appropriately anticoagulated with Eliquis.  Labs:    Component Value Date/Time   K 3.9 08/26/2021 1135   K 4.1 04/03/2014 1026   MG 2.0 08/26/2021 1135     Plan: Potassium: K 3.8-3.9:  Hold Tikosyn initiation and give KCl 40 mEq po x1 then begin Tikosyn at least 2hr after KCl dose - do not need to recheck K   Magnesium: Mg 1.8-2: Give Mg 2 gm IV x1 to prevent Mg from dropping below 1.8 - do not need to recheck Mg. Appropriate to initiate Tikosyn   Thank you for allowing pharmacy to participate in this patient's care   Laurey Arrow, PharmD PGY1 Pharmacy Resident 08/26/2021  3:40 PM  Please check [email or web address] for unit-specific pharmacy phone numbers.

## 2021-08-27 LAB — BASIC METABOLIC PANEL
Anion gap: 9 (ref 5–15)
BUN: 16 mg/dL (ref 8–23)
CO2: 26 mmol/L (ref 22–32)
Calcium: 9.3 mg/dL (ref 8.9–10.3)
Chloride: 104 mmol/L (ref 98–111)
Creatinine, Ser: 0.87 mg/dL (ref 0.44–1.00)
GFR, Estimated: >60 mL/min (ref >60)
Glucose, Bld: 106 mg/dL — ABNORMAL HIGH (ref 70–99)
Potassium: 3.6 mmol/L (ref 3.5–5.1)
Sodium: 139 mmol/L (ref 135–145)

## 2021-08-27 LAB — PROTIME-INR
INR: 1.7 — ABNORMAL HIGH (ref 0.8–1.2)
Prothrombin Time: 20.2 seconds — ABNORMAL HIGH (ref 11.4–15.2)

## 2021-08-27 LAB — GLUCOSE, CAPILLARY
Glucose-Capillary: 96 mg/dL (ref 70–99)
Glucose-Capillary: 195 mg/dL — ABNORMAL HIGH (ref 70–99)
Glucose-Capillary: 265 mg/dL — ABNORMAL HIGH (ref 70–99)
Glucose-Capillary: 157 mg/dL — ABNORMAL HIGH (ref 70–99)

## 2021-08-27 LAB — MAGNESIUM: Magnesium: 2.3 mg/dL (ref 1.7–2.4)

## 2021-08-27 MED ORDER — INSULIN ASPART 100 UNIT/ML IJ SOLN
0.0000 [IU] | Freq: Three times a day (TID) | INTRAMUSCULAR | Status: DC
  Administered 2021-08-27: 5 [IU] via SUBCUTANEOUS
  Administered 2021-08-28: 3 [IU] via SUBCUTANEOUS
  Administered 2021-08-28: 1 [IU] via SUBCUTANEOUS
  Administered 2021-08-29: 3 [IU] via SUBCUTANEOUS

## 2021-08-27 MED ORDER — SODIUM CHLORIDE 0.9 % IV SOLN: INTRAVENOUS | Status: DC

## 2021-08-27 MED ORDER — POTASSIUM CHLORIDE CRYS ER 20 MEQ PO TBCR
60.0000 meq | EXTENDED_RELEASE_TABLET | Freq: Once | ORAL | Status: AC
  Administered 2021-08-27: 60 meq via ORAL
  Filled 2021-08-27: qty 3

## 2021-08-27 MED ORDER — INSULIN GLARGINE-YFGN 100 UNIT/ML ~~LOC~~ SOLN
14.0000 [IU] | Freq: Every day | SUBCUTANEOUS | Status: DC
  Administered 2021-08-27 – 2021-08-28 (×2): 14 [IU] via SUBCUTANEOUS
  Filled 2021-08-27 (×3): qty 0.14

## 2021-08-27 NOTE — Progress Notes (Signed)
Contacted pharmacy, still waiting for Tikosyn dose to come to floor.

## 2021-08-27 NOTE — Progress Notes (Addendum)
Inpatient Diabetes Program Recommendations  AACE/ADA: New Consensus Statement on Inpatient Glycemic Control (2015)  Target Ranges:  Prepandial:   less than 140 mg/dL      Peak postprandial:   less than 180 mg/dL (1-2 hours)      Critically ill patients:  140 - 180 mg/dL   Lab Results  Component Value Date   GLUCAP 96 08/27/2021   HGBA1C 10.5 (H) 01/21/2021    Review of Glycemic Control  Latest Reference Range & Units 08/26/21 16:31 08/26/21 20:43 08/27/21 06:56  Glucose-Capillary 70 - 99 mg/dL 247 (H) 312 (H) Semglee 24 units 96  (H): Data is abnormally high  Diabetes history: DM2 Outpatient Diabetes medications:  Tresiba 14-18 units QHS, Metformin XR 500 mg daily Current orders for Inpatient glycemic control:  Semglee 24 units QHS, Metformin XR 500 mg QAM  Inpatient Diabetes Program Recommendations:    Please consider:  1-Semglee 14 units QHS 2-Novolog 0-9 units TID 3-DC Metformin while inpatient 4-Add carb modified to diet 5-Obtain current A1C  Spoke with patient at bedside.  She states she takes above home medications for her DM2.  She states she usually takes 18 units but will give more or less depending on CBG.  She feels best when her blood sugar is 140-150 mg/dL which is appropriate given her age.  She checks her blood sugar QAM.  She is current with her PCP.    She states she feels low this morning with her blood sugar being 96 mg/dL.    She lost her husband of 33 years in August.  She states she has "cheated" since then with her diet.  Encouraged her to keep close follow up with her PCP.  Will continue to follow while inpatient.  Thank you, Julie Dixon, MSN, RN Diabetes Coordinator Inpatient Diabetes Program [phone] (team pager from 8a-5p)

## 2021-08-27 NOTE — Progress Notes (Signed)
Pharmacy: Dofetilide (Tikosyn) - Follow Up Assessment and Electrolyte Replacement  Pharmacy consulted to assist in monitoring and replacing electrolytes in this 88 y.o. female admitted on 08/26/2021 undergoing dofetilide initiation. First dofetilide dose: 2/22@2102   Labs:    Component Value Date/Time   K 3.6 08/27/2021 0247   K 4.1 04/03/2014 1026   MG 2.3 08/27/2021 0247     Plan: Potassium: K 3.5-3.7:  Give KCl 60 mEq po x1   Magnesium: Mg > 2: No additional supplementation needed  Thank you for allowing pharmacy to participate in this patient's care   Antonietta Jewel, PharmD, [place] Pharmacist  Phone: [phone] 08/27/2021 7:30 AM  Please check AMION for all [facility] phone numbers After 10:00 PM, call [place] [phone]

## 2021-08-27 NOTE — Care Management (Addendum)
08-27-21 Case Manager spoke with patient regarding Tikosyn cost. Patient is agreeable to cost and wants the initial Rx sent to [facility] and [facility]. No further needs from Case Manager at this time.   1251 08-27-21 Patient had further questions regarding Tikosyn and Good Rx. Patient wants the initial Rx filled with [facility] and now wants to use the Good RX for refills 90 day supply to be sent to [facility]. Case Manager called the daughter to update. Daughter will download the APP to follow the pharmacies cost. No further needs from Case Manager at this time.

## 2021-08-27 NOTE — Progress Notes (Addendum)
Electrophysiology Rounding Note  Patient Name: Julie Norton Date of Encounter: 08/27/2021  Primary Cardiologist: Peter Martinique, MD  Electrophysiologist: None    Subjective   Pt remains in afib on Tikosyn 125 mcg BID   QTc from EKG last pm shows stable QTc when accounted for her RBBB.  The patient is doing well today.  At this time, the patient denies chest pain, shortness of breath, or any new concerns.  Inpatient Medications    Scheduled Meds:  apixaban  5 mg Oral BID   dofetilide  125 mcg Oral BID   ferrous sulfate  325 mg Oral Q breakfast   insulin glargine-yfgn  24 Units Subcutaneous QHS   levothyroxine  50 mcg Oral Q0600   metFORMIN  500 mg Oral Q breakfast   metoprolol succinate  25 mg Oral q morning   potassium chloride  10 mEq Oral TID   potassium chloride  60 mEq Oral Once   sodium chloride flush  3 mL Intravenous Q12H   torsemide  20 mg Oral BID   vitamin B-12  1,000 mcg Oral Daily   Continuous Infusions:  sodium chloride     PRN Meds: sodium chloride, acetaminophen, sodium chloride flush   Vital Signs    Vitals:   08/26/21 1412 08/26/21 2046 08/26/21 2107 08/27/21 0604  BP: (!) 114/54 (!) 94/45 (!) 102/50 (!) 95/50  Pulse: 82 74    Resp: 14 16  18   Temp: 97.7 F (36.5 C) 97.6 F (36.4 C)  (!) 97.2 F (36.2 C)  TempSrc: Oral Oral  Oral  SpO2: 100% 98%  97%  Weight: 56.8 kg     Height: 5\' 1"  (1.549 m)       Intake/Output Summary (Last 24 hours) at 08/27/2021 0745 Last data filed at 08/26/2021 2100 Gross per 24 hour  Intake 501.68 ml  Output --  Net 501.68 ml   Filed Weights   08/26/21 1412  Weight: 56.8 kg    Physical Exam    GEN- The patient is well appearing, alert and oriented x 3 today.   Head- normocephalic, atraumatic Eyes-  Sclera clear, conjunctiva pink Ears- hearing intact Oropharynx- clear Neck- supple Lungs- Clear to ausculation bilaterally, normal work of breathing Heart- Irregularly irregular rate and rhythm, no  murmurs, rubs or gallops GI- soft, NT, ND, + BS Extremities- no clubbing, cyanosis, or edema Skin- no rash or lesion Psych- euthymic mood, full affect Neuro- strength and sensation are intact  Labs    CBC No results for input(s): WBC, NEUTROABS, HGB, HCT, MCV, PLT in the last 72 hours. Basic Metabolic Panel Recent Labs    08/26/21 1135 08/27/21 0247  NA 140 139  K 3.9 3.6  CL 104 104  CO2 28 26  GLUCOSE 266* 106*  BUN 16 16  CREATININE 0.98 0.87  CALCIUM 9.7 9.3  MG 2.0 2.3    Potassium  Date/Time Value Ref Range Status  08/27/2021 02:47 AM 3.6 3.5 - 5.1 mmol/L Final  04/03/2014 10:26 AM 4.1 3.5 - 5.1 mEq/L Final   Magnesium  Date/Time Value Ref Range Status  08/27/2021 02:47 AM 2.3 1.7 - 2.4 mg/dL Final    Comment:    Performed at [facility], [place] [address], [place], [place] [address]    Telemetry    AF 80-90s (personally reviewed)  Radiology    No results found.   Patient Profile     Julie Norton is a 88 y.o. female with a past  medical history significant for persistent atrial fibrillation.  They were admitted for tikosyn load.   Assessment & Plan    Persistent atrial fibrillation Pt remains in afib on Tikosyn 125 mcg BID  Continue Eliquis K 3.6. Supp ordered. Mg stable. CHA2DS2VASC is at least 5.   If pt does not convert chemically, plan on DCCV tomorrow   She has failed flecainide and amiodarone. If she fails tikosyn, Jaydee Ingman need to consider rate control alone. If unobtainable, may ultimately need AVJ/PPM.      For questions or updates, please contact [place] Please consult [email or web address] for contact info under Cardiology/STEMI.  Signed, Shirley Friar, PA-C  08/27/2021, 7:45 AM   I have seen and examined this patient with Oda Kilts.  Agree with above, note added to reflect my findings.  Patient doing well.  No chest pain or shortness of breath.  Ready to get back into normal rhythm.  GEN: Well nourished, well  developed, in no acute distress  HEENT: normal  Neck: no JVD, carotid bruits, or masses Cardiac: Irregular; no murmurs, rubs, or gallops,no edema  Respiratory:  clear to auscultation bilaterally, normal work of breathing GI: soft, nontender, nondistended, + BS MS: no deformity or atrophy  Skin: warm and dry Neuro:  Strength and sensation are intact Psych: euthymic mood, full affect   Persistent atrial fibrillation: Currently on dofetilide 125 mcg twice daily.  She remains in atrial fibrillation.  If she does not convert on her own, we Deeya Richeson plan for cardioversion tomorrow.  Aurelie Dicenzo M. Bradely Rudin MD 08/27/2021 10:53 AM

## 2021-08-27 NOTE — Progress Notes (Signed)
Morning EKG reviewed    Shows pt remains in afib at 68 bpm with borderline QTc at ~480-490 ms when corrected for RBBB.  Continue  Tikosyn 125 mcg BID.   Pt will be NPO after midnight for DCCV if remains in [place], [place]  Pager: [phone]  08/27/2021 8:13 PM

## 2021-08-28 ENCOUNTER — Encounter (HOSPITAL_COMMUNITY): Payer: Self-pay | Admitting: Cardiology

## 2021-08-28 ENCOUNTER — Inpatient Hospital Stay (HOSPITAL_COMMUNITY): Payer: Medicare Other | Admitting: Anesthesiology

## 2021-08-28 ENCOUNTER — Encounter (HOSPITAL_COMMUNITY)
Admission: AD | Disposition: A | Discharge status: Home / Self Care | Payer: Self-pay | Source: Ambulatory Visit | Attending: Cardiology

## 2021-08-28 DIAGNOSIS — D638 Anemia in other chronic diseases classified elsewhere: Secondary | ICD-10-CM

## 2021-08-28 DIAGNOSIS — E119 Type 2 diabetes mellitus without complications: Secondary | ICD-10-CM

## 2021-08-28 DIAGNOSIS — I4891 Unspecified atrial fibrillation: Secondary | ICD-10-CM

## 2021-08-28 DIAGNOSIS — E039 Hypothyroidism, unspecified: Secondary | ICD-10-CM

## 2021-08-28 HISTORY — PX: CARDIOVERSION: SHX1299

## 2021-08-28 LAB — BASIC METABOLIC PANEL
Anion gap: 8 (ref 5–15)
BUN: 17 mg/dL (ref 8–23)
CO2: 27 mmol/L (ref 22–32)
Calcium: 9.7 mg/dL (ref 8.9–10.3)
Chloride: 105 mmol/L (ref 98–111)
Creatinine, Ser: 0.99 mg/dL (ref 0.44–1.00)
GFR, Estimated: 55 mL/min — ABNORMAL LOW (ref >60)
Glucose, Bld: 119 mg/dL — ABNORMAL HIGH (ref 70–99)
Potassium: 4.1 mmol/L (ref 3.5–5.1)
Sodium: 140 mmol/L (ref 135–145)

## 2021-08-28 LAB — GLUCOSE, CAPILLARY
Glucose-Capillary: 125 mg/dL — ABNORMAL HIGH (ref 70–99)
Glucose-Capillary: 86 mg/dL (ref 70–99)
Glucose-Capillary: 226 mg/dL — ABNORMAL HIGH (ref 70–99)
Glucose-Capillary: 286 mg/dL — ABNORMAL HIGH (ref 70–99)

## 2021-08-28 LAB — MAGNESIUM: Magnesium: 1.9 mg/dL (ref 1.7–2.4)

## 2021-08-28 SURGERY — CARDIOVERSION
Anesthesia: General

## 2021-08-28 MED ORDER — PROPOFOL 10 MG/ML IV BOLUS
INTRAVENOUS | Status: DC | PRN
  Administered 2021-08-28: 45 mg via INTRAVENOUS

## 2021-08-28 MED ORDER — MAGNESIUM SULFATE 2 GM/50ML IV SOLN
2.0000 g | Freq: Once | INTRAVENOUS | Status: AC
  Administered 2021-08-28: 2 g via INTRAVENOUS
  Filled 2021-08-28: qty 50

## 2021-08-28 MED ORDER — PHENYLEPHRINE 40 MCG/ML (10ML) SYRINGE FOR IV PUSH (FOR BLOOD PRESSURE SUPPORT)
PREFILLED_SYRINGE | INTRAVENOUS | Status: DC | PRN
  Administered 2021-08-28: 80 ug via INTRAVENOUS

## 2021-08-28 MED ORDER — LIDOCAINE 2% (20 MG/ML) 5 ML SYRINGE
INTRAMUSCULAR | Status: DC | PRN
Start: 2021-08-28 — End: 2021-08-28
  Administered 2021-08-28: 100 mg via INTRAVENOUS

## 2021-08-28 NOTE — Transfer of Care (Signed)
Immediate Anesthesia Transfer of Care Note  Patient: Julie Norton  Procedure(s) Performed: CARDIOVERSION  Patient Location: Short Stay  Anesthesia Type:General  Level of Consciousness: drowsy  Airway & Oxygen Therapy: Patient Spontanous Breathing  Post-op Assessment: Report given to RN and Post -op Vital signs reviewed and stable  Post vital signs: Reviewed and stable  Last Vitals:  Vitals Value Taken Time  BP 107/49 08/28/21 1322  Temp    Pulse 57 08/28/21 1322  Resp 16 08/28/21 1322  SpO2 99 % 08/28/21 1322    Last Pain:  Vitals:   08/28/21 1248  TempSrc: Temporal  PainSc: 0-No pain      Patients Stated Pain Goal: 0 (31/54/00 8676)  Complications: No notable events documented.

## 2021-08-28 NOTE — Progress Notes (Signed)
Electrophysiology Rounding Note  Patient Name: Julie Norton Date of Encounter: 08/28/2021  Primary Cardiologist: Peter Martinique, MD  Electrophysiologist: Dr. Quentin Ore   Subjective   Pt remains in afib on Tikosyn 125 mcg BID   QTc from EKG last pm shows  borderline , but stable when accounted for her very wide RBBB.  The patient is doing well today.  At this time, the patient denies chest pain, shortness of breath, or any new concerns.  Inpatient Medications    Scheduled Meds:  apixaban  5 mg Oral BID   dofetilide  125 mcg Oral BID   ferrous sulfate  325 mg Oral Q breakfast   insulin aspart  0-9 Units Subcutaneous TID WC   insulin glargine-yfgn  14 Units Subcutaneous QHS   levothyroxine  50 mcg Oral Q0600   metoprolol succinate  25 mg Oral q morning   potassium chloride  10 mEq Oral TID   sodium chloride flush  3 mL Intravenous Q12H   torsemide  20 mg Oral BID   vitamin B-12  1,000 mcg Oral Daily   Continuous Infusions:  sodium chloride     sodium chloride     magnesium sulfate bolus IVPB     PRN Meds: sodium chloride, acetaminophen, sodium chloride flush   Vital Signs    Vitals:   08/27/21 0954 08/27/21 1230 08/27/21 2112 08/28/21 0459  BP: 133/75 103/67 116/60 (!) 102/52  Pulse: 74 86 (!) 114 (!) 56  Resp:   18 15  Temp:  (!) 97.2 F (36.2 C) (!) 97.4 F (36.3 C) 97.7 F (36.5 C)  TempSrc:  Axillary Oral Oral  SpO2:  98% 99% 99%  Weight:      Height:        Intake/Output Summary (Last 24 hours) at 08/28/2021 0829 Last data filed at 08/27/2021 2100 Gross per 24 hour  Intake 240 ml  Output --  Net 240 ml   Filed Weights   08/26/21 1412  Weight: 56.8 kg    Physical Exam    GEN- The patient is well appearing, alert and oriented x 3 today.   Head- normocephalic, atraumatic Eyes-  Sclera clear, conjunctiva pink Ears- hearing intact Oropharynx- clear Neck- supple Lungs- Clear to ausculation bilaterally, normal work of breathing Heart-  Irregularly irregular rate and rhythm, no murmurs, rubs or gallops GI- soft, NT, ND, + BS Extremities- no clubbing, cyanosis, or edema Skin- no rash or lesion Psych- euthymic mood, full affect Neuro- strength and sensation are intact  Labs    CBC No results for input(s): WBC, NEUTROABS, HGB, HCT, MCV, PLT in the last 72 hours. Basic Metabolic Panel Recent Labs    08/27/21 0247 08/28/21 0303  NA 139 140  K 3.6 4.1  CL 104 105  CO2 26 27  GLUCOSE 106* 119*  BUN 16 17  CREATININE 0.87 0.99  CALCIUM 9.3 9.7  MG 2.3 1.9    Potassium  Date/Time Value Ref Range Status  08/28/2021 03:03 AM 4.1 3.5 - 5.1 mmol/L Final  04/03/2014 10:26 AM 4.1 3.5 - 5.1 mEq/L Final   Magnesium  Date/Time Value Ref Range Status  08/28/2021 03:03 AM 1.9 1.7 - 2.4 mg/dL Final    Comment:    Performed at [facility], [place] [address] [place], [place] [address]    Telemetry    AF variable rates 70-110s (personally reviewed)  Radiology    No results found.   Patient Profile     Julie Norton is a 88 y.o. female with a past medical history significant for persistent atrial fibrillation.  They were admitted for tikosyn load.   Assessment & Plan    Persistent atrial fibrillation Pt remains in afib on Tikosyn 125 mcg BID  Continue Eliquis K 4.1, Mg 1.9. Supp Mg.  CHA2DS2VASC is at least 5.   If pt does not convert chemically, plan on DCCV this afternoon.    For questions or updates, please contact [place] Please consult [email or web address] for contact info under Cardiology/STEMI.  Signed, Shirley Friar, PA-C  08/28/2021, 8:29 AM

## 2021-08-28 NOTE — Anesthesia Procedure Notes (Signed)
Procedure Name: MAC Date/Time: 08/28/2021 12:44 PM Performed by: Erick Colace, CRNA Pre-anesthesia Checklist: Patient identified, Emergency Drugs available, Suction available, Patient being monitored and Timeout performed Patient Re-evaluated:Patient Re-evaluated prior to induction Oxygen Delivery Method: Ambu bag Preoxygenation: Pre-oxygenation with 100% oxygen Induction Type: IV induction

## 2021-08-28 NOTE — Progress Notes (Signed)
Pharmacy: Dofetilide (Tikosyn) - Follow Up Assessment and Electrolyte Replacement  Pharmacy consulted to assist in monitoring and replacing electrolytes in this 88 y.o. female admitted on 08/26/2021 undergoing dofetilide initiation. First dofetilide dose: 2/22@2102   Labs:    Component Value Date/Time   K 4.1 08/28/2021 0303   K 4.1 04/03/2014 1026   MG 1.9 08/28/2021 0303     Plan: Potassium: K >/= 4: No additional supplementation needed  Magnesium: Mg 1.8-2: Give Mg 2 gm IV x1   Thank you for allowing pharmacy to participate in this patient's care   Antonietta Jewel, PharmD, [place] Pharmacist  Phone: [phone] 08/28/2021 7:26 AM  Please check AMION for all [facility] phone numbers After 10:00 PM, call [place] [phone]

## 2021-08-28 NOTE — Progress Notes (Signed)
Morning EKG reviewed    Shows pt remains in afib with borderline QTc, but acceptable when correcting for her very large RBBB. .  Continue  Tikosyn 125 mcg BID, for now, will review with Dr. Quentin Ore and assess further in sinus.   Plan for cardioversion this afternoon.     Shirley Friar, PA-C  Pager: [phone]  08/28/2021 11:08 AM

## 2021-08-28 NOTE — Anesthesia Preprocedure Evaluation (Addendum)
Anesthesia Evaluation  Patient identified by MRN, date of birth, ID band Patient awake    Reviewed: Allergy & Precautions, NPO status , Patient's Chart, lab work & pertinent test results  History of Anesthesia Complications (+) PONV and history of anesthetic complications  Airway Mallampati: II  TM Distance: >3 FB Neck ROM: Full    Dental no notable dental hx. (+) Edentulous Upper, Edentulous Lower   Pulmonary neg pulmonary ROS,    Pulmonary exam normal breath sounds clear to auscultation       Cardiovascular negative cardio ROS Normal cardiovascular exam+ dysrhythmias Atrial Fibrillation  Rhythm:Irregular Rate:Abnormal     Neuro/Psych  Neuromuscular disease negative psych ROS   GI/Hepatic Neg liver ROS, GERD  ,  Endo/Other  diabetes, Type 2Hypothyroidism   Renal/GU      Musculoskeletal   Abdominal   Peds  Hematology negative hematology ROS (+) Blood dyscrasia, anemia ,   Anesthesia Other Findings   Reproductive/Obstetrics                            Anesthesia Physical  Anesthesia Plan  ASA: 3  Anesthesia Plan: General   Post-op Pain Management: Minimal or no pain anticipated   Induction: Intravenous  PONV Risk Score and Plan: 4 or greater and Treatment may vary due to age or medical condition and Propofol infusion  Airway Management Planned: Natural Airway and Nasal Cannula  Additional Equipment: None  Intra-op Plan:   Post-operative Plan:   Informed Consent: I have reviewed the patients History and Physical, chart, labs and discussed the procedure including the risks, benefits and alternatives for the proposed anesthesia with the patient or authorized representative who has indicated his/her understanding and acceptance.     Dental advisory given  Plan Discussed with: CRNA and Anesthesiologist  Anesthesia Plan Comments:        Anesthesia Quick Evaluation

## 2021-08-28 NOTE — CV Procedure (Signed)
Procedure:   DCCV  Indication:  Symptomatic atrial fibrillation  Procedure Note:  The patient signed informed consent.  They have had had therapeutic anticoagulation with apixaban greater than 3 weeks.  Anesthesia was administered by Dr. Ambrose Pancoast.  Patient received 100 mg IV lidocaine and 45 mg IV propofol.Adequate airway was maintained throughout and vital followed per protocol.  They were cardioverted x 1 with 120J of biphasic synchronized energy.  They converted to sinus bradycardia.  There were no apparent complications.  The patient had normal neuro status and respiratory status post procedure with vitals stable as recorded elsewhere.    Follow up:  They will continue on current medical therapy and follow up with cardiology as scheduled.  [name], MD PhD 08/28/2021 1:27 PM

## 2021-08-29 ENCOUNTER — Encounter (HOSPITAL_COMMUNITY): Payer: Self-pay | Admitting: Cardiology

## 2021-08-29 ENCOUNTER — Other Ambulatory Visit (HOSPITAL_COMMUNITY): Payer: Self-pay

## 2021-08-29 LAB — BASIC METABOLIC PANEL
Anion gap: 8 (ref 5–15)
BUN: 17 mg/dL (ref 8–23)
CO2: 25 mmol/L (ref 22–32)
Calcium: 9.6 mg/dL (ref 8.9–10.3)
Chloride: 107 mmol/L (ref 98–111)
Creatinine, Ser: 1 mg/dL (ref 0.44–1.00)
GFR, Estimated: 54 mL/min — ABNORMAL LOW (ref >60)
Glucose, Bld: 86 mg/dL (ref 70–99)
Potassium: 3.7 mmol/L (ref 3.5–5.1)
Sodium: 140 mmol/L (ref 135–145)

## 2021-08-29 LAB — MAGNESIUM: Magnesium: 2.2 mg/dL (ref 1.7–2.4)

## 2021-08-29 LAB — GLUCOSE, CAPILLARY
Glucose-Capillary: 94 mg/dL (ref 70–99)
Glucose-Capillary: 203 mg/dL — ABNORMAL HIGH (ref 70–99)

## 2021-08-29 LAB — HEMOGLOBIN A1C
Hgb A1c MFr Bld: 8.4 % — ABNORMAL HIGH (ref 4.8–5.6)
Mean Plasma Glucose: 194 mg/dL

## 2021-08-29 MED ORDER — DOFETILIDE 125 MCG PO CAPS
125.0000 ug | ORAL_CAPSULE | Freq: Two times a day (BID) | ORAL | 6 refills | Status: DC
  Filled 2021-08-29: qty 60, 30d supply, fill #0

## 2021-08-29 MED ORDER — POTASSIUM CHLORIDE CRYS ER 20 MEQ PO TBCR
60.0000 meq | EXTENDED_RELEASE_TABLET | Freq: Once | ORAL | Status: AC
  Administered 2021-08-29: 60 meq via ORAL
  Filled 2021-08-29: qty 3

## 2021-08-29 MED ORDER — POTASSIUM CHLORIDE CRYS ER 10 MEQ PO TBCR: 10.0000 meq | EXTENDED_RELEASE_TABLET | Freq: Three times a day (TID) | ORAL | Status: DC

## 2021-08-29 NOTE — Care Management Important Message (Signed)
Important Message  Patient Details  Name: Julie Norton MRN: [id number] Date of Birth: Jul 26, 1929   Medicare Important Message Given:  Yes     Orbie Pyo 08/29/2021, 3:22 PM

## 2021-08-29 NOTE — Progress Notes (Signed)
EKG from yesterday evening 08/28/21 reviewed    Shows remains in NSR at 66 bpm with stable QTc when corrected for her RBBB.  Continue  Tikosyn 125 mcg BID.   Home this afternoon if QTc remains stable.    K 3.7, supp in. Mg 2.2.  Full note pending disposition.   Shirley Friar, PA-C  Pager: [phone]  08/29/2021 8:04 AM

## 2021-08-29 NOTE — Progress Notes (Signed)
Inpatient Diabetes Program Recommendations  AACE/ADA: New Consensus Statement on Inpatient Glycemic Control (2015)  Target Ranges:  Prepandial:   less than 140 mg/dL      Peak postprandial:   less than 180 mg/dL (1-2 hours)      Critically ill patients:  140 - 180 mg/dL   Lab Results  Component Value Date   GLUCAP 94 08/29/2021   HGBA1C 8.4 (H) 08/28/2021    Review of Glycemic Control  Latest Reference Range & Units 08/28/21 08:26 08/28/21 11:34 08/28/21 16:13 08/28/21 20:22 08/29/21 08:01  Glucose-Capillary 70 - 99 mg/dL 125 (H) 86 226 (H) 286 (H) 94  (H): Data is abnormally high  Diabetes history: DM2 Outpatient Diabetes medications:  Tresiba 14-18 units QHS, Metformin XR 500 mg daily Current orders for Inpatient glycemic control:  Semglee 14 units QHS, Metformin XR 500 mg QAM  Inpatient Diabetes Program Recommendations:    Please consider,  Novolog 2 units TID with meals if eats at least 50%.  Will continue to follow while inpatient.  Thank you, Reche Dixon, MSN, RN Diabetes Coordinator Inpatient Diabetes Program [phone] (team pager from 8a-5p)

## 2021-08-29 NOTE — Anesthesia Postprocedure Evaluation (Signed)
Anesthesia Post Note  Patient: Julie Norton  Procedure(s) Performed: CARDIOVERSION     Patient location during evaluation: PACU Anesthesia Type: General Level of consciousness: awake and alert Pain management: pain level controlled Vital Signs Assessment: post-procedure vital signs reviewed and stable Respiratory status: spontaneous breathing, nonlabored ventilation, respiratory function stable and patient connected to nasal cannula oxygen Cardiovascular status: blood pressure returned to baseline and stable Postop Assessment: no apparent nausea or vomiting Anesthetic complications: no   No notable events documented.  Last Vitals:  Vitals:   08/28/21 1419 08/28/21 1948  BP: (!) 114/51 125/64  Pulse: 61 69  Resp: 20 16  Temp: (!) 36.3 C 36.5 C  SpO2: 99% 95%    Last Pain:  Vitals:   08/28/21 2310  TempSrc:   PainSc: Asleep                 Mayes Sangiovanni

## 2021-08-29 NOTE — Discharge Summary (Addendum)
ELECTROPHYSIOLOGY PROCEDURE DISCHARGE SUMMARY    Patient ID: Julie Norton,  MRN: [id number], DOB/AGE: [birth year] 88 y.o.  Admit date: 08/26/2021 Discharge date: 08/29/2021  Primary Care Physician: Eulas Post, MD  Primary Cardiologist: Peter Martinique, MD  Electrophysiologist: None   Primary Discharge Diagnosis:  1.  Persistent atrial fibrillation status post Tikosyn loading this admission  Allergies  Allergen Reactions   Bactrim Nausea And Vomiting   Dilaudid [Hydromorphone Hcl] Nausea Only   Hydrocodone Nausea And Vomiting   Invokana [Canagliflozin] Itching and Other (See Comments)    Yeast Infections    Macrodantin Nausea And Vomiting     Procedures This Admission:  1.  Tikosyn loading 2.  Direct current cardioversion on Thursday August 28, 2021  by Dr Harrell Gave which successfully restored SR.  There were no early apparent complications.   Brief HPI: Julie Norton is a 88 y.o. female with a past medical history as noted above.  They were referred to EP in the outpatient setting for treatment options of atrial fibrillation.  Risks, benefits, and alternatives to Tikosyn were reviewed with the patient who wished to proceed.    Hospital Course:  The patient was admitted and Tikosyn was initiated.  Renal function and electrolytes were followed during the hospitalization.  Their QTc remained stable.  On 08/28/2021 they underwent direct current cardioversion which restored sinus rhythm.  They were monitored until discharge on telemetry which demonstrated NSR without QT prolongation.  On the day of discharge, they were examined by Dr. Curt Bears  who considered them stable for discharge to home.  Follow-up has been arranged with the Atrial Fibrillation clinic in approximately 1 week and with  EP APP  in 4 weeks.   Physical Exam: Vitals:   08/28/21 1355 08/28/21 1419 08/28/21 1948 08/29/21 0821  BP: (!) 111/44 (!) 114/51 125/64 (!) 116/58  Pulse: (!) 57 61 69 69   Resp: 20 20 16 18   Temp:  (!) 97.3 F (36.3 C) 97.7 F (36.5 C) 97.8 F (36.6 C)  TempSrc:  Oral Oral Oral  SpO2: 98% 99% 95% 96%  Weight:      Height:        GEN- The patient is well appearing, alert and oriented x 3 today.   HEENT: normocephalic, atraumatic; sclera clear, conjunctiva pink; hearing intact; oropharynx clear; neck supple, no JVP Lymph- no cervical lymphadenopathy Lungs- Clear to ausculation bilaterally, normal work of breathing.  No wheezes, rales, rhonchi Heart- Regular rate and rhythm, no murmurs, rubs or gallops, PMI not laterally displaced GI- soft, non-tender, non-distended, bowel sounds present, no hepatosplenomegaly Extremities- no clubbing, cyanosis, or edema; DP/PT/radial pulses 2+ bilaterally MS- no significant deformity or atrophy Skin- warm and dry, no rash or lesion Psych- euthymic mood, full affect Neuro- strength and sensation are intact   Labs:   Lab Results  Component Value Date   WBC 4.1 06/13/2021   HGB 8.4 (L) 06/13/2021   HCT 28.8 (L) 06/13/2021   MCV 71 (L) 06/13/2021   PLT 172 06/13/2021    Recent Labs  Lab 08/29/21 0329  NA 140  K 3.7  CL 107  CO2 25  BUN 17  CREATININE 1.00  CALCIUM 9.6  GLUCOSE 86     Discharge Medications:  Allergies as of 08/29/2021       Reactions   Bactrim Nausea And Vomiting   Dilaudid [hydromorphone Hcl] Nausea Only   Hydrocodone Nausea And Vomiting   Invokana [canagliflozin] Itching, Other (See Comments)  Yeast Infections   Macrodantin Nausea And Vomiting        Medication List     STOP taking these medications    zolpidem 5 MG tablet Commonly known as: AMBIEN       TAKE these medications    acetaminophen 500 MG tablet Commonly known as: TYLENOL Take 1,000 mg by mouth every 6 (six) hours as needed for mild pain or moderate pain.   CALCIUM+D3 PO Take 1 tablet by mouth daily with breakfast.   dofetilide 125 MCG capsule Commonly known as: TIKOSYN Take 1 capsule (125 mcg  total) by mouth 2 (two) times daily.   Eliquis 5 MG Tabs tablet Generic drug: apixaban TAKE ONE TABLET BY MOUTH EVERY MORNING and TAKE ONE TABLET BY MOUTH EVERY EVENING What changed: See the new instructions.   ferrous sulfate 325 (65 FE) MG EC tablet Take 325 mg daily What changed:  how much to take how to take this when to take this   Insulin Pen Needle 29G X 5MM Misc Use once daily   levothyroxine 50 MCG tablet Commonly known as: SYNTHROID TAKE ONE TABLET BY MOUTH BEFORE BREAKFAST What changed: See the new instructions.   lidocaine 5 % Commonly known as: LIDODERM Place 1 patch onto the skin See admin instructions. Apply 1 patch to the neck every day   loratadine 10 MG tablet Commonly known as: CLARITIN Take 10 mg by mouth daily as needed for allergies.   MAGNESIUM PO Take 1 tablet by mouth daily with breakfast.   metFORMIN 500 MG 24 hr tablet Commonly known as: GLUCOPHAGE-XR Take 1 tablet (500 mg total) by mouth daily.   metoprolol succinate 25 MG 24 hr tablet Commonly known as: TOPROL-XL TAKE ONE TABLET BY MOUTH EVERY MORNING What changed: when to take this   omeprazole 20 MG capsule Commonly known as: PRILOSEC Take 1 capsule (20 mg total) by mouth as needed. What changed:  when to take this reasons to take this   ONE TOUCH ULTRA TEST test strip Generic drug: glucose blood CHECK ONCE A DAY   glucose blood test strip Commonly known as: ONE TOUCH ULTRA TEST Check once daily. E11.40   potassium chloride 10 MEQ tablet Commonly known as: KLOR-CON Take 1 tablet (10 mEq total) by mouth 3 (three) times daily. What changed:  how much to take when to take this additional instructions   torsemide 20 MG tablet Commonly known as: DEMADEX Take 1 tablet (20 mg total) by mouth 2 (two) times daily.   [name] FlexTouch 100 UNIT/ML FlexTouch Pen Generic drug: insulin degludec Inject 24 units into THE SKIN ONCE daily AT 10pm What changed:  how much to  take when to take this additional instructions   vitamin B-12 1000 MCG tablet Commonly known as: CYANOCOBALAMIN Take 1 tablet (1,000 mcg total) by mouth daily.   Vitamin D3 125 MCG (5000 UT) Caps TAKE ONE CAPSULE BY MOUTH EVERY MORNING        Disposition:    Follow-up Information     [facility] Follow up.   Specialty: Cardiology Why: on 3/3 at 10 am for post hospital tikosyn follow up Contact information: [id number] [place] [id number] [phone]                Duration of Discharge Encounter: Greater than 30 minutes including physician time.  Jacalyn Lefevre, PA-C  08/29/2021 11:39 AM    I have seen and examined this patient  with Oda Kilts.  Agree with above, note added to reflect my findings.  Cardioverted yesterday.  Remains in sinus rhythm.  Happy with how she has been feeling.  GEN: Well nourished, well developed, in no acute distress  HEENT: normal  Neck: no JVD, carotid bruits, or masses Cardiac: RRR; no murmurs, rubs, or gallops,no edema  Respiratory:  clear to auscultation bilaterally, normal work of breathing GI: soft, nontender, nondistended, + BS MS: no deformity or atrophy  Skin: warm and dry Neuro:  Strength and sensation are intact Psych: euthymic mood, full affect   Persistent atrial fibrillation: Currently status post cardioversion and remains in sinus rhythm.  QTc is stable on dose of Tikosyn.  Plan for discharge today with follow-up in clinic.  Mikhai Bienvenue M. Sherron Mapp MD 08/29/2021 3:27 PM

## 2021-08-29 NOTE — Progress Notes (Addendum)
Pharmacy: Dofetilide (Tikosyn) - Follow Up Assessment and Electrolyte Replacement  Pharmacy consulted to assist in monitoring and replacing electrolytes in this 88 y.o. female admitted on 08/26/2021 undergoing dofetilide initiation. First dofetilide dose: 2/22@2102   Labs:    Component Value Date/Time   K 3.7 08/29/2021 0329   K 4.1 04/03/2014 1026   MG 2.2 08/29/2021 0329     Plan: Potassium: K 3.5-3.7:  Give KCl 60 mEq po x1   Consider increasing potassium supplements to 35meq tid at discharge with close follow up.   Magnesium: Mg > 2: No additional supplementation needed  Thank you for allowing pharmacy to participate in this patient's care   Erin Hearing PharmD., BCPS Clinical Pharmacist 08/29/2021 7:58 AM

## 2021-09-01 DIAGNOSIS — M542 Cervicalgia: Secondary | ICD-10-CM | POA: Diagnosis not present

## 2021-09-05 ENCOUNTER — Encounter (HOSPITAL_COMMUNITY): Payer: Self-pay | Admitting: Physician Assistant

## 2021-09-05 ENCOUNTER — Ambulatory Visit (HOSPITAL_COMMUNITY)
Admit: 2021-09-05 | Discharge: 2021-09-05 | Disposition: A | Discharge status: Home / Self Care | Payer: Medicare Other | Source: Ambulatory Visit | Attending: Physician Assistant | Admitting: Physician Assistant

## 2021-09-05 ENCOUNTER — Telehealth: Payer: Self-pay | Admitting: Pharmacist

## 2021-09-05 ENCOUNTER — Other Ambulatory Visit: Payer: Self-pay

## 2021-09-05 VITALS — BP 98/58 | HR 81 | Ht 61.0 in | Wt 124.6 lb

## 2021-09-05 DIAGNOSIS — Z7901 Long term (current) use of anticoagulants: Secondary | ICD-10-CM | POA: Diagnosis not present

## 2021-09-05 DIAGNOSIS — I4819 Other persistent atrial fibrillation: Secondary | ICD-10-CM | POA: Diagnosis not present

## 2021-09-05 DIAGNOSIS — E1142 Type 2 diabetes mellitus with diabetic polyneuropathy: Secondary | ICD-10-CM | POA: Insufficient documentation

## 2021-09-05 DIAGNOSIS — D6869 Other thrombophilia: Secondary | ICD-10-CM

## 2021-09-05 DIAGNOSIS — Z79899 Other long term (current) drug therapy: Secondary | ICD-10-CM | POA: Diagnosis not present

## 2021-09-05 DIAGNOSIS — I4510 Unspecified right bundle-branch block: Secondary | ICD-10-CM | POA: Diagnosis not present

## 2021-09-05 LAB — BASIC METABOLIC PANEL
Anion gap: 9 (ref 5–15)
BUN: 21 mg/dL (ref 8–23)
CO2: 23 mmol/L (ref 22–32)
Calcium: 9.4 mg/dL (ref 8.9–10.3)
Chloride: 105 mmol/L (ref 98–111)
Creatinine, Ser: 0.93 mg/dL (ref 0.44–1.00)
GFR, Estimated: 59 mL/min — ABNORMAL LOW (ref >60)
Glucose, Bld: 359 mg/dL — ABNORMAL HIGH (ref 70–99)
Potassium: 3.9 mmol/L (ref 3.5–5.1)
Sodium: 137 mmol/L (ref 135–145)

## 2021-09-05 LAB — MAGNESIUM: Magnesium: 2 mg/dL (ref 1.7–2.4)

## 2021-09-05 MED ORDER — METOPROLOL SUCCINATE ER 25 MG PO TB24: 12.5000 mg | ORAL_TABLET | Freq: Every morning | ORAL | 3 refills | Status: DC

## 2021-09-05 NOTE — Patient Instructions (Signed)
Decrease metoprolol to 1/2 tablet once a day ?

## 2021-09-05 NOTE — Progress Notes (Signed)
Primary Care Physician: Eulas Post, MD Referring Physician:Dr. Martinique    Julie Norton is a 88 y.o. female with a h/o afib, RBBB, DM, peripheral neuropathy, that is in the afib clinic for options for  restoring SR.   She was seen by Dr. Elease Hashimoto on 09/20/17. Found to be in Afib with rate 95. Started on Eliquis. Follow up Echo showed mild biatrial enlargement and normal LV function. She  noted some fatigue, decreased energy and leg swelling. Some dyspnea and chest tightness. Was started on lasix with improvement of these symptoms. She subsequently underwent TEE guided DCCV on 10/28/17 with return to NSR.   She  was  seen in early September 2020 by Dr. Martinique,  with  increased leg swelling for 2 months. Noted SOB going up stairs. Was found to be in Afib with rate 105. Toprol XL was added for rate control. Lasix was increased.  He  did try her on Flecainide but this resulted in severe nausea and diarrhea and was discontinued.    She subsequently underwent successful DCCV on 04/06/19. Seen back on October 27 and was in NSR. Dr Burchette's note of Dec 18 thought she was back in Afib but no Ecg done. Rate controlled. When seen in Dr. Doug Sou office,  she was in Afib.  She was started on amiodarone  at 400 mg daily. She underwent repeat DCCV on March 5 successfully. Due to some nausea, amiodarone was reduced to 200 mg daily.    She did develop side effects on amiodarone and this was discontinued in December 2021. Had symptoms despite reduction in dose to 100 mg daily. She reports feeling much better after drug stopped.   Repeat Echo showed normal LV function. There was increased RA size and TR. BNP was 180. UA negative for proteinuria.   Her husband died in 03-11-21  and this has brought on increased stress. When seen in 03/12/23, she was in rhythm.   She reported to Dr. Martinique on visit 06/13/21,  over the last 2 days she had felt out of rhythm. She noted significant increase in weight,  swelling, and SOB. Some chest tightness. Has been compliant with medication but notes she missed one dose of Eliquis on Dec 1 which was very unusual for her.    Dr. Martinique wanted her to be evaluated here to see if Tikosyn could be an option. She is here with her daughter today but she does live alone at her home.   F/u in afib clinic,  07/22/21 for Tikosyn admit. Pt is aware that [name] will be attempted, hope to restore  SR but not ideal situation with renal clearance only allowing 125 mcg  dofetilide bid and qt length  in SR (470 ms with RBBB contributing). QT today is 490 ms in afib.  Pre admit labs today show K+ at 3.2 and mag at 1.8, with crcl cal  at 33.61.  Labs not optimal to receive drug today, so will increase daily K+ and add magnesium and recheck labs next week to see if able to purse hospitalization for tikosyn. She does states that she had several loose stools a few days ago which have possibly led to the low K+/mag.  Pt is back today, 08/28/21 for attempting Tikosyn admit again. Her labs today look more optimal with a K+ of 3.8 and mag at 2.0. with crcl cal at 34 ml/min, she would only qualify for 125 mcg Tikosyn bid.   Follow up in the AF  clinic 09/05/21. Patient was loaded on dofetilide 2/21-2/24/23 with DCCV on 08/28/21. Patient reports that she does not feel much different in SR. She continues to be fatigued, especially with exertion. She is also having issues with her arthritis in her neck.   Today, she denies symptoms of palpitations, chest pain, shortness of breath, orthopnea, PND, lower extremity edema, dizziness, presyncope, syncope, or neurologic sequela. The patient is tolerating medications without difficulties and is otherwise without complaint today.   Past Medical History:  Diagnosis Date   Abdominal adhesions    Abdominal gas pain    suspected -- may be related to intermittent right upper quadrant discomfort radiating around her back       Anemia    Anginal pain (HCC)     Arthritis    Atrial fibrillation (HCC)    Breast cancer (HCC)    Breast mass    left breast   Cancer (HCC)    left breast   Chest pain    intermittent right upper quadrant discomfort radiating around her back       Complication of anesthesia    Diabetes mellitus without complication (HCC)    Type II   Dyspepsia    Dyspnea    GERD (gastroesophageal reflux disease)    H/O: hysterectomy 1978   History of cardioversion    Hypercholesteremia    Hypothyroidism    Mitral insufficiency    Personal history of radiation therapy    Pneumonia    PONV (postoperative nausea and vomiting)    Right bundle branch block (RBBB)    Tendinitis    of the right arm and shoulder   Torn tendon    right shoulder   Past Surgical History:  Procedure Laterality Date   ABDOMINAL HYSTERECTOMY     APPENDECTOMY  1978   BLADDER REPAIR     BREAST LUMPECTOMY Left 02/24/2011   central partial mastectomy - dr Margot Chimes   BREAST REDUCTION SURGERY     CARDIOVERSION N/A 10/28/2017   Procedure: CARDIOVERSION;  Surgeon: Lelon Perla, MD;  Location: [place];  Service: Cardiovascular;  Laterality: N/A;   CARDIOVERSION N/A 04/06/2019   Procedure: CARDIOVERSION;  Surgeon: [name], MD;  Location: [place];  Service: Cardiovascular;  Laterality: N/A;   CARDIOVERSION N/A 09/08/2019   Procedure: CARDIOVERSION;  Surgeon: Dorothy Spark, MD;  Location: [facility] ENDOSCOPY;  Service: Cardiovascular;  Laterality: N/A;   CARDIOVERSION N/A 08/28/2021   Procedure: CARDIOVERSION;  Surgeon: [name], MD;  Location: [place];  Service: Cardiovascular;  Laterality: N/A;   COLONOSCOPY WITH PROPOFOL N/A 03/24/2017   Procedure: COLONOSCOPY WITH PROPOFOL;  Surgeon: Wilford Corner, MD;  Location: [facility] ENDOSCOPY;  Service: Endoscopy;  Laterality: N/A;   ESOPHAGOGASTRODUODENOSCOPY (EGD) WITH PROPOFOL N/A 03/24/2017   Procedure: ESOPHAGOGASTRODUODENOSCOPY (EGD) WITH PROPOFOL;  Surgeon: Wilford Corner,  MD;  Location: [place];  Service: Endoscopy;  Laterality: N/A;   LIPOMA EXCISION Right    REDUCTION MAMMAPLASTY Bilateral 1998   RETINAL DETACHMENT SURGERY Left    SALPINGOOPHORECTOMY     TEE WITHOUT CARDIOVERSION N/A 10/28/2017   Procedure: TRANSESOPHAGEAL ECHOCARDIOGRAM (TEE);  Surgeon: Lelon Perla, MD;  Location: [facility] ENDOSCOPY;  Service: Cardiovascular;  Laterality: N/A;   [place]   at 88 years of age    Current Outpatient Medications  Medication Sig Dispense Refill   acetaminophen (TYLENOL) 500 MG tablet Take 1,000 mg by mouth every 6 (six) hours as needed for mild pain or moderate pain.  Calcium Carb-Cholecalciferol (CALCIUM+D3 PO) Take 1 tablet by mouth daily with breakfast.     Cholecalciferol (VITAMIN D3) 125 MCG (5000 UT) CAPS TAKE ONE CAPSULE BY MOUTH EVERY MORNING 90 capsule 0   dofetilide (TIKOSYN) 125 MCG capsule Take 1 capsule (125 mcg total) by mouth 2 (two) times daily. 60 capsule 6   ELIQUIS 5 MG TABS tablet TAKE ONE TABLET BY MOUTH EVERY MORNING and TAKE ONE TABLET BY MOUTH EVERY EVENING 180 tablet 1   ferrous sulfate 325 (65 FE) MG EC tablet Take 325 mg daily  3   glucose blood (ONE TOUCH ULTRA TEST) test strip Check once daily. E11.40 100 each 11   insulin degludec (TRESIBA FLEXTOUCH) 100 UNIT/ML FlexTouch Pen Inject 24 units into THE SKIN ONCE daily AT 10pm (Patient taking differently: 14-18 Units in the morning.) 15 mL 3   Insulin Pen Needle 29G X 5MM MISC Use once daily 100 each 3   levothyroxine (SYNTHROID) 50 MCG tablet TAKE ONE TABLET BY MOUTH BEFORE BREAKFAST 90 tablet 1   lidocaine (LIDODERM) 5 % Place 1 patch onto the skin See admin instructions. Apply 1 patch to the neck every day     loratadine (CLARITIN) 10 MG tablet Take 10 mg by mouth daily as needed for allergies.     MAGNESIUM PO Take 1 tablet by mouth daily with breakfast.     metFORMIN (GLUCOPHAGE-XR) 500 MG 24 hr tablet Take 1 tablet (500 mg total) by mouth  daily. 90 tablet 1   omeprazole (PRILOSEC) 20 MG capsule Take 1 capsule (20 mg total) by mouth as needed. (Patient taking differently: Take 20 mg by mouth 2 (two) times daily as needed (for reflux or heartburn).)     ONE TOUCH ULTRA TEST test strip CHECK ONCE A DAY 50 each 3   potassium chloride (KLOR-CON) 10 MEQ tablet Take 1 tablet (10 mEq total) by mouth 3 (three) times daily. 270 tablet 1   torsemide (DEMADEX) 20 MG tablet Take 1 tablet (20 mg total) by mouth 2 (two) times daily. 180 tablet 3   vitamin B-12 (CYANOCOBALAMIN) 1000 MCG tablet Take 1 tablet (1,000 mcg total) by mouth daily. 90 tablet 3   metoprolol succinate (TOPROL-XL) 25 MG 24 hr tablet Take 0.5 tablets (12.5 mg total) by mouth every morning. 90 tablet 3   No current facility-administered medications for this encounter.    Allergies  Allergen Reactions   Bactrim Nausea And Vomiting   Dilaudid [Hydromorphone Hcl] Nausea Only   Hydrocodone Nausea And Vomiting   Invokana [Canagliflozin] Itching and Other (See Comments)    Yeast Infections    Macrodantin Nausea And Vomiting    Social History   Socioeconomic History   Marital status: Widowed    Spouse name: Not on file   Number of children: Not on file   Years of education: Not on file   Highest education level: Not on file  Occupational History   Not on file  Tobacco Use   Smoking status: Never   Smokeless tobacco: Never  Vaping Use   Vaping Use: Never used  Substance and Sexual Activity   Alcohol use: No   Drug use: No   Sexual activity: Not on file  Other Topics Concern   Not on file  Social History Narrative   Not on file   Social Determinants of Health   Financial Resource Strain: Low Risk    Difficulty of Paying Living Expenses: Not hard at all  Food Insecurity: No Food Insecurity  Worried About Charity fundraiser in the Last Year: Never true   Platinum in the Last Year: Never true  Transportation Needs: No Transportation Needs   Lack  of Transportation (Medical): No   Lack of Transportation (Non-Medical): No  Physical Activity: Inactive   Days of Exercise per Week: 0 days   Minutes of Exercise per Session: 0 min  Stress: No Stress Concern Present   Feeling of Stress : Only a little  Social Connections: Moderately Isolated   Frequency of Communication with Friends and Family: More than three times a week   Frequency of Social Gatherings with Friends and Family: Once a week   Attends Religious Services: Never   Marine scientist or Organizations: No   Attends Archivist Meetings: Never   Marital Status: Married  Human resources officer Violence: Not At Risk   Fear of Current or Ex-Partner: No   Emotionally Abused: No   Physically Abused: No   Sexually Abused: No    Family History  Problem Relation Age of Onset   Pneumonia Father    Heart attack Mother    Hypertension Mother    Stroke Mother    Diabetes Mother    Aortic aneurysm Mother        abdominal aortic aneurysm   Cancer Mother        bladder   Aortic aneurysm Sister        abdominal aortic aneurysm    ROS- All systems are reviewed and negative except as per the HPI above  Physical Exam: Vitals:   09/05/21 1012  BP: (!) 98/58  Pulse: 81  Weight: 56.5 kg  Height: 5\' 1"  (1.549 m)    Wt Readings from Last 3 Encounters:  09/05/21 56.5 kg  08/26/21 56.8 kg  08/26/21 56.8 kg    Labs: Lab Results  Component Value Date   NA 140 08/29/2021   K 3.7 08/29/2021   CL 107 08/29/2021   CO2 25 08/29/2021   GLUCOSE 86 08/29/2021   BUN 17 08/29/2021   CREATININE 1.00 08/29/2021   CALCIUM 9.6 08/29/2021   MG 2.2 08/29/2021   Lab Results  Component Value Date   INR 1.7 (H) 08/27/2021   Lab Results  Component Value Date   CHOL 94 06/23/2019   HDL 35.20 (L) 06/23/2019   LDLCALC 45 06/23/2019   TRIG 70.0 06/23/2019    GEN- The patient is a well appearing elderly female, alert and oriented x 3 today.   HEENT-head normocephalic,  atraumatic, sclera clear, conjunctiva pink, hearing intact, trachea midline. Lungs- Clear to ausculation bilaterally, normal work of breathing Heart- Regular rate and rhythm, no murmurs, rubs or gallops  GI- soft, NT, ND, + BS Extremities- no clubbing, cyanosis, or edema MS- no significant deformity or atrophy Skin- no rash or lesion Psych- euthymic mood, full affect Neuro- strength and sensation are intact   EKG- SR, PACs, 1st degree AV block, RBBB Vent. rate 81 BPM PR interval 230 ms QRS duration 132 ms QT/QTcB 462/536 ms  Echo 06/24/20 left ventricle has normal function. The left ventricle has no regional  wall motion abnormalities. Left ventricular diastolic parameters are  consistent with Grade II diastolic  dysfunction (pseudonormalization).   2. Right ventricular systolic function is mildly reduced. The right  ventricular size is mildly enlarged. There is normal pulmonary artery  systolic pressure.   3. Right atrial size was severely dilated.   4. The mitral valve is normal in structure.  Trivial mitral valve  regurgitation.   5. Tricuspid valve regurgitation is moderate.   6. The aortic valve is tricuspid. There is mild thickening of the aortic  valve. Aortic valve regurgitation is not visualized. No aortic stenosis is  present.   7. Aortic dilatation noted. There is mild dilatation of the aortic root,  measuring 36 mm. There is mild dilatation of the ascending aorta,  measuring 37 mm.   8. The inferior vena cava is dilated in size with <50% respiratory  variability, suggesting right atrial pressure of 15 mmHg.    Assessment and Plan:  1. Persistent symptomatic rate controlled afib  Pt has failed flecainide and amiodarone  S/p dofetilide loading 2/21-2/24/23 with DCCV on 08/28/21 Patient appears to be maintaining SR.  Continue dofetilide 125 mcg BID. QT borderline today (497 ms measured manually) Will bring back next week to recheck. Check bmet/mag today. Continue  Eliquis 5 mg BID. She does qualify for 2.5 mg dose based on age and weight. Will not change today as she is post DCCV, would consider decreasing at follow up. Decrease metoprolol to 12.5 mg daily given low BP.  2. CHA2DS2VASc  score of 4 Continue Eliquis 5 mg BID   Follow up in the AF clinic early next week for repeat ECG.    [facility] [address] [place], [place] [address] [phone]

## 2021-09-05 NOTE — Chronic Care Management (AMB) (Addendum)
Chronic Care Management Pharmacy Assistant   Name: Julie Norton  MRN: [id number] DOB: [birth year]  Reason for Encounter: Medication Review/ Medication Coordination   Conditions to be addressed/monitored: DMII  Recent office visits:  05/07/21 Julie Norton - Patient presented for immunization against influenza and other concerns. No medication changes noted.  Recent consult visits:  09/04/21 Julie Norton ([facility]) - Patient presented for XR of Cervical spine. No medication changes noted.  08/28/21 Julie Dresser MD (Cardiovascular) Patient presented for Cadioversion Procedure. No medication changes noted.  08/26/21 Julie Needs, NP (Cardiology) - Patient presented for Persistent atrial fibrillation and other concerns. Changed Omeprazole 20 mg  08/18/21 Julie Norton ([facility]) - Patient presented for Neck pain. No medication changes noted.   Hospital visits:  Medication Reconciliation was completed by comparing discharge summary, patients EMR and Pharmacy list, and upon discussion with patient.  Patient presented to [facility] on 08/26/21 due to Persistent Atrial Fibrillation. Patient was present for 3 days.  New?Medications Started at [facility] Discharge:?? -started  dofetilide Julie Norton)  Medication Changes at Hospital Discharge: -Changed  none  Medications Discontinued at Hospital Discharge: -Stopped  zolpidem 5 MG tablet  Medications that remain the same after Hospital Discharge:??  -All other medications will remain the same.    Medications: Outpatient Encounter Medications as of 09/05/2021  Medication Sig Note   acetaminophen (TYLENOL) 500 MG tablet Take 1,000 mg by mouth every 6 (six) hours as needed for mild pain or moderate pain.     Calcium Carb-Cholecalciferol (CALCIUM+D3 PO) Take 1 tablet by mouth daily with breakfast.    Cholecalciferol (VITAMIN D3) 125 MCG (5000 UT) CAPS TAKE ONE CAPSULE BY MOUTH EVERY MORNING  (Patient not taking: Reported on 08/26/2021)    dofetilide (TIKOSYN) 125 MCG capsule Take 1 capsule (125 mcg total) by mouth 2 (two) times daily.    ELIQUIS 5 MG TABS tablet TAKE ONE TABLET BY MOUTH EVERY MORNING and TAKE ONE TABLET BY MOUTH EVERY EVENING (Patient taking differently: Take 5 mg by mouth 2 (two) times daily.)    ferrous sulfate 325 (65 FE) MG EC tablet Take 325 mg daily (Patient taking differently: Take 325 mg by mouth daily with breakfast. Take 325 mg daily)    glucose blood (ONE TOUCH ULTRA TEST) test strip Check once daily. E11.40    insulin degludec (TRESIBA FLEXTOUCH) 100 UNIT/ML FlexTouch Pen Inject 24 units into THE SKIN ONCE daily AT 10pm (Patient taking differently: 14-18 Units in the morning.)    Insulin Pen Needle 29G X 5MM MISC Use once daily    levothyroxine (SYNTHROID) 50 MCG tablet TAKE ONE TABLET BY MOUTH BEFORE BREAKFAST (Patient taking differently: Take 50 mcg by mouth daily before breakfast.)    lidocaine (LIDODERM) 5 % Place 1 patch onto the skin See admin instructions. Apply 1 patch to the neck every day 08/26/2021: The patient DOES have a patch on at this time   loratadine (CLARITIN) 10 MG tablet Take 10 mg by mouth daily as needed for allergies.    MAGNESIUM PO Take 1 tablet by mouth daily with breakfast. 08/26/2021: Strength not recalled   metFORMIN (GLUCOPHAGE-XR) 500 MG 24 hr tablet Take 1 tablet (500 mg total) by mouth daily.    metoprolol succinate (TOPROL-XL) 25 MG 24 hr tablet TAKE ONE TABLET BY MOUTH EVERY MORNING (Patient taking differently: Take 25 mg by mouth in the morning.)    omeprazole (PRILOSEC) 20 MG capsule Take 1 capsule (20 mg total) by  mouth as needed. (Patient taking differently: Take 20 mg by mouth 2 (two) times daily as needed (for reflux or heartburn).)    ONE TOUCH ULTRA TEST test strip CHECK ONCE A DAY    potassium chloride (KLOR-CON) 10 MEQ tablet Take 1 tablet (10 mEq total) by mouth 3 (three) times daily. (Patient taking differently:  Take 10-20 mEq by mouth See admin instructions. Take 20 mEq by mouth in the morning and 10 mEq at bedtime)    torsemide (DEMADEX) 20 MG tablet Take 1 tablet (20 mg total) by mouth 2 (two) times daily.    vitamin B-12 (CYANOCOBALAMIN) 1000 MCG tablet Take 1 tablet (1,000 mcg total) by mouth daily.    No facility-administered encounter medications on file as of 09/05/2021.   Recent Relevant Labs: Lab Results  Component Value Date/Time   HGBA1C 8.4 (H) 08/28/2021 03:03 AM   HGBA1C 10.5 (H) 01/21/2021 03:43 PM   MICROALBUR 0.9 04/07/2013 07:58 AM    Kidney Function Lab Results  Component Value Date/Time   CREATININE 1.00 08/29/2021 03:29 AM   CREATININE 0.99 08/28/2021 03:03 AM   CREATININE 0.81 04/22/2020 04:17 PM   CREATININE 0.69 07/30/2016 08:08 AM   CREATININE 0.8 04/03/2014 10:26 AM   CREATININE 0.8 03/20/2013 09:10 AM   GFR 54.77 (L) 01/21/2021 03:43 PM   GFRNONAA 54 (L) 08/29/2021 03:29 AM   GFRAA 75 06/13/2020 02:15 PM    Current antihyperglycemic regimen:  Metformin ER 500mg , 1 tablet twice daily - taking once a day Tresiba Flextouch, inject 24 units once daily at 10 PM What recent interventions/DTPs have been made to improve glycemic control:  Patient reports none Have there been any recent hospitalizations or ED visits since last visit with CPP? Yes Patient denies hypoglycemic symptoms, including None Patient denies hyperglycemic symptoms, including none How often are you checking your blood sugar? once daily What are your blood sugars ranging?  Fasting: 115 During the week, how often does your blood glucose drop below 70?  Rarely  Adherence Review: Is the patient currently on a STATIN medication? No Is the patient currently on ACE/ARB medication? No Does the patient have >5 day gap between last estimated fill dates? No  Reviewed chart for medication changes ahead of medication coordination call.  BP Readings from Last 3 Encounters:  08/29/21 (!) 116/58   08/26/21 (!) 106/56  07/22/21 (!) 96/54    Lab Results  Component Value Date   HGBA1C 8.4 (H) 08/28/2021     Patient obtains medications through Adherence Packaging  30 Days   Last adherence delivery included:  Eliquis 5mg  - take 1 tablet with breakfast and 1 tablet with dinner Omeprazole 20mg  -  take 1 capsule daily before breakfast and 1 capsule before evening meal Levothyroxine 22mcg - take 1 tablet before breakfast Metformin 500 mg -   take one tablet at dinner Torsemide 20mg  - take one tablet with breakfast and one tablet at dinner Metoprolol ER 25mg  - take one tablet daily with breakfast Zolpidem (Ambien) 5 mg - take one tablet at bedtime PRN Potassium Chloride 10 meq- take 2 tablets at breakfast and one tablet at dinner (total 30 mEq)     Patient declined the following medications : Tresiba Flextouch 100u - Inject 24 units once daily (Patient reports she has 1 and a half boxes left will call when needed)    Patient is due for next adherence delivery on: 09/17/21. Called patient and reviewed medications and coordinated delivery. Confirmed Packaging for 30 DS  This delivery to include: Eliquis 5mg  - take 1 tablet with breakfast and 1 tablet with dinner Metoprolol ER 25mg  - take half tablet daily with breakfast Levothyroxine 25mcg - take 1 tablet before breakfast Metformin 500 mg -   take one tablet at dinner Potassium Chloride 10 meq- take 2 tablets at breakfast and one tablet at dinner (total 30 mEq)  Torsemide 20mg  - take one tablet with breakfast and one tablet at dinner  Requested updated script for Metoprolol be sent to [facility]  dofetilide (TIKOSYN) 125 MCG capsule 08/29/2021 30  Patient reports she uses good rx for the above medication does not need transfer. Patient also aware of decrease in dose for her Metoprolol as she has been halving the pills since her appointment she reports she will return on tomorrow for a follow up.   Patient declined the following  medications: Omeprazole 20mg  -  take 1 capsule daily before breakfast and 1 capsule before evening meal - patient reports on hand abundance   Confirmed delivery date of 09/17/21, advised patient that pharmacy will contact them the morning of delivery.    Care Gaps: Zoster Vaccine - Overdue Urine Micro - Overdue Foot Exam - Overdue PNA Vaccine - Overdue COVID Booster - Overdue BP- 106/56 ( 08/26/21) CCM- 6/23 AWV- 3/22 Lab Results  Component Value Date   HGBA1C 8.4 (H) 08/28/2021    Star Rating Drugs: Metformin 500 mg - Last filled 08/13/21 30 DS at [facility]   Patient Assistance: Eliquis -  Denied in 2022 - 06/11/21 offered free trial card for eliquis per Jeni Salles patient accepted   [place] Clinical Pharmacist Assistant [phone]

## 2021-09-09 ENCOUNTER — Ambulatory Visit (HOSPITAL_COMMUNITY)
Admission: RE | Admit: 2021-09-09 | Discharge: 2021-09-09 | Disposition: A | Discharge status: Home / Self Care | Payer: Medicare Other | Source: Ambulatory Visit | Attending: Nurse Practitioner | Admitting: Nurse Practitioner

## 2021-09-09 ENCOUNTER — Other Ambulatory Visit: Payer: Self-pay

## 2021-09-09 VITALS — BP 94/58 | HR 79

## 2021-09-09 DIAGNOSIS — I440 Atrioventricular block, first degree: Secondary | ICD-10-CM | POA: Insufficient documentation

## 2021-09-09 DIAGNOSIS — Z79899 Other long term (current) drug therapy: Secondary | ICD-10-CM | POA: Insufficient documentation

## 2021-09-09 DIAGNOSIS — D6869 Other thrombophilia: Secondary | ICD-10-CM

## 2021-09-09 DIAGNOSIS — I4510 Unspecified right bundle-branch block: Secondary | ICD-10-CM | POA: Insufficient documentation

## 2021-09-09 DIAGNOSIS — R9431 Abnormal electrocardiogram [ECG] [EKG]: Secondary | ICD-10-CM | POA: Insufficient documentation

## 2021-09-09 DIAGNOSIS — I4819 Other persistent atrial fibrillation: Secondary | ICD-10-CM

## 2021-09-09 MED ORDER — DOFETILIDE 125 MCG PO CAPS: 125.0000 ug | ORAL_CAPSULE | Freq: Two times a day (BID) | ORAL | 1 refills | Status: DC

## 2021-09-09 MED ORDER — METOPROLOL SUCCINATE ER 25 MG PO TB24: 12.5000 mg | ORAL_TABLET | Freq: Every morning | ORAL | 3 refills | Status: DC

## 2021-09-09 NOTE — Addendum Note (Signed)
Encounter addended by: Juluis Mire, RN on: 09/09/2021 1:54 PM ? Actions taken: Pharmacy for encounter modified, Order list changed

## 2021-09-09 NOTE — Addendum Note (Signed)
Encounter addended by: [name], NP on: 09/09/2021 2:23 PM ? Actions taken: Clinical Note Signed

## 2021-09-09 NOTE — Patient Instructions (Signed)
Try to decrease toresemide to once a day -- weigh yourself every morning - if you start to see an increase of 3-5lbs then go back to twice a day ?

## 2021-09-09 NOTE — Addendum Note (Signed)
Encounter addended by: Juluis Mire, RN on: 09/09/2021 3:48 PM ? Actions taken: Pharmacy for encounter modified, Order list changed

## 2021-09-09 NOTE — Progress Notes (Addendum)
Pt in for ekg with borderline Qtc on last visit.corrected Qtc is still at 486 to 503 ms. Will send for Dr. Quentin Ore to review. RBBB is contributing to longer qt.  BP also soft on last visit and metoprolol was cut in half. Her BP is still soft at  94/58. Occasional lightheadedness Appears to be drinking ample liquids. Now back in rhythm she is more stable with fluid. Will try to decrease torsemide to 20 mg daily. She will weight daily and go back to bid if 3-5 lbs of weight gain in 1-2 days. F/u with Dr. Martinique 3/14 and Oda Kilts, [place] 3/28.  ? ?Addendum- Dr. Quentin Ore reviewed EKG and felt the qtc corrected was 475 ms and ok to continue Tikosyn.  ?

## 2021-09-11 ENCOUNTER — Other Ambulatory Visit: Payer: Self-pay

## 2021-09-11 ENCOUNTER — Encounter: Payer: Self-pay | Admitting: Physical Therapy

## 2021-09-11 ENCOUNTER — Ambulatory Visit: Payer: Medicare Other | Attending: Specialist | Admitting: Physical Therapy

## 2021-09-11 DIAGNOSIS — M542 Cervicalgia: Secondary | ICD-10-CM

## 2021-09-11 DIAGNOSIS — R293 Abnormal posture: Secondary | ICD-10-CM | POA: Diagnosis not present

## 2021-09-11 DIAGNOSIS — M6281 Muscle weakness (generalized): Secondary | ICD-10-CM | POA: Diagnosis not present

## 2021-09-11 NOTE — Patient Instructions (Signed)
Access Code: [email or web address] ?URL: [email or web address] ?Date: 09/11/2021 ?Prepared by: Ruben Im ? ?Exercises ?Supine Shoulder Flexion Extension AAROM with Dowel - 2 x daily - 7 x weekly - 1 sets - 10 reps ?Supine Shoulder Press - 2 x daily - 7 x weekly - 1 sets - 10 reps ?Supine Cervical Retraction with Towel - 2 x daily - 7 x weekly - 1 sets - 5 reps ? ?

## 2021-09-11 NOTE — Progress Notes (Unsigned)
Cardiology Office Note   Date:  09/11/2021   ID:  DEVOIRY CORRIHER, DOB [birth year], MRN [id number]  PCP:  Eulas Post, MD  Cardiologist: Timmy Cleverly Martinique MD  No chief complaint on file.       History of Present Illness: Julie Norton is a 88 y.o. female who presents for follow up of Atrial fibrillation.   She has a past history of hypercholesterolemia. She also has a history of DM on  insulin. Echo in 2015 showed mild MR with normal LV function. She had a normal Myoview study in November 2017.   She was seen by Dr. Elease Hashimoto on 09/20/17. Found to be in Afib with rate 95. Started on Eliquis. Follow up Echo showed mild biatrial enlargement and normal LV function. She  noted some fatigue, decreased energy and leg swelling. Some dyspnea and chest tightness. Was started on lasix with improvement of these symptoms. She subsequently underwent TEE guided DCCV on 10/28/17 with return to NSR.   Was seen in early September 2020 with  increased leg swelling for 2 months. Noted SOB going up stairs. Was found to be in Afib with rate 105. Toprol XL was added for rate control. Lasix was increased.  We did try her on Flecainide but this resulted in severe nausea and diarrhea and was discontinued.   She was seen in the ED on 03/27/19 with complaints of epigastric pain/abdominal pain. Mildly elevated lipase. Other labs OK. Ecg in Afib with rate 80. CXR and CT abdomen were normal.  She subsequently underwent successful DCCV on 04/06/19. Seen back on October 27 and was in NSR. Dr Burchette's note of Dec 18 thought she was back in Afib but no Ecg done. Rate controlled. When seen in our office she was in Afib.  She was started on amiodarone  at 400 mg daily. She underwent repeat DCCV on March 5 successfully. Due to some nausea amiodarone was reduced to 200 mg daily.    She did develop side effects on amiodarone and this was discontinued in December 2021. Had symptoms despite reduction in dose to 100 mg  daily. She reports feeling much better afterwards.  Repeat Echo showed normal LV function. There was increased RA size and TR. BNP was 180. UA negative for proteinuria.   Her husband just passed away this year. When seen in August she was doing well and was still in NSR with long first degree AV block. Rate 75.   When seen in December she was found to have recurrent Afib. She noted significant increase in weight, swelling, and SOB. Some chest tightness. She was seen in the Afib clinic.  Patient was loaded on dofetilide 2/21-2/24/23 with DCCV on 08/28/21.      Past Medical History:  Diagnosis Date   Abdominal adhesions    Abdominal gas pain    suspected -- may be related to intermittent right upper quadrant discomfort radiating around her back       Anemia    Anginal pain (HCC)    Arthritis    Atrial fibrillation (HCC)    Breast cancer (HCC)    Breast mass    left breast   Cancer ([place])    left breast   Chest pain    intermittent right upper quadrant discomfort radiating around her back       Complication of anesthesia    Diabetes mellitus without complication (HCC)    Type II   Dyspepsia    Dyspnea  GERD (gastroesophageal reflux disease)    H/O: hysterectomy 1978   History of cardioversion    Hypercholesteremia    Hypothyroidism    Mitral insufficiency    Personal history of radiation therapy    Pneumonia    PONV (postoperative nausea and vomiting)    Right bundle branch block (RBBB)    Tendinitis    of the right arm and shoulder   Torn tendon    right shoulder    Past Surgical History:  Procedure Laterality Date   ABDOMINAL HYSTERECTOMY     APPENDECTOMY  1978   BLADDER REPAIR     BREAST LUMPECTOMY Left 02/24/2011   central partial mastectomy - dr Margot Chimes   BREAST REDUCTION SURGERY     CARDIOVERSION N/A 10/28/2017   Procedure: CARDIOVERSION;  Surgeon: Lelon Perla, MD;  Location: [facility];  Service: Cardiovascular;  Laterality: N/A;   CARDIOVERSION N/A  04/06/2019   Procedure: CARDIOVERSION;  Surgeon: [name], MD;  Location: [facility];  Service: Cardiovascular;  Laterality: N/A;   CARDIOVERSION N/A 09/08/2019   Procedure: CARDIOVERSION;  Surgeon: Dorothy Spark, MD;  Location: [facility] ENDOSCOPY;  Service: Cardiovascular;  Laterality: N/A;   CARDIOVERSION N/A 08/28/2021   Procedure: CARDIOVERSION;  Surgeon: [name], MD;  Location: [facility];  Service: Cardiovascular;  Laterality: N/A;   COLONOSCOPY WITH PROPOFOL N/A 03/24/2017   Procedure: COLONOSCOPY WITH PROPOFOL;  Surgeon: Wilford Corner, MD;  Location: [facility];  Service: Endoscopy;  Laterality: N/A;   ESOPHAGOGASTRODUODENOSCOPY (EGD) WITH PROPOFOL N/A 03/24/2017   Procedure: ESOPHAGOGASTRODUODENOSCOPY (EGD) WITH PROPOFOL;  Surgeon: Wilford Corner, MD;  Location: [facility];  Service: Endoscopy;  Laterality: N/A;   LIPOMA EXCISION Right    REDUCTION MAMMAPLASTY Bilateral 1998   RETINAL DETACHMENT SURGERY Left    SALPINGOOPHORECTOMY     TEE WITHOUT CARDIOVERSION N/A 10/28/2017   Procedure: TRANSESOPHAGEAL ECHOCARDIOGRAM (TEE);  Surgeon: Lelon Perla, MD;  Location: [facility] ENDOSCOPY;  Service: Cardiovascular;  Laterality: N/A;   [place]   at 88 years of age     Current Outpatient Medications  Medication Sig Dispense Refill   acetaminophen (TYLENOL) 500 MG tablet Take 1,000 mg by mouth every 6 (six) hours as needed for mild pain or moderate pain.      Calcium Carb-Cholecalciferol (CALCIUM+D3 PO) Take 1 tablet by mouth daily with breakfast.     Cholecalciferol (VITAMIN D3) 125 MCG (5000 UT) CAPS TAKE ONE CAPSULE BY MOUTH EVERY MORNING 90 capsule 0   dofetilide (TIKOSYN) 125 MCG capsule Take 1 capsule (125 mcg total) by mouth 2 (two) times daily. 180 capsule 1   ELIQUIS 5 MG TABS tablet TAKE ONE TABLET BY MOUTH EVERY MORNING and TAKE ONE TABLET BY MOUTH EVERY EVENING 180 tablet 1   ferrous sulfate 325 (65 FE) MG EC  tablet Take 325 mg daily  3   glucose blood (ONE TOUCH ULTRA TEST) test strip Check once daily. E11.40 100 each 11   insulin degludec (TRESIBA FLEXTOUCH) 100 UNIT/ML FlexTouch Pen Inject 24 units into THE SKIN ONCE daily AT 10pm (Patient taking differently: 14-18 Units in the morning.) 15 mL 3   Insulin Pen Needle 29G X 5MM MISC Use once daily 100 each 3   levothyroxine (SYNTHROID) 50 MCG tablet TAKE ONE TABLET BY MOUTH BEFORE BREAKFAST 90 tablet 1   lidocaine (LIDODERM) 5 % Place 1 patch onto the skin See admin instructions. Apply 1 patch to the neck every day     loratadine (CLARITIN) 10 MG  tablet Take 10 mg by mouth daily as needed for allergies.     MAGNESIUM PO Take 1 tablet by mouth daily with breakfast.     metFORMIN (GLUCOPHAGE-XR) 500 MG 24 hr tablet Take 1 tablet (500 mg total) by mouth daily. 90 tablet 1   metoprolol succinate (TOPROL-XL) 25 MG 24 hr tablet Take 0.5 tablets (12.5 mg total) by mouth every morning. 45 tablet 3   omeprazole (PRILOSEC) 20 MG capsule Take 1 capsule (20 mg total) by mouth as needed. (Patient taking differently: Take 20 mg by mouth 2 (two) times daily as needed (for reflux or heartburn).)     ONE TOUCH ULTRA TEST test strip CHECK ONCE A DAY 50 each 3   potassium chloride (KLOR-CON) 10 MEQ tablet Take 1 tablet (10 mEq total) by mouth 3 (three) times daily. 270 tablet 1   torsemide (DEMADEX) 20 MG tablet Take 1 tablet (20 mg total) by mouth 2 (two) times daily. 180 tablet 3   vitamin B-12 (CYANOCOBALAMIN) 1000 MCG tablet Take 1 tablet (1,000 mcg total) by mouth daily. 90 tablet 3   No current facility-administered medications for this visit.    Allergies:   Bactrim, Dilaudid [hydromorphone hcl], Hydrocodone, Invokana [canagliflozin], and Macrodantin    Social History:  The patient  reports that she has never smoked. She has never used smokeless tobacco. She reports that she does not drink alcohol and does not use drugs.   Family History:  The patient's  family history includes Aortic aneurysm in her mother and sister; Cancer in her mother; Diabetes in her mother; Heart attack in her mother; Hypertension in her mother; Pneumonia in her father; Stroke in her mother.    ROS:  Please see the history of present illness.   Otherwise, review of systems are positive for none.   All other systems are reviewed and negative.    PHYSICAL EXAM: VS:  There were no vitals taken for this visit. , BMI There is no height or weight on file to calculate BMI. GENERAL:  Well appearing elderly WF  In NAD HEENT:  PERRL, EOMI, sclera are clear. Oropharynx is clear. NECK:  + jugular venous distention, carotid upstroke brisk and symmetric, no bruits, no thyromegaly or adenopathy LUNGS:  Bibasilar rales.  CHEST:  Unremarkable HEART:  IRRR,  PMI not displaced or sustained,S1 and S2 within normal limits, no S3, no S4: no clicks, no rubs, there is a soft 1-2/6 SEM ABD:  Soft, nontender. BS +, no masses or bruits. No hepatomegaly, no splenomegaly EXT:  2 + pulses throughout, 2+  edema, no cyanosis no clubbing SKIN:  Warm and dry.  No rashes NEURO:  Alert and oriented x 3. Cranial nerves II through XII intact. PSYCH:  Cognitively intact  EKG:  EKG is  ordered today. Afib with rate 67. RBBB. QTc 478 msec. I have personally reviewed and interpreted this study.    Recent Labs: 06/13/2021: BNP 185.5; Hemoglobin 8.4; Platelets 172; TSH 2.660 09/05/2021: BUN 21; Creatinine, Ser 0.93; Magnesium 2.0; Potassium 3.9; Sodium 137    Lipid Panel    Component Value Date/Time   CHOL 94 06/23/2019 0825   CHOL 135 04/26/2017 0840   TRIG 70.0 06/23/2019 0825   HDL 35.20 (L) 06/23/2019 0825   HDL 45 04/26/2017 0840   CHOLHDL 3 06/23/2019 0825   VLDL 14.0 06/23/2019 0825   LDLCALC 45 06/23/2019 0825   LDLCALC 70 04/26/2017 0840      Wt Readings from Last 3 Encounters:  09/05/21 124 lb 9.6 oz (56.5 kg)  08/26/21 125 lb 3.5 oz (56.8 kg)  08/26/21 125 lb 3.2 oz (56.8 kg)       Lexiscan myoview 05/26/16:Study Highlights   Nuclear stress EF: 72%. There was no ST segment deviation noted during stress. This is a low risk study. The left ventricular ejection fraction is hyperdynamic (>65%).   1. Small, mild mid-anteroseptal perfusion defect.  This defect was actually more intense at rest than with stress.  No ischemia.  Suspect attenuation.  2. EF 72% with normal wall motion.  3. Overall low risk study.      Echo 09/24/17: Study Conclusions   - Left ventricle: The cavity size was normal. Systolic function was   normal. The estimated ejection fraction was in the range of 55%   to 60%. Wall motion was normal; there were no regional wall   motion abnormalities. - Mitral valve: There was mild regurgitation. - Left atrium: The atrium was mildly dilated. - Right atrium: The atrium was mildly dilated. - Atrial septum: No defect or patent foramen ovale was identified. - Pulmonary arteries: Systolic pressure was mildly increased. PA   peak pressure: 37 mm Hg (S).    Echo 06/24/20: IMPRESSIONS     1. Left ventricular ejection fraction, by estimation, is 60 to 65%. The  left ventricle has normal function. The left ventricle has no regional  wall motion abnormalities. Left ventricular diastolic parameters are  consistent with Grade II diastolic  dysfunction (pseudonormalization).   2. Right ventricular systolic function is mildly reduced. The right  ventricular size is mildly enlarged. There is normal pulmonary artery  systolic pressure.   3. Right atrial size was severely dilated.   4. The mitral valve is normal in structure. Trivial mitral valve  regurgitation.   5. Tricuspid valve regurgitation is moderate.   6. The aortic valve is tricuspid. There is mild thickening of the aortic  valve. Aortic valve regurgitation is not visualized. No aortic stenosis is  present.   7. Aortic dilatation noted. There is mild dilatation of the aortic root,  measuring 36  mm. There is mild dilatation of the ascending aorta,  measuring 37 mm.   8. The inferior vena cava is dilated in size with <50% respiratory  variability, suggesting right atrial pressure of 15 mmHg.   Comparison(s): Compared to prior study in 2019, there is now moderate TR.  Otherwise there is no significant change   ASSESSMENT AND PLAN:  1. Atrial fibrillation. Recurrent. [place] vasc score of 4. On Eliquis. Rate controlled on Toprol XL.  S/p TEE guided DCCV in April 2019. Repeat DCCV on April 06, 2019 with ERAD. She was intolerant of Flecainide. She was symptomatic with her Afib so was started on amiodarone. S/p  successful repeat DCCV on September 08, 2019.  Amiodarone was discontinued in December 2021 due to side effects. She now has recurrent Afib. S/p Tikosyn load and DCCV.   2.  Chronic diastolic CHF. She is significantly volume overloaded today. Weight up 10 lbs. Has edema and rales. Will increase Torsemide to 40 mg bid for 3 days then 20 mg bid. Check labs today.  Encourage sodium restriction. Echo 2021 was unremarkable except for moderate TR.   3. Hypercholesterolemia. On low dose lipitor.   4. Diabetes mellitus- now on insulin.    5. History of Breast CA.   6. RBBB and first degree AV block with long PR in past.   Follow up in Afib clinic.  Signed, Caedin Mogan [place] MD, [facility]   09/11/2021 7:51 AM    [place]

## 2021-09-11 NOTE — Therapy (Signed)
OUTPATIENT PHYSICAL THERAPY CERVICAL EVALUATION   Patient Name: Julie Norton MRN: [id number] DOB:1930/03/16, 88 y.o., female Today's Date: 09/11/2021   PT End of Session - 09/11/21 1528     Visit Number 1    Date for PT Re-Evaluation 11/06/21    Authorization Type [employer] Medicare    PT Start Time 1530    PT Stop Time 1610    PT Time Calculation (min) 40 min    Activity Tolerance Patient tolerated treatment well             Past Medical History:  Diagnosis Date   Abdominal adhesions    Abdominal gas pain    suspected -- may be related to intermittent right upper quadrant discomfort radiating around her back       Anemia    Anginal pain (HCC)    Arthritis    Atrial fibrillation (HCC)    Breast cancer (HCC)    Breast mass    left breast   Cancer (HCC)    left breast   Chest pain    intermittent right upper quadrant discomfort radiating around her back       Complication of anesthesia    Diabetes mellitus without complication (HCC)    Type II   Dyspepsia    Dyspnea    GERD (gastroesophageal reflux disease)    H/O: hysterectomy 1978   History of cardioversion    Hypercholesteremia    Hypothyroidism    Mitral insufficiency    Personal history of radiation therapy    Pneumonia    PONV (postoperative nausea and vomiting)    Right bundle branch block (RBBB)    Tendinitis    of the right arm and shoulder   Torn tendon    right shoulder   Past Surgical History:  Procedure Laterality Date   ABDOMINAL HYSTERECTOMY     APPENDECTOMY  1978   BLADDER REPAIR     BREAST LUMPECTOMY Left 02/24/2011   central partial mastectomy - dr Margot Chimes   BREAST REDUCTION SURGERY     CARDIOVERSION N/A 10/28/2017   Procedure: CARDIOVERSION;  Surgeon: Lelon Perla, MD;  Location: [place];  Service: Cardiovascular;  Laterality: N/A;   CARDIOVERSION N/A 04/06/2019   Procedure: CARDIOVERSION;  Surgeon: [name], MD;  Location: [place];  Service: Cardiovascular;   Laterality: N/A;   CARDIOVERSION N/A 09/08/2019   Procedure: CARDIOVERSION;  Surgeon: Dorothy Spark, MD;  Location: [facility] ENDOSCOPY;  Service: Cardiovascular;  Laterality: N/A;   CARDIOVERSION N/A 08/28/2021   Procedure: CARDIOVERSION;  Surgeon: [name], MD;  Location: [place];  Service: Cardiovascular;  Laterality: N/A;   COLONOSCOPY WITH PROPOFOL N/A 03/24/2017   Procedure: COLONOSCOPY WITH PROPOFOL;  Surgeon: Wilford Corner, MD;  Location: [facility] ENDOSCOPY;  Service: Endoscopy;  Laterality: N/A;   ESOPHAGOGASTRODUODENOSCOPY (EGD) WITH PROPOFOL N/A 03/24/2017   Procedure: ESOPHAGOGASTRODUODENOSCOPY (EGD) WITH PROPOFOL;  Surgeon: Wilford Corner, MD;  Location: [place];  Service: Endoscopy;  Laterality: N/A;   LIPOMA EXCISION Right    REDUCTION MAMMAPLASTY Bilateral 1998   RETINAL DETACHMENT SURGERY Left    SALPINGOOPHORECTOMY     TEE WITHOUT CARDIOVERSION N/A 10/28/2017   Procedure: TRANSESOPHAGEAL ECHOCARDIOGRAM (TEE);  Surgeon: Lelon Perla, MD;  Location: [facility] ENDOSCOPY;  Service: Cardiovascular;  Laterality: N/A;   [place]   at 88 years of age   Patient Active Problem List   Diagnosis Date Noted   Secondary hypercoagulable state ([place]) 09/05/2021   Persistent atrial fibrillation ([place]) 08/26/2021  Chronic insomnia 01/21/2021   Degeneration of lumbar intervertebral disc 02/03/2019   GERD (gastroesophageal reflux disease) 11/30/2017   Other persistent atrial fibrillation ([place])    Injury of muscle of rotator cuff 10/12/2017   Iron deficiency anemia, unspecified 03/24/2017   Poorly controlled type 2 diabetes mellitus ([place]) 02/23/2017   Mitral insufficiency 02/04/2016   Right bundle branch block 05/16/2015   Chest pain of unknown etiology 05/16/2015   Peripheral neuropathy 08/23/2014   Heart murmur previously undiagnosed 01/22/2014   Abdominal pain 11/11/2011   Ductal carcinoma in situ (DCIS) of left breast 01/12/2011    Hypercholesterolemia 11/28/2010   Hypothyroidism 11/28/2010   Postmenopausal state 11/28/2010   Osteoarthritis 11/28/2010   Dyspepsia 11/28/2010    PCP: Eulas Post, MD  REFERRING PROVIDER: Susa Day, MD  REFERRING DIAG: cervicalgia  THERAPY DIAG:  Neck stiffness, pain pain, abnormal posture   ONSET DATE: 07/06/2021  SUBJECTIVE:                                                                                                                                                                                                         SUBJECTIVE STATEMENT: The patient reports in January she began to have difficulty holding head up.  Gotten worse in last few weeks.  No apparent reason.  Wearing cervical collar now.   Gets regular massages.    PERTINENT HISTORY:  AFib, osteopenia; neuropathy; diabetes, OA neck, scoliosis; husband passed away in March 08, 2023 married 56 years; dtr [name] brought her today since she is unable to drive  PAIN:  Are you having pain? Yes NPRS scale: 6-7/10, has been 10/10 at times Pain location: neck Pain orientation: Posterior    Aggravating factors: worse end of the day, can't look up Relieving factors: Can hold head up for about 20 min in the mornings then gets tired, Ok lying down, collar helps some   PRECAUTIONS: Fall    FALLS:  Has patient fallen in last 6 months? 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, still working   PLOF: Independent with household mobility with device, cane in the house  PATIENT GOALS wants to continue working; spend time with family friends  OBJECTIVE:   DIAGNOSTIC FINDINGS:  Arthritis in neck;no fracture per Dr. Tonita Cong  PATIENT SURVEYS:  FOTO 38%   COGNITION: Overall cognitive status: Within  functional limits for  tasks assessed    POSTURE:  At rest head flexed 25 degrees flexion in sitting;  standing against wall at rest occiput 21cm from wall, with effort able to bring occiput 15cm from wall but unable to maintain 5 sec before head drops forward; inc thoracic kyphosis; right scoliosis   PALPATION: Tender points in bil suboccipitals, upper traps   CERVICAL AROM/PROM  A/PROM A/PROM (deg) 09/11/2021  Flexion   Extension 33   Right lateral flexion Unable to keep head in neutral for sidebend  Left lateral flexion   Right rotation 40   Left rotation 35   (Blank rows = not tested)  UE AROM/PROM:  A/PROM Right 09/11/2021 Left 09/11/2021  Shoulder flexion 120 in sitting; 150 in supine 120 in sitting; 150 in supine  Shoulder extension    Shoulder abduction    Shoulder adduction    Shoulder extension    Shoulder internal rotation WFLs WFLs  Shoulder external rotation WFLs WFLs  Elbow flexion    Elbow extension    Wrist flexion    Wrist extension    Wrist ulnar deviation    Wrist radial deviation    Wrist pronation    Wrist supination     (Blank rows = not tested)  UE MMT:  Cervical extensor strength for short term manual resistance is fair to good but she is unable to maintain pressure for durations > 15-20 sec   MMT Right 09/11/2021 Left 09/11/2021  Shoulder flexion 4 4  Shoulder extension 4 4  Shoulder abduction 4 4  Shoulder adduction    Shoulder extension    Shoulder internal rotation    Shoulder external rotation    Middle trapezius    Lower trapezius    Elbow flexion    Elbow extension    Wrist flexion    Wrist extension    Wrist ulnar deviation    Wrist radial deviation    Wrist pronation    Wrist supination    Grip strength     (Blank rows = not tested)  CERVICAL SPECIAL TESTS:  Distraction test: Positive   PATIENT SURVEYS:  FOTO 38%   TODAY'S TREATMENT:  Instruction in supine ex's with 2 pillows on bed    PATIENT EDUCATION:   Education details: supine ex's Person educated: Patient Education method: Explanation, Demonstration, and Handouts Education comprehension: verbalized understanding and returned demonstration   HOME EXERCISE PROGRAM: [email or web address] Access Code: [email or web address] URL: [email or web address] Date: 09/11/2021 Prepared by: Ruben Im  Exercises Supine Shoulder Flexion Extension AAROM with Dowel - 2 x daily - 7 x weekly - 1 sets - 10 reps Supine Shoulder Press - 2 x daily - 7 x weekly - 1 sets - 10 reps Supine Cervical Retraction with Towel - 2 x daily - 7 x weekly - 1 sets - 5 reps   ASSESSMENT:  CLINICAL IMPRESSION: Patient is a 88 y.o. female who was seen today for physical therapy evaluation and treatment for difficulty holding her head up and associated neck pain.  She presents using a RW and wearing a cervical collar.  Her difficulty started in January for no apparent reason and has progressed over time.  She states she can hold her head up for the first 20 min in the morning then after that her head falls forward.   She has difficulty fixing her hair, looking up, reaching the high microwave and for upper cabinets.  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  She would benefit from PT to address ROM, strength deficits and control pain.     OBJECTIVE IMPAIRMENTS pain, decreased functional activity, decreased ROM and decreased strength.   ACTIVITY LIMITATIONS cleaning, community activity, meal prep, and laundry.   PERSONAL FACTORS Age and Time since onset of injury/illness/exacerbation are also affecting patient's functional outcome.    REHAB POTENTIAL: Good  CLINICAL DECISION MAKING: Stable/uncomplicated  EVALUATION COMPLEXITY: Low   GOALS: Goals reviewed with patient? Yes  SHORT TERM GOALS:  The patient will demonstrate compliance with initial HEP and basic strategies to improve postural  alignment  Target date: 10/09/2021 Goal status: INITIAL  2.  The patient will have improved cervical/thoracic and UE mobility to brush the back of her head  Baseline: T  Target date: 10/09/2021 Goal status: INITIAL  3.  The patient will have improved cervical extension to 40 degrees needed to look forward while walking   Target date: 10/09/2021 Goal status: INITIAL  4.  Standing wall to occiput distance with effort improved to 11cm   Target date: 10/09/2021 Goal status: INITIAL   LONG TERM GOALS:  The patient will be independent with home ex program and postural strategies  Target date: 11/06/2021 Goal status: INITIAL  2.  The patient's occiput to wall distance with be 8cm or less  Target date: 11/06/2021 Goal status: INITIAL  3.  Cervical extension ROM improved to 50 degrees needed for reaching into the microwave and top kitchen cabinets  Target date: 11/06/2021 Goal status: INITIAL  4.  FOTO score improved to 55% indicating improved function with less pain Baseline: 38% Target date: 11/06/2021 Goal status: INITIAL   PLAN: PT FREQUENCY: 2x/week  PT DURATION: 8 weeks  PLANNED INTERVENTIONS: Therapeutic exercises, Therapeutic activity, Neuromuscular re-education, Balance training, Gait training, Patient/Family education, Joint mobilization, Aquatic Therapy, Dry Needling, Moist heat, Traction, Ultrasound, and Manual therapy  PLAN FOR NEXT SESSION: the patient has multiple other appts and will call back to schedule appts;  review supine ex's (gravity assisting);  cervical distraction for pain relief, thoracic extension (supine vs seated); start cervical isometrics   Ruben Im, PT 09/11/21 5:39 PM Phone: [phone] Fax: [phone]  Alvera Singh, PT 09/11/2021, 5:30 PM

## 2021-09-12 ENCOUNTER — Encounter: Payer: Medicare Other | Admitting: Physical Medicine and Rehabilitation

## 2021-09-16 ENCOUNTER — Other Ambulatory Visit: Payer: Self-pay

## 2021-09-16 ENCOUNTER — Encounter: Payer: Self-pay | Admitting: Cardiology

## 2021-09-16 ENCOUNTER — Ambulatory Visit (INDEPENDENT_AMBULATORY_CARE_PROVIDER_SITE_OTHER): Payer: Medicare Other | Admitting: Cardiology

## 2021-09-16 VITALS — BP 100/60 | HR 83 | Ht 61.0 in | Wt 124.8 lb

## 2021-09-16 DIAGNOSIS — I4819 Other persistent atrial fibrillation: Secondary | ICD-10-CM | POA: Diagnosis not present

## 2021-09-16 DIAGNOSIS — I5033 Acute on chronic diastolic (congestive) heart failure: Secondary | ICD-10-CM

## 2021-09-16 DIAGNOSIS — I4510 Unspecified right bundle-branch block: Secondary | ICD-10-CM | POA: Diagnosis not present

## 2021-09-16 MED ORDER — POTASSIUM CHLORIDE ER 10 MEQ PO TBCR: 10.0000 meq | EXTENDED_RELEASE_TABLET | Freq: Two times a day (BID) | ORAL | 1 refills | Status: AC

## 2021-09-16 MED ORDER — TORSEMIDE 20 MG PO TABS: 20.0000 mg | ORAL_TABLET | Freq: Every day | ORAL | 3 refills | Status: AC | PRN

## 2021-09-16 NOTE — Patient Instructions (Addendum)
Change torsemide to PRN for swelling or weight gain ? ?Reduce potassium to 2 tablets daily.  ? ? ?

## 2021-09-17 ENCOUNTER — Ambulatory Visit: Payer: Medicare Other

## 2021-09-17 ENCOUNTER — Ambulatory Visit (INDEPENDENT_AMBULATORY_CARE_PROVIDER_SITE_OTHER): Payer: Medicare Other

## 2021-09-17 VITALS — Ht 61.0 in | Wt 124.0 lb

## 2021-09-17 DIAGNOSIS — Z0000 Encounter for general adult medical examination without abnormal findings: Secondary | ICD-10-CM | POA: Diagnosis not present

## 2021-09-17 NOTE — Progress Notes (Signed)
? ?Subjective:  ? Julie Norton is a 88 y.o. female who presents for Medicare Annual (Subsequent) preventive examination. ? ?Review of Systems    ?Virtual Visit via Telephone Note ? ?I connected with  [name] on 09/17/21 at  2:45 PM EDT by telephone and verified that I am speaking with the correct person using two identifiers. ? ?Location: ?Patient: Home  ?Provider: Office ?Persons participating in the virtual visit: patient/Nurse Health Advisor ?  ?I discussed the limitations, risks, security and privacy concerns of performing an evaluation and management service by telephone and the availability of in person appointments. The patient expressed understanding and agreed to proceed. ? ?Interactive audio and video telecommunications were attempted between this nurse and patient, however failed, due to patient having technical difficulties OR patient did not have access to video capability.  We continued and completed visit with audio only. ? ?Some vital signs may be absent or patient reported.  ? ?[name], LPN  ?Cardiac Risk Factors include: advanced age (>71mn, >>39women);diabetes mellitus ? ?   ?Objective:  ?  ?Today's Vitals  ? 09/17/21 1456 09/17/21 1457  ?Weight: 124 lb (56.2 kg)   ?Height: '5\' 1"'$  (1.549 m)   ?PainSc:  3   ? ?Body mass index is 23.43 kg/m?. ? ?Advanced Directives 09/17/2021 09/11/2021 08/28/2021 08/28/2021 08/28/2021 09/11/2020 05/10/2020  ?Does Patient Have a Medical Advance Directive? Yes Yes - Yes No Yes Yes  ?Type of AParamedicof APopeLiving will Living will;Healthcare Power of AProsperityLiving will -  ?Does patient want to make changes to medical advance directive? No - Patient declined - No - Patient declined - - - -  ?Copy of HJenain Chart? No - copy requested - - - - No - copy requested No - copy requested  ?Would patient like information on creating  a medical advance directive? No - Patient declined No - Patient declined - - No - Patient declined - -  ? ? ?Current Medications (verified) ?Outpatient Encounter Medications as of 09/17/2021  ?Medication Sig  ? acetaminophen (TYLENOL) 500 MG tablet Take 1,000 mg by mouth every 6 (six) hours as needed for mild pain or moderate pain.   ? Calcium Carb-Cholecalciferol (CALCIUM+D3 PO) Take 1 tablet by mouth daily with breakfast.  ? Cholecalciferol (VITAMIN D3) 125 MCG (5000 UT) CAPS TAKE ONE CAPSULE BY MOUTH EVERY MORNING  ? dofetilide (TIKOSYN) 125 MCG capsule Take 1 capsule (125 mcg total) by mouth 2 (two) times daily.  ? ELIQUIS 5 MG TABS tablet TAKE ONE TABLET BY MOUTH EVERY MORNING and TAKE ONE TABLET BY MOUTH EVERY EVENING  ? ferrous sulfate 325 (65 FE) MG EC tablet Take 325 mg daily  ? glucose blood (ONE TOUCH ULTRA TEST) test strip Check once daily. E11.40  ? insulin degludec (TRESIBA FLEXTOUCH) 100 UNIT/ML FlexTouch Pen Inject 24 units into THE SKIN ONCE daily AT 10pm (Patient taking differently: 14-18 Units in the morning.)  ? Insulin Pen Needle 29G X 5MM MISC Use once daily  ? levothyroxine (SYNTHROID) 50 MCG tablet TAKE ONE TABLET BY MOUTH BEFORE BREAKFAST  ? lidocaine (LIDODERM) 5 % Place 1 patch onto the skin See admin instructions. Apply 1 patch to the neck every day  ? loratadine (CLARITIN) 10 MG tablet Take 10 mg by mouth daily as needed for allergies.  ? MAGNESIUM PO Take 1 tablet by mouth daily with breakfast.  ?  metFORMIN (GLUCOPHAGE-XR) 500 MG 24 hr tablet Take 1 tablet (500 mg total) by mouth daily.  ? metoprolol succinate (TOPROL-XL) 25 MG 24 hr tablet Take 0.5 tablets (12.5 mg total) by mouth every morning.  ? omeprazole (PRILOSEC) 20 MG capsule Take 1 capsule (20 mg total) by mouth as needed. (Patient taking differently: Take 20 mg by mouth 2 (two) times daily as needed (for reflux or heartburn).)  ? ONE TOUCH ULTRA TEST test strip CHECK ONCE A DAY  ? potassium chloride (KLOR-CON) 10 MEQ tablet  Take 1 tablet (10 mEq total) by mouth 2 (two) times daily.  ? torsemide (DEMADEX) 20 MG tablet Take 1 tablet (20 mg total) by mouth daily as needed. Take daily prn weight gain or swelling  ? vitamin B-12 (CYANOCOBALAMIN) 1000 MCG tablet Take 1 tablet (1,000 mcg total) by mouth daily.  ? ?No facility-administered encounter medications on file as of 09/17/2021.  ? ? ?Allergies (verified) ?Bactrim, Dilaudid [hydromorphone hcl], Hydrocodone, Invokana [canagliflozin], and Macrodantin  ? ?History: ?Past Medical History:  ?Diagnosis Date  ? Abdominal adhesions   ? Abdominal gas pain   ? suspected -- may be related to intermittent right upper quadrant discomfort radiating around her back      ? Anemia   ? Anginal pain ([place])   ? Arthritis   ? Atrial fibrillation ([place])   ? Breast cancer ([place])   ? Breast mass   ? left breast  ? Cancer ([place])   ? left breast  ? Chest pain   ? intermittent right upper quadrant discomfort radiating around her back      ? Complication of anesthesia   ? Diabetes mellitus without complication ([place])   ? Type II  ? Dyspepsia   ? Dyspnea   ? GERD (gastroesophageal reflux disease)   ? H/O: hysterectomy 1978  ? History of cardioversion   ? Hypercholesteremia   ? Hypothyroidism   ? Mitral insufficiency   ? Personal history of radiation therapy   ? Pneumonia   ? PONV (postoperative nausea and vomiting)   ? Right bundle branch block (RBBB)   ? Tendinitis   ? of the right arm and shoulder  ? Torn tendon   ? right shoulder  ? ?Past Surgical History:  ?Procedure Laterality Date  ? ABDOMINAL HYSTERECTOMY    ? APPENDECTOMY  1978  ? BLADDER REPAIR    ? BREAST LUMPECTOMY Left 02/24/2011  ? central partial mastectomy - dr Margot Chimes  ? BREAST REDUCTION SURGERY    ? CARDIOVERSION N/A 10/28/2017  ? Procedure: CARDIOVERSION;  Surgeon: Lelon Perla, MD;  Location[facility] ENDOSCOPY;  Service: Cardiovascular;  Laterality: N/A;  ? CARDIOVERSION N/A 04/06/2019  ? Procedure: CARDIOVERSION;  Surgeon: [name], MD;   Location: [place];  Service: Cardiovascular;  Laterality: N/A;  ? CARDIOVERSION N/A 09/08/2019  ? Procedure: CARDIOVERSION;  Surgeon: Dorothy Spark, MD;  Location: [place];  Service: Cardiovascular;  Laterality: N/A;  ? CARDIOVERSION N/A 08/28/2021  ? Procedure: CARDIOVERSION;  Surgeon: [name], MD;  Location: [facility] ENDOSCOPY;  Service: Cardiovascular;  Laterality: N/A;  ? COLONOSCOPY WITH PROPOFOL N/A 03/24/2017  ? Procedure: COLONOSCOPY WITH PROPOFOL;  Surgeon: Wilford Corner, MD;  Location: [place];  Service: Endoscopy;  Laterality: N/A;  ? ESOPHAGOGASTRODUODENOSCOPY (EGD) WITH PROPOFOL N/A 03/24/2017  ? Procedure: ESOPHAGOGASTRODUODENOSCOPY (EGD) WITH PROPOFOL;  Surgeon: Wilford Corner, MD;  Location: [place];  Service: Endoscopy;  Laterality: N/A;  ? LIPOMA EXCISION Right   ? REDUCTION MAMMAPLASTY Bilateral 1998  ?  RETINAL DETACHMENT SURGERY Left   ? SALPINGOOPHORECTOMY    ? TEE WITHOUT CARDIOVERSION N/A 10/28/2017  ? Procedure: TRANSESOPHAGEAL ECHOCARDIOGRAM (TEE);  Surgeon: Lelon Perla, MD;  Location: [facility] ENDOSCOPY;  Service: Cardiovascular;  Laterality: N/A;  ? [place]  ? at 88 years of age  ? ?Family History  ?Problem Relation Age of Onset  ? Pneumonia Father   ? Heart attack Mother   ? Hypertension Mother   ? Stroke Mother   ? Diabetes Mother   ? Aortic aneurysm Mother   ?     abdominal aortic aneurysm  ? Cancer Mother   ?     bladder  ? Aortic aneurysm Sister   ?     abdominal aortic aneurysm  ? ?Social History  ? ?Socioeconomic History  ? Marital status: Widowed  ?  Spouse name: Not on file  ? Number of children: Not on file  ? Years of education: Not on file  ? Highest education level: Not on file  ?Occupational History  ? Not on file  ?Tobacco Use  ? Smoking status: Never  ? Smokeless tobacco: Never  ?Vaping Use  ? Vaping Use: Never used  ?Substance and Sexual Activity  ? Alcohol use: No  ? Drug use: No  ? Sexual activity: Not on  file  ?Other Topics Concern  ? Not on file  ?Social History Narrative  ? Not on file  ? ?Social Determinants of Health  ? ?Financial Resource Strain: Low Risk   ? Difficulty of Paying Living Expenses: Not

## 2021-09-17 NOTE — Patient Instructions (Addendum)
?Ms. Sesay , ?Thank you for taking time to come for your Medicare Wellness Visit. I appreciate your ongoing commitment to your health goals. Please review the following plan we discussed and let me know if I can assist you in the future.  ? ?These are the goals we discussed: ? Goals   ? ?   Patient Stated (pt-stated)   ?   I would like to Walk without using walker and improve balance also hold my head up when I am walking ?  ? ?  ?  ?This is a list of the screening recommended for you and due dates:  ?Health Maintenance  ?Topic Date Due  ? Urine Protein Check  04/07/2014  ? Complete foot exam   08/29/2015  ? COVID-19 Vaccine (3 - Mixed Product risk series) 10/03/2021*  ? Zoster (Shingles) Vaccine (1 of 2) 12/18/2021*  ? Pneumonia Vaccine (2 - PPSV23 if available, else PCV20) 09/18/2022*  ? Hemoglobin A1C  02/25/2022  ? Eye exam for diabetics  07/31/2022  ? Tetanus Vaccine  08/28/2024  ? Flu Shot  Completed  ? DEXA scan (bone density measurement)  Completed  ? HPV Vaccine  Aged Out  ?*Topic was postponed. The date shown is not the original due date.  ?  ?Advanced directives: Yes Patient will bring copy ? ?Conditions/risks identified: None ? ?Next appointment: Follow up in one year for your annual wellness visit  ? ? ?Preventive Care 30 Years and Older, Female ?Preventive care refers to lifestyle choices and visits with your health care provider that can promote health and wellness. ?What does preventive care include? ?A yearly physical exam. This is also called an annual well check. ?Dental exams once or twice a year. ?Routine eye exams. Ask your health care provider how often you should have your eyes checked. ?Personal lifestyle choices, including: ?Daily care of your teeth and gums. ?Regular physical activity. ?Eating a healthy diet. ?Avoiding tobacco and drug use. ?Limiting alcohol use. ?Practicing safe sex. ?Taking low-dose aspirin every day. ?Taking vitamin and mineral supplements as recommended by your  health care provider. ?What happens during an annual well check? ?The services and screenings done by your health care provider during your annual well check will depend on your age, overall health, lifestyle risk factors, and family history of disease. ?Counseling  ?Your health care provider may ask you questions about your: ?Alcohol use. ?Tobacco use. ?Drug use. ?Emotional well-being. ?Home and relationship well-being. ?Sexual activity. ?Eating habits. ?History of falls. ?Memory and ability to understand (cognition). ?Work and work Statistician. ?Reproductive health. ?Screening  ?You may have the following tests or measurements: ?Height, weight, and BMI. ?Blood pressure. ?Lipid and cholesterol levels. These may be checked every 5 years, or more frequently if you are over 42 years old. ?Skin check. ?Lung cancer screening. You may have this screening every year starting at age 31 if you have a 30-pack-year history of smoking and currently smoke or have quit within the past 15 years. ?Fecal occult blood test (FOBT) of the stool. You may have this test every year starting at age 43. ?Flexible sigmoidoscopy or colonoscopy. You may have a sigmoidoscopy every 5 years or a colonoscopy every 10 years starting at age 77. ?Hepatitis C blood test. ?Hepatitis B blood test. ?Sexually transmitted disease (STD) testing. ?Diabetes screening. This is done by checking your blood sugar (glucose) after you have not eaten for a while (fasting). You may have this done every 1-3 years. ?Bone density scan. This is done to  screen for osteoporosis. You may have this done starting at age 45. ?Mammogram. This may be done every 1-2 years. Talk to your health care provider about how often you should have regular mammograms. ?Talk with your health care provider about your test results, treatment options, and if necessary, the need for more tests. ?Vaccines  ?Your health care provider may recommend certain vaccines, such as: ?Influenza vaccine.  This is recommended every year. ?Tetanus, diphtheria, and acellular pertussis (Tdap, Td) vaccine. You may need a Td booster every 10 years. ?Zoster vaccine. You may need this after age 41. ?Pneumococcal 13-valent conjugate (PCV13) vaccine. One dose is recommended after age 9. ?Pneumococcal polysaccharide (PPSV23) vaccine. One dose is recommended after age 53. ?Talk to your health care provider about which screenings and vaccines you need and how often you need them. ?This information is not intended to replace advice given to you by your health care provider. Make sure you discuss any questions you have with your health care provider. ?Document Released: 07/19/2015 Document Revised: 03/11/2016 Document Reviewed: 04/23/2015 ?Elsevier Interactive Patient Education ? 2017 [place]. ? ?Fall Prevention in the Home ?Falls can cause injuries. They can happen to people of all ages. There are many things you can do to make your home safe and to help prevent falls. ?What can I do on the outside of my home? ?Regularly fix the edges of walkways and driveways and fix any cracks. ?Remove anything that might make you trip as you walk through a door, such as a raised step or threshold. ?Trim any bushes or trees on the path to your home. ?Use bright outdoor lighting. ?Clear any walking paths of anything that might make someone trip, such as rocks or tools. ?Regularly check to see if handrails are loose or broken. Make sure that both sides of any steps have handrails. ?Any raised decks and porches should have guardrails on the edges. ?Have any leaves, snow, or ice cleared regularly. ?Use sand or salt on walking paths during winter. ?Clean up any spills in your garage right away. This includes oil or grease spills. ?What can I do in the bathroom? ?Use night lights. ?Install grab bars by the toilet and in the tub and shower. Do not use towel bars as grab bars. ?Use non-skid mats or decals in the tub or shower. ?If you need to sit  down in the shower, use a plastic, non-slip stool. ?Keep the floor dry. Clean up any water that spills on the floor as soon as it happens. ?Remove soap buildup in the tub or shower regularly. ?Attach bath mats securely with double-sided non-slip rug tape. ?Do not have throw rugs and other things on the floor that can make you trip. ?What can I do in the bedroom? ?Use night lights. ?Make sure that you have a light by your bed that is easy to reach. ?Do not use any sheets or blankets that are too big for your bed. They should not hang down onto the floor. ?Have a firm chair that has side arms. You can use this for support while you get dressed. ?Do not have throw rugs and other things on the floor that can make you trip. ?What can I do in the kitchen? ?Clean up any spills right away. ?Avoid walking on wet floors. ?Keep items that you use a lot in easy-to-reach places. ?If you need to reach something above you, use a strong step stool that has a grab bar. ?Keep electrical cords out of the way. ?  Do not use floor polish or wax that makes floors slippery. If you must use wax, use non-skid floor wax. ?Do not have throw rugs and other things on the floor that can make you trip. ?What can I do with my stairs? ?Do not leave any items on the stairs. ?Make sure that there are handrails on both sides of the stairs and use them. Fix handrails that are broken or loose. Make sure that handrails are as long as the stairways. ?Check any carpeting to make sure that it is firmly attached to the stairs. Fix any carpet that is loose or worn. ?Avoid having throw rugs at the top or bottom of the stairs. If you do have throw rugs, attach them to the floor with carpet tape. ?Make sure that you have a light switch at the top of the stairs and the bottom of the stairs. If you do not have them, ask someone to add them for you. ?What else can I do to help prevent falls? ?Wear shoes that: ?Do not have high heels. ?Have rubber bottoms. ?Are  comfortable and fit you well. ?Are closed at the toe. Do not wear sandals. ?If you use a stepladder: ?Make sure that it is fully opened. Do not climb a closed stepladder. ?Make sure that both sides of the stepla

## 2021-09-18 ENCOUNTER — Ambulatory Visit: Payer: Medicare Other | Admitting: Rehabilitative and Restorative Service Providers"

## 2021-09-18 ENCOUNTER — Encounter: Payer: Self-pay | Admitting: Rehabilitative and Restorative Service Providers"

## 2021-09-18 ENCOUNTER — Other Ambulatory Visit: Payer: Self-pay

## 2021-09-18 DIAGNOSIS — R293 Abnormal posture: Secondary | ICD-10-CM | POA: Diagnosis not present

## 2021-09-18 DIAGNOSIS — M542 Cervicalgia: Secondary | ICD-10-CM | POA: Diagnosis not present

## 2021-09-18 DIAGNOSIS — M6281 Muscle weakness (generalized): Secondary | ICD-10-CM | POA: Diagnosis not present

## 2021-09-18 NOTE — Therapy (Signed)
?OUTPATIENT PHYSICAL THERAPY TREATMENT NOTE ? ? ?Patient Name: Julie Norton ?MRN: [id number] ?DOB:09-25-1929, 88 y.o., female ?Today's Date: 09/18/2021 ? ?PCP: Eulas Post, MD ?REFERRING PROVIDER: Susa Day, MD ? ? PT End of Session - 09/18/21 1025   ? ? Visit Number 2   ? Date for PT Re-Evaluation 11/06/21   ? Authorization Type [employer] Medicare   ? PT Start Time 1020   ? PT Stop Time 1100   ? PT Time Calculation (min) 40 min   ? Activity Tolerance Patient tolerated treatment well   ? Behavior During Therapy [facility] for tasks assessed/performed   ? ?  ?  ? ?  ? ? ?Past Medical History:  ?Diagnosis Date  ? Abdominal adhesions   ? Abdominal gas pain   ? suspected -- may be related to intermittent right upper quadrant discomfort radiating around her back      ? Anemia   ? Anginal pain ([place])   ? Arthritis   ? Atrial fibrillation ([place])   ? Breast cancer ([place])   ? Breast mass   ? left breast  ? Cancer ([place])   ? left breast  ? Chest pain   ? intermittent right upper quadrant discomfort radiating around her back      ? Complication of anesthesia   ? Diabetes mellitus without complication ([place])   ? Type II  ? Dyspepsia   ? Dyspnea   ? GERD (gastroesophageal reflux disease)   ? H/O: hysterectomy 1978  ? History of cardioversion   ? Hypercholesteremia   ? Hypothyroidism   ? Mitral insufficiency   ? Personal history of radiation therapy   ? Pneumonia   ? PONV (postoperative nausea and vomiting)   ? Right bundle branch block (RBBB)   ? Tendinitis   ? of the right arm and shoulder  ? Torn tendon   ? right shoulder  ? ?Past Surgical History:  ?Procedure Laterality Date  ? ABDOMINAL HYSTERECTOMY    ? APPENDECTOMY  1978  ? BLADDER REPAIR    ? BREAST LUMPECTOMY Left 02/24/2011  ? central partial mastectomy - dr Margot Chimes  ? BREAST REDUCTION SURGERY    ? CARDIOVERSION N/A 10/28/2017  ? Procedure: CARDIOVERSION;  Surgeon: Lelon Perla, MD;  Location: [facility] ENDOSCOPY;  Service: Cardiovascular;  Laterality: N/A;  ? CARDIOVERSION N/A  04/06/2019  ? Procedure: CARDIOVERSION;  Surgeon: [name], MD;  Location: [place];  Service: Cardiovascular;  Laterality: N/A;  ? CARDIOVERSION N/A 09/08/2019  ? Procedure: CARDIOVERSION;  Surgeon: Dorothy Spark, MD;  Location: [place];  Service: Cardiovascular;  Laterality: N/A;  ? CARDIOVERSION N/A 08/28/2021  ? Procedure: CARDIOVERSION;  Surgeon: [name], MD;  Location[facility] ENDOSCOPY;  Service: Cardiovascular;  Laterality: N/A;  ? COLONOSCOPY WITH PROPOFOL N/A 03/24/2017  ? Procedure: COLONOSCOPY WITH PROPOFOL;  Surgeon: Wilford Corner, MD;  Location: [place];  Service: Endoscopy;  Laterality: N/A;  ? ESOPHAGOGASTRODUODENOSCOPY (EGD) WITH PROPOFOL N/A 03/24/2017  ? Procedure: ESOPHAGOGASTRODUODENOSCOPY (EGD) WITH PROPOFOL;  Surgeon: Wilford Corner, MD;  Location: [place];  Service: Endoscopy;  Laterality: N/A;  ? LIPOMA EXCISION Right   ? REDUCTION MAMMAPLASTY Bilateral 1998  ? RETINAL DETACHMENT SURGERY Left   ? SALPINGOOPHORECTOMY    ? TEE WITHOUT CARDIOVERSION N/A 10/28/2017  ? Procedure: TRANSESOPHAGEAL ECHOCARDIOGRAM (TEE);  Surgeon: Lelon Perla, MD;  Location: [facility] ENDOSCOPY;  Service: Cardiovascular;  Laterality: N/A;  ? [place]  ? at 88 years of age  ? ?Patient Active  Problem List  ? Diagnosis Date Noted  ? Secondary hypercoagulable state ([place]) 09/05/2021  ? Persistent atrial fibrillation ([place]) 08/26/2021  ? Chronic insomnia 01/21/2021  ? Degeneration of lumbar intervertebral disc 02/03/2019  ? GERD (gastroesophageal reflux disease) 11/30/2017  ? Other persistent atrial fibrillation ([place])   ? Injury of muscle of rotator cuff 10/12/2017  ? Iron deficiency anemia, unspecified 03/24/2017  ? Poorly controlled type 2 diabetes mellitus ([place]) 02/23/2017  ? Mitral insufficiency 02/04/2016  ? Right bundle branch block 05/16/2015  ? Chest pain of unknown etiology 05/16/2015  ? Peripheral neuropathy 08/23/2014  ? Heart murmur  previously undiagnosed 01/22/2014  ? Abdominal pain 11/11/2011  ? Ductal carcinoma in situ (DCIS) of left breast 01/12/2011  ? Hypercholesterolemia 11/28/2010  ? Hypothyroidism 11/28/2010  ? Postmenopausal state 11/28/2010  ? Osteoarthritis 11/28/2010  ? Dyspepsia 11/28/2010  ? ? ?REFERRING DIAG: Cervicalgia ? ?THERAPY DIAG:  ?Cervicalgia ? ?Abnormal posture ? ?Muscle weakness (generalized) ? ?PERTINENT HISTORY: AFib, osteopenia; neuropathy; diabetes, OA neck, scoliosis; husband passed away in 01-Mar-2023 married 33 years; dtr [name] brought her today since she is unable to drive ? ?PRECAUTIONS: Fall ? ?SUBJECTIVE: Pt reports having low blood sugar this morning of 92.  Pt reports eating breakfast and is feeling better now.  Grandson present during initial part of session. ? ?PAIN:  ?Are you having pain? Yes: NPRS scale: 3/10 ?Pain location: cervical ?Pain description: dull in the AM ?Aggravating factors: reports worse pain at night ?Relieving factors: better in the AM ? ? ?PATIENT GOALS: wants to continue working; spend time with family friends ? ? ?OBJECTIVE:  ?  ?DIAGNOSTIC FINDINGS:  ?Arthritis in neck;no fracture per Dr. Tonita Cong ?  ?PATIENT SURVEYS:  ?FOTO 38% on 09/11/2021 ?  ?  ?COGNITION: ?Overall cognitive status: Within functional limits for tasks assessed ?           ?  ?POSTURE:  ?09/11/2021: At rest head flexed 25 degrees flexion in sitting;  standing against wall at rest occiput 21cm from wall, with effort able to bring occiput 15cm from wall but unable to maintain 5 sec before head drops forward; inc thoracic kyphosis; right scoliosis  ?  ?PALPATION: ?Tender points in bil suboccipitals, upper traps   ?  ?CERVICAL AROM/PROM ?  ?A/PROM A/PROM (deg) ?09/11/2021 A/ROM (deg)  ?09/18/2021  ?Flexion     ?Extension 33 ?  38  ?Right lateral flexion Unable to keep head in neutral for sidebend   ?Left lateral flexion     ?Right rotation 40 ?  45  ?Left rotation 35 40  ? (Blank rows = not tested) ?  ?UE AROM/PROM: ?  ?A/PROM  Right ?09/11/2021 Left ?09/11/2021  ?Shoulder flexion 120 in sitting; 150 in supine 120 in sitting; 150 in supine  ?Shoulder extension      ?Shoulder abduction      ?Shoulder adduction      ?Shoulder extension      ?Shoulder internal rotation WFLs WFLs  ?Shoulder external rotation WFLs WFLs  ?Elbow flexion      ?Elbow extension      ?Wrist flexion      ?Wrist extension      ?Wrist ulnar deviation      ?Wrist radial deviation      ?Wrist pronation      ?Wrist supination      ? (Blank rows = not tested) ?  ?UE MMT:  Cervical extensor strength for short term manual resistance is fair to good but she is unable to  maintain pressure for durations > 15-20 sec  ?  ?MMT Right ?09/11/2021 Left ?09/11/2021  ?Shoulder flexion 4 4  ?Shoulder extension 4 4  ?Shoulder abduction 4 4  ?Shoulder adduction      ?Shoulder extension      ?Shoulder internal rotation      ?Shoulder external rotation      ?Middle trapezius      ?Lower trapezius      ?Elbow flexion      ?Elbow extension      ?Wrist flexion      ?Wrist extension      ?Wrist ulnar deviation      ?Wrist radial deviation      ?Wrist pronation      ?Wrist supination      ?Grip strength      ? (Blank rows = not tested) ?  ?CERVICAL SPECIAL TESTS:  ?Distraction test: Positive ?  ?  ?PATIENT SURVEYS:  ?FOTO 38%  ?  ?TODAY'S TREATMENT:  ? ?09/18/2021: ?Supine shoulder AA/ROM with cane 2x10 ?Supine cervical retraction into pillow 2x10 ?Supine shoulder press 2x10 ?Manual therapy with soft tissue mobilization with suboccipital release and manual trigger point release to trigger point noted L cervical paraspinal, gentle cervical manual traction with 10 sec hold x5. ?Supine AROM:  cervical rotation 2x10 ?Supine shoulder ER and horizontal abduction with yellow theraband 2x10 bilat ?Seated:  Cervical flex/extension A/ROM, scapular retraction, shoulder rolls, 1# bicep curls, 1# shoulder flexion. 2x10 each bilat ? ?09/11/2021: ?Instruction in supine ex's with 2 pillows on bed  ?  ?  ?PATIENT EDUCATION:   ?Education details: supine ex's ?Person educated: Patient ?Education method: Explanation, Demonstration, and Handouts ?Education comprehension: verbalized understanding and returned demonstration ?  ?  ?HOM

## 2021-09-19 ENCOUNTER — Other Ambulatory Visit (HOSPITAL_COMMUNITY): Payer: Self-pay

## 2021-09-23 ENCOUNTER — Ambulatory Visit: Payer: Medicare Other

## 2021-09-23 ENCOUNTER — Other Ambulatory Visit: Payer: Self-pay

## 2021-09-23 DIAGNOSIS — R293 Abnormal posture: Secondary | ICD-10-CM

## 2021-09-23 DIAGNOSIS — M6281 Muscle weakness (generalized): Secondary | ICD-10-CM | POA: Diagnosis not present

## 2021-09-23 DIAGNOSIS — M542 Cervicalgia: Secondary | ICD-10-CM | POA: Diagnosis not present

## 2021-09-23 NOTE — Therapy (Signed)
?OUTPATIENT PHYSICAL THERAPY TREATMENT NOTE ? ? ?Patient Name: Julie Norton ?MRN: [id number] ?DOB:10/06/1929, 88 y.o., female ?Today's Date: 09/23/2021 ? ?PCP: Eulas Post, MD ?REFERRING PROVIDER: Susa Day, MD ? ? PT End of Session - 09/23/21 1150   ? ? Visit Number 3   ? Date for PT Re-Evaluation 11/06/21   ? Authorization Type UHC Medicare   ? PT Start Time 1148   ? PT Stop Time 1230   ? PT Time Calculation (min) 42 min   ? Activity Tolerance Patient tolerated treatment well   ? Behavior During Therapy [facility] for tasks assessed/performed   ? ?  ?  ? ?  ? ? ?Past Medical History:  ?Diagnosis Date  ? Abdominal adhesions   ? Abdominal gas pain   ? suspected -- may be related to intermittent right upper quadrant discomfort radiating around her back      ? Anemia   ? Anginal pain ([place])   ? Arthritis   ? Atrial fibrillation ([place])   ? Breast cancer ([place])   ? Breast mass   ? left breast  ? Cancer ([place])   ? left breast  ? Chest pain   ? intermittent right upper quadrant discomfort radiating around her back      ? Complication of anesthesia   ? Diabetes mellitus without complication ([place])   ? Type II  ? Dyspepsia   ? Dyspnea   ? GERD (gastroesophageal reflux disease)   ? H/O: hysterectomy 1978  ? History of cardioversion   ? Hypercholesteremia   ? Hypothyroidism   ? Mitral insufficiency   ? Personal history of radiation therapy   ? Pneumonia   ? PONV (postoperative nausea and vomiting)   ? Right bundle branch block (RBBB)   ? Tendinitis   ? of the right arm and shoulder  ? Torn tendon   ? right shoulder  ? ?Past Surgical History:  ?Procedure Laterality Date  ? ABDOMINAL HYSTERECTOMY    ? APPENDECTOMY  1978  ? BLADDER REPAIR    ? BREAST LUMPECTOMY Left 02/24/2011  ? central partial mastectomy - dr Margot Chimes  ? BREAST REDUCTION SURGERY    ? CARDIOVERSION N/A 10/28/2017  ? Procedure: CARDIOVERSION;  Surgeon: Lelon Perla, MD;  Location: [facility] ENDOSCOPY;  Service: Cardiovascular;  Laterality: N/A;  ? CARDIOVERSION N/A  04/06/2019  ? Procedure: CARDIOVERSION;  Surgeon: [name], MD;  Location: [place];  Service: Cardiovascular;  Laterality: N/A;  ? CARDIOVERSION N/A 09/08/2019  ? Procedure: CARDIOVERSION;  Surgeon: Dorothy Spark, MD;  Location: [place];  Service: Cardiovascular;  Laterality: N/A;  ? CARDIOVERSION N/A 08/28/2021  ? Procedure: CARDIOVERSION;  Surgeon: [name], MD;  Location: [facility] ENDOSCOPY;  Service: Cardiovascular;  Laterality: N/A;  ? COLONOSCOPY WITH PROPOFOL N/A 03/24/2017  ? Procedure: COLONOSCOPY WITH PROPOFOL;  Surgeon: Wilford Corner, MD;  Location: [place];  Service: Endoscopy;  Laterality: N/A;  ? ESOPHAGOGASTRODUODENOSCOPY (EGD) WITH PROPOFOL N/A 03/24/2017  ? Procedure: ESOPHAGOGASTRODUODENOSCOPY (EGD) WITH PROPOFOL;  Surgeon: Wilford Corner, MD;  Location: [place];  Service: Endoscopy;  Laterality: N/A;  ? LIPOMA EXCISION Right   ? REDUCTION MAMMAPLASTY Bilateral 1998  ? RETINAL DETACHMENT SURGERY Left   ? SALPINGOOPHORECTOMY    ? TEE WITHOUT CARDIOVERSION N/A 10/28/2017  ? Procedure: TRANSESOPHAGEAL ECHOCARDIOGRAM (TEE);  Surgeon: Lelon Perla, MD;  Location: [facility] ENDOSCOPY;  Service: Cardiovascular;  Laterality: N/A;  ? [place]  ? at 88 years of age  ? ?Patient Active  Problem List  ? Diagnosis Date Noted  ? Secondary hypercoagulable state ([place]) 09/05/2021  ? Persistent atrial fibrillation ([place]) 08/26/2021  ? Chronic insomnia 01/21/2021  ? Degeneration of lumbar intervertebral disc 02/03/2019  ? GERD (gastroesophageal reflux disease) 11/30/2017  ? Other persistent atrial fibrillation ([place])   ? Injury of muscle of rotator cuff 10/12/2017  ? Iron deficiency anemia, unspecified 03/24/2017  ? Poorly controlled type 2 diabetes mellitus ([place]) 02/23/2017  ? Mitral insufficiency 02/04/2016  ? Right bundle branch block 05/16/2015  ? Chest pain of unknown etiology 05/16/2015  ? Peripheral neuropathy 08/23/2014  ? Heart murmur  previously undiagnosed 01/22/2014  ? Abdominal pain 11/11/2011  ? Ductal carcinoma in situ (DCIS) of left breast 01/12/2011  ? Hypercholesterolemia 11/28/2010  ? Hypothyroidism 11/28/2010  ? Postmenopausal state 11/28/2010  ? Osteoarthritis 11/28/2010  ? Dyspepsia 11/28/2010  ? ? ?REFERRING DIAG: Cervicalgia ? ?THERAPY DIAG:  ?Cervicalgia ? ?Abnormal posture ? ?Muscle weakness (generalized) ? ?PERTINENT HISTORY: AFib, osteopenia; neuropathy; diabetes, OA neck, scoliosis; husband passed away in 2023/03/14 married 24 years; dtr [name] brought her today since she is unable to drive ? ?PRECAUTIONS: Fall ? ?SUBJECTIVE: Pt reports still having low blood sugar this morning of 92.  Pt reports eating ok.  "I don't eat a lot" ? ?PAIN:  ?Are you having pain? Yes: NPRS scale: 1/10 ?Pain location: cervical ?Pain description: dull in the AM ?Aggravating factors: reports worse pain at night ?Relieving factors: better in the AM ? ? ?PATIENT GOALS: wants to continue working; spend time with family friends ? ? ?OBJECTIVE:  ?  ?DIAGNOSTIC FINDINGS:  ?Arthritis in neck;no fracture per Dr. Tonita Cong ?  ?PATIENT SURVEYS:  ?FOTO 38% on 09/11/2021 ?  ?  ?COGNITION: ?Overall cognitive status: Within functional limits for tasks assessed ?           ?  ?POSTURE:  ?09/11/2021: At rest head flexed 25 degrees flexion in sitting;  standing against wall at rest occiput 21cm from wall, with effort able to bring occiput 15cm from wall but unable to maintain 5 sec before head drops forward; inc thoracic kyphosis; right scoliosis  ?  ?PALPATION: ?Tender points in bil suboccipitals, upper traps   ?  ?CERVICAL AROM/PROM ?  ?A/PROM A/PROM (deg) ?09/11/2021 A/ROM (deg)  ?09/18/2021  ?Flexion     ?Extension 33 ?  38  ?Right lateral flexion Unable to keep head in neutral for sidebend   ?Left lateral flexion     ?Right rotation 40 ?  45  ?Left rotation 35 40  ? (Blank rows = not tested) ?  ?UE AROM/PROM: ?  ?A/PROM Right ?09/11/2021 Left ?09/11/2021  ?Shoulder flexion 120 in  sitting; 150 in supine 120 in sitting; 150 in supine  ?Shoulder extension      ?Shoulder abduction      ?Shoulder adduction      ?Shoulder extension      ?Shoulder internal rotation WFLs WFLs  ?Shoulder external rotation WFLs WFLs  ?Elbow flexion      ?Elbow extension      ?Wrist flexion      ?Wrist extension      ?Wrist ulnar deviation      ?Wrist radial deviation      ?Wrist pronation      ?Wrist supination      ? (Blank rows = not tested) ?  ?UE MMT:  Cervical extensor strength for short term manual resistance is fair to good but she is unable to maintain pressure for durations > 15-20  sec  ?  ?MMT Right ?09/11/2021 Left ?09/11/2021  ?Shoulder flexion 4 4  ?Shoulder extension 4 4  ?Shoulder abduction 4 4  ?Shoulder adduction      ?Shoulder extension      ?Shoulder internal rotation      ?Shoulder external rotation      ?Middle trapezius      ?Lower trapezius      ?Elbow flexion      ?Elbow extension      ?Wrist flexion      ?Wrist extension      ?Wrist ulnar deviation      ?Wrist radial deviation      ?Wrist pronation      ?Wrist supination      ?Grip strength      ? (Blank rows = not tested) ?  ?CERVICAL SPECIAL TESTS:  ?Distraction test: Positive ?  ?  ?PATIENT SURVEYS:  ?FOTO 38%  ?  ?TODAY'S TREATMENT:  ? ?09/23/2021: ?Supine shoulder AA/ROM with cane 2x10 ?Supine cervical retraction into pillow 2x10 ?Supine shoulder press 2x10 ?Manual therapy with soft tissue mobilization with suboccipital release and manual trigger point release to trigger point noted L cervical paraspinal, gentle cervical manual traction with 10 sec hold x5. ?Supine pec stretch: hands together at 90 degrees of flexion, then let arms fall to side for pec stretch x 10 hold 5 sec each ?Seated:  Cervical flex/extension A/ROM, scapular retraction, shoulder rolls, 1# bicep curls, 1# shoulder flexion (patient had to do 4 sets of 5 on shoulder flexion today). 2x10 each bilat ?Yellow tband rows 2 x 10 (seated) ?Yellow tband bilateral ER   (seated) ?Yellow tband horizontal abduction (seated 4 sets of 5) ?09/18/2021: ?Supine shoulder AA/ROM with cane 2x10 ?Supine cervical retraction into pillow 2x10 ?Supine shoulder press 2x10 ?Manual therapy with soft tissue

## 2021-09-25 ENCOUNTER — Encounter: Payer: Self-pay | Admitting: Rehabilitative and Restorative Service Providers"

## 2021-09-25 ENCOUNTER — Other Ambulatory Visit: Payer: Self-pay

## 2021-09-25 ENCOUNTER — Ambulatory Visit: Payer: Medicare Other | Admitting: Rehabilitative and Restorative Service Providers"

## 2021-09-25 DIAGNOSIS — M542 Cervicalgia: Secondary | ICD-10-CM

## 2021-09-25 DIAGNOSIS — M6281 Muscle weakness (generalized): Secondary | ICD-10-CM | POA: Diagnosis not present

## 2021-09-25 DIAGNOSIS — R293 Abnormal posture: Secondary | ICD-10-CM | POA: Diagnosis not present

## 2021-09-25 NOTE — Therapy (Signed)
?OUTPATIENT PHYSICAL THERAPY TREATMENT NOTE ? ? ?Patient Name: Julie Norton ?MRN: [id number] ?DOB:22-Dec-1929, 88 y.o., female ?Today's Date: 09/25/2021 ? ?PCP: Eulas Post, MD ?REFERRING PROVIDER: Susa Day, MD ? ? PT End of Session - 09/25/21 1022   ? ? Visit Number 4   ? Date for PT Re-Evaluation 11/06/21   ? Authorization Type UHC Medicare   ? PT Start Time 1015   ? PT Stop Time 1055   ? PT Time Calculation (min) 40 min   ? Activity Tolerance Patient tolerated treatment well   ? Behavior During Therapy [facility] for tasks assessed/performed   ? ?  ?  ? ?  ? ? ?Past Medical History:  ?Diagnosis Date  ? Abdominal adhesions   ? Abdominal gas pain   ? suspected -- may be related to intermittent right upper quadrant discomfort radiating around her back      ? Anemia   ? Anginal pain ([place])   ? Arthritis   ? Atrial fibrillation ([place])   ? Breast cancer ([place])   ? Breast mass   ? left breast  ? Cancer ([place])   ? left breast  ? Chest pain   ? intermittent right upper quadrant discomfort radiating around her back      ? Complication of anesthesia   ? Diabetes mellitus without complication ([place])   ? Type II  ? Dyspepsia   ? Dyspnea   ? GERD (gastroesophageal reflux disease)   ? H/O: hysterectomy 1978  ? History of cardioversion   ? Hypercholesteremia   ? Hypothyroidism   ? Mitral insufficiency   ? Personal history of radiation therapy   ? Pneumonia   ? PONV (postoperative nausea and vomiting)   ? Right bundle branch block (RBBB)   ? Tendinitis   ? of the right arm and shoulder  ? Torn tendon   ? right shoulder  ? ?Past Surgical History:  ?Procedure Laterality Date  ? ABDOMINAL HYSTERECTOMY    ? APPENDECTOMY  1978  ? BLADDER REPAIR    ? BREAST LUMPECTOMY Left 02/24/2011  ? central partial mastectomy - dr Margot Chimes  ? BREAST REDUCTION SURGERY    ? CARDIOVERSION N/A 10/28/2017  ? Procedure: CARDIOVERSION;  Surgeon: Lelon Perla, MD;  Location: [facility] ENDOSCOPY;  Service: Cardiovascular;  Laterality: N/A;  ? CARDIOVERSION N/A  04/06/2019  ? Procedure: CARDIOVERSION;  Surgeon: [name], MD;  Location: [place];  Service: Cardiovascular;  Laterality: N/A;  ? CARDIOVERSION N/A 09/08/2019  ? Procedure: CARDIOVERSION;  Surgeon: Dorothy Spark, MD;  Location: [place];  Service: Cardiovascular;  Laterality: N/A;  ? CARDIOVERSION N/A 08/28/2021  ? Procedure: CARDIOVERSION;  Surgeon: [name], MD;  Location: [facility] ENDOSCOPY;  Service: Cardiovascular;  Laterality: N/A;  ? COLONOSCOPY WITH PROPOFOL N/A 03/24/2017  ? Procedure: COLONOSCOPY WITH PROPOFOL;  Surgeon: Wilford Corner, MD;  Location: [place];  Service: Endoscopy;  Laterality: N/A;  ? ESOPHAGOGASTRODUODENOSCOPY (EGD) WITH PROPOFOL N/A 03/24/2017  ? Procedure: ESOPHAGOGASTRODUODENOSCOPY (EGD) WITH PROPOFOL;  Surgeon: Wilford Corner, MD;  Location: [place];  Service: Endoscopy;  Laterality: N/A;  ? LIPOMA EXCISION Right   ? REDUCTION MAMMAPLASTY Bilateral 1998  ? RETINAL DETACHMENT SURGERY Left   ? SALPINGOOPHORECTOMY    ? TEE WITHOUT CARDIOVERSION N/A 10/28/2017  ? Procedure: TRANSESOPHAGEAL ECHOCARDIOGRAM (TEE);  Surgeon: Lelon Perla, MD;  Location: [facility] ENDOSCOPY;  Service: Cardiovascular;  Laterality: N/A;  ? [place]  ? at 88 years of age  ? ?Patient Active  Problem List  ? Diagnosis Date Noted  ? Secondary hypercoagulable state ([place]) 09/05/2021  ? Persistent atrial fibrillation ([place]) 08/26/2021  ? Chronic insomnia 01/21/2021  ? Degeneration of lumbar intervertebral disc 02/03/2019  ? GERD (gastroesophageal reflux disease) 11/30/2017  ? Other persistent atrial fibrillation ([place])   ? Injury of muscle of rotator cuff 10/12/2017  ? Iron deficiency anemia, unspecified 03/24/2017  ? Poorly controlled type 2 diabetes mellitus ([place]) 02/23/2017  ? Mitral insufficiency 02/04/2016  ? Right bundle branch block 05/16/2015  ? Chest pain of unknown etiology 05/16/2015  ? Peripheral neuropathy 08/23/2014  ? Heart murmur  previously undiagnosed 01/22/2014  ? Abdominal pain 11/11/2011  ? Ductal carcinoma in situ (DCIS) of left breast 01/12/2011  ? Hypercholesterolemia 11/28/2010  ? Hypothyroidism 11/28/2010  ? Postmenopausal state 11/28/2010  ? Osteoarthritis 11/28/2010  ? Dyspepsia 11/28/2010  ? ? ?REFERRING DIAG: Cervicalgia ? ?THERAPY DIAG:  ?Cervicalgia ? ?Abnormal posture ? ?Muscle weakness (generalized) ? ?PERTINENT HISTORY: AFib, osteopenia; neuropathy; diabetes, OA neck, scoliosis; husband passed away in 03-06-2023 married 31 years; dtr [name] brought her today since she is unable to drive ? ?PRECAUTIONS: Fall ? ?SUBJECTIVE: Pt reports having some low blood sugar this morning, but she is feeling better.  Pt states cervical muscle fatigue, but states pain has decreased. ? ?PAIN:  ?Are you having pain? Yes: NPRS scale: 1/10 ?Pain location: cervical ?Pain description: dull in the AM ?Aggravating factors: reports worse pain at night ?Relieving factors: better in the AM ? ? ?PATIENT GOALS: wants to continue working; spend time with family friends ? ? ?OBJECTIVE:  ?  ?DIAGNOSTIC FINDINGS:  ?Arthritis in neck;no fracture per Dr. Tonita Cong ?  ?PATIENT SURVEYS:  ?FOTO 38% on 09/11/2021 ?  ?  ?COGNITION: ?Overall cognitive status: Within functional limits for tasks assessed ?           ?  ?POSTURE:  ?09/11/2021: At rest head flexed 25 degrees flexion in sitting;  standing against wall at rest occiput 21cm from wall, with effort able to bring occiput 15cm from wall but unable to maintain 5 sec before head drops forward; inc thoracic kyphosis; right scoliosis  ?  ?PALPATION: ?Tender points in bil suboccipitals, upper traps   ?  ?CERVICAL AROM/PROM ?  ?A/PROM A/PROM (deg) ?09/11/2021 A/ROM (deg)  ?09/18/2021  ?Flexion     ?Extension 33 ?  38  ?Right lateral flexion Unable to keep head in neutral for sidebend   ?Left lateral flexion     ?Right rotation 40 ?  45  ?Left rotation 35 40  ? (Blank rows = not tested) ?  ?UE AROM/PROM: ?  ?A/PROM Right ?09/11/2021  Left ?09/11/2021  ?Shoulder flexion 120 in sitting; 150 in supine 120 in sitting; 150 in supine  ?Shoulder extension      ?Shoulder abduction      ?Shoulder adduction      ?Shoulder extension      ?Shoulder internal rotation WFLs WFLs  ?Shoulder external rotation WFLs WFLs  ?Elbow flexion      ?Elbow extension      ?Wrist flexion      ?Wrist extension      ?Wrist ulnar deviation      ?Wrist radial deviation      ?Wrist pronation      ?Wrist supination      ? (Blank rows = not tested) ?  ?UE MMT:  Cervical extensor strength for short term manual resistance is fair to good but she is unable to maintain pressure for  durations > 15-20 sec  ?  ?MMT Right ?09/11/2021 Left ?09/11/2021  ?Shoulder flexion 4 4  ?Shoulder extension 4 4  ?Shoulder abduction 4 4  ?Shoulder adduction      ?Shoulder extension      ?Shoulder internal rotation      ?Shoulder external rotation      ?Middle trapezius      ?Lower trapezius      ?Elbow flexion      ?Elbow extension      ?Wrist flexion      ?Wrist extension      ?Wrist ulnar deviation      ?Wrist radial deviation      ?Wrist pronation      ?Wrist supination      ?Grip strength      ? (Blank rows = not tested) ?  ?CERVICAL SPECIAL TESTS:  ?Distraction test: Positive ?  ?  ?PATIENT SURVEYS:  ?FOTO 38%  ?  ?TODAY'S TREATMENT:  ? ?09/25/2021: ?Nustep L1 x6 min with PT present to discuss status ?Sitting:  cervical extension, cervical rotation, scapular retraction x10 ?Supine with one pillow:  cervical retraction, shoulder press, shoulder flexion with 1#, chest press with 1#. 2x10 each ?Supine pec stretch: hands together at 90 degrees of flexion, then let arms fall to side for pec stretch x 10 hold 5 sec each ?Manual therapy with soft tissue mobilization to cervical paraspinals and upper traps with suboccipital release for tissue elongation ?Seated shoulder rows with yellow tband 2x10 ? ?09/23/2021: ?Supine shoulder AA/ROM with cane 2x10 ?Supine cervical retraction into pillow 2x10 ?Supine shoulder  press 2x10 ?Manual therapy with soft tissue mobilization with suboccipital release and manual trigger point release to trigger point noted L cervical paraspinal, gentle cervical manual traction with 10 sec h

## 2021-09-29 DIAGNOSIS — H00014 Hordeolum externum left upper eyelid: Secondary | ICD-10-CM | POA: Diagnosis not present

## 2021-09-30 ENCOUNTER — Other Ambulatory Visit: Payer: Self-pay

## 2021-09-30 ENCOUNTER — Ambulatory Visit: Payer: Medicare Other | Admitting: Student

## 2021-09-30 ENCOUNTER — Ambulatory Visit: Payer: Medicare Other | Admitting: Physical Therapy

## 2021-09-30 DIAGNOSIS — M542 Cervicalgia: Secondary | ICD-10-CM | POA: Diagnosis not present

## 2021-09-30 DIAGNOSIS — R293 Abnormal posture: Secondary | ICD-10-CM | POA: Diagnosis not present

## 2021-09-30 DIAGNOSIS — M6281 Muscle weakness (generalized): Secondary | ICD-10-CM

## 2021-09-30 NOTE — Therapy (Signed)
?OUTPATIENT PHYSICAL THERAPY TREATMENT NOTE ? ? ?Patient Name: Julie BARRELL ?MRN: [id number] ?DOB:1930-03-21, 88 y.o., female ?Today's Date: 09/30/2021 ? ?PCP: Eulas Post, MD ?REFERRING PROVIDER: Susa Day, MD ? ? PT End of Session - 09/30/21 1227   ? ? Visit Number 5   ? Date for PT Re-Evaluation 11/06/21   ? Authorization Type UHC Medicare   ? PT Start Time 1230   ? PT Stop Time 1310   ? PT Time Calculation (min) 40 min   ? Activity Tolerance Patient tolerated treatment well   ? ?  ?  ? ?  ? ? ?Past Medical History:  ?Diagnosis Date  ? Abdominal adhesions   ? Abdominal gas pain   ? suspected -- may be related to intermittent right upper quadrant discomfort radiating around her back      ? Anemia   ? Anginal pain ([place])   ? Arthritis   ? Atrial fibrillation ([place])   ? Breast cancer ([place])   ? Breast mass   ? left breast  ? Cancer ([place])   ? left breast  ? Chest pain   ? intermittent right upper quadrant discomfort radiating around her back      ? Complication of anesthesia   ? Diabetes mellitus without complication ([place])   ? Type II  ? Dyspepsia   ? Dyspnea   ? GERD (gastroesophageal reflux disease)   ? H/O: hysterectomy 1978  ? History of cardioversion   ? Hypercholesteremia   ? Hypothyroidism   ? Mitral insufficiency   ? Personal history of radiation therapy   ? Pneumonia   ? PONV (postoperative nausea and vomiting)   ? Right bundle branch block (RBBB)   ? Tendinitis   ? of the right arm and shoulder  ? Torn tendon   ? right shoulder  ? ?Past Surgical History:  ?Procedure Laterality Date  ? ABDOMINAL HYSTERECTOMY    ? APPENDECTOMY  1978  ? BLADDER REPAIR    ? BREAST LUMPECTOMY Left 02/24/2011  ? central partial mastectomy - dr Margot Chimes  ? BREAST REDUCTION SURGERY    ? CARDIOVERSION N/A 10/28/2017  ? Procedure: CARDIOVERSION;  Surgeon: Lelon Perla, MD;  Location: [facility] ENDOSCOPY;  Service: Cardiovascular;  Laterality: N/A;  ? CARDIOVERSION N/A 04/06/2019  ? Procedure: CARDIOVERSION;  Surgeon:  [name], MD;  Location: [place];  Service: Cardiovascular;  Laterality: N/A;  ? CARDIOVERSION N/A 09/08/2019  ? Procedure: CARDIOVERSION;  Surgeon: Dorothy Spark, MD;  Location: [place];  Service: Cardiovascular;  Laterality: N/A;  ? CARDIOVERSION N/A 08/28/2021  ? Procedure: CARDIOVERSION;  Surgeon: [name], MD;  Location: [facility] ENDOSCOPY;  Service: Cardiovascular;  Laterality: N/A;  ? COLONOSCOPY WITH PROPOFOL N/A 03/24/2017  ? Procedure: COLONOSCOPY WITH PROPOFOL;  Surgeon: Wilford Corner, MD;  Location: [place];  Service: Endoscopy;  Laterality: N/A;  ? ESOPHAGOGASTRODUODENOSCOPY (EGD) WITH PROPOFOL N/A 03/24/2017  ? Procedure: ESOPHAGOGASTRODUODENOSCOPY (EGD) WITH PROPOFOL;  Surgeon: Wilford Corner, MD;  Location: [place];  Service: Endoscopy;  Laterality: N/A;  ? LIPOMA EXCISION Right   ? REDUCTION MAMMAPLASTY Bilateral 1998  ? RETINAL DETACHMENT SURGERY Left   ? SALPINGOOPHORECTOMY    ? TEE WITHOUT CARDIOVERSION N/A 10/28/2017  ? Procedure: TRANSESOPHAGEAL ECHOCARDIOGRAM (TEE);  Surgeon: Lelon Perla, MD;  Location: [facility] ENDOSCOPY;  Service: Cardiovascular;  Laterality: N/A;  ? [place]  ? at 88 years of age  ? ?Patient Active Problem List  ? Diagnosis Date Noted  ? Secondary  hypercoagulable state ([place]) 09/05/2021  ? Persistent atrial fibrillation ([place]) 08/26/2021  ? Chronic insomnia 01/21/2021  ? Degeneration of lumbar intervertebral disc 02/03/2019  ? GERD (gastroesophageal reflux disease) 11/30/2017  ? Other persistent atrial fibrillation ([place])   ? Injury of muscle of rotator cuff 10/12/2017  ? Iron deficiency anemia, unspecified 03/24/2017  ? Poorly controlled type 2 diabetes mellitus ([place]) 02/23/2017  ? Mitral insufficiency 02/04/2016  ? Right bundle branch block 05/16/2015  ? Chest pain of unknown etiology 05/16/2015  ? Peripheral neuropathy 08/23/2014  ? Heart murmur previously undiagnosed 01/22/2014  ? Abdominal pain  11/11/2011  ? Ductal carcinoma in situ (DCIS) of left breast 01/12/2011  ? Hypercholesterolemia 11/28/2010  ? Hypothyroidism 11/28/2010  ? Postmenopausal state 11/28/2010  ? Osteoarthritis 11/28/2010  ? Dyspepsia 11/28/2010  ? ? ?REFERRING DIAG: Cervicalgia ? ?THERAPY DIAG:  ?Cervicalgia ? ?Abnormal posture ? ?Muscle weakness (generalized) ? ?PERTINENT HISTORY: AFib, osteopenia; neuropathy; diabetes, OA neck, scoliosis; husband passed away in 02/19/23 married 35 years; dtr [name] brought her today since she is unable to drive ? ?PRECAUTIONS: Fall ? ?SUBJECTIVE: I feel weak today.  I'm on an antibiotic for a stye.  It's harder to hold my head up today.   ?PAIN:  ?Are you having pain? no ? ? ?PATIENT GOALS: wants to continue working; spend time with family friends ? ? ?OBJECTIVE:  ?  ?DIAGNOSTIC FINDINGS:  ?Arthritis in neck;no fracture per Dr. Tonita Cong ?  ?PATIENT SURVEYS:  ?FOTO 38% on 09/11/2021 ?  ?  ?COGNITION: ?Overall cognitive status: Within functional limits for tasks assessed ?           ?  ?POSTURE:  ?09/11/2021: At rest head flexed 25 degrees flexion in sitting;  standing against wall at rest occiput 21cm from wall, with effort able to bring occiput 15cm from wall but unable to maintain 5 sec before head drops forward; inc thoracic kyphosis; right scoliosis  ?  ?PALPATION: ?Tender points in bil suboccipitals, upper traps   ?  ?CERVICAL AROM/PROM ?  ?A/PROM A/PROM (deg) ?09/11/2021 A/ROM (deg)  ?09/18/2021  ?Flexion     ?Extension 33 ?  38  ?Right lateral flexion Unable to keep head in neutral for sidebend   ?Left lateral flexion     ?Right rotation 40 ?  45  ?Left rotation 35 40  ? (Blank rows = not tested) ?  ?UE AROM/PROM: ?  ?A/PROM Right ?09/11/2021 Left ?09/11/2021  ?Shoulder flexion 120 in sitting; 150 in supine 120 in sitting; 150 in supine  ?Shoulder extension      ?Shoulder abduction      ?Shoulder adduction      ?Shoulder extension      ?Shoulder internal rotation WFLs WFLs  ?Shoulder external rotation WFLs WFLs   ?Elbow flexion      ?Elbow extension      ?Wrist flexion      ?Wrist extension      ?Wrist ulnar deviation      ?Wrist radial deviation      ?Wrist pronation      ?Wrist supination      ? (Blank rows = not tested) ?  ?UE MMT:  Cervical extensor strength for short term manual resistance is fair to good but she is unable to maintain pressure for durations > 15-20 sec  ?  ?MMT Right ?09/11/2021 Left ?09/11/2021  ?Shoulder flexion 4 4  ?Shoulder extension 4 4  ?Shoulder abduction 4 4  ?Shoulder adduction      ?Shoulder extension      ?  Shoulder internal rotation      ?Shoulder external rotation      ?Middle trapezius      ?Lower trapezius      ?Elbow flexion      ?Elbow extension      ?Wrist flexion      ?Wrist extension      ?Wrist ulnar deviation      ?Wrist radial deviation      ?Wrist pronation      ?Wrist supination      ?Grip strength      ? (Blank rows = not tested) ?  ?CERVICAL SPECIAL TESTS:  ?Distraction test: Positive ?  ?  ?PATIENT SURVEYS:  ?FOTO 38%  ?  ?TODAY'S TREATMENT:  ?09/30/2021: ?Nustep L1 x 5 min with PT present to discuss status ?Sitting:  cervical extension, cervical rotation, scapular retraction x10 ?Supine with one pillow:  cervical retraction, shoulder press, elbow press cane with 2# weight: shoulder flexion, chest press; horizontal abduction, windshield wipers 5x each ?Supine yellow band one pillow: bil shoulder extension 2x10 ?Supine pec stretch: hands together at 90 degrees of flexion, then let arms fall to side for pec stretch x 10 hold 5 sec each ?Manual therapy with soft tissue mobilization to cervical paraspinals and upper traps with suboccipital release for tissue elongation; therapist manually resisting cervical isometrics 5 sec holds ?Seated shoulder rows with yellow tband 2x10; seated thoracic extension with ball behind back 2x5 ?09/25/2021: ?Nustep L1 x6 min with PT present to discuss status ?Sitting:  cervical extension, cervical rotation, scapular retraction x10 ?Supine with one  pillow:  cervical retraction, shoulder press, shoulder flexion with 1#, chest press with 1#. 2x10 each ?Supine pec stretch: hands together at 90 degrees of flexion, then let arms fall to side for pec stretch x 10 hold 5

## 2021-10-02 ENCOUNTER — Ambulatory Visit: Payer: Medicare Other | Admitting: Physical Therapy

## 2021-10-02 DIAGNOSIS — M542 Cervicalgia: Secondary | ICD-10-CM

## 2021-10-02 DIAGNOSIS — M6281 Muscle weakness (generalized): Secondary | ICD-10-CM | POA: Diagnosis not present

## 2021-10-02 DIAGNOSIS — R293 Abnormal posture: Secondary | ICD-10-CM | POA: Diagnosis not present

## 2021-10-02 NOTE — Therapy (Signed)
?OUTPATIENT PHYSICAL THERAPY TREATMENT NOTE ? ? ?Patient Name: Julie Norton ?MRN: [id number] ?DOB:February 09, 1930, 88 y.o., female ?Today's Date: 10/02/2021 ? ?PCP: Eulas Post, MD ?REFERRING PROVIDER: Susa Day, MD ? ? PT End of Session - 10/02/21 1453   ? ? Visit Number 6   ? Date for PT Re-Evaluation 11/06/21   ? Authorization Type UHC Medicare   ? PT Start Time 5449   late  ? PT Stop Time 1528   ? PT Time Calculation (min) 35 min   ? Activity Tolerance Patient tolerated treatment well   ? ?  ?  ? ?  ? ? ?Past Medical History:  ?Diagnosis Date  ? Abdominal adhesions   ? Abdominal gas pain   ? suspected -- may be related to intermittent right upper quadrant discomfort radiating around her back      ? Anemia   ? Anginal pain ([place])   ? Arthritis   ? Atrial fibrillation ([place])   ? Breast cancer ([place])   ? Breast mass   ? left breast  ? Cancer ([place])   ? left breast  ? Chest pain   ? intermittent right upper quadrant discomfort radiating around her back      ? Complication of anesthesia   ? Diabetes mellitus without complication ([place])   ? Type II  ? Dyspepsia   ? Dyspnea   ? GERD (gastroesophageal reflux disease)   ? H/O: hysterectomy 1978  ? History of cardioversion   ? Hypercholesteremia   ? Hypothyroidism   ? Mitral insufficiency   ? Personal history of radiation therapy   ? Pneumonia   ? PONV (postoperative nausea and vomiting)   ? Right bundle branch block (RBBB)   ? Tendinitis   ? of the right arm and shoulder  ? Torn tendon   ? right shoulder  ? ?Past Surgical History:  ?Procedure Laterality Date  ? ABDOMINAL HYSTERECTOMY    ? APPENDECTOMY  1978  ? BLADDER REPAIR    ? BREAST LUMPECTOMY Left 02/24/2011  ? central partial mastectomy - dr Margot Chimes  ? BREAST REDUCTION SURGERY    ? CARDIOVERSION N/A 10/28/2017  ? Procedure: CARDIOVERSION;  Surgeon: Lelon Perla, MD;  Location: [facility] ENDOSCOPY;  Service: Cardiovascular;  Laterality: N/A;  ? CARDIOVERSION N/A 04/06/2019  ? Procedure: CARDIOVERSION;  Surgeon:  [name], MD;  Location: [place];  Service: Cardiovascular;  Laterality: N/A;  ? CARDIOVERSION N/A 09/08/2019  ? Procedure: CARDIOVERSION;  Surgeon: Dorothy Spark, MD;  Location: [place];  Service: Cardiovascular;  Laterality: N/A;  ? CARDIOVERSION N/A 08/28/2021  ? Procedure: CARDIOVERSION;  Surgeon: [name], MD;  Location: [facility] ENDOSCOPY;  Service: Cardiovascular;  Laterality: N/A;  ? COLONOSCOPY WITH PROPOFOL N/A 03/24/2017  ? Procedure: COLONOSCOPY WITH PROPOFOL;  Surgeon: Wilford Corner, MD;  Location: [place];  Service: Endoscopy;  Laterality: N/A;  ? ESOPHAGOGASTRODUODENOSCOPY (EGD) WITH PROPOFOL N/A 03/24/2017  ? Procedure: ESOPHAGOGASTRODUODENOSCOPY (EGD) WITH PROPOFOL;  Surgeon: Wilford Corner, MD;  Location: [place];  Service: Endoscopy;  Laterality: N/A;  ? LIPOMA EXCISION Right   ? REDUCTION MAMMAPLASTY Bilateral 1998  ? RETINAL DETACHMENT SURGERY Left   ? SALPINGOOPHORECTOMY    ? TEE WITHOUT CARDIOVERSION N/A 10/28/2017  ? Procedure: TRANSESOPHAGEAL ECHOCARDIOGRAM (TEE);  Surgeon: Lelon Perla, MD;  Location: [facility] ENDOSCOPY;  Service: Cardiovascular;  Laterality: N/A;  ? [place]  ? at 88 years of age  ? ?Patient Active Problem List  ? Diagnosis Date Noted  ?  Secondary hypercoagulable state ([place]) 09/05/2021  ? Persistent atrial fibrillation ([place]) 08/26/2021  ? Chronic insomnia 01/21/2021  ? Degeneration of lumbar intervertebral disc 02/03/2019  ? GERD (gastroesophageal reflux disease) 11/30/2017  ? Other persistent atrial fibrillation ([place])   ? Injury of muscle of rotator cuff 10/12/2017  ? Iron deficiency anemia, unspecified 03/24/2017  ? Poorly controlled type 2 diabetes mellitus ([place]) 02/23/2017  ? Mitral insufficiency 02/04/2016  ? Right bundle branch block 05/16/2015  ? Chest pain of unknown etiology 05/16/2015  ? Peripheral neuropathy 08/23/2014  ? Heart murmur previously undiagnosed 01/22/2014  ? Abdominal pain  11/11/2011  ? Ductal carcinoma in situ (DCIS) of left breast 01/12/2011  ? Hypercholesterolemia 11/28/2010  ? Hypothyroidism 11/28/2010  ? Postmenopausal state 11/28/2010  ? Osteoarthritis 11/28/2010  ? Dyspepsia 11/28/2010  ? ? ?REFERRING DIAG: Cervicalgia ? ?THERAPY DIAG:  ?Cervicalgia ? ?Abnormal posture ? ?Muscle weakness (generalized) ? ?PERTINENT HISTORY: AFib, osteopenia; neuropathy; diabetes, OA neck, scoliosis; husband passed away in 02/26/2023 married 91 years; dtr [name] brought her today since she is unable to drive ? ?PRECAUTIONS: Fall ? ?SUBJECTIVE: Presents with RW.  Had a massage yesterday which helped.  The antibiotic makes me feel weak.   ?PAIN:  ?Are you having pain? no ? ? ?PATIENT GOALS: wants to continue working; spend time with family friends ? ? ?OBJECTIVE:  ?  ?DIAGNOSTIC FINDINGS:  ?Arthritis in neck;no fracture per Dr. Tonita Cong ?  ?PATIENT SURVEYS:  ?FOTO 38% on 09/11/2021 ?  ?  ?COGNITION: ?Overall cognitive status: Within functional limits for tasks assessed ?           ?  ?POSTURE:  ?3/30: 4 cm occiput to wall ?09/11/2021: At rest head flexed 25 degrees flexion in sitting;  standing against wall at rest occiput 21cm from wall, with effort able to bring occiput 15cm from wall but unable to maintain 5 sec before head drops forward; inc thoracic kyphosis; right scoliosis  ?  ?PALPATION: ?Tender points in bil suboccipitals, upper traps   ?  ?CERVICAL AROM/PROM ?  ?A/PROM A/PROM (deg) ?09/11/2021 A/ROM (deg)  ?09/18/2021  ?Flexion     ?Extension 33 ?  38  ?Right lateral flexion Unable to keep head in neutral for sidebend   ?Left lateral flexion     ?Right rotation 40 ?  45  ?Left rotation 35 40  ? (Blank rows = not tested) ?  ?UE AROM/PROM: ?  ?A/PROM Right ?09/11/2021 Left ?09/11/2021  ?Shoulder flexion 120 in sitting; 150 in supine 120 in sitting; 150 in supine  ?Shoulder extension      ?Shoulder abduction      ?Shoulder adduction      ?Shoulder extension      ?Shoulder internal rotation WFLs WFLs  ?Shoulder  external rotation WFLs WFLs  ?Elbow flexion      ?Elbow extension      ?Wrist flexion      ?Wrist extension      ?Wrist ulnar deviation      ?Wrist radial deviation      ?Wrist pronation      ?Wrist supination      ? (Blank rows = not tested) ?  ?UE MMT:  Cervical extensor strength for short term manual resistance is fair to good but she is unable to maintain pressure for durations > 15-20 sec  ?  ?MMT Right ?09/11/2021 Left ?09/11/2021  ?Shoulder flexion 4 4  ?Shoulder extension 4 4  ?Shoulder abduction 4 4  ?Shoulder adduction      ?  Shoulder extension      ?Shoulder internal rotation      ?Shoulder external rotation      ?Middle trapezius      ?Lower trapezius      ?Elbow flexion      ?Elbow extension      ?Wrist flexion      ?Wrist extension      ?Wrist ulnar deviation      ?Wrist radial deviation      ?Wrist pronation      ?Wrist supination      ?Grip strength      ? (Blank rows = not tested) ?  ?CERVICAL SPECIAL TESTS:  ?Distraction test: Positive ?  ?  ?PATIENT SURVEYS:  ?FOTO 38%  ?  ?TODAY'S TREATMENT:  ? 10/02/2021: ?Nustep L1 x 3 min with PT present to discuss status ?Sitting:  cervical extension, cervical rotation, scapular retraction x10 ?Supine with one pillow:  cervical retraction, shoulder press, elbow press cane with 2# weight: shoulder flexion, chest press; horizontal abduction 10x each ?Supine yellow band one pillow: bil shoulder extension 2x10; diagonal extension 10x each side  ?Supine pec stretch: hands together at 90 degrees of flexion, then let arms fall to side for pec stretch x 10 hold 5 sec each ?Back to wall trying to get head  ?Manual therapy with soft tissue mobilization to cervical paraspinals and upper traps with suboccipital release for tissue elongation; therapist manually resisting cervical isometrics 5 sec holds ?Seated shoulder rows with yellow tband 2x10 ?09/30/2021: ?Nustep L1 x 5 min with PT present to discuss status ?Sitting:  cervical extension, cervical rotation, scapular  retraction x10 ?Supine with one pillow:  cervical retraction, shoulder press, elbow press cane with 2# weight: shoulder flexion, chest press; horizontal abduction, windshield wipers 5x each ?Supine yellow band one pi

## 2021-10-06 ENCOUNTER — Telehealth: Payer: Self-pay | Admitting: Pharmacist

## 2021-10-06 NOTE — Chronic Care Management (AMB) (Signed)
? ? ?Chronic Care Management ?Pharmacy Assistant  ? ?Name: Julie Norton  MRN: [id number] DOB: [birth year] ? ?Reason for Encounter: Medication Review Medication Coordination  ? ?Recent office visits:  ?09/17/21 [name], LPN - Patient presented for Medicare Annual Wellness Exam. No medication changes. ? ?Recent consult visits:  ?10/02/21 Alvera Singh, PT - Patient presented for Cervicalgia and other concerns. No medication changes. ? ?09/16/21 [place], Peter M, MD - Patient presented for persistent atrial fibrillation and other concerns. Decreased Potassium Chloride. Changed Torsemide ? ?09/09/21 Patient [facility] to [facility] for EKG ? ?09/05/21 Oliver Barre, PA (Cardiology) - Patient presented for Persistent atrial fibrillation and other concerns. No medication changes. ? ?Hospital visits:  ?Medication Reconciliation was completed by comparing discharge summary, patient?s EMR and Pharmacy list, and upon discussion with patient. ?  ?Patient presented to [facility] on 08/26/21 due to Persistent Atrial Fibrillation. Patient was present for 3 days. ?  ?New?Medications Started at [facility] Discharge:?? ?-started  ?dofetilide (TIKOSYN) ?  ?Medication Changes at Hospital Discharge: ?-Changed  ?none ?  ?Medications Discontinued at Hospital Discharge: ?-Stopped  ?zolpidem 5 MG tablet ?  ?Medications that remain the same after Hospital Discharge:??  ?-All other medications will remain the same.   ? ?Medications: ?Outpatient Encounter Medications as of 10/06/2021  ?Medication Sig Note  ? acetaminophen (TYLENOL) 500 MG tablet Take 1,000 mg by mouth every 6 (six) hours as needed for mild pain or moderate pain.    ? Calcium Carb-Cholecalciferol (CALCIUM+D3 PO) Take 1 tablet by mouth daily with breakfast.   ? Cholecalciferol (VITAMIN D3) 125 MCG (5000 UT) CAPS TAKE ONE CAPSULE BY MOUTH EVERY MORNING   ? dofetilide (TIKOSYN) 125 MCG capsule Take 1 capsule (125 mcg total) by mouth 2 (two)  times daily.   ? ELIQUIS 5 MG TABS tablet TAKE ONE TABLET BY MOUTH EVERY MORNING and TAKE ONE TABLET BY MOUTH EVERY EVENING   ? ferrous sulfate 325 (65 FE) MG EC tablet Take 325 mg daily   ? glucose blood (ONE TOUCH ULTRA TEST) test strip Check once daily. E11.40   ? insulin degludec (TRESIBA FLEXTOUCH) 100 UNIT/ML FlexTouch Pen Inject 24 units into THE SKIN ONCE daily AT 10pm (Patient taking differently: 14-18 Units in the morning.)   ? Insulin Pen Needle 29G X 5MM MISC Use once daily   ? levothyroxine (SYNTHROID) 50 MCG tablet TAKE ONE TABLET BY MOUTH BEFORE BREAKFAST   ? lidocaine (LIDODERM) 5 % Place 1 patch onto the skin See admin instructions. Apply 1 patch to the neck every day 08/26/2021: The patient DOES have a patch on at this time  ? loratadine (CLARITIN) 10 MG tablet Take 10 mg by mouth daily as needed for allergies.   ? MAGNESIUM PO Take 1 tablet by mouth daily with breakfast.   ? metFORMIN (GLUCOPHAGE-XR) 500 MG 24 hr tablet Take 1 tablet (500 mg total) by mouth daily.   ? metoprolol succinate (TOPROL-XL) 25 MG 24 hr tablet Take 0.5 tablets (12.5 mg total) by mouth every morning.   ? omeprazole (PRILOSEC) 20 MG capsule Take 1 capsule (20 mg total) by mouth as needed. (Patient taking differently: Take 20 mg by mouth 2 (two) times daily as needed (for reflux or heartburn).)   ? ONE TOUCH ULTRA TEST test strip CHECK ONCE A DAY   ? potassium chloride (KLOR-CON) 10 MEQ tablet Take 1 tablet (10 mEq total) by mouth 2 (two) times daily.   ? torsemide (DEMADEX)  20 MG tablet Take 1 tablet (20 mg total) by mouth daily as needed. Take daily prn weight gain or swelling   ? vitamin B-12 (CYANOCOBALAMIN) 1000 MCG tablet Take 1 tablet (1,000 mcg total) by mouth daily.   ? ?No facility-administered encounter medications on file as of 10/06/2021.  ?Reviewed chart for medication changes ahead of medication coordination call. ? ?No OVs, Consults, or hospital visits since last care coordination call/Pharmacist visit.  ? ?No  medication changes indicated  ?BP Readings from Last 3 Encounters:  ?09/16/21 100/60  ?09/09/21 (!) 94/58  ?09/05/21 (!) 98/58  ?  ?Lab Results  ?Component Value Date  ? HGBA1C 8.4 (H) 08/28/2021  ?  ? ?Patient obtains medications through Adherence Packaging  30 Days  ? ?Last adherence delivery included:  ?Eliquis '5mg'$  - take 1 tablet with breakfast and 1 tablet with dinner ?Metoprolol ER '25mg'$  - take half tablet daily with breakfast ?Levothyroxine 70mg - take 1 tablet before breakfast ?Metformin 500 mg -   take one tablet at dinner ?Potassium Chloride 10 meq- take 2 tablets at breakfast and one tablet at dinner (total 30 mEq) ? Torsemide '20mg'$  - take one tablet with breakfast and one tablet at dinner ?  ?Requested updated script for Metoprolol be sent to [facility] ?  ?dofetilide (TIKOSYN) 125 MCG capsule 08/29/2021 30  ?Patient reports she uses good rx for the above medication does not need transfer. ?Patient also aware of decrease in dose for her Metoprolol as she has been halving the pills since her appointment she reports she will return on tomorrow for a follow up. ?  ? ?Patient is due for next adherence delivery on: 10/16/21. ?Called patient and reviewed medications and coordinated delivery. ?Packs for 30 DS ? ?This delivery to include: ?Potassium Chloride 10 meq- take 2 tablets at breakfast and one tablet at dinner (total 30 mEq) ?Metoprolol ER '25mg'$  - take half tablet daily with breakfast ?Levothyroxine 548m - take 1 tablet before breakfast ?Eliquis '5mg'$  - take 1 tablet with breakfast and 1 tablet with dinner ? ? ? ?Patient will need a short fill of (med), prior to adherence delivery. (To align with sync date or if PRN med) ? ?Coordinated acute fill for (med) to be delivered (date). ? ?Patient declined the following medications (meds) due to (reason) ?TrTyler Aaslextouch 100u - Inject 24 units once daily (still has 2.5) ?Metformin 500 mg -   take one tablet at dinner (pt stopped due to diarrhea does not want to  continue has had that happen before) ?Torsemide '20mg'$  - take one tablet daily PRN (on hand supply) ? ? ?Confirmed delivery date of 10/07/21, advised patient that pharmacy will contact them the morning of delivery.  ? ?Care Gaps: ?Urine Micro - Overdue ?Foot Exam - Overdue ?COVID Booster - Overdue ?CCM- 6/23 ?AWV- 3/22 ?BP- 100/60 ( 09/16/21) ? ?Star Rating Drugs: ?Metformin 500 mg - Last filled 10/01/21 30 DS at [facility] ?  ?Patient Assistance: ?Eliquis -  Denied in 2022 - 06/11/21 offered free trial card for eliquis per MaJeni Sallesatient accepted ?  ?LaNed ClinesMA ?Clinical Pharmacist Assistant ?[phone]

## 2021-10-07 ENCOUNTER — Ambulatory Visit: Payer: Medicare Other | Attending: Specialist | Admitting: Physical Therapy

## 2021-10-07 DIAGNOSIS — M6281 Muscle weakness (generalized): Secondary | ICD-10-CM | POA: Insufficient documentation

## 2021-10-07 DIAGNOSIS — M542 Cervicalgia: Secondary | ICD-10-CM | POA: Diagnosis not present

## 2021-10-07 DIAGNOSIS — R293 Abnormal posture: Secondary | ICD-10-CM | POA: Diagnosis not present

## 2021-10-07 NOTE — Therapy (Signed)
?OUTPATIENT PHYSICAL THERAPY TREATMENT NOTE ? ? ?Patient Name: Julie Norton ?MRN: [id number] ?DOB:September 04, 1929, 88 y.o., female ?Today's Date: 10/07/2021 ? ?PCP: Eulas Post, MD ?REFERRING PROVIDER: Susa Day, MD ? ? PT End of Session - 10/07/21 1451   ? ? Visit Number 7   ? Date for PT Re-Evaluation 11/06/21   ? Authorization Type [employer] Medicare   ? PT Start Time 1450   ? PT Stop Time 1530   ? PT Time Calculation (min) 40 min   ? Activity Tolerance Patient tolerated treatment well   ? ?  ?  ? ?  ? ? ?Past Medical History:  ?Diagnosis Date  ? Abdominal adhesions   ? Abdominal gas pain   ? suspected -- may be related to intermittent right upper quadrant discomfort radiating around her back      ? Anemia   ? Anginal pain ([place])   ? Arthritis   ? Atrial fibrillation ([place])   ? Breast cancer ([place])   ? Breast mass   ? left breast  ? Cancer ([place])   ? left breast  ? Chest pain   ? intermittent right upper quadrant discomfort radiating around her back      ? Complication of anesthesia   ? Diabetes mellitus without complication ([place])   ? Type II  ? Dyspepsia   ? Dyspnea   ? GERD (gastroesophageal reflux disease)   ? H/O: hysterectomy 1978  ? History of cardioversion   ? Hypercholesteremia   ? Hypothyroidism   ? Mitral insufficiency   ? Personal history of radiation therapy   ? Pneumonia   ? PONV (postoperative nausea and vomiting)   ? Right bundle branch block (RBBB)   ? Tendinitis   ? of the right arm and shoulder  ? Torn tendon   ? right shoulder  ? ?Past Surgical History:  ?Procedure Laterality Date  ? ABDOMINAL HYSTERECTOMY    ? APPENDECTOMY  1978  ? BLADDER REPAIR    ? BREAST LUMPECTOMY Left 02/24/2011  ? central partial mastectomy - dr Margot Chimes  ? BREAST REDUCTION SURGERY    ? CARDIOVERSION N/A 10/28/2017  ? Procedure: CARDIOVERSION;  Surgeon: Lelon Perla, MD;  Location: [facility] ENDOSCOPY;  Service: Cardiovascular;  Laterality: N/A;  ? CARDIOVERSION N/A 04/06/2019  ? Procedure: CARDIOVERSION;  Surgeon: [name], MD;  Location: [place];  Service: Cardiovascular;  Laterality: N/A;  ? CARDIOVERSION N/A 09/08/2019  ? Procedure: CARDIOVERSION;  Surgeon: Dorothy Spark, MD;  Location: [place];  Service: Cardiovascular;  Laterality: N/A;  ? CARDIOVERSION N/A 08/28/2021  ? Procedure: CARDIOVERSION;  Surgeon: [name], MD;  Location: [facility] ENDOSCOPY;  Service: Cardiovascular;  Laterality: N/A;  ? COLONOSCOPY WITH PROPOFOL N/A 03/24/2017  ? Procedure: COLONOSCOPY WITH PROPOFOL;  Surgeon: Wilford Corner, MD;  Location: [place];  Service: Endoscopy;  Laterality: N/A;  ? ESOPHAGOGASTRODUODENOSCOPY (EGD) WITH PROPOFOL N/A 03/24/2017  ? Procedure: ESOPHAGOGASTRODUODENOSCOPY (EGD) WITH PROPOFOL;  Surgeon: Wilford Corner, MD;  Location: [place];  Service: Endoscopy;  Laterality: N/A;  ? LIPOMA EXCISION Right   ? REDUCTION MAMMAPLASTY Bilateral 1998  ? RETINAL DETACHMENT SURGERY Left   ? SALPINGOOPHORECTOMY    ? TEE WITHOUT CARDIOVERSION N/A 10/28/2017  ? Procedure: TRANSESOPHAGEAL ECHOCARDIOGRAM (TEE);  Surgeon: Lelon Perla, MD;  Location: [facility] ENDOSCOPY;  Service: Cardiovascular;  Laterality: N/A;  ? [place]  ? at 88 years of age  ? ?Patient Active Problem List  ? Diagnosis Date Noted  ? Secondary  hypercoagulable state ([place]) 09/05/2021  ? Persistent atrial fibrillation ([place]) 08/26/2021  ? Chronic insomnia 01/21/2021  ? Degeneration of lumbar intervertebral disc 02/03/2019  ? GERD (gastroesophageal reflux disease) 11/30/2017  ? Other persistent atrial fibrillation ([place])   ? Injury of muscle of rotator cuff 10/12/2017  ? Iron deficiency anemia, unspecified 03/24/2017  ? Poorly controlled type 2 diabetes mellitus ([place]) 02/23/2017  ? Mitral insufficiency 02/04/2016  ? Right bundle branch block 05/16/2015  ? Chest pain of unknown etiology 05/16/2015  ? Peripheral neuropathy 08/23/2014  ? Heart murmur previously undiagnosed 01/22/2014  ? Abdominal pain 11/11/2011  ?  Ductal carcinoma in situ (DCIS) of left breast 01/12/2011  ? Hypercholesterolemia 11/28/2010  ? Hypothyroidism 11/28/2010  ? Postmenopausal state 11/28/2010  ? Osteoarthritis 11/28/2010  ? Dyspepsia 11/28/2010  ? ? ?REFERRING DIAG: Cervicalgia ? ?THERAPY DIAG:  ?Cervicalgia ? ?Abnormal posture ? ?Muscle weakness (generalized) ? ?PERTINENT HISTORY: AFib, osteopenia; neuropathy; diabetes, OA neck, scoliosis; husband passed away in 02/25/2023 married 40 years; dtr [name] brought her today since she is unable to drive ? ?PRECAUTIONS: Fall ? ?SUBJECTIVE: Presents with RW.  Had a massage yesterday which helped.  The antibiotic makes me feel weak.   ?PAIN:  ?Are you having pain? no ? ? ?PATIENT GOALS: wants to continue working; spend time with family friends ? ? ?OBJECTIVE:  ?  ?DIAGNOSTIC FINDINGS:  ?Arthritis in neck;no fracture per Dr. Tonita Cong ?  ?PATIENT SURVEYS:  ?FOTO 38% on 09/11/2021 ?  ?  ?COGNITION: ?Overall cognitive status: Within functional limits for tasks assessed ?           ?  ?POSTURE:  ?3/30: 4 cm occiput to wall ?09/11/2021: At rest head flexed 25 degrees flexion in sitting;  standing against wall at rest occiput 21cm from wall, with effort able to bring occiput 15cm from wall but unable to maintain 5 sec before head drops forward; inc thoracic kyphosis; right scoliosis  ?  ?PALPATION: ?Tender points in bil suboccipitals, upper traps   ?  ?CERVICAL AROM/PROM   ?  ?A/PROM A/PROM (deg) ?09/11/2021 A/ROM (deg)  ?09/18/2021 10/07/21  ?Flexion      ?Extension 33 ?  38 40  ?Right lateral flexion Unable to keep head in neutral for sidebend  15  ?Left lateral flexion    12  ?Right rotation 40 ?  45 38  ?Left rotation 35 40 20 ?  ? (Blank rows = not tested) ?  ?UE AROM/PROM: ?  ?A/PROM Right ?09/11/2021 Left ?09/11/2021 Right/left ?4/4  ?Shoulder flexion 120 in sitting; 150 in supine 120 in sitting; 150 in supine 128 bil  ?Shoulder extension       ?Shoulder abduction       ?Shoulder adduction       ?Shoulder extension       ?Shoulder  internal rotation WFLs WFLs   ?Shoulder external rotation WFLs WFLs   ?Elbow flexion       ?Elbow extension       ?Wrist flexion       ?Wrist extension       ?Wrist ulnar deviation       ?Wrist radial deviation       ?Wrist pronation       ?Wrist supination       ? (Blank rows = not tested) ?  ?UE MMT:  Cervical extensor strength for short term manual resistance is fair to good but she is unable to maintain pressure for durations > 15-20 sec  ?  ?MMT  Right ?09/11/2021 Left ?09/11/2021  ?Shoulder flexion 4 4  ?Shoulder extension 4 4  ?Shoulder abduction 4 4  ?Shoulder adduction      ?Shoulder extension      ?Shoulder internal rotation      ?Shoulder external rotation      ?Middle trapezius      ?Lower trapezius      ?Elbow flexion      ?Elbow extension      ?Wrist flexion      ?Wrist extension      ?Wrist ulnar deviation      ?Wrist radial deviation      ?Wrist pronation      ?Wrist supination      ?Grip strength      ? (Blank rows = not tested) ?  ?CERVICAL SPECIAL TESTS:  ?Distraction test: Positive ?  ?  ?PATIENT SURVEYS:  ?FOTO 38%  ?  ?TODAY'S TREATMENT:  ?10/07/21 Hold on Nu-step (pt feels it bothers her legs at night) ?Supine with one folded pillow:  cervical retraction, shoulder press, elbow press cane with 2# weight: shoulder flexion, chest press; horizontal abduction 10x each ?Supine yellow band one folded pillow: bil shoulder extension x10; diagonal extension 10x each side ; yellow band external rotation 10x bil  ?Manual therapy with soft tissue mobilization to cervical paraspinals and upper traps with suboccipital release for tissue elongation; therapist manually resisting cervical isometrics rotation and retraction 5 sec holds ?Seated shoulder rows with yellow tband x10 ?Walker push outs x10; push down on walker 5x 5 sec holds ?Standing UE elevation 2x 5 on each side  ? 10/02/2021: ?Nustep L1 x 3 min with PT present to discuss status ?Sitting:  cervical extension, cervical rotation, scapular retraction  x10 ?Supine with one pillow:  cervical retraction, shoulder press, elbow press cane with 2# weight: shoulder flexion, chest press; horizontal abduction 10x each ?Supine yellow band one pillow: bil shoulder

## 2021-10-09 ENCOUNTER — Encounter: Payer: Medicare Other | Admitting: Physical Therapy

## 2021-10-10 ENCOUNTER — Ambulatory Visit: Payer: Medicare Other | Admitting: Physician Assistant

## 2021-10-20 ENCOUNTER — Encounter: Payer: Self-pay | Admitting: Internal Medicine

## 2021-10-20 ENCOUNTER — Ambulatory Visit (INDEPENDENT_AMBULATORY_CARE_PROVIDER_SITE_OTHER): Payer: Medicare Other | Admitting: Internal Medicine

## 2021-10-20 VITALS — BP 134/76 | HR 66 | Temp 98.6°F | Ht 61.0 in | Wt 121.6 lb

## 2021-10-20 DIAGNOSIS — M5382 Other specified dorsopathies, cervical region: Secondary | ICD-10-CM

## 2021-10-20 DIAGNOSIS — E1165 Type 2 diabetes mellitus with hyperglycemia: Secondary | ICD-10-CM

## 2021-10-20 DIAGNOSIS — D6869 Other thrombophilia: Secondary | ICD-10-CM

## 2021-10-20 DIAGNOSIS — R531 Weakness: Secondary | ICD-10-CM

## 2021-10-20 DIAGNOSIS — D649 Anemia, unspecified: Secondary | ICD-10-CM | POA: Diagnosis not present

## 2021-10-20 DIAGNOSIS — Z9181 History of falling: Secondary | ICD-10-CM

## 2021-10-20 DIAGNOSIS — M419 Scoliosis, unspecified: Secondary | ICD-10-CM | POA: Diagnosis not present

## 2021-10-20 NOTE — Patient Instructions (Signed)
Lab today  updated   ?Consider  further imaging    but possible coordination with Dr Lawanda Cousins office and  PCP.  ?I will discuss with dr Elease Hashimoto also about  plan next step.  ?

## 2021-10-20 NOTE — Progress Notes (Signed)
? ?Chief Complaint  ?Patient presents with  ? Fatigue  ?  Neck issues while walking  ? ? ?HPI: ?Julie Norton 88 y.o. Pt of Dr Elease Hashimoto  with above problems ? ?Went to ortho and had x ray  a month + ago and wonders if needs mri or more evaluation. Getting weaker each day .  Had a fall in the kitchen February 17 the day after trying some Ambien which she felt she really wanted to try because her sleep was disrupted.  had no loss of consciousness did not tell her family until the next day or 2. ?Since that time she reports neck pain and stiffness where the pain has improved but she is unable to raise or straighten her neck for more than 15 or 20 minutes in the morning. ?She was seen at Dr. Bernadette Hoit office her orthopedics the PA performed x-rays and ordered ?Therapy and massages   underway also soft collar and lidoderm patches  ?She is still bothered as she Cant hold up head after a   about 15 + minutes  a while  ?Feels weak all over like getting weaker.  She has had persistent what she feels is right hand weakness but nothing acute hard to open jars.  Denies any tremors but does state occasionally she gets shaky but not related to low blood sugars ?Feels that she is due for an A1c and her sugars are lower recently in the 115 range but no true hypoglycemia. ? ?Was in [place] in February . Incase tikosyn.  Evaluation ?Stopped metformin  ? If caused diarrhea . ?Has known anemia but no active bleeding per se. ?No change in cognition headaches hearing vision. ?ROS: See pertinent positives and negatives per HPI. ? ?Past Medical History:  ?Diagnosis Date  ? Abdominal adhesions   ? Abdominal gas pain   ? suspected -- may be related to intermittent right upper quadrant discomfort radiating around her back      ? Anemia   ? Anginal pain ([place])   ? Arthritis   ? Atrial fibrillation ([place])   ? Breast cancer ([place])   ? Breast mass   ? left breast  ? Cancer ([place])   ? left breast  ? Chest pain   ? intermittent right upper quadrant  discomfort radiating around her back      ? Complication of anesthesia   ? Diabetes mellitus without complication ([place])   ? Type II  ? Dyspepsia   ? Dyspnea   ? GERD (gastroesophageal reflux disease)   ? H/O: hysterectomy 1978  ? History of cardioversion   ? Hypercholesteremia   ? Hypothyroidism   ? Mitral insufficiency   ? Personal history of radiation therapy   ? Pneumonia   ? PONV (postoperative nausea and vomiting)   ? Right bundle branch block (RBBB)   ? Tendinitis   ? of the right arm and shoulder  ? Torn tendon   ? right shoulder  ? ? ?Family History  ?Problem Relation Age of Onset  ? Pneumonia Father   ? Heart attack Mother   ? Hypertension Mother   ? Stroke Mother   ? Diabetes Mother   ? Aortic aneurysm Mother   ?     abdominal aortic aneurysm  ? Cancer Mother   ?     bladder  ? Aortic aneurysm Sister   ?     abdominal aortic aneurysm  ? ? ?Social History  ? ?Socioeconomic History  ? Marital status:  Widowed  ?  Spouse name: Not on file  ? Number of children: Not on file  ? Years of education: Not on file  ? Highest education level: Not on file  ?Occupational History  ? Not on file  ?Tobacco Use  ? Smoking status: Never  ? Smokeless tobacco: Never  ?Vaping Use  ? Vaping Use: Never used  ?Substance and Sexual Activity  ? Alcohol use: No  ? Drug use: No  ? Sexual activity: Not on file  ?Other Topics Concern  ? Not on file  ?Social History Narrative  ? Not on file  ? ?Social Determinants of Health  ? ?Financial Resource Strain: Low Risk   ? Difficulty of Paying Living Expenses: Not hard at all  ?Food Insecurity: No Food Insecurity  ? Worried About Charity fundraiser in the Last Year: Never true  ? Ran Out of Food in the Last Year: Never true  ?Transportation Needs: No Transportation Needs  ? Lack of Transportation (Medical): No  ? Lack of Transportation (Non-Medical): No  ?Physical Activity: Inactive  ? Days of Exercise per Week: 0 days  ? Minutes of Exercise per Session: 0 min  ?Stress: No Stress Concern  Present  ? Feeling of Stress : Not at all  ?Social Connections: Moderately Integrated  ? Frequency of Communication with Friends and Family: More than three times a week  ? Frequency of Social Gatherings with Friends and Family: More than three times a week  ? Attends Religious Services: More than 4 times per year  ? Active Member of Clubs or Organizations: Yes  ? Attends Archivist Meetings: More than 4 times per year  ? Marital Status: Widowed  ? ? ?Outpatient Medications Prior to Visit  ?Medication Sig Dispense Refill  ? acetaminophen (TYLENOL) 500 MG tablet Take 1,000 mg by mouth every 6 (six) hours as needed for mild pain or moderate pain.     ? Calcium Carb-Cholecalciferol (CALCIUM+D3 PO) Take 1 tablet by mouth daily with breakfast.    ? Cholecalciferol (VITAMIN D3) 125 MCG (5000 UT) CAPS TAKE ONE CAPSULE BY MOUTH EVERY MORNING 90 capsule 0  ? dofetilide (TIKOSYN) 125 MCG capsule Take 1 capsule (125 mcg total) by mouth 2 (two) times daily. 180 capsule 1  ? ELIQUIS 5 MG TABS tablet TAKE ONE TABLET BY MOUTH EVERY MORNING and TAKE ONE TABLET BY MOUTH EVERY EVENING 180 tablet 1  ? ferrous sulfate 325 (65 FE) MG EC tablet Take 325 mg daily  3  ? glucose blood (ONE TOUCH ULTRA TEST) test strip Check once daily. E11.40 100 each 11  ? insulin degludec (TRESIBA FLEXTOUCH) 100 UNIT/ML FlexTouch Pen Inject 24 units into THE SKIN ONCE daily AT 10pm (Patient taking differently: 14-18 Units in the morning.) 15 mL 3  ? Insulin Pen Needle 29G X 5MM MISC Use once daily 100 each 3  ? levothyroxine (SYNTHROID) 50 MCG tablet TAKE ONE TABLET BY MOUTH BEFORE BREAKFAST 90 tablet 1  ? lidocaine (LIDODERM) 5 % Place 1 patch onto the skin See admin instructions. Apply 1 patch to the neck every day    ? loratadine (CLARITIN) 10 MG tablet Take 10 mg by mouth daily as needed for allergies.    ? MAGNESIUM PO Take 1 tablet by mouth daily with breakfast.    ? metFORMIN (GLUCOPHAGE-XR) 500 MG 24 hr tablet Take 1 tablet (500 mg  total) by mouth daily. 90 tablet 1  ? metoprolol succinate (TOPROL-XL) 25 MG 24 hr  tablet Take 0.5 tablets (12.5 mg total) by mouth every morning. 45 tablet 3  ? omeprazole (PRILOSEC) 20 MG capsule Take 1 capsule (20 mg total) by mouth as needed. (Patient taking differently: Take 20 mg by mouth 2 (two) times daily as needed (for reflux or heartburn).)    ? ONE TOUCH ULTRA TEST test strip CHECK ONCE A DAY 50 each 3  ? potassium chloride (KLOR-CON) 10 MEQ tablet Take 1 tablet (10 mEq total) by mouth 2 (two) times daily. 270 tablet 1  ? torsemide (DEMADEX) 20 MG tablet Take 1 tablet (20 mg total) by mouth daily as needed. Take daily prn weight gain or swelling 90 tablet 3  ? vitamin B-12 (CYANOCOBALAMIN) 1000 MCG tablet Take 1 tablet (1,000 mcg total) by mouth daily. 90 tablet 3  ? ?No facility-administered medications prior to visit.  ? ? ? ?EXAM: ? ?BP 134/76 (BP Location: Left Arm, Patient Position: Sitting, Cuff Size: Normal)   Pulse 66   Temp 98.6 ?F (37 ?C) (Oral)   Ht '5\' 1"'$  (1.549 m)   Wt 121 lb 9.6 oz (55.2 kg)   SpO2 98%   BMI 22.98 kg/m?  ? ?Body mass index is 22.98 kg/m?. ? ?GENERAL: vitals reviewed and listed above, alert, oriented, appears well hydrated and in no acute distress has significant kyphosis affecting neck extension perhaps.  Cognition appears normal as well as speech and symmetry. ?HEENT: atraumatic, conjunctiva  clear, no obvious abnormalities on inspection of external nose and ears NECK: no obvious masses on inspection palpation no midline spine tenderness ?Spine kyphosis without localized pain. ?LUNGS: clear to auscultation bilaterally, no wheezes, rales or rhonchi ?CV: HRRR, no clubbing cyanosis or  peripheral edema nl cap refill  ?MS: moves all extremities without noticeable focal  abnormality ?Able to arise with caution uses a roller walker able to turn but difficulty extending head standing up straight. ?Neurologic no obvious tremor negative cogwheeling.  Cognition appears  intact. ?PSYCH: pleasant and cooperative, no obvious depression or anxiety ?Lab Results  ?Component Value Date  ? WBC 4.1 06/13/2021  ? HGB 8.4 (L) 06/13/2021  ? HCT 28.8 (L) 06/13/2021  ? PLT 172 06/13/2021  ? GLUCOSE 359 (H)

## 2021-10-21 LAB — CBC WITH DIFFERENTIAL/PLATELET
Basophils Absolute: 0 10*3/uL (ref 0.0–0.1)
Basophils Relative: 0.4 % (ref 0.0–3.0)
Eosinophils Absolute: 0.1 10*3/uL (ref 0.0–0.7)
Eosinophils Relative: 1.3 % (ref 0.0–5.0)
HCT: 39.6 % (ref 36.0–46.0)
Hemoglobin: 13.8 g/dL (ref 12.0–15.0)
Lymphocytes Relative: 23.1 % (ref 12.0–46.0)
Lymphs Abs: 1.5 10*3/uL (ref 0.7–4.0)
MCHC: 34.8 g/dL (ref 30.0–36.0)
MCV: 88.7 fl (ref 78.0–100.0)
Monocytes Absolute: 0.7 10*3/uL (ref 0.1–1.0)
Monocytes Relative: 10.1 % (ref 3.0–12.0)
Neutro Abs: 4.2 10*3/uL (ref 1.4–7.7)
Neutrophils Relative %: 65.1 % (ref 43.0–77.0)
Platelets: 172 10*3/uL (ref 150.0–400.0)
RBC: 4.47 Mil/uL (ref 3.87–5.11)
RDW: 14.1 % (ref 11.5–15.5)
WBC: 6.5 10*3/uL (ref 4.0–10.5)

## 2021-10-21 LAB — IBC PANEL
Iron: 139 ug/dL (ref 42–145)
Saturation Ratios: 34.1 % (ref 20.0–50.0)
TIBC: 407.4 ug/dL (ref 250.0–450.0)
Transferrin: 291 mg/dL (ref 212.0–360.0)

## 2021-10-21 LAB — BASIC METABOLIC PANEL
BUN: 12 mg/dL (ref 6–23)
CO2: 28 mEq/L (ref 19–32)
Calcium: 10.2 mg/dL (ref 8.4–10.5)
Chloride: 104 mEq/L (ref 96–112)
Creatinine, Ser: 0.68 mg/dL (ref 0.40–1.20)
GFR: 77.15 mL/min (ref >60.00)
Glucose, Bld: 86 mg/dL (ref 70–99)
Potassium: 4.5 mEq/L (ref 3.5–5.1)
Sodium: 140 mEq/L (ref 135–145)

## 2021-10-21 LAB — HEMOGLOBIN A1C: Hgb A1c MFr Bld: 7.4 % — ABNORMAL HIGH (ref 4.6–6.5)

## 2021-10-21 LAB — VITAMIN B12: Vitamin B-12: 1196 pg/mL — ABNORMAL HIGH (ref 211–911)

## 2021-10-26 NOTE — Progress Notes (Signed)
No anemia  b12 high and iron normal . Thyroid and chemistry is normal  ?A1c is 7.4 acceptable  improved  ?Dr Elease Hashimoto advised  a follow up with  ortho office  Dr Maxie Better, to see provider for reevaluation of  your neck predicament  and ask if  further imaging would behelpful ? ?  and also  make a follow up with Dr B  .  After that

## 2021-10-28 ENCOUNTER — Ambulatory Visit: Payer: Medicare Other | Admitting: Physical Therapy

## 2021-10-28 DIAGNOSIS — R293 Abnormal posture: Secondary | ICD-10-CM

## 2021-10-28 DIAGNOSIS — M542 Cervicalgia: Secondary | ICD-10-CM | POA: Diagnosis not present

## 2021-10-28 DIAGNOSIS — M6281 Muscle weakness (generalized): Secondary | ICD-10-CM

## 2021-10-28 NOTE — Therapy (Signed)
?OUTPATIENT PHYSICAL THERAPY TREATMENT NOTE ? ? ?Patient Name: Julie Norton ?MRN: [id number] ?DOB:April 04, 1930, 88 y.o., female ?Today's Date: 10/28/2021 ? ?PCP: Eulas Post, MD ?REFERRING PROVIDER: Susa Day, MD ? ? PT End of Session - 10/28/21 1502   ? ? Visit Number 8   ? Date for PT Re-Evaluation 11/06/21   ? Authorization Type UHC Medicare   ? PT Start Time 9983   ? PT Stop Time 1525   ? PT Time Calculation (min) 40 min   ? Activity Tolerance Patient tolerated treatment well   ? ?  ?  ? ?  ? ? ?Past Medical History:  ?Diagnosis Date  ? Abdominal adhesions   ? Abdominal gas pain   ? suspected -- may be related to intermittent right upper quadrant discomfort radiating around her back      ? Anemia   ? Anginal pain ([place])   ? Arthritis   ? Atrial fibrillation ([place])   ? Breast cancer ([place])   ? Breast mass   ? left breast  ? Cancer ([place])   ? left breast  ? Chest pain   ? intermittent right upper quadrant discomfort radiating around her back      ? Complication of anesthesia   ? Diabetes mellitus without complication ([place])   ? Type II  ? Dyspepsia   ? Dyspnea   ? GERD (gastroesophageal reflux disease)   ? H/O: hysterectomy 1978  ? History of cardioversion   ? Hypercholesteremia   ? Hypothyroidism   ? Mitral insufficiency   ? Personal history of radiation therapy   ? Pneumonia   ? PONV (postoperative nausea and vomiting)   ? Right bundle branch block (RBBB)   ? Tendinitis   ? of the right arm and shoulder  ? Torn tendon   ? right shoulder  ? ?Past Surgical History:  ?Procedure Laterality Date  ? ABDOMINAL HYSTERECTOMY    ? APPENDECTOMY  1978  ? BLADDER REPAIR    ? BREAST LUMPECTOMY Left 02/24/2011  ? central partial mastectomy - dr Margot Chimes  ? BREAST REDUCTION SURGERY    ? CARDIOVERSION N/A 10/28/2017  ? Procedure: CARDIOVERSION;  Surgeon: Lelon Perla, MD;  Location: [facility] ENDOSCOPY;  Service: Cardiovascular;  Laterality: N/A;  ? CARDIOVERSION N/A 04/06/2019  ? Procedure: CARDIOVERSION;  Surgeon:  [name], MD;  Location: [place];  Service: Cardiovascular;  Laterality: N/A;  ? CARDIOVERSION N/A 09/08/2019  ? Procedure: CARDIOVERSION;  Surgeon: Dorothy Spark, MD;  Location: [place];  Service: Cardiovascular;  Laterality: N/A;  ? CARDIOVERSION N/A 08/28/2021  ? Procedure: CARDIOVERSION;  Surgeon: [name], MD;  Location: [facility] ENDOSCOPY;  Service: Cardiovascular;  Laterality: N/A;  ? COLONOSCOPY WITH PROPOFOL N/A 03/24/2017  ? Procedure: COLONOSCOPY WITH PROPOFOL;  Surgeon: Wilford Corner, MD;  Location: [place];  Service: Endoscopy;  Laterality: N/A;  ? ESOPHAGOGASTRODUODENOSCOPY (EGD) WITH PROPOFOL N/A 03/24/2017  ? Procedure: ESOPHAGOGASTRODUODENOSCOPY (EGD) WITH PROPOFOL;  Surgeon: Wilford Corner, MD;  Location: [place];  Service: Endoscopy;  Laterality: N/A;  ? LIPOMA EXCISION Right   ? REDUCTION MAMMAPLASTY Bilateral 1998  ? RETINAL DETACHMENT SURGERY Left   ? SALPINGOOPHORECTOMY    ? TEE WITHOUT CARDIOVERSION N/A 10/28/2017  ? Procedure: TRANSESOPHAGEAL ECHOCARDIOGRAM (TEE);  Surgeon: Lelon Perla, MD;  Location: [facility] ENDOSCOPY;  Service: Cardiovascular;  Laterality: N/A;  ? [place]  ? at 88 years of age  ? ?Patient Active Problem List  ? Diagnosis Date Noted  ? Secondary  hypercoagulable state ([place]) 09/05/2021  ? Persistent atrial fibrillation ([place]) 08/26/2021  ? Chronic insomnia 01/21/2021  ? Degeneration of lumbar intervertebral disc 02/03/2019  ? GERD (gastroesophageal reflux disease) 11/30/2017  ? Other persistent atrial fibrillation ([place])   ? Injury of muscle of rotator cuff 10/12/2017  ? Iron deficiency anemia, unspecified 03/24/2017  ? Poorly controlled type 2 diabetes mellitus ([place]) 02/23/2017  ? Mitral insufficiency 02/04/2016  ? Right bundle branch block 05/16/2015  ? Chest pain of unknown etiology 05/16/2015  ? Peripheral neuropathy 08/23/2014  ? Heart murmur previously undiagnosed 01/22/2014  ? Abdominal pain  11/11/2011  ? Ductal carcinoma in situ (DCIS) of left breast 01/12/2011  ? Hypercholesterolemia 11/28/2010  ? Hypothyroidism 11/28/2010  ? Postmenopausal state 11/28/2010  ? Osteoarthritis 11/28/2010  ? Dyspepsia 11/28/2010  ? ? ?REFERRING DIAG: Cervicalgia ? ?THERAPY DIAG:  ?Cervicalgia ? ?Abnormal posture ? ?Muscle weakness (generalized) ? ?PERTINENT HISTORY: AFib, osteopenia; neuropathy; diabetes, OA neck, scoliosis; husband passed away in 03-13-23 married 38 years; dtr [name] brought her today since she is unable to drive ? ?PRECAUTIONS: Fall ? ?SUBJECTIVE:  I feel like I'm getting weaker.  I saw the doctor to ask about an MRI and I'm going to see the cardiologist about my medication.  Had a massage yesterday which helped.    ?PAIN:  ?Are you having pain? no ? ? ?PATIENT GOALS: wants to continue working; spend time with family friends ? ? ?OBJECTIVE:  ?  ?DIAGNOSTIC FINDINGS:  ?Arthritis in neck;no fracture per Dr. Tonita Cong ?  ?PATIENT SURVEYS:  ?FOTO 38% on 09/11/2021 ?  ?  ?COGNITION: ?Overall cognitive status: Within functional limits for tasks assessed ?           ?  ?POSTURE:  ?3/30: 4 cm occiput to wall ?09/11/2021: At rest head flexed 25 degrees flexion in sitting;  standing against wall at rest occiput 21cm from wall, with effort able to bring occiput 15cm from wall but unable to maintain 5 sec before head drops forward; inc thoracic kyphosis; right scoliosis  ?  ?PALPATION: ?Tender points in bil suboccipitals, upper traps   ?  ?CERVICAL AROM/PROM   ?  ?A/PROM A/PROM (deg) ?09/11/2021 A/ROM (deg)  ?09/18/2021 10/07/21  ?Flexion      ?Extension 33 ?  38 40  ?Right lateral flexion Unable to keep head in neutral for sidebend  15  ?Left lateral flexion    12  ?Right rotation 40 ?  45 38  ?Left rotation 35 40 20 ?  ? (Blank rows = not tested) ?  ?UE AROM/PROM: ?  ?A/PROM Right ?09/11/2021 Left ?09/11/2021 Right/left ?4/4  ?Shoulder flexion 120 in sitting; 150 in supine 120 in sitting; 150 in supine 128 bil  ?Shoulder extension        ?Shoulder abduction       ?Shoulder adduction       ?Shoulder extension       ?Shoulder internal rotation WFLs WFLs   ?Shoulder external rotation WFLs WFLs   ?Elbow flexion       ?Elbow extension       ?Wrist flexion       ?Wrist extension       ?Wrist ulnar deviation       ?Wrist radial deviation       ?Wrist pronation       ?Wrist supination       ? (Blank rows = not tested) ?  ?UE MMT:  Cervical extensor strength for short term manual resistance is fair  to good but she is unable to maintain pressure for durations > 15-20 sec  ?  ?MMT Right ?09/11/2021 Left ?09/11/2021  ?Shoulder flexion 4 4  ?Shoulder extension 4 4  ?Shoulder abduction 4 4  ?Shoulder adduction      ?Shoulder extension      ?Shoulder internal rotation      ?Shoulder external rotation      ?Middle trapezius      ?Lower trapezius      ?Elbow flexion      ?Elbow extension      ?Wrist flexion      ?Wrist extension      ?Wrist ulnar deviation      ?Wrist radial deviation      ?Wrist pronation      ?Wrist supination      ?Grip strength      ? (Blank rows = not tested) ?  ?CERVICAL SPECIAL TESTS:  ?Distraction test: Positive ?  ?  ?PATIENT SURVEYS:  ?FOTO 38%  ?  ?TODAY'S TREATMENT:  ? ?4/25: ?Hold on Nu-step (pt feels it bothers her legs at night) ?Supine with one pillow:  yellow band rows 10x  ?Supine shoulder press cane  2# weight: cane  shoulder flexion 2#;  rotations cane 2# ?Supine yellow band one folded pillow: bil shoulder extension x10; bil horizontal abduction ; yellow band external rotation 10x bil  ?Supine head retractions against folded pillow and then single unfolded pillow 10x each; ?Manual therapy with soft tissue mobilization to cervical paraspinals and upper traps with suboccipital release for tissue elongation; ?  ? ?10/07/21 Hold on Nu-step (pt feels it bothers her legs at night) ?Supine with one folded pillow:  cervical retraction, shoulder press, elbow press cane with 2# weight: shoulder flexion, chest press; horizontal abduction 10x  each ?Supine yellow band one folded pillow: bil shoulder extension x10; diagonal extension 10x each side ; yellow band external rotation 10x bil  ?Manual therapy with soft tissue mobilization to cervical paraspin

## 2021-10-30 ENCOUNTER — Ambulatory Visit: Payer: Medicare Other | Admitting: Physical Therapy

## 2021-10-30 DIAGNOSIS — M6281 Muscle weakness (generalized): Secondary | ICD-10-CM | POA: Diagnosis not present

## 2021-10-30 DIAGNOSIS — R293 Abnormal posture: Secondary | ICD-10-CM | POA: Diagnosis not present

## 2021-10-30 DIAGNOSIS — M542 Cervicalgia: Secondary | ICD-10-CM | POA: Diagnosis not present

## 2021-10-30 NOTE — Therapy (Signed)
?OUTPATIENT PHYSICAL THERAPY TREATMENT NOTE ? ? ?Patient Name: Julie Norton ?MRN: [id number] ?DOB:28-Apr-1930, 88 y.o., female ?Today's Date: 10/30/2021 ? ?PCP: Julie Post, MD ?REFERRING PROVIDER: Susa Day, MD ? ? PT End of Session - 10/30/21 1449   ? ? Visit Number 9   ? Date for PT Re-Evaluation 11/06/21   ? Authorization Type UHC Medicare   ? PT Start Time 1450   ? PT Stop Time 1528   ? PT Time Calculation (min) 38 min   ? Activity Tolerance Patient tolerated treatment well   ? ?  ?  ? ?  ? ? ?Past Medical History:  ?Diagnosis Date  ? Abdominal adhesions   ? Abdominal gas pain   ? suspected -- may be related to intermittent right upper quadrant discomfort radiating around her back      ? Anemia   ? Anginal pain (Julie Norton)   ? Arthritis   ? Atrial fibrillation (Julie Norton)   ? Breast cancer (Julie Norton)   ? Breast mass   ? left breast  ? Cancer (Julie Norton)   ? left breast  ? Chest pain   ? intermittent right upper quadrant discomfort radiating around her back      ? Complication of anesthesia   ? Diabetes mellitus without complication (Julie Norton)   ? Type II  ? Dyspepsia   ? Dyspnea   ? GERD (gastroesophageal reflux disease)   ? H/O: hysterectomy 1978  ? History of cardioversion   ? Hypercholesteremia   ? Hypothyroidism   ? Mitral insufficiency   ? Personal history of radiation therapy   ? Pneumonia   ? PONV (postoperative nausea and vomiting)   ? Right bundle branch block (RBBB)   ? Tendinitis   ? of the right arm and shoulder  ? Torn tendon   ? right shoulder  ? ?Past Surgical History:  ?Procedure Laterality Date  ? ABDOMINAL HYSTERECTOMY    ? APPENDECTOMY  1978  ? BLADDER REPAIR    ? BREAST LUMPECTOMY Left 02/24/2011  ? central partial mastectomy - dr Julie Norton  ? BREAST REDUCTION SURGERY    ? CARDIOVERSION N/A 10/28/2017  ? Procedure: CARDIOVERSION;  Surgeon: Julie Perla, MD;  [facility]: [facility] ENDOS[facility];  Service: Cardiovascular;  Laterality: N/A;  ? CARDIOVERSION N/A 04/06/2019  ? Procedure: CARDIOVERSION;  Surgeon:  Julie Dresser, MD;  [facility]: [facility];  Service: Cardiovascular;  Laterality: N/A;  ? CARDIOVERSION N/A 09/08/2019  ? Procedure: CARDIOVERSION;  Surgeon: Julie Spark, MD;  [facility]: [facility];  Service: Cardiovascular;  Laterality: N/A;  ? CARDIOVERSION N/A 08/28/2021  ? Procedure: CARDIOVERSION;  Surgeon: Julie Dresser, MD;  [facility]: [facility];  Service: Cardiovascular;  Laterality: N/A;  ? COLONOSCOPY WITH PROPOFOL N/A 03/24/2017  ? Procedure: COLONOSCOPY WITH PROPOFOL;  Surgeon: Julie Corner, MD;  [facility]: [facility];  Service: Endoscopy;  Laterality: N/A;  ? ESOPHAGOGASTRODUODENOSCOPY (EGD) WITH PROPOFOL N/A 03/24/2017  ? Procedure: ESOPHAGOGASTRODUODENOSCOPY (EGD) WITH PROPOFOL;  Surgeon: Julie Corner, MD;  [facility]: [facility];  Service: Endoscopy;  Laterality: N/A;  ? LIPOMA EXCISION Right   ? REDUCTION MAMMAPLASTY Bilateral 1998  ? RETINAL DETACHMENT SURGERY Left   ? SALPINGOOPHORECTOMY    ? TEE WITHOUT CARDIOVERSION N/A 10/28/2017  ? Procedure: TRANSESOPHAGEAL ECHOCARDIOGRAM (TEE);  Surgeon: Julie Perla, MD;  [facility]: [facility];  Service: Cardiovascular;  Laterality: N/A;  ? [place]  ? at 88 years of age  ? ?Patient Active Problem List  ? Diagnosis Date Noted  ? Secondary  hypercoagulable state (Julie Norton) 09/05/2021  ? Persistent atrial fibrillation (Julie Norton) 08/26/2021  ? Chronic insomnia 01/21/2021  ? Degeneration of lumbar intervertebral disc 02/03/2019  ? GERD (gastroesophageal reflux disease) 11/30/2017  ? Other persistent atrial fibrillation (Julie Norton)   ? Injury of muscle of rotator cuff 10/12/2017  ? Iron deficiency anemia, unspecified 03/24/2017  ? Poorly controlled type 2 diabetes mellitus (Julie Norton) 02/23/2017  ? Mitral insufficiency 02/04/2016  ? Right bundle branch block 05/16/2015  ? Chest pain of unknown etiology 05/16/2015  ? Peripheral neuropathy 08/23/2014  ? Heart murmur previously undiagnosed 01/22/2014  ? Abdominal pain  11/11/2011  ? Ductal carcinoma in situ (DCIS) of left breast 01/12/2011  ? Hypercholesterolemia 11/28/2010  ? Hypothyroidism 11/28/2010  ? Postmenopausal state 11/28/2010  ? Osteoarthritis 11/28/2010  ? Dyspepsia 11/28/2010  ? ? ?REFERRING DIAG: Cervicalgia ? ?THERAPY DIAG:  ?Cervicalgia ? ?Abnormal posture ? ?Muscle weakness (generalized) ? ?PERTINENT HISTORY: AFib, osteopenia; neuropathy; diabetes, OA neck, scoliosis; husband passed away in 05-Mar-2023 married 42 years; dtr [facility] brought her today since she is unable to drive ? ?PRECAUTIONS: Fall ? ?SUBJECTIVE:  Pt's caregiver states the patient did well with holding her head up until the next morning after last treatment session.  Scheduled for MRI Sunday. ?PAIN:  ?Are you having pain? no ? ? ?PATIENT GOALS: wants to continue working; spend time with family friends ? ? ?OBJECTIVE:  ?  ?DIAGNOSTIC FINDINGS:  ?Arthritis in neck;no fracture per Julie Norton ?  ?PATIENT SURVEYS:  ?FOTO 38% on 09/11/2021 ?  ?  ?COGNITION: ?Overall cognitive status: Within functional limits for tasks assessed ?           ?  ?POSTURE:  ?3/30: 4 cm occiput to wall ?09/11/2021: At rest head flexed 25 degrees flexion in sitting;  standing against wall at rest occiput 21cm from wall, with effort able to bring occiput 15cm from wall but unable to maintain 5 sec before head drops forward; inc thoracic kyphosis; right scoliosis  ?  ?PALPATION: ?Tender points in bil suboccipitals, upper traps   ?  ?CERVICAL AROM/PROM   ?  ?A/PROM A/PROM (deg) ?09/11/2021 A/ROM (deg)  ?09/18/2021 10/07/21  ?Flexion      ?Extension 33 ?  38 40  ?Right lateral flexion Unable to keep head in neutral for sidebend  15  ?Left lateral flexion    12  ?Right rotation 40 ?  45 38  ?Left rotation 35 40 20 ?  ? (Blank rows = not tested) ?  ?UE AROM/PROM: ?  ?A/PROM Right ?09/11/2021 Left ?09/11/2021 Right/left ?4/4  ?Shoulder flexion 120 in sitting; 150 in supine 120 in sitting; 150 in supine 128 bil  ?Shoulder extension       ?Shoulder abduction        ?Shoulder adduction       ?Shoulder extension       ?Shoulder internal rotation WFLs WFLs   ?Shoulder external rotation WFLs WFLs   ?Elbow flexion       ?Elbow extension       ?Wrist flexion       ?Wrist extension       ?Wrist ulnar deviation       ?Wrist radial deviation       ?Wrist pronation       ?Wrist supination       ? (Blank rows = not tested) ?  ?UE MMT:  Cervical extensor strength for short term manual resistance is fair to good but she is unable to maintain pressure for durations >  15-20 sec  ?  ?MMT Right ?09/11/2021 Left ?09/11/2021  ?Shoulder flexion 4 4  ?Shoulder extension 4 4  ?Shoulder abduction 4 4  ?Shoulder adduction      ?Shoulder extension      ?Shoulder internal rotation      ?Shoulder external rotation      ?Middle trapezius      ?Lower trapezius      ?Elbow flexion      ?Elbow extension      ?Wrist flexion      ?Wrist extension      ?Wrist ulnar deviation      ?Wrist radial deviation      ?Wrist pronation      ?Wrist supination      ?Grip strength      ? (Blank rows = not tested) ?  ?CERVICAL SPECIAL TESTS:  ?Distraction test: Positive ?  ?  ?PATIENT SURVEYS:  ?FOTO 38%  ?  ?TODAY'S TREATMENT:  ?4/27: ?Supine with one flat pillow:  red band rows 10x  ?Supine folded pillow elbow press down 10x; palms up whole arm press down 10x; ?Supine one flat pillow shoulder press cane  2# weight: cane  shoulder flexion 2#;  rotations cane 2# ?Supine red band one flat pillow: bil shoulder extension  x10; ?Supine bil horizontal abduction  yellow band 10x; yellow band external rotation 10x bil; yellow band overhead elevation 10x ?Supine head retractions against folded pillow and then single unfolded pillow 10x each; ?Manual therapy with soft tissue mobilization to cervical paraspinals and upper traps with suboccipital release for tissue elongation ?  ?4/25: ?Hold on Nu-step (pt feels it bothers her legs at night) ?Supine with one pillow:  yellow band rows 10x  ?Supine shoulder press cane  2# weight: cane   bench press; shoulder flexion;  rotations cane 10x each ?Supine yellow band one folded pillow: bil shoulder extension x10; bil horizontal abduction ; yellow band external rotation 10x bil  ?Supine head retracti

## 2021-11-02 DIAGNOSIS — M542 Cervicalgia: Secondary | ICD-10-CM | POA: Diagnosis not present

## 2021-11-02 NOTE — Progress Notes (Signed)
? ?Cardiology Office Note ?Date:  11/05/2021  ?Patient ID:  Julie Norton, DOB Jan 11, 1930, MRN [id number] ?PCP:  Julie Post, MD  ?Cardiologist:  Dr. Martinique ?Electrophysiologist: new, Dr. Curt Bears ? ?  ?Chief Complaint:  s/p Tikosyn ? ?History of Present Illness: ?Julie Norton is a 88 y.o. female with history of HLD, DM, chronic CHF (diastolic), Afib, RBBB ? ?She comes in today to be seen for Dr. Curt Bears, last seen by him during her Tikosyn admission, also where she 1st saw him.  She was discharged 08/29/21 ? ?She saw the AFib clinic as usual maintaining SR but not feeling any better, c/w DOE, fatigued, having issues with arthritis in her neck as well. ?Discussed that Eliquis dose should be 2.5, though given s/p DCCV not reduced, recommended consideration to do this at her next visit. ?QTc felt borderline with plans for EKG the following week.  Her BP low and BB was reduced. ? ?09/09/21, EKG done as planned, reviewed with EP MD and felt given RBBB was ok.  BP still a bit low and torsemide reduced ? ?She saw Dr. Martinique 09/16/21, still reported feeling quite weak, even difficulty holding her head up, pending ortho eval, wearing a C collar.  Reported low BPs ?Weight was down and suspected she was dehydrated, torsemide changed to PRN and K+ reduced. ? ?TODAY ?She is accompanied by a close friend who also helps her at home, get to visits. ?She feels poorly ?Just prior to her Tikosyn admission she had a fall, she did have pain/neck pain, not associated with LOC (had taken Ambien the night before seems for the 1st time) followed by some neck stiffness and pain, she saw her orthopedic team with Xrays, given a C collar, pain management, and PT ordered. ?Since then she seems to have had steady functional decline and generalized weakness, particularly her neck.  Sometimes unable to hold her head up under her own power. ?She also feels like her voice is shaky/hoarse ?She has seen her PMD office for the same recently,  though saw another MD. ?She had an MRI done last week, pending the results from ortho. ?With this she has not felt well. ?She asks if these symptoms could be the Tikosyn given the timing. ? ?She really does not recall now what if any symptoms she had with her Afib, but feels so weak and generally poor now even in SR ? ?No dizzy spells, no near syncope  or syncope ?No palpitations or cardiac awareness ?No SOB ?No bleeding ? ?AFib/AAD hx ?Diagnosed March 2019 ?Flecainide tried 2020 intolerant with nausea/diarrhea ?Amiodarone started Dec 2020 > stopped Dec 2021 also 2/2 nausea ?Tikosyn started Feb 2023 ? ?Past Medical History:  ?Diagnosis Date  ? Abdominal adhesions   ? Abdominal gas pain   ? suspected -- may be related to intermittent right upper quadrant discomfort radiating around her back      ? Anemia   ? Anginal pain ([place])   ? Arthritis   ? Atrial fibrillation ([facility])   ? Breast cancer ([place])   ? Breast mass   ? left breast  ? Cancer ([place])   ? left breast  ? Chest pain   ? intermittent right upper quadrant discomfort radiating around her back      ? Complication of anesthesia   ? Diabetes mellitus without complication ([place])   ? Type II  ? Dyspepsia   ? Dyspnea   ? GERD (gastroesophageal reflux disease)   ? H/O: hysterectomy 1978  ?  History of cardioversion   ? Hypercholesteremia   ? Hypothyroidism   ? Mitral insufficiency   ? Personal history of radiation therapy   ? Pneumonia   ? PONV (postoperative nausea and vomiting)   ? Right bundle branch block (RBBB)   ? Tendinitis   ? of the right arm and shoulder  ? Torn tendon   ? right shoulder  ? ? ?Past Surgical History:  ?Procedure Laterality Date  ? ABDOMINAL HYSTERECTOMY    ? APPENDECTOMY  1978  ? BLADDER REPAIR    ? BREAST LUMPECTOMY Left 02/24/2011  ? central partial mastectomy - dr [facility]  ? BREAST REDUCTION SURGERY    ? CARDIOVERSION N/A 10/28/2017  ? Procedure: CARDIOVERSION;  Surgeon: Julie Perla, MD;  Location: [facility] ENDOSCOPY;  Service: Cardiovascular;   Laterality: N/A;  ? CARDIOVERSION N/A 04/06/2019  ? Procedure: CARDIOVERSION;  Surgeon: Julie Dresser, MD;  Location: [place];  Service: Cardiovascular;  Laterality: N/A;  ? CARDIOVERSION N/A 09/08/2019  ? Procedure: CARDIOVERSION;  Surgeon: Julie Spark, MD;  Location: [place];  Service: Cardiovascular;  Laterality: N/A;  ? CARDIOVERSION N/A 08/28/2021  ? Procedure: CARDIOVERSION;  Surgeon: Julie Dresser, MD;  Location: [facility];  Service: Cardiovascular;  Laterality: N/A;  ? COLONOSCOPY WITH PROPOFOL N/A 03/24/2017  ? Procedure: COLONOSCOPY WITH PROPOFOL;  Surgeon: Julie Corner, MD;  Location: [place];  Service: Endoscopy;  Laterality: N/A;  ? ESOPHAGOGASTRODUODENOSCOPY (EGD) WITH PROPOFOL N/A 03/24/2017  ? Procedure: ESOPHAGOGASTRODUODENOSCOPY (EGD) WITH PROPOFOL;  Surgeon: Julie Corner, MD;  Location: [place];  Service: Endoscopy;  Laterality: N/A;  ? LIPOMA EXCISION Right   ? REDUCTION MAMMAPLASTY Bilateral 1998  ? RETINAL DETACHMENT SURGERY Left   ? SALPINGOOPHORECTOMY    ? TEE WITHOUT CARDIOVERSION N/A 10/28/2017  ? Procedure: TRANSESOPHAGEAL ECHOCARDIOGRAM (TEE);  Surgeon: Julie Perla, MD;  [facility];  Service: Cardiovascular;  Laterality: N/A;  ? [place]  ? at 88 years of age  ? ? ?Current Outpatient Medications  ?Medication Sig Dispense Refill  ? acetaminophen (TYLENOL) 500 MG tablet Take 1,000 mg by mouth every 6 (six) hours as needed for mild pain or moderate pain.     ? Calcium Carb-Cholecalciferol (CALCIUM+D3 PO) Take 1 tablet by mouth daily with breakfast.    ? Cholecalciferol (VITAMIN D3) 125 MCG (5000 UT) CAPS TAKE ONE CAPSULE BY MOUTH EVERY MORNING 90 capsule 0  ? dofetilide (TIKOSYN) 125 MCG capsule Take 1 capsule (125 mcg total) by mouth 2 (two) times daily. 180 capsule 1  ? ELIQUIS 5 MG TABS tablet TAKE ONE TABLET BY MOUTH EVERY MORNING and TAKE ONE TABLET BY MOUTH EVERY EVENING 180 tablet 1  ?  ferrous sulfate 325 (65 FE) MG EC tablet Take 325 mg daily  3  ? glucose blood (ONE TOUCH ULTRA TEST) test strip Check once daily. E11.40 100 each 11  ? insulin degludec (TRESIBA FLEXTOUCH) 100 UNIT/ML FlexTouch Pen Inject 24 units into THE SKIN ONCE daily AT 10pm (Patient taking differently: 14-18 Units in the morning.) 15 mL 3  ? Insulin Pen Needle 29G X 5MM MISC Use once daily 100 each 3  ? levothyroxine (SYNTHROID) 50 MCG tablet TAKE ONE TABLET BY MOUTH BEFORE BREAKFAST 90 tablet 1  ? lidocaine (LIDODERM) 5 % Place 1 patch onto the skin See admin instructions. Apply 1 patch to the neck every day    ? loratadine (CLARITIN) 10 MG tablet Take 10 mg by mouth daily as needed for allergies.    ?  MAGNESIUM PO Take 1 tablet by mouth daily with breakfast.    ? metoprolol succinate (TOPROL-XL) 25 MG 24 hr tablet Take 0.5 tablets (12.5 mg total) by mouth every morning. 45 tablet 3  ? omeprazole (PRILOSEC) 20 MG capsule Take 1 capsule (20 mg total) by mouth as needed.    ? ONE TOUCH ULTRA TEST test strip CHECK ONCE A DAY 50 each 3  ? potassium chloride (KLOR-CON) 10 MEQ tablet Take 1 tablet (10 mEq total) by mouth 2 (two) times daily. 270 tablet 1  ? torsemide (DEMADEX) 20 MG tablet Take 1 tablet (20 mg total) by mouth daily as needed. Take daily prn weight gain or swelling 90 tablet 3  ? vitamin B-12 (CYANOCOBALAMIN) 1000 MCG tablet Take 1 tablet (1,000 mcg total) by mouth daily. 90 tablet 3  ? ?No current facility-administered medications for this visit.  ? ? ?Allergies:   Bactrim, Dilaudid [hydromorphone hcl], Hydrocodone, Invokana [canagliflozin], and Macrodantin  ? ?Social History:  The patient  reports that she has never smoked. She has never used smokeless tobacco. She reports that she does not drink alcohol and does not use drugs.  ? ?Family History:  The patient's family history includes Aortic aneurysm in her mother and sister; Cancer in her mother; Diabetes in her mother; Heart attack in her mother; Hypertension  in her mother; Pneumonia in her father; Stroke in her mother. ? ?ROS:  Please see the history of present illness.    ?All other systems are reviewed and otherwise negative.  ? ?PHYSICAL EXAM:  ?VS:  BP 135/8

## 2021-11-04 ENCOUNTER — Ambulatory Visit: Payer: Medicare Other | Attending: Specialist | Admitting: Physical Therapy

## 2021-11-04 ENCOUNTER — Telehealth: Payer: Self-pay | Admitting: Family Medicine

## 2021-11-04 DIAGNOSIS — M542 Cervicalgia: Secondary | ICD-10-CM | POA: Insufficient documentation

## 2021-11-04 DIAGNOSIS — M6281 Muscle weakness (generalized): Secondary | ICD-10-CM | POA: Diagnosis not present

## 2021-11-04 DIAGNOSIS — R293 Abnormal posture: Secondary | ICD-10-CM | POA: Diagnosis not present

## 2021-11-04 NOTE — Telephone Encounter (Signed)
Had MRI Sunday night, awaiting results, wants to know if dr wants her to come in now or wait unitil she receives results. Pls advise states you can leave a detailed message if you don't reach her ?

## 2021-11-04 NOTE — Therapy (Signed)
?OUTPATIENT PHYSICAL THERAPY TREATMENT NOTE ? ? ?Patient Name: Julie [facility] ?MRN: [id number] ?DOB:[birth year], 88 y.o., female ?Today's Date: 11/04/2021 ? ?PCP: Eulas Post, MD ?REFERRING PROVIDER: Susa Day, MD ?Progress Note ?Reporting Period 3/9 to 11/04/21 ? ?See note below for Objective Data and Assessment of Progress/Goals.  ? ?  ? ? PT End of Session - 11/04/21 1448   ? ? Visit Number 10   ? Date for PT Re-Evaluation 11/06/21   ? Authorization Type [employer] Medicare   ? PT Start Time 1448   ? PT Stop Time 1529   ? PT Time Calculation (min) 41 min   ? Activity Tolerance Patient tolerated treatment well   ? ?  ?  ? ?  ? ? ?Past Medical History:  ?Diagnosis Date  ? Abdominal adhesions   ? Abdominal gas pain   ? suspected -- may be related to intermittent right upper quadrant discomfort radiating around her back      ? Anemia   ? Anginal pain ([place])   ? Arthritis   ? Atrial fibrillation ([place])   ? Breast cancer ([place])   ? Breast mass   ? left breast  ? Cancer ([place])   ? left breast  ? Chest pain   ? intermittent right upper quadrant discomfort radiating around her back      ? Complication of anesthesia   ? Diabetes mellitus without complication ([place])   ? Type II  ? Dyspepsia   ? Dyspnea   ? GERD (gastroesophageal reflux disease)   ? H/O: hysterectomy 1978  ? History of cardioversion   ? Hypercholesteremia   ? Hypothyroidism   ? Mitral insufficiency   ? Personal history of radiation therapy   ? Pneumonia   ? PONV (postoperative nausea and vomiting)   ? Right bundle branch block (RBBB)   ? Tendinitis   ? of the right arm and shoulder  ? Torn tendon   ? right shoulder  ? ?Past Surgical History:  ?Procedure Laterality Date  ? ABDOMINAL HYSTERECTOMY    ? APPENDECTOMY  1978  ? BLADDER REPAIR    ? BREAST LUMPECTOMY Left 02/24/2011  ? central partial mastectomy - dr Margot Chimes  ? BREAST REDUCTION SURGERY    ? CARDIOVERSION N/A 10/28/2017  ? Procedure: CARDIOVERSION;  Surgeon: Lelon Perla, MD;  Location: [facility] ENDOSCOPY;   Service: Cardiovascular;  Laterality: N/A;  ? CARDIOVERSION N/A 04/06/2019  ? Procedure: CARDIOVERSION;  Surgeon: [name], MD;  Location: [place];  Service: Cardiovascular;  Laterality: N/A;  ? CARDIOVERSION N/A 09/08/2019  ? Procedure: CARDIOVERSION;  Surgeon: Dorothy Spark, MD;  Location: [place];  Service: Cardiovascular;  Laterality: N/A;  ? CARDIOVERSION N/A 08/28/2021  ? Procedure: CARDIOVERSION;  Surgeon: [name], MD;  Location: [facility] ENDOSCOPY;  Service: Cardiovascular;  Laterality: N/A;  ? COLONOSCOPY WITH PROPOFOL N/A 03/24/2017  ? Procedure: COLONOSCOPY WITH PROPOFOL;  Surgeon: Wilford Corner, MD;  Location: [place];  Service: Endoscopy;  Laterality: N/A;  ? ESOPHAGOGASTRODUODENOSCOPY (EGD) WITH PROPOFOL N/A 03/24/2017  ? Procedure: ESOPHAGOGASTRODUODENOSCOPY (EGD) WITH PROPOFOL;  Surgeon: Wilford Corner, MD;  Location: [place];  Service: Endoscopy;  Laterality: N/A;  ? LIPOMA EXCISION Right   ? REDUCTION MAMMAPLASTY Bilateral 1998  ? RETINAL DETACHMENT SURGERY Left   ? SALPINGOOPHORECTOMY    ? TEE WITHOUT CARDIOVERSION N/A 10/28/2017  ? Procedure: TRANSESOPHAGEAL ECHOCARDIOGRAM (TEE);  Surgeon: Lelon Perla, MD;  Location: [facility] ENDOSCOPY;  Service: Cardiovascular;  Laterality: N/A;  ? TONSILLECTOMY AND ADENOIDECTOMY  1950  ? at 88 years of age  ? ?Patient Active Problem List  ? Diagnosis Date Noted  ? Secondary hypercoagulable state ([place]) 09/05/2021  ? Persistent atrial fibrillation ([place]) 08/26/2021  ? Chronic insomnia 01/21/2021  ? Degeneration of lumbar intervertebral disc 02/03/2019  ? GERD (gastroesophageal reflux disease) 11/30/2017  ? Other persistent atrial fibrillation ([place])   ? Injury of muscle of rotator cuff 10/12/2017  ? Iron deficiency anemia, unspecified 03/24/2017  ? Poorly controlled type 2 diabetes mellitus ([place]) 02/23/2017  ? Mitral insufficiency 02/04/2016  ? Right bundle branch block 05/16/2015  ? Chest pain of unknown etiology  05/16/2015  ? Peripheral neuropathy 08/23/2014  ? Heart murmur previously undiagnosed 01/22/2014  ? Abdominal pain 11/11/2011  ? Ductal carcinoma in situ (DCIS) of left breast 01/12/2011  ? Hypercholesterolemia 11/28/2010  ? Hypothyroidism 11/28/2010  ? Postmenopausal state 11/28/2010  ? Osteoarthritis 11/28/2010  ? Dyspepsia 11/28/2010  ? ? ?REFERRING DIAG: Cervicalgia ? ?THERAPY DIAG:  ?Cervicalgia ? ?Abnormal posture ? ?Muscle weakness (generalized) ? ?PERTINENT HISTORY: AFib, osteopenia; neuropathy; diabetes, OA neck, scoliosis; husband passed away in Feb 20, 2023 married 3 years; dtr [name] brought her today since she is unable to drive ? ?PRECAUTIONS: Fall ? ?SUBJECTIVE:  Had MRI waiting on results.  Pt and caregiver report she does well after PT but then it doesn't stay.   ?PAIN:  ?Are you having pain? Yes midback with movement 5/10 ? ? ?PATIENT GOALS: wants to continue working; spend time with family friends ? ? ?OBJECTIVE:  ?  ?DIAGNOSTIC FINDINGS:  ?Arthritis in neck;no fracture per Dr. Tonita Cong ?  ?PATIENT SURVEYS:  ?FOTO 38% on 09/11/2021 ?  ?  ?COGNITION: ?Overall cognitive status: Within functional limits for tasks assessed ?           ?  ?POSTURE:  ?3/30: 4 cm occiput to wall ?09/11/2021: At rest head flexed 25 degrees flexion in sitting;  standing against wall at rest occiput 21cm from wall, with effort able to bring occiput 15cm from wall but unable to maintain 5 sec before head drops forward; inc thoracic kyphosis; right scoliosis  ?  ?PALPATION: ?Tender points in bil suboccipitals, upper traps   ?  ?CERVICAL AROM/PROM   ?  ?A/PROM A/PROM (deg) ?09/11/2021 A/ROM (deg)  ?09/18/2021 10/07/21  ?Flexion      ?Extension 33 ?  38 40  ?Right lateral flexion Unable to keep head in neutral for sidebend  15  ?Left lateral flexion    12  ?Right rotation 40 ?  45 38  ?Left rotation 35 40 20 ?  ? (Blank rows = not tested) ?  ?UE AROM/PROM: ?  ?A/PROM Right ?09/11/2021 Left ?09/11/2021 Right/left ?4/4  ?Shoulder flexion 120 in sitting;  150 in supine 120 in sitting; 150 in supine 128 bil  ?Shoulder extension       ?Shoulder abduction       ?Shoulder adduction       ?Shoulder extension       ?Shoulder internal rotation WFLs WFLs   ?Shoulder external rotation WFLs WFLs   ?Elbow flexion       ?Elbow extension       ?Wrist flexion       ?Wrist extension       ?Wrist ulnar deviation       ?Wrist radial deviation       ?Wrist pronation       ?Wrist supination       ? (Blank rows = not tested) ?  ?UE  MMT:  Cervical extensor strength for short term manual resistance is fair to good but she is unable to maintain pressure for durations > 15-20 sec  ?  ?MMT Right ?09/11/2021 Left ?09/11/2021  ?Shoulder flexion 4 4  ?Shoulder extension 4 4  ?Shoulder abduction 4 4  ?Shoulder adduction      ?Shoulder extension      ?Shoulder internal rotation      ?Shoulder external rotation      ?Middle trapezius      ?Lower trapezius      ?Elbow flexion      ?Elbow extension      ?Wrist flexion      ?Wrist extension      ?Wrist ulnar deviation      ?Wrist radial deviation      ?Wrist pronation      ?Wrist supination      ?Grip strength      ? (Blank rows = not tested) ?  ?CERVICAL SPECIAL TESTS:  ?Distraction test: Positive ?  ?  ?PATIENT SURVEYS:  ?FOTO 38%  ?  ?TODAY'S TREATMENT:  ?5/2: ?Supine with one flat pillow:  red band rows 10x  ?Supine 2 flat pillows: shoulder press down 5x  elbow press down 5x; palms up whole arm press down 5x; ?Supine one and a half flat pillow shoulder press cane  3# weight: cane  shoulder flexion 3#;  horizontal abduction cane 3# 10-15x each ?Supine red band one and a half flat pillow:  bil rows and bil shoulder extension  x10 each ; ?Supine bil horizontal abduction 2 flat pillows: yellow band 10x; yellow band external rotation 10x bil; yellow band overhead elevation 10x ?Supine head retractions against 2 flat pillows and then single pillow 10x each; ?Manual therapy with soft tissue mobilization to cervical paraspinals and upper traps with  suboccipital release for tissue elongation ?  ? ?4/27: ?Supine with one flat pillow:  red band rows 10x  ?Supine folded pillow elbow press down 10x; palms up whole arm press down 10x; ?Supine one flat pillow sho

## 2021-11-05 ENCOUNTER — Ambulatory Visit: Payer: Medicare Other | Admitting: Physician Assistant

## 2021-11-05 ENCOUNTER — Encounter: Payer: Self-pay | Admitting: Physician Assistant

## 2021-11-05 ENCOUNTER — Telehealth: Payer: Self-pay | Admitting: Pharmacist

## 2021-11-05 VITALS — BP 135/84 | HR 91 | Ht 61.0 in | Wt 121.0 lb

## 2021-11-05 DIAGNOSIS — I10 Essential (primary) hypertension: Secondary | ICD-10-CM | POA: Diagnosis not present

## 2021-11-05 DIAGNOSIS — Z79899 Other long term (current) drug therapy: Secondary | ICD-10-CM

## 2021-11-05 DIAGNOSIS — I4819 Other persistent atrial fibrillation: Secondary | ICD-10-CM

## 2021-11-05 DIAGNOSIS — I5032 Chronic diastolic (congestive) heart failure: Secondary | ICD-10-CM

## 2021-11-05 LAB — MAGNESIUM: Magnesium: 1.9 mg/dL (ref 1.6–2.3)

## 2021-11-05 NOTE — Chronic Care Management (AMB) (Signed)
? ? ?Chronic Care Management ?Pharmacy Assistant  ? ?Name: [facility]  MRN: [id number] DOB: 11-Jun-1930 ? ?Reason for Encounter: Medication Review Medication Coordination  ?  ?Conditions to be addressed/monitored: ?DMII ? ?Recent office visits:  ?10/20/21 Panosh, Standley Brooking, MD - Patient presented for Neck muscle weakness and other concerns. No medication changes. ? ?Recent consult visits:  ?11/05/21 Charlynn Grimes (Cardiology) - Patient presented for s/p Tikosyn and other concerns. Stopped Metformin Decreased Eliquis ? ?11/04/21 Alvera Singh, (PT) - Patient presented for Cervicalgia and other concerns. No medication changes. ? ?Hospital visits:  ?Medication Reconciliation was completed by comparing discharge summary, patient?s EMR and Pharmacy list, and upon discussion with patient. ?  ?Patient presented to [facility] on 08/26/21 due to Persistent Atrial Fibrillation. Patient was present for 3 days. ?  ?New?Medications Started at [facility] Discharge:?? ?-started  ?dofetilide (TIKOSYN) ?  ?Medication Changes at Hospital Discharge: ?-Changed  ?none ?  ?Medications Discontinued at Hospital Discharge: ?-Stopped  ?zolpidem 5 MG tablet ?  ?Medications that remain the same after Hospital Discharge:??  ?-All other medications will remain the same.   ?  ? ?Medications: ?Outpatient Encounter Medications as of 11/05/2021  ?Medication Sig  ? acetaminophen (TYLENOL) 500 MG tablet Take 1,000 mg by mouth every 6 (six) hours as needed for mild pain or moderate pain.   ? Calcium Carb-Cholecalciferol (CALCIUM+D3 PO) Take 1 tablet by mouth daily with breakfast.  ? Cholecalciferol (VITAMIN D3) 125 MCG (5000 UT) CAPS TAKE ONE CAPSULE BY MOUTH EVERY MORNING  ? dofetilide (TIKOSYN) 125 MCG capsule Take 1 capsule (125 mcg total) by mouth 2 (two) times daily.  ? ELIQUIS 5 MG TABS tablet TAKE ONE TABLET BY MOUTH EVERY MORNING and TAKE ONE TABLET BY MOUTH EVERY EVENING  ? ferrous sulfate 325 (65 FE) MG EC tablet Take  325 mg daily  ? glucose blood (ONE TOUCH ULTRA TEST) test strip Check once daily. E11.40  ? insulin degludec (TRESIBA FLEXTOUCH) 100 UNIT/ML FlexTouch Pen Inject 24 units into THE SKIN ONCE daily AT 10pm (Patient taking differently: 14-18 Units in the morning.)  ? Insulin Pen Needle 29G X 5MM MISC Use once daily  ? levothyroxine (SYNTHROID) 50 MCG tablet TAKE ONE TABLET BY MOUTH BEFORE BREAKFAST  ? lidocaine (LIDODERM) 5 % Place 1 patch onto the skin See admin instructions. Apply 1 patch to the neck every day  ? loratadine (CLARITIN) 10 MG tablet Take 10 mg by mouth daily as needed for allergies.  ? MAGNESIUM PO Take 1 tablet by mouth daily with breakfast.  ? metoprolol succinate (TOPROL-XL) 25 MG 24 hr tablet Take 0.5 tablets (12.5 mg total) by mouth every morning.  ? omeprazole (PRILOSEC) 20 MG capsule Take 1 capsule (20 mg total) by mouth as needed.  ? ONE TOUCH ULTRA TEST test strip CHECK ONCE A DAY  ? potassium chloride (KLOR-CON) 10 MEQ tablet Take 1 tablet (10 mEq total) by mouth 2 (two) times daily.  ? torsemide (DEMADEX) 20 MG tablet Take 1 tablet (20 mg total) by mouth daily as needed. Take daily prn weight gain or swelling  ? vitamin B-12 (CYANOCOBALAMIN) 1000 MCG tablet Take 1 tablet (1,000 mcg total) by mouth daily.  ? ?No facility-administered encounter medications on file as of 11/05/2021.  ? ?Recent Relevant Labs: ?Lab Results  ?Component Value Date/Time  ? HGBA1C 7.4 (H) 10/20/2021 03:13 PM  ? HGBA1C 8.4 (H) 08/28/2021 03:03 AM  ? MICROALBUR 0.9 04/07/2013 07:58 AM  ?  ?  Kidney Function ?Lab Results  ?Component Value Date/Time  ? CREATININE 0.68 10/20/2021 03:13 PM  ? CREATININE 0.93 09/05/2021 10:20 AM  ? CREATININE 0.81 04/22/2020 04:17 PM  ? CREATININE 0.69 07/30/2016 08:08 AM  ? CREATININE 0.8 04/03/2014 10:26 AM  ? CREATININE 0.8 03/20/2013 09:10 AM  ? GFR 77.15 10/20/2021 03:13 PM  ? GFRNONAA 59 (L) 09/05/2021 10:20 AM  ? GFRAA 75 06/13/2020 02:15 PM  ? ? ?Current antihyperglycemic regimen:   ?Metformin ER '500mg'$ , 1 tablet twice daily - taking once a day ?[name] Flextouch, inject 24 units once daily at 10 PM  ?What recent interventions/DTPs have been made to improve glycemic control:  ?Stopped Metformin (stomach upset)/ Decreased Eliquis ?Notes: ?Call to [place] for Updated dose of decreased Eliquis to be sent over to the pharmacy  ? ?Reviewed chart for medication changes ahead of medication coordination call. ? ?No OVs, Consults, or hospital visits since last care coordination call/Pharmacist visit.  ? ?No medication changes indicated. ?BP Readings from Last 3 Encounters:  ?11/05/21 135/84  ?10/20/21 134/76  ?09/16/21 100/60  ?  ?Lab Results  ?Component Value Date  ? HGBA1C 7.4 (H) 10/20/2021  ?  ? ?Patient obtains medications through Adherence Packaging  30 Days  ? ?Last adherence delivery included:  ? ?Potassium Chloride 10 meq- take 2 tablets at breakfast and one tablet at dinner (total 30 mEq) ?Metoprolol ER '25mg'$  - take half tablet daily with breakfast ?Levothyroxine 90mg - take 1 tablet before breakfast ?Eliquis '5mg'$  - take 1 tablet with breakfast and 1 tablet with dinner ?  ? ?Patient declined the following medications (meds) due to (reason) ?TTyler AasFlextouch 100u - Inject 24 units once daily (still has 2.5) ?Metformin 500 mg -   take one tablet at dinner (pt stopped due to diarrhea does not want to continue has had that happen before) ?Torsemide '20mg'$  - take one tablet daily PRN (on hand supply) ?  ?Patient is due for next adherence delivery on: 11/17/21. ?Called patient and reviewed medications and coordinated delivery. ?Packs 30 DS ? ?This delivery to include: ?Potassium Chloride 10 meq- take 1 tablet at breakfast and one tablet at dinner  ?Metoprolol ER '25mg'$  - take half tablet daily with breakfast ?Levothyroxine 524m - take 1 tablet before breakfast ?Eliquis '5mg'$  - take 1 tablet with breakfast and 1 tablet with dinner ? ?DOFETILIDE  125 MCG CAPS 09/25/2021 90  ?Patient reports she uses  good rx for the above medication does not need transfer. ? ? ?Patient declined the following medications (meds) due to (reason) ?TrTyler Aaslextouch 100u - Inject 24 units once daily  ?Torsemide '20mg'$  - take one tablet daily PRN  ? ? ?Confirmed delivery date of 11/17/21, advised patient that pharmacy will contact them the morning of delivery.  ? ?Notes: ?Patient made aware of dose decrease to 2.5 mg twice daily for her Eliquis she wants to use what she has up so will cut them in half and wait for the delivery for the updated dosing in her packaging. ? ?Care Gaps: ?BP- 135/84 ( 11/05/21) ?AWV- 3/22 ?CCM- 6/23 ?Lab Results  ?Component Value Date  ? HGBA1C 7.4 (H) 10/20/2021  ? ? ? ?Star Rating Drugs: ?None ? ? ?LaNed ClinesMA ?Clinical Pharmacist Assistant ?[phone] ?

## 2021-11-05 NOTE — Patient Instructions (Signed)
Medication Instructions:  ? ?Your physician recommends that you continue on your current medications as directed. Please refer to the Current Medication list given to you today. ? ? ?*If you need a refill on your cardiac medications before your next appointment, please call your pharmacy* ? ? ?Lab Work: MAG LEVEL  ? ? ?If you have labs (blood work) drawn today and your tests are completely normal, you will receive your results only by: ?MyChart Message (if you have MyChart) OR ?A paper copy in the mail ?If you have any lab test that is abnormal or we need to change your treatment, we will call you to review the results. ? ? ?Testing/Procedures: NONE ORDERED  TODAY ? ? ? ? ?Follow-Up: ?At [facility], you and your health needs are our priority.  As part of our continuing mission to provide you with exceptional heart care, we have created designated Provider Care Teams.  These Care Teams include your primary Cardiologist (physician) and Advanced Practice Providers (APPs -  Physician Assistants and Nurse Practitioners) who all work together to provide you with the care you need, when you need it. ? ?We recommend signing up for the patient portal called "MyChart".  Sign up information is provided on this After Visit Summary.  MyChart is used to connect with patients for Virtual Visits (Telemedicine).  Patients are able to view lab/test results, encounter notes, upcoming appointments, etc.  Non-urgent messages can be sent to your provider as well.   ?To learn more about what you can do with MyChart, go to [email or web address].   ? ?Your next appointment:   ?3 month(s) ? ?The format for your next appointment:   ?In Person ? ?Provider:   ?You may see Dr. Curt Bears for one of the following Advanced Practice Providers on your designated Care Team:   ?Tommye Standard, PA-C ? ? ? :1}  ? ? ? ? ?Important Information About Sugar ? ? ? ? ?  ?

## 2021-11-05 NOTE — Telephone Encounter (Signed)
I spoke with the pt and she stated Dr. Susa Day with [facility] ordered MRI.  ?

## 2021-11-05 NOTE — Telephone Encounter (Signed)
Left a message for the to return my call.  ?

## 2021-11-05 NOTE — Telephone Encounter (Signed)
Pt has been added to schedule for 11/10/2021 ?

## 2021-11-06 ENCOUNTER — Ambulatory Visit: Payer: Medicare Other | Admitting: Physical Therapy

## 2021-11-06 ENCOUNTER — Telehealth: Payer: Self-pay | Admitting: Cardiology

## 2021-11-06 DIAGNOSIS — M542 Cervicalgia: Secondary | ICD-10-CM

## 2021-11-06 DIAGNOSIS — R293 Abnormal posture: Secondary | ICD-10-CM | POA: Diagnosis not present

## 2021-11-06 DIAGNOSIS — M6281 Muscle weakness (generalized): Secondary | ICD-10-CM

## 2021-11-06 MED ORDER — APIXABAN 2.5 MG PO TABS: ORAL_TABLET | ORAL | 2 refills | Status: AC

## 2021-11-06 NOTE — Telephone Encounter (Signed)
Spoke with [facility] and Rx Eliquis 2.5 mg is received with they  will contact patient  Rx received and packaged appropriately and per Renee patient was instructed to cut 5 mg in half until new Rx arrived. ?

## 2021-11-06 NOTE — Telephone Encounter (Signed)
Spoke with Julie Norton, aware will forward the information to the pharmacist to help with correct eliquis dosage. ?

## 2021-11-06 NOTE — Telephone Encounter (Signed)
Pt c/o medication issue: ? ?1. Name of Medication: ELIQUIS 5 MG TABS tablet ? ?2. How are you currently taking this medication (dosage and times per day)? 1 tablet twice a day ? ?3. Are you having a reaction (difficulty breathing--STAT)? no ? ?4. What is your medication issue? Larissa from [facility] states the patient informed them a new prescription for the eliquis was sent by the office, but they have not received one. She states what they have on file is once 5 mg tablet of eliquis twice daily. Phone: [phone]  ?

## 2021-11-06 NOTE — Therapy (Signed)
?OUTPATIENT PHYSICAL THERAPY TREATMENT NOTE/Recertification ? ? ?Patient Name: Julie Norton ?MRN: [id number] ?DOB:1929/07/16, 88 y.o., female ?Today's Date: 11/06/2021 ? ?PCP: Eulas Post, MD ?REFERRING PROVIDER: Susa Day, MD ? ?  ? ? PT End of Session - 11/06/21 1449   ? ? Visit Number 11   ? Date for PT Re-Evaluation 01/01/22   ? Authorization Type [employer] Medicare   ? PT Start Time 1448   ? PT Stop Time 1526   ? PT Time Calculation (min) 38 min   ? Activity Tolerance Patient tolerated treatment well   ? ?  ?  ? ?  ? ? ?Past Medical History:  ?Diagnosis Date  ? Abdominal adhesions   ? Abdominal gas pain   ? suspected -- may be related to intermittent right upper quadrant discomfort radiating around her back      ? Anemia   ? Anginal pain ([place])   ? Arthritis   ? Atrial fibrillation ([place])   ? Breast cancer ([place])   ? Breast mass   ? left breast  ? Cancer ([place])   ? left breast  ? Chest pain   ? intermittent right upper quadrant discomfort radiating around her back      ? Complication of anesthesia   ? Diabetes mellitus without complication ([place])   ? Type II  ? Dyspepsia   ? Dyspnea   ? GERD (gastroesophageal reflux disease)   ? H/O: hysterectomy 1978  ? History of cardioversion   ? Hypercholesteremia   ? Hypothyroidism   ? Mitral insufficiency   ? Personal history of radiation therapy   ? Pneumonia   ? PONV (postoperative nausea and vomiting)   ? Right bundle branch block (RBBB)   ? Tendinitis   ? of the right arm and shoulder  ? Torn tendon   ? right shoulder  ? ?Past Surgical History:  ?Procedure Laterality Date  ? ABDOMINAL HYSTERECTOMY    ? APPENDECTOMY  1978  ? BLADDER REPAIR    ? BREAST LUMPECTOMY Left 02/24/2011  ? central partial mastectomy - dr Margot Chimes  ? BREAST REDUCTION SURGERY    ? CARDIOVERSION N/A 10/28/2017  ? Procedure: CARDIOVERSION;  Surgeon: Lelon Perla, MD;  Location: [facility] ENDOSCOPY;  Service: Cardiovascular;  Laterality: N/A;  ? CARDIOVERSION N/A 04/06/2019  ? Procedure: CARDIOVERSION;   Surgeon: [name], MD;  Location: [place];  Service: Cardiovascular;  Laterality: N/A;  ? CARDIOVERSION N/A 09/08/2019  ? Procedure: CARDIOVERSION;  Surgeon: Dorothy Spark, MD;  Location: [place];  Service: Cardiovascular;  Laterality: N/A;  ? CARDIOVERSION N/A 08/28/2021  ? Procedure: CARDIOVERSION;  Surgeon: [name], MD;  Location: [facility] ENDOSCOPY;  Service: Cardiovascular;  Laterality: N/A;  ? COLONOSCOPY WITH PROPOFOL N/A 03/24/2017  ? Procedure: COLONOSCOPY WITH PROPOFOL;  Surgeon: Wilford Corner, MD;  Location: [place];  Service: Endoscopy;  Laterality: N/A;  ? ESOPHAGOGASTRODUODENOSCOPY (EGD) WITH PROPOFOL N/A 03/24/2017  ? Procedure: ESOPHAGOGASTRODUODENOSCOPY (EGD) WITH PROPOFOL;  Surgeon: Wilford Corner, MD;  Location: [place];  Service: Endoscopy;  Laterality: N/A;  ? LIPOMA EXCISION Right   ? REDUCTION MAMMAPLASTY Bilateral 1998  ? RETINAL DETACHMENT SURGERY Left   ? SALPINGOOPHORECTOMY    ? TEE WITHOUT CARDIOVERSION N/A 10/28/2017  ? Procedure: TRANSESOPHAGEAL ECHOCARDIOGRAM (TEE);  Surgeon: Lelon Perla, MD;  Location: [facility] ENDOSCOPY;  Service: Cardiovascular;  Laterality: N/A;  ? [place]  ? at 88 years of age  ? ?Patient Active Problem List  ? Diagnosis Date Noted  ?  Secondary hypercoagulable state ([place]) 09/05/2021  ? Persistent atrial fibrillation ([place]) 08/26/2021  ? Chronic insomnia 01/21/2021  ? Degeneration of lumbar intervertebral disc 02/03/2019  ? GERD (gastroesophageal reflux disease) 11/30/2017  ? Other persistent atrial fibrillation ([place])   ? Injury of muscle of rotator cuff 10/12/2017  ? Iron deficiency anemia, unspecified 03/24/2017  ? Poorly controlled type 2 diabetes mellitus ([place]) 02/23/2017  ? Mitral insufficiency 02/04/2016  ? Right bundle branch block 05/16/2015  ? Chest pain of unknown etiology 05/16/2015  ? Peripheral neuropathy 08/23/2014  ? Heart murmur previously undiagnosed 01/22/2014  ?  Abdominal pain 11/11/2011  ? Ductal carcinoma in situ (DCIS) of left breast 01/12/2011  ? Hypercholesterolemia 11/28/2010  ? Hypothyroidism 11/28/2010  ? Postmenopausal state 11/28/2010  ? Osteoarthritis 11/28/2010  ? Dyspepsia 11/28/2010  ? ? ?REFERRING DIAG: Cervicalgia ? ?THERAPY DIAG:  ?Cervicalgia ? ?Abnormal posture ? ?Muscle weakness (generalized) ? ?PERTINENT HISTORY: AFib, osteopenia; neuropathy; diabetes, OA neck, scoliosis; husband passed away in Feb 26, 2023 married 24 years; dtr [name] brought her today since she is unable to drive ? ?PRECAUTIONS: Fall ? ?SUBJECTIVE:  Getting in and out of the car causes increased mid back soreness.  Able to go off A fib medicine. ?PAIN:  ?Are you having pain? Yes midback with movement 5/10 ? ? ?PATIENT GOALS: wants to continue working; spend time with family friends ? ? ?OBJECTIVE:  ?  ?DIAGNOSTIC FINDINGS:  ?Arthritis in neck;no fracture per Dr. Tonita Cong ?  ?PATIENT SURVEYS:  ?FOTO 38% on 09/11/2021 ?47% on 5/4 ?  ?  ?COGNITION: ?Overall cognitive status: Within functional limits for tasks assessed ?           ?  ?POSTURE:  ?5/4: 12 cm occiput to wall ?3/30: 4 cm occiput to wall ?09/11/2021: At rest head flexed 25 degrees flexion in sitting;  standing against wall at rest occiput 21cm from wall, with effort able to bring occiput 15cm from wall but unable to maintain 5 sec before head drops forward; inc thoracic kyphosis; right scoliosis  ?  ?PALPATION: ?Tender points in bil suboccipitals, upper traps   ?  ?CERVICAL AROM/PROM   able to hold head neutral  ?  ?A/PROM A/PROM (deg) ?09/11/2021 A/ROM (deg)  ?09/18/2021 10/07/21 5/4  ?Flexion       ?Extension 33 ?  38 40 40  ?Right lateral flexion Unable to keep head in neutral for sidebend  15 25  ?Left lateral flexion    12 10  ?Right rotation 40 ?  45 38 50  ?Left rotation 35 40 20 ? 40  ? (Blank rows = not tested) ?  ?UE AROM/PROM: ?  ?A/PROM Right ?09/11/2021 Left ?09/11/2021 Right/left ?4/4 5/4  ?Shoulder flexion 120 in sitting; 150 in supine  120 in sitting; 150 in supine 128 bil 125 in sitting  ?Shoulder extension        ?Shoulder abduction        ?Shoulder adduction        ?Shoulder extension        ?Shoulder internal rotation WFLs WFLs    ?Shoulder external rotation WFLs WFLs    ?Elbow flexion        ?Elbow extension        ?Wrist flexion        ?Wrist extension        ?Wrist ulnar deviation        ?Wrist radial deviation        ?Wrist pronation        ?Wrist  supination        ? (Blank rows = not tested) ?  ?UE MMT:  Cervical extensor strength for short term manual resistance is fair to good but she is unable to maintain pressure for durations > 15-20 sec  ?  ?MMT Right ?09/11/2021 5/4 Left ?09/11/2021 5/4  ?Shoulder flexion '4 4 4 4  '$ ?Shoulder extension '4 4 4 4  '$ ?Shoulder abduction '4 4 4 4  '$ ?Shoulder adduction        ?Shoulder extension        ?Shoulder internal rotation        ?Shoulder external rotation        ?Middle trapezius        ?Lower trapezius        ?Elbow flexion        ?Elbow extension        ?Wrist flexion        ?Wrist extension        ?Wrist ulnar deviation        ?Wrist radial deviation        ?Wrist pronation        ?Wrist supination        ?Grip strength        ? (Blank rows = not tested) ?  ?CERVICAL SPECIAL TESTS:  ?Distraction test: Positive ?  ?  ?PATIENT SURVEYS:  ?FOTO 38% on eval ?11/06/21: 47% ?  ?TODAY'S TREATMENT:  ?5/4:    ?Supine with one flat pillow:  red band rows 10x  ?Supine 2 flat pillows: shoulder press down 5x  elbow press down 5x; palms up whole arm press down 5x; ?Supine one and a half flat pillow shoulder press cane  3# weight 15x ?Supine red band one and a half flat pillow:  bil rows and bil shoulder extension  x10 each ; ?Supine head retractions against 2 flat pillows and then single pillow 10x each; ?Manual therapy with soft tissue mobilization to cervical paraspinals and upper traps with suboccipital release for tissue elongation ? See objective findings above ? ? ? ? ?5/2: ?Supine with one flat pillow:  red band  rows 10x  ?Supine 2 flat pillows: shoulder press down 5x  elbow press down 5x; palms up whole arm press down 5x; ?Supine one and a half flat pillow shoulder press cane  3# weight: cane  shoulder flexion 3#;

## 2021-11-06 NOTE — Telephone Encounter (Signed)
Weight has been < 60kg consistently since January 2023, age also > 82 so pt qualifies for lower Eliquis dose of 2.'5mg'$  BID for her afib. ? ?Pt saw EP yesterday: "Her Eliquis dose is high for her, and neglected to reduce the dose while she was here.  I did call and discuss it with her over the phone and will have my MA send in a new Rx for her." ? ?Does not look like new rx was sent in yesterday, will forward for follow up. ?

## 2021-11-06 NOTE — Addendum Note (Signed)
Addended by: Claude Manges on: 11/06/2021 02:24 PM ? ? Modules accepted: Orders ? ?

## 2021-11-10 ENCOUNTER — Ambulatory Visit (INDEPENDENT_AMBULATORY_CARE_PROVIDER_SITE_OTHER): Payer: Medicare Other

## 2021-11-10 ENCOUNTER — Encounter: Payer: Self-pay | Admitting: Family Medicine

## 2021-11-10 ENCOUNTER — Ambulatory Visit (INDEPENDENT_AMBULATORY_CARE_PROVIDER_SITE_OTHER): Payer: Medicare Other | Admitting: Family Medicine

## 2021-11-10 VITALS — BP 136/70 | HR 90 | Temp 97.9°F | Ht 61.0 in | Wt 118.4 lb

## 2021-11-10 DIAGNOSIS — E1165 Type 2 diabetes mellitus with hyperglycemia: Secondary | ICD-10-CM | POA: Diagnosis not present

## 2021-11-10 DIAGNOSIS — R634 Abnormal weight loss: Secondary | ICD-10-CM | POA: Diagnosis not present

## 2021-11-10 DIAGNOSIS — M25551 Pain in right hip: Secondary | ICD-10-CM

## 2021-11-10 DIAGNOSIS — R627 Adult failure to thrive: Secondary | ICD-10-CM | POA: Diagnosis not present

## 2021-11-10 DIAGNOSIS — S3993XA Unspecified injury of pelvis, initial encounter: Secondary | ICD-10-CM | POA: Diagnosis not present

## 2021-11-10 NOTE — Progress Notes (Signed)
? ?Established Patient Office Visit ? ?Subjective   ?Patient ID: Julie Norton, female    DOB: May 16, 1930  Age: 88 y.o. MRN: [id number] ? ?Chief Complaint  ?Patient presents with  ? Follow-up  ? ? ?HPI ? ? ?Patient seen for medical follow-up.  She has multiple chronic problems including history of atrial fibrillation, GERD, type 2 diabetes, hypothyroidism, peripheral neuropathy, osteoarthritis, chronic insomnia.  She came in recently was seen on the 17th with general failure to thrive and malaise.  She had multiple labs.  B12 was normal.  No iron deficiency.  Thyroid and chemistries normal.  A1c improved at 7.4. ? ?She has been having some progressive neck difficulty with difficulty extending the neck.  She is seeing orthopedic back specialist and reportedly had MRI last week and has follow-up with them later this week.  She has been getting some physical therapy and massage.  She feels like inability to move the neck has created difficulties with eating.  She has had very poor appetite.  Has lost 132 pounds last November down to current weight of 118 pounds. ? ?She has type 2 diabetes which has been poorly controlled in the past.  Because of her weight loss she has reduced her insulin down to 10 units daily.  No recent hypoglycemia.  She stopped metformin.  She had TSH back in December which was normal range. ? ?Ongoing problems with insomnia.  She was taken off sleep medication because of potential interaction with Tikosyn.  She is not sure she feels any better since starting the Tikosyn. ? ?She is widowed and lives alone.  She has a very supportive neighbor who brings her in today.  She also has supportive family.  She has a couple of sons that live in town ? ?She did have a fall in her bathroom last Saturday.  She has been able to ambulate but is having some right pelvic pain since then more anterior pelvis.  No visible bruising.  No head injury reported. ? ?Past Medical History:  ?Diagnosis Date  ? Abdominal  adhesions   ? Abdominal gas pain   ? suspected -- may be related to intermittent right upper quadrant discomfort radiating around her back      ? Anemia   ? Anginal pain ([place])   ? Arthritis   ? Atrial fibrillation ([place])   ? Breast cancer ([place])   ? Breast mass   ? left breast  ? Cancer ([place])   ? left breast  ? Chest pain   ? intermittent right upper quadrant discomfort radiating around her back      ? Complication of anesthesia   ? Diabetes mellitus without complication ([place])   ? Type II  ? Dyspepsia   ? Dyspnea   ? GERD (gastroesophageal reflux disease)   ? H/O: hysterectomy 1978  ? History of cardioversion   ? Hypercholesteremia   ? Hypothyroidism   ? Mitral insufficiency   ? Personal history of radiation therapy   ? Pneumonia   ? PONV (postoperative nausea and vomiting)   ? Right bundle branch block (RBBB)   ? Tendinitis   ? of the right arm and shoulder  ? Torn tendon   ? right shoulder  ? ?Past Surgical History:  ?Procedure Laterality Date  ? ABDOMINAL HYSTERECTOMY    ? APPENDECTOMY  1978  ? BLADDER REPAIR    ? BREAST LUMPECTOMY Left 02/24/2011  ? central partial mastectomy - dr Margot Chimes  ? BREAST REDUCTION SURGERY    ?  CARDIOVERSION N/A 10/28/2017  ? Procedure: CARDIOVERSION;  Surgeon: Lelon Perla, MD;  [facility] ENDOSCOPY;  Service: Cardiovascular;  Laterality: N/A;  ? CARDIOVERSION N/A 04/06/2019  ? Procedure: CARDIOVERSION;  Surgeon: [name], MD;  Location: [place];  Service: Cardiovascular;  Laterality: N/A;  ? CARDIOVERSION N/A 09/08/2019  ? Procedure: CARDIOVERSION;  Surgeon: Dorothy Spark, MD;  Location: [place];  Service: Cardiovascular;  Laterality: N/A;  ? CARDIOVERSION N/A 08/28/2021  ? Procedure: CARDIOVERSION;  Surgeon: [name], MD;  Location: [facility] ENDOSCOPY;  Service: Cardiovascular;  Laterality: N/A;  ? COLONOSCOPY WITH PROPOFOL N/A 03/24/2017  ? Procedure: COLONOSCOPY WITH PROPOFOL;  Surgeon: Wilford Corner, MD;  Location: [place];  Service:  Endoscopy;  Laterality: N/A;  ? ESOPHAGOGASTRODUODENOSCOPY (EGD) WITH PROPOFOL N/A 03/24/2017  ? Procedure: ESOPHAGOGASTRODUODENOSCOPY (EGD) WITH PROPOFOL;  Surgeon: Wilford Corner, MD;  Location: [place];  Service: Endoscopy;  Laterality: N/A;  ? LIPOMA EXCISION Right   ? REDUCTION MAMMAPLASTY Bilateral 1998  ? RETINAL DETACHMENT SURGERY Left   ? SALPINGOOPHORECTOMY    ? TEE WITHOUT CARDIOVERSION N/A 10/28/2017  ? Procedure: TRANSESOPHAGEAL ECHOCARDIOGRAM (TEE);  Surgeon: Lelon Perla, MD;  Location[facility] ENDOSCOPY;  Service: Cardiovascular;  Laterality: N/A;  ? [place]  ? at 88 years of age  ? ? reports that she has never smoked. She has never used smokeless tobacco. She reports that she does not drink alcohol and does not use drugs. ?family history includes Aortic aneurysm in her mother and sister; Cancer in her mother; Diabetes in her mother; Heart attack in her mother; Hypertension in her mother; Pneumonia in her father; Stroke in her mother. ?Allergies  ?Allergen Reactions  ? Bactrim Nausea And Vomiting  ? Dilaudid [Hydromorphone Hcl] Nausea Only  ? Hydrocodone Nausea And Vomiting  ? Invokana [Canagliflozin] Itching and Other (See Comments)  ?  Yeast Infections ?  ? Macrodantin Nausea And Vomiting  ? ? ?Review of Systems  ?Constitutional:  Positive for malaise/fatigue and weight loss. Negative for chills and fever.  ?Respiratory:  Negative for shortness of breath.   ?Cardiovascular:  Negative for chest pain and leg swelling.  ?Gastrointestinal:  Negative for abdominal pain, nausea and vomiting.  ?Genitourinary:  Negative for dysuria.  ? ?  ?Objective:  ?  ? ?BP 136/70 (BP Location: Left Arm, Patient Position: Sitting, Cuff Size: Normal)   Pulse 90   Temp 97.9 ?F (36.6 ?C) (Oral)   Ht '5\' 1"'$  (1.549 m)   Wt 118 lb 6.4 oz (53.7 kg)   SpO2 97%   BMI 22.37 kg/m?  ?Wt Readings from Last 3 Encounters:  ?11/10/21 118 lb 6.4 oz (53.7 kg)  ?11/05/21 121 lb (54.9 kg)  ?10/20/21  121 lb 9.6 oz (55.2 kg)  ? ?  ? ?Physical Exam ?Vitals reviewed.  ?Constitutional:   ?   Comments: She has neck collar on and very stiff posture.  ?Cardiovascular:  ?   Rate and Rhythm: Normal rate and regular rhythm.  ?Pulmonary:  ?   Effort: Pulmonary effort is normal.  ?   Breath sounds: Normal breath sounds.  ?Musculoskeletal:  ?   Right lower leg: No edema.  ?   Left lower leg: No edema.  ? ? ? ?No results found for any visits on 11/10/21. ? ?Last CBC ?Lab Results  ?Component Value Date  ? WBC 6.5 10/20/2021  ? HGB 13.8 10/20/2021  ? HCT 39.6 10/20/2021  ? MCV 88.7 10/20/2021  ? MCH 20.8 (L) 06/13/2021  ?  RDW 14.1 10/20/2021  ? PLT 172.0 10/20/2021  ? ?Last metabolic panel ?Lab Results  ?Component Value Date  ? GLUCOSE 86 10/20/2021  ? NA 140 10/20/2021  ? K 4.5 10/20/2021  ? CL 104 10/20/2021  ? CO2 28 10/20/2021  ? BUN 12 10/20/2021  ? CREATININE 0.68 10/20/2021  ? GFRNONAA 59 (L) 09/05/2021  ? CALCIUM 10.2 10/20/2021  ? PROT 6.4 12/11/2019  ? ALBUMIN 4.1 12/11/2019  ? LABGLOB 2.3 12/11/2019  ? AGRATIO 1.8 12/11/2019  ? BILITOT 0.4 12/11/2019  ? ALKPHOS 87 12/11/2019  ? AST 39 12/11/2019  ? ALT 32 12/11/2019  ? ANIONGAP 9 09/05/2021  ? ?Last hemoglobin A1c ?Lab Results  ?Component Value Date  ? HGBA1C 7.4 (H) 10/20/2021  ? ?Last thyroid functions ?Lab Results  ?Component Value Date  ? TSH 2.660 06/13/2021  ? ?  ? ?The ASCVD Risk score (Arnett DK, et al., 2019) failed to calculate for the following reasons: ?  The 2019 ASCVD risk score is only valid for ages 67 to 70 ? ?  ?Assessment & Plan:  ? ?#1 adult failure to thrive with some recent progressive decline in appetite and weight loss.  Difficult to sort out how much of this is related to her neck predicament but she relates this to difficulty moving her neck and difficulty consuming solid foods.  No true dysphagia.. ?-We have suggested nutritional supplements such as Glucerna ?-We also suggested they consider some smoothies or high-protein nutritional shakes  with keep an eye on glycemic content. ?-Set up follow-up in 2 months to reassess.  Consider albumin level then ? ?#2 type 2 diabetes which has improved some with recent A1c 7.4% with her weight loss.  Cornelius Moras

## 2021-11-10 NOTE — Patient Instructions (Signed)
Consider nutritional supplement such as Glucerna ? ?Consider healthy smoothie/protein shake options- as discussed.  ?

## 2021-11-11 ENCOUNTER — Ambulatory Visit: Payer: Medicare Other | Admitting: Physical Therapy

## 2021-11-11 ENCOUNTER — Encounter: Payer: Self-pay | Admitting: Family Medicine

## 2021-11-11 DIAGNOSIS — M6281 Muscle weakness (generalized): Secondary | ICD-10-CM | POA: Diagnosis not present

## 2021-11-11 DIAGNOSIS — R293 Abnormal posture: Secondary | ICD-10-CM

## 2021-11-11 DIAGNOSIS — M542 Cervicalgia: Secondary | ICD-10-CM

## 2021-11-11 DIAGNOSIS — R269 Unspecified abnormalities of gait and mobility: Secondary | ICD-10-CM | POA: Diagnosis not present

## 2021-11-11 DIAGNOSIS — R531 Weakness: Secondary | ICD-10-CM

## 2021-11-11 NOTE — Therapy (Signed)
?OUTPATIENT PHYSICAL THERAPY TREATMENT NOTE ? ? ?Patient Name: Julie Norton ?MRN: [id number] ?DOB:[birth year], 88 y.o., female ?Today's Date: 11/11/2021 ? ?PCP: Eulas Post, MD ?REFERRING PROVIDER: Susa Day, MD ? ?  ? ? PT End of Session - 11/06/21 1449   ? ? Visit Number 11   ? Date for PT Re-Evaluation 01/01/22   ? Authorization Type UHC Medicare   ? PT Start Time 1448   ? PT Stop Time 1526   ? PT Time Calculation (min) 38 min   ? Activity Tolerance Patient tolerated treatment well   ? ?  ?  ? ?  ? ? ?Past Medical History:  ?Diagnosis Date  ? Abdominal adhesions   ? Abdominal gas pain   ? suspected -- may be related to intermittent right upper quadrant discomfort radiating around her back      ? Anemia   ? Anginal pain ([place])   ? Arthritis   ? Atrial fibrillation ([place])   ? Breast cancer ([place])   ? Breast mass   ? left breast  ? Cancer ([place])   ? left breast  ? Chest pain   ? intermittent right upper quadrant discomfort radiating around her back      ? Complication of anesthesia   ? Diabetes mellitus without complication ([place])   ? Type II  ? Dyspepsia   ? Dyspnea   ? GERD (gastroesophageal reflux disease)   ? H/O: hysterectomy 1978  ? History of cardioversion   ? Hypercholesteremia   ? Hypothyroidism   ? Mitral insufficiency   ? Personal history of radiation therapy   ? Pneumonia   ? PONV (postoperative nausea and vomiting)   ? Right bundle branch block (RBBB)   ? Tendinitis   ? of the right arm and shoulder  ? Torn tendon   ? right shoulder  ? ?Past Surgical History:  ?Procedure Laterality Date  ? ABDOMINAL HYSTERECTOMY    ? APPENDECTOMY  1978  ? BLADDER REPAIR    ? BREAST LUMPECTOMY Left 02/24/2011  ? central partial mastectomy - dr Margot Chimes  ? BREAST REDUCTION SURGERY    ? CARDIOVERSION N/A 10/28/2017  ? Procedure: CARDIOVERSION;  Surgeon: Lelon Perla, MD;  Location: [facility] ENDOSCOPY;  Service: Cardiovascular;  Laterality: N/A;  ? CARDIOVERSION N/A 04/06/2019  ? Procedure: CARDIOVERSION;  Surgeon:  [name], MD;  Location: [place];  Service: Cardiovascular;  Laterality: N/A;  ? CARDIOVERSION N/A 09/08/2019  ? Procedure: CARDIOVERSION;  Surgeon: Dorothy Spark, MD;  Location: [place];  Service: Cardiovascular;  Laterality: N/A;  ? CARDIOVERSION N/A 08/28/2021  ? Procedure: CARDIOVERSION;  Surgeon: [name], MD;  Location[facility] ENDOSCOPY;  Service: Cardiovascular;  Laterality: N/A;  ? COLONOSCOPY WITH PROPOFOL N/A 03/24/2017  ? Procedure: COLONOSCOPY WITH PROPOFOL;  Surgeon: Wilford Corner, MD;  Location: [place];  Service: Endoscopy;  Laterality: N/A;  ? ESOPHAGOGASTRODUODENOSCOPY (EGD) WITH PROPOFOL N/A 03/24/2017  ? Procedure: ESOPHAGOGASTRODUODENOSCOPY (EGD) WITH PROPOFOL;  Surgeon: Wilford Corner, MD;  Location: [place];  Service: Endoscopy;  Laterality: N/A;  ? LIPOMA EXCISION Right   ? REDUCTION MAMMAPLASTY Bilateral 1998  ? RETINAL DETACHMENT SURGERY Left   ? SALPINGOOPHORECTOMY    ? TEE WITHOUT CARDIOVERSION N/A 10/28/2017  ? Procedure: TRANSESOPHAGEAL ECHOCARDIOGRAM (TEE);  Surgeon: Lelon Perla, MD;  Location: [facility] ENDOSCOPY;  Service: Cardiovascular;  Laterality: N/A;  ? [place]  ? at 88 years of age  ? ?Patient Active Problem List  ? Diagnosis Date Noted  ?  Secondary hypercoagulable state ([place]) 09/05/2021  ? Persistent atrial fibrillation ([place]) 08/26/2021  ? Chronic insomnia 01/21/2021  ? Degeneration of lumbar intervertebral disc 02/03/2019  ? GERD (gastroesophageal reflux disease) 11/30/2017  ? Other persistent atrial fibrillation ([place])   ? Injury of muscle of rotator cuff 10/12/2017  ? Iron deficiency anemia, unspecified 03/24/2017  ? Poorly controlled type 2 diabetes mellitus ([place]) 02/23/2017  ? Mitral insufficiency 02/04/2016  ? Right bundle branch block 05/16/2015  ? Chest pain of unknown etiology 05/16/2015  ? Peripheral neuropathy 08/23/2014  ? Heart murmur previously undiagnosed 01/22/2014  ? Abdominal pain  11/11/2011  ? Ductal carcinoma in situ (DCIS) of left breast 01/12/2011  ? Hypercholesterolemia 11/28/2010  ? Hypothyroidism 11/28/2010  ? Postmenopausal state 11/28/2010  ? Osteoarthritis 11/28/2010  ? Dyspepsia 11/28/2010  ? ? ?REFERRING DIAG: Cervicalgia ? ?THERAPY DIAG:  ?Cervicalgia ? ?Abnormal posture ? ?Muscle weakness (generalized) ? ?PERTINENT HISTORY: AFib, osteopenia; neuropathy; diabetes, OA neck, scoliosis; husband passed away in 2023-03-02 married 55 years; dtr [name] brought her today since she is unable to drive ? ?PRECAUTIONS: Fall ? ?SUBJECTIVE:  Got MRI results some narrowing identified.  Using soft cervical collar.  I fell again on right hip.   Had cortisone shot this morning in hip.   ?PAIN:  ?Are you having pain? Yes midback with movement 3/10 ? ? ?PATIENT GOALS: wants to continue working; spend time with family friends ? ? ?OBJECTIVE:  ?  ?DIAGNOSTIC FINDINGS:  ?Arthritis in neck;no fracture per Dr. Tonita Cong ?  ?PATIENT SURVEYS:  ?FOTO 38% on 09/11/2021 ?47% on 5/4 ?  ?  ?COGNITION: ?Overall cognitive status: Within functional limits for tasks assessed ?           ?  ?POSTURE:  ?5/4: 12 cm occiput to wall ?3/30: 4 cm occiput to wall ?09/11/2021: At rest head flexed 25 degrees flexion in sitting;  standing against wall at rest occiput 21cm from wall, with effort able to bring occiput 15cm from wall but unable to maintain 5 sec before head drops forward; inc thoracic kyphosis; right scoliosis  ?  ?PALPATION: ?Tender points in bil suboccipitals, upper traps   ?  ?CERVICAL AROM/PROM   able to hold head neutral  ?  ?A/PROM A/PROM (deg) ?09/11/2021 A/ROM (deg)  ?09/18/2021 10/07/21 5/4  ?Flexion       ?Extension 33 ?  38 40 40  ?Right lateral flexion Unable to keep head in neutral for sidebend  15 25  ?Left lateral flexion    12 10  ?Right rotation 40 ?  45 38 50  ?Left rotation 35 40 20 ? 40  ? (Blank rows = not tested) ?  ?UE AROM/PROM: ?  ?A/PROM Right ?09/11/2021 Left ?09/11/2021 Right/left ?4/4 5/4  ?Shoulder flexion  120 in sitting; 150 in supine 120 in sitting; 150 in supine 128 bil 125 in sitting  ?Shoulder extension        ?Shoulder abduction        ?Shoulder adduction        ?Shoulder extension        ?Shoulder internal rotation WFLs WFLs    ?Shoulder external rotation WFLs WFLs    ?Elbow flexion        ?Elbow extension        ?Wrist flexion        ?Wrist extension        ?Wrist ulnar deviation        ?Wrist radial deviation        ?Wrist  pronation        ?Wrist supination        ? (Blank rows = not tested) ?  ?UE MMT:  Cervical extensor strength for short term manual resistance is fair to good but she is unable to maintain pressure for durations > 15-20 sec  ?  ?MMT Right ?09/11/2021 5/4 Left ?09/11/2021 5/4  ?Shoulder flexion '4 4 4 4  '$ ?Shoulder extension '4 4 4 4  '$ ?Shoulder abduction '4 4 4 4  '$ ?Shoulder adduction        ?Shoulder extension        ?Shoulder internal rotation        ?Shoulder external rotation        ?Middle trapezius        ?Lower trapezius        ?Elbow flexion        ?Elbow extension        ?Wrist flexion        ?Wrist extension        ?Wrist ulnar deviation        ?Wrist radial deviation        ?Wrist pronation        ?Wrist supination        ?Grip strength        ? (Blank rows = not tested) ?  ?CERVICAL SPECIAL TESTS:  ?Distraction test: Positive ?  ?  ?PATIENT SURVEYS:  ?FOTO 38% on eval ?11/06/21: 47% ?  ?TODAY'S TREATMENT:  ?5/9 ?Performed on incline wedge with folded pillow and then 1 flat pillow: ? ?Supine: shoulder press down 10x  elbow press down 10x; palms up whole arm press down 10x; ?Supine  shoulder press cane  3# weight 15x; cane 3# side to side 10x; cane 3# flexion 10x  ?Supine red band:  bil rows and bil shoulder extension  x10 each ; ?Supine head retractions 10x2 each; ?Manual therapy with soft tissue mobilization to cervical paraspinals and upper traps with suboccipital release for tissue elongation ? ? ? ?5/4:    ?Supine with one flat pillow:  red band rows 10x  ?Supine 2 flat pillows: shoulder  press down 5x  elbow press down 5x; palms up whole arm press down 5x; ?Supine one and a half flat pillow shoulder press cane  3# weight 15x ?Supine red band one and a half flat pillow:  bil rows and bil sho

## 2021-11-12 ENCOUNTER — Telehealth: Payer: Self-pay | Admitting: *Deleted

## 2021-11-12 NOTE — Chronic Care Management (AMB) (Signed)
?  Chronic Care Management ?Note ? ?11/12/2021 ?Name: CZARINA GINGRAS MRN: [id number] DOB: 08/22/1929 ? ?MARISSA WEAVER is a 88 y.o. year old female who is a primary care patient of Burchette, Alinda Sierras, MD and is actively engaged with the care management team. I reached out to [name] by phone today to assist with scheduling an initial visit with the Licensed Clinical Social Worker ? ?Follow up plan: ?Telephone appointment with care management team member scheduled for:11/13/21 ? ?Laverda Sorenson  ?Care Guide, Embedded Care Coordination ?[place]  Care Management  ?Direct Dial: [phone] ? ?

## 2021-11-13 ENCOUNTER — Ambulatory Visit: Payer: Medicare Other | Admitting: Physical Therapy

## 2021-11-13 ENCOUNTER — Ambulatory Visit (INDEPENDENT_AMBULATORY_CARE_PROVIDER_SITE_OTHER): Payer: Medicare Other | Admitting: Licensed Clinical Social Worker

## 2021-11-13 DIAGNOSIS — E1165 Type 2 diabetes mellitus with hyperglycemia: Secondary | ICD-10-CM

## 2021-11-13 DIAGNOSIS — M542 Cervicalgia: Secondary | ICD-10-CM | POA: Diagnosis not present

## 2021-11-13 DIAGNOSIS — R293 Abnormal posture: Secondary | ICD-10-CM

## 2021-11-13 DIAGNOSIS — M5382 Other specified dorsopathies, cervical region: Secondary | ICD-10-CM

## 2021-11-13 DIAGNOSIS — M6281 Muscle weakness (generalized): Secondary | ICD-10-CM

## 2021-11-13 DIAGNOSIS — I4819 Other persistent atrial fibrillation: Secondary | ICD-10-CM

## 2021-11-13 DIAGNOSIS — G8929 Other chronic pain: Secondary | ICD-10-CM

## 2021-11-13 NOTE — Therapy (Signed)
?OUTPATIENT PHYSICAL THERAPY TREATMENT NOTE ? ? ?Patient Name: Julie Norton ?MRN: [id number] ?DOB:[birth year], 88 y.o., female ?Today's Date: 11/13/2021 ? ?PCP: Julie Post, MD ?REFERRING PROVIDER: Susa Day, MD ? ?  ? ? PT End of Session - 11/06/21 1449   ? ? Visit Number 11   ? Date for PT Re-Evaluation 01/01/22   ? Authorization Type UHC Medicare   ? PT Start Time 1448   ? PT Stop Time 1526   ? PT Time Calculation (min) 38 min   ? Activity Tolerance Patient tolerated treatment well   ? ?  ?  ? ?  ? ? ?Past Medical History:  ?Diagnosis Date  ? Abdominal adhesions   ? Abdominal gas pain   ? suspected -- may be related to intermittent right upper quadrant discomfort radiating around her back      ? Anemia   ? Anginal pain ([place])   ? Arthritis   ? Atrial fibrillation ([place])   ? Breast cancer ([place])   ? Breast mass   ? left breast  ? Cancer ([place])   ? left breast  ? Chest pain   ? intermittent right upper quadrant discomfort radiating around her back      ? Complication of anesthesia   ? Diabetes mellitus without complication ([place])   ? Type II  ? Dyspepsia   ? Dyspnea   ? GERD (gastroesophageal reflux disease)   ? H/O: hysterectomy 1978  ? History of cardioversion   ? Hypercholesteremia   ? Hypothyroidism   ? Mitral insufficiency   ? Personal history of radiation therapy   ? Pneumonia   ? PONV (postoperative nausea and vomiting)   ? Right bundle branch block (RBBB)   ? Tendinitis   ? of the right arm and shoulder  ? Torn tendon   ? right shoulder  ? ?Past Surgical History:  ?Procedure Laterality Date  ? ABDOMINAL HYSTERECTOMY    ? APPENDECTOMY  1978  ? BLADDER REPAIR    ? BREAST LUMPECTOMY Left 02/24/2011  ? central partial mastectomy - dr Julie Norton  ? BREAST REDUCTION SURGERY    ? CARDIOVERSION N/A 10/28/2017  ? Procedure: CARDIOVERSION;  Surgeon: Julie Perla, MD;  Location: [facility] ENDOSCOPY;  Service: Cardiovascular;  Laterality: N/A;  ? CARDIOVERSION N/A 04/06/2019  ? Procedure: CARDIOVERSION;  Surgeon:  Julie Dresser, MD;  Location: [place];  Service: Cardiovascular;  Laterality: N/A;  ? CARDIOVERSION N/A 09/08/2019  ? Procedure: CARDIOVERSION;  Surgeon: Julie Spark, MD;  Location: [place];  Service: Cardiovascular;  Laterality: N/A;  ? CARDIOVERSION N/A 08/28/2021  ? Procedure: CARDIOVERSION;  Surgeon: Julie Dresser, MD;  Location: [facility] ENDOSCOPY;  Service: Cardiovascular;  Laterality: N/A;  ? COLONOSCOPY WITH PROPOFOL N/A 03/24/2017  ? Procedure: COLONOSCOPY WITH PROPOFOL;  Surgeon: Julie Corner, MD;  Location: [place];  Service: Endoscopy;  Laterality: N/A;  ? ESOPHAGOGASTRODUODENOSCOPY (EGD) WITH PROPOFOL N/A 03/24/2017  ? Procedure: ESOPHAGOGASTRODUODENOSCOPY (EGD) WITH PROPOFOL;  Surgeon: Julie Corner, MD;  Location: [place];  Service: Endoscopy;  Laterality: N/A;  ? LIPOMA EXCISION Right   ? REDUCTION MAMMAPLASTY Bilateral 1998  ? RETINAL DETACHMENT SURGERY Left   ? SALPINGOOPHORECTOMY    ? TEE WITHOUT CARDIOVERSION N/A 10/28/2017  ? Procedure: TRANSESOPHAGEAL ECHOCARDIOGRAM (TEE);  Surgeon: Julie Perla, MD;  Location: [facility] ENDOSCOPY;  Service: Cardiovascular;  Laterality: N/A;  ? [place]  ? at 88 years of age  ? ?Patient Active Problem List  ? Diagnosis Date Noted  ?  Secondary hypercoagulable state ([place]) 09/05/2021  ? Persistent atrial fibrillation ([place]) 08/26/2021  ? Chronic insomnia 01/21/2021  ? Degeneration of lumbar intervertebral disc 02/03/2019  ? GERD (gastroesophageal reflux disease) 11/30/2017  ? Other persistent atrial fibrillation ([place])   ? Injury of muscle of rotator cuff 10/12/2017  ? Iron deficiency anemia, unspecified 03/24/2017  ? Poorly controlled type 2 diabetes mellitus ([place]) 02/23/2017  ? Mitral insufficiency 02/04/2016  ? Right bundle branch block 05/16/2015  ? Chest pain of unknown etiology 05/16/2015  ? Peripheral neuropathy 08/23/2014  ? Heart murmur previously undiagnosed 01/22/2014  ? Abdominal pain  11/11/2011  ? Ductal carcinoma in situ (DCIS) of left breast 01/12/2011  ? Hypercholesterolemia 11/28/2010  ? Hypothyroidism 11/28/2010  ? Postmenopausal state 11/28/2010  ? Osteoarthritis 11/28/2010  ? Dyspepsia 11/28/2010  ? ? ?REFERRING DIAG: Cervicalgia ? ?THERAPY DIAG:  ?Cervicalgia ? ?Abnormal posture ? ?Muscle weakness (generalized) ? ?PERTINENT HISTORY: AFib, osteopenia; neuropathy; diabetes, OA neck, scoliosis; husband passed away in 03/08/2023 married 33 years; dtr Julie Norton brought her today since she is unable to drive ? ?PRECAUTIONS: Fall ? ?SUBJECTIVE:  Cortisone shot still helping.  Did OK last with last visit.  Arrives with chin to chest and right head lean standing and walking posture.   ?PAIN:  ?Are you having pain? Yes midback with movement 2/10 ? ? ?PATIENT GOALS: wants to continue working; spend time with family friends ? ? ?OBJECTIVE:  ?  ?DIAGNOSTIC FINDINGS:  ?Arthritis in neck;no fracture per Dr. Tonita Norton ?  ?PATIENT SURVEYS:  ?FOTO 38% on 09/11/2021 ?47% on 5/4 ?  ?  ?COGNITION: ?Overall cognitive status: Within functional limits for tasks assessed ?           ?  ?POSTURE:  ?5/4: 12 cm occiput to wall ?3/30: 4 cm occiput to wall ?09/11/2021: At rest head flexed 25 degrees flexion in sitting;  standing against wall at rest occiput 21cm from wall, with effort able to bring occiput 15cm from wall but unable to maintain 5 sec before head drops forward; inc thoracic kyphosis; right scoliosis  ?  ?PALPATION: ?Tender points in bil suboccipitals, upper traps   ?  ?CERVICAL AROM/PROM   able to hold head neutral  ?  ?A/PROM A/PROM (deg) ?09/11/2021 A/ROM (deg)  ?09/18/2021 10/07/21 5/4  ?Flexion       ?Extension 33 ?  38 40 40  ?Right lateral flexion Unable to keep head in neutral for sidebend  15 25  ?Left lateral flexion    12 10  ?Right rotation 40 ?  45 38 50  ?Left rotation 35 40 20 ? 40  ? (Blank rows = not tested) ?  ?UE AROM/PROM: ?  ?A/PROM Right ?09/11/2021 Left ?09/11/2021 Right/left ?4/4 5/4  ?Shoulder flexion 120  in sitting; 150 in supine 120 in sitting; 150 in supine 128 bil 125 in sitting  ?Shoulder extension        ?Shoulder abduction        ?Shoulder adduction        ?Shoulder extension        ?Shoulder internal rotation WFLs WFLs    ?Shoulder external rotation WFLs WFLs    ?Elbow flexion        ?Elbow extension        ?Wrist flexion        ?Wrist extension        ?Wrist ulnar deviation        ?Wrist radial deviation        ?Wrist pronation        ?  Wrist supination        ? (Blank rows = not tested) ?  ?UE MMT:  Cervical extensor strength for short term manual resistance is fair to good but she is unable to maintain pressure for durations > 15-20 sec  ?  ?MMT Right ?09/11/2021 5/4 Left ?09/11/2021 5/4  ?Shoulder flexion '4 4 4 4  '$ ?Shoulder extension '4 4 4 4  '$ ?Shoulder abduction '4 4 4 4  '$ ?Shoulder adduction        ?Shoulder extension        ?Shoulder internal rotation        ?Shoulder external rotation        ?Middle trapezius        ?Lower trapezius        ?Elbow flexion        ?Elbow extension        ?Wrist flexion        ?Wrist extension        ?Wrist ulnar deviation        ?Wrist radial deviation        ?Wrist pronation        ?Wrist supination        ?Grip strength        ? (Blank rows = not tested) ?  ?CERVICAL SPECIAL TESTS:  ?Distraction test: Positive ?  ?  ?PATIENT SURVEYS:  ?FOTO 38% on eval ?11/06/21: 47% ?  ?TODAY'S TREATMENT:  ?5/11: ?Performed on incline wedge with 1 flat pillow: ? ?Supine: shoulder press down 10x  ?Bil arm raises 10x  ?2  1# press toward the ceiling 10x; single arm punch 1# 10x  on each side; bil 1# windshield wipers 10x ?Supine cervical rotation 5x each way ?Supine head retractions 10x2 each; ?Red band rows 10x; red band overhead to shoulder extension 20x ?Manual therapy with soft tissue mobilization to cervical paraspinals. pectorals and upper traps with suboccipital release for tissue elongation; right upper trap focused stretching ? ? ? ? ?5/9 ?Performed on incline wedge with folded pillow  and then 1 flat pillow: ? ?Supine: shoulder press down 10x  elbow press down 10x; palms up whole arm press down 10x; ?Supine  shoulder press cane  3# weight 15x; cane 3# side to side 10x; cane 3# flexion 10x

## 2021-11-17 NOTE — Addendum Note (Signed)
Addended by: Eulas Post on: 11/17/2021 07:52 AM ? ? Modules accepted: Orders ? ?

## 2021-11-17 NOTE — Chronic Care Management (AMB) (Signed)
?Chronic Care Management  ? ? Clinical Social Work Note ? ?11/17/2021 ?Name: Julie Norton MRN: [id number] DOB: 11-23-1929 ? ?Julie Norton is a 88 y.o. year old female who is a primary care patient of Burchette, Alinda Sierras, MD. The CCM team was consulted to assist the patient with chronic disease management and/or care coordination needs related to: Level of Care Concerns.  ? ?Engaged with patient's children by telephone for initial visit in response to provider referral for social work chronic care management and care coordination services.  ? ?Consent to Services:  ?The patient was given the following information about Chronic Care Management services today, agreed to services, and gave verbal consent: 1. CCM service includes personalized support from designated clinical staff supervised by the primary care provider, including individualized plan of care and coordination with other care providers 2. 24/7 contact phone numbers for assistance for urgent and routine care needs. 3. Service will only be billed when office clinical staff spend 20 minutes or more in a month to coordinate care. 4. Only one practitioner may furnish and bill the service in a calendar month. 5.The patient may stop CCM services at any time (effective at the end of the month) by phone call to the office staff. 6. The patient will be responsible for cost sharing (co-pay) of up to 20% of the service fee (after annual deductible is met). Patient agreed to services and consent obtained. ? ?Patient agreed to services and consent obtained.  ? ?Assessment: Review of patient past medical history, allergies, medications, and health status, including review of relevant consultants reports was performed today as part of a comprehensive evaluation and provision of chronic care management and care coordination services.    ? ?SDOH (Social Determinants of Health) assessments and interventions performed:   ? ?Advanced Directives Status: Not addressed in  this encounter. ? ?CCM Care Plan ? ?Allergies  ?Allergen Reactions  ? Bactrim Nausea And Vomiting  ? Dilaudid [Hydromorphone Hcl] Nausea Only  ? Hydrocodone Nausea And Vomiting  ? Invokana [Canagliflozin] Itching and Other (See Comments)  ?  Yeast Infections ?  ? Macrodantin Nausea And Vomiting  ? ? ?Outpatient Encounter Medications as of 11/13/2021  ?Medication Sig  ? acetaminophen (TYLENOL) 500 MG tablet Take 1,000 mg by mouth every 6 (six) hours as needed for mild pain or moderate pain.   ? apixaban (ELIQUIS) 2.5 MG TABS tablet TAKE ONE TABLET BY MOUTH EVERY MORNING and TAKE ONE TABLET BY MOUTH EVERY EVENING  ? Calcium Carb-Cholecalciferol (CALCIUM+D3 PO) Take 1 tablet by mouth daily with breakfast.  ? Cholecalciferol (VITAMIN D3) 125 MCG (5000 UT) CAPS TAKE ONE CAPSULE BY MOUTH EVERY MORNING  ? dofetilide (TIKOSYN) 125 MCG capsule Take 1 capsule (125 mcg total) by mouth 2 (two) times daily.  ? ferrous sulfate 325 (65 FE) MG EC tablet Take 325 mg daily  ? glucose blood (ONE TOUCH ULTRA TEST) test strip Check once daily. E11.40  ? insulin degludec (TRESIBA FLEXTOUCH) 100 UNIT/ML FlexTouch Pen Inject 24 units into THE SKIN ONCE daily AT 10pm (Patient taking differently: 14-18 Units in the morning.)  ? Insulin Pen Needle 29G X 5MM MISC Use once daily  ? levothyroxine (SYNTHROID) 50 MCG tablet TAKE ONE TABLET BY MOUTH BEFORE BREAKFAST  ? lidocaine (LIDODERM) 5 % Place 1 patch onto the skin See admin instructions. Apply 1 patch to the neck every day  ? loratadine (CLARITIN) 10 MG tablet Take 10 mg by mouth daily as needed for allergies.  ?  MAGNESIUM PO Take 1 tablet by mouth daily with breakfast.  ? metoprolol succinate (TOPROL-XL) 25 MG 24 hr tablet Take 0.5 tablets (12.5 mg total) by mouth every morning.  ? omeprazole (PRILOSEC) 20 MG capsule Take 1 capsule (20 mg total) by mouth as needed.  ? ONE TOUCH ULTRA TEST test strip CHECK ONCE A DAY  ? potassium chloride (KLOR-CON) 10 MEQ tablet Take 1 tablet (10 mEq total)  by mouth 2 (two) times daily.  ? torsemide (DEMADEX) 20 MG tablet Take 1 tablet (20 mg total) by mouth daily as needed. Take daily prn weight gain or swelling  ? vitamin B-12 (CYANOCOBALAMIN) 1000 MCG tablet Take 1 tablet (1,000 mcg total) by mouth daily.  ? ?No facility-administered encounter medications on file as of 11/13/2021.  ? ? ?Patient Active Problem List  ? Diagnosis Date Noted  ? Secondary hypercoagulable state ([place]) 09/05/2021  ? Persistent atrial fibrillation ([place]) 08/26/2021  ? Chronic insomnia 01/21/2021  ? Degeneration of lumbar intervertebral disc 02/03/2019  ? GERD (gastroesophageal reflux disease) 11/30/2017  ? Other persistent atrial fibrillation ([place])   ? Injury of muscle of rotator cuff 10/12/2017  ? Iron deficiency anemia, unspecified 03/24/2017  ? Poorly controlled type 2 diabetes mellitus ([place]) 02/23/2017  ? Mitral insufficiency 02/04/2016  ? Right bundle branch block 05/16/2015  ? Chest pain of unknown etiology 05/16/2015  ? Peripheral neuropathy 08/23/2014  ? Heart murmur previously undiagnosed 01/22/2014  ? Abdominal pain 11/11/2011  ? Ductal carcinoma in situ (DCIS) of left breast 01/12/2011  ? Hypercholesterolemia 11/28/2010  ? Hypothyroidism 11/28/2010  ? Postmenopausal state 11/28/2010  ? Osteoarthritis 11/28/2010  ? Dyspepsia 11/28/2010  ? ? ?Conditions to be addressed/monitored: Atrial Fibrillation, DMII, and Osteoarthritis; Level of care concerns, Inability to perform ADL's independently, and Inability to perform IADL's independently ? ?Care Plan : Plan of Care  ?Updates made by Rebekah Chesterfield, LCSW since 11/17/2021 12:00 AM  ?  ? ?Problem: Quality of Life (General Plan of Care)   ?  ? ?Goal: Quality of Life Maintained   ?Start Date: 11/13/2021  ?Expected End Date: 03/05/2022  ?This Visit's Progress: On track  ?Priority: High  ?Note:   ?Current Barriers:  ?Level of Care Concerns:Facility placement (SNF) ? ?CSW Clinical Goal(s):  ?Patient  will verbalize basic understanding of  Osteoporosis disease process and self health management plan    through collaboration with Clinical Social Worker, provider, and care team.  ? ?Interventions: ?All hx provided by patient's adult children, Julie Norton and Julie Norton ?Pt has limited mobility and increased difficulty completing ADLs. She is interested in admission to SNF to assist with rehab ?Dr. Maxie Better with [facility] suggested that PCP assess gait concerns. Pt is actively receiving outpatient rehab; however, there has been no observed changes in pt's condition ?Pt utilizes a walker and has a life alert device, in the case of an emergency ?Pt has a strong support system  ?Inter-disciplinary care team collaboration (see longitudinal plan of care) ?Evaluation of current treatment plan related to  self management and patient's adherence to plan as established by provider ?Review resources, discussed options and provided patient information about  ?Enhanced Benefits connected with insurance provider:([facility]) ?Personal Care Services [facility]) :(Pt does not have MA) ?Facility placement process :Emailed supportive info ?Solution-Focused Strategies employed:  ?Active listening / Reflection utilized  ?Emotional Support Provided ?Caregiver stress acknowledged  ?Consideration of in-home help encouraged : options discussed ?Verbalization of feelings encouraged  ?  ?Task & activities to accomplish goals: ?Review and utilize supportive  information provided ?Contact PCP office with any questions or concerns ?Attend scheduled medical appointments ?Speak with Outpatient Rehab about tx plan and recommendations ? ?  ?  ? ?Follow Up Plan: SW will follow up with patient by phone over the next 4 weeks ?     ?Christa See, MSW, LCSW ?[facility] ?[facility] ?[email or web address] ?Phone [phone] ?4:47 PM  ? ? ?

## 2021-11-17 NOTE — Patient Instructions (Signed)
Visit Information ? ?Thank you for taking time to visit with me today. Please don't hesitate to contact me if I can be of assistance to you before our next scheduled telephone appointment. ? ?Following are the goals we discussed today:  ?Task & activities to accomplish goals: ?Review and utilize supportive information provided ?Contact PCP office with any questions or concerns ?Attend scheduled medical appointments ?Speak with Outpatient Rehab about tx plan and recommendations ? ?Our next appointment is by telephone on 12/04/21 at 9:00 AM ? ?Please call the care guide team at [phone] if you need to cancel or reschedule your appointment.  ? ?If you are experiencing a Mental Health or [facility] or need someone to talk to, please call 911  ? ?Following is a copy of your full plan of care:  ?Care Plan : Plan of Care  ?Updates made by Rebekah Chesterfield, LCSW since 11/17/2021 12:00 AM  ?  ? ?Problem: Quality of Life (General Plan of Care)   ?  ? ?Goal: Quality of Life Maintained   ?Start Date: 11/13/2021  ?Expected End Date: 03/05/2022  ?This Visit's Progress: On track  ?Priority: High  ?Note:   ?Current Barriers:  ?Level of Care Concerns:Facility placement (SNF) ? ?CSW Clinical Goal(s):  ?Patient  will verbalize basic understanding of Osteoporosis disease process and self health management plan    through collaboration with Clinical Social Worker, provider, and care team.  ? ?Interventions: ?All hx provided by patient's adult children, Clarene Critchley and [place] ?Pt has limited mobility and increased difficulty completing ADLs. She is interested in admission to SNF to assist with rehab ?Dr. Maxie Better with [facility] suggested that PCP assess gait concerns. Pt is actively receiving outpatient rehab; however, there has been no observed changes in pt's condition ?Pt utilizes a walker and has a life alert device, in the case of an emergency ?Pt has a strong support system  ?Inter-disciplinary care team collaboration (see  longitudinal plan of care) ?Evaluation of current treatment plan related to  self management and patient's adherence to plan as established by provider ?Review resources, discussed options and provided patient information about  ?Enhanced Benefits connected with insurance provider:([facility]) ?Personal Care Services [facility]) :(Pt does not have MA) ?Facility placement process :Emailed supportive info ?Solution-Focused Strategies employed:  ?Active listening / Reflection utilized  ?Emotional Support Provided ?Caregiver stress acknowledged  ?Consideration of in-home help encouraged : options discussed ?Verbalization of feelings encouraged  ?  ?Task & activities to accomplish goals: ?Review and utilize supportive information provided ?Contact PCP office with any questions or concerns ?Attend scheduled medical appointments ?Speak with Outpatient Rehab about tx plan and recommendations ? ?  ? ? ?Ms. Koch was given information about Care Management services by the embedded care coordination team including:  ?Care Management services include personalized support from designated clinical staff supervised by her physician, including individualized plan of care and coordination with other care providers ?24/7 contact phone numbers for assistance for urgent and routine care needs. ?The patient may stop CCM services at any time (effective at the end of the month) by phone call to the office staff. ? ?Patient agreed to services and verbal consent obtained.  ? ?Patient verbalizes understanding of instructions and care plan provided today and agrees to view in [place]. Active MyChart status confirmed with patient.   ? ?Christa See, MSW, LCSW ?[facility] ?[facility] ?[email or web address] ?Phone [phone] ?4:48 PM  ?  ?

## 2021-11-17 NOTE — Telephone Encounter (Signed)
I agree that she would be a good candidate for in home PT.   I will place referral.   ? ?Eulas Post MD ?[facility] ? ?

## 2021-11-18 ENCOUNTER — Encounter: Payer: Self-pay | Admitting: Cardiology

## 2021-11-18 ENCOUNTER — Ambulatory Visit: Payer: Medicare Other | Admitting: Physical Therapy

## 2021-11-18 ENCOUNTER — Telehealth: Payer: Self-pay | Admitting: Cardiology

## 2021-11-18 DIAGNOSIS — M6281 Muscle weakness (generalized): Secondary | ICD-10-CM

## 2021-11-18 DIAGNOSIS — M542 Cervicalgia: Secondary | ICD-10-CM

## 2021-11-18 DIAGNOSIS — R293 Abnormal posture: Secondary | ICD-10-CM | POA: Diagnosis not present

## 2021-11-18 NOTE — Telephone Encounter (Signed)
Pt c/o medication issue: ? ?1. Name of Medication: dofetilide (TIKOSYN) 125 MCG capsule ? ?2. How are you currently taking this medication (dosage and times per day)? As prescribed  ? ?3. Are you having a reaction (difficulty breathing--STAT)? Yes ? ?4. What is your medication issue?  Pt states that she is losing weigh and she is more tired than tired that usual and also having issues with her voice. Harder for her to speak.  ?

## 2021-11-18 NOTE — Therapy (Signed)
?OUTPATIENT PHYSICAL THERAPY TREATMENT NOTE ? ? ?Patient Name: EMPRESS NEWMANN ?MRN: [id number] ?DOB:18-Jul-1929, 88 y.o., female ?Today's Date: 11/18/2021 ? ?PCP: Eulas Post, MD ?REFERRING PROVIDER: Susa Day, MD ? ?  ? ? PT End of Session - 11/06/21 1449   ? ? Visit Number 11   ? Date for PT Re-Evaluation 01/01/22   ? Authorization Type [employer] Medicare   ? PT Start Time 1448   ? PT Stop Time 1526   ? PT Time Calculation (min) 38 min   ? Activity Tolerance Patient tolerated treatment well   ? ?  ?  ? ?  ? ? ?Past Medical History:  ?Diagnosis Date  ? Abdominal adhesions   ? Abdominal gas pain   ? suspected -- may be related to intermittent right upper quadrant discomfort radiating around her back      ? Anemia   ? Anginal pain ([place])   ? Arthritis   ? Atrial fibrillation ([place])   ? Breast cancer ([place])   ? Breast mass   ? left breast  ? Cancer ([place])   ? left breast  ? Chest pain   ? intermittent right upper quadrant discomfort radiating around her back      ? Complication of anesthesia   ? Diabetes mellitus without complication ([place])   ? Type II  ? Dyspepsia   ? Dyspnea   ? GERD (gastroesophageal reflux disease)   ? H/O: hysterectomy 1978  ? History of cardioversion   ? Hypercholesteremia   ? Hypothyroidism   ? Mitral insufficiency   ? Personal history of radiation therapy   ? Pneumonia   ? PONV (postoperative nausea and vomiting)   ? Right bundle branch block (RBBB)   ? Tendinitis   ? of the right arm and shoulder  ? Torn tendon   ? right shoulder  ? ?Past Surgical History:  ?Procedure Laterality Date  ? ABDOMINAL HYSTERECTOMY    ? APPENDECTOMY  1978  ? BLADDER REPAIR    ? BREAST LUMPECTOMY Left 02/24/2011  ? central partial mastectomy - dr Margot Chimes  ? BREAST REDUCTION SURGERY    ? CARDIOVERSION N/A 10/28/2017  ? Procedure: CARDIOVERSION;  Surgeon: Lelon Perla, MD;  Location: [facility] ENDOSCOPY;  Service: Cardiovascular;  Laterality: N/A;  ? CARDIOVERSION N/A 04/06/2019  ? Procedure: CARDIOVERSION;  Surgeon:  [name], MD;  Location: [place];  Service: Cardiovascular;  Laterality: N/A;  ? CARDIOVERSION N/A 09/08/2019  ? Procedure: CARDIOVERSION;  Surgeon: Dorothy Spark, MD;  Location: [facility];  Service: Cardiovascular;  Laterality: N/A;  ? CARDIOVERSION N/A 08/28/2021  ? Procedure: CARDIOVERSION;  Surgeon: [name], MD;  Location: [facility] ENDOSCOPY;  Service: Cardiovascular;  Laterality: N/A;  ? COLONOSCOPY WITH PROPOFOL N/A 03/24/2017  ? Procedure: COLONOSCOPY WITH PROPOFOL;  Surgeon: Wilford Corner, MD;  Location: [facility];  Service: Endoscopy;  Laterality: N/A;  ? ESOPHAGOGASTRODUODENOSCOPY (EGD) WITH PROPOFOL N/A 03/24/2017  ? Procedure: ESOPHAGOGASTRODUODENOSCOPY (EGD) WITH PROPOFOL;  Surgeon: Wilford Corner, MD;  Location: [facility];  Service: Endoscopy;  Laterality: N/A;  ? LIPOMA EXCISION Right   ? REDUCTION MAMMAPLASTY Bilateral 1998  ? RETINAL DETACHMENT SURGERY Left   ? SALPINGOOPHORECTOMY    ? TEE WITHOUT CARDIOVERSION N/A 10/28/2017  ? Procedure: TRANSESOPHAGEAL ECHOCARDIOGRAM (TEE);  Surgeon: Lelon Perla, MD;  Location: [facility] ENDOSCOPY;  Service: Cardiovascular;  Laterality: N/A;  ? [place]  ? at 88 years of age  ? ?Patient Active Problem List  ? Diagnosis Date Noted  ?  Secondary hypercoagulable state ([place]) 09/05/2021  ? Persistent atrial fibrillation ([place]) 08/26/2021  ? Chronic insomnia 01/21/2021  ? Degeneration of lumbar intervertebral disc 02/03/2019  ? GERD (gastroesophageal reflux disease) 11/30/2017  ? Other persistent atrial fibrillation ([place])   ? Injury of muscle of rotator cuff 10/12/2017  ? Iron deficiency anemia, unspecified 03/24/2017  ? Poorly controlled type 2 diabetes mellitus ([place]) 02/23/2017  ? Mitral insufficiency 02/04/2016  ? Right bundle branch block 05/16/2015  ? Chest pain of unknown etiology 05/16/2015  ? Peripheral neuropathy 08/23/2014  ? Heart murmur previously undiagnosed 01/22/2014  ? Abdominal pain  11/11/2011  ? Ductal carcinoma in situ (DCIS) of left breast 01/12/2011  ? Hypercholesterolemia 11/28/2010  ? Hypothyroidism 11/28/2010  ? Postmenopausal state 11/28/2010  ? Osteoarthritis 11/28/2010  ? Dyspepsia 11/28/2010  ? ? ?REFERRING DIAG: Cervicalgia ? ?THERAPY DIAG:  ?Cervicalgia ? ?Abnormal posture ? ?Muscle weakness (generalized) ? ?PERTINENT HISTORY: AFib, osteopenia; neuropathy; diabetes, OA neck, scoliosis; husband passed away in 2023/02/23 married 92 years; dtr [name] brought her today since she is unable to drive ? ?PRECAUTIONS: Fall ? ?SUBJECTIVE:  Cortisone shot still helping.  Did OK last with last visit.  Arrives with chin to chest and right head lean standing and walking posture.   ?PAIN:  ?Are you having pain? Yes midback with movement 2/10 ? ? ?PATIENT GOALS: wants to continue working; spend time with family friends ? ? ?OBJECTIVE:  ?  ?DIAGNOSTIC FINDINGS:  ?Arthritis in neck;no fracture per Dr. Tonita Cong ?  ?PATIENT SURVEYS:  ?FOTO 38% on 09/11/2021 ?47% on 5/4 ?  ?  ?COGNITION: ?Overall cognitive status: Within functional limits for tasks assessed ?           ?  ?POSTURE:  ?5/4: 12 cm occiput to wall ?3/30: 4 cm occiput to wall ?09/11/2021: At rest head flexed 25 degrees flexion in sitting;  standing against wall at rest occiput 21cm from wall, with effort able to bring occiput 15cm from wall but unable to maintain 5 sec before head drops forward; inc thoracic kyphosis; right scoliosis  ?  ?PALPATION: ?Tender points in bil suboccipitals, upper traps   ?  ?CERVICAL AROM/PROM   able to hold head neutral  ?  ?A/PROM A/PROM (deg) ?09/11/2021 A/ROM (deg)  ?09/18/2021 10/07/21 5/4  ?Flexion       ?Extension 33 ?  38 40 40  ?Right lateral flexion Unable to keep head in neutral for sidebend  15 25  ?Left lateral flexion    12 10  ?Right rotation 40 ?  45 38 50  ?Left rotation 35 40 20 ? 40  ? (Blank rows = not tested) ?  ?UE AROM/PROM: ?  ?A/PROM Right ?09/11/2021 Left ?09/11/2021 Right/left ?4/4 5/4  ?Shoulder flexion 120  in sitting; 150 in supine 120 in sitting; 150 in supine 128 bil 125 in sitting  ?Shoulder extension        ?Shoulder abduction        ?Shoulder adduction        ?Shoulder extension        ?Shoulder internal rotation WFLs WFLs    ?Shoulder external rotation WFLs WFLs    ?Elbow flexion        ?Elbow extension        ?Wrist flexion        ?Wrist extension        ?Wrist ulnar deviation        ?Wrist radial deviation        ?Wrist pronation        ?  Wrist supination        ? (Blank rows = not tested) ?  ?UE MMT:  Cervical extensor strength for short term manual resistance is fair to good but she is unable to maintain pressure for durations > 15-20 sec  ?  ?MMT Right ?09/11/2021 5/4 Left ?09/11/2021 5/4  ?Shoulder flexion '4 4 4 4  '$ ?Shoulder extension '4 4 4 4  '$ ?Shoulder abduction '4 4 4 4  '$ ?Shoulder adduction        ?Shoulder extension        ?Shoulder internal rotation        ?Shoulder external rotation        ?Middle trapezius        ?Lower trapezius        ?Elbow flexion        ?Elbow extension        ?Wrist flexion        ?Wrist extension        ?Wrist ulnar deviation        ?Wrist radial deviation        ?Wrist pronation        ?Wrist supination        ?Grip strength        ? (Blank rows = not tested) ?  ?CERVICAL SPECIAL TESTS:  ?Distraction test: Positive ?  ?  ?PATIENT SURVEYS:  ?FOTO 38% on eval ?11/06/21: 47% ?  ?TODAY'S TREATMENT:  ?5/16: ?Performed on incline wedge with 1 flat pillow: ? ?Yellow band: narrow grip overhead;  wide grip overhead; bil horizontal abduction; diagonals; bil external rotation 10x each  ?2  1# press toward the ceiling 10x;  ?Supine cervical rotation 5x each way ?Supine head retractions 10x2 each; ?green band rows 10x;  ?green band overhead shoulder extension 10x ?1# dumbells elbow flexion 20x  ?Manual therapy with soft tissue mobilization to cervical paraspinals. pectorals and upper traps with suboccipital release for tissue elongation; right upper trap focused  stretching ? ? ? ? ? ?5/11: ?Performed on incline wedge with 1 flat pillow: ? ?Supine: shoulder press down 10x  ?Bil arm raises 10x  ?2  1# press toward the ceiling 10x; single arm punch 1# 10x  on each side; bil 1# windshield wipers 10x ?S

## 2021-11-19 NOTE — Telephone Encounter (Signed)
Patient has decided to stop Tikosyn as of this morning. Patient feels majority of her symptoms related to weakness, weight loss, voice hoarseness are related to Tikosyn.  Patient educated on importance of NOT restarting tikosyn in the outpatient setting - pt will discard any leftover tikosyn at this time. Pt will keep scheduled follow ups. ?

## 2021-11-20 ENCOUNTER — Ambulatory Visit: Payer: Medicare Other | Admitting: Physical Therapy

## 2021-11-20 DIAGNOSIS — M542 Cervicalgia: Secondary | ICD-10-CM

## 2021-11-20 DIAGNOSIS — R293 Abnormal posture: Secondary | ICD-10-CM | POA: Diagnosis not present

## 2021-11-20 DIAGNOSIS — M6281 Muscle weakness (generalized): Secondary | ICD-10-CM

## 2021-11-20 NOTE — Therapy (Addendum)
OUTPATIENT PHYSICAL THERAPY TREATMENT NOTE/  DISCHARGE SUMMARY   Patient Name: Julie Norton MRN: [id number] DOB:Sep 26, 1929, 88 y.o., female Today's Date: 11/20/2021  PCP: Eulas Post, MD REFERRING PROVIDER: Susa Day, MD      PT End of Session - 11/06/21 1449     Visit Number 15   Date for PT Re-Evaluation 01/01/22    Authorization Type U[employer] Medicare    PT Start Time 1016   PT Stop Time 1058   PT Time Calculation (min) 38 min    Activity Tolerance Patient tolerated treatment well             Past Medical History:  Diagnosis Date   Abdominal adhesions    Abdominal gas pain    suspected -- may be related to intermittent right upper quadrant discomfort radiating around her back       Anemia    Anginal pain (HCC)    Arthritis    Atrial fibrillation ([place])    Breast cancer (HCC)    Breast mass    left breast   Cancer ([place])    left breast   Chest pain    intermittent right upper quadrant discomfort radiating around her back       Complication of anesthesia    Diabetes mellitus without complication (HCC)    Type II   Dyspepsia    Dyspnea    GERD (gastroesophageal reflux disease)    H/O: hysterectomy 1978   History of cardioversion    Hypercholesteremia    Hypothyroidism    Mitral insufficiency    Personal history of radiation therapy    Pneumonia    PONV (postoperative nausea and vomiting)    Right bundle branch block (RBBB)    Tendinitis    of the right arm and shoulder   Torn tendon    right shoulder   Past Surgical History:  Procedure Laterality Date   ABDOMINAL HYSTERECTOMY     APPENDECTOMY  1978   BLADDER REPAIR     BREAST LUMPECTOMY Left 02/24/2011   central partial mastectomy - dr Margot Chimes   BREAST REDUCTION SURGERY     CARDIOVERSION N/A 10/28/2017   Procedure: CARDIOVERSION;  Surgeon: Lelon Perla, MD;  Location: [place];  Service: Cardiovascular;  Laterality: N/A;   CARDIOVERSION N/A 04/06/2019   Procedure: CARDIOVERSION;   Surgeon: [name], MD;  Location: [place];  Service: Cardiovascular;  Laterality: N/A;   CARDIOVERSION N/A 09/08/2019   Procedure: CARDIOVERSION;  Surgeon: Dorothy Spark, MD;  Location: [facility] ENDOSCOPY;  Service: Cardiovascular;  Laterality: N/A;   CARDIOVERSION N/A 08/28/2021   Procedure: CARDIOVERSION;  Surgeon: [name], MD;  Location: [place];  Service: Cardiovascular;  Laterality: N/A;   COLONOSCOPY WITH PROPOFOL N/A 03/24/2017   Procedure: COLONOSCOPY WITH PROPOFOL;  Surgeon: Wilford Corner, MD;  Location: [facility] ENDOSCOPY;  Service: Endoscopy;  Laterality: N/A;   ESOPHAGOGASTRODUODENOSCOPY (EGD) WITH PROPOFOL N/A 03/24/2017   Procedure: ESOPHAGOGASTRODUODENOSCOPY (EGD) WITH PROPOFOL;  Surgeon: Wilford Corner, MD;  Location: [place];  Service: Endoscopy;  Laterality: N/A;   LIPOMA EXCISION Right    REDUCTION MAMMAPLASTY Bilateral 1998   RETINAL DETACHMENT SURGERY Left    SALPINGOOPHORECTOMY     TEE WITHOUT CARDIOVERSION N/A 10/28/2017   Procedure: TRANSESOPHAGEAL ECHOCARDIOGRAM (TEE);  Surgeon: Lelon Perla, MD;  Location: [facility] ENDOSCOPY;  Service: Cardiovascular;  Laterality: N/A;   Comunas   at 88 years of age   Patient Active Problem List   Diagnosis Date Noted  Secondary hypercoagulable state ([place]) 09/05/2021   Persistent atrial fibrillation ([place]) 08/26/2021   Chronic insomnia 01/21/2021   Degeneration of lumbar intervertebral disc 02/03/2019   GERD (gastroesophageal reflux disease) 11/30/2017   Other persistent atrial fibrillation ([place])    Injury of muscle of rotator cuff 10/12/2017   Iron deficiency anemia, unspecified 03/24/2017   Poorly controlled type 2 diabetes mellitus ([place]) 02/23/2017   Mitral insufficiency 02/04/2016   Right bundle branch block 05/16/2015   Chest pain of unknown etiology 05/16/2015   Peripheral neuropathy 08/23/2014   Heart murmur previously undiagnosed 01/22/2014    Abdominal pain 11/11/2011   Ductal carcinoma in situ (DCIS) of left breast 01/12/2011   Hypercholesterolemia 11/28/2010   Hypothyroidism 11/28/2010   Postmenopausal state 11/28/2010   Osteoarthritis 11/28/2010   Dyspepsia 11/28/2010    REFERRING DIAG: Cervicalgia  THERAPY DIAG:  Cervicalgia  Abnormal posture  Muscle weakness (generalized)  PERTINENT HISTORY: AFib, osteopenia; neuropathy; diabetes, OA neck, scoliosis; husband passed away in 20-Feb-2023 married 78 years; dtr [name] brought her today since she is unable to drive  PRECAUTIONS: Fall  SUBJECTIVE:  I'm doing a little better.  I went off that medication.  I'm trying to eat a little more but I've lost another pound. PAIN:  Are you having pain? Yes midback with movement 2/10   PATIENT GOALS: wants to continue working; spend time with family friends   OBJECTIVE:    DIAGNOSTIC FINDINGS:  Arthritis in neck;no fracture per Dr. Tonita Cong   PATIENT SURVEYS:  FOTO 38% on 09/11/2021 47% on 5/4     COGNITION: Overall cognitive status: Within functional limits for tasks assessed              POSTURE:  5/4: 12 cm occiput to wall 3/30: 4 cm occiput to wall 09/11/2021: At rest head flexed 25 degrees flexion in sitting;  standing against wall at rest occiput 21cm from wall, with effort able to bring occiput 15cm from wall but unable to maintain 5 sec before head drops forward; inc thoracic kyphosis; right scoliosis    PALPATION: Tender points in bil suboccipitals, upper traps     CERVICAL AROM/PROM   able to hold head neutral    A/PROM A/PROM (deg) 09/11/2021 A/ROM (deg)  09/18/2021 10/07/21 5/4  Flexion       Extension 33   38 40 40  Right lateral flexion Unable to keep head in neutral for sidebend  15 25  Left lateral flexion    12 10  Right rotation 40   45 38 50  Left rotation 35 40 20  40   (Blank rows = not tested)   UE AROM/PROM:   A/PROM Right 09/11/2021 Left 09/11/2021 Right/left 4/4 5/4  Shoulder flexion 120 in  sitting; 150 in supine 120 in sitting; 150 in supine 128 bil 125 in sitting  Shoulder extension        Shoulder abduction        Shoulder adduction        Shoulder extension        Shoulder internal rotation WFLs WFLs    Shoulder external rotation WFLs WFLs    Elbow flexion        Elbow extension        Wrist flexion        Wrist extension        Wrist ulnar deviation        Wrist radial deviation        Wrist pronation  Wrist supination         (Blank rows = not tested)   UE MMT:  Cervical extensor strength for short term manual resistance is fair to good but she is unable to maintain pressure for durations > 15-20 sec    MMT Right 09/11/2021 5/4 Left 09/11/2021 5/4  Shoulder flexion 4 4 4 4   Shoulder extension 4 4 4 4   Shoulder abduction 4 4 4 4   Shoulder adduction        Shoulder extension        Shoulder internal rotation        Shoulder external rotation        Middle trapezius        Lower trapezius        Elbow flexion        Elbow extension        Wrist flexion        Wrist extension        Wrist ulnar deviation        Wrist radial deviation        Wrist pronation        Wrist supination        Grip strength         (Blank rows = not tested)   CERVICAL SPECIAL TESTS:  Distraction test: Positive     PATIENT SURVEYS:  FOTO 38% on eval 11/06/21: 47%   TODAY'S TREATMENT:  5/18 Performed on incline wedge with 1 flat pillow:  Yellow band: narrow grip overhead; bil horizontal abduction; diagonals; bil external rotation 10x each    1# press toward the ceiling 10x each side  1# circles at 90 degrees elevation 10x each side  Supine cervical rotation 5x each way Supine head retractions 10x2 each; green band rows 10x;  green band overhead shoulder extension 10x 1# dumbells elbow flexion 20x  Seated green band rows 10x Manual therapy with soft tissue mobilization to cervical paraspinals. pectorals and upper traps with suboccipital release for tissue  elongation; right upper trap focused stretching    5/16: Performed on incline wedge with 1 flat pillow:  Yellow band: narrow grip overhead;  wide grip overhead; bil horizontal abduction; diagonals; bil external rotation 10x each  2  1# press toward the ceiling 10x;  Supine cervical rotation 5x each way Supine head retractions 10x2 each; green band rows 10x;  green band overhead shoulder extension 10x 1# dumbells elbow flexion 20x  Manual therapy with soft tissue mobilization to cervical paraspinals. pectorals and upper traps with suboccipital release for tissue elongation; right upper trap focused stretching      5/11: Performed on incline wedge with 1 flat pillow:  Supine: shoulder press down 10x  Bil arm raises 10x  2  1# press toward the ceiling 10x; single arm punch 1# 10x  on each side; bil 1# windshield wipers 10x Supine cervical rotation 5x each way Supine head retractions 10x2 each; Red band rows 10x; red band overhead to shoulder extension 20x Manual therapy with soft tissue mobilization to cervical paraspinals. pectorals and upper traps with suboccipital release for tissue elongation; right upper trap focused stretching     5/9 Performed on incline wedge with folded pillow and then 1 flat pillow:  Supine: shoulder press down 10x  elbow press down 10x; palms up whole arm press down 10x; Supine  shoulder press cane  3# weight 15x; cane 3# side to side 10x; cane 3# flexion 10x  Supine red band:  bil rows  and bil shoulder extension  x10 each ; Supine head retractions 10x2 each; Manual therapy with soft tissue mobilization to cervical paraspinals and upper traps with suboccipital release for tissue elongation    5/4:    Supine with one flat pillow:  red band rows 10x  Supine 2 flat pillows: shoulder press down 5x  elbow press down 5x; palms up whole arm press down 5x; Supine one and a half flat pillow shoulder press cane  3# weight 15x Supine red band one and a  half flat pillow:  bil rows and bil shoulder extension  x10 each ; Supine head retractions against 2 flat pillows and then single pillow 10x each; Manual therapy with soft tissue mobilization to cervical paraspinals and upper traps with suboccipital release for tissue elongation  See objective findings above     5/2: Supine with one flat pillow:  red band rows 10x  Supine 2 flat pillows: shoulder press down 5x  elbow press down 5x; palms up whole arm press down 5x; Supine one and a half flat pillow shoulder press cane  3# weight: cane  shoulder flexion 3#;  horizontal abduction cane 3# 10-15x each Supine red band one and a half flat pillow:  bil rows and bil shoulder extension  x10 each ; Supine bil horizontal abduction 2 flat pillows: yellow band 10x; yellow band external rotation 10x bil; yellow band overhead elevation 10x Supine head retractions against 2 flat pillows and then single pillow 10x each; Manual therapy with soft tissue mobilization to cervical paraspinals and upper traps with suboccipital release for tissue elongation    4/27: Supine with one flat pillow:  red band rows 10x  Supine folded pillow elbow press down 10x; palms up whole arm press down 10x; Supine one flat pillow shoulder press cane  2# weight: cane  shoulder flexion 2#;  rotations cane 2# Supine red band one flat pillow: bil shoulder extension  x10; Supine bil horizontal abduction  yellow band 10x; yellow band external rotation 10x bil; yellow band overhead elevation 10x Supine head retractions against folded pillow and then single unfolded pillow 10x each; Manual therapy with soft tissue mobilization to cervical paraspinals and upper traps with suboccipital release for tissue elongation   4/25: Hold on Nu-step (pt feels it bothers her legs at night) Supine with one pillow:  yellow band rows 10x  Supine shoulder press cane  2# weight: cane  bench press; shoulder flexion;  rotations cane 10x each Supine  yellow band one folded pillow: bil shoulder extension x10; bil horizontal abduction ; yellow band external rotation 10x bil  Supine head retractions against folded pillow and then single unfolded pillow 10x each; Manual therapy with soft tissue mobilization to cervical paraspinals and upper traps with suboccipital release for tissue elongation;    10/07/21 Hold on Nu-step (pt feels it bothers her legs at night) Supine with one folded pillow:  cervical retraction, shoulder press, elbow press cane with 2# weight: shoulder flexion, chest press; horizontal abduction 10x each Supine yellow band one folded pillow: bil shoulder extension x10; diagonal extension 10x each side ; yellow band external rotation 10x bil  Manual therapy with soft tissue mobilization to cervical paraspinals and upper traps with suboccipital release for tissue elongation; therapist manually resisting cervical isometrics rotation and retraction 5 sec holds Seated shoulder rows with yellow tband x10 Walker push outs x10; push down on walker 5x 5 sec holds Standing UE elevation 2x 5 on each side   PATIENT EDUCATION:  Education details: supine ex's Person educated: Patient Education method: Explanation, Demonstration, and Handouts Education comprehension: verbalized understanding and returned demonstration     HOME EXERCISE PROGRAM: [email or web address] Access Code: [email or web address] URL: [email or web address] Date: 10/07/2021 Prepared by: Ruben Im  Exercises - Supine Shoulder Flexion Extension AAROM with Dowel  - 2 x daily - 7 x weekly - 1 sets - 10 reps - Supine Shoulder Press  - 2 x daily - 7 x weekly - 1 sets - 10 reps - Supine Cervical Retraction with Towel  - 2 x daily - 7 x weekly - 1 sets - 5 reps - Seated 3 Way Exercise Ball Roll Out Stretch  - 1 x daily - 7 x weekly - 1 sets - 10 reps - Seated Transversus Abdominis Bracing with PLB  - 1 x daily - 7 x weekly - 1 sets - 10 reps    ASSESSMENT:   CLINICAL  IMPRESSION:  Slightly less head pull to the right during session.  Exercise endurance improving overall but still moderately fatigued at the end of session.  Head more neutral immediately after session.    OBJECTIVE IMPAIRMENTS pain, decreased functional activity, decreased ROM and decreased strength.    ACTIVITY LIMITATIONS cleaning, community activity, meal prep, and laundry.    PERSONAL FACTORS Age and Time since onset of injury/illness/exacerbation are also affecting patient's functional outcome.      REHAB POTENTIAL: Good   CLINICAL DECISION MAKING: Stable/uncomplicated   EVALUATION COMPLEXITY: Low     GOALS: Goals reviewed with patient? Yes   SHORT TERM GOALS:   The patient will demonstrate compliance with initial HEP and basic strategies to improve postural alignment   Target date: 10/09/2021 Goal status: met   2.  The patient will have improved cervical/thoracic and UE mobility to brush the back of her head  Baseline:  Target date: 10/09/2021 Goal status: met   3.  The patient will have improved cervical extension to 40 degrees needed to look forward while walking    Target date: 10/09/2021 Goal status: met   4.  Standing wall to occiput distance with effort improved to 11cm    Target date: 10/09/2021 Goal status: met     LONG TERM GOALS:   The patient will be independent with home ex program and postural strategies   Target date: 01/01/22 Goal status: INITIAL   2.  The patient's occiput to wall distance with be 8cm or less   Target date: 6/29 Goal status: met    3.  Cervical extension ROM improved to 50 degrees needed for reaching into the microwave and top kitchen cabinets   Target date: 6/29 Goal status: INITIAL   4.  FOTO score improved to 55% indicating improved function with less pain Baseline: 38% Target date: 6/29 Goal status: INITIAL     PLAN: PT FREQUENCY: 2x/week   PT DURATION: 8 weeks   PLANNED INTERVENTIONS: Therapeutic exercises,  Therapeutic activity, Neuromuscular re-education, Balance training, Gait training, Patient/Family education, Joint mobilization, Aquatic Therapy, Dry Needling, Moist heat, Traction, Ultrasound, and Manual therapy   PLAN FOR NEXT SESSION:       supine ex's on wedge incline and some seated ex ; cervical distraction for pain relief, thoracic extension (supine vs seated); cervical isometrics; manual techniques  particularly right sided musculature   Ruben Im, PT 11/20/21 11:46 AM Phone: [phone] Fax: [phone]   PHYSICAL THERAPY DISCHARGE SUMMARY  Visits from Start of Care: 15  Current functional level related to goals / functional outcomes: The  patient/caregiver called to request discharge from outpatient PT to receive home health PT. The patient has decreased mobility and requires extensive assistance to get to the facility therefore HHPT would be a better option.    Remaining deficits: As above   Education / Equipment: HEP   Patient agrees to discharge. Patient goals were partially met. Patient is being discharged due to the patient's request.   Ruben Im, PT 12/04/21 3:49 PM Phone: [phone] Fax: [phone]

## 2021-11-24 DIAGNOSIS — M5136 Other intervertebral disc degeneration, lumbar region: Secondary | ICD-10-CM | POA: Diagnosis not present

## 2021-11-24 DIAGNOSIS — I4510 Unspecified right bundle-branch block: Secondary | ICD-10-CM | POA: Diagnosis not present

## 2021-11-24 DIAGNOSIS — E7800 Pure hypercholesterolemia, unspecified: Secondary | ICD-10-CM | POA: Diagnosis not present

## 2021-11-24 DIAGNOSIS — E039 Hypothyroidism, unspecified: Secondary | ICD-10-CM | POA: Diagnosis not present

## 2021-11-24 DIAGNOSIS — R627 Adult failure to thrive: Secondary | ICD-10-CM | POA: Diagnosis not present

## 2021-11-24 DIAGNOSIS — I340 Nonrheumatic mitral (valve) insufficiency: Secondary | ICD-10-CM | POA: Diagnosis not present

## 2021-11-24 DIAGNOSIS — K219 Gastro-esophageal reflux disease without esophagitis: Secondary | ICD-10-CM | POA: Diagnosis not present

## 2021-11-24 DIAGNOSIS — Z794 Long term (current) use of insulin: Secondary | ICD-10-CM | POA: Diagnosis not present

## 2021-11-24 DIAGNOSIS — D6869 Other thrombophilia: Secondary | ICD-10-CM | POA: Diagnosis not present

## 2021-11-24 DIAGNOSIS — I4819 Other persistent atrial fibrillation: Secondary | ICD-10-CM | POA: Diagnosis not present

## 2021-11-24 DIAGNOSIS — Z9181 History of falling: Secondary | ICD-10-CM | POA: Diagnosis not present

## 2021-11-24 DIAGNOSIS — Z7901 Long term (current) use of anticoagulants: Secondary | ICD-10-CM | POA: Diagnosis not present

## 2021-11-24 DIAGNOSIS — Z853 Personal history of malignant neoplasm of breast: Secondary | ICD-10-CM | POA: Diagnosis not present

## 2021-11-24 DIAGNOSIS — Z9049 Acquired absence of other specified parts of digestive tract: Secondary | ICD-10-CM | POA: Diagnosis not present

## 2021-11-24 DIAGNOSIS — D509 Iron deficiency anemia, unspecified: Secondary | ICD-10-CM | POA: Diagnosis not present

## 2021-11-24 DIAGNOSIS — E1142 Type 2 diabetes mellitus with diabetic polyneuropathy: Secondary | ICD-10-CM | POA: Diagnosis not present

## 2021-11-24 DIAGNOSIS — M1990 Unspecified osteoarthritis, unspecified site: Secondary | ICD-10-CM | POA: Diagnosis not present

## 2021-11-26 DIAGNOSIS — H26492 Other secondary cataract, left eye: Secondary | ICD-10-CM | POA: Diagnosis not present

## 2021-11-26 DIAGNOSIS — Z9841 Cataract extraction status, right eye: Secondary | ICD-10-CM | POA: Diagnosis not present

## 2021-11-26 DIAGNOSIS — Z961 Presence of intraocular lens: Secondary | ICD-10-CM | POA: Diagnosis not present

## 2021-11-27 ENCOUNTER — Telehealth: Payer: Self-pay | Admitting: Family Medicine

## 2021-11-27 ENCOUNTER — Telehealth: Payer: Self-pay | Admitting: Pharmacist

## 2021-11-27 NOTE — Chronic Care Management (AMB) (Signed)
Call to patient in regard to trazodone refill request from [facility]. Per Julie Norton Patient's PCP will not be in office until Tue so she will not be able to have this added to her delivery on tomorrow as he has to approve the medication. She asked if we could wait until he returns for this as well as her expected delivery of magnesium and [place]. Pharmacy advised to change delivery date to 12/02/21     [place] Clinical Pharmacist Assistant [phone]

## 2021-11-27 NOTE — Telephone Encounter (Signed)
Pt call and stated she need traZODone (DESYREL) 50 MG tablet  sent to  [place], [place] - [address] Phone:  [phone]  Fax:  [phone]

## 2021-11-28 ENCOUNTER — Encounter: Payer: Medicare Other | Admitting: Physical Therapy

## 2021-11-28 MED ORDER — TRAZODONE HCL 50 MG PO TABS: ORAL_TABLET | ORAL | 0 refills | Status: DC

## 2021-11-28 NOTE — Telephone Encounter (Signed)
Call in Trazodone 50 mg to take 1/2 to one tab at bedtime, #30 with no rf

## 2021-11-28 NOTE — Telephone Encounter (Signed)
Rx done. 

## 2021-11-29 DIAGNOSIS — E1142 Type 2 diabetes mellitus with diabetic polyneuropathy: Secondary | ICD-10-CM | POA: Diagnosis not present

## 2021-11-29 DIAGNOSIS — M5136 Other intervertebral disc degeneration, lumbar region: Secondary | ICD-10-CM | POA: Diagnosis not present

## 2021-11-29 DIAGNOSIS — K219 Gastro-esophageal reflux disease without esophagitis: Secondary | ICD-10-CM | POA: Diagnosis not present

## 2021-11-29 DIAGNOSIS — E039 Hypothyroidism, unspecified: Secondary | ICD-10-CM | POA: Diagnosis not present

## 2021-11-29 DIAGNOSIS — D6869 Other thrombophilia: Secondary | ICD-10-CM | POA: Diagnosis not present

## 2021-11-29 DIAGNOSIS — Z794 Long term (current) use of insulin: Secondary | ICD-10-CM | POA: Diagnosis not present

## 2021-11-29 DIAGNOSIS — D509 Iron deficiency anemia, unspecified: Secondary | ICD-10-CM | POA: Diagnosis not present

## 2021-11-29 DIAGNOSIS — M1990 Unspecified osteoarthritis, unspecified site: Secondary | ICD-10-CM | POA: Diagnosis not present

## 2021-11-29 DIAGNOSIS — Z853 Personal history of malignant neoplasm of breast: Secondary | ICD-10-CM | POA: Diagnosis not present

## 2021-11-29 DIAGNOSIS — I340 Nonrheumatic mitral (valve) insufficiency: Secondary | ICD-10-CM | POA: Diagnosis not present

## 2021-11-29 DIAGNOSIS — Z9049 Acquired absence of other specified parts of digestive tract: Secondary | ICD-10-CM | POA: Diagnosis not present

## 2021-11-29 DIAGNOSIS — Z9181 History of falling: Secondary | ICD-10-CM | POA: Diagnosis not present

## 2021-11-29 DIAGNOSIS — E7800 Pure hypercholesterolemia, unspecified: Secondary | ICD-10-CM | POA: Diagnosis not present

## 2021-11-29 DIAGNOSIS — R627 Adult failure to thrive: Secondary | ICD-10-CM | POA: Diagnosis not present

## 2021-11-29 DIAGNOSIS — I4819 Other persistent atrial fibrillation: Secondary | ICD-10-CM | POA: Diagnosis not present

## 2021-11-29 DIAGNOSIS — Z7901 Long term (current) use of anticoagulants: Secondary | ICD-10-CM | POA: Diagnosis not present

## 2021-11-29 DIAGNOSIS — I4510 Unspecified right bundle-branch block: Secondary | ICD-10-CM | POA: Diagnosis not present

## 2021-12-02 ENCOUNTER — Telehealth: Payer: Self-pay

## 2021-12-02 ENCOUNTER — Encounter: Payer: Medicare Other | Admitting: Physical Therapy

## 2021-12-02 NOTE — Telephone Encounter (Signed)
---  Caller states she has a painful rash on her back, seems to be shingles. There are 2 of them but a 3rd one that is starting, began yesterday, they do look like blisters. States it is painful to touch.  12/01/2021 10:47:24 AM See HCP within 4 Hours (or PCP triage) Linward Headland, RN, [place]  Referrals GO TO [facility]  12/02/21 1452 - Pt states she has shingles; on back that is painful. Pt states she never went to [facility]. States she had a neighbor who is a Marine scientist that recognized that it's shingles. Pt states she's taking it Valtrex x 7 days TID. Pt denies other symptoms. Would like to know if there is anything else she needs to be doing. Will send to PCP for triage.

## 2021-12-03 ENCOUNTER — Telehealth: Payer: Self-pay | Admitting: Pharmacist

## 2021-12-03 DIAGNOSIS — I4891 Unspecified atrial fibrillation: Secondary | ICD-10-CM

## 2021-12-03 DIAGNOSIS — M8580 Other specified disorders of bone density and structure, unspecified site: Secondary | ICD-10-CM

## 2021-12-03 DIAGNOSIS — Z794 Long term (current) use of insulin: Secondary | ICD-10-CM

## 2021-12-03 DIAGNOSIS — E118 Type 2 diabetes mellitus with unspecified complications: Secondary | ICD-10-CM

## 2021-12-03 NOTE — Chronic Care Management (AMB) (Unsigned)
Chronic Care Management Pharmacy Assistant   Name: YURY SCHAUS  MRN: [id number] DOB: 11/03/1929  Reason for Encounter: Medication Review Medication Coordination   Recent office visits:  12/04/21 Dorothyann Peng, NP - Patient presented for Disseminated herpes zoster. Prescribed Valacyclovir 1000 mg 3 times a day.  11/13/21 Rebekah Chesterfield, LCSW - Patient presented for CCM Social Work visit. No medication changes.  11/10/21 Burchette, Alinda Sierras, MD - Patient presented for Pelvic joint pain and other concerns. No medication changes.  Recent consult visits:  11/20/21 Alvera Singh, PT - Patient presented for Cervicalgia and other concerns. No medication changes.  11/18/21 Alvera Singh, PT - Patient presented for Cervicalgia and other concerns. No medication changes.  11/13/21 Alvera Singh, PT - Patient presented for Cervicalgia and other concerns. No medication changes.  11/06/21 Alvera Singh, PT - Patient presented for Cervicalgia and other concerns. No medication changes.  Hospital visits:  Medication Reconciliation was completed by comparing discharge summary, patient's EMR and Pharmacy list, and upon discussion with patient.   Patient presented to [facility] on 08/26/21 due to Persistent Atrial Fibrillation. Patient was present for 3 days.   New?Medications Started at [facility] Discharge:?? -started  dofetilide [name])   Medication Changes at Hospital Discharge: -Changed  none   Medications Discontinued at Hospital Discharge: -Stopped  zolpidem 5 MG tablet   Medications that remain the same after Hospital Discharge:??  -All other medications will remain the same.  Medications: Outpatient Encounter Medications as of 12/03/2021  Medication Sig   acetaminophen (TYLENOL) 500 MG tablet Take 1,000 mg by mouth every 6 (six) hours as needed for mild pain or moderate pain.    apixaban (ELIQUIS) 2.5 MG TABS tablet TAKE ONE TABLET BY MOUTH EVERY  MORNING and TAKE ONE TABLET BY MOUTH EVERY EVENING   Calcium Carb-Cholecalciferol (CALCIUM+D3 PO) Take 1 tablet by mouth daily with breakfast.   Cholecalciferol (VITAMIN D3) 125 MCG (5000 UT) CAPS TAKE ONE CAPSULE BY MOUTH EVERY MORNING   ferrous sulfate 325 (65 FE) MG EC tablet Take 325 mg daily   glucose blood (ONE TOUCH ULTRA TEST) test strip Check once daily. E11.40   insulin degludec (TRESIBA FLEXTOUCH) 100 UNIT/ML FlexTouch Pen Inject 24 units into THE SKIN ONCE daily AT 10pm (Patient taking differently: 14-18 Units in the morning.)   Insulin Pen Needle 29G X 5MM MISC Use once daily   levothyroxine (SYNTHROID) 50 MCG tablet TAKE ONE TABLET BY MOUTH BEFORE BREAKFAST   lidocaine (LIDODERM) 5 % Place 1 patch onto the skin See admin instructions. Apply 1 patch to the neck every day   loratadine (CLARITIN) 10 MG tablet Take 10 mg by mouth daily as needed for allergies.   MAGNESIUM PO Take 1 tablet by mouth daily with breakfast.   metoprolol succinate (TOPROL-XL) 25 MG 24 hr tablet Take 0.5 tablets (12.5 mg total) by mouth every morning.   omeprazole (PRILOSEC) 20 MG capsule Take 1 capsule (20 mg total) by mouth as needed.   ONE TOUCH ULTRA TEST test strip CHECK ONCE A DAY   potassium chloride (KLOR-CON) 10 MEQ tablet Take 1 tablet (10 mEq total) by mouth 2 (two) times daily.   torsemide (DEMADEX) 20 MG tablet Take 1 tablet (20 mg total) by mouth daily as needed. Take daily prn weight gain or swelling   traZODone (DESYREL) 50 MG tablet Take 0.5-1 tablet by mouth at bedtime   vitamin B-12 (CYANOCOBALAMIN) 1000 MCG tablet Take 1 tablet (1,000  mcg total) by mouth daily.   No facility-administered encounter medications on file as of 12/03/2021.  Reviewed chart for medication changes ahead of medication coordination call.  No OVs, Consults, or hospital visits since last care coordination call/Pharmacist visit.   No medication changes indicated .  BP Readings from Last 3 Encounters:  11/10/21  136/70  11/05/21 135/84  10/20/21 134/76    Lab Results  Component Value Date   HGBA1C 7.4 (H) 10/20/2021     Patient obtains medications through Adherence Packaging  30 Days   Last adherence delivery included: Potassium Chloride 10 meq- take 1 tablet at breakfast and one tablet at dinner  Metoprolol ER '25mg'$  - take half tablet daily with breakfast Levothyroxine 65mg - take 1 tablet before breakfast Eliquis '5mg'$  - take 1 tablet with breakfast and 1 tablet with dinner DOFETILIDE  125 MCG CAPS 09/25/2021 90  Patient reports she uses good rx for the above medication does not need transfer.     Patient declined the following medications (meds) due to (reason) TTyler AasFlextouch 100u - Inject 24 units once daily  Torsemide '20mg'$  - take one tablet daily PRN     Patient is due for next adherence delivery on: 12/16/21 . Called patient and reviewed medications and coordinated delivery.  This delivery to include: Eliquis '5mg'$  - take 1 tablet with breakfast and 1 tablet with dinner Levothyroxine 563m - take 1 tablet before breakfast Potassium Chloride 10 meq- take 1 tablet at breakfast and one tablet at dinner  Metoprolol ER '25mg'$  - take half tablet daily with breakfast   Tresiba FlexTouch U-100 insulin 100 unit/mL (3 mL) subcutaneous pen 11/27/2021 25   trazodone 50 mg tablet 11/28/2021 30   TORSEMIDE  20 MG TABS 09/17/2021 30   Valacyclovir ( sent to [facility] on 12/04/21)  Patient declined the following medications Tresiba Flextouch 100u - Inject 24 units once daily ( just sent per pt)  Confirmed delivery date of 12/16/21, advised patient that pharmacy will contact them the morning of delivery.   Care Gaps: CCM- 6/23 AWV- 3/23 BP- 118/60 ( 12/04/21) Lab Results  Component Value Date   HGBA1C 7.4 (H) 10/20/2021    Star Rating Drugs: None  LaNed ClinesMHeimdallinical Pharmacist Assistant [phone]

## 2021-12-03 NOTE — Telephone Encounter (Signed)
I spoke with the Julie Norton and she stated the pain is becoming worse so Julie Norton has been scheduled with Tommi Rumps for tomorrow due to limited availability with PCP. Julie Norton request Lidocaine spray as she states she has patches but does not want to apply over rash on back.

## 2021-12-04 ENCOUNTER — Telehealth: Payer: Medicare Other

## 2021-12-04 ENCOUNTER — Encounter: Payer: Self-pay | Admitting: Adult Health

## 2021-12-04 ENCOUNTER — Encounter: Payer: Medicare Other | Admitting: Physical Therapy

## 2021-12-04 ENCOUNTER — Ambulatory Visit (INDEPENDENT_AMBULATORY_CARE_PROVIDER_SITE_OTHER): Payer: Medicare Other | Admitting: Adult Health

## 2021-12-04 VITALS — BP 118/60 | HR 103 | Temp 98.5°F | Ht 61.0 in | Wt 118.0 lb

## 2021-12-04 DIAGNOSIS — B027 Disseminated zoster: Secondary | ICD-10-CM

## 2021-12-04 MED ORDER — VALACYCLOVIR HCL 1 G PO TABS: 1000.0000 mg | ORAL_TABLET | Freq: Three times a day (TID) | ORAL | 0 refills | Status: AC

## 2021-12-04 NOTE — Progress Notes (Signed)
Subjective:    Patient ID: Julie Norton, female    DOB: 1930-01-04, 88 y.o.   MRN: [id number]  HPI 88 year old female who  has a past medical history of Abdominal adhesions, Abdominal gas pain, Anemia, Anginal pain ([place]), Arthritis, Atrial fibrillation ([place]), Breast cancer ([place]), Breast mass, Cancer ([place]), Chest pain, Complication of anesthesia, Diabetes mellitus without complication ([place]), Dyspepsia, Dyspnea, GERD (gastroesophageal reflux disease), H/O: hysterectomy (1978), History of cardioversion, Hypercholesteremia, Hypothyroidism, Mitral insufficiency, Personal history of radiation therapy, Pneumonia, PONV (postoperative nausea and vomiting), Right bundle branch block (RBBB), Tendinitis, and Torn tendon.  She presents to the office today with her caregiver for concern of shingles. She originally started developing a painful blisters on her left flank 3-4 days ago. Since that time the rash has become larger and more painful   Her neighbor brought over acyclovir cream to use and she has bene taking tylenol for pain relief, neither of these seem to be working   Review of Systems See HPI   Past Medical History:  Diagnosis Date   Abdominal adhesions    Abdominal gas pain    suspected -- may be related to intermittent right upper quadrant discomfort radiating around her back       Anemia    Anginal pain (H[facility])    Arthritis    Atrial fibrillation ([place])    Breast cancer ([place])    Breast mass    left breast   Cancer ([place])    left breast   Chest pain    intermittent right upper quadrant discomfort radiating around her back       Complication of anesthesia    Diabetes mellitus without complication ([place])    Type II   Dyspepsia    Dyspnea    GERD (gastroesophageal reflux disease)    H/O: hysterectomy 1978   History of cardioversion    Hypercholesteremia    Hypothyroidism    Mitral insufficiency    Personal history of radiation therapy    Pneumonia    PONV (postoperative nausea  and vomiting)    Right bundle branch block (RBBB)    Tendinitis    of the right arm and shoulder   Torn tendon    right shoulder    Social History   Socioeconomic History   Marital status: Widowed    Spouse name: Not on file   Number of children: Not on file   Years of education: Not on file   Highest education level: Not on file  Occupational History   Not on file  Tobacco Use   Smoking status: Never   Smokeless tobacco: Never  Vaping Use   Vaping Use: Never used  Substance and Sexual Activity   Alcohol use: No   Drug use: No   Sexual activity: Not on file  Other Topics Concern   Not on file  Social History Narrative   Not on file   Social Determinants of Health   Financial Resource Strain: Low Risk    Difficulty of Paying Living Expenses: Not hard at all  Food Insecurity: No Food Insecurity   Worried About Running Out of Food in the Last Year: Never true   [place] in the Last Year: Never true  Transportation Needs: No Transportation Needs   Lack of Transportation (Medical): No   Lack of Transportation (Non-Medical): No  Physical Activity: Inactive   Days of Exercise per Week: 0 days   Minutes of Exercise per Session: 0 min  Stress: No Stress Concern Present   Feeling of Stress : Not at all  Social Connections: Moderately Integrated   Frequency of Communication with Friends and Family: More than three times a week   Frequency of Social Gatherings with Friends and Family: More than three times a week   Attends Religious Services: More than 4 times per year   Active Member of [employer] or Organizations: Yes   Attends Archivist Meetings: More than 4 times per year   Marital Status: Widowed  Human resources officer Violence: Not At Risk   Fear of Current or Ex-Partner: No   Emotionally Abused: No   Physically Abused: No   Sexually Abused: No    Past Surgical History:  Procedure Laterality Date   ABDOMINAL HYSTERECTOMY     APPENDECTOMY  1978    BLADDER REPAIR     BREAST LUMPECTOMY Left 02/24/2011   central partial mastectomy - dr Margot Chimes   BREAST REDUCTION SURGERY     CARDIOVERSION N/A 10/28/2017   Procedure: CARDIOVERSION;  Surgeon: Lelon Perla, MD;  Location: [place] [facility];  Service: Cardiovascular;  Laterality: N/A;   CARDIOVERSION N/A 04/06/2019   Procedure: CARDIOVERSION;  Surgeon: [name], MD;  Location[facility] ENDOSCOPY;  Service: Cardiovascular;  Laterality: N/A;   CARDIOVERSION N/A 09/08/2019   Procedure: CARDIOVERSION;  Surgeon: Dorothy Spark, MD;  Location: [place];  Service: Cardiovascular;  Laterality: N/A;   CARDIOVERSION N/A 08/28/2021   Procedure: CARDIOVERSION;  Surgeon: [name], MD;  Location: [place];  Service: Cardiovascular;  Laterality: N/A;   COLONOSCOPY WITH PROPOFOL N/A 03/24/2017   Procedure: COLONOSCOPY WITH PROPOFOL;  Surgeon: Wilford Corner, MD;  Location: [place];  Service: Endoscopy;  Laterality: N/A;   ESOPHAGOGASTRODUODENOSCOPY (EGD) WITH PROPOFOL N/A 03/24/2017   Procedure: ESOPHAGOGASTRODUODENOSCOPY (EGD) WITH PROPOFOL;  Surgeon: Wilford Corner, MD;  Location: [place];  Service: Endoscopy;  Laterality: N/A;   LIPOMA EXCISION Right    REDUCTION MAMMAPLASTY Bilateral 1998   RETINAL DETACHMENT SURGERY Left    SALPINGOOPHORECTOMY     TEE WITHOUT CARDIOVERSION N/A 10/28/2017   Procedure: TRANSESOPHAGEAL ECHOCARDIOGRAM (TEE);  Surgeon: Lelon Perla, MD;  Location: [facility] ENDOSCOPY;  Service: Cardiovascular;  Laterality: N/A;   [place]   at 88 years of age    Family History  Problem Relation Age of Onset   Pneumonia Father    Heart attack Mother    Hypertension Mother    Stroke Mother    Diabetes Mother    Aortic aneurysm Mother        abdominal aortic aneurysm   Cancer Mother        bladder   Aortic aneurysm Sister        abdominal aortic aneurysm    Allergies  Allergen Reactions   Bactrim Nausea And  Vomiting   Dilaudid [Hydromorphone Hcl] Nausea Only   Hydrocodone Nausea And Vomiting   Invokana [Canagliflozin] Itching and Other (See Comments)    Yeast Infections    Macrodantin Nausea And Vomiting    Current Outpatient Medications on File Prior to Visit  Medication Sig Dispense Refill   acetaminophen (TYLENOL) 500 MG tablet Take 1,000 mg by mouth every 6 (six) hours as needed for mild pain or moderate pain.      apixaban (ELIQUIS) 2.5 MG TABS tablet TAKE ONE TABLET BY MOUTH EVERY MORNING and TAKE ONE TABLET BY MOUTH EVERY EVENING 180 tablet 2   Calcium Carb-Cholecalciferol (CALCIUM+D3 PO) Take 1 tablet by mouth daily with breakfast.  Cholecalciferol (VITAMIN D3) 125 MCG (5000 UT) CAPS TAKE ONE CAPSULE BY MOUTH EVERY MORNING 90 capsule 0   ferrous sulfate 325 (65 FE) MG EC tablet Take 325 mg daily  3   glucose blood (ONE TOUCH ULTRA TEST) test strip Check once daily. E11.40 100 each 11   insulin degludec (TRESIBA FLEXTOUCH) 100 UNIT/ML FlexTouch Pen Inject 24 units into THE SKIN ONCE daily AT 10pm (Patient taking differently: 14-18 Units in the morning.) 15 mL 3   Insulin Pen Needle 29G X 5MM MISC Use once daily 100 each 3   levothyroxine (SYNTHROID) 50 MCG tablet TAKE ONE TABLET BY MOUTH BEFORE BREAKFAST 90 tablet 1   lidocaine (LIDODERM) 5 % Place 1 patch onto the skin See admin instructions. Apply 1 patch to the neck every day     loratadine (CLARITIN) 10 MG tablet Take 10 mg by mouth daily as needed for allergies.     MAGNESIUM PO Take 1 tablet by mouth daily with breakfast.     metoprolol succinate (TOPROL-XL) 25 MG 24 hr tablet Take 0.5 tablets (12.5 mg total) by mouth every morning. 45 tablet 3   omeprazole (PRILOSEC) 20 MG capsule Take 1 capsule (20 mg total) by mouth as needed.     ONE TOUCH ULTRA TEST test strip CHECK ONCE A DAY 50 each 3   potassium chloride (KLOR-CON) 10 MEQ tablet Take 1 tablet (10 mEq total) by mouth 2 (two) times daily. 270 tablet 1   torsemide  (DEMADEX) 20 MG tablet Take 1 tablet (20 mg total) by mouth daily as needed. Take daily prn weight gain or swelling 90 tablet 3   traZODone (DESYREL) 50 MG tablet Take 0.5-1 tablet by mouth at bedtime 30 tablet 0   vitamin B-12 (CYANOCOBALAMIN) 1000 MCG tablet Take 1 tablet (1,000 mcg total) by mouth daily. 90 tablet 3   No current facility-administered medications on file prior to visit.    BP 118/60   Pulse (!) 103   Temp 98.5 F (36.9 C) (Oral)   Ht '5\' 1"'$  (1.549 m)   Wt 118 lb (53.5 kg)   SpO2 96%   BMI 22.30 kg/m       Objective:   Physical Exam Vitals and nursing note reviewed.  Constitutional:      Appearance: Normal appearance.  Skin:    General: Skin is warm and dry.     Capillary Refill: Capillary refill takes less than 2 seconds.     Findings: Rash present. Rash is vesicular.          Comments: Vesicualar rash noted on left flank. No active drainage   Neurological:     General: No focal deficit present.     Mental Status: She is alert and oriented to person, place, and time.  Psychiatric:        Mood and Affect: Mood normal.        Behavior: Behavior normal.        Thought Content: Thought content normal.        Judgment: Judgment normal.      Assessment & Plan:  1. Disseminated herpes zoster - Exam consistent with shingles - Will prescribe valtrex 1000 mg TID x 10 days. Can stop topical acyclovir  - Tylenol for pain relief, can also try using OTC lidocaine spray  - valACYclovir (VALTREX) 1000 MG tablet; Take 1 tablet (1,000 mg total) by mouth 3 (three) times daily for 10 days.  Dispense: 30 tablet; Refill: 0  Dorothyann Peng, NP

## 2021-12-04 NOTE — Telephone Encounter (Signed)
Pt informed of the message below.

## 2021-12-05 ENCOUNTER — Telehealth: Payer: Self-pay | Admitting: Pharmacist

## 2021-12-05 ENCOUNTER — Ambulatory Visit (INDEPENDENT_AMBULATORY_CARE_PROVIDER_SITE_OTHER): Payer: Medicare Other | Admitting: Licensed Clinical Social Worker

## 2021-12-05 DIAGNOSIS — E1165 Type 2 diabetes mellitus with hyperglycemia: Secondary | ICD-10-CM

## 2021-12-05 DIAGNOSIS — I4819 Other persistent atrial fibrillation: Secondary | ICD-10-CM

## 2021-12-05 DIAGNOSIS — G8929 Other chronic pain: Secondary | ICD-10-CM

## 2021-12-05 NOTE — Progress Notes (Signed)
Per Jeni Salles Call to offer patients daughter an appointment to go over medications per request from Social Work.  Call to [facility], advised patient has a follow up with Pharmacist  scheduled 12/17/21 and asked if she would like one on 12/08/21 instead. Daughter reports she was unaware that there was an appointment on the 14 th, she just wanted to be sure someone had been following closely her mom's medications including adherence and changes with her seeing so many providers.  Assured her that her mom has contact with the pharmacist regularly and I speak to her atleast once a month sometimes more often, to coordinate her medication delivery with the pharmacy and we go over changes from other offices when we speak.  Daughter expressed that she was happy with that and she was ok to leave the appointment on the 14th for her mom to do on her own.  Provided her my direct contact information in the event she has any questions or would like to reach out in the future.    [place] Clinical Pharmacist Assistant [phone]

## 2021-12-05 NOTE — Chronic Care Management (AMB) (Signed)
Chronic Care Management    Clinical Social Work Note  12/05/2021 Name: Julie Norton MRN: [id number] DOB: 12/21/1929  Julie Norton is a 88 y.o. year old female who is a primary care patient of Burchette, Alinda Sierras, MD. The CCM team was consulted to assist the patient with chronic disease management and/or care coordination needs related to: Level of Care Concerns.   Engaged with patient's adult children, Clarene Critchley and [place], by telephone for follow up visit in response to provider referral for social work chronic care management and care coordination services.   Consent to Services:  The patient was given information about Chronic Care Management services, agreed to services, and gave verbal consent prior to initiation of services.  Please see initial visit note for detailed documentation.   Patient agreed to services and consent obtained.   Assessment: Review of patient past medical history, allergies, medications, and health status, including review of relevant consultants reports was performed today as part of a comprehensive evaluation and provision of chronic care management and care coordination services.     SDOH (Social Determinants of Health) assessments and interventions performed:    Advanced Directives Status: Not addressed in this encounter.  CCM Care Plan  Allergies  Allergen Reactions   Bactrim Nausea And Vomiting   Dilaudid [Hydromorphone Hcl] Nausea Only   Hydrocodone Nausea And Vomiting   Invokana [Canagliflozin] Itching and Other (See Comments)    Yeast Infections    Macrodantin Nausea And Vomiting    Outpatient Encounter Medications as of 12/05/2021  Medication Sig   acetaminophen (TYLENOL) 500 MG tablet Take 1,000 mg by mouth every 6 (six) hours as needed for mild pain or moderate pain.    apixaban (ELIQUIS) 2.5 MG TABS tablet TAKE ONE TABLET BY MOUTH EVERY MORNING and TAKE ONE TABLET BY MOUTH EVERY EVENING   Calcium Carb-Cholecalciferol (CALCIUM+D3 PO)  Take 1 tablet by mouth daily with breakfast.   Cholecalciferol (VITAMIN D3) 125 MCG (5000 UT) CAPS TAKE ONE CAPSULE BY MOUTH EVERY MORNING   ferrous sulfate 325 (65 FE) MG EC tablet Take 325 mg daily   glucose blood (ONE TOUCH ULTRA TEST) test strip Check once daily. E11.40   insulin degludec (TRESIBA FLEXTOUCH) 100 UNIT/ML FlexTouch Pen Inject 24 units into THE SKIN ONCE daily AT 10pm (Patient taking differently: 14-18 Units in the morning.)   Insulin Pen Needle 29G X 5MM MISC Use once daily   levothyroxine (SYNTHROID) 50 MCG tablet TAKE ONE TABLET BY MOUTH BEFORE BREAKFAST   lidocaine (LIDODERM) 5 % Place 1 patch onto the skin See admin instructions. Apply 1 patch to the neck every day   loratadine (CLARITIN) 10 MG tablet Take 10 mg by mouth daily as needed for allergies.   MAGNESIUM PO Take 1 tablet by mouth daily with breakfast.   metoprolol succinate (TOPROL-XL) 25 MG 24 hr tablet Take 0.5 tablets (12.5 mg total) by mouth every morning.   omeprazole (PRILOSEC) 20 MG capsule Take 1 capsule (20 mg total) by mouth as needed.   ONE TOUCH ULTRA TEST test strip CHECK ONCE A DAY   potassium chloride (KLOR-CON) 10 MEQ tablet Take 1 tablet (10 mEq total) by mouth 2 (two) times daily.   torsemide (DEMADEX) 20 MG tablet Take 1 tablet (20 mg total) by mouth daily as needed. Take daily prn weight gain or swelling   traZODone (DESYREL) 50 MG tablet Take 0.5-1 tablet by mouth at bedtime   valACYclovir (VALTREX) 1000 MG tablet Take 1 tablet (1,000 mg  total) by mouth 3 (three) times daily for 10 days.   vitamin B-12 (CYANOCOBALAMIN) 1000 MCG tablet Take 1 tablet (1,000 mcg total) by mouth daily.   No facility-administered encounter medications on file as of 12/05/2021.    Patient Active Problem List   Diagnosis Date Noted   Secondary hypercoagulable state ([place]) 09/05/2021   Persistent atrial fibrillation ([place]) 08/26/2021   Chronic insomnia 01/21/2021   Degeneration of lumbar intervertebral disc  02/03/2019   GERD (gastroesophageal reflux disease) 11/30/2017   Other persistent atrial fibrillation ([place])    Injury of muscle of rotator cuff 10/12/2017   Iron deficiency anemia, unspecified 03/24/2017   Poorly controlled type 2 diabetes mellitus ([place]) 02/23/2017   Mitral insufficiency 02/04/2016   Right bundle branch block 05/16/2015   Chest pain of unknown etiology 05/16/2015   Peripheral neuropathy 08/23/2014   Heart murmur previously undiagnosed 01/22/2014   Abdominal pain 11/11/2011   Ductal carcinoma in situ (DCIS) of left breast 01/12/2011   Hypercholesterolemia 11/28/2010   Hypothyroidism 11/28/2010   Postmenopausal state 11/28/2010   Osteoarthritis 11/28/2010   Dyspepsia 11/28/2010    Conditions to be addressed/monitored: Atrial Fibrillation, DMII, and Osteoarthritis  Care Plan : Plan of Care  Updates made by Rebekah [place], LCSW since 12/05/2021 12:00 AM     Problem: Quality of Life (General Plan of Care)      Goal: Quality of Life Maintained   Start Date: 11/13/2021  Expected End Date: 03/05/2022  This Visit's Progress: On track  Recent Progress: On track  Priority: High  Note:   Current Barriers:  Level of Care Concerns:Facility placement (SNF)  CSW Clinical Goal(s):  Patient  will verbalize basic understanding of Osteoporosis disease process and self health management plan    through collaboration with Clinical Social Worker, provider, and care team.   Interventions: All hx provided by patient's adult children, Clarene Critchley and [place] Patient was diagnosed with Shingles. Per Clarene Critchley and [place], pt endorses less discomfort with prescribed medication Family endorses improvement in pt's gait and mobility with current outpatient PT. She is expected to have approx 12 sessions in 8 weeks.  Patient has had a shower seat ordered to promote safety Family are interested in speaking with CCM Pharmacist to review pt's medications. LCSW collaborated with Pharmacist to schedule  appt with family Pt has a strong support system  Inter-disciplinary care team collaboration (see longitudinal plan of care) Evaluation of current treatment plan related to  self management and patient's adherence to plan as established by provider Review resources, discussed options and provided patient information about  Enhanced Benefits connected with insurance provider:([facility] [place] ([facility]) :(Pt does not have MA) Facility placement process :Emailed supportive info Solution-Focused Strategies employed:  Active listening / Reflection utilized  Emotional Support Provided Caregiver stress acknowledged  Consideration of in-home help encouraged : options discussed Verbalization of feelings encouraged    Task & activities to accomplish goals: Review and utilize supportive information provided Contact PCP office with any questions or concerns Attend scheduled medical appointments        Follow Up Plan: Appointment scheduled for SW follow up with client by phone on: 02/13/22      Christa See, MSW, [place] [facility] [email or web address] Phone [phone] 9:48 AM

## 2021-12-05 NOTE — Patient Instructions (Signed)
Visit Information  Thank you for taking time to visit with me today. Please don't hesitate to contact me if I can be of assistance to you before our next scheduled telephone appointment.  Following are the goals we discussed today:  Task & activities to accomplish goals: Review and utilize supportive information provided Contact PCP office with any questions or concerns Attend scheduled medical appointments  Our next appointment is by telephone on 02/13/22 at 9:00 AM  Please call the care guide team at [phone] if you need to cancel or reschedule your appointment.   If you are experiencing a Mental Health or [place] or need someone to talk to, please call 911   Patient verbalizes understanding of instructions and care plan provided today and agrees to view in [place]. Active MyChart status and patient understanding of how to access instructions and care plan via MyChart confirmed with patient.     Christa See, MSW, [place] [facility] [email or web address] Phone [phone] 9:49 AM

## 2021-12-06 DIAGNOSIS — E039 Hypothyroidism, unspecified: Secondary | ICD-10-CM | POA: Diagnosis not present

## 2021-12-06 DIAGNOSIS — Z794 Long term (current) use of insulin: Secondary | ICD-10-CM | POA: Diagnosis not present

## 2021-12-06 DIAGNOSIS — Z9181 History of falling: Secondary | ICD-10-CM | POA: Diagnosis not present

## 2021-12-06 DIAGNOSIS — Z7901 Long term (current) use of anticoagulants: Secondary | ICD-10-CM | POA: Diagnosis not present

## 2021-12-06 DIAGNOSIS — Z9049 Acquired absence of other specified parts of digestive tract: Secondary | ICD-10-CM | POA: Diagnosis not present

## 2021-12-06 DIAGNOSIS — I4510 Unspecified right bundle-branch block: Secondary | ICD-10-CM | POA: Diagnosis not present

## 2021-12-06 DIAGNOSIS — D6869 Other thrombophilia: Secondary | ICD-10-CM | POA: Diagnosis not present

## 2021-12-06 DIAGNOSIS — K219 Gastro-esophageal reflux disease without esophagitis: Secondary | ICD-10-CM | POA: Diagnosis not present

## 2021-12-06 DIAGNOSIS — I340 Nonrheumatic mitral (valve) insufficiency: Secondary | ICD-10-CM | POA: Diagnosis not present

## 2021-12-06 DIAGNOSIS — M1990 Unspecified osteoarthritis, unspecified site: Secondary | ICD-10-CM | POA: Diagnosis not present

## 2021-12-06 DIAGNOSIS — E1142 Type 2 diabetes mellitus with diabetic polyneuropathy: Secondary | ICD-10-CM | POA: Diagnosis not present

## 2021-12-06 DIAGNOSIS — R627 Adult failure to thrive: Secondary | ICD-10-CM | POA: Diagnosis not present

## 2021-12-06 DIAGNOSIS — Z853 Personal history of malignant neoplasm of breast: Secondary | ICD-10-CM | POA: Diagnosis not present

## 2021-12-06 DIAGNOSIS — I4819 Other persistent atrial fibrillation: Secondary | ICD-10-CM | POA: Diagnosis not present

## 2021-12-06 DIAGNOSIS — E7800 Pure hypercholesterolemia, unspecified: Secondary | ICD-10-CM | POA: Diagnosis not present

## 2021-12-06 DIAGNOSIS — M5136 Other intervertebral disc degeneration, lumbar region: Secondary | ICD-10-CM | POA: Diagnosis not present

## 2021-12-06 DIAGNOSIS — D509 Iron deficiency anemia, unspecified: Secondary | ICD-10-CM | POA: Diagnosis not present

## 2021-12-08 DIAGNOSIS — Z9049 Acquired absence of other specified parts of digestive tract: Secondary | ICD-10-CM | POA: Diagnosis not present

## 2021-12-08 DIAGNOSIS — D6869 Other thrombophilia: Secondary | ICD-10-CM | POA: Diagnosis not present

## 2021-12-08 DIAGNOSIS — I4510 Unspecified right bundle-branch block: Secondary | ICD-10-CM | POA: Diagnosis not present

## 2021-12-08 DIAGNOSIS — E039 Hypothyroidism, unspecified: Secondary | ICD-10-CM | POA: Diagnosis not present

## 2021-12-08 DIAGNOSIS — Z9181 History of falling: Secondary | ICD-10-CM | POA: Diagnosis not present

## 2021-12-08 DIAGNOSIS — I4819 Other persistent atrial fibrillation: Secondary | ICD-10-CM | POA: Diagnosis not present

## 2021-12-08 DIAGNOSIS — M1990 Unspecified osteoarthritis, unspecified site: Secondary | ICD-10-CM | POA: Diagnosis not present

## 2021-12-08 DIAGNOSIS — D509 Iron deficiency anemia, unspecified: Secondary | ICD-10-CM | POA: Diagnosis not present

## 2021-12-08 DIAGNOSIS — E7800 Pure hypercholesterolemia, unspecified: Secondary | ICD-10-CM | POA: Diagnosis not present

## 2021-12-08 DIAGNOSIS — E1142 Type 2 diabetes mellitus with diabetic polyneuropathy: Secondary | ICD-10-CM | POA: Diagnosis not present

## 2021-12-08 DIAGNOSIS — Z794 Long term (current) use of insulin: Secondary | ICD-10-CM | POA: Diagnosis not present

## 2021-12-08 DIAGNOSIS — R627 Adult failure to thrive: Secondary | ICD-10-CM | POA: Diagnosis not present

## 2021-12-08 DIAGNOSIS — M5136 Other intervertebral disc degeneration, lumbar region: Secondary | ICD-10-CM | POA: Diagnosis not present

## 2021-12-08 DIAGNOSIS — Z7901 Long term (current) use of anticoagulants: Secondary | ICD-10-CM | POA: Diagnosis not present

## 2021-12-08 DIAGNOSIS — K219 Gastro-esophageal reflux disease without esophagitis: Secondary | ICD-10-CM | POA: Diagnosis not present

## 2021-12-08 DIAGNOSIS — I340 Nonrheumatic mitral (valve) insufficiency: Secondary | ICD-10-CM | POA: Diagnosis not present

## 2021-12-08 DIAGNOSIS — Z853 Personal history of malignant neoplasm of breast: Secondary | ICD-10-CM | POA: Diagnosis not present

## 2021-12-09 ENCOUNTER — Encounter: Payer: Medicare Other | Admitting: Physical Therapy

## 2021-12-10 DIAGNOSIS — I4510 Unspecified right bundle-branch block: Secondary | ICD-10-CM | POA: Diagnosis not present

## 2021-12-10 DIAGNOSIS — K219 Gastro-esophageal reflux disease without esophagitis: Secondary | ICD-10-CM | POA: Diagnosis not present

## 2021-12-10 DIAGNOSIS — M1990 Unspecified osteoarthritis, unspecified site: Secondary | ICD-10-CM | POA: Diagnosis not present

## 2021-12-10 DIAGNOSIS — Z7901 Long term (current) use of anticoagulants: Secondary | ICD-10-CM | POA: Diagnosis not present

## 2021-12-10 DIAGNOSIS — E1142 Type 2 diabetes mellitus with diabetic polyneuropathy: Secondary | ICD-10-CM | POA: Diagnosis not present

## 2021-12-10 DIAGNOSIS — Z9049 Acquired absence of other specified parts of digestive tract: Secondary | ICD-10-CM | POA: Diagnosis not present

## 2021-12-10 DIAGNOSIS — E039 Hypothyroidism, unspecified: Secondary | ICD-10-CM | POA: Diagnosis not present

## 2021-12-10 DIAGNOSIS — M5136 Other intervertebral disc degeneration, lumbar region: Secondary | ICD-10-CM | POA: Diagnosis not present

## 2021-12-10 DIAGNOSIS — Z794 Long term (current) use of insulin: Secondary | ICD-10-CM | POA: Diagnosis not present

## 2021-12-10 DIAGNOSIS — D509 Iron deficiency anemia, unspecified: Secondary | ICD-10-CM | POA: Diagnosis not present

## 2021-12-10 DIAGNOSIS — D6869 Other thrombophilia: Secondary | ICD-10-CM | POA: Diagnosis not present

## 2021-12-10 DIAGNOSIS — I340 Nonrheumatic mitral (valve) insufficiency: Secondary | ICD-10-CM | POA: Diagnosis not present

## 2021-12-10 DIAGNOSIS — R627 Adult failure to thrive: Secondary | ICD-10-CM | POA: Diagnosis not present

## 2021-12-10 DIAGNOSIS — I4819 Other persistent atrial fibrillation: Secondary | ICD-10-CM | POA: Diagnosis not present

## 2021-12-10 DIAGNOSIS — E7800 Pure hypercholesterolemia, unspecified: Secondary | ICD-10-CM | POA: Diagnosis not present

## 2021-12-10 DIAGNOSIS — Z9181 History of falling: Secondary | ICD-10-CM | POA: Diagnosis not present

## 2021-12-10 DIAGNOSIS — Z853 Personal history of malignant neoplasm of breast: Secondary | ICD-10-CM | POA: Diagnosis not present

## 2021-12-11 ENCOUNTER — Encounter: Payer: Medicare Other | Admitting: Physical Therapy

## 2021-12-16 ENCOUNTER — Encounter: Payer: Medicare Other | Admitting: Physical Therapy

## 2021-12-16 ENCOUNTER — Telehealth: Payer: Self-pay | Admitting: Pharmacist

## 2021-12-16 NOTE — Chronic Care Management (AMB) (Signed)
    Chronic Care Management Pharmacy Assistant   Name: Julie Norton  MRN: [id number] DOB: 08-16-1929  12/16/21 APPOINTMENT REMINDER    Patient was reminded to have all medications, supplements and any blood glucose and blood pressure readings available for review with Jeni Salles, Pharm. D, for telephone visit on 12/17/21 at 4.    Care Gaps: AWV- 3/23 BP- 118/60 ( 12/04/21) Lab Results  Component Value Date   HGBA1C 7.4 (H) 10/20/2021    Star Rating Drug: None  Medications: Outpatient Encounter Medications as of 12/16/2021  Medication Sig   acetaminophen (TYLENOL) 500 MG tablet Take 1,000 mg by mouth every 6 (six) hours as needed for mild pain or moderate pain.    apixaban (ELIQUIS) 2.5 MG TABS tablet TAKE ONE TABLET BY MOUTH EVERY MORNING and TAKE ONE TABLET BY MOUTH EVERY EVENING   Calcium Carb-Cholecalciferol (CALCIUM+D3 PO) Take 1 tablet by mouth daily with breakfast.   Cholecalciferol (VITAMIN D3) 125 MCG (5000 UT) CAPS TAKE ONE CAPSULE BY MOUTH EVERY MORNING   ferrous sulfate 325 (65 FE) MG EC tablet Take 325 mg daily   glucose blood (ONE TOUCH ULTRA TEST) test strip Check once daily. E11.40   insulin degludec (TRESIBA FLEXTOUCH) 100 UNIT/ML FlexTouch Pen Inject 24 units into THE SKIN ONCE daily AT 10pm (Patient taking differently: 14-18 Units in the morning.)   Insulin Pen Needle 29G X 5MM MISC Use once daily   levothyroxine (SYNTHROID) 50 MCG tablet TAKE ONE TABLET BY MOUTH BEFORE BREAKFAST   lidocaine (LIDODERM) 5 % Place 1 patch onto the skin See admin instructions. Apply 1 patch to the neck every day   loratadine (CLARITIN) 10 MG tablet Take 10 mg by mouth daily as needed for allergies.   MAGNESIUM PO Take 1 tablet by mouth daily with breakfast.   metoprolol succinate (TOPROL-XL) 25 MG 24 hr tablet Take 0.5 tablets (12.5 mg total) by mouth every morning.   omeprazole (PRILOSEC) 20 MG capsule Take 1 capsule (20 mg total) by mouth as needed.   ONE TOUCH ULTRA TEST  test strip CHECK ONCE A DAY   potassium chloride (KLOR-CON) 10 MEQ tablet Take 1 tablet (10 mEq total) by mouth 2 (two) times daily.   torsemide (DEMADEX) 20 MG tablet Take 1 tablet (20 mg total) by mouth daily as needed. Take daily prn weight gain or swelling   traZODone (DESYREL) 50 MG tablet Take 0.5-1 tablet by mouth at bedtime   vitamin B-12 (CYANOCOBALAMIN) 1000 MCG tablet Take 1 tablet (1,000 mcg total) by mouth daily.   No facility-administered encounter medications on file as of 12/16/2021.      [place] Clinical Pharmacist Assistant [phone]

## 2021-12-17 ENCOUNTER — Ambulatory Visit: Payer: Medicare Other | Admitting: Pharmacist

## 2021-12-17 DIAGNOSIS — Z794 Long term (current) use of insulin: Secondary | ICD-10-CM | POA: Diagnosis not present

## 2021-12-17 DIAGNOSIS — E1165 Type 2 diabetes mellitus with hyperglycemia: Secondary | ICD-10-CM

## 2021-12-17 DIAGNOSIS — I4819 Other persistent atrial fibrillation: Secondary | ICD-10-CM

## 2021-12-17 DIAGNOSIS — Z9181 History of falling: Secondary | ICD-10-CM | POA: Diagnosis not present

## 2021-12-17 DIAGNOSIS — D509 Iron deficiency anemia, unspecified: Secondary | ICD-10-CM | POA: Diagnosis not present

## 2021-12-17 DIAGNOSIS — E039 Hypothyroidism, unspecified: Secondary | ICD-10-CM | POA: Diagnosis not present

## 2021-12-17 DIAGNOSIS — M5136 Other intervertebral disc degeneration, lumbar region: Secondary | ICD-10-CM | POA: Diagnosis not present

## 2021-12-17 DIAGNOSIS — K219 Gastro-esophageal reflux disease without esophagitis: Secondary | ICD-10-CM | POA: Diagnosis not present

## 2021-12-17 DIAGNOSIS — Z9049 Acquired absence of other specified parts of digestive tract: Secondary | ICD-10-CM | POA: Diagnosis not present

## 2021-12-17 DIAGNOSIS — E7800 Pure hypercholesterolemia, unspecified: Secondary | ICD-10-CM | POA: Diagnosis not present

## 2021-12-17 DIAGNOSIS — I4510 Unspecified right bundle-branch block: Secondary | ICD-10-CM | POA: Diagnosis not present

## 2021-12-17 DIAGNOSIS — Z853 Personal history of malignant neoplasm of breast: Secondary | ICD-10-CM | POA: Diagnosis not present

## 2021-12-17 DIAGNOSIS — D6869 Other thrombophilia: Secondary | ICD-10-CM | POA: Diagnosis not present

## 2021-12-17 DIAGNOSIS — M1990 Unspecified osteoarthritis, unspecified site: Secondary | ICD-10-CM | POA: Diagnosis not present

## 2021-12-17 DIAGNOSIS — Z7901 Long term (current) use of anticoagulants: Secondary | ICD-10-CM | POA: Diagnosis not present

## 2021-12-17 DIAGNOSIS — E1142 Type 2 diabetes mellitus with diabetic polyneuropathy: Secondary | ICD-10-CM | POA: Diagnosis not present

## 2021-12-17 DIAGNOSIS — I340 Nonrheumatic mitral (valve) insufficiency: Secondary | ICD-10-CM | POA: Diagnosis not present

## 2021-12-17 DIAGNOSIS — R627 Adult failure to thrive: Secondary | ICD-10-CM | POA: Diagnosis not present

## 2021-12-17 NOTE — Progress Notes (Signed)
Chronic Care Management Pharmacy Note  12/17/2021 Name:  Julie Norton MRN:  [id number] DOB:  [birth year]  Summary: A1c at goal < 8% Pt reports she believes she is in the coverage gap   Recommendations/Changes made from today's visit: -Recommended taking iron supplement with food to avoid GI side effects -Recommended stopping calcium supplement -Recommended reaching out to cardiology (or this office) for samples of Eliquis when in the coverage gap -Recommended trial of omeprazole for possible GERD symptoms   Plan: Follow up in 4 months  Subjective: Julie Norton is an 88 y.o. year old female who is a primary patient of Burchette, Elberta Fortis, MD.  The CCM team was consulted for assistance with disease management and care coordination needs.    Engaged with patient by telephone for follow up visit in response to provider referral for pharmacy case management and/or care coordination services.   Consent to Services:  The patient was given information about Chronic Care Management services, agreed to services, and gave verbal consent prior to initiation of services.  Please see initial visit note for detailed documentation.   Patient Care Team: Kristian Covey, MD as PCP - General (Family Medicine) [place], Peter M, MD as PCP - Cardiology (Cardiology) Cyndia Bent, MD as Surgeon (General Surgery) Freddy Finner, MD (Obstetrics and Gynecology) Verner Chol, [facility] as Pharmacist (Pharmacist) Bridgett Larsson, LCSW as Social Worker (Licensed Clinical Social Worker)  Recent office visits: 12/04/21 [name], NP: Patient presented for herpes zoster infection. Prescribed Valtrex 1000 mg TID x 10 days.  11/13/21 Bridgett Larsson, LCSW - Patient presented for CCM Social Work visit. No medication changes.   11/10/21 Burchette, Elberta Fortis, MD - Patient presented for Pelvic joint pain and other concerns. No medication changes.  10/20/21 Panosh, Neta Mends, MD - Patient presented for  Neck muscle weakness and other concerns. No medication changes.  09/17/21 Tillie Rung, LPN - Patient presented for [facility] Annual Wellness Exam.   Recent consult visits: 11/20/21 Vivien Presto, PT - Patient presented for Cervicalgia and other concerns. No medication changes.  11/05/21 Sheilah Pigeon, PA-C (Cardiology) - Patient presented for s/p Tikosyn and other concerns. Stopped Metformin Decreased Eliquis  09/16/21 [place], Peter M, MD - Patient presented for persistent atrial fibrillation and other concerns. Decreased Potassium Chloride. Changed Torsemide.   09/09/21 Patient presented to [facility] for EKG   09/05/21 Danice Goltz, [place] (Cardiology) - Patient presented for Persistent atrial fibrillation and other concerns. No medication changes.  09/04/21 Bossell, Dayna Barker ([facility]) - Patient presented for XR of Cervical spine. No medication changes noted.   08/28/21 Jodelle Red MD (Cardiovascular) Patient presented for Cadioversion Procedure. No medication changes noted.   08/26/21 Newman Nip, NP (Cardiology) - Patient presented for Persistent atrial fibrillation and other concerns. Changed Omeprazole 20 mg   08/18/21 Harlene Salts ([facility]) - Patient presented for Neck pain. No medication changes noted.  07/22/21 Newman Nip, NP ( Cardiology) - Patient presented for persistent atrial fibrillation and other concerns. No medication changes noted.  06/19/21 Newman Nip, NP (Cardiology) - Patient presented for Persistent A-fib and other concerns. Changed Metformin and Changed Trazodone.   Hospital visits: Medication Reconciliation was completed by comparing discharge summary, patient's EMR and Pharmacy list, and upon discussion with patient.   Patient presented to [facility] on 08/26/21 due to Persistent Atrial Fibrillation. Patient was present for 3 days.   New?Medications Started at  Hospital  Discharge:?? -started  dofetilide [name])   Medication Changes at Hospital Discharge: -Changed  none   Medications Discontinued at Hospital Discharge: -Stopped  zolpidem 5 MG tablet   Medications that remain the same after Hospital Discharge:??  -All other medications will remain the same.  Objective:  Lab Results  Component Value Date   CREATININE 0.68 10/20/2021   BUN 12 10/20/2021   GFR 77.15 10/20/2021   GFRNONAA 59 (L) 09/05/2021   GFRAA 75 06/13/2020   NA 140 10/20/2021   K 4.5 10/20/2021   CALCIUM 10.2 10/20/2021   CO2 28 10/20/2021   GLUCOSE 86 10/20/2021    Lab Results  Component Value Date/Time   HGBA1C 7.4 (H) 10/20/2021 03:13 PM   HGBA1C 8.4 (H) 08/28/2021 03:03 AM   GFR 77.15 10/20/2021 03:13 PM   GFR 54.77 (L) 01/21/2021 03:43 PM   MICROALBUR 0.9 04/07/2013 07:58 AM    Last diabetic Eye exam:  Lab Results  Component Value Date/Time   HMDIABEYEEXA No Retinopathy 07/31/2021 12:00 AM    Last diabetic Foot exam:  Lab Results  Component Value Date/Time   HMDIABFOOTEX normal 08/28/2014 12:00 AM     Lab Results  Component Value Date   CHOL 94 06/23/2019   HDL 35.20 (L) 06/23/2019   LDLCALC 45 06/23/2019   TRIG 70.0 06/23/2019   CHOLHDL 3 06/23/2019       Latest Ref Rng & Units 12/11/2019    2:54 PM 06/23/2019    8:25 AM 03/27/2019    7:09 PM  Hepatic Function  Total Protein 6.0 - 8.5 g/dL 6.4  6.6  6.8   Albumin 3.6 - 4.6 g/dL 4.1  4.2  3.8   AST 0 - 40 IU/L 39  16  23   ALT 0 - 32 IU/L 32  9  18   Alk Phosphatase 48 - 121 IU/L 87  60  61   Total Bilirubin 0.0 - 1.2 mg/dL 0.4  0.9  0.7   Bilirubin, Direct 0.0 - 0.3 mg/dL  0.2      Lab Results  Component Value Date/Time   TSH 2.660 06/13/2021 09:03 AM   TSH 0.96 01/21/2021 03:43 PM   FREET4 1.77 12/11/2019 02:54 PM   FREET4 1.92 (H) 09/04/2019 12:49 PM       Latest Ref Rng & Units 10/20/2021    3:13 PM 06/13/2021    9:03 AM 01/21/2021    3:43 PM  CBC  WBC 4.0 - 10.5 K/uL 6.5   4.1  5.9   Hemoglobin 12.0 - 15.0 g/dL 13.8  8.4  10.1   Hematocrit 36.0 - 46.0 % 39.6  28.8  30.4   Platelets 150.0 - 400.0 K/uL 172.0  172  147.0     Lab Results  Component Value Date/Time   VD25OH 52 09/07/2011 09:13 AM    Clinical ASCVD: No  The ASCVD Risk score (Arnett DK, et al., 2019) failed to calculate for the following reasons:   The 2019 ASCVD risk score is only valid for ages 45 to 104       09/17/2021    3:06 PM 09/11/2020    3:09 PM 06/23/2019    8:03 AM  Depression screen PHQ 2/9  Decreased Interest 0 0 0  Down, Depressed, Hopeless 0 0 0  PHQ - 2 Score 0 0 0     CHA2DS2/VAS Stroke Risk Points  Current as of a minute ago     5 >= 2 Points: High Risk  1 - 1.99 Points: Medium Risk  0 Points: Low Risk    Last Change: N/A      Details    This score determines the patient's risk of having a stroke if the  patient has atrial fibrillation.       Points Metrics  1 Has Congestive Heart Failure:  Yes    Current as of a minute ago  0 Has Vascular Disease:  No    Current as of a minute ago  0 Has Hypertension:  No    Current as of a minute ago  2 Age:  60    Current as of a minute ago  1 Has Diabetes:  Yes    Current as of a minute ago  0 Had Stroke:  No  Had TIA:  No  Had Thromboembolism:  No    Current as of a minute ago  1 Female:  Yes    Current as of a minute ago     Social History   Tobacco Use  Smoking Status Never  Smokeless Tobacco Never   BP Readings from Last 3 Encounters:  12/04/21 118/60  11/10/21 136/70  11/05/21 135/84   Pulse Readings from Last 3 Encounters:  12/04/21 (!) 103  11/10/21 90  11/05/21 91   Wt Readings from Last 3 Encounters:  12/04/21 118 lb (53.5 kg)  11/10/21 118 lb 6.4 oz (53.7 kg)  11/05/21 121 lb (54.9 kg)   BMI Readings from Last 3 Encounters:  12/04/21 22.30 kg/m  11/10/21 22.37 kg/m  11/05/21 22.86 kg/m    Assessment/Interventions: Review of patient past medical history, allergies, medications,  health status, including review of consultants reports, laboratory and other test data, was performed as part of comprehensive evaluation and provision of chronic care management services.   SDOH:  (Social Determinants of Health) assessments and interventions performed: No  SDOH Screenings   Alcohol Screen: Low Risk  (09/17/2021)   Alcohol Screen    Last Alcohol Screening Score (AUDIT): 0  Depression (PHQ2-9): Low Risk  (09/17/2021)   Depression (PHQ2-9)    PHQ-2 Score: 0  Financial Resource Strain: Low Risk  (09/17/2021)   Overall Financial Resource Strain (CARDIA)    Difficulty of Paying Living Expenses: Not hard at all  Food Insecurity: No Food Insecurity (09/17/2021)   Hunger Vital Sign    Worried About Running Out of Food in the Last Year: Never true    Ran Out of Food in the Last Year: Never true  Housing: Low Risk  (09/17/2021)   Housing    Last Housing Risk Score: 0  Physical Activity: Inactive (09/17/2021)   Exercise Vital Sign    Days of Exercise per Week: 0 days    Minutes of Exercise per Session: 0 min  Social Connections: Moderately Integrated (09/17/2021)   Social Connection and Isolation Panel [NHANES]    Frequency of Communication with Friends and Family: More than three times a week    Frequency of Social Gatherings with Friends and Family: More than three times a week    Attends Religious Services: More than 4 times per year    Active Member of [employer] or Organizations: Yes    Attends Banker Meetings: More than 4 times per year    Marital Status: Widowed  Stress: No Stress Concern Present (09/17/2021)   [employer] of Occupational Health - Occupational Stress Questionnaire    Feeling of Stress : Not at all  Tobacco Use: Low Risk  (12/04/2021)  Patient History    Smoking Tobacco Use: Never    Smokeless Tobacco Use: Never    Passive Exposure: Not on file  Transportation Needs: No Transportation Needs (09/17/2021)   PRAPARE - Transportation    Lack  of Transportation (Medical): No    Lack of Transportation (Non-Medical): No    CCM Care Plan  Allergies  Allergen Reactions   Bactrim Nausea And Vomiting   Dilaudid [Hydromorphone Hcl] Nausea Only   Hydrocodone Nausea And Vomiting   Invokana [Canagliflozin] Itching and Other (See Comments)    Yeast Infections    Macrodantin Nausea And Vomiting    Medications Reviewed Today     Reviewed by Viona Gilmore, [facility] (Pharmacist) on 12/17/21 at [place] List Status: <None>   Medication Order Taking? Sig Documenting Provider Last Dose Status Informant  acetaminophen (TYLENOL) 500 MG tablet [id number]  Take 1,000 mg by mouth every 6 (six) hours as needed for mild pain or moderate pain.  [provider]  Active Self  apixaban (ELIQUIS) 2.5 MG TABS tablet [id number]  TAKE ONE TABLET BY MOUTH EVERY MORNING and TAKE ONE TABLET BY MOUTH EVERY EVENING [place], PA-C  Active   Cholecalciferol (VITAMIN D3) 125 MCG (5000 UT) CAPS [id number]  TAKE ONE CAPSULE BY MOUTH EVERY MORNING Burchette, Alinda Sierras, MD  Active Self  ferrous sulfate 325 (65 FE) MG EC tablet [id number] Yes Take 325 mg daily Martinique, Peter M, MD Taking Active Self  glucose blood (ONE TOUCH ULTRA TEST) test strip [id number]  Check once daily. E11.40 Eulas Post, MD  Active Self  insulin degludec (TRESIBA FLEXTOUCH) 100 UNIT/ML FlexTouch Pen [id number] Yes Inject 24 units into THE SKIN ONCE daily AT 10pm  Patient taking differently: 14-18 Units in the morning.   Eulas Post, MD Taking Active Self  Insulin Pen Needle 29G X 5MM MISC [id number]  Use once daily Eulas Post, MD  Active Self  levothyroxine (SYNTHROID) 50 MCG tablet [id number]  TAKE ONE TABLET BY MOUTH BEFORE BREAKFAST Burchette, Alinda Sierras, MD  Active Self  lidocaine (LIDODERM) 5 % [id number]  Place 1 patch onto the skin See admin instructions. Apply 1 patch to the neck every day [provider]  Active Self           Med Note Constance Haw,  JACQUELINE L   Wed Nov 05, 2021  2:41 PM)    loratadine (CLARITIN) 10 MG tablet [id number]  Take 10 mg by mouth daily as needed for allergies. [provider]  Active Self  MAGNESIUM PO [id number] Yes Take 250 mg by mouth daily with breakfast. [provider] Taking Active Self           Med Note Shon Baton, Kennis Carina   Fri Sep 05, 2021 10:11 AM)    metoprolol succinate (TOPROL-XL) 25 MG 24 hr tablet [id number] Yes Take 0.5 tablets (12.5 mg total) by mouth every morning. [name], NP Taking Active   omeprazole (PRILOSEC) 20 MG capsule [id number]  Take 1 capsule (20 mg total) by mouth as needed. [name], NP  Active Self  ONE TOUCH ULTRA TEST test strip [id number]  CHECK ONCE A DAY Burchette, Alinda Sierras, MD  Active Self  potassium chloride (KLOR-CON) 10 MEQ tablet [id number]  Take 1 tablet (10 mEq total) by mouth 2 (two) times daily. Martinique, Peter M, MD  Active   torsemide (DEMADEX) 20 MG tablet [id number]  Take 1 tablet (20 mg total) by mouth daily as needed.  Take daily prn weight gain or swelling [place]ique, Peter M, MD  Active   traZODone (DESYREL) 50 MG tablet [id number]  Take 0.5-1 tablet by mouth at bedtime Laurey Morale, MD  Active   vitamin B-12 (CYANOCOBALAMIN) 1000 MCG tablet [id number] Yes Take 1 tablet (1,000 mcg total) by mouth daily. Eulas Post, MD Taking Active Self            Patient Active Problem List   Diagnosis Date Noted   Secondary hypercoagulable state [facility]) 09/05/2021   Persistent atrial fibrillation ([place]) 08/26/2021   Chronic insomnia 01/21/2021   Degeneration of lumbar intervertebral disc 02/03/2019   GERD (gastroesophageal reflux disease) 11/30/2017   Other persistent atrial fibrillation ([place])    Injury of muscle of rotator cuff 10/12/2017   Iron deficiency anemia, unspecified 03/24/2017   Poorly controlled type 2 diabetes mellitus ([place]) 02/23/2017   Mitral insufficiency 02/04/2016   Right bundle branch block 05/16/2015   Chest pain of  unknown etiology 05/16/2015   Peripheral neuropathy 08/23/2014   Heart murmur previously undiagnosed 01/22/2014   Abdominal pain 11/11/2011   Ductal carcinoma in situ (DCIS) of left breast 01/12/2011   Hypercholesterolemia 11/28/2010   Hypothyroidism 11/28/2010   Postmenopausal state 11/28/2010   Osteoarthritis 11/28/2010   Dyspepsia 11/28/2010    Immunization History  Administered Date(s) Administered   Fluad Quad(high Dose 65+) 05/02/2019, 06/19/2020, 05/07/2021   Influenza Split 07/27/2011   Influenza, High Dose Seasonal PF 05/01/2015, 04/22/2016, 04/22/2017   Influenza,inj,Quad PF,6+ Mos 04/21/2013, 05/07/2014, 03/28/2018   Influenza,inj,quad, With Preservative 04/05/2017   Moderna Sars-Covid-2 Vaccination 10/20/2019, 11/03/2019   Pneumococcal Conjugate-13 03/03/2016   Tdap 08/28/2014   Patient reports she is feeling nauseous from one of her medications. She has to force everything down right now. Patient is going to stop taking the calcium.   Conditions to be addressed/monitored:  Hyperlipidemia, Diabetes, Atrial Fibrillation, Heart Failure, GERD, Hypothyroidism, Osteoarthritis and Vitamin B12 deficiency  Conditions addressed this visit: Diabetes, Atrial fibrillation  Care Plan : CCM Pharmacy Care Plan  Updates made by Viona Gilmore, [place] since 12/17/2021 12:00 AM     Problem: Problem: Hyperlipidemia, Diabetes, Atrial Fibrillation, Heart Failure, GERD, Hypothyroidism, Osteoarthritis and Vitamin B12 deficiency      Long-Range Goal: Patient-Specific Goal   Start Date: 11/13/2020  Expected End Date: 11/13/2021  Recent Progress: On track  Priority: High  Note:   Current Barriers:  Unable to independently afford treatment regimen Unable to independently monitor therapeutic efficacy  Pharmacist Clinical Goal(s):  Patient will achieve adherence to monitoring guidelines and medication adherence to achieve therapeutic efficacy through collaboration with PharmD and  provider.   Interventions: 1:1 collaboration with Eulas Post, MD regarding development and update of comprehensive plan of care as evidenced by provider attestation and co-signature Inter-disciplinary care team collaboration (see longitudinal plan of care) Comprehensive medication review performed; medication list updated in electronic medical record  Hyperlipidemia: (LDL goal < 100) -Controlled -Current treatment: No medications -Medications previously tried: atorvastatin (muscle pain and low LDL)  -Current dietary patterns: did not discuss -Current exercise habits: limited -Educated on Cholesterol goals;  Exercise goal of 150 minutes per week; -Counseled on diet and exercise extensively  Diabetes (A1c goal <8%) -Controlled -Current medications: [name] Flextouch inject 14-18 units once daily at 10 PM - Appropriate, Effective, Safe, Accessible -Medications previously tried: glimepiride, Invokana (yeast infections)  , metformin -Current home glucose readings fasting glucose: 105, 120s usually post prandial glucose: does not check -Denies hypoglycemic/hyperglycemic symptoms -Current meal patterns:  breakfast: did not discuss lunch: did not discuss dinner: did not discuss snacks: did not discuss drinks: did not discuss -Current exercise: limited -Educated on A1c and blood sugar goals; Exercise goal of 150 minutes per week; Benefits of routine self-monitoring of blood sugar; Carbohydrate counting and/or plate method -Counseled to check feet daily and get yearly eye exams -Counseled on diet and exercise extensively Recommended to continue current medication  Atrial Fibrillation (Goal: prevent stroke and major bleeding) -Controlled -CHADSVASC: 5 -Current treatment: Rate control: metoprolol succinate (Toprol XL) 25 mg, 1/2 tablet once daily - Appropriate, Effective, Safe, Accessible  Anticoagulation: apixaban (Eliquis) 2.5 mg, 1 tablet twice daily - Appropriate,  Effective, Safe, Accessible -Medications previously tried: Tikosyn, Flecainide (nausea), amiodarone (side effects) -Home BP and HR readings: did not provide  -Counseled on importance of adherence to anticoagulant exactly as prescribed; avoidance of NSAIDs due to increased bleeding risk with anticoagulants; -Recommended to continue current medication  Heart Failure (Goal: manage symptoms and prevent exacerbations) -Controlled -Last ejection fraction: 50-55% (Date: 10/28/2017) -HF type: Diastolic -NYHA Class: II (slight limitation of activity) -AHA HF Stage: C (Heart disease and symptoms present) -Current treatment: Metoprolol succinate (Toprol XL) 25 mg, 1/2 tablet once daily - Appropriate, Effective, Safe, Accessible Torsemide 20 mg, 1 tablet once daily - Appropriate, Effective, Safe, Accessible  -Medications previously tried: none  -Current home BP/HR readings: did not provide -Current dietary habits: limits salt intake -Current exercise habits: limited -Educated on Benefits of medications for managing symptoms and prolonging life Importance of weighing daily; if you gain more than 3 pounds in one day or 5 pounds in one week, call cardiologist -Counseled on diet and exercise extensively Recommended to continue current medication Patient is weighing herself daily  Hypothyroidism (Goal: TSH 0.35-4.5) -Controlled -Current treatment  Levothyroxine 50 mcg, 1 tablet once daily - Appropriate, Effective, Safe, Accessible  -Medications previously tried: none  -Recommended to continue current medication Counseled on administration of levothyroxine on an empty stomach, at least 30 minutes before first meal   GERD (Goal: minimize symptoms) -Controlled -Current treatment  Omeprazole 20 mg, 1 capsule once daily - Appropriate, Effective, Safe, Accessible  -Medications previously tried: none  -Recommended to continue current medication  Osteoarthritis (Goal: minimize symptoms) -Not ideally  controlled -Current treatment  APAP 500 mg, 2 tablets every six hours as needed for mild pain or moderate pain - Appropriate, Effective, Safe, Accessible -Medications previously tried: cortisone shots (did not help)  -Recommended to continue current medication  Vitamin D deficiency (Goal: 30-100) -Controlled -Current treatment  Vitamin D3 5000 units, 1 capsule once daily - Appropriate, Effective, Safe, Accessible  -Medications previously tried: none  -Recommended repeat vitamin D level  Allergic rhinitis (Goal: minimize symptoms) -Controlled -Current treatment  Loratadine 10 mg, 1 tablet once daily as needed for allergies - Appropriate, Effective, Safe, Accessible -Medications previously tried: none  -Recommended to continue current medication  Insomnia (Goal: improve quality and quantity of sleep) -Not ideally controlled -Current treatment  Trazodone $RemoveBe'50mg'HxFLurnKm$ , 0.5 (one-half) to 1 tablet at bedtime as needed for sleep - Appropriate, Effective, Safe, Accessible -Medications previously tried: melatonin (ineffective), Ambien (fell) -Counseled on practicing good sleep hygiene by setting a sleep schedule and maintaining it, avoid excessive napping, following a nightly routine, avoiding screen time for 30-60 minutes before going to bed, and making the bedroom a cool, quiet and dark space Recommended trying Tylenol before bed as she has pain at bedtime  Vitamin B12 deficiency (Goal: 211-911) -Controlled -Current treatment  Vitamin B12 (cyanocobalamin) 1000  units, 1 tablet once daily - Appropriate, Effective, Safe, Accessible -Medications previously tried: none  -Recommended repeat vitamin B12 level   Health Maintenance -Vaccine gaps: shingles, pneumovax, COVID booster -Current therapy:  Calcium 600 mg 1 tablet daily Potassium chloride 10 mEq 1 tablet twice daily Ferrous sulfate 324 mg once daily -Educated on Cost vs benefit of each product must be carefully weighed by individual  consumer -Patient is satisfied with current therapy and denies issues -Recommended to continue current medication  Patient Goals/Self-Care Activities Patient will:  - take medications as prescribed weigh daily, and contact provider if weight gain of > 3 lbs in one day or > 5 lbs in one week target a minimum of 150 minutes of moderate intensity exercise weekly  Follow Up Plan: Telephone follow up appointment with care management team member scheduled for: 5 months       Medication Assistance: None required.  Patient affirms current coverage meets needs.  Compliance/Adherence/Medication fill history: Care Gaps: Shingrix, COVID booster, eye exam, urine microalbumin, foot exam, Prevnar20 Last BP -  118/60 ( 12/04/21) Last A1C - 7.4 on 10/20/2021   Star-Rating Drugs: None  Patient's preferred pharmacy is:  [facility] - [place], [place] - [address] [place] [address] Phone: [phone] Fax: [phone]  [facility] [address] [place] [address] Phone: [phone] Fax: [place] [address], [place] [address] [place] [address] Phone: [phone] Fax: [phone]  [id number] [address], [place] [address] Phone: [phone] Fax: [phone]  Uses pill box? No - adherence packaging Pt endorses 99% compliance  We discussed: Benefits of medication synchronization, packaging and delivery as well as enhanced pharmacist oversight with [facility]. Patient decided to: Utilize [facility] pharmacy for medication synchronization, packaging and delivery  Care Plan and Follow Up Patient Decision:  Patient agrees to Care Plan and Follow-up.  Plan: Telephone follow up appointment with care management team member scheduled for:  4 months  Jeni Salles, PharmD [place]  Pharmacist [facility] [place] [phone]

## 2021-12-17 NOTE — Patient Instructions (Signed)
Hi Julie Norton,  It was great to catch up with you again! Don't forget to check your heart rate and blood pressure at home occasionally just to make sure you aren't having any higher heart rates or lower blood pressures you aren't aware of.  Also, don't forget to take your iron tablet with food to help minimize the side effects.  Please reach out to me if you have any questions or need anything before our follow up!  Best, Julie Norton  Julie Norton, PharmD, [facility]   Visit Information   Goals Addressed   None    Patient Care Plan: CCM Pharmacy Care Plan     Problem Identified: Problem: Hyperlipidemia, Diabetes, Atrial Fibrillation, Heart Failure, GERD, Hypothyroidism, Osteoarthritis and Vitamin B12 deficiency      Long-Range Goal: Patient-Specific Goal   Start Date: 11/13/2020  Expected End Date: 11/13/2021  Recent Progress: On track  Priority: High  Note:   Current Barriers:  Unable to independently afford treatment regimen Unable to independently monitor therapeutic efficacy  Pharmacist Clinical Goal(s):  Patient will achieve adherence to monitoring guidelines and medication adherence to achieve therapeutic efficacy through collaboration with PharmD and provider.   Interventions: 1:1 collaboration with Eulas Post, MD regarding development and update of comprehensive plan of care as evidenced by provider attestation and co-signature Inter-disciplinary care team collaboration (see longitudinal plan of care) Comprehensive medication review performed; medication list updated in electronic medical record  Hyperlipidemia: (LDL goal < 100) -Controlled -Current treatment: No medications -Medications previously tried: atorvastatin (muscle pain and low LDL)  -Current dietary patterns: did not discuss -Current exercise habits: limited -Educated on Cholesterol goals;  Exercise goal of 150 minutes per  week; -Counseled on diet and exercise extensively  Diabetes (A1c goal <8%) -Controlled -Current medications: [name] Flextouch inject 14-18 units once daily at 10 PM - Appropriate, Effective, Safe, Accessible -Medications previously tried: glimepiride, Invokana (yeast infections)  , metformin -Current home glucose readings fasting glucose: 105, 120s usually post prandial glucose: does not check -Denies hypoglycemic/hyperglycemic symptoms -Current meal patterns:  breakfast: did not discuss lunch: did not discuss dinner: did not discuss snacks: did not discuss drinks: did not discuss -Current exercise: limited -Educated on A1c and blood sugar goals; Exercise goal of 150 minutes per week; Benefits of routine self-monitoring of blood sugar; Carbohydrate counting and/or plate method -Counseled to check feet daily and get yearly eye exams -Counseled on diet and exercise extensively Recommended to continue current medication  Atrial Fibrillation (Goal: prevent stroke and major bleeding) -Controlled -CHADSVASC: 5 -Current treatment: Rate control: metoprolol succinate (Toprol XL) 25 mg, 1/2 tablet once daily - Appropriate, Effective, Safe, Accessible  Anticoagulation: apixaban (Eliquis) 2.5 mg, 1 tablet twice daily - Appropriate, Effective, Safe, Accessible -Medications previously tried: Tikosyn, Flecainide (nausea), amiodarone (side effects) -Home BP and HR readings: did not provide  -Counseled on importance of adherence to anticoagulant exactly as prescribed; avoidance of NSAIDs due to increased bleeding risk with anticoagulants; -Recommended to continue current medication  Heart Failure (Goal: manage symptoms and prevent exacerbations) -Controlled -Last ejection fraction: 50-55% (Date: 10/28/2017) -HF type: Diastolic -NYHA Class: II (slight limitation of activity) -AHA HF Stage: C (Heart disease and symptoms present) -Current treatment: Metoprolol succinate (Toprol XL) 25 mg,  1/2 tablet once daily - Appropriate, Effective, Safe, Accessible Torsemide 20 mg, 1 tablet once daily - Appropriate, Effective, Safe, Accessible  -Medications previously tried: none  -Current home BP/HR readings: did not provide -Current dietary habits: limits salt intake -  Current exercise habits: limited -Educated on Benefits of medications for managing symptoms and prolonging life Importance of weighing daily; if you gain more than 3 pounds in one day or 5 pounds in one week, call cardiologist -Counseled on diet and exercise extensively Recommended to continue current medication Patient is weighing herself daily  Hypothyroidism (Goal: TSH 0.35-4.5) -Controlled -Current treatment  Levothyroxine 50 mcg, 1 tablet once daily - Appropriate, Effective, Safe, Accessible  -Medications previously tried: none  -Recommended to continue current medication Counseled on administration of levothyroxine on an empty stomach, at least 30 minutes before first meal   GERD (Goal: minimize symptoms) -Controlled -Current treatment  Omeprazole 20 mg, 1 capsule once daily - Appropriate, Effective, Safe, Accessible  -Medications previously tried: none  -Recommended to continue current medication  Osteoarthritis (Goal: minimize symptoms) -Not ideally controlled -Current treatment  APAP 500 mg, 2 tablets every six hours as needed for mild pain or moderate pain - Appropriate, Effective, Safe, Accessible -Medications previously tried: cortisone shots (did not help)  -Recommended to continue current medication  Vitamin D deficiency (Goal: 30-100) -Controlled -Current treatment  Vitamin D3 5000 units, 1 capsule once daily - Appropriate, Effective, Safe, Accessible  -Medications previously tried: none  -Recommended repeat vitamin D level  Allergic rhinitis (Goal: minimize symptoms) -Controlled -Current treatment  Loratadine 10 mg, 1 tablet once daily as needed for allergies - Appropriate, Effective,  Safe, Accessible -Medications previously tried: none  -Recommended to continue current medication  Insomnia (Goal: improve quality and quantity of sleep) -Not ideally controlled -Current treatment  Trazodone '50mg'$ , 0.5 (one-half) to 1 tablet at bedtime as needed for sleep - Appropriate, Effective, Safe, Accessible -Medications previously tried: melatonin (ineffective), Ambien (fell) -Counseled on practicing good sleep hygiene by setting a sleep schedule and maintaining it, avoid excessive napping, following a nightly routine, avoiding screen time for 30-60 minutes before going to bed, and making the bedroom a cool, quiet and dark space Recommended trying Tylenol before bed as she has pain at bedtime  Vitamin B12 deficiency (Goal: 211-911) -Controlled -Current treatment  Vitamin B12 (cyanocobalamin) 1000 units, 1 tablet once daily - Appropriate, Effective, Safe, Accessible -Medications previously tried: none  -Recommended repeat vitamin B12 level   Health Maintenance -Vaccine gaps: shingles, pneumovax, COVID booster -Current therapy:  Calcium 600 mg 1 tablet daily Potassium chloride 10 mEq 1 tablet twice daily Ferrous sulfate 324 mg once daily -Educated on Cost vs benefit of each product must be carefully weighed by individual consumer -Patient is satisfied with current therapy and denies issues -Recommended to continue current medication  Patient Goals/Self-Care Activities Patient will:  - take medications as prescribed weigh daily, and contact provider if weight gain of > 3 lbs in one day or > 5 lbs in one week target a minimum of 150 minutes of moderate intensity exercise weekly  Follow Up Plan: Telephone follow up appointment with care management team member scheduled for: 5 months     Patient Care Plan: Plan of Care     Problem Identified: Quality of Life (General Plan of Care)      Goal: Quality of Life Maintained   Start Date: 11/13/2021  Expected End Date:  03/05/2022  This Visit's Progress: On track  Recent Progress: On track  Priority: High  Note:   Current Barriers:  Level of Care Concerns:Facility placement (SNF)  CSW Clinical Goal(s):  Patient  will verbalize basic understanding of Osteoporosis disease process and self health management plan    through collaboration with Clinical Social  Worker, provider, and care team.   Interventions: All hx provided by patient's adult children, Julie Norton and Julie Norton Patient was diagnosed with Shingles. Per Julie Norton and [place], pt endorses less discomfort with prescribed medication Family endorses improvement in pt's gait and mobility with current outpatient PT. She is expected to have approx 12 sessions in 8 weeks.  Patient has had a shower seat ordered to promote safety Family are interested in speaking with CCM Pharmacist to review pt's medications. LCSW collaborated with Pharmacist to schedule appt with family Pt has a strong support system  Inter-disciplinary care team collaboration (see longitudinal plan of care) Evaluation of current treatment plan related to  self management and patient's adherence to plan as established by provider Review resources, discussed options and provided patient information about  Enhanced Benefits connected with insurance provider:([facility]) [place] (PCS) :(Pt does not have MA) Facility placement process :Emailed supportive info Solution-Focused Strategies employed:  Active listening / Reflection utilized  Emotional Support Provided Caregiver stress acknowledged  Consideration of in-home help encouraged : options discussed Verbalization of feelings encouraged    Task & activities to accomplish goals: Review and utilize supportive information provided Contact PCP office with any questions or concerns Attend scheduled medical appointments        Patient verbalizes understanding of instructions and care plan provided today and agrees to view in [place].  Active MyChart status and patient understanding of how to access instructions and care plan via MyChart confirmed with patient.    Telephone follow up appointment with pharmacy team member scheduled for: 4 months  Viona Gilmore, [facility]

## 2021-12-23 ENCOUNTER — Encounter: Payer: Medicare Other | Admitting: Physical Therapy

## 2021-12-25 ENCOUNTER — Encounter: Payer: Medicare Other | Admitting: Physical Therapy

## 2021-12-25 DIAGNOSIS — H26492 Other secondary cataract, left eye: Secondary | ICD-10-CM | POA: Diagnosis not present

## 2021-12-26 ENCOUNTER — Encounter: Payer: Self-pay | Admitting: Physical Medicine and Rehabilitation

## 2021-12-26 ENCOUNTER — Encounter
Payer: Medicare Other | Attending: Physical Medicine and Rehabilitation | Admitting: Physical Medicine and Rehabilitation

## 2021-12-26 VITALS — BP 121/81 | HR 95 | Ht 61.0 in | Wt 111.8 lb

## 2021-12-26 DIAGNOSIS — B0229 Other postherpetic nervous system involvement: Secondary | ICD-10-CM | POA: Diagnosis not present

## 2021-12-26 DIAGNOSIS — M5136 Other intervertebral disc degeneration, lumbar region: Secondary | ICD-10-CM | POA: Diagnosis not present

## 2021-12-26 DIAGNOSIS — R269 Unspecified abnormalities of gait and mobility: Secondary | ICD-10-CM | POA: Diagnosis not present

## 2021-12-26 DIAGNOSIS — M436 Torticollis: Secondary | ICD-10-CM | POA: Diagnosis not present

## 2021-12-26 DIAGNOSIS — M5000 Cervical disc disorder with myelopathy, unspecified cervical region: Secondary | ICD-10-CM | POA: Diagnosis not present

## 2021-12-26 MED ORDER — LIDOCAINE 5 % EX PTCH: 3.0000 | MEDICATED_PATCH | CUTANEOUS | 5 refills | Status: AC

## 2021-12-29 NOTE — Progress Notes (Signed)
Call to patient per [facility] to advise that the Lidocaine Patches are on backorder with the pharmacy and can not be delivered to her on tomorrow as she requested. She [facility] [address]. [facility] was advised they are notified [facility] of where to send prescription. Patient aware and in agreement.  [place] Clinical Pharmacist Assistant [phone]

## 2021-12-30 ENCOUNTER — Encounter: Payer: Medicare Other | Admitting: Physical Therapy

## 2022-01-01 ENCOUNTER — Telehealth: Payer: Self-pay | Admitting: Pharmacist

## 2022-01-01 ENCOUNTER — Ambulatory Visit: Payer: Medicare Other | Admitting: Physical Therapy

## 2022-01-01 DIAGNOSIS — E039 Hypothyroidism, unspecified: Secondary | ICD-10-CM | POA: Diagnosis not present

## 2022-01-01 DIAGNOSIS — D6869 Other thrombophilia: Secondary | ICD-10-CM | POA: Diagnosis not present

## 2022-01-01 DIAGNOSIS — Z853 Personal history of malignant neoplasm of breast: Secondary | ICD-10-CM | POA: Diagnosis not present

## 2022-01-01 DIAGNOSIS — Z794 Long term (current) use of insulin: Secondary | ICD-10-CM | POA: Diagnosis not present

## 2022-01-01 DIAGNOSIS — E7800 Pure hypercholesterolemia, unspecified: Secondary | ICD-10-CM | POA: Diagnosis not present

## 2022-01-01 DIAGNOSIS — I340 Nonrheumatic mitral (valve) insufficiency: Secondary | ICD-10-CM | POA: Diagnosis not present

## 2022-01-01 DIAGNOSIS — I4819 Other persistent atrial fibrillation: Secondary | ICD-10-CM | POA: Diagnosis not present

## 2022-01-01 DIAGNOSIS — I4510 Unspecified right bundle-branch block: Secondary | ICD-10-CM | POA: Diagnosis not present

## 2022-01-01 DIAGNOSIS — M1990 Unspecified osteoarthritis, unspecified site: Secondary | ICD-10-CM | POA: Diagnosis not present

## 2022-01-01 DIAGNOSIS — D509 Iron deficiency anemia, unspecified: Secondary | ICD-10-CM | POA: Diagnosis not present

## 2022-01-01 DIAGNOSIS — M5136 Other intervertebral disc degeneration, lumbar region: Secondary | ICD-10-CM | POA: Diagnosis not present

## 2022-01-01 DIAGNOSIS — Z7901 Long term (current) use of anticoagulants: Secondary | ICD-10-CM | POA: Diagnosis not present

## 2022-01-01 DIAGNOSIS — Z9049 Acquired absence of other specified parts of digestive tract: Secondary | ICD-10-CM | POA: Diagnosis not present

## 2022-01-01 DIAGNOSIS — E1142 Type 2 diabetes mellitus with diabetic polyneuropathy: Secondary | ICD-10-CM | POA: Diagnosis not present

## 2022-01-01 DIAGNOSIS — Z9181 History of falling: Secondary | ICD-10-CM | POA: Diagnosis not present

## 2022-01-01 DIAGNOSIS — K219 Gastro-esophageal reflux disease without esophagitis: Secondary | ICD-10-CM | POA: Diagnosis not present

## 2022-01-01 DIAGNOSIS — R627 Adult failure to thrive: Secondary | ICD-10-CM | POA: Diagnosis not present

## 2022-01-01 NOTE — Chronic Care Management (AMB) (Addendum)
Chronic Care Management Pharmacy Assistant   Name: Julie Norton  MRN: [id number] DOB: [birth year]  Reason for Encounter: Medication Review Medication Coordination    Recent office visits:  None  Recent consult visits:  12/26/21 Julie Heys, MD - Patient presented for HNP with myelopathy cervical and other concerns. Changed Lidocaine 5% patches to 3 a day.  Hospital visits:  Medication Reconciliation was completed by comparing discharge summary, patient's EMR and Pharmacy list, and upon discussion with patient.   Patient presented to [facility] on 08/26/21 due to Persistent Atrial Fibrillation. Patient was present for 3 days.   New?Medications Started at [facility] Discharge:?? -started  dofetilide [name])   Medication Changes at Hospital Discharge: -Changed  none   Medications Discontinued at Hospital Discharge: -Stopped  zolpidem 5 MG tablet   Medications that remain the same after Hospital Discharge:??  -All other medications will remain the same.  Medications: Outpatient Encounter Medications as of 01/01/2022  Medication Sig   acetaminophen (TYLENOL) 500 MG tablet Take 1,000 mg by mouth every 6 (six) hours as needed for mild pain or moderate pain.    apixaban (ELIQUIS) 2.5 MG TABS tablet TAKE ONE TABLET BY MOUTH EVERY MORNING and TAKE ONE TABLET BY MOUTH EVERY EVENING   Cholecalciferol (VITAMIN D3) 125 MCG (5000 UT) CAPS TAKE ONE CAPSULE BY MOUTH EVERY MORNING   ferrous sulfate 325 (65 FE) MG EC tablet Take 325 mg daily   glucose blood (ONE TOUCH ULTRA TEST) test strip Check once daily. E11.40   insulin degludec (TRESIBA FLEXTOUCH) 100 UNIT/ML FlexTouch Pen Inject 24 units into THE SKIN ONCE daily AT 10pm (Patient taking differently: 14-18 Units in the morning.)   Insulin Pen Needle 29G X 5MM MISC Use once daily   levothyroxine (SYNTHROID) 50 MCG tablet TAKE ONE TABLET BY MOUTH BEFORE BREAKFAST   lidocaine (LIDODERM) 5 % Place 3 patches onto the  skin See admin instructions. Apply 3 patch to the neck /back for post herpatic neuralgia every day on for 12 hours; off for 12 hours.   loratadine (CLARITIN) 10 MG tablet Take 10 mg by mouth daily as needed for allergies.   MAGNESIUM PO Take 250 mg by mouth daily with breakfast.   metoprolol succinate (TOPROL-XL) 25 MG 24 hr tablet Take 0.5 tablets (12.5 mg total) by mouth every morning.   omeprazole (PRILOSEC) 20 MG capsule Take 1 capsule (20 mg total) by mouth as needed.   ONE TOUCH ULTRA TEST test strip CHECK ONCE A DAY   potassium chloride (KLOR-CON) 10 MEQ tablet Take 1 tablet (10 mEq total) by mouth 2 (two) times daily.   torsemide (DEMADEX) 20 MG tablet Take 1 tablet (20 mg total) by mouth daily as needed. Take daily prn weight gain or swelling   traZODone (DESYREL) 50 MG tablet Take 0.5-1 tablet by mouth at bedtime   vitamin B-12 (CYANOCOBALAMIN) 1000 MCG tablet Take 1 tablet (1,000 mcg total) by mouth daily.   No facility-administered encounter medications on file as of 01/01/2022.   Reviewed chart for medication changes ahead of medication coordination call.  BP Readings from Last 3 Encounters:  12/26/21 121/81  12/04/21 118/60  11/10/21 136/70    Lab Results  Component Value Date   HGBA1C 7.4 (H) 10/20/2021     Patient obtains medications through Adherence Packaging  30 Days   Last adherence delivery included: Eliquis 2.5 mg - take 1 tablet with breakfast and 1 tablet with dinner Levothyroxine 86mg - take 1 tablet  before breakfast Potassium Chloride 10 meq- take 1 tablet at breakfast and one tablet at dinner  Metoprolol ER '25mg'$  - take half tablet daily with breakfast     Tresiba FlexTouch U-100 insulin 100 unit/mL (3 mL) subcutaneous pen 11/27/2021 25    trazodone 50 mg tablet 11/28/2021 30    TORSEMIDE  20 MG TABS 09/17/2021 30    [facility]cl[facility] ( [facility])   Patient declined the following medications Tresiba Flextouch 100u - Inject 24 units once  daily ( just sent per pt)   Patient is due for next adherence delivery on: 01/14/22. Called patient and reviewed medications and coordinated delivery. Packs 30 DS  This delivery to include: Eliquis 2.5 mg - take 1 tablet with breakfast and 1 tablet with dinner Levothyroxine 57mg - take 1 tablet before breakfast Potassium Chloride 10 meq- take 1 tablet at breakfast and one tablet at dinner  Metoprolol ER '25mg'$  - take half tablet daily with breakfast Magnesium 250 mg - take 1 tablet at bedtime  Patient needs   Magnesium to be added in her bedtime pack  [facility] order per PT  Trazodone pt reports she has 24 pills left coordinated acute for delivery date of 7/23  TTyler Aaspt not needing to call when getting low  Confirmed delivery date of 01/14/22, advised patient that pharmacy will contact them the morning of delivery.   Care Gaps: [place] Vaccine - Overdue Foot Exam - Postponed Urine Micro - Postponed COVID Booster - Postponed PNA Vaccine - Postponed Lab Results  Component Value Date   HGBA1C 7.4 (H) 10/20/2021    Star Rating Drugs: None    LNed ClinesCWebbClinical Pharmacist Assistant [phone]

## 2022-01-02 ENCOUNTER — Telehealth: Payer: Self-pay

## 2022-01-02 DIAGNOSIS — M1990 Unspecified osteoarthritis, unspecified site: Secondary | ICD-10-CM

## 2022-01-02 DIAGNOSIS — E1159 Type 2 diabetes mellitus with other circulatory complications: Secondary | ICD-10-CM

## 2022-01-02 DIAGNOSIS — E039 Hypothyroidism, unspecified: Secondary | ICD-10-CM

## 2022-01-02 DIAGNOSIS — I4891 Unspecified atrial fibrillation: Secondary | ICD-10-CM | POA: Diagnosis not present

## 2022-01-02 DIAGNOSIS — I5030 Unspecified diastolic (congestive) heart failure: Secondary | ICD-10-CM | POA: Diagnosis not present

## 2022-01-02 DIAGNOSIS — Z7985 Long-term (current) use of injectable non-insulin antidiabetic drugs: Secondary | ICD-10-CM

## 2022-01-02 DIAGNOSIS — I110 Hypertensive heart disease with heart failure: Secondary | ICD-10-CM

## 2022-01-02 DIAGNOSIS — E785 Hyperlipidemia, unspecified: Secondary | ICD-10-CM | POA: Diagnosis not present

## 2022-01-02 MED ORDER — TRAZODONE HCL 50 MG PO TABS: ORAL_TABLET | ORAL | 1 refills | Status: AC

## 2022-01-02 NOTE — Telephone Encounter (Signed)
Rx sent 

## 2022-01-02 NOTE — Telephone Encounter (Signed)
-----   Message from Viona Gilmore, [facility] sent at 01/01/2022 11:15 AM EDT ----- Regarding: Trazodone refill Hi,  I know Dr. Sarajane Jews refilled the trazodone one time last month while Dr. Elease Hashimoto was out so I am sending this request to you instead. Dr. Elease Hashimoto was previously prescribing the trazodone though. Can you please send in a refill to [facility] for Ms. Snipe?  Thank you! Maddie

## 2022-01-02 NOTE — Addendum Note (Signed)
Addended by: Nilda Riggs on: 01/02/2022 01:18 PM   Modules accepted: Orders

## 2022-01-03 ENCOUNTER — Ambulatory Visit
Admission: RE | Admit: 2022-01-03 | Discharge: 2022-01-03 | Disposition: A | Discharge status: Home / Self Care | Payer: Medicare Other | Source: Ambulatory Visit | Attending: Physical Medicine and Rehabilitation | Admitting: Physical Medicine and Rehabilitation

## 2022-01-03 ENCOUNTER — Encounter: Payer: Self-pay | Admitting: Physical Medicine and Rehabilitation

## 2022-01-03 DIAGNOSIS — M5000 Cervical disc disorder with myelopathy, unspecified cervical region: Secondary | ICD-10-CM

## 2022-01-03 DIAGNOSIS — M4802 Spinal stenosis, cervical region: Secondary | ICD-10-CM | POA: Diagnosis not present

## 2022-01-03 DIAGNOSIS — M436 Torticollis: Secondary | ICD-10-CM

## 2022-01-07 NOTE — Telephone Encounter (Signed)
Ask IM if OK to prescribe Cymbalta for nerve pain. Let me know if can prescribe for leg Sx's.  Lidocaine patches is helpful for leg pain.  Also having chest Sx's of discomfort and difficulty breathing/coughing. Sneezing- will need to d/w  Dr Elease Hashimoto.  Also, will have Dr Letta Pate to see for Botox for cervical dystonia. Hers is severe- explained it will take awhile to get significant results.  But I think it's necessary- but will d/w Dr Letta Pate.    Can restart exercises with therapy based on MRI results.Let me know if want me to prescribe Cymbalta for nerve pain.

## 2022-01-09 ENCOUNTER — Ambulatory Visit (INDEPENDENT_AMBULATORY_CARE_PROVIDER_SITE_OTHER): Payer: Medicare Other

## 2022-01-09 ENCOUNTER — Ambulatory Visit (INDEPENDENT_AMBULATORY_CARE_PROVIDER_SITE_OTHER): Payer: Medicare Other | Admitting: Family Medicine

## 2022-01-09 ENCOUNTER — Encounter: Payer: Self-pay | Admitting: Family Medicine

## 2022-01-09 VITALS — BP 132/80 | HR 90 | Temp 97.8°F | Ht 61.0 in | Wt 109.2 lb

## 2022-01-09 DIAGNOSIS — R627 Adult failure to thrive: Secondary | ICD-10-CM

## 2022-01-09 DIAGNOSIS — E039 Hypothyroidism, unspecified: Secondary | ICD-10-CM | POA: Diagnosis not present

## 2022-01-09 DIAGNOSIS — E1165 Type 2 diabetes mellitus with hyperglycemia: Secondary | ICD-10-CM

## 2022-01-09 DIAGNOSIS — R634 Abnormal weight loss: Secondary | ICD-10-CM | POA: Diagnosis not present

## 2022-01-09 DIAGNOSIS — R0789 Other chest pain: Secondary | ICD-10-CM

## 2022-01-09 MED ORDER — FREESTYLE LIBRE 2 SENSOR MISC: 1.0000 | 5 refills | Status: AC

## 2022-01-09 MED ORDER — FREESTYLE LIBRE 2 READER DEVI: 1 refills | Status: AC

## 2022-01-09 NOTE — Progress Notes (Unsigned)
Established Patient Office Visit  Subjective   Patient ID: Julie Norton, female    DOB: Dec 11, 1929  Age: 88 y.o. MRN: [id number]  Chief Complaint  Patient presents with   Follow-up    Patient requests a Freestyle Libre to check glucose as she is having difficulty pressing buttons on the glucometer    HPI  {History (Optional):23778} Julie Norton is accompanied today by a neighbor friend who is helping to care for her currently.  She has become very debilitated over the past couple years particularly.  Progressive weight loss and generalized weakness.  She is battling with some cervical dystonia and torticollis and has seen physiatrist.  She has type 2 diabetes and last A1c was 7.4%.  This was less than 3 months ago.  She has been doing some Glucerna supplement but has lost another 10 pounds.  She has several questions regarding diet.  Might benefit from nutrition consult.  She had some intermittent substernal pains.  Denies any fall or injury to this region.  Symptoms can last for hours.  No dysphagia.  She did have recent shingles involving the left thoracic area but this pain seems different.  Her shingles rash is fully cleared.  She is concerned about some discoloration of both feet.  She has some purplish to bluish discoloration especially with dependency.  Has not had any recent lower extremity edema.  Has taken torsemide in the past but has not required recently.  Past Medical History:  Diagnosis Date   Abdominal adhesions    Abdominal gas pain    suspected -- may be related to intermittent right upper quadrant discomfort radiating around her back       Anemia    Anginal pain (HCC)    Arthritis    Atrial fibrillation (HCC)    Breast cancer (HCC)    Breast mass    left breast   Cancer (HCC)    left breast   Chest pain    intermittent right upper quadrant discomfort radiating around her back       Complication of anesthesia    Diabetes mellitus without complication  (HCC)    Type II   Dyspepsia    Dyspnea    GERD (gastroesophageal reflux disease)    H/O: hysterectomy 1978   History of cardioversion    Hypercholesteremia    Hypothyroidism    Mitral insufficiency    Personal history of radiation therapy    Pneumonia    PONV (postoperative nausea and vomiting)    Right bundle branch block (RBBB)    Tendinitis    of the right arm and shoulder   Torn tendon    right shoulder   Past Surgical History:  Procedure Laterality Date   ABDOMINAL HYSTERECTOMY     APPENDECTOMY  1978   BLADDER REPAIR     BREAST LUMPECTOMY Left 02/24/2011   central partial mastectomy - dr Margot Chimes   BREAST REDUCTION SURGERY     CARDIOVERSION N/A 10/28/2017   Procedure: CARDIOVERSION;  Surgeon: Lelon Perla, MD;  Location: [place];  Service: Cardiovascular;  Laterality: N/A;   CARDIOVERSION N/A 04/06/2019   Procedure: CARDIOVERSION;  Surgeon: [name], MD;  Location: [place];  Service: Cardiovascular;  Laterality: N/A;   CARDIOVERSION N/A 09/08/2019   Procedure: CARDIOVERSION;  Surgeon: Dorothy Spark, MD;  [facility] END[facility]COPY;  Service: Cardiovascular;  Laterality: N/A;   CARDIOVERSION N/A 08/28/2021   Procedure: CARDIOVERSION;  Surgeon: [name], MD;  Location: [facility] ENDOSCOPY;  Service: Cardiovascular;  Laterality: N/A;   COLONOSCOPY WITH PROPOFOL N/A 03/24/2017   Procedure: COLONOSCOPY WITH PROPOFOL;  Surgeon: Wilford Corner, MD;  Location: [place];  Service: Endoscopy;  Laterality: N/A;   ESOPHAGOGASTRODUODENOSCOPY (EGD) WITH PROPOFOL N/A 03/24/2017   Procedure: ESOPHAGOGASTRODUODENOSCOPY (EGD) WITH PROPOFOL;  Surgeon: Wilford Corner, MD;  Location: [place];  Service: Endoscopy;  Laterality: N/A;   LIPOMA EXCISION Right    REDUCTION MAMMAPLASTY Bilateral 1998   RETINAL DETACHMENT SURGERY Left    SALPINGOOPHORECTOMY     TEE WITHOUT CARDIOVERSION N/A 10/28/2017   Procedure: TRANSESOPHAGEAL ECHOCARDIOGRAM (TEE);   Surgeon: Lelon Perla, MD;  Location: [facility] ENDOSCOPY;  Service: Cardiovascular;  Laterality: N/A;   Julie Norton   at 88 years of age    reports that she has never smoked. She has never used smokeless tobacco. She reports that she does not drink alcohol and does not use drugs. family history includes Aortic aneurysm in her mother and sister; Cancer in her mother; Diabetes in her mother; Heart attack in her mother; Hypertension in her mother; Pneumonia in her father; Stroke in her mother. Allergies  Allergen Reactions   Bactrim Nausea And Vomiting   Dilaudid [Hydromorphone Hcl] Nausea Only   Hydrocodone Nausea And Vomiting   Invokana [Canagliflozin] Itching and Other (See Comments)    Yeast Infections    Macrodantin Nausea And Vomiting    Review of Systems  Constitutional:  Positive for malaise/fatigue and weight loss. Negative for chills and fever.  Respiratory:  Negative for cough and shortness of breath.   Cardiovascular:  Negative for chest pain.  Genitourinary:  Negative for dysuria.  Neurological:  Positive for weakness. Negative for dizziness.      Objective:     BP 132/80 (BP Location: Left Arm, Patient Position: Sitting, Cuff Size: Normal)   Pulse 90   Temp 97.8 F (36.6 C) (Oral)   Ht '5\' 1"'$  (1.549 m)   Wt 109 lb 3.2 oz (49.5 kg)   SpO2 95%   BMI 20.63 kg/m  BP Readings from Last 3 Encounters:  01/09/22 132/80  12/26/21 121/81  12/04/21 118/60   Wt Readings from Last 3 Encounters:  01/09/22 109 lb 3.2 oz (49.5 kg)  12/26/21 111 lb 12.8 oz (50.7 kg)  12/04/21 118 lb (53.5 kg)      Physical Exam Vitals reviewed.  Cardiovascular:     Rate and Rhythm: Normal rate.  Pulmonary:     Effort: Pulmonary effort is normal.     Breath sounds: Normal breath sounds.  Musculoskeletal:     Right lower leg: No edema.     Left lower leg: No edema.  Skin:    Comments: Has some bluish purplish discoloration of the skin especially in both  feet consistent with venous stasis.  Feet are warm to touch.  Good capillary refill.      No results found for any visits on 01/09/22.  {Labs (Optional):23779}  The ASCVD Risk score (Arnett DK, et al., 2019) failed to calculate for the following reasons:   The 2019 ASCVD risk score is only valid for ages 86 to 13    Assessment & Plan:   #1 type 2 diabetes.  Patient inquired regarding freestyle libre and we will try to get this ordered.  She is having difficulties with fingersticks because of her overall debility.  Most recent A1c 7.4%.  Recheck in 2 months at follow-up  #2 weight loss.  Currently only eating 2 meals per day.  Has tried some supplementation with Glucerna but still losing weight.  Set up referral to dietitian for further education  #3 anterior chest wall pain.  Denies any recent injury.  Obtain PA and lateral chest x-ray to further assess   Return in about 2 months (around 03/12/2022).    [name], MD

## 2022-01-09 NOTE — Patient Instructions (Signed)
I will set up referral to dietician to get some suggestions for healthy weight gain

## 2022-01-12 ENCOUNTER — Other Ambulatory Visit: Payer: Medicare Other

## 2022-01-19 ENCOUNTER — Telehealth: Payer: Self-pay | Admitting: Family Medicine

## 2022-01-19 ENCOUNTER — Encounter: Payer: Self-pay | Admitting: Family Medicine

## 2022-01-19 DIAGNOSIS — R634 Abnormal weight loss: Secondary | ICD-10-CM

## 2022-01-19 DIAGNOSIS — R627 Adult failure to thrive: Secondary | ICD-10-CM

## 2022-01-19 DIAGNOSIS — E1165 Type 2 diabetes mellitus with hyperglycemia: Secondary | ICD-10-CM

## 2022-01-19 MED ORDER — MIRTAZAPINE 7.5 MG PO TABS: 7.5000 mg | ORAL_TABLET | Freq: Every day | ORAL | 2 refills | Status: AC

## 2022-01-19 NOTE — Telephone Encounter (Signed)
Rx sent left vm for pt to return my call.

## 2022-01-19 NOTE — Telephone Encounter (Signed)
Pt aware rx sent.  

## 2022-01-19 NOTE — Telephone Encounter (Signed)
Pt friend leslie is calling and the pt would like to start on appetite stimulant . Pt would like to start with [facility] - [place], [place] - [address] Phone:  [phone]  Fax:  [phone]

## 2022-01-22 ENCOUNTER — Encounter: Payer: Self-pay | Admitting: Physical Medicine & Rehabilitation

## 2022-01-22 ENCOUNTER — Encounter: Payer: Medicare Other | Attending: Physical Medicine and Rehabilitation | Admitting: Physical Medicine & Rehabilitation

## 2022-01-22 VITALS — BP 151/82 | HR 99 | Ht 61.0 in | Wt 109.0 lb

## 2022-01-22 DIAGNOSIS — M436 Torticollis: Secondary | ICD-10-CM | POA: Diagnosis not present

## 2022-01-22 NOTE — Patient Instructions (Signed)
Onset of medication is 1-2 wks Duration of injection ~81moCan be repeated after 354mof good result

## 2022-01-22 NOTE — Progress Notes (Signed)
Botulinum toxin injection for cervical dystonia CPT code [phone] Diagnosis code G 24.3 Indication is cervical dystonia that has not responded to conservative care and interferes with activities of daily living as well as cervical range of motion. Chronic cervical pain not relieved by other treatments. Patient indicates a history of dropped head since January or February.  She has had some changes in her voice as well.  She was referred by Dr. Alice Rieger for botulinum toxin injections for the diagnosis of cervical dystonia.  Informed consent was obtained after describing risks and benefits of the procedure with the patient this included bleeding bruising and infection The patient elects to proceed and has given Written consent.  REMS form completed  Patient placed in a seated position A 27-gauge 1 inch needle electrode was used to guide the injection under EMG guidance.  Muscles and dosing: Right splen cap 30U 1+ Right levator scap 20U 0-1+ Left Levator scap 10U 0-1+ Waste 40U  All injections done after negative drawback for blood and with EMG guidance.. Patient tolerated procedure well. Post procedure instructions given. Follow up appointment made  Patient will follow-up with Dr. Alice Rieger in approximately 6 weeks to see if the injection was effective.  We started out with a low-dose, given limited active typically seen with EMG.  As well as body habitus

## 2022-01-23 ENCOUNTER — Telehealth: Payer: Self-pay

## 2022-01-23 NOTE — Telephone Encounter (Signed)
Pt and caregiver informed of the message and verbalized understanding

## 2022-01-23 NOTE — Telephone Encounter (Signed)
caller states she goes to the patient's house this morning her oxygen was 79% and HR was 105 she moved her to a chair and it came up to 93% and she ate. going back down to 88% quickly now it is up, but she is groggy  01/23/2022 11:02:51 AM Go to ED Now (or PCP triage) [place], RN, Estill Bamberg  Comments User: Rozelle Logan, RN Date/Time Julie Norton Time): 01/23/2022 10:51:34 AM was seen yesterday and got injections in her neck for "head drop" says it was botox oxygen registered 90%  User: Rozelle Logan, RN Date/Time Julie Norton Time): 01/23/2022 10:53:18 AM patient was able to answer no to chest pain but states she feels different.  User: Rozelle Logan, RN Date/Time Julie Norton Time): 01/23/2022 10:55:07 AM right now it is 93%-94% sitting in a chair  User: Rozelle Logan, RN Date/Time Julie Norton Time): 01/23/2022 10:57:38 AM had ablation in feb  User: Rozelle Logan, RN Date/Time Julie Norton Time): 01/23/2022 10:59:42 AM patient still answering questions.  User: Rozelle Logan, RN Date/Time Julie Norton Time): 01/23/2022 11:11:13 AM speaking with patient again. says she would prefer walk in. then asked if another provider has an availability today. transferred back to office  Referrals [place] UNDECIDED  Pt was scheduled an appt with Dr Sarajane Jews on 01/26/22

## 2022-01-26 ENCOUNTER — Encounter: Payer: Self-pay | Admitting: Family Medicine

## 2022-01-26 ENCOUNTER — Ambulatory Visit (INDEPENDENT_AMBULATORY_CARE_PROVIDER_SITE_OTHER): Payer: Medicare Other | Admitting: Family Medicine

## 2022-01-26 VITALS — BP 102/62 | HR 79 | Temp 98.6°F | Wt 108.2 lb

## 2022-01-26 DIAGNOSIS — R110 Nausea: Secondary | ICD-10-CM

## 2022-01-26 DIAGNOSIS — I4819 Other persistent atrial fibrillation: Secondary | ICD-10-CM

## 2022-01-26 DIAGNOSIS — M436 Torticollis: Secondary | ICD-10-CM | POA: Diagnosis not present

## 2022-01-26 DIAGNOSIS — K219 Gastro-esophageal reflux disease without esophagitis: Secondary | ICD-10-CM | POA: Diagnosis not present

## 2022-01-26 DIAGNOSIS — E039 Hypothyroidism, unspecified: Secondary | ICD-10-CM | POA: Diagnosis not present

## 2022-01-26 DIAGNOSIS — E1165 Type 2 diabetes mellitus with hyperglycemia: Secondary | ICD-10-CM | POA: Diagnosis not present

## 2022-01-26 DIAGNOSIS — R300 Dysuria: Secondary | ICD-10-CM

## 2022-01-26 DIAGNOSIS — D509 Iron deficiency anemia, unspecified: Secondary | ICD-10-CM

## 2022-01-26 DIAGNOSIS — R5383 Other fatigue: Secondary | ICD-10-CM

## 2022-01-26 LAB — POC URINALSYSI DIPSTICK (AUTOMATED)
Blood, UA: NEGATIVE
Glucose, UA: POSITIVE — AB
Ketones, UA: NEGATIVE
Leukocytes, UA: NEGATIVE
Nitrite, UA: NEGATIVE
Protein, UA: POSITIVE — AB
Spec Grav, UA: >=1.03 — AB (ref 1.010–1.025)
Urobilinogen, UA: 1 E.U./dL
pH, UA: 6 (ref 5.0–8.0)

## 2022-01-26 LAB — POCT GLUCOSE (DEVICE FOR HOME USE): Glucose Fasting, POC: 220 mg/dL — AB (ref 70–99)

## 2022-01-26 MED ORDER — OMEPRAZOLE 20 MG PO CPDR: 20.0000 mg | DELAYED_RELEASE_CAPSULE | Freq: Two times a day (BID) | ORAL | Status: DC

## 2022-01-26 MED ORDER — METOPROLOL SUCCINATE ER 25 MG PO TB24
12.5000 mg | ORAL_TABLET | Freq: Two times a day (BID) | ORAL | 3 refills | Status: DC
Start: 2022-01-26 — End: 2022-02-05

## 2022-01-26 NOTE — Progress Notes (Signed)
   Subjective:    Patient ID: Julie Norton, female    DOB: Dec 01, 1929, 88 y.o.   MRN: [id number]  HPI Here for 5 months of feeling very fatigued and somewhat nauseated. Now for the past week she has been hoarse as well. No fever or  ST or cough or SOB. No vomiting. She has some mild epigastric pains off and on, but she takes Omeprazole BID. Her BMs are regular (one every other day). She has no urinary urgency or burning, but she describes her urine as feeling "warm" as it comes out. No recent medication changes. She has been getting some Botox shots to her neck to treat torticollis. Her random BS today is 220. Her thyroid levels were last checked one year ago.   Review of Systems  Constitutional:  Positive for fatigue. Negative for fever.  HENT: Negative.    Respiratory: Negative.    Cardiovascular: Negative.   Gastrointestinal:  Positive for nausea. Negative for abdominal distention, abdominal pain, blood in stool, constipation, diarrhea, rectal pain and vomiting.  Genitourinary: Negative.        Objective:   Physical Exam Constitutional:      Comments: She is quite frail, walks slowly with a walker   HENT:     Right Ear: Tympanic membrane, ear canal and external ear normal.     Left Ear: Tympanic membrane, ear canal and external ear normal.     Nose: Nose normal.     Mouth/Throat:     Pharynx: Oropharynx is clear.     Comments: Her voice is hoarse and quiet  Eyes:     Conjunctiva/sclera: Conjunctivae normal.     Pupils: Pupils are equal, round, and reactive to light.  Cardiovascular:     Rate and Rhythm: Normal rate and regular rhythm.     Pulses: Normal pulses.     Heart sounds: Normal heart sounds.  Pulmonary:     Effort: Pulmonary effort is normal.     Breath sounds: Normal breath sounds.  Abdominal:     General: Abdomen is flat. Bowel sounds are normal. There is no distension.     Palpations: Abdomen is soft. There is no mass.     Tenderness: There is no abdominal  tenderness. There is no right CVA tenderness, left CVA tenderness, guarding or rebound.     Hernia: No hernia is present.  Musculoskeletal:     Right lower leg: No edema.     Left lower leg: No edema.  Neurological:     General: No focal deficit present.     Mental Status: She is alert and oriented to person, place, and time.           Assessment & Plan:  She has had chronic fatigue and now some hoarseness. The etiology is not clear although some of her diagnoses such as hypothyroidism and anemia could account for her symptoms.  Alysia Penna, MD

## 2022-01-26 NOTE — Addendum Note (Signed)
Addended by: Wyvonne Lenz on: 01/26/2022 04:15 PM   Modules accepted: Orders

## 2022-01-26 NOTE — Addendum Note (Signed)
Addended by: Wyvonne Lenz on: 01/26/2022 04:13 PM   Modules accepted: Orders

## 2022-01-27 LAB — HEPATIC FUNCTION PANEL
ALT: 21 U/L (ref 0–35)
AST: 21 U/L (ref 0–37)
Albumin: 3.8 g/dL (ref 3.5–5.2)
Alkaline Phosphatase: 49 U/L (ref 39–117)
Bilirubin, Direct: 0.1 mg/dL (ref 0.0–0.3)
Total Bilirubin: 0.6 mg/dL (ref 0.2–1.2)
Total Protein: 6 g/dL (ref 6.0–8.3)

## 2022-01-27 LAB — BASIC METABOLIC PANEL
BUN: 17 mg/dL (ref 6–23)
CO2: 34 mEq/L — ABNORMAL HIGH (ref 19–32)
Calcium: 9.7 mg/dL (ref 8.4–10.5)
Chloride: 101 mEq/L (ref 96–112)
Creatinine, Ser: 0.66 mg/dL (ref 0.40–1.20)
GFR: 77.56 mL/min (ref >60.00)
Glucose, Bld: 179 mg/dL — ABNORMAL HIGH (ref 70–99)
Potassium: 4.5 mEq/L (ref 3.5–5.1)
Sodium: 139 mEq/L (ref 135–145)

## 2022-01-27 LAB — URINE CULTURE
MICRO NUMBER:: 13685648
SPECIMEN QUALITY:: ADEQUATE

## 2022-01-27 LAB — CBC WITH DIFFERENTIAL/PLATELET
Basophils Absolute: 0 10*3/uL (ref 0.0–0.1)
Basophils Relative: 0.5 % (ref 0.0–3.0)
Eosinophils Absolute: 0 10*3/uL (ref 0.0–0.7)
Eosinophils Relative: 0.6 % (ref 0.0–5.0)
HCT: 42.4 % (ref 36.0–46.0)
Hemoglobin: 14.3 g/dL (ref 12.0–15.0)
Lymphocytes Relative: 20.1 % (ref 12.0–46.0)
Lymphs Abs: 1.2 10*3/uL (ref 0.7–4.0)
MCHC: 33.8 g/dL (ref 30.0–36.0)
MCV: 92 fl (ref 78.0–100.0)
Monocytes Absolute: 0.5 10*3/uL (ref 0.1–1.0)
Monocytes Relative: 9.3 % (ref 3.0–12.0)
Neutro Abs: 4 10*3/uL (ref 1.4–7.7)
Neutrophils Relative %: 69.5 % (ref 43.0–77.0)
Platelets: 128 10*3/uL — ABNORMAL LOW (ref 150.0–400.0)
RBC: 4.61 Mil/uL (ref 3.87–5.11)
RDW: 14.8 % (ref 11.5–15.5)
WBC: 5.8 10*3/uL (ref 4.0–10.5)

## 2022-01-27 LAB — IBC + FERRITIN
Ferritin: 18.4 ng/mL (ref 10.0–291.0)
Iron: 218 ug/dL — ABNORMAL HIGH (ref 42–145)
Saturation Ratios: 56.4 % — ABNORMAL HIGH (ref 20.0–50.0)
TIBC: 386.4 ug/dL (ref 250.0–450.0)
Transferrin: 276 mg/dL (ref 212.0–360.0)

## 2022-01-27 LAB — HEMOGLOBIN A1C: Hgb A1c MFr Bld: 7.3 % — ABNORMAL HIGH (ref 4.6–6.5)

## 2022-01-27 LAB — T4, FREE: Free T4: 1.09 ng/dL (ref 0.60–1.60)

## 2022-01-27 LAB — T3, FREE: T3, Free: 2.3 pg/mL (ref 2.3–4.2)

## 2022-01-27 LAB — TSH: TSH: 1.53 u[IU]/mL (ref 0.35–5.50)

## 2022-01-28 NOTE — Progress Notes (Signed)
Cardiology Office Note   Date:  02/02/2022   ID:  Julie Norton, DOB [birth year], MRN [id number]  PCP:  Eulas Post, MD  Cardiologist: Yasmyn Bellisario Martinique MD  Chief Complaint  Patient presents with   Congestive Heart Failure        History of Present Illness: Julie Norton is a 88 y.o. female who presents for follow up of Atrial fibrillation.   She has a past history of hypercholesterolemia. She also has a history of DM on  insulin. Echo in 2015 showed mild MR with normal LV function. She had a normal Myoview study in November 2017.   She was seen by Dr. Elease Hashimoto on 09/20/17. Found to be in Afib with rate 95. Started on Eliquis. Follow up Echo showed mild biatrial enlargement and normal LV function.  She subsequently underwent TEE guided DCCV on 10/28/17 with return to NSR.   Was seen in early September 2020 with  increased leg swelling for 2 months. Noted SOB going up stairs. Was found to be in Afib with rate 105. Toprol XL was added for rate control. Lasix was increased.  We did try her on Flecainide but this resulted in severe nausea and diarrhea and was discontinued.   She was seen in the ED on 03/27/19 with complaints of epigastric pain/abdominal pain. Mildly elevated lipase. Other labs OK. Ecg in Afib with rate 80. CXR and CT abdomen were normal.  She subsequently underwent successful DCCV on 04/06/19. Seen back on October 27 and was in NSR. Dr Burchette's note of Dec 18 thought she was back in Afib but no Ecg done. Rate controlled. When seen in our office she was in Afib.  She was started on amiodarone  at 400 mg daily. She underwent repeat DCCV on September 08, 2018 successfully. Due to some nausea amiodarone was reduced to 200 mg daily.    She did develop side effects on amiodarone and this was discontinued in December 2021. Had symptoms despite reduction in dose to 100 mg daily. She reports feeling much better afterwards.  Repeat Echo showed normal LV function. There was  increased RA size and TR. BNP was 180. UA negative for proteinuria.    When seen in August 2022 she was doing well and was still in NSR with long first degree AV block. Rate 75.   When seen in December she was found to have recurrent Afib. She noted significant increase in weight, swelling, and SOB. Some chest tightness. She was seen in the Afib clinic.  Patient was loaded on dofetilide 2/21-2/24/23 with DCCV on 08/28/21.   She has lost an additional 17 lbs since March. 34 lbs since Dec.  Has difficulty lifting head up. Seeing Ortho and is in a cervical collar. Has severe osteoarthritis but no cord compression in MRI.  Recent botox injection. Has some SOB with walking. CXR done July 7 showed no acute disease. Recent labs unremarkable except A1c 7.3%. She was seen in EP in May. Patient thought Tikosyn was making her feel bad so she stopped taking it. Still feels very weak.      Past Medical History:  Diagnosis Date   Abdominal adhesions    Abdominal gas pain    suspected -- may be related to intermittent right upper quadrant discomfort radiating around her back       Anemia    Anginal pain (HCC)    Arthritis    Atrial fibrillation ([place])    Breast cancer ([place])  Breast mass    left breast   Cancer (HCC)    left breast   Chest pain    intermittent right upper quadrant discomfort radiating around her back       Complication of anesthesia    Diabetes mellitus without complication (HCC)    Type II   Dyspepsia    Dyspnea    GERD (gastroesophageal reflux disease)    H/O: hysterectomy 1978   History of cardioversion    Hypercholesteremia    Hypothyroidism    Mitral insufficiency    Personal history of radiation therapy    Pneumonia    PONV (postoperative nausea and vomiting)    Right bundle branch block (RBBB)    Tendinitis    of the right arm and shoulder   Torn tendon    right shoulder    Past Surgical History:  Procedure Laterality Date   ABDOMINAL HYSTERECTOMY      APPENDECTOMY  1978   BLADDER REPAIR     BREAST LUMPECTOMY Left 02/24/2011   central partial mastectomy - dr Margot Chimes   BREAST REDUCTION SURGERY     CARDIOVERSION N/A 10/28/2017   Procedure: CARDIOVERSION;  Surgeon: Lelon Perla, MD;  Location: [place];  Service: Cardiovascular;  Laterality: N/A;   CARDIOVERSION N/A 04/06/2019   Procedure: CARDIOVERSION;  Surgeon: [name], MD;  Location[facility] ENDOSCOPY;  Service: Cardiovascular;  Laterality: N/A;   CARDIOVERSION N/A 09/08/2019   Procedure: CARDIOVERSION;  Surgeon: Dorothy Spark, MD;  Location: [facility] ENDOSCOPY;  Service: Cardiovascular;  Laterality: N/A;   CARDIOVERSION N/A 08/28/2021   Procedure: CARDIOVERSION;  Surgeon: [name], MD;  Location: [place];  Service: Cardiovascular;  Laterality: N/A;   COLONOSCOPY WITH PROPOFOL N/A 03/24/2017   Procedure: COLONOSCOPY WITH PROPOFOL;  Surgeon: Wilford Corner, MD;  Location: [facility] ENDOSCOPY;  Service: Endoscopy;  Laterality: N/A;   ESOPHAGOGASTRODUODENOSCOPY (EGD) WITH PROPOFOL N/A 03/24/2017   Procedure: ESOPHAGOGASTRODUODENOSCOPY (EGD) WITH PROPOFOL;  Surgeon: Wilford Corner, MD;  Location: [place];  Service: Endoscopy;  Laterality: N/A;   LIPOMA EXCISION Right    REDUCTION MAMMAPLASTY Bilateral 1998   RETINAL DETACHMENT SURGERY Left    SALPINGOOPHORECTOMY     TEE WITHOUT CARDIOVERSION N/A 10/28/2017   Procedure: TRANSESOPHAGEAL ECHOCARDIOGRAM (TEE);  Surgeon: Lelon Perla, MD;  Location: [facility] ENDOSCOPY;  Service: Cardiovascular;  Laterality: N/A;   [place]   at 88 years of age     Current Outpatient Medications  Medication Sig Dispense Refill   acetaminophen (TYLENOL) 500 MG tablet Take 1,000 mg by mouth every 6 (six) hours as needed for mild pain or moderate pain.      apixaban (ELIQUIS) 2.5 MG TABS tablet TAKE ONE TABLET BY MOUTH EVERY MORNING and TAKE ONE TABLET BY MOUTH EVERY EVENING 180 tablet 2    Cholecalciferol 125 MCG (5000 UT) capsule Take 1 capsule by mouth every morning.     Continuous Blood Gluc Receiver (FREESTYLE LIBRE 2 READER) DEVI Use as directed 1 each 1   Continuous Blood Gluc Sensor (FREESTYLE LIBRE 2 SENSOR) MISC 1 each by Does not apply route as directed. 2 each 5   glucose blood (ONE TOUCH ULTRA TEST) test strip Check once daily. E11.40 100 each 11   insulin degludec (TRESIBA FLEXTOUCH) 100 UNIT/ML FlexTouch Pen Inject 24 units into THE SKIN ONCE daily AT 10pm (Patient taking differently: 12 Units in the morning.) 15 mL 3   Insulin Pen Needle 29G X 5MM MISC Use once daily 100 each 3  levothyroxine (SYNTHROID) 50 MCG tablet TAKE ONE TABLET BY MOUTH BEFORE BREAKFAST 90 tablet 1   lidocaine (LIDODERM) 5 % Place 3 patches onto the skin See admin instructions. Apply 3 patch to the neck /back for post herpatic neuralgia every day on for 12 hours; off for 12 hours. 90 patch 5   loratadine (CLARITIN) 10 MG tablet Take 10 mg by mouth daily as needed for allergies.     MAGNESIUM PO Take 250 mg by mouth daily with breakfast.     metoprolol succinate (TOPROL-XL) 25 MG 24 hr tablet Take 0.5 tablets (12.5 mg total) by mouth in the morning and at bedtime. 45 tablet 3   omeprazole (PRILOSEC) 20 MG capsule Take 1 capsule (20 mg total) by mouth 2 (two) times daily before a meal.     ONE TOUCH ULTRA TEST test strip CHECK ONCE A DAY 50 each 3   potassium chloride (KLOR-CON) 10 MEQ tablet Take 1 tablet (10 mEq total) by mouth 2 (two) times daily. 270 tablet 1   torsemide (DEMADEX) 20 MG tablet Take 1 tablet (20 mg total) by mouth daily as needed. Take daily prn weight gain or swelling 90 tablet 3   traZODone (DESYREL) 50 MG tablet Take 0.5-1 tablet by mouth at bedtime 90 tablet 1   vitamin B-12 (CYANOCOBALAMIN) 1000 MCG tablet Take 1 tablet (1,000 mcg total) by mouth daily. 90 tablet 3   ferrous sulfate 325 (65 FE) MG EC tablet Take 325 mg daily (Patient not taking: Reported on 02/02/2022)  3    mirtazapine (REMERON) 7.5 MG tablet Take 1 tablet (7.5 mg total) by mouth at bedtime. (Patient not taking: Reported on 02/02/2022) 30 tablet 2   tiZANidine (ZANAFLEX) 2 MG tablet Take 1 tablet by mouth daily. (Patient not taking: Reported on 02/02/2022)     No current facility-administered medications for this visit.    Allergies:   Bactrim, Dilaudid [hydromorphone hcl], Hydrocodone, Invokana [canagliflozin], and Macrodantin    Social History:  The patient  reports that she has never smoked. She has never used smokeless tobacco. She reports that she does not drink alcohol and does not use drugs.   Family History:  The patient's family history includes Aortic aneurysm in her mother and sister; Cancer in her mother; Diabetes in her mother; Heart attack in her mother; Hypertension in her mother; Pneumonia in her father; Stroke in her mother.    ROS:  Please see the history of present illness.   Otherwise, review of systems are positive for none.   All other systems are reviewed and negative.    PHYSICAL EXAM: VS:  BP (!) 141/78   Pulse 83   Ht '5\' 1"'$  (1.549 m)   Wt 107 lb 6.4 oz (48.7 kg)   SpO2 94%   BMI 20.29 kg/m  , BMI Body mass index is 20.29 kg/m. GENERAL:  Well appearing elderly WF  In NAD HEENT:  PERRL, EOMI, sclera are clear. Oropharynx is clear. NECK:  No jugular venous distention, carotid upstroke brisk and symmetric, no bruits, no thyromegaly or adenopathy LUNGS:  Bibasilar rales.  CHEST:  Unremarkable HEART: RRR,  PMI not displaced or sustained,S1 and S2 within normal limits, no S3, no S4: no clicks, no rubs, there is a soft 1-2/6 SEM ABD:  Soft, nontender. BS +, no masses or bruits. No hepatomegaly, no splenomegaly EXT:  2 + pulses throughout, no  edema, no cyanosis no clubbing SKIN:  Warm and dry.  No rashes NEURO:  Alert and  oriented x 3. Cranial nerves II through XII intact. PSYCH:  Cognitively intact  EKG:  EKG is  ordered today. NSR with first degree AV block. Rate  83. RBBB. I have personally reviewed and interpreted this study.   Recent Labs: 06/13/2021: BNP 185.5 11/05/2021: Magnesium 1.9 01/26/2022: ALT 21; BUN 17; Creatinine, Ser 0.66; Hemoglobin 14.3; Platelets 128.0; Potassium 4.5; Sodium 139; TSH 1.53    Lipid Panel    Component Value Date/Time   CHOL 94 06/23/2019 0825   CHOL 135 04/26/2017 0840   TRIG 70.0 06/23/2019 0825   HDL 35.20 (L) 06/23/2019 0825   HDL 45 04/26/2017 0840   CHOLHDL 3 06/23/2019 0825   VLDL 14.0 06/23/2019 0825   LDLCALC 45 06/23/2019 0825   LDLCALC 70 04/26/2017 0840      Wt Readings from Last 3 Encounters:  02/02/22 107 lb 6.4 oz (48.7 kg)  01/26/22 108 lb 4 oz (49.1 kg)  01/22/22 109 lb (49.4 kg)      Lexiscan myoview 05/26/16:Study Highlights   Nuclear stress EF: 72%. There was no ST segment deviation noted during stress. This is a low risk study. The left ventricular ejection fraction is hyperdynamic (>65%).   1. Small, mild mid-anteroseptal perfusion defect.  This defect was actually more intense at rest than with stress.  No ischemia.  Suspect attenuation.  2. EF 72% with normal wall motion.  3. Overall low risk study.      Echo 09/24/17: Study Conclusions   - Left ventricle: The cavity size was normal. Systolic function was   normal. The estimated ejection fraction was in the range of 55%   to 60%. Wall motion was normal; there were no regional wall   motion abnormalities. - Mitral valve: There was mild regurgitation. - Left atrium: The atrium was mildly dilated. - Right atrium: The atrium was mildly dilated. - Atrial septum: No defect or patent foramen ovale was identified. - Pulmonary arteries: Systolic pressure was mildly increased. PA   peak pressure: 37 mm Hg (S).    Echo 06/24/20: IMPRESSIONS     1. Left ventricular ejection fraction, by estimation, is 60 to 65%. The  left ventricle has normal function. The left ventricle has no regional  wall motion abnormalities. Left  ventricular diastolic parameters are  consistent with Grade II diastolic  dysfunction (pseudonormalization).   2. Right ventricular systolic function is mildly reduced. The right  ventricular size is mildly enlarged. There is normal pulmonary artery  systolic pressure.   3. Right atrial size was severely dilated.   4. The mitral valve is normal in structure. Trivial mitral valve  regurgitation.   5. Tricuspid valve regurgitation is moderate.   6. The aortic valve is tricuspid. There is mild thickening of the aortic  valve. Aortic valve regurgitation is not visualized. No aortic stenosis is  present.   7. Aortic dilatation noted. There is mild dilatation of the aortic root,  measuring 36 mm. There is mild dilatation of the ascending aorta,  measuring 37 mm.   8. The inferior vena cava is dilated in size with <50% respiratory  variability, suggesting right atrial pressure of 15 mmHg.   Comparison(s): Compared to prior study in 2019, there is now moderate TR.  Otherwise there is no significant change   ASSESSMENT AND PLAN:  1. Atrial fibrillation. Recurrent. [place] vasc score of 4. On Eliquis. Rate controlled on Toprol XL.  S/p TEE guided DCCV in April 2019. Repeat DCCV on April 06, 2019 with ERAD. She  was intolerant of Flecainide. She was symptomatic with her Afib so was started on amiodarone. S/p  successful repeat DCCV on September 08, 2019.  Amiodarone was discontinued in December 2021 due to side effects. She now has recurrent Afib. S/p Tikosyn load and DCCV in Feb 2023. Last Ecg May 3 she was still in NSR. No off Tikosyn  and still in NSR. If Afib does recur looks like we'll just need to manage with rate control.   2.  Chronic diastolic CHF.  Echo 2021 was unremarkable except for moderate TR. Weight is down 34 lbs since Dec. Off diuretics. Sodium restriction. Will reduce potassium to 20 me  3. Hypercholesterolemia. On low dose lipitor.   4. Diabetes mellitus- now on insulin.    5.  History of Breast CA.   6. RBBB and first degree AV block   I will follow up in 6 months.  Signed, Karolee Meloni Martinique MD, [facility]   02/02/2022 1:38 PM    [facility]

## 2022-02-02 ENCOUNTER — Encounter: Payer: Self-pay | Admitting: Cardiology

## 2022-02-02 ENCOUNTER — Ambulatory Visit: Payer: Medicare Other | Admitting: Cardiology

## 2022-02-02 VITALS — BP 141/78 | HR 83 | Ht 61.0 in | Wt 107.4 lb

## 2022-02-02 DIAGNOSIS — I480 Paroxysmal atrial fibrillation: Secondary | ICD-10-CM

## 2022-02-02 DIAGNOSIS — I4510 Unspecified right bundle-branch block: Secondary | ICD-10-CM

## 2022-02-02 DIAGNOSIS — I5032 Chronic diastolic (congestive) heart failure: Secondary | ICD-10-CM

## 2022-02-03 ENCOUNTER — Telehealth: Payer: Self-pay | Admitting: Pharmacist

## 2022-02-03 ENCOUNTER — Encounter: Payer: Self-pay | Admitting: Family Medicine

## 2022-02-03 NOTE — Telephone Encounter (Signed)
No, neither of these should make her feel tired. As for the iron level, I would decrease her iron pills to one every other day. Dr. Elease Hashimoto can recheck these in 3 months

## 2022-02-03 NOTE — Progress Notes (Unsigned)
Chronic Care Management Pharmacy Assistant   Name: Julie Norton  MRN: [id number] DOB: 12-03-1929  Reason for Encounter: Medication Review Medication Coordination  Recent office visits:  01/26/22 Julie Morale, MD - Patient presented for Other fatigue and other concerns. Changed Metoprolol Changed Omeprazole.  Stopped Dofetilide. Stopped Metformin.   01/09/22 Julie Norton, Julie Sierras, MD - Patient presented for Anterior chest wall pain and other concerns. No medication changes.  Recent consult visits:  02/02/22 Norton, Julie M, MD (Cardiology) - Patient presented for Paroxysmal Atrial Fibrillation and other concerns. Stopped Zolpidem Tartrate.  01/22/22 Kirsteins, Julie Salk, MD ([place]) - Patient presented for Torticollis. Botox injection administered. No medication changes.  01/03/22 Patient presented to [facility] for MR Cervical spine [facility] visits:  Medication Reconciliation was completed by comparing discharge summary, patient's EMR and Pharmacy list, and upon discussion with patient.   Patient presented to [facility] on 08/26/21 due to Persistent Atrial Fibrillation. Patient was present for 3 days.   New?Medications Started at [facility] Discharge:?? -started  dofetilide Julie Norton)   Medication Changes at Hospital Discharge: -Changed  none   Medications Discontinued at Hospital Discharge: -Stopped  zolpidem 5 MG tablet   Medications that remain the same after Hospital Discharge:??  -All other medications will remain the same.  Medications: Outpatient Encounter Medications as of 02/03/2022  Medication Sig Note   acetaminophen (TYLENOL) 500 MG tablet Take 1,000 mg by mouth every 6 (six) hours as needed for mild pain or moderate pain.     apixaban (ELIQUIS) 2.5 MG TABS tablet TAKE ONE TABLET BY MOUTH EVERY MORNING and TAKE ONE TABLET BY MOUTH EVERY EVENING    Cholecalciferol 125 MCG (5000 UT) capsule Take 1 capsule by mouth  every morning.    Continuous Blood Gluc Receiver (FREESTYLE LIBRE 2 READER) DEVI Use as directed    Continuous Blood Gluc Sensor (FREESTYLE LIBRE 2 SENSOR) MISC 1 each by Does not apply route as directed.    ferrous sulfate 325 (65 FE) MG EC tablet Take 325 mg daily (Patient not taking: Reported on 02/02/2022)    glucose blood (ONE TOUCH ULTRA TEST) test strip Check once daily. E11.40    insulin degludec (TRESIBA FLEXTOUCH) 100 UNIT/ML FlexTouch Pen Inject 24 units into THE SKIN ONCE daily AT 10pm (Patient taking differently: 12 Units in the morning.)    Insulin Pen Needle 29G X 5MM MISC Use once daily    levothyroxine (SYNTHROID) 50 MCG tablet TAKE ONE TABLET BY MOUTH BEFORE BREAKFAST    lidocaine (LIDODERM) 5 % Place 3 patches onto the skin See admin instructions. Apply 3 patch to the neck /back for post herpatic neuralgia every day on for 12 hours; off for 12 hours.    loratadine (CLARITIN) 10 MG tablet Take 10 mg by mouth daily as needed for allergies.    MAGNESIUM PO Take 250 mg by mouth daily with breakfast.    metoprolol succinate (TOPROL-XL) 25 MG 24 hr tablet Take 0.5 tablets (12.5 mg total) by mouth in the morning and at bedtime. 02/02/2022: 12.5 MG once at day.   mirtazapine (REMERON) 7.5 MG tablet Take 1 tablet (7.5 mg total) by mouth at bedtime. (Patient not taking: Reported on 02/02/2022)    omeprazole (PRILOSEC) 20 MG capsule Take 1 capsule (20 mg total) by mouth 2 (two) times daily before a meal.    ONE TOUCH ULTRA TEST test strip CHECK ONCE A DAY    potassium  chloride (KLOR-CON) 10 MEQ tablet Take 1 tablet (10 mEq total) by mouth 2 (two) times daily.    tiZANidine (ZANAFLEX) 2 MG tablet Take 1 tablet by mouth daily. (Patient not taking: Reported on 02/02/2022)    torsemide (DEMADEX) 20 MG tablet Take 1 tablet (20 mg total) by mouth daily as needed. Take daily prn weight gain or swelling    traZODone (DESYREL) 50 MG tablet Take 0.5-1 tablet by mouth at bedtime 02/02/2022: AS NEEDED.    vitamin B-12 (CYANOCOBALAMIN) 1000 MCG tablet Take 1 tablet (1,000 mcg total) by mouth daily.    No facility-administered encounter medications on file as of 02/03/2022.  Reviewed chart for medication changes ahead of medication coordination call.  BP Readings from Last 3 Encounters:  02/02/22 (!) 141/78  01/26/22 102/62  01/22/22 (!) 151/82    Lab Results  Component Value Date   HGBA1C 7.3 (H) 01/26/2022     Patient obtains medications through Adherence Packaging  30 Days   Last adherence delivery included:  Eliquis 2.5 mg - take 1 tablet with breakfast and 1 tablet with dinner Levothyroxine 1mg - take 1 tablet before breakfast Potassium Chloride 10 meq- take 1 tablet at breakfast and one tablet at dinner  Metoprolol ER '25mg'$  - take half tablet daily with breakfast Magnesium 250 mg - take 1 tablet at bedtime   Patient needs   Magnesium to be added in her bedtime pack   Lidocaine 5% transferred to [facility] from [facility] due to back order per PT   Trazodone pt reports she has 24 pills left coordinated acute for delivery date of 7/23   Julie Norton not needing to call when getting low   Confirmed delivery date of 01/14/22, advised patient that pharmacy will contact them the morning of delivery.    Patient is due for next adherence delivery on: 02/13/22. Called patient and reviewed medications and coordinated delivery. Pack 30 DS   This delivery to include: Eliquis 2.5 mg - take 1 tablet with breakfast and 1 tablet with dinner Levothyroxine 577m - take 1 tablet before breakfast Potassium Chloride 10 meq- take 1 tablet at breakfast and one tablet at dinner  Metoprolol ER 12.5 mg - take half tablet daily with breakfast and dinner Trazodone 50 mg PRN 1/2 to one tab at bedtime (vial) Freestyle LiLehman BrothersPatient declined the following medications (meds) due to (reason) Mirtazipine 7.5 mg Take one at Bedtime - pt reports not taking did not like how it makes her feel  Patient  needs refills for; Omeprazole : Take one capsule at breakfast and dinner  Confirmed delivery date of 02/13/22, advised patient that pharmacy will contact them the morning of delivery.   Care Gaps: Flu Vaccine - Overdue Zoster Vaccine - Postponed Foot Exam - Postponed Urine Micro - Postponed COVID Booster - Postponed PNA Vaccine - Postponed CCM- 8/23 AWV- 3/23 Lab Results  Component Value Date   HGBA1C 7.3 (H) 01/26/2022    Star Rating Drugs: None   LaNed ClinesMGranbylinical Pharmacist Assistant [phone]

## 2022-02-05 ENCOUNTER — Other Ambulatory Visit: Payer: Self-pay | Admitting: Cardiology

## 2022-02-05 MED ORDER — METOPROLOL SUCCINATE ER 25 MG PO TB24: 12.5000 mg | ORAL_TABLET | Freq: Every day | ORAL | 3 refills | Status: AC

## 2022-02-09 ENCOUNTER — Other Ambulatory Visit: Payer: Self-pay | Admitting: Family Medicine

## 2022-02-09 ENCOUNTER — Ambulatory Visit: Payer: Medicare Other | Admitting: Physician Assistant

## 2022-02-09 DIAGNOSIS — E0590 Thyrotoxicosis, unspecified without thyrotoxic crisis or storm: Secondary | ICD-10-CM

## 2022-02-10 ENCOUNTER — Inpatient Hospital Stay (HOSPITAL_COMMUNITY)
Admission: EM | Admit: 2022-02-10 | Discharge: 2022-03-06 | DRG: 177 | Disposition: E | Payer: Medicare Other | Attending: Internal Medicine | Admitting: Internal Medicine

## 2022-02-10 ENCOUNTER — Other Ambulatory Visit: Payer: Self-pay

## 2022-02-10 ENCOUNTER — Emergency Department (HOSPITAL_COMMUNITY): Payer: Medicare Other

## 2022-02-10 DIAGNOSIS — G9349 Other encephalopathy: Secondary | ICD-10-CM | POA: Diagnosis not present

## 2022-02-10 DIAGNOSIS — D696 Thrombocytopenia, unspecified: Secondary | ICD-10-CM | POA: Diagnosis present

## 2022-02-10 DIAGNOSIS — Z8249 Family history of ischemic heart disease and other diseases of the circulatory system: Secondary | ICD-10-CM

## 2022-02-10 DIAGNOSIS — E7800 Pure hypercholesterolemia, unspecified: Secondary | ICD-10-CM | POA: Diagnosis present

## 2022-02-10 DIAGNOSIS — H9190 Unspecified hearing loss, unspecified ear: Secondary | ICD-10-CM | POA: Diagnosis present

## 2022-02-10 DIAGNOSIS — J690 Pneumonitis due to inhalation of food and vomit: Secondary | ICD-10-CM | POA: Diagnosis not present

## 2022-02-10 DIAGNOSIS — I4819 Other persistent atrial fibrillation: Secondary | ICD-10-CM | POA: Diagnosis present

## 2022-02-10 DIAGNOSIS — J9622 Acute and chronic respiratory failure with hypercapnia: Secondary | ICD-10-CM | POA: Diagnosis not present

## 2022-02-10 DIAGNOSIS — R079 Chest pain, unspecified: Secondary | ICD-10-CM | POA: Diagnosis not present

## 2022-02-10 DIAGNOSIS — R6889 Other general symptoms and signs: Secondary | ICD-10-CM | POA: Diagnosis not present

## 2022-02-10 DIAGNOSIS — Z923 Personal history of irradiation: Secondary | ICD-10-CM

## 2022-02-10 DIAGNOSIS — Y842 Radiological procedure and radiotherapy as the cause of abnormal reaction of the patient, or of later complication, without mention of misadventure at the time of the procedure: Secondary | ICD-10-CM | POA: Diagnosis present

## 2022-02-10 DIAGNOSIS — I4510 Unspecified right bundle-branch block: Secondary | ICD-10-CM | POA: Diagnosis present

## 2022-02-10 DIAGNOSIS — I4892 Unspecified atrial flutter: Secondary | ICD-10-CM | POA: Diagnosis present

## 2022-02-10 DIAGNOSIS — J9621 Acute and chronic respiratory failure with hypoxia: Secondary | ICD-10-CM | POA: Diagnosis present

## 2022-02-10 DIAGNOSIS — K219 Gastro-esophageal reflux disease without esophagitis: Secondary | ICD-10-CM | POA: Diagnosis not present

## 2022-02-10 DIAGNOSIS — R54 Age-related physical debility: Secondary | ICD-10-CM | POA: Diagnosis present

## 2022-02-10 DIAGNOSIS — Z515 Encounter for palliative care: Secondary | ICD-10-CM

## 2022-02-10 DIAGNOSIS — M436 Torticollis: Secondary | ICD-10-CM | POA: Diagnosis present

## 2022-02-10 DIAGNOSIS — E870 Hyperosmolality and hypernatremia: Secondary | ICD-10-CM | POA: Diagnosis not present

## 2022-02-10 DIAGNOSIS — R1013 Epigastric pain: Secondary | ICD-10-CM | POA: Diagnosis not present

## 2022-02-10 DIAGNOSIS — J9602 Acute respiratory failure with hypercapnia: Secondary | ICD-10-CM | POA: Diagnosis present

## 2022-02-10 DIAGNOSIS — L8990 Pressure ulcer of unspecified site, unspecified stage: Secondary | ICD-10-CM | POA: Insufficient documentation

## 2022-02-10 DIAGNOSIS — Z90710 Acquired absence of both cervix and uterus: Secondary | ICD-10-CM

## 2022-02-10 DIAGNOSIS — Z833 Family history of diabetes mellitus: Secondary | ICD-10-CM

## 2022-02-10 DIAGNOSIS — I700 Atherosclerosis of aorta: Secondary | ICD-10-CM | POA: Diagnosis not present

## 2022-02-10 DIAGNOSIS — I4430 Unspecified atrioventricular block: Secondary | ICD-10-CM | POA: Diagnosis present

## 2022-02-10 DIAGNOSIS — Z7901 Long term (current) use of anticoagulants: Secondary | ICD-10-CM

## 2022-02-10 DIAGNOSIS — J189 Pneumonia, unspecified organism: Secondary | ICD-10-CM | POA: Diagnosis present

## 2022-02-10 DIAGNOSIS — Z8052 Family history of malignant neoplasm of bladder: Secondary | ICD-10-CM

## 2022-02-10 DIAGNOSIS — J9601 Acute respiratory failure with hypoxia: Secondary | ICD-10-CM | POA: Diagnosis present

## 2022-02-10 DIAGNOSIS — M1990 Unspecified osteoarthritis, unspecified site: Secondary | ICD-10-CM | POA: Diagnosis not present

## 2022-02-10 DIAGNOSIS — Z7189 Other specified counseling: Secondary | ICD-10-CM | POA: Diagnosis not present

## 2022-02-10 DIAGNOSIS — Z882 Allergy status to sulfonamides status: Secondary | ICD-10-CM

## 2022-02-10 DIAGNOSIS — R0602 Shortness of breath: Secondary | ICD-10-CM | POA: Diagnosis not present

## 2022-02-10 DIAGNOSIS — Z888 Allergy status to other drugs, medicaments and biological substances status: Secondary | ICD-10-CM

## 2022-02-10 DIAGNOSIS — Z66 Do not resuscitate: Secondary | ICD-10-CM | POA: Diagnosis not present

## 2022-02-10 DIAGNOSIS — Z794 Long term (current) use of insulin: Secondary | ICD-10-CM

## 2022-02-10 DIAGNOSIS — R519 Headache, unspecified: Secondary | ICD-10-CM | POA: Diagnosis not present

## 2022-02-10 DIAGNOSIS — L89151 Pressure ulcer of sacral region, stage 1: Secondary | ICD-10-CM | POA: Diagnosis present

## 2022-02-10 DIAGNOSIS — Z681 Body mass index (BMI) 19 or less, adult: Secondary | ICD-10-CM

## 2022-02-10 DIAGNOSIS — E1165 Type 2 diabetes mellitus with hyperglycemia: Secondary | ICD-10-CM | POA: Diagnosis not present

## 2022-02-10 DIAGNOSIS — R410 Disorientation, unspecified: Secondary | ICD-10-CM | POA: Diagnosis not present

## 2022-02-10 DIAGNOSIS — R64 Cachexia: Secondary | ICD-10-CM | POA: Diagnosis not present

## 2022-02-10 DIAGNOSIS — L89611 Pressure ulcer of right heel, stage 1: Secondary | ICD-10-CM | POA: Diagnosis not present

## 2022-02-10 DIAGNOSIS — Z20822 Contact with and (suspected) exposure to covid-19: Secondary | ICD-10-CM | POA: Diagnosis present

## 2022-02-10 DIAGNOSIS — Z9221 Personal history of antineoplastic chemotherapy: Secondary | ICD-10-CM

## 2022-02-10 DIAGNOSIS — E039 Hypothyroidism, unspecified: Secondary | ICD-10-CM | POA: Diagnosis not present

## 2022-02-10 DIAGNOSIS — R000 Tachycardia, unspecified: Secondary | ICD-10-CM | POA: Diagnosis not present

## 2022-02-10 DIAGNOSIS — Z9049 Acquired absence of other specified parts of digestive tract: Secondary | ICD-10-CM

## 2022-02-10 DIAGNOSIS — K760 Fatty (change of) liver, not elsewhere classified: Secondary | ICD-10-CM | POA: Diagnosis not present

## 2022-02-10 DIAGNOSIS — R627 Adult failure to thrive: Secondary | ICD-10-CM | POA: Diagnosis present

## 2022-02-10 DIAGNOSIS — Z823 Family history of stroke: Secondary | ICD-10-CM

## 2022-02-10 DIAGNOSIS — Z853 Personal history of malignant neoplasm of breast: Secondary | ICD-10-CM

## 2022-02-10 DIAGNOSIS — I499 Cardiac arrhythmia, unspecified: Secondary | ICD-10-CM | POA: Diagnosis not present

## 2022-02-10 DIAGNOSIS — Z881 Allergy status to other antibiotic agents status: Secondary | ICD-10-CM

## 2022-02-10 DIAGNOSIS — Z885 Allergy status to narcotic agent status: Secondary | ICD-10-CM

## 2022-02-10 DIAGNOSIS — Z79890 Hormone replacement therapy (postmenopausal): Secondary | ICD-10-CM

## 2022-02-10 DIAGNOSIS — R109 Unspecified abdominal pain: Secondary | ICD-10-CM | POA: Diagnosis present

## 2022-02-10 DIAGNOSIS — Z79899 Other long term (current) drug therapy: Secondary | ICD-10-CM

## 2022-02-10 DIAGNOSIS — I340 Nonrheumatic mitral (valve) insufficiency: Secondary | ICD-10-CM | POA: Diagnosis present

## 2022-02-10 DIAGNOSIS — L89621 Pressure ulcer of left heel, stage 1: Secondary | ICD-10-CM | POA: Diagnosis present

## 2022-02-10 DIAGNOSIS — R9431 Abnormal electrocardiogram [ECG] [EKG]: Secondary | ICD-10-CM | POA: Diagnosis not present

## 2022-02-10 DIAGNOSIS — R1312 Dysphagia, oropharyngeal phase: Secondary | ICD-10-CM | POA: Diagnosis not present

## 2022-02-10 DIAGNOSIS — Z743 Need for continuous supervision: Secondary | ICD-10-CM | POA: Diagnosis not present

## 2022-02-10 DIAGNOSIS — R52 Pain, unspecified: Secondary | ICD-10-CM | POA: Diagnosis not present

## 2022-02-10 DIAGNOSIS — K224 Dyskinesia of esophagus: Secondary | ICD-10-CM | POA: Diagnosis present

## 2022-02-10 DIAGNOSIS — Z952 Presence of prosthetic heart valve: Secondary | ICD-10-CM

## 2022-02-10 LAB — COMPREHENSIVE METABOLIC PANEL
ALT: 17 U/L (ref 0–44)
AST: 21 U/L (ref 15–41)
Albumin: 3.6 g/dL (ref 3.5–5.0)
Alkaline Phosphatase: 53 U/L (ref 38–126)
Anion gap: 7 (ref 5–15)
BUN: 18 mg/dL (ref 8–23)
CO2: 32 mmol/L (ref 22–32)
Calcium: 10.1 mg/dL (ref 8.9–10.3)
Chloride: 102 mmol/L (ref 98–111)
Creatinine, Ser: 0.6 mg/dL (ref 0.44–1.00)
GFR, Estimated: >60 mL/min (ref >60)
Glucose, Bld: 171 mg/dL — ABNORMAL HIGH (ref 70–99)
Potassium: 4.3 mmol/L (ref 3.5–5.1)
Sodium: 141 mmol/L (ref 135–145)
Total Bilirubin: 0.9 mg/dL (ref 0.3–1.2)
Total Protein: 6.1 g/dL — ABNORMAL LOW (ref 6.5–8.1)

## 2022-02-10 LAB — CBC WITH DIFFERENTIAL/PLATELET
Abs Immature Granulocytes: 0.01 10*3/uL (ref 0.00–0.07)
Basophils Absolute: 0 10*3/uL (ref 0.0–0.1)
Basophils Relative: 0 %
Eosinophils Absolute: 0 10*3/uL (ref 0.0–0.5)
Eosinophils Relative: 1 %
HCT: 47.1 % — ABNORMAL HIGH (ref 36.0–46.0)
Hemoglobin: 16 g/dL — ABNORMAL HIGH (ref 12.0–15.0)
Immature Granulocytes: 0 %
Lymphocytes Relative: 16 %
Lymphs Abs: 1.2 10*3/uL (ref 0.7–4.0)
MCH: 32.1 pg (ref 26.0–34.0)
MCHC: 34 g/dL (ref 30.0–36.0)
MCV: 94.4 fL (ref 80.0–100.0)
Monocytes Absolute: 0.7 10*3/uL (ref 0.1–1.0)
Monocytes Relative: 9 %
Neutro Abs: 5.5 10*3/uL (ref 1.7–7.7)
Neutrophils Relative %: 74 %
Platelets: 129 10*3/uL — ABNORMAL LOW (ref 150–400)
RBC: 4.99 MIL/uL (ref 3.87–5.11)
RDW: 13.9 % (ref 11.5–15.5)
WBC: 7.5 10*3/uL (ref 4.0–10.5)
nRBC: 0 % (ref 0.0–0.2)

## 2022-02-10 LAB — LIPASE, BLOOD: Lipase: 39 U/L (ref 11–51)

## 2022-02-10 MED ORDER — SODIUM CHLORIDE 0.9 % IV BOLUS
500.0000 mL | Freq: Once | INTRAVENOUS | Status: AC
  Administered 2022-02-10: 500 mL via INTRAVENOUS

## 2022-02-10 MED ORDER — FENTANYL CITRATE PF 50 MCG/ML IJ SOSY
50.0000 ug | PREFILLED_SYRINGE | Freq: Once | INTRAMUSCULAR | Status: AC
  Administered 2022-02-10: 50 ug via INTRAVENOUS
  Filled 2022-02-10: qty 1

## 2022-02-10 MED ORDER — ONDANSETRON HCL 4 MG/2ML IJ SOLN
4.0000 mg | Freq: Once | INTRAMUSCULAR | Status: AC
  Administered 2022-02-10: 4 mg via INTRAVENOUS
  Filled 2022-02-10: qty 2

## 2022-02-10 MED ORDER — PANTOPRAZOLE SODIUM 40 MG IV SOLR
40.0000 mg | Freq: Once | INTRAVENOUS | Status: AC
  Administered 2022-02-10: 40 mg via INTRAVENOUS
  Filled 2022-02-10: qty 10

## 2022-02-10 MED ORDER — ONDANSETRON HCL 4 MG/2ML IJ SOLN
INTRAMUSCULAR | Status: AC
  Filled 2022-02-10: qty 2

## 2022-02-10 MED ORDER — SUCRALFATE 1 G PO TABS: 1.0000 g | ORAL_TABLET | Freq: Two times a day (BID) | ORAL | 0 refills | Status: AC

## 2022-02-10 MED ORDER — IOHEXOL 300 MG/ML  SOLN
100.0000 mL | Freq: Once | INTRAMUSCULAR | Status: AC | PRN
  Administered 2022-02-10: 100 mL via INTRAVENOUS

## 2022-02-10 MED ORDER — PANTOPRAZOLE SODIUM 20 MG PO TBEC: 20.0000 mg | DELAYED_RELEASE_TABLET | Freq: Every day | ORAL | 0 refills | Status: AC

## 2022-02-10 NOTE — ED Notes (Signed)
Patient transported to CT 

## 2022-02-10 NOTE — Discharge Instructions (Signed)
[facility] today to be evaluated for your abdominal pain.  The CT scan of your abdomen pelvis did not show any acute abnormalities.  Your lab results were reassuring.  Due to your pain I have started you on medications Protonix and Carafate.  Please take these as prescribed.  Please follow-up with your primary care doctor for repeat assessment.  If you continue to have abdominal plain please follow-up with [facility] gastroenterology

## 2022-02-10 NOTE — ED Notes (Signed)
Patient returned to room from CT. 

## 2022-02-10 NOTE — ED Triage Notes (Signed)
Pt BIB [facility] from home c/o ongoing intermittent epigastric abdominal pain x2 weeks. Pain worsens with eating. Associated with increased weakness, nausea, and SOB. Endorses weight loss.

## 2022-02-10 NOTE — ED Provider Notes (Signed)
[facility] EMERGENCY DEPARTMENT Provider Note   CSN: [id number] Arrival date & time: 02/03/2022  1928     History  Chief Complaint  Patient presents with   Abdominal Pain    Julie Norton is a 88 y.o. female with a history of anemia, atrial fibrillation (on Eliquis), diabetes mellitus, status post appendectomy, status post abdominal hysterectomy, GERD.  Presents the emergency department complaint of epigastric abdominal pain.  Patient reports that she has been having epigastric abdominal pain over the last 3 weeks.  Patient reports that she will wake in the morning with the pain present and it gradually resolves by lunchtime.  Patient states that she does have worsening of pain and reproduction of pain with any p.o. intake.  Patient states that today the pain has remained constant and has not improved.  Patient reports that she has had nausea, fatigue, chills, and constipation over the last 3 weeks as well.  Patient's last bowel movement was 2 days prior.  She also reports that she passed a small amount of stool this morning.  Patient has been passing flatus without difficulty.  Patient also reports that she has been having shortness of breath of the last 3 weeks related to her epigastric abdominal pain.  Patient also reports that she has had a 35 pound weight loss since December.  Patient was drinking boost drinks and having increased protein intake over the last few months.  Patient reports that she stopped doing this approximately 1 week prior.  Patient denies any fever, diarrhea, blood in stool, melena, dysuria, hematuria, urinary urgency, urinary frequency, vaginal pain, vaginal bleeding, vaginal discharge, lightheadedness, syncope.   Abdominal Pain Associated symptoms: chills, nausea and shortness of breath   Associated symptoms: no chest pain, no cough, no diarrhea, no dysuria, no fever, no hematuria, no vaginal bleeding, no vaginal discharge and no vomiting         Home Medications Prior to Admission medications   Medication Sig Start Date End Date Taking? Authorizing Provider  acetaminophen (TYLENOL) 500 MG tablet Take 1,000 mg by mouth every 6 (six) hours as needed for mild pain or moderate pain.     [provider]  apixaban (ELIQUIS) 2.5 MG TABS tablet TAKE ONE TABLET BY MOUTH EVERY MORNING and TAKE ONE TABLET BY MOUTH EVERY EVENING 11/06/21   Baldwin Jamaica, PA-C  Cholecalciferol 125 MCG (5000 UT) capsule Take 1 capsule by mouth every morning.    [provider]  Continuous Blood Gluc Receiver (FREESTYLE LIBRE 2 READER) DEVI Use as directed 01/09/22   Burchette, Alinda Sierras, MD  Continuous Blood Gluc Sensor (FREESTYLE LIBRE 2 SENSOR) MISC 1 each by Does not apply route as directed. 01/09/22   Burchette, Alinda Sierras, MD  ferrous sulfate 325 (65 FE) MG EC tablet Take 325 mg daily Patient not taking: Reported on 02/02/2022 06/16/21   Martinique, Zoria Rawlinson M, MD  glucose blood (ONE TOUCH ULTRA TEST) test strip Check once daily. E11.40 03/20/15   Burchette, Alinda Sierras, MD  insulin degludec (TRESIBA FLEXTOUCH) 100 UNIT/ML FlexTouch Pen Inject 24 units into THE SKIN ONCE daily AT 10pm Patient taking differently: 12 Units in the morning. 04/07/21   Burchette, Alinda Sierras, MD  Insulin Pen Needle 29G X 5MM MISC Use once daily 04/07/17   Eulas Post, MD  levothyroxine (SYNTHROID) 50 MCG tablet TAKE ONE TABLET BY MOUTH BEFORE BREAKFAST 02/09/22   Burchette, Alinda Sierras, MD  lidocaine (LIDODERM) 5 % Place 3 patches onto the skin  See admin instructions. Apply 3 patch to the neck /back for post herpatic neuralgia every day on for 12 hours; off for 12 hours. 12/26/21   Lovorn, Jinny Blossom, MD  loratadine (CLARITIN) 10 MG tablet Take 10 mg by mouth daily as needed for allergies.    [provider]  MAGNESIUM PO Take 250 mg by mouth daily with breakfast.    [provider]  metoprolol succinate (TOPROL-XL) 25 MG 24 hr tablet Take 0.5 tablets (12.5 mg total) by  mouth daily. 02/05/22   Martinique, Dolores Ewing M, MD  mirtazapine (REMERON) 7.5 MG tablet Take 1 tablet (7.5 mg total) by mouth at bedtime. Patient not taking: Reported on 02/02/2022 01/19/22   Eulas Post, MD  omeprazole (PRILOSEC) 20 MG capsule Take 1 capsule (20 mg total) by mouth 2 (two) times daily before a meal. 01/26/22   Laurey Morale, MD  ONE [facility] ULTRA TEST test strip CHECK ONCE A DAY 02/26/14   Burchette, Alinda Sierras, MD  potassium chloride (KLOR-CON) 10 MEQ tablet Take 1 tablet (10 mEq total) by mouth 2 (two) times daily. 09/16/21   [place]ique, Piper Hassebrock M, MD  tiZANidine (ZANAFLEX) 2 MG tablet Take 1 tablet by mouth daily. Patient not taking: Reported on 02/02/2022    [provider]  torsemide (DEMADEX) 20 MG tablet Take 1 tablet (20 mg total) by mouth daily as needed. Take daily prn weight gain or swelling 09/16/21 09/11/22  [place], Marcelis Wissner M, MD  traZODone (DESYREL) 50 MG tablet Take 0.5-1 tablet by mouth at bedtime 01/02/22   Burchette, Alinda Sierras, MD  vitamin B-12 (CYANOCOBALAMIN) 1000 MCG tablet Take 1 tablet (1,000 mcg total) by mouth daily. 05/28/20   Burchette, Alinda Sierras, MD      Allergies    Bactrim, Dilaudid [hydromorphone hcl], Hydrocodone, Invokana [canagliflozin], and Macrodantin    Review of Systems   Review of Systems  Constitutional:  Positive for chills. Negative for fever.  Eyes:  Negative for visual disturbance.  Respiratory:  Positive for shortness of breath. Negative for cough.   Cardiovascular:  Negative for chest pain.  Gastrointestinal:  Positive for abdominal pain and nausea. Negative for abdominal distention, anal bleeding, blood in stool, diarrhea, rectal pain and vomiting.  Genitourinary:  Negative for difficulty urinating, dysuria, flank pain, frequency, hematuria, urgency, vaginal bleeding, vaginal discharge and vaginal pain.  Musculoskeletal:  Negative for back pain and neck pain.  Skin:  Negative for color change and rash.  Neurological:  Negative for dizziness,  syncope, light-headedness and headaches.  Psychiatric/Behavioral:  Negative for confusion.     Physical Exam Updated Vital Signs BP (!) 156/103   Pulse (!) 57   Temp 97.7 F (36.5 C) (Oral)   Resp 17   Ht $R'5\' 1"'ND$  (1.549 m)   Wt 46.3 kg   SpO2 95%   BMI 19.27 kg/m  Physical Exam Vitals and nursing note reviewed.  Constitutional:      General: She is not in acute distress.    Appearance: She is underweight. She is ill-appearing. She is not toxic-appearing or diaphoretic.     Comments: Chronically ill-appearing  HENT:     Head: Normocephalic.  Eyes:     General: No scleral icterus.       Right eye: No discharge.        Left eye: No discharge.  Cardiovascular:     Rate and Rhythm: Normal rate.  Pulmonary:     Effort: Pulmonary effort is normal. No tachypnea, bradypnea or respiratory distress.  Breath sounds: Normal breath sounds. No stridor.  Abdominal:     General: Abdomen is flat. Bowel sounds are normal. There is no distension. There are no signs of injury.     Palpations: Abdomen is soft. There is no mass or pulsatile mass.     Tenderness: There is abdominal tenderness in the epigastric area. There is no right CVA tenderness, left CVA tenderness, guarding or rebound.  Skin:    General: Skin is warm and dry.  Neurological:     General: No focal deficit present.     Mental Status: She is alert.  Psychiatric:        Behavior: Behavior is cooperative.     ED Results / Procedures / Treatments   Labs (all labs ordered are listed, but only abnormal results are displayed) Labs Reviewed  COMPREHENSIVE METABOLIC PANEL - Abnormal; Notable for the following components:      Result Value   Glucose, Bld 171 (*)    Total Protein 6.1 (*)    All other components within normal limits  CBC WITH DIFFERENTIAL/PLATELET - Abnormal; Notable for the following components:   Hemoglobin 16.0 (*)    HCT 47.1 (*)    Platelets 129 (*)    All other components within normal limits   LIPASE, BLOOD  URINALYSIS, ROUTINE W REFLEX MICROSCOPIC    EKG EKG Interpretation  Date/Time:  Tuesday February 10 2022 19:40:04 EDT Ventricular Rate:  81 PR Interval:    QRS Duration: 138 QT Interval:  402 QTC Calculation: 467 R Axis:   2 Text Interpretation: Atrial fibrillation Ventricular premature complex Right bundle branch block No acute changes No significant change since last tracing Confirmed by Varney Biles [phone]) on 02/13/2022 10:11:35 PM  Radiology CT ABDOMEN PELVIS W CONTRAST  Result Date: 02/05/2022 CLINICAL DATA:  Epigastric abdominal pain EXAM: CT ABDOMEN AND PELVIS WITH CONTRAST TECHNIQUE: Multidetector CT imaging of the abdomen and pelvis was performed using the standard protocol following bolus administration of intravenous contrast. RADIATION DOSE REDUCTION: This exam was performed according to the departmental dose-optimization program which includes automated exposure control, adjustment of the mA and/or kV according to patient size and/or use of iterative reconstruction technique. CONTRAST:  152mL OMNIPAQUE IOHEXOL 300 MG/ML  SOLN COMPARISON:  None Available. FINDINGS: Lower chest: The visualized lung bases are clear. Mild global cardiomegaly. Hepatobiliary: Moderate hepatic steatosis. No focal enhancing intrahepatic mass identified. No intra or extrahepatic biliary ductal dilation. Gallbladder unremarkable. Pancreas: Unremarkable Spleen: The spleen is normal in size. Large splenorenal shunt identified. Adrenals/Urinary Tract: Adrenal glands are unremarkable. Kidneys are normal, without renal calculi, focal lesion, or hydronephrosis. Bladder is unremarkable. Stomach/Bowel: Moderate diverticulosis of the sigmoid colon without superimposed acute inflammatory change. The stomach, small bowel, and large bowel are otherwise unremarkable. Appendix absent. No free intraperitoneal gas or fluid. Vascular/Lymphatic: Aortic atherosclerosis. No enlarged abdominal or pelvic lymph nodes.  Reproductive: Status post hysterectomy. No adnexal masses. Other: No abdominal wall hernia Musculoskeletal: Degenerative changes seen the lumbar spine with superimposed lumbar levoscoliosis. No acute bone abnormality. No lytic or blastic bone lesion. IMPRESSION: 1. No acute intra-abdominal pathology identified. No definite radiographic explanation for the patient's reported symptoms. 2. Moderate hepatic steatosis. 3. Large splenorenal shunt, suggesting portal venous hypertension with portosystemic collateralization. This is unchanged from remote prior examination of 02/22/2017. 4. Moderate sigmoid diverticulosis without superimposed acute inflammatory change. Aortic Atherosclerosis (ICD10-I70.0). Electronically Signed   By: Fidela Salisbury M.D.   On: 02/15/2022 22:51   DG Chest Portable 1 View  Result  Date: 02/24/2022 CLINICAL DATA:  Shortness of breath and epigastric pain. EXAM: PORTABLE CHEST 1 VIEW COMPARISON:  01/09/2022 PA Lat FINDINGS: Mild cardiomegaly. No vascular congestion is seen. Stable mediastinal configuration with aortic tortuosity and patchy calcification. Lungs are expiratory. There is asymmetric increased opacity left base which could be simple atelectasis or left basilar pneumonia. Remaining hypoaerated lungs are generally clear. The sulci are sharp. There is osteopenia, degenerative change and dextroscoliosis of thoracic spine. IMPRESSION: 1. Expiratory exam with asymmetric increased opacity left base which could be atelectasis or pneumonia. Follow-up study recommended in full inspiration. 2. Cardiomegaly. 3. Aortic atherosclerosis. 4. Scoliosis and degenerative change. Electronically Signed   By: Telford Nab M.D.   On: 02/25/2022 20:43    Procedures Procedures    Medications Ordered in ED Medications  sodium chloride 0.9 % bolus 500 mL (has no administration in time range)  pantoprazole (PROTONIX) injection 40 mg (has no administration in time range)  fentaNYL (SUBLIMAZE)  injection 50 mcg (has no administration in time range)    ED Course/ Medical Decision Making/ A&P                           Medical Decision Making Amount and/or Complexity of Data Reviewed Labs: ordered. Radiology: ordered.  Risk Prescription drug management.   Alert 88 year old female in no acute distress, nontoxic-appearing.  Patient does appear chronically ill and underweight.  Presents to the ED with a chief complaint of epigastric abdominal pain.  Information was obtained from patient, patient's daughter, and patient's son.  I reviewed patient's past medical records including previous prior notes, labs, and imaging.  Patient has medical history as outlined in HPI which complicates her care.  With reports of epigastric abdominal pain differential diagnosis includes but is not limited to hepatobiliary disease, acute pancreatitis, GERD, peptic ulcer disease.  Will obtain CMP, CBC, lipase, UA, and CT abdomen pelvis for further evaluation.  Due to patient's reports of shortness of breath will obtain chest x-ray.  Patient given fluid bolus, fentanyl, and Protonix.  I personally viewed and interpreted patient's lab results.  Pertinent findings include: -AST, ALT, alk phos, total bili all within normal limits -No leukocytosis or anemia -Lipase within normal limits -UA pending  I personally viewed and interpreted patient CT imaging.  Agree with radiology interpretation of no acute intra-abdominal or intrapelvic abnormality at this time.  I personally viewed interpret patient's chest x-ray.  Agree with radiology interpretation of expiratory exam with asymmetric increased opacity left base which could represent atelectasis or pneumonia.  Cardiomegaly.  Lower suspicion for pneumonia at this time as patient has not had any cough or fever.  Would recommend patient follow-up with PCP in outpatient setting for repeat x-ray imaging.  Patient care discussed with attending physician Dr.  Kathrynn Humble.  Patient reported nausea after receiving fentanyl.  We will give patient Zofran at this time.  Urinalysis is pending at this time.  If patient cannot tolerate p.o. intake we will plan to discharge patient with course of Protonix and Carafate.  Patient to follow-up with PCP/gastroenterology.  Patient care transferred to [place] at the end of my shift. Patient presentation, ED course, and plan of care discussed with review of all pertinent labs and imaging. Please see his/her note for further details regarding further ED course and disposition.         Final Clinical Impression(s) / ED Diagnoses Final diagnoses:  None    Rx / DC Orders ED  Discharge Orders     None         Dyann Ruddle 02/16/2022 [id number]    Varney Biles, MD 02/25/2022 2350

## 2022-02-11 ENCOUNTER — Emergency Department (HOSPITAL_COMMUNITY): Payer: Medicare Other

## 2022-02-11 ENCOUNTER — Inpatient Hospital Stay (HOSPITAL_COMMUNITY): Payer: Medicare Other

## 2022-02-11 DIAGNOSIS — E039 Hypothyroidism, unspecified: Secondary | ICD-10-CM

## 2022-02-11 DIAGNOSIS — J9622 Acute and chronic respiratory failure with hypercapnia: Secondary | ICD-10-CM | POA: Diagnosis present

## 2022-02-11 DIAGNOSIS — L89621 Pressure ulcer of left heel, stage 1: Secondary | ICD-10-CM | POA: Diagnosis present

## 2022-02-11 DIAGNOSIS — R9431 Abnormal electrocardiogram [ECG] [EKG]: Secondary | ICD-10-CM

## 2022-02-11 DIAGNOSIS — I340 Nonrheumatic mitral (valve) insufficiency: Secondary | ICD-10-CM | POA: Diagnosis present

## 2022-02-11 DIAGNOSIS — J9602 Acute respiratory failure with hypercapnia: Secondary | ICD-10-CM | POA: Diagnosis not present

## 2022-02-11 DIAGNOSIS — R079 Chest pain, unspecified: Secondary | ICD-10-CM | POA: Diagnosis not present

## 2022-02-11 DIAGNOSIS — Z853 Personal history of malignant neoplasm of breast: Secondary | ICD-10-CM

## 2022-02-11 DIAGNOSIS — M1990 Unspecified osteoarthritis, unspecified site: Secondary | ICD-10-CM | POA: Diagnosis present

## 2022-02-11 DIAGNOSIS — K219 Gastro-esophageal reflux disease without esophagitis: Secondary | ICD-10-CM | POA: Diagnosis present

## 2022-02-11 DIAGNOSIS — L89611 Pressure ulcer of right heel, stage 1: Secondary | ICD-10-CM | POA: Diagnosis present

## 2022-02-11 DIAGNOSIS — G9349 Other encephalopathy: Secondary | ICD-10-CM | POA: Diagnosis not present

## 2022-02-11 DIAGNOSIS — Z7189 Other specified counseling: Secondary | ICD-10-CM | POA: Diagnosis not present

## 2022-02-11 DIAGNOSIS — R54 Age-related physical debility: Secondary | ICD-10-CM | POA: Diagnosis present

## 2022-02-11 DIAGNOSIS — E870 Hyperosmolality and hypernatremia: Secondary | ICD-10-CM | POA: Diagnosis not present

## 2022-02-11 DIAGNOSIS — R64 Cachexia: Secondary | ICD-10-CM | POA: Diagnosis present

## 2022-02-11 DIAGNOSIS — J189 Pneumonia, unspecified organism: Secondary | ICD-10-CM | POA: Diagnosis present

## 2022-02-11 DIAGNOSIS — J9601 Acute respiratory failure with hypoxia: Secondary | ICD-10-CM | POA: Diagnosis not present

## 2022-02-11 DIAGNOSIS — Z515 Encounter for palliative care: Secondary | ICD-10-CM | POA: Diagnosis not present

## 2022-02-11 DIAGNOSIS — Z20822 Contact with and (suspected) exposure to covid-19: Secondary | ICD-10-CM | POA: Diagnosis present

## 2022-02-11 DIAGNOSIS — E7800 Pure hypercholesterolemia, unspecified: Secondary | ICD-10-CM | POA: Diagnosis present

## 2022-02-11 DIAGNOSIS — Z681 Body mass index (BMI) 19 or less, adult: Secondary | ICD-10-CM | POA: Diagnosis not present

## 2022-02-11 DIAGNOSIS — E1165 Type 2 diabetes mellitus with hyperglycemia: Secondary | ICD-10-CM

## 2022-02-11 DIAGNOSIS — I4892 Unspecified atrial flutter: Secondary | ICD-10-CM | POA: Diagnosis present

## 2022-02-11 DIAGNOSIS — R1013 Epigastric pain: Secondary | ICD-10-CM | POA: Diagnosis present

## 2022-02-11 DIAGNOSIS — J9621 Acute and chronic respiratory failure with hypoxia: Secondary | ICD-10-CM | POA: Diagnosis present

## 2022-02-11 DIAGNOSIS — L8990 Pressure ulcer of unspecified site, unspecified stage: Secondary | ICD-10-CM | POA: Insufficient documentation

## 2022-02-11 DIAGNOSIS — Y842 Radiological procedure and radiotherapy as the cause of abnormal reaction of the patient, or of later complication, without mention of misadventure at the time of the procedure: Secondary | ICD-10-CM | POA: Diagnosis present

## 2022-02-11 DIAGNOSIS — I4819 Other persistent atrial fibrillation: Secondary | ICD-10-CM

## 2022-02-11 DIAGNOSIS — M436 Torticollis: Secondary | ICD-10-CM

## 2022-02-11 DIAGNOSIS — J690 Pneumonitis due to inhalation of food and vomit: Secondary | ICD-10-CM | POA: Diagnosis present

## 2022-02-11 DIAGNOSIS — R627 Adult failure to thrive: Secondary | ICD-10-CM | POA: Diagnosis present

## 2022-02-11 DIAGNOSIS — L89151 Pressure ulcer of sacral region, stage 1: Secondary | ICD-10-CM | POA: Diagnosis present

## 2022-02-11 DIAGNOSIS — D696 Thrombocytopenia, unspecified: Secondary | ICD-10-CM | POA: Diagnosis present

## 2022-02-11 DIAGNOSIS — Z66 Do not resuscitate: Secondary | ICD-10-CM | POA: Diagnosis not present

## 2022-02-11 LAB — I-STAT VENOUS BLOOD GAS, ED
Acid-Base Excess: 4 mmol/L — ABNORMAL HIGH (ref 0.0–2.0)
Acid-Base Excess: 5 mmol/L — ABNORMAL HIGH (ref 0.0–2.0)
Bicarbonate: 35.9 mmol/L — ABNORMAL HIGH (ref 20.0–28.0)
Bicarbonate: 32.8 mmol/L — ABNORMAL HIGH (ref 20.0–28.0)
Calcium, Ion: 1.24 mmol/L (ref 1.15–1.40)
Calcium, Ion: 1.08 mmol/L — ABNORMAL LOW (ref 1.15–1.40)
HCT: 47 % — ABNORMAL HIGH (ref 36.0–46.0)
HCT: 38 % (ref 36.0–46.0)
Hemoglobin: 16 g/dL — ABNORMAL HIGH (ref 12.0–15.0)
Hemoglobin: 12.9 g/dL (ref 12.0–15.0)
O2 Saturation: 91 %
O2 Saturation: 99 %
Potassium: 4.6 mmol/L (ref 3.5–5.1)
Potassium: 4.4 mmol/L (ref 3.5–5.1)
Sodium: 141 mmol/L (ref 135–145)
Sodium: 141 mmol/L (ref 135–145)
TCO2: 39 mmol/L — ABNORMAL HIGH (ref 22–32)
TCO2: 35 mmol/L — ABNORMAL HIGH (ref 22–32)
pCO2, Ven: 88.8 mmHg (ref 44–60)
pCO2, Ven: 60.7 mmHg — ABNORMAL HIGH (ref 44–60)
pH, Ven: 7.214 — ABNORMAL LOW (ref 7.25–7.43)
pH, Ven: 7.341 (ref 7.25–7.43)
pO2, Ven: 76 mmHg — ABNORMAL HIGH (ref 32–45)
pO2, Ven: 165 mmHg — ABNORMAL HIGH (ref 32–45)

## 2022-02-11 LAB — MRSA NEXT GEN BY PCR, NASAL: MRSA by PCR Next Gen: NOT DETECTED

## 2022-02-11 LAB — POCT I-STAT 7, (LYTES, BLD GAS, ICA,H+H)
Acid-Base Excess: 7 mmol/L — ABNORMAL HIGH (ref 0.0–2.0)
Bicarbonate: 37.7 mmol/L — ABNORMAL HIGH (ref 20.0–28.0)
Calcium, Ion: 1.34 mmol/L (ref 1.15–1.40)
HCT: 41 % (ref 36.0–46.0)
Hemoglobin: 13.9 g/dL (ref 12.0–15.0)
O2 Saturation: 98 %
Patient temperature: 37
Potassium: 4.2 mmol/L (ref 3.5–5.1)
Sodium: 143 mmol/L (ref 135–145)
TCO2: 40 mmol/L — ABNORMAL HIGH (ref 22–32)
pCO2 arterial: 83.8 mmHg (ref 32–48)
pH, Arterial: 7.261 — ABNORMAL LOW (ref 7.35–7.45)
pO2, Arterial: 125 mmHg — ABNORMAL HIGH (ref 83–108)

## 2022-02-11 LAB — I-STAT ARTERIAL BLOOD GAS, ED
Acid-Base Excess: 5 mmol/L — ABNORMAL HIGH (ref 0.0–2.0)
Bicarbonate: 36.9 mmol/L — ABNORMAL HIGH (ref 20.0–28.0)
Calcium, Ion: 1.36 mmol/L (ref 1.15–1.40)
HCT: 42 % (ref 36.0–46.0)
Hemoglobin: 14.3 g/dL (ref 12.0–15.0)
O2 Saturation: 99 %
Patient temperature: 97.4
Potassium: 4.6 mmol/L (ref 3.5–5.1)
Sodium: 142 mmol/L (ref 135–145)
TCO2: 40 mmol/L — ABNORMAL HIGH (ref 22–32)
pCO2 arterial: 94.5 mmHg (ref 32–48)
pH, Arterial: 7.195 — CL (ref 7.35–7.45)
pO2, Arterial: 201 mmHg — ABNORMAL HIGH (ref 83–108)

## 2022-02-11 LAB — ECHOCARDIOGRAM COMPLETE
AR max vel: 1.86 cm2
AV Area VTI: 2 cm2
AV Area mean vel: 1.82 cm2
AV Mean grad: 2 mmHg
AV Peak grad: 3.2 mmHg
Ao pk vel: 0.9 m/s
Calc EF: 58.4 %
Height: 61 in
MV M vel: 2.81 m/s
MV Peak grad: 31.6 mmHg
Radius: 0.3 cm
S' Lateral: 2.6 cm
Single Plane A2C EF: 53.4 %
Single Plane A4C EF: 66 %
Weight: 1632 oz

## 2022-02-11 LAB — MAGNESIUM: Magnesium: 1.8 mg/dL (ref 1.7–2.4)

## 2022-02-11 LAB — SARS CORONAVIRUS 2 BY RT PCR: SARS Coronavirus 2 by RT PCR: NEGATIVE

## 2022-02-11 LAB — AMMONIA: Ammonia: 24 umol/L (ref 9–35)

## 2022-02-11 LAB — LACTIC ACID, PLASMA
Lactic Acid, Venous: 1.4 mmol/L (ref 0.5–1.9)
Lactic Acid, Venous: 1.3 mmol/L (ref 0.5–1.9)

## 2022-02-11 LAB — URINALYSIS, ROUTINE W REFLEX MICROSCOPIC
Bilirubin Urine: NEGATIVE
Glucose, UA: NEGATIVE mg/dL
Hgb urine dipstick: NEGATIVE
Ketones, ur: NEGATIVE mg/dL
Leukocytes,Ua: NEGATIVE
Nitrite: NEGATIVE
Protein, ur: NEGATIVE mg/dL
Specific Gravity, Urine: >1.046 — ABNORMAL HIGH (ref 1.005–1.030)
pH: 5 (ref 5.0–8.0)

## 2022-02-11 LAB — GLUCOSE, CAPILLARY
Glucose-Capillary: 144 mg/dL — ABNORMAL HIGH (ref 70–99)
Glucose-Capillary: 82 mg/dL (ref 70–99)
Glucose-Capillary: 84 mg/dL (ref 70–99)

## 2022-02-11 LAB — SEDIMENTATION RATE: Sed Rate: 5 mm/hr (ref 0–22)

## 2022-02-11 LAB — PROCALCITONIN: Procalcitonin: <0.1 ng/mL

## 2022-02-11 LAB — PROTIME-INR
INR: 1.4 — ABNORMAL HIGH (ref 0.8–1.2)
Prothrombin Time: 17 seconds — ABNORMAL HIGH (ref 11.4–15.2)

## 2022-02-11 LAB — TROPONIN I (HIGH SENSITIVITY)
Troponin I (High Sensitivity): 35 ng/L — ABNORMAL HIGH (ref <18)
Troponin I (High Sensitivity): 41 ng/L — ABNORMAL HIGH (ref <18)

## 2022-02-11 LAB — BRAIN NATRIURETIC PEPTIDE: B Natriuretic Peptide: 65.2 pg/mL (ref 0.0–100.0)

## 2022-02-11 MED ORDER — LEVOTHYROXINE SODIUM 50 MCG PO TABS
50.0000 ug | ORAL_TABLET | Freq: Every day | ORAL | Status: DC
  Administered 2022-02-12 – 2022-02-13 (×2): 50 ug via ORAL
  Filled 2022-02-11: qty 2
  Filled 2022-02-11: qty 1
  Filled 2022-02-11: qty 2

## 2022-02-11 MED ORDER — SODIUM CHLORIDE 0.9 % IV SOLN
500.0000 mg | Freq: Once | INTRAVENOUS | Status: AC
  Administered 2022-02-11: 500 mg via INTRAVENOUS
  Filled 2022-02-11: qty 5

## 2022-02-11 MED ORDER — ONDANSETRON HCL 4 MG PO TABS: 4.0000 mg | ORAL_TABLET | Freq: Four times a day (QID) | ORAL | Status: DC | PRN

## 2022-02-11 MED ORDER — ACETAMINOPHEN 650 MG RE SUPP: 650.0000 mg | Freq: Four times a day (QID) | RECTAL | Status: DC | PRN

## 2022-02-11 MED ORDER — ONDANSETRON HCL 4 MG/2ML IJ SOLN
4.0000 mg | Freq: Four times a day (QID) | INTRAMUSCULAR | Status: DC | PRN
  Administered 2022-02-13: 4 mg via INTRAVENOUS
  Filled 2022-02-11: qty 2

## 2022-02-11 MED ORDER — LACTATED RINGERS IV BOLUS (SEPSIS)
1000.0000 mL | Freq: Once | INTRAVENOUS | Status: AC
  Administered 2022-02-11: 1000 mL via INTRAVENOUS

## 2022-02-11 MED ORDER — ACETAMINOPHEN 325 MG PO TABS
650.0000 mg | ORAL_TABLET | Freq: Four times a day (QID) | ORAL | Status: DC | PRN
  Administered 2022-02-12 – 2022-02-13 (×2): 650 mg via ORAL
  Filled 2022-02-11 (×2): qty 2

## 2022-02-11 MED ORDER — APIXABAN 2.5 MG PO TABS
2.5000 mg | ORAL_TABLET | Freq: Two times a day (BID) | ORAL | Status: DC
  Administered 2022-02-11 – 2022-02-13 (×4): 2.5 mg via ORAL
  Filled 2022-02-11 (×4): qty 1

## 2022-02-11 MED ORDER — ORAL CARE MOUTH RINSE: 15.0000 mL | OROMUCOSAL | Status: DC | PRN

## 2022-02-11 MED ORDER — ORAL CARE MOUTH RINSE
15.0000 mL | OROMUCOSAL | Status: DC
  Administered 2022-02-11 – 2022-02-13 (×7): 15 mL via OROMUCOSAL

## 2022-02-11 MED ORDER — INSULIN ASPART 100 UNIT/ML IJ SOLN
0.0000 [IU] | Freq: Three times a day (TID) | INTRAMUSCULAR | Status: DC
  Administered 2022-02-11: 2 [IU] via SUBCUTANEOUS
  Administered 2022-02-12 – 2022-02-13 (×2): 3 [IU] via SUBCUTANEOUS

## 2022-02-11 MED ORDER — APIXABAN 2.5 MG PO TABS: 2.5000 mg | ORAL_TABLET | Freq: Two times a day (BID) | ORAL | Status: DC

## 2022-02-11 MED ORDER — ALBUTEROL SULFATE (2.5 MG/3ML) 0.083% IN NEBU
2.5000 mg | INHALATION_SOLUTION | RESPIRATORY_TRACT | Status: DC | PRN
Start: 2022-02-11 — End: 2022-02-14

## 2022-02-11 MED ORDER — SODIUM CHLORIDE 0.9 % IV SOLN
1.0000 g | Freq: Once | INTRAVENOUS | Status: AC
  Administered 2022-02-11: 1 g via INTRAVENOUS
  Filled 2022-02-11: qty 10

## 2022-02-11 MED ORDER — SODIUM CHLORIDE 0.9% FLUSH
3.0000 mL | Freq: Two times a day (BID) | INTRAVENOUS | Status: DC
  Administered 2022-02-11 – 2022-02-13 (×5): 3 mL via INTRAVENOUS

## 2022-02-11 MED ORDER — SODIUM CHLORIDE 0.9 % IV SOLN
1.0000 g | INTRAVENOUS | Status: DC
  Administered 2022-02-12 – 2022-02-13 (×2): 1 g via INTRAVENOUS
  Filled 2022-02-11 (×2): qty 10

## 2022-02-11 MED ORDER — CHLORHEXIDINE GLUCONATE CLOTH 2 % EX PADS
6.0000 | MEDICATED_PAD | Freq: Every day | CUTANEOUS | Status: DC
  Administered 2022-02-11 – 2022-02-13 (×3): 6 via TOPICAL

## 2022-02-11 MED ORDER — MORPHINE SULFATE (PF) 2 MG/ML IV SOLN
2.0000 mg | INTRAVENOUS | Status: DC | PRN
  Administered 2022-02-11: 2 mg via INTRAVENOUS
  Filled 2022-02-11: qty 1

## 2022-02-11 NOTE — ED Provider Notes (Signed)
Physical Exam  BP 102/67   Pulse (!) 111   Temp 97.7 F (36.5 C)   Resp 16   Ht '5\' 1"'$  (1.549 m)   Wt 46.3 kg   SpO2 100%   BMI 19.27 kg/m   Physical Exam  Procedures  .Critical Care  Performed by: Emeline Darling, PA-C Authorized by: Emeline Darling, PA-C   Critical care provider statement:    Critical care time (minutes):  45   Critical care was time spent personally by me on the following activities:  Development of treatment plan with patient or surrogate, discussions with consultants, evaluation of patient's response to treatment, examination of patient, obtaining history from patient or surrogate, ordering and performing treatments and interventions, ordering and review of laboratory studies, ordering and review of radiographic studies, pulse oximetry and re-evaluation of patient's condition   ED Course / MDM   Clinical Course as of 02/11/22 0529  Wed Feb 11, 2022  0456 Patient taken off of O2 for CT (?), when I entered the room patient was 75% on RA, placed on NRB, improved sats, weaned to 4 L [place]. Not on O2 at baseline.  [RS]  0507 Mental status improved, able to answer questions, A&O x 3.  [RS]  9381 Consult to Dr. Linda Hedges, hospitalist, who is agreeable to seeing this patient and admitting her to his service, I appreciate his collaboration in the care of this patient.  [RS]    Clinical Course User Index [RS] Maxima Skelton, Gypsy Balsam, PA-C   Medical Decision Making Amount and/or Complexity of Data Reviewed Labs: ordered. Radiology: ordered. ECG/medicine tests: ordered.  Risk Prescription drug management. Decision regarding hospitalization.   Care of this patient assumed from preceding ED provider Debbe Mounts, PA-C at time of shift change. Please see his associated note for further insight into the patient's ED course. In brief, 88 year old female with a few weeks of epigastric pain and shortness of breath who is having difficulty with p.o. tolerance  and has generally been having decline in weight and functionality for the last several months.  CBC with leukocytosis or anemia, CMP unremarkable.  INR 1.4 anticoagulated on Eliquis. CT of the abdomen pelvis negative for acute intra-abdominal pelvic abnormality to explain patient's symptoms.  Chest x-ray obtained prior to my arrival did show left base opacity concerning for pneumonia, patient has been concerned about shortness of breath.  Attempt to change patient pending urinalysis.  If she can p.o. challenge may be discharged home. UA without evidence of infection.  When he presented to the room to evaluate the patient she was very difficult to arouse, not forming coherent words in response to my questions, and requiring 2 L of oxygen to maintain her saturations greater than 90%.  She does not wear oxygen at baseline.  Given concern for altered mental status additional laboratory studies were obtained including ammonia which was normal and VBG without acidosis.    CT head negative for acute intracranial abnormalities.  Patient with increased oxygen requirement to 4 L at this time, CAP coverage ordered.  Patient will require admission to the hospital for further stabilization.  Billy voiced understanding medical evaluation and treatment plan thus far.  She is agreeable to plan for admission.  Consult to hospitalist as above.  This chart was dictated using voice recognition software, Dragon. Despite the best efforts of this provider to proofread and correct errors, errors may still occur which can change documentation meaning.  Emeline Darling, PA-C 02/11/22 0533    Fatima Blank, MD 02/12/22 [phone]

## 2022-02-11 NOTE — Progress Notes (Signed)
*  Late Note* Pt given break from biPAP and placed on 2L [place] and tolerating well. RN aware. RT will cont to monitor as needed.

## 2022-02-11 NOTE — Consult Note (Addendum)
NAME:  Julie Norton, MRN:  [id number], DOB:  [birth year], LOS: 0 ADMISSION DATE:  02/18/2022, CONSULTATION DATE: 02/11/2022 REFERRING MD: [facility], CHIEF COMPLAINT: Acute hypercarbic respiratory failure   History of Present Illness:  88 year old female never smoker is admitted to the emergency room on 02/11/2022 with chief complaint of abdominal pain.  CT of the abdomen revealed multiple diverticula without acute process.  CT of the head shows no acute process.  While in the radiology she is found to be hypoxic in 2035% with placed on nonrebreather.  She subsequently received fentanyl and morphine over 3 hours.  She became somnolent follow-up ABG pH 7.19 CO2 95 she is placed on on invasive mechanical ventilatory support she is now awake follow commands although she is extremely hard of hearing.  Due to her multiple comorbidities to be admitted to the intensive care unit and treated for pneumonia.  No further narcotics will be administered.   Pertinent  Medical History   Past Medical History:  Diagnosis Date   Abdominal adhesions    Abdominal gas pain    suspected -- may be related to intermittent right upper quadrant discomfort radiating around her back       Anemia    Anginal pain (HCC)    Arthritis    Atrial fibrillation (HCC)    Breast cancer (HCC)    Breast mass    left breast   Cancer ([place])    left breast   Chest pain    intermittent right upper quadrant discomfort radiating around her back       Complication of anesthesia    Diabetes mellitus without complication (HCC)    Type II   Dyspepsia    Dyspnea    GERD (gastroesophageal reflux disease)    H/O: hysterectomy 1978   History of cardioversion    Hypercholesteremia    Hypothyroidism    Mitral insufficiency    Personal history of radiation therapy    Pneumonia    PONV (postoperative nausea and vomiting)    Right bundle branch block (RBBB)    Tendinitis    of the right arm and shoulder   Torn tendon    right shoulder      Significant Hospital Events: Including procedures, antibiotic start and stop dates in addition to other pertinent events   02/11/2022 being placed on noninvasive ventilatory support  Interim History / Subjective:  Presented with abdominal pain found to be hypoxic after being treated with narcotics x 2 was found to be hypercarbic.  Objective   Blood pressure 125/70, pulse 87, temperature (!) 97.4 F (36.3 C), temperature source Oral, resp. rate (!) 26, height '5\' 1"'$  (1.549 m), weight 46.3 kg, SpO2 98 %.       No intake or output data in the 24 hours ending 02/11/22 0934 Filed Weights   02/26/2022 1937  Weight: 46.3 kg    Examination: General: Elderly female currently on noninvasive mechanical ventilatory support who is arousable attempts to follow commands extremely hard of hearing HENT: Full facemask in place Lungs: Good air movement bilaterally currently on 25% FiO2 sats 97% Cardiovascular: Heart sounds are regular with a regular rhythm Abdomen: Positive bowel sounds slightly tender to palpation Extremities: 1+ edema Neuro: Follows simple commands GU: Amber urine  Resolved Hospital Problem list     Assessment & Plan:  Acute hypercarbic hypoxic respiratory failure with possible left lower lobe pneumonia in elderly female.  Currently on noninvasive mechanical ventilator support for pH 7.195 PC O2 of 95  O2 as needed to keep sats greater than 90% Noninvasive mechanical ventilator support to treat lethargy and high pCO2 Stop all narcotics Bronchodilators Wean BiPAP as tolerated but remain nocturnal and as needed Admitted to the intensive care unit Agree with antibiotics Panculture No role for steroids at this time Consider CT chest   Chronic atrial fibrillation with multiple cardioversions in the past currently on Eliquis. Admit to the intensive care unit Cardiac monitoring Currently on Eliquis  Complaints of chest pain history of right bundle branch block Check  troponins Check twelve-lead 2D echo  Abdominal pain Negative radiographic workup  Diabetes mellitus CBG (last 3)  No results for input(s): "GLUCAP" in the last 72 hours.  Sliding-scale insulin protocol             Best Practice (right click and "Reselect all SmartList Selections" daily)   Diet/type: NPO DVT prophylaxis: DOAC GI prophylaxis: PPI Lines: N/A Foley:  N/A Code Status:  full code Last date of multidisciplinary goals of care discussion [tbd]  Labs   CBC: Recent Labs  Lab 02/21/2022 1945 02/11/22 0153 02/11/22 0501 02/11/22 0831  WBC 7.5  --   --   --   NEUTROABS 5.5  --   --   --   HGB 16.0* 16.0* 12.9 14.3  HCT 47.1* 47.0* 38.0 42.0  MCV 94.4  --   --   --   PLT 129*  --   --   --     Basic Metabolic Panel: Recent Labs  Lab 03/05/2022 1945 02/11/22 0153 02/11/22 0501 02/11/22 0831  NA 141 141 141 142  K 4.3 4.6 4.4 4.6  CL 102  --   --   --   CO2 32  --   --   --   GLUCOSE 171*  --   --   --   BUN 18  --   --   --   CREATININE 0.60  --   --   --   CALCIUM 10.1  --   --   --    GFR: Estimated Creatinine Clearance: 34.8 mL/min (by C-G formula based on SCr of 0.6 mg/dL). Recent Labs  Lab 02/22/2022 1945 02/11/22 0140 02/11/22 0354  WBC 7.5  --   --   LATICACIDVEN  --  1.4 1.3    Liver Function Tests: Recent Labs  Lab 02/03/2022 1945  AST 21  ALT 17  ALKPHOS 53  BILITOT 0.9  PROT 6.1*  ALBUMIN 3.6   Recent Labs  Lab 02/11/2022 1945  LIPASE 39   Recent Labs  Lab 02/11/22 0255  AMMONIA 24    ABG    Component Value Date/Time   PHART 7.195 (LL) 02/11/2022 0831   PCO2ART 94.5 (HH) 02/11/2022 0831   PO2ART 201 (H) 02/11/2022 0831   HCO3 36.9 (H) 02/11/2022 0831   TCO2 40 (H) 02/11/2022 0831   O2SAT 99 02/11/2022 0831     Coagulation Profile: Recent Labs  Lab 02/11/22 0140  INR 1.4*    Cardiac Enzymes: No results for input(s): "CKTOTAL", "CKMB", "CKMBINDEX", "TROPONINI" in the last 168 hours.  HbA1C: Hgb  A1c MFr Bld  Date/Time Value Ref Range Status  01/26/2022 03:07 PM 7.3 (H) 4.6 - 6.5 % Final    Comment:    Glycemic Control Guidelines for People with Diabetes:Non Diabetic:  <6%Goal of Therapy: <7%Additional Action Suggested:  >8%   10/20/2021 03:13 PM 7.4 (H) 4.6 - 6.5 % Final    Comment:    Glycemic  Control Guidelines for People with Diabetes:Non Diabetic:  <6%Goal of Therapy: <7%Additional Action Suggested:  >8%     CBG: No results for input(s): "GLUCAP" in the last 168 hours.  Review of Systems:   na  Past Medical History:  She,  has a past medical history of Abdominal adhesions, Abdominal gas pain, Anemia, Anginal pain ([place]), Arthritis, Atrial fibrillation ([place]), Breast cancer ([place]), Breast mass, Cancer ([place]), Chest pain, Complication of anesthesia, Diabetes mellitus without complication ([place]), Dyspepsia, Dyspnea, GERD (gastroesophageal reflux disease), H/O: hysterectomy (1978), History of cardioversion, Hypercholesteremia, Hypothyroidism, Mitral insufficiency, Personal history of radiation therapy, Pneumonia, PONV (postoperative nausea and vomiting), Right bundle branch block (RBBB), Tendinitis, and Torn tendon.   Surgical History:   Past Surgical History:  Procedure Laterality Date   ABDOMINAL HYSTERECTOMY     APPENDECTOMY  1978   BLADDER REPAIR     BREAST LUMPECTOMY Left 02/24/2011   central partial mastectomy - dr Margot Chimes   BREAST REDUCTION SURGERY     CARDIOVERSION N/A 10/28/2017   Procedure: CARDIOVERSION;  Surgeon: Lelon Perla, MD;  Location: [place];  Service: Cardiovascular;  Laterality: N/A;   CARDIOVERSION N/A 04/06/2019   Procedure: CARDIOVERSION;  Surgeon: [name], MD;  Location: [facility] ENDOSCOPY;  Service: Cardiovascular;  Laterality: N/A;   CARDIOVERSION N/A 09/08/2019   Procedure: CARDIOVERSION;  Surgeon: Dorothy Spark, MD;  Location: [place];  Service: Cardiovascular;  Laterality: N/A;   CARDIOVERSION N/A 08/28/2021   Procedure:  CARDIOVERSION;  Surgeon: [name], MD;  Location: [place];  Service: Cardiovascular;  Laterality: N/A;   COLONOSCOPY WITH PROPOFOL N/A 03/24/2017   Procedure: COLONOSCOPY WITH PROPOFOL;  Surgeon: Wilford Corner, MD;  Location: [place];  Service: Endoscopy;  Laterality: N/A;   ESOPHAGOGASTRODUODENOSCOPY (EGD) WITH PROPOFOL N/A 03/24/2017   Procedure: ESOPHAGOGASTRODUODENOSCOPY (EGD) WITH PROPOFOL;  Surgeon: Wilford Corner, MD;  Location: [place];  Service: Endoscopy;  Laterality: N/A;   LIPOMA EXCISION Right    REDUCTION MAMMAPLASTY Bilateral 1998   RETINAL DETACHMENT SURGERY Left    SALPINGOOPHORECTOMY     TEE WITHOUT CARDIOVERSION N/A 10/28/2017   Procedure: TRANSESOPHAGEAL ECHOCARDIOGRAM (TEE);  Surgeon: Lelon Perla, MD;  Location: [facility] ENDOSCOPY;  Service: Cardiovascular;  Laterality: N/A;   [place]   at 88 years of age     Social History:   reports that she has never smoked. She has never used smokeless tobacco. She reports that she does not drink alcohol and does not use drugs.   Family History:  Her family history includes Aortic aneurysm in her mother and sister; Cancer in her mother; Diabetes in her mother; Heart attack in her mother; Hypertension in her mother; Pneumonia in her father; Stroke in her mother.   Allergies Allergies  Allergen Reactions   Bactrim Nausea And Vomiting   Dilaudid [Hydromorphone Hcl] Nausea Only   Hydrocodone Nausea And Vomiting   Invokana [Canagliflozin] Itching and Other (See Comments)    Yeast Infections    Macrodantin Nausea And Vomiting     Home Medications  Prior to Admission medications   Medication Sig Start Date End Date Taking? Authorizing Provider  pantoprazole (PROTONIX) 20 MG tablet Take 1 tablet (20 mg total) by mouth daily. 02/06/2022 03/12/22 Yes Badalamente, Rudell Cobb, PA-C  sucralfate (CARAFATE) 1 g tablet Take 1 tablet (1 g total) by mouth 2 (two) times daily for 14  days. 02/19/2022 02/24/22 Yes Badalamente, Rudell Cobb, PA-C  acetaminophen (TYLENOL) 500 MG tablet Take 1,000 mg by mouth every 6 (six)  hours as needed for mild pain or moderate pain.     [provider]  apixaban (ELIQUIS) 2.5 MG TABS tablet TAKE ONE TABLET BY MOUTH EVERY MORNING and TAKE ONE TABLET BY MOUTH EVERY EVENING 11/06/21   Baldwin Jamaica, PA-C  Cholecalciferol 125 MCG (5000 UT) capsule Take 1 capsule by mouth every morning.    [provider]  Continuous Blood Gluc Receiver (FREESTYLE LIBRE 2 READER) DEVI Use as directed 01/09/22   Burchette, Alinda Sierras, MD  Continuous Blood Gluc Sensor (FREESTYLE LIBRE 2 SENSOR) MISC 1 each by Does not apply route as directed. 01/09/22   Burchette, Alinda Sierras, MD  ferrous sulfate 325 (65 FE) MG EC tablet Take 325 mg daily Patient not taking: Reported on 02/02/2022 06/16/21   [place], Peter M, MD  glucose blood (ONE TOUCH ULTRA TEST) test strip Check once daily. E11.40 03/20/15   Burchette, Alinda Sierras, MD  insulin degludec (TRESIBA FLEXTOUCH) 100 UNIT/ML FlexTouch Pen Inject 24 units into THE SKIN ONCE daily AT 10pm Patient taking differently: 12 Units in the morning. 04/07/21   Burchette, Alinda Sierras, MD  Insulin Pen Needle 29G X 5MM MISC Use once daily 04/07/17   Burchette, Alinda Sierras, MD  levothyroxine (SYNTHROID) 50 MCG tablet TAKE ONE TABLET BY MOUTH BEFORE BREAKFAST 02/09/22   Burchette, Alinda Sierras, MD  lidocaine (LIDODERM) 5 % Place 3 patches onto the skin See admin instructions. Apply 3 patch to the neck /back for post herpatic neuralgia every day on for 12 hours; off for 12 hours. 12/26/21   Lovorn, Jinny Blossom, MD  loratadine (CLARITIN) 10 MG tablet Take 10 mg by mouth daily as needed for allergies.    [provider]  MAGNESIUM PO Take 250 mg by mouth daily with breakfast.    [provider]  metoprolol succinate (TOPROL-XL) 25 MG 24 hr tablet Take 0.5 tablets (12.5 mg total) by mouth daily. 02/05/22   [place], Peter M, MD  mirtazapine (REMERON) 7.5 MG  tablet Take 1 tablet (7.5 mg total) by mouth at bedtime. Patient not taking: Reported on 02/02/2022 01/19/22   Eulas Post, MD  ONE [facility] ULTRA TEST test strip CHECK ONCE A DAY 02/26/14   Burchette, Alinda Sierras, MD  potassium chloride (KLOR-CON) 10 MEQ tablet Take 1 tablet (10 mEq total) by mouth 2 (two) times daily. 09/16/21   [place], Peter M, MD  tiZANidine (ZANAFLEX) 2 MG tablet Take 1 tablet by mouth daily. Patient not taking: Reported on 02/02/2022    [provider]  torsemide (DEMADEX) 20 MG tablet Take 1 tablet (20 mg total) by mouth daily as needed. Take daily prn weight gain or swelling 09/16/21 09/11/22  [place], Peter M, MD  traZODone (DESYREL) 50 MG tablet Take 0.5-1 tablet by mouth at bedtime 01/02/22   Burchette, Alinda Sierras, MD  vitamin B-12 (CYANOCOBALAMIN) 1000 MCG tablet Take 1 tablet (1,000 mcg total) by mouth daily. 05/28/20   Burchette, Alinda Sierras, MD     Critical care time: 45 min      Richardson Landry Denis Koppel ACNP Acute Care Nurse Practitioner [place] Please consult Amion 02/11/2022, 9:34 AM

## 2022-02-11 NOTE — ED Notes (Signed)
Patient transported to ct

## 2022-02-11 NOTE — Progress Notes (Signed)
Dr. Harvest Forest made aware a bipap machine has been located and pt transitioned to it and off the [facility] at this time. Pt able to have progressive bed with bipap at this time if still appropriate. RT will continue to monitor and be available as needed.

## 2022-02-11 NOTE — H&P (Addendum)
History and Physical    Patient: Julie Norton YNW:[id number] DOB: 24-Mar-1930 DOA: 03/04/2022 DOS: the patient was seen and examined on 02/11/2022 PCP: Eulas Post, MD  Patient coming from: Home via EMS  Chief Complaint:  Chief Complaint  Patient presents with   Abdominal Pain   HPI: Julie Norton is a 88 y.o. female with medical history significant of atrial fibrillation on chronic anticoagulation, diabetes mellitus type 2, hypothyroidism, and breast cancer s/p lumpectomy with radiation who presented with reports of epigastric pain over the last 2-3 weeks.  Patient is not able to give much history as she is lethargic and her daughter provides additional history over the phone.  Normal ambulates with a walker. Daughter notes that anytime she was eating seem to worsen symptoms.  Pain symptoms usually seem to get better by lunchtime, but would return if she ate anything.  Over the last few days abdominal pain has been more constant.  She had associated symptoms of nausea, chest pain, fatigue, decreased p.o. intake, constipation, and shortness of breath with exertion.  Even walking a few feet patient would be extremely out of breath.  Since December of last year patient had lost over 35 pounds and has been drinking boost drinks over the last few months.  Patient was not reported to have any dysuria, blood in her stools, or significant cough.  Her daughter reports that the patient never smoked or drank alcohol.  Prior to the symptoms starting her daughter reported that she had gotten a Botox injection for torticollis.  Upon admission to the emergency department patient was noted to be afebrile with pulse anywhere from 60-110s, respirations 13-33, blood pressure 90/60 -163/114, and O2 saturations documented as low as 75% by ED provider requiring temporary placement on nonrebreather prior to being weaned down to 4 L oxygen maintain O2 saturations.  Labs from 8/8 noted hemoglobin 16, platelets  129, glucose 171, BNP 65.2, INR 1.4, and lactic acid 1.4.  Patient's initial venous blood gas noted pH of 7.214 with pCO2 of 88.8, and pO2 76.  Chest x-ray was of poor quality with increased opacity of the left lung base thought to be atelectasis or pneumonia and cardiomegaly.  CT scan of the abdomen and pelvis did not note any intra-abdominal pathology, moderate hepatic steatosis, large splenorenal shunt suggesting portal venous hypertension similar to prior exam, and moderate diverticulosis without inflammatory changes.  Urinalysis noted elevated specific gravity of 1.046 without signs of infection.  Blood cultures have been obtained.  Patient was given 1.5 L of IV fluid, antiemetics, Protonix 40 mg IV, fentanyl for pain, azithromycin, and Rocephin.  Review of Systems: As mentioned in the history of present illness. All other systems reviewed and are negative. Past Medical History:  Diagnosis Date   Abdominal adhesions    Abdominal gas pain    suspected -- may be related to intermittent right upper quadrant discomfort radiating around her back       Anemia    Anginal pain (HCC)    Arthritis    Atrial fibrillation (HCC)    Breast cancer (HCC)    Breast mass    left breast   Cancer ([place])    left breast   Chest pain    intermittent right upper quadrant discomfort radiating around her back       Complication of anesthesia    Diabetes mellitus without complication (HCC)    Type II   Dyspepsia    Dyspnea    GERD (gastroesophageal  reflux disease)    H/O: hysterectomy 1978   History of cardioversion    Hypercholesteremia    Hypothyroidism    Mitral insufficiency    Personal history of radiation therapy    Pneumonia    PONV (postoperative nausea and vomiting)    Right bundle branch block (RBBB)    Tendinitis    of the right arm and shoulder   Torn tendon    right shoulder   Past Surgical History:  Procedure Laterality Date   ABDOMINAL HYSTERECTOMY     APPENDECTOMY  1978   BLADDER  REPAIR     BREAST LUMPECTOMY Left 02/24/2011   central partial mastectomy - dr Margot Chimes   BREAST REDUCTION SURGERY     CARDIOVERSION N/A 10/28/2017   Procedure: CARDIOVERSION;  Surgeon: Lelon Perla, MD;  Location: [facility];  Service: Cardiovascular;  Laterality: N/A;   CARDIOVERSION N/A 04/06/2019   Procedure: CARDIOVERSION;  Surgeon: [name], MD;  Location: [facility];  Service: Cardiovascular;  Laterality: N/A;   CARDIOVERSION N/A 09/08/2019   Procedure: CARDIOVERSION;  Surgeon: Dorothy Spark, MD;  Location: [facility] ENDOSCOPY;  Service: Cardiovascular;  Laterality: N/A;   CARDIOVERSION N/A 08/28/2021   Procedure: CARDIOVERSION;  Surgeon: [name], MD;  Location: [place];  Service: Cardiovascular;  Laterality: N/A;   COLONOSCOPY WITH PROPOFOL N/A 03/24/2017   Procedure: COLONOSCOPY WITH PROPOFOL;  Surgeon: Wilford Corner, MD;  Location: [facility];  Service: Endoscopy;  Laterality: N/A;   ESOPHAGOGASTRODUODENOSCOPY (EGD) WITH PROPOFOL N/A 03/24/2017   Procedure: ESOPHAGOGASTRODUODENOSCOPY (EGD) WITH PROPOFOL;  Surgeon: Wilford Corner, MD;  Location: [facility];  Service: Endoscopy;  Laterality: N/A;   LIPOMA EXCISION Right    REDUCTION MAMMAPLASTY Bilateral 1998   RETINAL DETACHMENT SURGERY Left    SALPINGOOPHORECTOMY     TEE WITHOUT CARDIOVERSION N/A 10/28/2017   Procedure: TRANSESOPHAGEAL ECHOCARDIOGRAM (TEE);  Surgeon: Lelon Perla, MD;  Location: [facility] ENDOSCOPY;  Service: Cardiovascular;  Laterality: N/A;   [place]   at 88 years of age   Social History:  reports that she has never smoked. She has never used smokeless tobacco. She reports that she does not drink alcohol and does not use drugs.  Allergies  Allergen Reactions   Bactrim Nausea And Vomiting   Dilaudid [Hydromorphone Hcl] Nausea Only   Hydrocodone Nausea And Vomiting   Invokana [Canagliflozin] Itching and Other (See Comments)    Yeast  Infections    Macrodantin Nausea And Vomiting    Family History  Problem Relation Age of Onset   Pneumonia Father    Heart attack Mother    Hypertension Mother    Stroke Mother    Diabetes Mother    Aortic aneurysm Mother        abdominal aortic aneurysm   Cancer Mother        bladder   Aortic aneurysm Sister        abdominal aortic aneurysm    Prior to Admission medications   Medication Sig Start Date End Date Taking? Authorizing Provider  pantoprazole (PROTONIX) 20 MG tablet Take 1 tablet (20 mg total) by mouth daily. 02/09/2022 03/12/22 Yes Badalamente, Rudell Cobb, PA-C  sucralfate (CARAFATE) 1 g tablet Take 1 tablet (1 g total) by mouth 2 (two) times daily for 14 days. 02/28/2022 02/24/22 Yes Badalamente, Rudell Cobb, PA-C  acetaminophen (TYLENOL) 500 MG tablet Take 1,000 mg by mouth every 6 (six) hours as needed for mild pain or moderate pain.     [provider]  apixaban (ELIQUIS) 2.5 MG TABS tablet TAKE ONE TABLET BY MOUTH EVERY MORNING and TAKE ONE TABLET BY MOUTH EVERY EVENING 11/06/21   Baldwin Jamaica, PA-C  Cholecalciferol 125 MCG (5000 UT) capsule Take 1 capsule by mouth every morning.    [provider]  Continuous Blood Gluc Receiver (FREESTYLE LIBRE 2 READER) DEVI Use as directed 01/09/22   Burchette, Alinda Sierras, MD  Continuous Blood Gluc Sensor (FREESTYLE LIBRE 2 SENSOR) MISC 1 each by Does not apply route as directed. 01/09/22   Burchette, Alinda Sierras, MD  ferrous sulfate 325 (65 FE) MG EC tablet Take 325 mg daily Patient not taking: Reported on 02/02/2022 06/16/21   [place], Peter M, MD  glucose blood (ONE TOUCH ULTRA TEST) test strip Check once daily. E11.40 03/20/15   Burchette, Alinda Sierras, MD  insulin degludec (TRESIBA FLEXTOUCH) 100 UNIT/ML FlexTouch Pen Inject 24 units into THE SKIN ONCE daily AT 10pm Patient taking differently: 12 Units in the morning. 04/07/21   Burchette, Alinda Sierras, MD  Insulin Pen Needle 29G X 5MM MISC Use once daily 04/07/17   Burchette, Alinda Sierras, MD   levothyroxine (SYNTHROID) 50 MCG tablet TAKE ONE TABLET BY MOUTH BEFORE BREAKFAST 02/09/22   Burchette, Alinda Sierras, MD  lidocaine (LIDODERM) 5 % Place 3 patches onto the skin See admin instructions. Apply 3 patch to the neck /back for post herpatic neuralgia every day on for 12 hours; off for 12 hours. 12/26/21   Lovorn, Jinny Blossom, MD  loratadine (CLARITIN) 10 MG tablet Take 10 mg by mouth daily as needed for allergies.    [provider]  MAGNESIUM PO Take 250 mg by mouth daily with breakfast.    [provider]  metoprolol succinate (TOPROL-XL) 25 MG 24 hr tablet Take 0.5 tablets (12.5 mg total) by mouth daily. 02/05/22   [place], Peter M, MD  mirtazapine (REMERON) 7.5 MG tablet Take 1 tablet (7.5 mg total) by mouth at bedtime. Patient not taking: Reported on 02/02/2022 01/19/22   Eulas Post, MD  ONE [facility] ULTRA TEST test strip CHECK ONCE A DAY 02/26/14   Burchette, Alinda Sierras, MD  potassium chloride (KLOR-CON) 10 MEQ tablet Take 1 tablet (10 mEq total) by mouth 2 (two) times daily. 09/16/21   [place], Peter M, MD  tiZANidine (ZANAFLEX) 2 MG tablet Take 1 tablet by mouth daily. Patient not taking: Reported on 02/02/2022    [provider]  torsemide (DEMADEX) 20 MG tablet Take 1 tablet (20 mg total) by mouth daily as needed. Take daily prn weight gain or swelling 09/16/21 09/11/22  [place], Peter M, MD  traZODone (DESYREL) 50 MG tablet Take 0.5-1 tablet by mouth at bedtime 01/02/22   Burchette, Alinda Sierras, MD  vitamin B-12 (CYANOCOBALAMIN) 1000 MCG tablet Take 1 tablet (1,000 mcg total) by mouth daily. 05/28/20   Eulas Post, MD    Physical Exam: Vitals:   02/11/22 0731 02/11/22 0732 02/11/22 0800 02/11/22 0815  BP:   112/79 125/70  Pulse: 95  91 87  Resp: 20  20 (!) 26  Temp:  (!) 97.4 F (36.3 C)    TempSrc:  Oral    SpO2: 100%  100% 99%  Weight:      Height:       Exam  Constitutional: Cachectic elderly female who appears to be in discomfort Eyes: PERRL, lids and  conjunctivae normal ENMT: Mucous membranes are moist.   Neck: normal, supple, no JVD appreciated.   Respiratory: Normal respiratory effort currently on  BiPAP with O2 saturations maintaining. Cardiovascular: Irregular irregular. No extremity edema. 2+ pedal pulses.  Abdomen: no tenderness, no masses palpated.  Bowel sounds appreciated..  Musculoskeletal: no clubbing / cyanosis. No joint deformity upper and lower extremities.  Skin: no rashes, lesions, ulcers. No induration Neurologic: CN 2-12 grossly intact.  Presented with overall extremities Psychiatric: Lethargic unable to assess orientation at this time.  Data Reviewed:   EKG with atrial flutter at 102 bpm 2-1 AV block reviewed labs imaging and pertinent records as noted above.  Assessment and Plan:  Acute respiratory failure with hypoxia and hypercapnia Possible community-acquired pneumonia Patient presented and was noted to be hypoxic down to 70% on room air.  Chest x-ray gave concern for increased left lung opacity thought to be atelectasis versus pneumonia.  CT however did not note any lung abnormality. -Admit to a progressive bed -Continuous pulse oximetry with nasal cannula oxygen maintain O2 saturations -Check ABG due to lethargy( Revealed pH 7.195 with pCO2 95 and pO2 201) -Start BiPAP -N.p.o. until off BiPAP -Add on procalcitonin -Continue Rocephin and azithromycin -May warrant checking CT angiogram of the chest although patient has reported compliance with Eliquis -PCCM consulted as respiratory therapist notes that patient will   Abdominal/Chest pain Acute. She reportedly was complaining of epigastric abdominal and chest pain.  She was also noted to report early satiety.  CT scan of the abdomen pelvis did not note any acute intra-abdominal process to attribute to her symptoms.  Question if patient's symptoms are cardiac in nature. -Check high-sensitivity troponin -May warrant checking echocardiogram -May warrant barium  swallow and/or GI consult once fully stable  Persistent atrial fibrillation/atrial flutter on chronic anticoagulation Patient appears to be in atrial flutter/atrial fibrillation with heart rates relatively controlled at this time.  Patient has had prior cardioversions which were unsuccessful. -Continue Eliquis, but may need to switch to heparin -Goal to keep at least potassium 4 and magnesium 2  Poorly diabetes mellitus type 2 hyperglycemia Last available hemoglobin A1c 7.3 on 01/26/2022.  Home insulin regimen includes [place]. -Hypoglycemic protocols -CBGs before every meal with moderate SSI -Adjust insulin regimen as needed  Hypothyroidism TSH 1.53 on 01/26/2022.  Home medication regimen includes levothyroxine 50 mcg daily. -Continue levothyroxine  Thrombocytopenia Chronic. Platelet count 129. -Continue to monitor  History of breast cancer Patient with prior history of lumpectomy and radiation.  Weight loss Daughter reports that the patient has lost over 35 pounds since December of last year.  TSH 1.53 on 01/22/2022.  The loss gives concern for possibility of malignancy. -Check ESR  Torticollis Patient had received Botox injection approximately 3 weeks ago for symptoms.  Unclear if this has any relation with her current presentation.  GERD -Continue Protonix  DVT prophylaxis: Eliquis Advance Care Planning:   Code Status: Full Code   Consults: PCCM  Family Communication: Daughter updated over the phone.  Severity of Illness: The appropriate patient status for this patient is INPATIENT. Inpatient status is judged to be reasonable and necessary in order to provide the required intensity of service to ensure the patient's safety. The patient's presenting symptoms, physical exam findings, and initial radiographic and laboratory data in the context of their chronic comorbidities is felt to place them at high risk for further clinical deterioration. Furthermore, it is not  anticipated that the patient will be medically stable for discharge from the hospital within 2 midnights of admission.   * I certify that at the point of admission it is my clinical judgment that the  patient will require inpatient hospital care spanning beyond 2 midnights from the point of admission due to high intensity of service, high risk for further deterioration and high frequency of surveillance required.*  Author: Norval Morton, MD 02/11/2022 8:23 AM  For on call review [email or web address].

## 2022-02-11 NOTE — Progress Notes (Signed)
Critical ABG results given to Dr. Harvest Forest. Orders placed for pt to be placed on bipap at this time. MD made aware of no bipaps available at this time that pt would be provided with bipap support thru the [facility]. MD made aware pts on the [facility] can not go to a progressive bed and will need ICU. RT will continue to monitor and be available as needed.

## 2022-02-11 NOTE — Progress Notes (Signed)
2D echocardiogram completed.  02/11/2022 2:56 PM Kelby Aline., MHA, RVT, RDCS, RDMS

## 2022-02-11 NOTE — ED Notes (Signed)
Pt linen soiled. Pt's bedding changed. Pt moaning in pain. MD Tamala Julian made aware. RN to administer pain meds once ordered.

## 2022-02-11 NOTE — Plan of Care (Signed)
Patient transported from ER to 6M on the V60. Patient tolerated well, no issue. Report given to ICU RT.

## 2022-02-11 NOTE — Progress Notes (Signed)
RT note- Critical values of ABG given to Dr. Elsworth Soho, Bipap PRN

## 2022-02-11 NOTE — Progress Notes (Signed)
Admitted from ED for close monitoring d/t increased O2 requirements; Bipap 25% 12/6. Pt transferred from stretcher to bed by staff no complications. A&Ox4 follow commands moves all extremities. RASS 0 CAM neg. Chronic Atrial fib. BP stable. C/o 5/10 abdominal pain. Pt repositioned. Bladder scanned 296 ml of urine. Voiding encouraged UO 122m dark malodorous urine by bedpan. 2 PIVs saline locked and patent. Non blanchable redness noted to bilateral heels and sacrum. Protective foam in place. CHG bath given upon admission.    02/11/22 1200  Vitals  BP (!) 105/59  MAP (mmHg) 74  BP Location Right Arm  BP Method Automatic  Patient Position (if appropriate) Lying  Pulse Rate 81  Pulse Rate Source Monitor  ECG Heart Rate 87  Resp 11  Level of Consciousness  Level of Consciousness Alert  Oxygen Therapy  SpO2 94 %  O2 Device Bi-PAP  FiO2 (%) 25 %  MEWS Score  MEWS Temp 1  MEWS Systolic 0  MEWS Pulse 0  MEWS RR 1  MEWS LOC 0  MEWS Score 2  MEWS Score Color Yellow

## 2022-02-12 ENCOUNTER — Inpatient Hospital Stay (HOSPITAL_COMMUNITY): Payer: Medicare Other

## 2022-02-12 DIAGNOSIS — J9602 Acute respiratory failure with hypercapnia: Secondary | ICD-10-CM | POA: Diagnosis not present

## 2022-02-12 DIAGNOSIS — J9601 Acute respiratory failure with hypoxia: Secondary | ICD-10-CM | POA: Diagnosis not present

## 2022-02-12 DIAGNOSIS — J189 Pneumonia, unspecified organism: Secondary | ICD-10-CM | POA: Diagnosis not present

## 2022-02-12 LAB — BASIC METABOLIC PANEL
Anion gap: 8 (ref 5–15)
BUN: 23 mg/dL (ref 8–23)
CO2: 33 mmol/L — ABNORMAL HIGH (ref 22–32)
Calcium: 10.3 mg/dL (ref 8.9–10.3)
Chloride: 107 mmol/L (ref 98–111)
Creatinine, Ser: 0.67 mg/dL (ref 0.44–1.00)
GFR, Estimated: >60 mL/min (ref >60)
Glucose, Bld: 141 mg/dL — ABNORMAL HIGH (ref 70–99)
Potassium: 5 mmol/L (ref 3.5–5.1)
Sodium: 148 mmol/L — ABNORMAL HIGH (ref 135–145)

## 2022-02-12 LAB — POCT I-STAT 7, (LYTES, BLD GAS, ICA,H+H)
Acid-Base Excess: 6 mmol/L — ABNORMAL HIGH (ref 0.0–2.0)
Bicarbonate: 36.6 mmol/L — ABNORMAL HIGH (ref 20.0–28.0)
Calcium, Ion: 1.39 mmol/L (ref 1.15–1.40)
HCT: 42 % (ref 36.0–46.0)
Hemoglobin: 14.3 g/dL (ref 12.0–15.0)
O2 Saturation: 98 %
Patient temperature: 98.3
Potassium: 4.6 mmol/L (ref 3.5–5.1)
Sodium: 142 mmol/L (ref 135–145)
TCO2: 39 mmol/L — ABNORMAL HIGH (ref 22–32)
pCO2 arterial: 80.8 mmHg (ref 32–48)
pH, Arterial: 7.264 — ABNORMAL LOW (ref 7.35–7.45)
pO2, Arterial: 129 mmHg — ABNORMAL HIGH (ref 83–108)

## 2022-02-12 LAB — CBC
HCT: 46 % (ref 36.0–46.0)
Hemoglobin: 14.6 g/dL (ref 12.0–15.0)
MCH: 31.4 pg (ref 26.0–34.0)
MCHC: 31.7 g/dL (ref 30.0–36.0)
MCV: 98.9 fL (ref 80.0–100.0)
Platelets: 123 10*3/uL — ABNORMAL LOW (ref 150–400)
RBC: 4.65 MIL/uL (ref 3.87–5.11)
RDW: 14.4 % (ref 11.5–15.5)
WBC: 8.9 10*3/uL (ref 4.0–10.5)
nRBC: 0 % (ref 0.0–0.2)

## 2022-02-12 LAB — GLUCOSE, CAPILLARY
Glucose-Capillary: 119 mg/dL — ABNORMAL HIGH (ref 70–99)
Glucose-Capillary: 155 mg/dL — ABNORMAL HIGH (ref 70–99)
Glucose-Capillary: 107 mg/dL — ABNORMAL HIGH (ref 70–99)
Glucose-Capillary: 152 mg/dL — ABNORMAL HIGH (ref 70–99)

## 2022-02-12 LAB — URINE CULTURE

## 2022-02-12 MED ORDER — SIMETHICONE 80 MG PO CHEW
80.0000 mg | CHEWABLE_TABLET | Freq: Four times a day (QID) | ORAL | Status: DC | PRN
  Administered 2022-02-12 – 2022-02-13 (×2): 80 mg via ORAL
  Filled 2022-02-12 (×3): qty 1

## 2022-02-12 MED ORDER — PANTOPRAZOLE SODIUM 40 MG PO TBEC
40.0000 mg | DELAYED_RELEASE_TABLET | Freq: Two times a day (BID) | ORAL | Status: DC
Start: 2022-02-12 — End: 2022-02-13
  Administered 2022-02-12 – 2022-02-13 (×3): 40 mg via ORAL
  Filled 2022-02-12 (×3): qty 1

## 2022-02-12 MED ORDER — TRAZODONE HCL 50 MG PO TABS
50.0000 mg | ORAL_TABLET | Freq: Every day | ORAL | Status: DC
  Administered 2022-02-12: 50 mg via ORAL
  Filled 2022-02-12: qty 1

## 2022-02-12 MED ORDER — INSULIN GLARGINE-YFGN 100 UNIT/ML ~~LOC~~ SOLN
10.0000 [IU] | Freq: Every day | SUBCUTANEOUS | Status: DC
  Administered 2022-02-12: 10 [IU] via SUBCUTANEOUS
  Filled 2022-02-12 (×2): qty 0.1

## 2022-02-12 MED ORDER — INSULIN DEGLUDEC 100 UNIT/ML ~~LOC~~ SOPN: 10.0000 [IU] | PEN_INJECTOR | Freq: Every day | SUBCUTANEOUS | Status: DC

## 2022-02-12 MED ORDER — METOPROLOL SUCCINATE ER 25 MG PO TB24
12.5000 mg | ORAL_TABLET | Freq: Every day | ORAL | Status: DC
Start: 2022-02-12 — End: 2022-02-13
  Administered 2022-02-12 – 2022-02-13 (×2): 12.5 mg via ORAL
  Filled 2022-02-12 (×2): qty 1

## 2022-02-12 NOTE — Progress Notes (Signed)
Modified Barium Swallow Progress Note  Patient Details  Name: Julie Norton MRN: [id number] Date of Birth: [birth year]  Today's Date: 02/12/2022  Modified Barium Swallow completed.  Full report located under Chart Review in the Imaging Section.  Brief recommendations include the following:  Clinical Impression  Pt demonstrates severe esophageal dysphagia and moderate oropharyngeal impairment. Pt has generalized weakness impacting labial seal and lingual control with anterior spillage of PO and moderate oral residue. She is unable to sip from a straw. Pharyngeal dysphagia characterized by delayed swallow initiation, decreased epiglottic deflection and poor contraction of the lower pharyngeal constrictors. There is sensed penetration of nectar thick liquids with ejection and moderate pyriform sinus residue also with nectar. Pt is able to acheive better bolus cohesion and pharyngeal transit with honey thick liquids and puree. Esophageal sweep shows severe stasis after only liquids in the esophageal column. Pt Recommended to attempt honey thick liquids, pureed solids (due to generalized oral weakness as well as esophageal stasis). Pt also recommended to attempt to increase intake by constantly snacking on calorie dense moist soft foods and drinks rather than trying to eat full meals, which is not realistic given severy of esopahgeal stasis. Pt would have potential to upgrade if generalized strength improved. Doubtful that pt would be capable of completing an esophagram given severity of oral and pharyngeal dysphagia. Discussed with pt and daughter. Will f/u.   Swallow Evaluation Recommendations       SLP Diet Recommendations: Dysphagia 1 (Puree) solids;Honey thick liquids   Liquid Administration via: Spoon;Cup   Medication Administration: Crushed with puree       Compensations: Slow rate;Small sips/bites   Postural Changes: Seated upright at 90 degrees;Remain semi-upright after after  feeds/meals (Comment)   Oral Care Recommendations: Oral care BID   Other Recommendations: Have oral suction available    Shaheen Star, Katherene Ponto 02/12/2022,3:53 PM

## 2022-02-12 NOTE — Evaluation (Signed)
Physical Therapy Evaluation Patient Details Name: Julie Norton MRN: [id number] DOB: Sep 17, 1929 Today's Date: 02/12/2022  History of Present Illness  88 year old  admitted 8/8 with epigastric pain over 2 weeks especially with food with 35 pound weight loss.  Noted to be hypoxic, initially placed on nonrebreather then 4 L oxygen.  PMH:  non smoker, atrial fibrillation on chronic anticoagulation, diabetes mellitus type 2, hypothyroidism, and breast cancer s/p lumpectomy with radiation  Clinical Impression  Pt admitted with above diagnosis. Pt was able to ambulate a short distance with RW with mod assist of 2 persons as pt is deconditioned and weak at present time.  Pt needed O2 as well at rest and with activity. Discussed with pt and daughter that pt will benefit from SNF with therapy to progress to meet her goals.  Pt and daughter agree with plan.  Daughter is hopeful to get pt to A living after she completes SNF.  Pt currently with functional limitations due to the deficits listed below (see PT Problem List). Pt will benefit from skilled PT to increase their independence and safety with mobility to allow discharge to the venue listed below.          Recommendations for follow up therapy are one component of a multi-disciplinary discharge planning process, led by the attending physician.  Recommendations may be updated based on patient status, additional functional criteria and insurance authorization.  Follow Up Recommendations Skilled nursing-short term rehab (<3 hours/day) Can patient physically be transported by private vehicle: Yes    Assistance Recommended at Discharge Frequent or constant Supervision/Assistance  Patient can return home with the following  A lot of help with walking and/or transfers;A lot of help with bathing/dressing/bathroom;Assistance with cooking/housework;Assist for transportation;Help with stairs or ramp for entrance;Direct supervision/assist for medications  management    Equipment Recommendations None recommended by PT  Recommendations for Other Services       Functional Status Assessment Patient has had a recent decline in their functional status and demonstrates the ability to make significant improvements in function in a reasonable and predictable amount of time.     Precautions / Restrictions Precautions Precautions: Fall Precaution Comments: has a soft collar she wears as she has a "drop head" per daughter.  She wears it sideways, not sure of benefit. Restrictions Weight Bearing Restrictions: No      Mobility  Bed Mobility Overal bed mobility: Needs Assistance Bed Mobility: Supine to Sit     Supine to sit: Min assist     General bed mobility comments: a little assist to come to EOB with assist at trunk. Also cues and assist to scoot to EOB.    Transfers Overall transfer level: Needs assistance Equipment used: Rolling walker (2 wheels) Transfers: Sit to/from Stand Sit to Stand: Mod assist, +2 safety/equipment, From elevated surface           General transfer comment: Pt needed mod assist to stand to her feet and grip the RW.  Needed cues for hand placement as well.  STeadying assist needed once up as pt with posterior lean. Pt with very flexed posture which was premorbid per pt and daughter and cannot stand fully upright.    Ambulation/Gait Ambulation/Gait assistance: Mod assist, +2 safety/equipment Gait Distance (Feet): 30 Feet Assistive device: Rolling walker (2 wheels) Gait Pattern/deviations: Decreased step length - right, Decreased step length - left, Knee flexed in stance - right, Knee flexed in stance - left, Shuffle, Leaning posteriorly, Drifts right/left, Trunk flexed   Gait  velocity interpretation: <1.31 ft/sec, indicative of household ambulator   General Gait Details: Pt needed constant assist and cues to ambulate 30 feet with cues to stay close to rW, steer RW as well as to incr distance.  Pt maintains  flexed posture as stated above and has difficulty righting self even with tactile and verbal cues. Pt with posterior lean and generally unsteady needing mod assist and cues.  Stairs            Wheelchair Mobility    Modified Rankin (Stroke Patients Only)       Balance Overall balance assessment: Needs assistance Sitting-balance support: No upper extremity supported, Feet supported Sitting balance-Leahy Scale: Fair Sitting balance - Comments: can sit EOB without UE support   Standing balance support: Bilateral upper extremity supported, During functional activity, Reliant on assistive device for balance Standing balance-Leahy Scale: Poor Standing balance comment: relies on UE support for balance as well as external support                             Pertinent Vitals/Pain Pain Assessment Pain Assessment: No/denies pain    Home Living Family/patient expects to be discharged to:: Private residence Living Arrangements: Alone Available Help at Discharge: Family;Available PRN/intermittently;Personal care attendant (2 hours day, M-Sun recently; prior to that 3 days week) Type of Home: House Home Access: Stairs to enter   Entrance Stairs-Number of Steps: 1 + 1     Home Equipment: Rollator (4 wheels);Cane - single point;Shower seat      Prior Function Prior Level of Function : Independent/Modified Independent (havent driven since Feb, 2 falls in last 6 months)               ADLs Comments: B/D self. caregivers cooked and cleaned     Hand Dominance   Dominant Hand: Right    Extremity/Trunk Assessment   Upper Extremity Assessment Upper Extremity Assessment: Defer to OT evaluation    Lower Extremity Assessment Lower Extremity Assessment: Generalized weakness    Cervical / Trunk Assessment Cervical / Trunk Assessment: Kyphotic  Communication   Communication: No difficulties  Cognition Arousal/Alertness: Awake/alert Behavior During Therapy: WFL  for tasks assessed/performed Overall Cognitive Status: Within Functional Limits for tasks assessed                                          General Comments General comments (skin integrity, edema, etc.): 84 bpm, 99% 3L.  O2 did desat to 85% on RA therefore replaced at 3-4L with activity to maintain sats and left on 3L once in chair with sats 98%.  HR to 129 bpm with activity.    Exercises General Exercises - Lower Extremity Ankle Circles/Pumps: AROM, Both, 10 reps, Seated Long Arc Quad: AROM, Both, 10 reps, Seated   Assessment/Plan    PT Assessment Patient needs continued PT services  PT Problem List Decreased activity tolerance;Decreased balance;Decreased mobility;Decreased knowledge of use of DME;Decreased safety awareness;Decreased knowledge of precautions       PT Treatment Interventions DME instruction;Gait training;Functional mobility training;Therapeutic activities;Therapeutic exercise;Balance training;Patient/family education    PT Goals (Current goals can be found in the Care Plan section)  Acute Rehab PT Goals Patient Stated Goal: to go home PT Goal Formulation: With patient Time For Goal Achievement: 02/26/22 Potential to Achieve Goals: Good    Frequency Min 3X/week  Co-evaluation               AM-PAC PT "6 Clicks" Mobility  Outcome Measure Help needed turning from your back to your side while in a flat bed without using bedrails?: A Little Help needed moving from lying on your back to sitting on the side of a flat bed without using bedrails?: A Little Help needed moving to and from a bed to a chair (including a wheelchair)?: Total Help needed standing up from a chair using your arms (e.g., wheelchair or bedside chair)?: Total Help needed to walk in hospital room?: Total Help needed climbing 3-5 steps with a railing? : Total 6 Click Score: 10    End of Session Equipment Utilized During Treatment: Gait belt;Oxygen Activity Tolerance:  Patient limited by fatigue Patient left: in chair;with call bell/phone within reach;with chair alarm set;with family/visitor present Nurse Communication: Mobility status PT Visit Diagnosis: Muscle weakness (generalized) (M62.81)    Time: 0930-1008 PT Time Calculation (min) (ACUTE ONLY): 38 min   Charges:   PT Evaluation $PT Eval Moderate Complexity: 1 Mod PT Treatments $Gait Training: 8-22 mins $Therapeutic Exercise: 8-22 mins        [facility] M,PT Acute Rehab Services [place] 02/12/2022, 12:11 PM

## 2022-02-12 NOTE — Progress Notes (Signed)
eLink Physician-Brief Progress Note Patient Name: Julie Norton DOB: 09-Feb-1930 MRN: [id number]   Date of Service  02/12/2022  HPI/Events of Note  Patient c/o gas pain.   eICU Interventions  Plan: Simethicone 80 mg PO Q 6 hours PRN flatulence or gas pain.     Intervention Category Major Interventions: Other:  Kobee Medlen Cornelia Copa 02/12/2022, 5:29 AM

## 2022-02-12 NOTE — Progress Notes (Signed)
NAME:  Julie Norton, MRN:  [id number], DOB:  1929/12/21, LOS: 1 ADMISSION DATE:  02/21/2022, CONSULTATION DATE: 02/11/2022 REFERRING MD: [facility], CHIEF COMPLAINT: Acute hypercarbic respiratory failure   History of Present Illness:  88 year old female never smoker is admitted to the emergency room on 02/11/2022 with chief complaint of abdominal pain.  CT of the abdomen revealed multiple diverticula without acute process.  CT of the head shows no acute process.  While in the radiology she is found to be hypoxic  placed on nonrebreather.  She subsequently received fentanyl and morphine over 3 hours.  She became somnolent follow-up ABG pH 7.19 CO2 95 she is placed on on invasive mechanical ventilatory support she is now awake follow commands although she is extremely hard of hearing.  Due to her multiple comorbidities to be admitted to the intensive care unit and treated for pneumonia.  No further narcotics will be administered.   Pertinent  Medical History   Past Medical History:  Diagnosis Date   Abdominal adhesions    Abdominal gas pain    suspected -- may be related to intermittent right upper quadrant discomfort radiating around her back       Anemia    Anginal pain (HCC)    Arthritis    Atrial fibrillation (HCC)    Breast cancer (HCC)    Breast mass    left breast   Cancer ([place])    left breast   Chest pain    intermittent right upper quadrant discomfort radiating around her back       Complication of anesthesia    Diabetes mellitus without complication (HCC)    Type II   Dyspepsia    Dyspnea    GERD (gastroesophageal reflux disease)    H/O: hysterectomy 1978   History of cardioversion    Hypercholesteremia    Hypothyroidism    Mitral insufficiency    Personal history of radiation therapy    Pneumonia    PONV (postoperative nausea and vomiting)    Right bundle branch block (RBBB)    Tendinitis    of the right arm and shoulder   Torn tendon    right shoulder      Significant Hospital Events: Including procedures, antibiotic start and stop dates in addition to other pertinent events   02/11/2022 being placed on noninvasive ventilatory support  Interim History / Subjective:  Presented with abdominal pain found to be hypoxic after being treated with narcotics x 2 was found to be hypercarbic.  Objective   Blood pressure 127/72, pulse 89, temperature 97.8 F (36.6 C), temperature source Oral, resp. rate 16, height '5\' 1"'$  (1.549 m), weight 46.3 kg, SpO2 100 %.    FiO2 (%):  [25 %-35 %] 25 %   Intake/Output Summary (Last 24 hours) at 02/12/2022 4765 Last data filed at 02/12/2022 0700 Gross per 24 hour  Intake 102.91 ml  Output 300 ml  Net -197.09 ml   Filed Weights   02/27/2022 1937  Weight: 46.3 kg    Examination:  General: Elderly female, thin and frail, NAD HENT: [place] Lungs: Good air movement bilaterally, breathing comfortably on [place] Cardiovascular: Heart sounds are regular with a regular rhythm Abdomen: soft, Positive bowel sounds, not tender to palpation Extremities: 1+ edema Neuro: Alert and oriented, follows commands, interactive, no nystagmus, hoarse voice, speech fluent and intact, no focal deficit, strength intact, reflexes symmetric and intact.  Resolved Hospital Problem list     Assessment & Plan:   Acute on chronic  hypercarbic hypoxic respiratory failure  Acute component likely related to narcotic administration. Blood gas improved s/p BiPAP.  Placed on  overnight.  Mentation and resp status improved today. ABG today shows chronic CO2 retention. Given her reported dysphagia and dysphonia, concern that neuromuscular process amy be driving chronic CO2 retention.   - hold narcotics - BiPAP QHS and PRN - monitor NIF BID - O2 as needed to keep sats greater than 90% - Bronchodilators PRN  Possible left lower lobe pneumonia   Cultures negative thus far. - continue to follow cultures - continue abx  Chronic atrial  fibrillation  Multiple cardioversions in the past currently on Eliquis. - Cardiac monitoring - Currently on Eliquis  Chest pain  History of right bundle branch block. Troponins negative. EKG unchanged from prior. Troponins flat.  - no further workup - monitor  Abdominal pain Patient had CT abdomen which was largely negative. Clinically suspicious for chronic mesenteric ischemia. Discussed with radiology findings on recent CT abd and the value of CTA for further evaluation of this pain. They did note some plaque at celiac origin on previous imaging, but no further changes beyond what would be expected for pt of this age. CTA felt to be of little benefit and did not recommend further imaging.  - no further workup  Dysphagia Dysphonia  In the context of chronic hypercarbia, this finding is suspicious for neuromuscular problem.  - SLP consult - MBSS  Diabetes mellitus Sliding-scale insulin protocol   Best Practice (right click and "Reselect all SmartList Selections" daily)   Diet/type: NPO DVT prophylaxis: DOAC GI prophylaxis: PPI Lines: N/A Foley:  N/A Code Status:  full code Last date of multidisciplinary goals of care discussion [tbd]  Critical care time: 68 min    Delene Ruffini, MD

## 2022-02-12 NOTE — Evaluation (Signed)
Clinical/Bedside Swallow Evaluation Patient Details  Name: Julie Norton MRN: [id number] Date of Birth: 06/02/1930  Today's Date: 02/12/2022 Time: SLP Start Time (ACUTE ONLY): 52 SLP Stop Time (ACUTE ONLY): 1100 SLP Time Calculation (min) (ACUTE ONLY): 30 min  Past Medical History:  Past Medical History:  Diagnosis Date   Abdominal adhesions    Abdominal gas pain    suspected -- may be related to intermittent right upper quadrant discomfort radiating around her back       Anemia    Anginal pain (HCC)    Arthritis    Atrial fibrillation (HCC)    Breast cancer (HCC)    Breast mass    left breast   Cancer ([place])    left breast   Chest pain    intermittent right upper quadrant discomfort radiating around her back       Complication of anesthesia    Diabetes mellitus without complication (HCC)    Type II   Dyspepsia    Dyspnea    GERD (gastroesophageal reflux disease)    H/O: hysterectomy 1978   History of cardioversion    Hypercholesteremia    Hypothyroidism    Mitral insufficiency    Personal history of radiation therapy    Pneumonia    PONV (postoperative nausea and vomiting)    Right bundle branch block (RBBB)    Tendinitis    of the right arm and shoulder   Torn tendon    right shoulder   Past Surgical History:  Past Surgical History:  Procedure Laterality Date   ABDOMINAL HYSTERECTOMY     APPENDECTOMY  1978   BLADDER REPAIR     BREAST LUMPECTOMY Left 02/24/2011   central partial mastectomy - dr Margot Chimes   BREAST REDUCTION SURGERY     CARDIOVERSION N/A 10/28/2017   Procedure: CARDIOVERSION;  Surgeon: Lelon Perla, MD;  Location: [place];  Service: Cardiovascular;  Laterality: N/A;   CARDIOVERSION N/A 04/06/2019   Procedure: CARDIOVERSION;  Surgeon: [name], MD;  Location: [facility] ENDOSCOPY;  Service: Cardiovascular;  Laterality: N/A;   CARDIOVERSION N/A 09/08/2019   Procedure: CARDIOVERSION;  Surgeon: Dorothy Spark, MD;  Location: [facility]  ENDOSCOPY;  Service: Cardiovascular;  Laterality: N/A;   CARDIOVERSION N/A 08/28/2021   Procedure: CARDIOVERSION;  Surgeon: [name], MD;  Location: [place];  Service: Cardiovascular;  Laterality: N/A;   COLONOSCOPY WITH PROPOFOL N/A 03/24/2017   Procedure: COLONOSCOPY WITH PROPOFOL;  Surgeon: Wilford Corner, MD;  Location[facility] ENDOSCOPY;  Service: Endoscopy;  Laterality: N/A;   ESOPHAGOGASTRODUODENOSCOPY (EGD) WITH PROPOFOL N/A 03/24/2017   Procedure: ESOPHAGOGASTRODUODENOSCOPY (EGD) WITH PROPOFOL;  Surgeon: Wilford Corner, MD;  Location: [place];  Service: Endoscopy;  Laterality: N/A;   LIPOMA EXCISION Right    REDUCTION MAMMAPLASTY Bilateral 1998   RETINAL DETACHMENT SURGERY Left    SALPINGOOPHORECTOMY     TEE WITHOUT CARDIOVERSION N/A 10/28/2017   Procedure: TRANSESOPHAGEAL ECHOCARDIOGRAM (TEE);  Surgeon: Lelon Perla, MD;  Location[facility] ENDOSCOPY;  Service: Cardiovascular;  Laterality: N/A;   [place]   at 88 years of age   HPI:  Julie Norton is a 88 y.o. female who presented with reports of epigastric pain over the last 2-3 weeks. She had associated symptoms of nausea, chest pain, fatigue, decreased p.o. intake, constipation, and shortness of breath with exertion.  Since December of last year patient had lost over 35 pounds and has been drinking boost drinks over the last few months. Pt reports early satiety. Hx  of breast cancer with radiation. Found to have acute respiratory failure with hypoxia and hypercapnia.  Chest x-ray gave concern for increased left lung opacity thought to be atelectasis versus pneumonia.  CT however did not note any lung abnormality. 2018 endoscopy shows normal esophagus.    Assessment / Plan / Recommendation  Clinical Impression  Pt demonstrates immedate throat clearing and bleching, audible swallow. There is concern for a cervical esophageal dysphagia given that her weight loss and trouble iwth  adequate intake occurred around the time of her head drop and also voice change. Pt had significant difficulty generating adequate cough strength and could not achieve negative pressure of straw sip. Seemed to do much better with puree texture and she feels that thicker consistencies are easier. MD made plans for esophagram, but given the difficlty she demonstrates with only minimal intake, she may not be capable of full participation with esophagram. Will attempt MBS first. SLP Visit Diagnosis: Dysphagia, pharyngoesophageal phase (R13.14)    Aspiration Risk  Moderate aspiration risk;Risk for inadequate nutrition/hydration    Diet Recommendation Regular;Thin liquid   Liquid Administration via: Cup;Straw Medication Administration: Crushed with puree Supervision: Patient able to self feed Compensations: Slow rate;Small sips/bites Postural Changes: Seated upright at 90 degrees    Other  Recommendations      Recommendations for follow up therapy are one component of a multi-disciplinary discharge planning process, led by the attending physician.  Recommendations may be updated based on patient status, additional functional criteria and insurance authorization.  Follow up Recommendations        Assistance Recommended at Discharge    Functional Status Assessment    Frequency and Duration            Prognosis        Swallow Study   General HPI: Julie Norton is a 88 y.o. female who presented with reports of epigastric pain over the last 2-3 weeks. She had associated symptoms of nausea, chest pain, fatigue, decreased p.o. intake, constipation, and shortness of breath with exertion.  Since December of last year patient had lost over 35 pounds and has been drinking boost drinks over the last few months. Pt reports early satiety. Hx of breast cancer with radiation. Found to have acute respiratory failure with hypoxia and hypercapnia.  Chest x-ray gave concern for increased left lung  opacity thought to be atelectasis versus pneumonia.  CT however did not note any lung abnormality. 2018 endoscopy shows normal esophagus. Type of Study: Bedside Swallow Evaluation Diet Prior to this Study: Regular;Thin liquids Temperature Spikes Noted: No Respiratory Status: Room air History of Recent Intubation: No Behavior/Cognition: Alert;Cooperative Oral Cavity Assessment: Within Functional Limits Oral Care Completed by SLP: No Oral Cavity - Dentition: Adequate natural dentition Vision: Functional for self-feeding Self-Feeding Abilities: Needs assist Patient Positioning: Upright in chair Baseline Vocal Quality: Hoarse Volitional Cough: Weak    Oral/Motor/Sensory Function Overall Oral Motor/Sensory Function: Within functional limits   Ice Chips     Thin Liquid Thin Liquid: Impaired Presentation: Straw;Spoon;Cup Oral Phase Impairments: Reduced labial seal;Reduced lingual movement/coordination Pharyngeal  Phase Impairments: Multiple swallows;Throat Clearing - Immediate (belching)    Nectar Thick Nectar Thick Liquid: Not tested   Honey Thick Honey Thick Liquid: Not tested   Puree Puree: Within functional limits   Solid     Solid: Not tested      Lynann Beaver 02/12/2022,11:28 AM

## 2022-02-12 NOTE — Progress Notes (Signed)
Pt NIF -20 w/ poor effort 3x. Pt states she is tired and requesting to go back on the biPAP. Pt placed back on the biPAP and resting comfortably. RT will cont to monitor as needed.

## 2022-02-12 NOTE — Progress Notes (Signed)
PROGRESS NOTE    Julie Norton  FHL:[id number] DOB: 1930-02-28 DOA: 02/04/2022 PCP: Eulas Post, MD    Brief Narrative:  88 year old female with history of chronic A-fib, history of breast cancer status post chemoradiation in 2014, dyspepsia, GERD, type 2 diabetes, hyperlipidemia, hypothyroidism, aortic stenosis status post TAVR presented to the emergency room with ongoing epigastric pain for last 2 to 3 weeks, weight loss, early satiety and lethargy.  Recently received Botox injection for torticollis on her neck.  Does not have a history of COPD.  In the emergency room she was afebrile, oxygen dropped to 75% requiring 4 L oxygen.  She was given fentanyl doses for abdominal pain and subsequently developed hypercapnia needing BiPAP and admission to ICU.  Chest x-ray also showed increased opacity of the left lung base thought to be atelectasis or pneumonia.  CT scan abdomen pelvis did not show any intra-abdominal pathology.   Assessment & Plan:   Acute on chronic hypercapnic and hypoxemic respiratory failure: Patient probably has chronic hypercapnic respiratory failure.] Admitted with CO2 retention and mental status changes improved with BiPAP overnight.  pH and CO2 improved slightly.  Mental status has improved.  Probably aggravated by aspiration/left lower lobe pneumonia. -Given oxygen to keep saturation more than 90%.  Aggressive chest physiotherapy.  Incentive spirometry and mobility today. -BiPAP as needed and at nights. -Treating as community-acquired pneumonia with Rocephin, cultures negative so far. -Speech therapy to rule out oropharyngeal dysphagia, aspiration pneumonia.  Atypical chest pain/epigastric pain/dyspepsia and early satiety: Significant symptoms. EGD and colonoscopy 2018 was essentially normal.  Also on anticoagulation.  Hemoglobin is stable. Abdominal CT scan was largely unremarkable.  Suspected chronic mesenteric ischemia, however radiology recommended that CT  angiogram will not add much to diagnosis. Will start patient on PPI twice daily, antacids and monitor for symptoms.  Currently with respiratory compromise, will not suggest upper GI endoscopy unless symptoms persist. Barium swallow study Barium esophagogram today.  Type 2 diabetes: On insulin at home.  Will resume.  Hypothyroidism: On Synthroid.  Resume.  Chronic A-fib: Rate controlled.  Therapeutic on Eliquis.Resume metoprolol.     DVT prophylaxis: apixaban (ELIQUIS) tablet 2.5 mg Start: 02/11/22 1000 apixaban (ELIQUIS) tablet 2.5 mg   Code Status: DNR Family Communication: Daughter at the bedside Disposition Plan: Status is: Inpatient Remains inpatient appropriate because: Needing BiPAP.  IV antibiotics.     Consultants:  Critical care   Procedures:  None  Antimicrobials:  Rocephin 8/9---   Subjective: Patient was seen and examined.  Overnight events noted.  She wore BiPAP for few hours and woke up.  Currently describes early satiety, diffuse xiphisternal pain, dry cough.  Daughter at the bedside.  Difficulty swallowing with the neck positioning on collar.  Afebrile overnight.  Objective: Vitals:   02/12/22 1000 02/12/22 1100 02/12/22 1132 02/12/22 1200  BP:  129/63  (!) 123/57  Pulse: (!) 105 98  100  Resp: 17 (!) 27  19  Temp:   (!) 97.3 F (36.3 C)   TempSrc:   Axillary   SpO2:    98%  Weight:      Height:        Intake/Output Summary (Last 24 hours) at 02/12/2022 1415 Last data filed at 02/12/2022 0700 Gross per 24 hour  Intake 99.91 ml  Output 300 ml  Net -200.09 ml   Filed Weights   03/02/2022 1937  Weight: 46.3 kg    Examination:  General exam: Appears calm, frail and debilitated.  On 4 L  oxygen. Respiratory system: Mostly conducted upper airway sounds.  Currently remains fairly comfortable on additional oxygen. Cardiovascular system: S1 & S2 heard, irregularly irregular. Gastrointestinal system: Abdomen is nondistended, soft and mild  epigastric tenderness. No organomegaly or masses felt. Normal bowel sounds heard. Central nervous system: Alert and oriented. No focal neurological deficits. Generalized weakness.   Data Reviewed: I have personally reviewed following labs and imaging studies  CBC: Recent Labs  Lab 02/25/2022 1945 02/11/22 0153 02/11/22 0501 02/11/22 0831 02/11/22 1626 02/12/22 0835 02/12/22 1054  WBC 7.5  --   --   --   --  8.9  --   NEUTROABS 5.5  --   --   --   --   --   --   HGB 16.0*   < > 12.9 14.3 13.9 14.6 14.3  HCT 47.1*   < > 38.0 42.0 41.0 46.0 42.0  MCV 94.4  --   --   --   --  98.9  --   PLT 129*  --   --   --   --  123*  --    < > = values in this interval not displayed.   Basic Metabolic Panel: Recent Labs  Lab 02/10/2022 1945 02/11/22 0153 02/11/22 0501 02/11/22 0831 02/11/22 1040 02/11/22 1626 02/12/22 0835 02/12/22 1054  NA 141   < > 141 142  --  143 148* 142  K 4.3   < > 4.4 4.6  --  4.2 5.0 4.6  CL 102  --   --   --   --   --  107  --   CO2 32  --   --   --   --   --  33*  --   GLUCOSE 171*  --   --   --   --   --  141*  --   BUN 18  --   --   --   --   --  23  --   CREATININE 0.60  --   --   --   --   --  0.67  --   CALCIUM 10.1  --   --   --   --   --  10.3  --   MG  --   --   --   --  1.8  --   --   --    < > = values in this interval not displayed.   GFR: Estimated Creatinine Clearance: 34.8 mL/min (by C-G formula based on SCr of 0.67 mg/dL). Liver Function Tests: Recent Labs  Lab 02/08/2022 1945  AST 21  ALT 17  ALKPHOS 53  BILITOT 0.9  PROT 6.1*  ALBUMIN 3.6   Recent Labs  Lab 02/13/2022 1945  LIPASE 39   Recent Labs  Lab 02/11/22 0255  AMMONIA 24   Coagulation Profile: Recent Labs  Lab 02/11/22 0140  INR 1.4*   Cardiac Enzymes: No results for input(s): "CKTOTAL", "CKMB", "CKMBINDEX", "TROPONINI" in the last 168 hours. BNP (last 3 results) No results for input(s): "PROBNP" in the last 8760 hours. HbA1C: No results for input(s): "HGBA1C"  in the last 72 hours. CBG: Recent Labs  Lab 02/11/22 1112 02/11/22 1526 02/11/22 2314 02/12/22 0736 02/12/22 1130  GLUCAP 144* 82 84 119* 155*   Lipid Profile: No results for input(s): "CHOL", "HDL", "LDLCALC", "TRIG", "CHOLHDL", "LDLDIRECT" in the last 72 hours. Thyroid Function Tests: No results for input(s): "TSH", "T4TOTAL", "FREET4", "T3FREE", "THYROIDAB" in  the last 72 hours. Anemia Panel: No results for input(s): "VITAMINB12", "FOLATE", "FERRITIN", "TIBC", "IRON", "RETICCTPCT" in the last 72 hours. Sepsis Labs: Recent Labs  Lab 02/19/2022 1945 02/11/22 0140 02/11/22 0354  PROCALCITON <0.10  --   --   LATICACIDVEN  --  1.4 1.3    Recent Results (from the past 240 hour(s))  Urine Culture     Status: None (Preliminary result)   Collection Time: 02/11/22 12:22 AM   Specimen: In/Out Cath Urine  Result Value Ref Range Status   Specimen Description IN/OUT CATH URINE  Final   Special Requests NONE  Final   Culture   Final    CULTURE REINCUBATED FOR BETTER GROWTH Performed at [facility], [place] [address] [place], [place] [address]    Report Status PENDING  Incomplete  Blood Culture (routine x 2)     Status: None (Preliminary result)   Collection Time: 02/11/22  1:40 AM   Specimen: BLOOD  Result Value Ref Range Status   Specimen Description BLOOD SITE NOT SPECIFIED  Final   Special Requests   Final    BOTTLES DRAWN AEROBIC AND ANAEROBIC Blood Culture results may not be optimal due to an inadequate volume of blood received in culture bottles   Culture   Final    NO GROWTH 1 DAY Performed at [facility], [place] [address] [place], [place] [address]    Report Status PENDING  Incomplete  Blood Culture (routine x 2)     Status: None (Preliminary result)   Collection Time: 02/11/22  1:40 AM   Specimen: BLOOD  Result Value Ref Range Status   Specimen Description BLOOD SITE NOT SPECIFIED  Final   Special Requests   Final    BOTTLES DRAWN AEROBIC AND ANAEROBIC  Blood Culture results may not be optimal due to an inadequate volume of blood received in culture bottles   Culture   Final    NO GROWTH 1 DAY Performed at [facility], [place] [address], [place], [place] [address]    Report Status PENDING  Incomplete  SARS Coronavirus 2 by RT PCR (hospital order, performed in [facility] hospital lab) *cepheid single result test* Anterior Nasal Swab     Status: None   Collection Time: 02/11/22  5:35 AM   Specimen: Anterior Nasal Swab  Result Value Ref Range Status   SARS Coronavirus 2 by RT PCR NEGATIVE NEGATIVE Final    Comment: (NOTE) SARS-CoV-2 target nucleic acids are NOT DETECTED.  The SARS-CoV-2 RNA is generally detectable in upper and lower respiratory specimens during the acute phase of infection. The lowest concentration of SARS-CoV-2 viral copies this assay can detect is 250 copies / mL. A negative result does not preclude SARS-CoV-2 infection and should not be used as the sole basis for treatment or other patient management decisions.  A negative result may occur with improper specimen collection / handling, submission of specimen other than nasopharyngeal swab, presence of viral mutation(s) within the areas targeted by this assay, and inadequate number of viral copies (<250 copies / mL). A negative result must be combined with clinical observations, patient history, and epidemiological information.  Fact Sheet for Patients:   https://www.patel.info/  Fact Sheet for Healthcare Providers: https://hall.com/  This test is not yet approved or  cleared by the [place] FDA and has been authorized for detection and/or diagnosis of SARS-CoV-2 by FDA under an Emergency Use Authorization (EUA).  This EUA will remain in effect (meaning this  test can be used) for the duration of the COVID-19 declaration under Section 564(b)(1) of the Act, 21 U.S.C. section 360bbb-3(b)(1), unless the authorization  is terminated or revoked sooner.  Performed at [facility], [place] [address], [place], [place] [address]   Culture, blood (Routine X 2) w Reflex to ID Panel     Status: None (Preliminary result)   Collection Time: 02/11/22 10:36 AM   Specimen: BLOOD  Result Value Ref Range Status   Specimen Description BLOOD SITE NOT SPECIFIED  Final   Special Requests   Final    BOTTLES DRAWN AEROBIC AND ANAEROBIC Blood Culture adequate volume   Culture   Final    NO GROWTH < 24 HOURS Performed at [facility], [place] [address], [place], [place] [address]    Report Status PENDING  Incomplete  Culture, blood (Routine X 2) w Reflex to ID Panel     Status: None (Preliminary result)   Collection Time: 02/11/22 10:42 AM   Specimen: BLOOD  Result Value Ref Range Status   Specimen Description BLOOD SITE NOT SPECIFIED  Final   Special Requests   Final    BOTTLES DRAWN AEROBIC AND ANAEROBIC Blood Culture results may not be optimal due to an inadequate volume of blood received in culture bottles   Culture   Final    NO GROWTH < 24 HOURS Performed at [facility], [place] [address], [place], [place] [address]    Report Status PENDING  Incomplete  MRSA Next Gen by PCR, Nasal     Status: None   Collection Time: 02/11/22 12:29 PM   Specimen: Nasal Mucosa; Nasal Swab  Result Value Ref Range Status   MRSA by PCR Next Gen NOT DETECTED NOT DETECTED Final    Comment: (NOTE) The GeneXpert MRSA Assay (FDA approved for NASAL specimens only), is one component of a comprehensive MRSA colonization surveillance program. It is not intended to diagnose MRSA infection nor to guide or monitor treatment for MRSA infections. Test performance is not FDA approved in patients less than 28 years old. Performed at [facility], [place] [address] [place],  [address]          Radiology Studies: ECHOCARDIOGRAM COMPLETE  Result Date: 02/11/2022    ECHOCARDIOGRAM REPORT   Patient Name:   Julie Norton Date of Exam: 02/11/2022 Medical Rec #:  [id number]         Height:       61.0 in Accession #:    [id number]        Weight:       102.0 lb Date of Birth:  21-Nov-1929         BSA:          1.419 m Patient Age:    59 years          BP:           140/66 mmHg Patient Gender: F                 HR:           70 bpm. Exam Location:  Inpatient Procedure: 2D Echo, Cardiac Doppler and Color Doppler Indications:    Abnormal ECG  History:        Patient has prior history of Echocardiogram examinations, most                 recent 09/14/2020. Arrythmias:Atrial Fibrillation; Risk  Factors:Diabetes and Dyslipidemia.  Sonographer:    Maudry Mayhew MHA, RDMS, RVT, RDCS Referring Phys: [place] Comments: On C-PAP. IMPRESSIONS  1. Left ventricular ejection fraction by 3D volume is 51 %. The left ventricle has low normal function. The left ventricle has no regional wall motion abnormalities. Left ventricular diastolic parameters are indeterminate.  2. Right ventricular systolic function is normal. The right ventricular size is normal.  3. Left atrial size was mildly dilated.  4. Right atrial size was mildly dilated.  5. The mitral valve is normal in structure. Trivial mitral valve regurgitation. No evidence of mitral stenosis.  6. Tricuspid valve regurgitation is moderate.  7. The aortic valve is tricuspid. Aortic valve regurgitation is not visualized. No aortic stenosis is present.  8. The inferior vena cava is normal in size with greater than 50% respiratory variability, suggesting right atrial pressure of 3 mmHg. FINDINGS  Left Ventricle: Left ventricular ejection fraction by 3D volume is 51 %. The left ventricle has low normal function. The left ventricle has no regional wall motion abnormalities. The left ventricular internal cavity size was normal in size. There is no left ventricular hypertrophy. Left ventricular diastolic parameters are indeterminate. Right Ventricle: The right  ventricular size is normal. No increase in right ventricular wall thickness. Right ventricular systolic function is normal. Left Atrium: Left atrial size was mildly dilated. Right Atrium: Right atrial size was mildly dilated. Pericardium: There is no evidence of pericardial effusion. Mitral Valve: The mitral valve is normal in structure. Trivial mitral valve regurgitation. No evidence of mitral valve stenosis. Tricuspid Valve: The tricuspid valve is normal in structure. Tricuspid valve regurgitation is moderate . No evidence of tricuspid stenosis. Aortic Valve: The aortic valve is tricuspid. Aortic valve regurgitation is not visualized. No aortic stenosis is present. Aortic valve mean gradient measures 2.0 mmHg. Aortic valve peak gradient measures 3.2 mmHg. Aortic valve area, by VTI measures 2.00 cm. Pulmonic Valve: The pulmonic valve was normal in structure. Pulmonic valve regurgitation is not visualized. No evidence of pulmonic stenosis. Aorta: The aortic root is normal in size and structure. Venous: The inferior vena cava is normal in size with greater than 50% respiratory variability, suggesting right atrial pressure of 3 mmHg. IAS/Shunts: No atrial level shunt detected by color flow Doppler.  LEFT VENTRICLE PLAX 2D LVIDd:         3.95 cm LVIDs:         2.60 cm LV PW:         0.75 cm         3D Volume EF LV IVS:        0.85 cm         LV 3D EF:    Left LVOT diam:     1.60 cm                      ventricul LV SV:         31                           ar LV SV Index:   22                           ejection LVOT Area:     2.01 cm  fraction                                             by 3D                                             volume is LV Volumes (MOD)                            51 %. LV vol d, MOD    65.3 ml A2C: LV vol d, MOD    28.7 ml       3D Volume EF: A4C:                           3D EF:        51 % LV vol s, MOD    30.4 ml       LV EDV:       98 ml A2C:                            LV ESV:       47 ml LV vol s, MOD    9.8 ml        LV SV:        50 ml A4C: LV SV MOD A2C:   34.9 ml LV SV MOD A4C:   28.7 ml LV SV MOD BP:    27.5 ml RIGHT VENTRICLE RV Basal diam:  4.20 cm RV Mid diam:    4.30 cm RV S prime:     11.70 cm/s TAPSE (M-mode): 1.4 cm LEFT ATRIUM             Index        RIGHT ATRIUM           Index LA diam:        3.30 cm 2.33 cm/m   RA Area:     17.00 cm LA Vol (A2C):   55.2 ml 38.91 ml/m  RA Volume:   44.90 ml  31.65 ml/m LA Vol (A4C):   44.4 ml 31.30 ml/m LA Biplane Vol: 50.7 ml 35.74 ml/m  AORTIC VALVE AV Area (Vmax):    1.86 cm AV Area (Vmean):   1.82 cm AV Area (VTI):     2.00 cm AV Vmax:           90.10 cm/s AV Vmean:          62.700 cm/s AV VTI:            0.155 m AV Peak Grad:      3.2 mmHg AV Mean Grad:      2.0 mmHg LVOT Vmax:         83.40 cm/s LVOT Vmean:        56.600 cm/s LVOT VTI:          0.154 m LVOT/AV VTI ratio: 0.99  AORTA Ao Root diam: 3.20 cm MR Peak grad:    31.6 mmHg    TRICUSPID VALVE MR Vmax:         281.00 cm/s  TR Peak grad:   31.8 mmHg MR PISA:  0.57 cm     TR Vmax:        282.00 cm/s MR PISA Eff ROA: 6 mm MR PISA Radius:  0.30 cm      SHUNTS                               Systemic VTI:  0.15 m                               Systemic Diam: 1.60 cm Kardie Tobb DO Electronically signed by Berniece Salines DO Signature Date/Time: 02/11/2022/5:02:34 PM    Final    CT HEAD WO CONTRAST  Result Date: 02/11/2022 CLINICAL DATA:  88 year old female with delirium.  Abdominal pain. EXAM: CT HEAD WITHOUT CONTRAST TECHNIQUE: Contiguous axial images were obtained from the base of the skull through the vertex without intravenous contrast. RADIATION DOSE REDUCTION: This exam was performed according to the departmental dose-optimization program which includes automated exposure control, adjustment of the mA and/or kV according to patient size and/or use of iterative reconstruction technique. COMPARISON:  Head CT 02/19/2020.  Brain MRI 08/29/2003. FINDINGS:  Brain: Cerebral volume is within normal limits for age. No midline shift, ventriculomegaly, mass effect, evidence of mass lesion, intracranial hemorrhage or evidence of cortically based acute infarction. Gray-white matter differentiation appears stable and within normal limits for age. No cortical encephalomalacia identified. Vascular: Calcified atherosclerosis at the skull base. No suspicious intracranial vascular hyperdensity. Skull: No acute osseous abnormality identified. Sinuses/Orbits: Tympanic cavities, Visualized paranasal sinuses and mastoids are clear. Other: No acute orbit or scalp soft tissue finding. IMPRESSION: No acute intracranial abnormality. Stable and within normal limits for age. Electronically Signed   By: Genevie Ann M.D.   On: 02/11/2022 04:56   CT ABDOMEN PELVIS W CONTRAST  Result Date: 03/01/2022 CLINICAL DATA:  Epigastric abdominal pain EXAM: CT ABDOMEN AND PELVIS WITH CONTRAST TECHNIQUE: Multidetector CT imaging of the abdomen and pelvis was performed using the standard protocol following bolus administration of intravenous contrast. RADIATION DOSE REDUCTION: This exam was performed according to the departmental dose-optimization program which includes automated exposure control, adjustment of the mA and/or kV according to patient size and/or use of iterative reconstruction technique. CONTRAST:  173m OMNIPAQUE IOHEXOL 300 MG/ML  SOLN COMPARISON:  None Available. FINDINGS: Lower chest: The visualized lung bases are clear. Mild global cardiomegaly. Hepatobiliary: Moderate hepatic steatosis. No focal enhancing intrahepatic mass identified. No intra or extrahepatic biliary ductal dilation. Gallbladder unremarkable. Pancreas: Unremarkable Spleen: The spleen is normal in size. Large splenorenal shunt identified. Adrenals/Urinary Tract: Adrenal glands are unremarkable. Kidneys are normal, without renal calculi, focal lesion, or hydronephrosis. Bladder is unremarkable. Stomach/Bowel: Moderate  diverticulosis of the sigmoid colon without superimposed acute inflammatory change. The stomach, small bowel, and large bowel are otherwise unremarkable. Appendix absent. No free intraperitoneal gas or fluid. Vascular/Lymphatic: Aortic atherosclerosis. No enlarged abdominal or pelvic lymph nodes. Reproductive: Status post hysterectomy. No adnexal masses. Other: No abdominal wall hernia Musculoskeletal: Degenerative changes seen the lumbar spine with superimposed lumbar levoscoliosis. No acute bone abnormality. No lytic or blastic bone lesion. IMPRESSION: 1. No acute intra-abdominal pathology identified. No definite radiographic explanation for the patient's reported symptoms. 2. Moderate hepatic steatosis. 3. Large splenorenal shunt, suggesting portal venous hypertension with portosystemic collateralization. This is unchanged from remote prior examination of 02/22/2017. 4. Moderate sigmoid diverticulosis without superimposed acute inflammatory change. Aortic Atherosclerosis (ICD10-I70.0). Electronically Signed  By: Fidela [place] M.D.   On: 02/05/2022 22:51   DG Chest Portable 1 View  Result Date: 03/02/2022 CLINICAL DATA:  Shortness of breath and epigastric pain. EXAM: PORTABLE CHEST 1 VIEW COMPARISON:  01/09/2022 PA Lat FINDINGS: Mild cardiomegaly. No vascular congestion is seen. Stable mediastinal configuration with aortic tortuosity and patchy calcification. Lungs are expiratory. There is asymmetric increased opacity left base which could be simple atelectasis or left basilar pneumonia. Remaining hypoaerated lungs are generally clear. The sulci are sharp. There is osteopenia, degenerative change and dextroscoliosis of thoracic spine. IMPRESSION: 1. Expiratory exam with asymmetric increased opacity left base which could be atelectasis or pneumonia. Follow-up study recommended in full inspiration. 2. Cardiomegaly. 3. Aortic atherosclerosis. 4. Scoliosis and degenerative change. Electronically Signed   By:  Telford Nab M.D.   On: 02/11/2022 20:43        Scheduled Meds:  apixaban  2.5 mg Oral BID   Chlorhexidine Gluconate Cloth  6 each Topical Daily   insulin aspart  0-15 Units Subcutaneous TID WC   levothyroxine  50 mcg Oral Q0600   mouth rinse  15 mL Mouth Rinse 4 times per day   pantoprazole  40 mg Oral BID   sodium chloride flush  3 mL Intravenous Q12H   Continuous Infusions:  cefTRIAXone (ROCEPHIN)  IV Stopped (02/12/22 0600)     LOS: 1 day    Time spent: 35 minutes    Barb Merino, MD [facility] Pager [phone]

## 2022-02-13 ENCOUNTER — Telehealth: Payer: Medicare Other

## 2022-02-13 DIAGNOSIS — Z66 Do not resuscitate: Secondary | ICD-10-CM | POA: Diagnosis not present

## 2022-02-13 DIAGNOSIS — J189 Pneumonia, unspecified organism: Secondary | ICD-10-CM | POA: Diagnosis not present

## 2022-02-13 DIAGNOSIS — Z7189 Other specified counseling: Secondary | ICD-10-CM

## 2022-02-13 DIAGNOSIS — Z515 Encounter for palliative care: Secondary | ICD-10-CM

## 2022-02-13 DIAGNOSIS — J9602 Acute respiratory failure with hypercapnia: Secondary | ICD-10-CM | POA: Diagnosis not present

## 2022-02-13 DIAGNOSIS — J9601 Acute respiratory failure with hypoxia: Secondary | ICD-10-CM | POA: Diagnosis not present

## 2022-02-13 LAB — BLOOD GAS, ARTERIAL
Acid-Base Excess: 11.1 mmol/L — ABNORMAL HIGH (ref 0.0–2.0)
Bicarbonate: 40.7 mmol/L — ABNORMAL HIGH (ref 20.0–28.0)
O2 Saturation: 100 %
Patient temperature: 37
pCO2 arterial: 79 mmHg (ref 32–48)
pH, Arterial: 7.32 — ABNORMAL LOW (ref 7.35–7.45)
pO2, Arterial: 180 mmHg — ABNORMAL HIGH (ref 83–108)

## 2022-02-13 LAB — GLUCOSE, CAPILLARY
Glucose-Capillary: 150 mg/dL — ABNORMAL HIGH (ref 70–99)
Glucose-Capillary: 199 mg/dL — ABNORMAL HIGH (ref 70–99)

## 2022-02-13 MED ORDER — POLYVINYL ALCOHOL 1.4 % OP SOLN: 1.0000 [drp] | Freq: Four times a day (QID) | OPHTHALMIC | Status: DC | PRN

## 2022-02-13 MED ORDER — MORPHINE SULFATE (PF) 2 MG/ML IV SOLN
2.0000 mg | INTRAVENOUS | Status: DC
  Administered 2022-02-13 – 2022-02-14 (×4): 2 mg via INTRAVENOUS
  Filled 2022-02-13 (×4): qty 1

## 2022-02-13 MED ORDER — GLYCOPYRROLATE 0.2 MG/ML IJ SOLN: 0.4000 mg | INTRAMUSCULAR | Status: DC | PRN

## 2022-02-13 MED ORDER — BIOTENE DRY MOUTH MT LIQD: 15.0000 mL | OROMUCOSAL | Status: DC | PRN

## 2022-02-13 MED ORDER — LORAZEPAM 2 MG/ML IJ SOLN: 0.5000 mg | INTRAMUSCULAR | Status: DC | PRN

## 2022-02-13 MED ORDER — MORPHINE SULFATE (PF) 2 MG/ML IV SOLN
2.0000 mg | INTRAVENOUS | Status: DC | PRN
  Administered 2022-02-13 – 2022-02-14 (×2): 2 mg via INTRAVENOUS
  Filled 2022-02-13: qty 1

## 2022-02-13 NOTE — Progress Notes (Signed)
PROGRESS NOTE    Julie Norton  AJO:[id number] DOB: 10/14/1929 DOA: 02/28/2022 PCP: Eulas Post, MD    Brief Narrative:  88 year old female with history of chronic A-fib, history of breast cancer status post chemoradiation in 2014, dyspepsia, GERD, type 2 diabetes, hyperlipidemia, hypothyroidism, aortic stenosis status post TAVR presented to the emergency room with ongoing epigastric pain for last 2 to 3 weeks, weight loss, early satiety and lethargy.  Recently received Botox injection for torticollis on her neck.  Does not have a history of COPD.  In the emergency room she was afebrile, oxygen dropped to 75% requiring 4 L oxygen.  She was given fentanyl doses for abdominal pain and subsequently developed hypercapnia needing BiPAP and admission to ICU.  Chest x-ray also showed increased opacity of the left lung base thought to be atelectasis or pneumonia.  CT scan abdomen pelvis did not show any intra-abdominal pathology. 8/11, remains tired and lethargic.  Some improvement of CO2, however continues to remain in poor clinical status.   Assessment & Plan:   Acute on chronic hypercapnic and hypoxemic respiratory failure: Patient probably has chronic hypercapnic respiratory failure baseline. Admitted with CO2 retention and mental status changes improved with BiPAP overnight but recurred again.  Mental status fluctuates.  -keep on oxygen to keep saturation more than 90%.  Chest physiotherapy as much he can do it. -BiPAP as needed and at nights.  As tolerated.  See goal of care discussion below. -Treating as community-acquired pneumonia with Rocephin, cultures negative so far. -Speech therapy to rule out oropharyngeal dysphagia, aspiration pneumonia.  Atypical chest pain/epigastric pain/dyspepsia and early satiety: Significant symptoms. EGD and colonoscopy 2018 was essentially normal.  Also on anticoagulation.  Hemoglobin is stable. Abdominal CT scan was largely unremarkable.  Suspected  chronic mesenteric ischemia, however radiology recommended that CT angiogram will not add much to diagnosis. Trial of PPI.  Patient currently cannot undergo any procedure. Could not complete barium esophagogram.  Barium swallow study with oropharyngeal dysphagia.  Type 2 diabetes: On insulin at home.  Resume low-dose.  Hypothyroidism: On Synthroid.  Resume.  Chronic A-fib: Rate controlled.  Therapeutic on Eliquis.on metoprolol.  Goal of care discussion: Patient remained more lethargic.  Failure to thrive.  Now worsening mental status. Probably her failure to thrive is related to progressive dysphagia, may have radiation-induced dysphagia and odynophagia.  Possible underlying peptic ulcer disease. Called and discussed with patient's daughter, difficulty to treat conditions.  Family aware about her poor clinical status. Do not want any aggressive methods including surgery or transfer back to ICU. Wants to explore focusing on comfort. Agreeable to discuss with palliative care team today.  Will recommend comfort care and hospice if her mental status does not improve.   DVT prophylaxis: apixaban (ELIQUIS) tablet 2.5 mg Start: 02/11/22 1000 apixaban (ELIQUIS) tablet 2.5 mg   Code Status: DNR Family Communication: Daughter on the phone. Disposition Plan: Status is: Inpatient Remains inpatient appropriate because: Altered mental status.  IV antibiotics.     Consultants:  Critical care Palliative care   Procedures:  None  Antimicrobials:  Rocephin 8/9---   Subjective: Patient was seen and examined.  She was transferred to 4 N. overnight.  Looks very tired and difficult following commands today.  Looks anxious. Objective: Vitals:   02/13/22 0208 02/13/22 0302 02/13/22 0800 02/13/22 1100  BP:  (!) 142/71 131/76 129/70  Pulse:  83 86 80  Resp:  19 19 (!) 21  Temp:  98.4 F (36.9 C) 97.7 F (36.5  C) 98.5 F (36.9 C)  TempSrc:  Oral Oral Oral  SpO2:  100% 100% 100%  Weight:  49.1 kg     Height: '5\' 1"'$  (1.549 m)       Intake/Output Summary (Last 24 hours) at 02/13/2022 1326 Last data filed at 02/13/2022 0815 Gross per 24 hour  Intake 60 ml  Output 50 ml  Net 10 ml   Filed Weights   02/17/2022 1937 02/13/22 0208  Weight: 46.3 kg 49.1 kg    Examination:  General exam: Very frail and debilitated.  Sick looking. SpO2: 100 % O2 Flow Rate (L/min): 2 L/min FiO2 (%): 25 %  Respiratory system: Mostly conducted upper airway sounds.  Currently remains fairly comfortable on additional oxygen. Cardiovascular system: S1 & S2 heard, irregularly irregular. Gastrointestinal system: Abdomen is nondistended, soft and mild epigastric tenderness. No organomegaly or masses felt. Normal bowel sounds heard. Central nervous system: Sleepy and lethargic.  Generalized weakness.  No focal neurological deficits.  Flat affect and anxious.   Data Reviewed: I have personally reviewed following labs and imaging studies  CBC: Recent Labs  Lab 02/09/2022 1945 02/11/22 0153 02/11/22 0501 02/11/22 0831 02/11/22 1626 02/12/22 0835 02/12/22 1054  WBC 7.5  --   --   --   --  8.9  --   NEUTROABS 5.5  --   --   --   --   --   --   HGB 16.0*   < > 12.9 14.3 13.9 14.6 14.3  HCT 47.1*   < > 38.0 42.0 41.0 46.0 42.0  MCV 94.4  --   --   --   --  98.9  --   PLT 129*  --   --   --   --  123*  --    < > = values in this interval not displayed.   Basic Metabolic Panel: Recent Labs  Lab 02/26/2022 1945 02/11/22 0153 02/11/22 0501 02/11/22 0831 02/11/22 1040 02/11/22 1626 02/12/22 0835 02/12/22 1054  NA 141   < > 141 142  --  143 148* 142  K 4.3   < > 4.4 4.6  --  4.2 5.0 4.6  CL 102  --   --   --   --   --  107  --   CO2 32  --   --   --   --   --  33*  --   GLUCOSE 171*  --   --   --   --   --  141*  --   BUN 18  --   --   --   --   --  23  --   CREATININE 0.60  --   --   --   --   --  0.67  --   CALCIUM 10.1  --   --   --   --   --  10.3  --   MG  --   --   --   --  1.8  --   --    --    < > = values in this interval not displayed.   GFR: Estimated Creatinine Clearance: 36 mL/min (by C-G formula based on SCr of 0.67 mg/dL). Liver Function Tests: Recent Labs  Lab 02/25/2022 1945  AST 21  ALT 17  ALKPHOS 53  BILITOT 0.9  PROT 6.1*  ALBUMIN 3.6   Recent Labs  Lab 03/01/2022 1945  LIPASE 39   Recent Labs  Lab 02/11/22 0255  AMMONIA 24   Coagulation Profile: Recent Labs  Lab 02/11/22 0140  INR 1.4*   Cardiac Enzymes: No results for input(s): "CKTOTAL", "CKMB", "CKMBINDEX", "TROPONINI" in the last 168 hours. BNP (last 3 results) No results for input(s): "PROBNP" in the last 8760 hours. HbA1C: No results for input(s): "HGBA1C" in the last 72 hours. CBG: Recent Labs  Lab 02/12/22 1130 02/12/22 1648 02/12/22 2108 02/13/22 0808 02/13/22 1110  GLUCAP 155* 107* 152* 150* 199*   Lipid Profile: No results for input(s): "CHOL", "HDL", "LDLCALC", "TRIG", "CHOLHDL", "LDLDIRECT" in the last 72 hours. Thyroid Function Tests: No results for input(s): "TSH", "T4TOTAL", "FREET4", "T3FREE", "THYROIDAB" in the last 72 hours. Anemia Panel: No results for input(s): "VITAMINB12", "FOLATE", "FERRITIN", "TIBC", "IRON", "RETICCTPCT" in the last 72 hours. Sepsis Labs: Recent Labs  Lab 02/08/2022 1945 02/11/22 0140 02/11/22 0354  PROCALCITON <0.10  --   --   LATICACIDVEN  --  1.4 1.3    Recent Results (from the past 240 hour(s))  Urine Culture     Status: Abnormal   Collection Time: 02/11/22 12:22 AM   Specimen: In/Out Cath Urine  Result Value Ref Range Status   Specimen Description IN/OUT CATH URINE  Final   Special Requests   Final    NONE Performed at [facility], [address] [place], [place] [address]    Culture MULTIPLE SPECIES PRESENT, SUGGEST RECOLLECTION (A)  Final   Report Status 02/12/2022 FINAL  Final  Blood Culture (routine x 2)     Status: None (Preliminary result)   Collection Time: 02/11/22  1:40 AM   Specimen: BLOOD  Result  Value Ref Range Status   Specimen Description BLOOD SITE NOT SPECIFIED  Final   Special Requests   Final    BOTTLES DRAWN AEROBIC AND ANAEROBIC Blood Culture results may not be optimal due to an inadequate volume of blood received in culture bottles   Culture   Final    NO GROWTH 2 DAYS Performed at [facility], [place] [address] [place], [place] [address]    Report Status PENDING  Incomplete  Blood Culture (routine x 2)     Status: None (Preliminary result)   Collection Time: 02/11/22  1:40 AM   Specimen: BLOOD  Result Value Ref Range Status   Specimen Description BLOOD SITE NOT SPECIFIED  Final   Special Requests   Final    BOTTLES DRAWN AEROBIC AND ANAEROBIC Blood Culture results may not be optimal due to an inadequate volume of blood received in culture bottles   Culture   Final    NO GROWTH 2 DAYS Performed at [facility], [place] [address] [place],  [address]    Report Status PENDING  Incomplete  SARS Coronavirus 2 by RT PCR (hospital order, performed in [facility] hospital lab) *cepheid single result test* Anterior Nasal Swab     Status: None   Collection Time: 02/11/22  5:35 AM   Specimen: Anterior Nasal Swab  Result Value Ref Range Status   SARS Coronavirus 2 by RT PCR NEGATIVE NEGATIVE Final    Comment: (NOTE) SARS-CoV-2 target nucleic acids are NOT DETECTED.  The SARS-CoV-2 RNA is generally detectable in upper and lower respiratory specimens during the acute phase of infection. The lowest concentration of SARS-CoV-2 viral copies this assay can detect is 250 copies / mL. A negative result does not preclude SARS-CoV-2 infection and should not be used as the sole basis for treatment or  other patient management decisions.  A negative result may occur with improper specimen collection / handling, submission of specimen other than nasopharyngeal swab, presence of viral mutation(s) within the areas targeted by this assay, and inadequate number of viral  copies (<250 copies / mL). A negative result must be combined with clinical observations, patient history, and epidemiological information.  Fact Sheet for Patients:   https://www.patel.info/  Fact Sheet for Healthcare Providers: https://hall.com/  This test is not yet approved or  cleared by the [place] FDA and has been authorized for detection and/or diagnosis of SARS-CoV-2 by FDA under an Emergency Use Authorization (EUA).  This EUA will remain in effect (meaning this test can be used) for the duration of the COVID-19 declaration under Section 564(b)(1) of the Act, 21 U.S.C. section 360bbb-3(b)(1), unless the authorization is terminated or revoked sooner.  Performed at [facility], [place] [address], [place], [place] [address]   Culture, blood (Routine X 2) w Reflex to ID Panel     Status: None (Preliminary result)   Collection Time: 02/11/22 10:36 AM   Specimen: BLOOD  Result Value Ref Range Status   Specimen Description BLOOD SITE NOT SPECIFIED  Final   Special Requests   Final    BOTTLES DRAWN AEROBIC AND ANAEROBIC Blood Culture adequate volume   Culture   Final    NO GROWTH 2 DAYS Performed at [facility], [address], [place], [place] [address]    Report Status PENDING  Incomplete  Culture, blood (Routine X 2) w Reflex to ID Panel     Status: None (Preliminary result)   Collection Time: 02/11/22 10:42 AM   Specimen: BLOOD  Result Value Ref Range Status   Specimen Description BLOOD SITE NOT SPECIFIED  Final   Special Requests   Final    BOTTLES DRAWN AEROBIC AND ANAEROBIC Blood Culture results may not be optimal due to an inadequate volume of blood received in culture bottles   Culture   Final    NO GROWTH 2 DAYS Performed at [facility], [place] [address], [place], [place] [address]    Report Status PENDING  Incomplete  MRSA Next Gen by PCR, Nasal     Status: None   Collection Time: 02/11/22 12:29  PM   Specimen: Nasal Mucosa; Nasal Swab  Result Value Ref Range Status   MRSA by PCR Next Gen NOT DETECTED NOT DETECTED Final    Comment: (NOTE) The GeneXpert MRSA Assay (FDA approved for NASAL specimens only), is one component of a comprehensive MRSA colonization surveillance program. It is not intended to diagnose MRSA infection nor to guide or monitor treatment for MRSA infections. Test performance is not FDA approved in patients less than 77 years old. Performed at [facility], [place] [address], [place], [place] [address]          Radiology Studies: DG Swallowing Func-Speech Pathology  Result Date: 02/12/2022 Table formatting from the original result was not included. Images from the original result were not included. Objective Swallowing Evaluation: Type of Study: MBS-Modified Barium Swallow Study  Patient Details Name: ALEANA FIFITA MRN: [id number] Date of Birth: Dec 08, 1929 Today's Date: 02/12/2022 Time: SLP Start Time (ACUTE ONLY): 1400 -SLP Stop Time (ACUTE ONLY): 1430 SLP Time Calculation (min) (ACUTE ONLY): 30 min Past Medical History: Past Medical History: Diagnosis Date  Abdominal adhesions   Abdominal gas pain   suspected -- may be related to intermittent right upper quadrant discomfort radiating around  her back      Anemia   Anginal pain (HCC)   Arthritis   Atrial fibrillation (HCC)   Breast cancer (HCC)   Breast mass   left breast  Cancer (HCC)   left breast  Chest pain   intermittent right upper quadrant discomfort radiating around her back      Complication of anesthesia   Diabetes mellitus without complication (HCC)   Type II  Dyspepsia   Dyspnea   GERD (gastroesophageal reflux disease)   H/O: hysterectomy 1978  History of cardioversion   Hypercholesteremia   Hypothyroidism   Mitral insufficiency   Personal history of radiation therapy   Pneumonia   PONV (postoperative nausea and vomiting)   Right bundle branch block (RBBB)   Tendinitis   of the right arm and shoulder   Torn tendon   right shoulder Past Surgical History: Past Surgical History: Procedure Laterality Date  ABDOMINAL HYSTERECTOMY    APPENDECTOMY  1978  BLADDER REPAIR    BREAST LUMPECTOMY Left 02/24/2011  central partial mastectomy - dr Margot Chimes  BREAST REDUCTION SURGERY    CARDIOVERSION N/A 10/28/2017  Procedure: CARDIOVERSION;  Surgeon: Lelon Perla, MD;  Location: [place];  Service: Cardiovascular;  Laterality: N/A;  CARDIOVERSION N/A 04/06/2019  Procedure: CARDIOVERSION;  Surgeon: [name], MD;  Location: [facility] ENDOSCOPY;  Service: Cardiovascular;  Laterality: N/A;  CARDIOVERSION N/A 09/08/2019  Procedure: CARDIOVERSION;  Surgeon: Dorothy Spark, MD;  Location: [facility] ENDOSCOPY;  Service: Cardiovascular;  Laterality: N/A;  CARDIOVERSION N/A 08/28/2021  Procedure: CARDIOVERSION;  Surgeon: [name], MD;  Location: [place];  Service: Cardiovascular;  Laterality: N/A;  COLONOSCOPY WITH PROPOFOL N/A 03/24/2017  Procedure: COLONOSCOPY WITH PROPOFOL;  Surgeon: Wilford Corner, MD;  Location: [facility] ENDOSCOPY;  Service: Endoscopy;  Laterality: N/A;  ESOPHAGOGASTRODUODENOSCOPY (EGD) WITH PROPOFOL N/A 03/24/2017  Procedure: ESOPHAGOGASTRODUODENOSCOPY (EGD) WITH PROPOFOL;  Surgeon: Wilford Corner, MD;  Location: [place];  Service: Endoscopy;  Laterality: N/A;  LIPOMA EXCISION Right   REDUCTION MAMMAPLASTY Bilateral 1998  RETINAL DETACHMENT SURGERY Left   SALPINGOOPHORECTOMY    TEE WITHOUT CARDIOVERSION N/A 10/28/2017  Procedure: TRANSESOPHAGEAL ECHOCARDIOGRAM (TEE);  Surgeon: Lelon Perla, MD;  Location: [facility] ENDOSCOPY;  Service: Cardiovascular;  Laterality: N/A;  TONSILLECTOMY AND ADENOIDECTOMY  [address]15  at 88 years of age HPI: Levenia Skalicky Harman is a 88 y.o. female who presented with reports of epigastric pain over the last 2-3 weeks. She had associated symptoms of nausea, chest pain, fatigue, decreased p.o. intake, constipation, and shortness of breath with exertion.  Since December of last  year patient had lost over 35 pounds and has been drinking boost drinks over the last few months. Pt reports early satiety. Hx of breast cancer with radiation. Found to have acute respiratory failure with hypoxia and hypercapnia.  Chest x-ray gave concern for increased left lung opacity thought to be atelectasis versus pneumonia.  CT however did not note any lung abnormality. 2018 endoscopy shows normal esophagus.  No data recorded  Recommendations for follow up therapy are one component of a multi-disciplinary discharge planning process, led by the attending physician.  Recommendations may be updated based on patient status, additional functional criteria and insurance authorization. Assessment / Plan / Recommendation   02/12/2022   3:00 PM Clinical Impressions Clinical Impression Pt demonstrates severe esophageal dysphagia and moderate oropharyngeal impairment. Pt has generalized weakness impacting labial seal and lingual control with anterior spillage of PO and moderate oral residue. She is unable to sip from a straw. Pharyngeal dysphagia characterized by  delayed swallow initiation, decreased epiglottic deflection and poor contraction of the lower pharyngeal constrictors. There is sensed penetration of nectar thick liquids with ejection and moderate pyriform sinus residue also with nectar. Pt is able to acheive better bolus cohesion and pharyngeal transit with honey thick liquids and puree. Esophageal sweep shows severe stasis after only liquids in the esophageal column. Pt Recommended to attempt honey thick liquids, pureed solids (due to generalized oral weakness as well as esophageal stasis). Pt also recommended to attempt to increase intake by constantly snacking on calorie dense moist soft foods and drinks rather than trying to eat full meals, which is not realistic given severy of esopahgeal stasis. Pt would have potential to upgrade if generalized strength improved. Doubtful that pt would be capable of  completing an esophagram given severity of oral and pharyngeal dysphagia. Discussed with pt and daughter. Will f/u. SLP Visit Diagnosis Dysphagia, pharyngoesophageal phase (R13.14);Dysphagia, oral phase (R13.11) Impact on safety and function Risk for inadequate nutrition/hydration;Moderate aspiration risk     02/12/2022   3:00 PM Treatment Recommendations Treatment Recommendations Therapy as outlined in treatment plan below     02/12/2022   3:00 PM Prognosis Prognosis for Safe Diet Advancement Good   02/12/2022   3:00 PM Diet Recommendations SLP Diet Recommendations Dysphagia 1 (Puree) solids;Honey thick liquids Liquid Administration via Spoon;Cup Medication Administration Crushed with puree Compensations Slow rate;Small sips/bites Postural Changes Seated upright at 90 degrees;Remain semi-upright after after feeds/meals (Comment)     02/12/2022   3:00 PM Other Recommendations Oral Care Recommendations Oral care BID Other Recommendations Have oral suction available Follow Up Recommendations Skilled nursing-short term rehab (<3 hours/day) Assistance recommended at discharge Frequent or constant Supervision/Assistance Functional Status Assessment Patient has had a recent decline in their functional status and demonstrates the ability to make significant improvements in function in a reasonable and predictable amount of time.   02/12/2022   3:00 PM Frequency and Duration  Speech Therapy Frequency (ACUTE ONLY) min 2x/week Treatment Duration 2 weeks     02/12/2022   3:00 PM Oral Phase Oral Phase Impaired Oral - Honey Teaspoon Decreased bolus cohesion;Premature spillage;Delayed oral transit;Lingual/palatal residue;Weak lingual manipulation;Right anterior bolus loss;Left anterior bolus loss Oral - Honey Cup Decreased bolus cohesion;Premature spillage;Delayed oral transit;Lingual/palatal residue;Weak lingual manipulation;Right anterior bolus loss;Left anterior bolus loss Oral - Nectar Teaspoon Decreased bolus cohesion;Premature  spillage;Delayed oral transit;Lingual/palatal residue;Weak lingual manipulation;Right anterior bolus loss;Left anterior bolus loss Oral - Nectar Cup Decreased bolus cohesion;Premature spillage;Delayed oral transit;Lingual/palatal residue;Weak lingual manipulation;Right anterior bolus loss;Left anterior bolus loss Oral - Nectar Straw Decreased bolus cohesion;Premature spillage;Delayed oral transit;Lingual/palatal residue;Weak lingual manipulation;Right anterior bolus loss;Left anterior bolus loss Oral - Puree Decreased bolus cohesion;Premature spillage;Delayed oral transit;Lingual/palatal residue;Weak lingual manipulation;Right anterior bolus loss;Left anterior bolus loss Oral - Mech Soft Decreased bolus cohesion;Premature spillage;Delayed oral transit;Lingual/palatal residue;Weak lingual manipulation;Right anterior bolus loss;Left anterior bolus loss    02/12/2022   3:00 PM Pharyngeal Phase Pharyngeal Phase Impaired Pharyngeal- Honey Teaspoon Reduced epiglottic inversion;Reduced pharyngeal peristalsis;Pharyngeal residue - valleculae;Pharyngeal residue - pyriform;Lateral channel residue Pharyngeal- Honey Cup Reduced epiglottic inversion;Reduced pharyngeal peristalsis;Pharyngeal residue - valleculae;Pharyngeal residue - pyriform;Lateral channel residue Pharyngeal- Nectar Teaspoon Reduced epiglottic inversion;Reduced pharyngeal peristalsis;Pharyngeal residue - valleculae;Pharyngeal residue - pyriform;Lateral channel residue Pharyngeal- Nectar Cup Reduced epiglottic inversion;Reduced pharyngeal peristalsis;Pharyngeal residue - valleculae;Pharyngeal residue - pyriform;Lateral channel residue;Penetration/Aspiration before swallow;Penetration/Aspiration during swallow Pharyngeal Material enters airway, CONTACTS cords and then ejected out Pharyngeal- Nectar Straw Reduced epiglottic inversion;Reduced pharyngeal peristalsis;Pharyngeal residue - valleculae;Pharyngeal residue - pyriform;Lateral channel  residue;Penetration/Aspiration before swallow  Pharyngeal Material enters airway, CONTACTS cords and then ejected out Pharyngeal- Puree Delayed swallow initiation-pyriform sinuses;Reduced epiglottic inversion;Reduced pharyngeal peristalsis;Lateral channel residue Pharyngeal- Mechanical Soft Reduced epiglottic inversion;Reduced pharyngeal peristalsis;Lateral channel residue     No data to display    DeBlois, Katherene Ponto 02/12/2022, 3:54 PM                     ECHOCARDIOGRAM COMPLETE  Result Date: 02/11/2022    ECHOCARDIOGRAM REPORT   Patient Name:   KAMONI DEPREE Date of Exam: 02/11/2022 Medical Rec #:  [id number]         Height:       61.0 in Accession #:    [id number]        Weight:       102.0 lb Date of Birth:  [birth year]         BSA:          1.419 m Patient Age:    25 years          BP:           140/66 mmHg Patient Gender: F                 HR:           70 bpm. Exam Location:  Inpatient Procedure: 2D Echo, Cardiac Doppler and Color Doppler Indications:    Abnormal ECG  History:        Patient has prior history of Echocardiogram examinations, most                 recent 09/14/2020. Arrythmias:Atrial Fibrillation; Risk                 Factors:Diabetes and Dyslipidemia.  Sonographer:    Maudry Mayhew MHA, RDMS, RVT, RDCS Referring Phys: [place] Comments: On C-PAP. IMPRESSIONS  1. Left ventricular ejection fraction by 3D volume is 51 %. The left ventricle has low normal function. The left ventricle has no regional wall motion abnormalities. Left ventricular diastolic parameters are indeterminate.  2. Right ventricular systolic function is normal. The right ventricular size is normal.  3. Left atrial size was mildly dilated.  4. Right atrial size was mildly dilated.  5. The mitral valve is normal in structure. Trivial mitral valve regurgitation. No evidence of mitral stenosis.  6. Tricuspid valve regurgitation is moderate.  7. The aortic valve is tricuspid. Aortic valve  regurgitation is not visualized. No aortic stenosis is present.  8. The inferior vena cava is normal in size with greater than 50% respiratory variability, suggesting right atrial pressure of 3 mmHg. FINDINGS  Left Ventricle: Left ventricular ejection fraction by 3D volume is 51 %. The left ventricle has low normal function. The left ventricle has no regional wall motion abnormalities. The left ventricular internal cavity size was normal in size. There is no left ventricular hypertrophy. Left ventricular diastolic parameters are indeterminate. Right Ventricle: The right ventricular size is normal. No increase in right ventricular wall thickness. Right ventricular systolic function is normal. Left Atrium: Left atrial size was mildly dilated. Right Atrium: Right atrial size was mildly dilated. Pericardium: There is no evidence of pericardial effusion. Mitral Valve: The mitral valve is normal in structure. Trivial mitral valve regurgitation. No evidence of mitral valve stenosis. Tricuspid Valve: The tricuspid valve is normal in structure. Tricuspid valve regurgitation is moderate . No evidence of tricuspid stenosis. Aortic Valve: The aortic valve is tricuspid. Aortic valve regurgitation is not  visualized. No aortic stenosis is present. Aortic valve mean gradient measures 2.0 mmHg. Aortic valve peak gradient measures 3.2 mmHg. Aortic valve area, by VTI measures 2.00 cm. Pulmonic Valve: The pulmonic valve was normal in structure. Pulmonic valve regurgitation is not visualized. No evidence of pulmonic stenosis. Aorta: The aortic root is normal in size and structure. Venous: The inferior vena cava is normal in size with greater than 50% respiratory variability, suggesting right atrial pressure of 3 mmHg. IAS/Shunts: No atrial level shunt detected by color flow Doppler.  LEFT VENTRICLE PLAX 2D LVIDd:         3.95 cm LVIDs:         2.60 cm LV PW:         0.75 cm         3D Volume EF LV IVS:        0.85 cm         LV 3D EF:     Left LVOT diam:     1.60 cm                      ventricul LV SV:         31                           ar LV SV Index:   22                           ejection LVOT Area:     2.01 cm                     fraction                                             by 3D                                             volume is LV Volumes (MOD)                            51 %. LV vol d, MOD    65.3 ml A2C: LV vol d, MOD    28.7 ml       3D Volume EF: A4C:                           3D EF:        51 % LV vol s, MOD    30.4 ml       LV EDV:       98 ml A2C:                           LV ESV:       47 ml LV vol s, MOD    9.8 ml        LV SV:        50 ml A4C: LV SV MOD A2C:   34.9 ml LV SV MOD A4C:   28.7 ml LV SV MOD BP:  27.5 ml RIGHT VENTRICLE RV Basal diam:  4.20 cm RV Mid diam:    4.30 cm RV S prime:     11.70 cm/s TAPSE (M-mode): 1.4 cm LEFT ATRIUM             Index        RIGHT ATRIUM           Index LA diam:        3.30 cm 2.33 cm/m   RA Area:     17.00 cm LA Vol (A2C):   55.2 ml 38.91 ml/m  RA Volume:   44.90 ml  31.65 ml/m LA Vol (A4C):   44.4 ml 31.30 ml/m LA Biplane Vol: 50.7 ml 35.74 ml/m  AORTIC VALVE AV Area (Vmax):    1.86 cm AV Area (Vmean):   1.82 cm AV Area (VTI):     2.00 cm AV Vmax:           90.10 cm/s AV Vmean:          62.700 cm/s AV VTI:            0.155 m AV Peak Grad:      3.2 mmHg AV Mean Grad:      2.0 mmHg LVOT Vmax:         83.40 cm/s LVOT Vmean:        56.600 cm/s LVOT VTI:          0.154 m LVOT/AV VTI ratio: 0.99  AORTA Ao Root diam: 3.20 cm MR Peak grad:    31.6 mmHg    TRICUSPID VALVE MR Vmax:         281.00 cm/s  TR Peak grad:   31.8 mmHg MR PISA:         0.57 cm     TR Vmax:        282.00 cm/s MR PISA Eff ROA: 6 mm MR PISA Radius:  0.30 cm      SHUNTS                               Systemic VTI:  0.15 m                               Systemic Diam: 1.60 cm Kardie Tobb DO Electronically signed by Berniece Salines DO Signature Date/Time: 02/11/2022/5:02:34 PM    Final         Scheduled  Meds:  apixaban  2.5 mg Oral BID   Chlorhexidine Gluconate Cloth  6 each Topical Daily   insulin aspart  0-15 Units Subcutaneous TID WC   insulin glargine-yfgn  10 Units Subcutaneous QHS   levothyroxine  50 mcg Oral Q0600   metoprolol succinate  12.5 mg Oral Daily   mouth rinse  15 mL Mouth Rinse 4 times per day   pantoprazole  40 mg Oral BID   sodium chloride flush  3 mL Intravenous Q12H   traZODone  50 mg Oral QHS   Continuous Infusions:  cefTRIAXone (ROCEPHIN)  IV 1 g (02/13/22 0505)     LOS: 2 days    Time spent: 35 minutes    Barb Merino, MD [facility] Pager [phone]

## 2022-02-13 NOTE — Progress Notes (Signed)
Contacted daughter Clarene Critchley) to notify her of patient's new room number.

## 2022-02-13 NOTE — Progress Notes (Signed)
Speech Language Pathology Treatment: Dysphagia  Patient Details Name: Julie Norton MRN: [id number] DOB: 09-Nov-1929 Today's Date: 02/13/2022 Time: 4270-6237 SLP Time Calculation (min) (ACUTE ONLY): 15 min  Assessment / Plan / Recommendation Clinical Impression  Pt demonstrates worsened function today, she is awake but she is very weak. RN reports she accepted only a bit and sip of breakfast. SLP repositioned pt upright and feb pt teaspoons of honey thick ensure. Pt unable to achieve labial closure with anterior spillage and pooling of fluid around her dentures. Pt needed cues to even attempt to orally manipulate the bolus. Eventually pt able to initiate a swallow but there is still severe oral residue. Also attempted a teaspoon of magic cup with improved control of thicker texture, but still prolonged oral phase. Pt has no interest in oral intake and is not strong enough for adequate intake. Discussed with MD and RN. Placed sign with some recommendations if pt were to be more capable at another time.    HPI HPI: NALA Julie Norton is a 88 y.o. female who presented with reports of epigastric pain over the last 2-3 weeks. She had associated symptoms of nausea, chest pain, fatigue, decreased p.o. intake, constipation, and shortness of breath with exertion.  Since December of last year patient had lost over 35 pounds and has been drinking boost drinks over the last few months. Pt reports early satiety. Hx of breast cancer with radiation. Found to have acute respiratory failure with hypoxia and hypercapnia.  Chest x-ray gave concern for increased left lung opacity thought to be atelectasis versus pneumonia.  CT however did not note any lung abnormality. 2018 endoscopy shows normal esophagus.      SLP Plan  Continue with current plan of care      Recommendations for follow up therapy are one component of a multi-disciplinary discharge planning process, led by the attending physician.  Recommendations  may be updated based on patient status, additional functional criteria and insurance authorization.    Recommendations  Diet recommendations: Honey-thick liquid;Dysphagia 1 (puree) Liquids provided via: Teaspoon Medication Administration: Crushed with puree Supervision: Full supervision/cueing for compensatory strategies Compensations: Slow rate;Small sips/bites Postural Changes and/or Swallow Maneuvers: Seated upright 90 degrees                Follow Up Recommendations: Skilled nursing-short term rehab (<3 hours/day) Assistance recommended at discharge: Frequent or constant Supervision/Assistance SLP Visit Diagnosis: Dysphagia, pharyngoesophageal phase (R13.14);Dysphagia, oral phase (R13.11) Plan: Continue with current plan of care           Antoneo Ghrist, Katherene Ponto  02/13/2022, 12:14 PM

## 2022-02-13 NOTE — Progress Notes (Signed)
PT Cancellation Note  Patient Details Name: Julie Norton MRN: [id number] DOB: [birth year]   Cancelled Treatment:    Reason Eval/Treat Not Completed: Medical issues which prohibited therapy (Pt struggling to breathe on arrival to room.  Daughter asked for PT to return at later date.  Family is considering comfort care.  Will check back Monday.)   Alvira Philips 02/13/2022, 1:47 PM Ordean Fouts M,PT Acute Rehab Services [phone]

## 2022-02-13 NOTE — Consult Note (Signed)
Palliative Medicine Inpatient Consult Note  Consulting Provider:  Barb Merino, MD  Reason for consult:   Julie Norton Palliative Medicine Consult  Reason for Consult? severe medical condition, goal of care   02/13/2022  HPI:  Per intake H&P --> 88 year old female with history of chronic A-fib, history of breast cancer status post chemoradiation in 2014, dyspepsia, GERD, type 2 diabetes, hyperlipidemia, hypothyroidism, aortic stenosis status post TAVR presented to the emergency room with ongoing epigastric pain for last 2 to 3 weeks, weight loss, early satiety and lethargy.  Recently received Botox injection for torticollis on her neck. Does not have a history of COPD.  Palliative care asked to consult for conversation regarding failure to thrive.   Clinical Assessment/Goals of Care:  *Please note that this is a verbal dictation therefore any spelling or grammatical errors are due to the "[place]" system interpretation.  I have reviewed medical records including EPIC notes, labs and imaging, received report from bedside RN, assessed the patient.    I met with patients daughter, Julie Norton  to further discuss diagnosis prognosis, GOC, EOL wishes, disposition and options.   I introduced Palliative Medicine as specialized medical care for people living with serious illness. It focuses on providing relief from the symptoms and stress of a serious illness. The goal is to improve quality of life for both the patient and the family.  Medical History Review and Understanding:  A brief review of patients medical history was had in the setting of her malnutrition, Afib, diabetes, and congestive heart failure.   Social History:  Julie Norton is from [place], [place]. She was married to her husband for 6 years. He passed away roughly one year ago. They have two children, six grandchildren, and four great grandchildren. 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  Julie Norton is identified as someone who absolutely loves to work and educate herself.  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  Functional and Nutritional State:  Prior to admission Julie Norton was living by herself in a 4000 foot square foot house and functioning independently.  Julie Norton has had a diminishing appetite over the last 8 months or so and has lost roughly 35 pounds.  Advance Directives:  A detailed discussion was had today regarding advanced directives.  Patient's children Julie Norton and Julie Norton are her surrogate decision makers.  Code Status:  Confirmed that patient is a DO NOT RESUSCITATE/DO NOT INTUBATE CODE STATUS.  Discussion:  A discussion was held about Julie Norton declining health state since December 2022.  She had begun to have ankle swelling and was taking large doses of diuretic per her daughter she had never stopped that and during that time lost a significant amount of weight.  She had from there started to have more of a head drop and a loss of her voice.  Although she was still functional she had made multiple statements to her family about missing her husband and feeling "so tired".  We reviewed Julie Norton's present health state and that she is truly not thriving.  Patient's daughter was very clear that she would not wish for her mother to suffer and thinks in the setting of her recent swallow evaluation  which indicated esophageal dysmotility that her mother would wish for a comfort oriented path of care. We talked about transition to comfort measures in house and what that would entail inclusive of medications to control pain, dyspnea, agitation, nausea, itching, and hiccups.  We discussed stopping  all uneccessary measures such as cardiac monitoring, blood draws, needle sticks, and frequent vital signs. Utilized reflective listening throughout our time together.   We discussed that at this point in time Julie Norton would remain in the hospital for about 2 days of observation as patient's family had a poor experience when her husband went home he barely survived the ambulance ride.  I shared that our goal will be to optimize Julie Norton's comfort and reassess where she is in the death and dying process in the oncoming days.  Julie Norton was able to call her brother check on speaker phone and he was able to confirm the goal for comfort oriented care.  Discussed the importance of continued conversation with family and their  medical providers regarding overall plan of care and treatment options, ensuring decisions are within the context of the patients values and GOCs.  Decision Maker: Julie Norton (Daughter): [phone] (Mobile)  SUMMARY OF RECOMMENDATIONS   DNAR/DNI  Comfort care  Added low dose morphine Q4H for symptoms relief - pain in BLE  Unrestricted visitation  Plan to watch patient over the weekend to see how symptoms are managed  If stable on Monday can discuss possible transfer  Ongoing Palliative Support  Code Status/Advance Care Planning: DNAR/DNI   Palliative Prophylaxis:  Aspiration, Bowel Regimen, Delirium Protocol, Frequent Pain Assessment, Oral Care, Palliative Wound Care, and Turn Reposition  Additional Recommendations (Limitations, Scope, Preferences): Continue current comfort oriented care  Psycho-social/Spiritual:  Desire for further Chaplaincy support: Yes Additional Recommendations: Education on end of life process   Prognosis: Limited to days.  Discharge Planning: Discharge will likely be celestial.   Vitals:   02/13/22 0800 02/13/22 1100  BP: 131/76 129/70  Pulse: 86 80  Resp: 19 (!) 21  Temp: 97.7 F (36.5 C) 98.5 F (36.9 C)  SpO2: 100% 100%     Intake/Output Summary (Last 24 hours) at 02/13/2022 1303 Last data filed at 02/13/2022 0815 Gross per 24 hour  Intake 60 ml  Output 50 ml  Net 10 ml   Last Weight  Most recent update: 02/13/2022  2:08 AM    Weight  49.1 kg (108 lb 3.9 oz)            Gen:  Frail elderly, Caucasian F in NAD HEENT: moist mucous membranes CV: Regular rate and rhythm  PULM: On 2LPM [place], breathing is even and nonlabored ABD: (+) mid abdominal tenderness EXT: No edema Neuro: Alert and oriented  to self  PPS: 10%   This conversation/these recommendations were discussed with patient primary care team, Dr. Sloan Leiter  Billing based on MDM: High  Problems Addressed: One acute or chronic illness or injury that poses a threat to life or bodily function  Amount and/or Complexity of Data: Category 3:Discussion of management or test interpretation with external physician/other qualified health care professional/appropriate source (not separately reported)  Risks: Decision not to resuscitate or to de-escalate care because of poor prognosis ______________________________________________________ [place] Team Team Cell Phone: [phone] Please utilize secure chat with additional questions, if there is no response within 30 minutes please call the above phone number  Palliative Medicine Team providers are available by phone from 7am to 7pm daily and can be reached  through the team cell phone.  Should this patient require assistance outside of these hours, please call the patient's attending physician.

## 2022-02-13 NOTE — Care Management Important Message (Signed)
Important Message  Patient Details  Name: Julie Norton MRN: [id number] Date of Birth: 12/30/1929   Medicare Important Message Given:  Yes     Hannah Beat 02/13/2022, 2:37 PM

## 2022-02-13 NOTE — TOC Progression Note (Signed)
Transition of Care [facility]) - Progression Note    Patient Details  Name: Julie Norton MRN: [id number] Date of Birth: [birth year]  Transition of Care [facility]) CM/SW [place], RN Phone Number:[phone]  02/13/2022, 3:50 PM  Clinical Narrative:    TOC acknowledges SNF recommendation. TOC following for SNF workup.        Expected Discharge Plan and Services                                                 Social Determinants of Health (SDOH) Interventions    Readmission Risk Interventions     No data to display

## 2022-02-13 NOTE — Progress Notes (Signed)
RT attempted NIF 4X and pt's effort was poor. MD aware. RT will monitor.

## 2022-02-14 DIAGNOSIS — Z7189 Other specified counseling: Secondary | ICD-10-CM | POA: Diagnosis not present

## 2022-02-14 DIAGNOSIS — Z515 Encounter for palliative care: Secondary | ICD-10-CM | POA: Diagnosis not present

## 2022-02-14 DIAGNOSIS — R627 Adult failure to thrive: Secondary | ICD-10-CM | POA: Diagnosis present

## 2022-02-16 ENCOUNTER — Telehealth: Payer: Self-pay | Admitting: Licensed Clinical Social Worker

## 2022-02-16 LAB — CULTURE, BLOOD (ROUTINE X 2)
Culture: NO GROWTH
Culture: NO GROWTH
Culture: NO GROWTH
Special Requests: ADEQUATE

## 2022-02-16 NOTE — Telephone Encounter (Signed)
    Clinical Social Work  Care Management   Phone Outreach    02/16/2022 Name: Julie Norton MRN: [id number] DOB: [birth year]  Julie Norton is a 88 y.o. year old female who is a primary care patient of Burchette, Alinda Sierras, MD .   Reason for referral: [employer] .    F/U phone call today to assess needs, progress and barriers with care plan goals.   Telephone outreach was unsuccessful. A HIPPA compliant phone message was left for the patient providing contact information and requesting a return call.   Plan:CCM LCSW will wait for return call. Per chart review, pt has passed away. LCSW will remove self from care team  Review of patient status, including review of consultants reports, relevant laboratory and other test results, and collaboration with appropriate care team members and the patient's provider was performed as part of comprehensive patient evaluation and provision of care management services.    Christa See, MSW, [email or web address] Phone [phone] 8:24 AM

## 2022-02-17 LAB — CULTURE, BLOOD (ROUTINE X 2)

## 2022-03-03 ENCOUNTER — Ambulatory Visit: Payer: Medicare Other | Admitting: Dietician

## 2022-03-06 NOTE — Progress Notes (Addendum)
This nurse took care of this patient for her last hours. She was put on comfort care for failure to thrive. NP added morphine to [facility] for c/o of leg pain in order to keep her comfortable. Family was very present in her final moments. Comfort tray refreshments and tissues were given. This nurse was called in the room by the daughter were I confirmed along with another nurse that the patient has passed away at 1110. Building surveyor were called, they declined organ donation and Medical Examiner signed off. Family was given time to grieve with patient. [place] [phone] was the patients wishes on what funeral home she wanted and this information was provided by the daughter.

## 2022-03-06 NOTE — Progress Notes (Signed)
   Palliative Medicine Inpatient Follow Up Note   HPI: 88 year old female with history of chronic A-fib, history of breast cancer status post chemoradiation in 2014, dyspepsia, GERD, type 2 diabetes, hyperlipidemia, hypothyroidism, aortic stenosis status post TAVR presented to the emergency room with ongoing epigastric pain for last 2 to 3 weeks, weight loss, early satiety and lethargy.  Recently received Botox injection for torticollis on her neck. Does not have a history of COPD.  Palliative care asked to consult for conversation regarding failure to thrive.   Today's Discussion 02-28-22  *Please note that this is a verbal dictation therefore any spelling or grammatical errors are due to the "[place] One" system interpretation.  Chart reviewed inclusive of vital signs, progress notes, laboratory results, and diagnostic images.   (+) 3 doses of morphine since yesterday afternoon.   I met with Julie Norton at bedside this morning. She was moving her arms and staring up. She was able to mumble a few indiscernible words when I spoke to her. She appeared agitated. I spoke to patients RN, [place]. We established that she may be uncomfortable and determined a dose of morphine should be given. She was weaned off of her O2. Music was started at bedside for therapeutic purposes. Nursing questions addressed.   Goals remain towards comfort.   Objective Assessment: Vital Signs Vitals:   28-Feb-2022 0003 02/28/22 0828  BP: (!) 92/51   Pulse: 73   Resp: 11   Temp: 97.6 F (36.4 C) (!) 96.8 F (36 C)  SpO2: 100%    No intake or output data in the 24 hours ending 28-Feb-2022 0951 Last Weight  Most recent update: 02/13/2022  2:08 AM    Weight  49.1 kg (108 lb 3.9 oz)            Gen:  Frail elderly, Caucasian F in NAD HEENT: moist mucous membranes CV: Regular rate and rhythm  PULM: On RAbreathing is even and nonlabored ABD: (+) mid abdominal tenderness EXT: No edema Neuro:  Somnolent  SUMMARY OF RECOMMENDATIONS   DNAR/DNI   Comfort care   Added low dose morphine Q4H for symptoms relief - pain in BLE   Unrestricted visitation   Plan to watch patient over the weekend to see how symptoms are managed   If stable on Monday can discuss possible transfer   Ongoing Palliative Support  Prognosis hours to days  Billing based on MDM: High  Problems Addressed: One acute or chronic illness or injury that poses a threat to life or bodily function  Amount and/or Complexity of Data: Category 3:Discussion of management or test interpretation with external physician/other qualified health care professional/appropriate source (not separately reported)  Risks: Parenteral controlled substances and Decision not to resuscitate or to de-escalate care because of poor prognosis ______________________________________________________________________________________ [place] Team Team Cell Phone: [phone] Please utilize secure chat with additional questions, if there is no response within 30 minutes please call the above phone number  Palliative Medicine Team providers are available by phone from 7am to 7pm daily and can be reached through the team cell phone.  Should this patient require assistance outside of these hours, please call the patient's attending physician.

## 2022-03-06 NOTE — Death Summary Note (Signed)
Death Summary  Julie Norton OFB:[id number] DOB: 01/13/1930 DOA: Feb 11, 2022  PCP: Eulas Post, MD PCP/Office notified: electronic   Admit date: 02-11-2022 Date of Death: 02-15-22, 7 AM  Final Diagnoses:  Principal Problem:   Acute respiratory failure with hypoxia and hypercapnia (HCC) Active Problems:   PNA (pneumonia)   Abdominal pain   Chest pain of unknown etiology   Persistent atrial fibrillation ([place])   Hypothyroidism   Poorly controlled type 2 diabetes mellitus ([place])   GERD (gastroesophageal reflux disease)   Torticollis   History of breast cancer   Thrombocytopenia (HCC)   Pressure injury of skin   End of life care   Failure to thrive in adult    Acute respiratory failure with hypoxia and hypercapnia secondary to bilateral pneumonia. Dysphagia secondary to radiation treatment Failure to thrive  History of present illness:  Patient has history of chronic A-fib, history of breast cancer status post chemoradiation in 2014, dyspepsia, GERD, type 2 diabetes and multiple other medical issues, she also had TAVR presented to the emergency room with epigastric pain for 2 to 3 weeks, weight loss, early satiety, lethargy.  In the emergency room she was found hypoxemic with 75% on room air, also developed hypercapnic failure after receiving a pain medication and started on BiPAP and admitted to ICU.  Chest x-ray showed increased opacity left lung base thought to be atelectasis or pneumonia.  CT scan abdomen pelvis did not show any intra-abdominal pathology.  Patient remained in very poor clinical status, remained tired and lethargic.  Hospital Course:  Patient was treated with oxygen, BiPAP support that she was unable to tolerate ultimately, antibiotics and maintenance IV fluids. Patient remained in poor clinical status, lethargic, uncomfortable and not tolerating oral intake.  She had developed significant oropharyngeal dysphagia and aspiration pneumonia. As she was not  improving with adequate treatment, shared decision was made with patient's family and she was converted to comfort care and hospice.  She was provided end-of-life care in the hospital.  She died peacefully in the hospital today.   Time: 11:10 AM  Signed:  Barb Merino  [facility] 2022/02/15, 11:58 AM

## 2022-03-06 DEATH — deceased

## 2022-03-20 ENCOUNTER — Ambulatory Visit: Payer: Medicare Other | Admitting: Family Medicine

## 2022-04-15 ENCOUNTER — Telehealth: Payer: Medicare Other

## 2022-04-17 ENCOUNTER — Ambulatory Visit: Payer: Medicare Other | Admitting: Physical Medicine and Rehabilitation

## 2022-04-20 ENCOUNTER — Ambulatory Visit: Payer: Medicare Other | Admitting: Physical Medicine and Rehabilitation
